# Patient Record
Sex: Female | Born: 1935 | Race: White | Hispanic: No | Smoking: Former smoker
Health system: Southern US, Community
[De-identification: ages and names within clinical notes are randomized; demographics above are authoritative.]

## PROBLEM LIST (undated history)

## (undated) DIAGNOSIS — I119 Hypertensive heart disease without heart failure: Secondary | ICD-10-CM

## (undated) DIAGNOSIS — D591 Autoimmune hemolytic anemia, unspecified: Secondary | ICD-10-CM

## (undated) DIAGNOSIS — I10 Essential (primary) hypertension: Secondary | ICD-10-CM

## (undated) DIAGNOSIS — E039 Hypothyroidism, unspecified: Secondary | ICD-10-CM

## (undated) DIAGNOSIS — R918 Other nonspecific abnormal finding of lung field: Secondary | ICD-10-CM

## (undated) DIAGNOSIS — M4800 Spinal stenosis, site unspecified: Secondary | ICD-10-CM

## (undated) DIAGNOSIS — E669 Obesity, unspecified: Secondary | ICD-10-CM

## (undated) DIAGNOSIS — R0600 Dyspnea, unspecified: Secondary | ICD-10-CM

## (undated) DIAGNOSIS — D375 Neoplasm of uncertain behavior of rectum: Secondary | ICD-10-CM

## (undated) DIAGNOSIS — M5136 Other intervertebral disc degeneration, lumbar region: Secondary | ICD-10-CM

## (undated) DIAGNOSIS — H3411 Central retinal artery occlusion, right eye: Secondary | ICD-10-CM

## (undated) DIAGNOSIS — I350 Nonrheumatic aortic (valve) stenosis: Secondary | ICD-10-CM

## (undated) DIAGNOSIS — I4891 Unspecified atrial fibrillation: Secondary | ICD-10-CM

## (undated) DIAGNOSIS — M51369 Other intervertebral disc degeneration, lumbar region without mention of lumbar back pain or lower extremity pain: Secondary | ICD-10-CM

## (undated) DIAGNOSIS — E785 Hyperlipidemia, unspecified: Secondary | ICD-10-CM

## (undated) DIAGNOSIS — D62 Acute posthemorrhagic anemia: Secondary | ICD-10-CM

## (undated) DIAGNOSIS — I2510 Atherosclerotic heart disease of native coronary artery without angina pectoris: Secondary | ICD-10-CM

## (undated) DIAGNOSIS — M519 Unspecified thoracic, thoracolumbar and lumbosacral intervertebral disc disorder: Secondary | ICD-10-CM

## (undated) HISTORY — DX: Unspecified atrial fibrillation: I48.91

## (undated) HISTORY — DX: Hypertensive heart disease without heart failure: I11.9

## (undated) HISTORY — DX: Other intervertebral disc degeneration, lumbar region: M51.36

## (undated) HISTORY — DX: Nonrheumatic aortic (valve) stenosis: I35.0

## (undated) HISTORY — PX: LUMBAR LAMINECTOMY: SHX95

## (undated) HISTORY — DX: Dyspnea, unspecified: R06.00

## (undated) HISTORY — DX: Other intervertebral disc degeneration, lumbar region without mention of lumbar back pain or lower extremity pain: M51.369

## (undated) HISTORY — PX: HERNIA REPAIR: SHX51

## (undated) HISTORY — DX: Hyperlipidemia, unspecified: E78.5

## (undated) HISTORY — PX: CHOLECYSTECTOMY: SHX55

## (undated) HISTORY — DX: Central retinal artery occlusion, right eye: H34.11

## (undated) HISTORY — DX: Essential (primary) hypertension: I10

## (undated) HISTORY — DX: Autoimmune hemolytic anemia, unspecified: D59.10

## (undated) HISTORY — DX: Hypothyroidism, unspecified: E03.9

## (undated) HISTORY — DX: Obesity, unspecified: E66.9

## (undated) HISTORY — DX: Unspecified thoracic, thoracolumbar and lumbosacral intervertebral disc disorder: M51.9

## (undated) HISTORY — DX: Spinal stenosis, site unspecified: M48.00

---

## 2000-10-18 ENCOUNTER — Encounter: Payer: Self-pay | Admitting: Emergency Medicine

## 2000-10-18 ENCOUNTER — Emergency Department (HOSPITAL_COMMUNITY): Admission: EM | Admit: 2000-10-18 | Discharge: 2000-10-18 | Payer: Self-pay | Admitting: Emergency Medicine

## 2001-04-10 ENCOUNTER — Other Ambulatory Visit: Admission: RE | Admit: 2001-04-10 | Discharge: 2001-04-10 | Payer: Self-pay | Admitting: Obstetrics and Gynecology

## 2001-10-17 ENCOUNTER — Encounter: Payer: Self-pay | Admitting: Surgery

## 2001-10-17 ENCOUNTER — Encounter: Admission: RE | Admit: 2001-10-17 | Discharge: 2001-10-17 | Payer: Self-pay | Admitting: Surgery

## 2002-03-31 ENCOUNTER — Encounter: Payer: Self-pay | Admitting: Surgery

## 2002-03-31 ENCOUNTER — Encounter: Admission: RE | Admit: 2002-03-31 | Discharge: 2002-03-31 | Payer: Self-pay | Admitting: Surgery

## 2002-04-14 ENCOUNTER — Ambulatory Visit (HOSPITAL_COMMUNITY): Admission: RE | Admit: 2002-04-14 | Discharge: 2002-04-14 | Payer: Self-pay | Admitting: Gastroenterology

## 2003-04-19 ENCOUNTER — Encounter: Admission: RE | Admit: 2003-04-19 | Discharge: 2003-04-19 | Payer: Self-pay | Admitting: Surgery

## 2003-04-19 ENCOUNTER — Encounter: Payer: Self-pay | Admitting: Surgery

## 2003-04-30 ENCOUNTER — Other Ambulatory Visit: Admission: RE | Admit: 2003-04-30 | Discharge: 2003-04-30 | Payer: Self-pay | Admitting: Obstetrics and Gynecology

## 2004-04-20 ENCOUNTER — Encounter: Admission: RE | Admit: 2004-04-20 | Discharge: 2004-04-20 | Payer: Self-pay | Admitting: Family Medicine

## 2004-09-10 ENCOUNTER — Encounter: Admission: RE | Admit: 2004-09-10 | Discharge: 2004-09-10 | Payer: Self-pay | Admitting: Family Medicine

## 2004-09-15 ENCOUNTER — Encounter: Admission: RE | Admit: 2004-09-15 | Discharge: 2004-09-15 | Payer: Self-pay | Admitting: Family Medicine

## 2005-04-17 ENCOUNTER — Ambulatory Visit (HOSPITAL_COMMUNITY): Admission: RE | Admit: 2005-04-17 | Discharge: 2005-04-17 | Payer: Self-pay | Admitting: Family Medicine

## 2005-04-17 ENCOUNTER — Encounter: Admission: RE | Admit: 2005-04-17 | Discharge: 2005-04-17 | Payer: Self-pay | Admitting: Family Medicine

## 2005-05-01 ENCOUNTER — Encounter: Admission: RE | Admit: 2005-05-01 | Discharge: 2005-05-01 | Payer: Self-pay | Admitting: Obstetrics and Gynecology

## 2005-05-09 ENCOUNTER — Other Ambulatory Visit: Admission: RE | Admit: 2005-05-09 | Discharge: 2005-05-09 | Payer: Self-pay | Admitting: Obstetrics and Gynecology

## 2006-05-06 ENCOUNTER — Encounter: Admission: RE | Admit: 2006-05-06 | Discharge: 2006-05-06 | Payer: Self-pay | Admitting: Obstetrics and Gynecology

## 2006-12-24 HISTORY — PX: PARTIAL COLECTOMY: SHX5273

## 2007-05-08 ENCOUNTER — Encounter: Admission: RE | Admit: 2007-05-08 | Discharge: 2007-05-08 | Payer: Self-pay | Admitting: Family Medicine

## 2007-06-25 ENCOUNTER — Encounter (INDEPENDENT_AMBULATORY_CARE_PROVIDER_SITE_OTHER): Payer: Self-pay | Admitting: Surgery

## 2007-06-25 ENCOUNTER — Inpatient Hospital Stay (HOSPITAL_COMMUNITY): Admission: RE | Admit: 2007-06-25 | Discharge: 2007-07-02 | Payer: Self-pay | Admitting: Urology

## 2007-07-11 ENCOUNTER — Inpatient Hospital Stay (HOSPITAL_COMMUNITY): Admission: EM | Admit: 2007-07-11 | Discharge: 2007-07-14 | Payer: Self-pay | Admitting: Emergency Medicine

## 2008-01-23 ENCOUNTER — Encounter: Admission: RE | Admit: 2008-01-23 | Discharge: 2008-01-23 | Payer: Self-pay | Admitting: Surgery

## 2008-02-17 ENCOUNTER — Ambulatory Visit (HOSPITAL_COMMUNITY): Admission: RE | Admit: 2008-02-17 | Discharge: 2008-02-18 | Payer: Self-pay | Admitting: Surgery

## 2008-05-11 ENCOUNTER — Encounter: Admission: RE | Admit: 2008-05-11 | Discharge: 2008-05-11 | Payer: Self-pay | Admitting: Family Medicine

## 2008-12-21 ENCOUNTER — Encounter: Admission: RE | Admit: 2008-12-21 | Discharge: 2008-12-21 | Payer: Self-pay | Admitting: Family Medicine

## 2009-06-07 ENCOUNTER — Encounter: Admission: RE | Admit: 2009-06-07 | Discharge: 2009-06-07 | Payer: Self-pay | Admitting: Obstetrics and Gynecology

## 2009-10-22 ENCOUNTER — Encounter
Admission: RE | Admit: 2009-10-22 | Discharge: 2009-10-22 | Payer: Self-pay | Admitting: Physical Medicine and Rehabilitation

## 2010-02-14 ENCOUNTER — Ambulatory Visit (HOSPITAL_COMMUNITY): Admission: RE | Admit: 2010-02-14 | Discharge: 2010-02-14 | Payer: Self-pay | Admitting: Orthopedic Surgery

## 2010-03-08 ENCOUNTER — Inpatient Hospital Stay (HOSPITAL_COMMUNITY): Admission: RE | Admit: 2010-03-08 | Discharge: 2010-03-10 | Payer: Self-pay | Admitting: Orthopedic Surgery

## 2010-06-13 ENCOUNTER — Encounter: Admission: RE | Admit: 2010-06-13 | Discharge: 2010-06-13 | Payer: Self-pay | Admitting: Family Medicine

## 2011-01-14 ENCOUNTER — Encounter: Payer: Self-pay | Admitting: Family Medicine

## 2011-01-20 ENCOUNTER — Inpatient Hospital Stay (HOSPITAL_COMMUNITY)
Admission: EM | Admit: 2011-01-20 | Discharge: 2011-01-24 | DRG: 378 | Disposition: A | Discharge status: Home / Self Care | Payer: Medicare Other | Attending: Internal Medicine | Admitting: Internal Medicine

## 2011-01-20 DIAGNOSIS — G8929 Other chronic pain: Secondary | ICD-10-CM | POA: Diagnosis present

## 2011-01-20 DIAGNOSIS — R5381 Other malaise: Secondary | ICD-10-CM | POA: Diagnosis present

## 2011-01-20 DIAGNOSIS — I10 Essential (primary) hypertension: Secondary | ICD-10-CM | POA: Diagnosis present

## 2011-01-20 DIAGNOSIS — R000 Tachycardia, unspecified: Secondary | ICD-10-CM | POA: Diagnosis present

## 2011-01-20 DIAGNOSIS — K922 Gastrointestinal hemorrhage, unspecified: Principal | ICD-10-CM | POA: Diagnosis present

## 2011-01-20 DIAGNOSIS — D62 Acute posthemorrhagic anemia: Secondary | ICD-10-CM | POA: Diagnosis present

## 2011-01-20 DIAGNOSIS — E039 Hypothyroidism, unspecified: Secondary | ICD-10-CM | POA: Diagnosis present

## 2011-01-20 LAB — DIFFERENTIAL
Basophils Absolute: 0 10*3/uL (ref 0.0–0.1)
Basophils Relative: 0 % (ref 0–1)
Eosinophils Absolute: 0.1 10*3/uL (ref 0.0–0.7)
Eosinophils Relative: 1 % (ref 0–5)
Lymphocytes Relative: 12 % (ref 12–46)
Lymphs Abs: 0.9 10*3/uL (ref 0.7–4.0)
Monocytes Absolute: 0.8 10*3/uL (ref 0.1–1.0)
Monocytes Relative: 11 % (ref 3–12)
Neutro Abs: 5.8 10*3/uL (ref 1.7–7.7)
Neutrophils Relative %: 75 % (ref 43–77)

## 2011-01-20 LAB — CBC
HCT: 23.8 % — ABNORMAL LOW (ref 36.0–46.0)
Hemoglobin: 7.5 g/dL — ABNORMAL LOW (ref 12.0–15.0)
MCH: 27.7 pg (ref 26.0–34.0)
MCHC: 31.5 g/dL (ref 30.0–36.0)
MCV: 87.8 fL (ref 78.0–100.0)
Platelets: 216 10*3/uL (ref 150–400)
RBC: 2.71 MIL/uL — ABNORMAL LOW (ref 3.87–5.11)
RDW: 18.5 % — ABNORMAL HIGH (ref 11.5–15.5)
WBC: 7.6 10*3/uL (ref 4.0–10.5)

## 2011-01-20 LAB — COMPREHENSIVE METABOLIC PANEL
ALT: 15 U/L (ref 0–35)
AST: 17 U/L (ref 0–37)
Albumin: 4.1 g/dL (ref 3.5–5.2)
Alkaline Phosphatase: 47 U/L (ref 39–117)
BUN: 15 mg/dL (ref 6–23)
CO2: 27 mEq/L (ref 19–32)
Calcium: 9.4 mg/dL (ref 8.4–10.5)
Chloride: 94 mEq/L — ABNORMAL LOW (ref 96–112)
Creatinine, Ser: 0.69 mg/dL (ref 0.4–1.2)
GFR calc Af Amer: >60 mL/min (ref >60)
GFR calc non Af Amer: >60 mL/min (ref >60)
Glucose, Bld: 119 mg/dL — ABNORMAL HIGH (ref 70–99)
Potassium: 3.6 mEq/L (ref 3.5–5.1)
Sodium: 130 mEq/L — ABNORMAL LOW (ref 135–145)
Total Bilirubin: 1.3 mg/dL — ABNORMAL HIGH (ref 0.3–1.2)
Total Protein: 5.7 g/dL — ABNORMAL LOW (ref 6.0–8.3)

## 2011-01-20 LAB — OCCULT BLOOD, POC DEVICE: Fecal Occult Bld: POSITIVE

## 2011-01-20 LAB — PROTIME-INR
INR: 0.98 (ref 0.00–1.49)
Prothrombin Time: 13.2 seconds (ref 11.6–15.2)

## 2011-01-20 LAB — APTT: aPTT: 30 seconds (ref 24–37)

## 2011-01-21 LAB — BASIC METABOLIC PANEL
BUN: 14 mg/dL (ref 6–23)
CO2: 27 mEq/L (ref 19–32)
Calcium: 8.9 mg/dL (ref 8.4–10.5)
Chloride: 97 mEq/L (ref 96–112)
Creatinine, Ser: 0.78 mg/dL (ref 0.4–1.2)
GFR calc Af Amer: >60 mL/min (ref >60)
GFR calc non Af Amer: >60 mL/min (ref >60)
Glucose, Bld: 98 mg/dL (ref 70–99)
Potassium: 4.1 mEq/L (ref 3.5–5.1)
Sodium: 133 mEq/L — ABNORMAL LOW (ref 135–145)

## 2011-01-21 LAB — DIFFERENTIAL
Basophils Absolute: 0 10*3/uL (ref 0.0–0.1)
Basophils Relative: 0 % (ref 0–1)
Eosinophils Absolute: 0.2 10*3/uL (ref 0.0–0.7)
Eosinophils Relative: 3 % (ref 0–5)
Lymphocytes Relative: 20 % (ref 12–46)
Lymphs Abs: 1.2 10*3/uL (ref 0.7–4.0)
Monocytes Absolute: 0.9 10*3/uL (ref 0.1–1.0)
Monocytes Relative: 15 % — ABNORMAL HIGH (ref 3–12)
Neutro Abs: 3.6 10*3/uL (ref 1.7–7.7)
Neutrophils Relative %: 62 % (ref 43–77)

## 2011-01-21 LAB — TSH: TSH: 2.526 u[IU]/mL (ref 0.350–4.500)

## 2011-01-21 LAB — CBC
HCT: 21.6 % — ABNORMAL LOW (ref 36.0–46.0)
Hemoglobin: 6.9 g/dL — CL (ref 12.0–15.0)
MCH: 27.7 pg (ref 26.0–34.0)
MCHC: 31.9 g/dL (ref 30.0–36.0)
MCV: 86.7 fL (ref 78.0–100.0)
Platelets: 197 10*3/uL (ref 150–400)
RBC: 2.49 MIL/uL — ABNORMAL LOW (ref 3.87–5.11)
RDW: 18.7 % — ABNORMAL HIGH (ref 11.5–15.5)
WBC: 5.9 10*3/uL (ref 4.0–10.5)

## 2011-01-21 LAB — VITAMIN B12: Vitamin B-12: 537 pg/mL (ref 211–911)

## 2011-01-21 LAB — IRON AND TIBC
Iron: 25 ug/dL — ABNORMAL LOW (ref 42–135)
Saturation Ratios: 5 % — ABNORMAL LOW (ref 20–55)
TIBC: 459 ug/dL (ref 250–470)
UIBC: 434 ug/dL

## 2011-01-21 LAB — FOLATE: Folate: >20 ng/mL

## 2011-01-21 LAB — FERRITIN: Ferritin: 11 ng/mL (ref 10–291)

## 2011-01-22 LAB — BASIC METABOLIC PANEL
BUN: 6 mg/dL (ref 6–23)
CO2: 24 mEq/L (ref 19–32)
Calcium: 8.7 mg/dL (ref 8.4–10.5)
Chloride: 103 mEq/L (ref 96–112)
Creatinine, Ser: 0.62 mg/dL (ref 0.4–1.2)
GFR calc Af Amer: >60 mL/min (ref >60)
GFR calc non Af Amer: >60 mL/min (ref >60)
Glucose, Bld: 104 mg/dL — ABNORMAL HIGH (ref 70–99)
Potassium: 3.7 mEq/L (ref 3.5–5.1)
Sodium: 136 mEq/L (ref 135–145)

## 2011-01-22 LAB — CBC
HCT: 22.7 % — ABNORMAL LOW (ref 36.0–46.0)
Hemoglobin: 7.1 g/dL — ABNORMAL LOW (ref 12.0–15.0)
MCH: 27.3 pg (ref 26.0–34.0)
MCHC: 31.3 g/dL (ref 30.0–36.0)
MCV: 87.3 fL (ref 78.0–100.0)
Platelets: 180 10*3/uL (ref 150–400)
RBC: 2.6 MIL/uL — ABNORMAL LOW (ref 3.87–5.11)
RDW: 18.3 % — ABNORMAL HIGH (ref 11.5–15.5)
WBC: 5.8 10*3/uL (ref 4.0–10.5)

## 2011-01-23 DIAGNOSIS — D649 Anemia, unspecified: Secondary | ICD-10-CM

## 2011-01-23 DIAGNOSIS — K922 Gastrointestinal hemorrhage, unspecified: Secondary | ICD-10-CM

## 2011-01-23 LAB — RETICULOCYTES
RBC.: 3.87 MIL/uL (ref 3.87–5.11)
Retic Count, Absolute: 363.8 10*3/uL — ABNORMAL HIGH (ref 19.0–186.0)
Retic Ct Pct: 9.4 % — ABNORMAL HIGH (ref 0.4–3.1)

## 2011-01-23 LAB — BILIRUBIN, FRACTIONATED(TOT/DIR/INDIR)
Bilirubin, Direct: 0.3 mg/dL (ref 0.0–0.3)
Indirect Bilirubin: 0.7 mg/dL (ref 0.3–0.9)
Total Bilirubin: 1 mg/dL (ref 0.3–1.2)

## 2011-01-23 LAB — TECHNOLOGIST SMEAR REVIEW

## 2011-01-23 LAB — DIRECT ANTIGLOBULIN TEST (NOT AT ARMC)
DAT, IgG: POSITIVE
DAT, complement: POSITIVE

## 2011-01-23 LAB — LACTATE DEHYDROGENASE: LDH: 185 U/L (ref 94–250)

## 2011-01-24 LAB — CROSSMATCH
ABO/RH(D): O POS
Antibody Screen: POSITIVE
DAT, IgG: POSITIVE
Unit division: 0
Unit division: 0
Unit division: 0
Unit division: 0

## 2011-01-24 LAB — HAPTOGLOBIN: Haptoglobin: 41 mg/dL (ref 16–200)

## 2011-01-25 NOTE — H&P (Signed)
Carol [name], Carol NO.:  [id number]  MEDICAL RECORD NO.:  [id number]          PATIENT TYPE:  INP  LOCATION:  1826                         FACILITY:  [facility]  PHYSICIAN:  Mariea Stable, MD   DATE OF BIRTH:  03-Feb-1928  DATE OF ADMISSION:  01/20/2011 DATE OF DISCHARGE:                             HISTORY & PHYSICAL   PRIMARY CARE PHYSICIAN:  Dr. Chesley Mires from [place].  PRIMARY GASTROENTEROLOGIST:  Everardo All. Madilyn Fireman, MD  CHIEF COMPLAINT:  Fatigue, melena, and anemia.  HISTORY OF PRESENT ILLNESS:  Carol [name] is a very pleasant 77 year old woman with history significant for colonic polyps status post removal and a partial colon resection for a tubular adenoma who presents with chief complaint of 50.  She reports that symptoms have been ongoing for approximately 3-4 days.  She went to her primary care physician for further evaluation yesterday.  The patient states that her fatigue is accompanied by having "no energy."  Upon further questioning, she states that she has had some black, tarry-appearing stools for approximately 4 days.  She has also had some dyspnea on exertion, which is new for her for few days as well.  She has never had any symptoms like this before. Today, the patient was called by her primary care physician to have her come to the Emergency Department for further evaluation because her hemoglobin was noted to be 7.7 on a complete blood count done day prior to admission in the PCPs office.  The patient reports having had taken cruise to the [place] and she was for 1 week and returned a week ago.  At that time, however she did not have any abdominal complaints during this trip.  Furthermore, she denies having had any fever, chills, nausea, vomiting, abdominal pain, diarrhea, or hematochezia.  The patient reports that her last colonoscopy was about 2 years ago with Dr. Madilyn Fireman and was told that it was normal.  PAST MEDICAL HISTORY: 1. History of  colonic polyps status post removal including a villous     tumor of the rectum that was excised via exploratory laparotomy. 2. Hypertension. 3. Hyperlipidemia. 4. Hypothyroidism. 5. Back pain status post L3 through L5 lumbar decompression for spinal     stenosis in March 2011 by Dr. Shon Baton. 6. History of pelvic abscess status post I and D. 7. History of lysis of adhesions.  MEDICATIONS: 1. Amlodipine 5 mg p.o. daily. 2. Tylenol Extra Strength 1 tablet p.o. p.r.n. 3. Lipitor 20 mg p.o. daily. 4. Losartan -- HCTZ 100 -- 25 mg p.o. daily. 5. Synthroid 75 mcg p.o. daily. 6. Probiotic 1 capsule p.o. daily. 7. Vitamin D 400 units 1 capsule p.o. daily. 8. Gabapentin 300 mg p.o. t.i.d. 9. Lupin 6 mg 1 capsule p.o. daily. 10.Vicodin 10 -- 325 one tablet p.o. p.r.n. (the patient is not taking     any more). 11.Ocuvite -- lutein 1 tablet p.o. daily. 12.Vitamin C 500 mg p.o. daily. 13.Stool softener 20/40 mg p.o. daily p.r.n. constipation. 14.Fish oil 1200 mg p.o. daily. 15.Calcium 600 mg p.o. b.i.d. 16.Aspirin 81 mg p.o. daily.  ALLERGIES:  No known drug allergies.  SOCIAL HISTORY:  The patient  lives in [place] with her husband for the past 11 years.  Previously lived in [place], [place].  She also has a daughter that lives in [place] and is at bedside along with her husband.  She also has few other sons and daughters that live out in [place], [place], and [place].  She reports remote tobacco use having quit greater than 25 years ago.  She also endorses 1 or 2 glasses of wine with dinner regularly.  She denies any history of drug use.  FAMILY HISTORY:  Noncontributory.  REVIEW OF SYSTEMS:  As per HPI.  All others reviewed and negative.  PHYSICAL EXAMINATION:  VITAL SIGNS:  Temperature 97.6, blood pressure 140/72, heart rate 113, respirations 20, and oxygen saturation 100% on room air. GENERAL:  This is a very pleasant elderly woman who appears younger than stated age in  no acute distress. HEENT:  Head is atraumatic and normocephalic.  Pupils are equally round and reactive to light and accommodation.  Extraocular movements are intact.  Sclerae are anicteric.  There is conjunctival pallor.  Mucous membranes are moist with no oropharyngeal lesions. NECK:  Supple.  There is no thyromegaly.  There are no bruits, although there is a systolic murmur that is appreciable in bilateral carotids. There is no JVD. LUNGS:  Clear to auscultation bilaterally. HEART:  Has a tachy rate with regular rhythm.  There is a grade 3/6 systolic ejection murmur with radiation to bilateral carotids consistent with a aortic stenosis.  There is also a diminished S1, S2. ABDOMEN:  There is positive bowel sounds, soft, nontender, nondistended with no organomegaly. RECTAL:  Deferred as this was already done by the EP physician and reported to be normal with heme-positive stool. EXTREMITIES:  There is no cyanosis, clubbing, or edema. NEUROLOGIC:  The patient is awake, alert, and oriented and cranial nerves II through XII are grossly intact.  Motor 5/5 in x4 extremities. Sensation is intact.  LABORATORY DATA:  Fecal occult blood test positive.  WBCs 7.6, hemoglobin 7.5 with an MCV of 87.8, and platelet count 216.  Sodium 130, potassium 3.6, chloride 94, bicarb 27, glucose 119, BUN 15, creatinine 0.69, bilirubin 1.3, alkaline phosphatase 47, AST 17, ALT 15, protein 5.7, albumin 4.1, and calcium 9.4.  ASSESSMENT/PLAN: 1. Fatigue with melanotic stool and dyspnea on exertion.  This is most     likely secondary to an upper gastrointestinal bleed.  The patient     does report having used high dose of NSAIDs for her back pain,     although she has been off of these for approximately 1 month now.     She does endorse some occasional heartburn, though this is not very     significant.  My assumption is that with the melanic stools along     with a BUN to creatinine ratio of approximately 22  and recent     colonoscopies that have not shown any significant findings that is     most likely source of upper GI in origin.  Unclear if this is     secondary to hemorrhagic gastritis versus potential peptic ulcer     disease.  At this point, will admit to a regular floor, obtained 2     large bore IVs, check orthostatics, hydrate with normal saline,     type and cross and transfuse 2 units of packed red cells given a     hemoglobin of 7.5 and symptoms of fatigue and dyspnea on exertion.  We will also consult GI (primary GI is Dr. Madilyn Fireman) for possible EGD     in the morning.  Obviously, if EGD is negative, assume that we     would proceed with colonoscopy plus or minus capsule endoscopy if     the above are negative.  The patient should avoid NSAIDs, again she     has not been using these for greater than 1 month.  We would also     advise against alcohol if gastritis is present until resolved.     Less likely possibilities include arteriovenous malformations of     the stomach or small bowel as well as occult malignancies. 2. Hyponatremia, this is likely to volume depletion given fatigue with     melanotic stool and dyspnea on exertion.  We will hydrate with     normal saline and monitor. 3. Anemia again secondary to fatigue with melanotic stool and dyspnea     on exertion, this is normocytic.  The patient has had normal     hemoglobins in the past.  Occasional anemia related to surgery.  We     will go ahead and check an anemia panel, although as mentioned this     is secondary to fatigue with melanotic stool and dyspnea on     exertion and nothing else. 4. Tachycardia, again secondary to fatigue with melanotic stool and     dyspnea on exertion.  We will hold on antihypertensives at this     point.  We will hydrate and check orthostatics and follow along. 5. Hyperlipidemia.  We will hold on medications for right now and we     will discharge on home regimen. 6. Hypothyroidism.   We will continue with the patient's Synthroid and     check TSH to ensure that she is euthyroid especially in light and     tachycardia, although this is presumed to be secondary to fatigue     with melanotic stool and dyspnea on exertion. 7. Chronic pain.  The patient currently only taking gabapentin and     p.r.n. Tylenol.  We will continue with this regimen.     Mariea Stable, MD     MA/MEDQ  D:  01/20/2011  T:  01/20/2011  Job:  [id number]  cc:   Dr. Dalbert Batman C. Madilyn Fireman, M.D.  Electronically Signed by Mariea Stable MD on 01/25/2011 02:29:16 PM

## 2011-01-29 NOTE — Discharge Summary (Addendum)
NAMEMAHARI, VANKIRK NO.:  [id number]  MEDICAL RECORD NO.:  [id number]          PATIENT TYPE:  INP  LOCATION:  5522                         FACILITY:  [facility]  PHYSICIAN:  Lonia Blood, M.D.       DATE OF BIRTH:  1927/12/26  DATE OF ADMISSION:  01/20/2011 DATE OF DISCHARGE:  01/24/2011                              DISCHARGE SUMMARY   PRIMARY CARE PROVIDER:  Duncan Dull, MD  ATTENDING PHYSICIAN WHILE HOSPITALIZED:  Brendia Sacks, MD  DISCHARGE DIAGNOSES: 1. Gastrointestinal bleed with anemia/dyspnea on exertion/tachycardia. 2. Acute blood loss anemia versus subacute slow blood loss. 3. Hypothyroidism. 4. Hypertension. 5. Chronic pain.  DISCHARGE MEDICATIONS: 1. Amlodipine 5 mg p.o. 1 tablet daily. 2. Calcium carbonate/vitamin D 1 tablet p.o. b.i.d. 3. Fish oil 1000 mg p.o. 1 tablet daily. 4. Losartan/hydrochlorothiazide 50/12.5 one tablet p.o. daily. 5. Lutein 6 mg 1 tablet daily. 6. Multivitamin p.o. 1 tablet daily. 7. Neurontin 300 mg 1 tablet t.i.d. 8. Ocuvite 1 tablet p.o. daily. 9. Simvastatin 40 mg p.o. 1 tablet daily at bedtime. 10.Stool softener 250 mg p.o. daily. 11.Synthroid 75 mcg 1 tablet daily. 12.Tylenol Extra Strength 500 mg 2 tablets q.6 h. as needed for pain.  PROCEDURES: 1. Endoscopy by Dr. Madilyn Fireman on January 21, 2011, yielded normal results. 2. Colonoscopy by Dr. Madilyn Fireman on January 22, 2011, yielded normal     colonoscopy with evidence of previous rectosigmoid surgery.  LABORATORY DATA:  FOBT positive.  Hemoglobin 7.5, hematocrit 23.8, platelets 216.  Sodium 130 on admission, up to 136; potassium 3.6, chloride 94, CO2 of 27, BUN 15, creatinine 0.69, glucose 119.  Total bili 1.3, total protein 5.7.  PT 13.2, INR 0.98, PTT 30.  TSH 2.52. Iron 25, total iron-binding capacity 459, percent saturation 5, ferritin 11.  LDH 185, fractionated total bili 1.0, direct bili 0.3, indirect bili 0.7.  Direct antiglobulin IgG positive, DAT  complement positive. Retic percentage 9.4, RBCs 3.8, absolute reticulocytes 3.63.  Smear yields polychromatophilia present, teardrop cells, platelet clumps, rare basophilic stippling, haptoglobin 41.  CONSULTS: 1. John C. Madilyn Fireman, MD with GI who did the EGD and the colonoscopy, both     of which were within normal limits. 2. Blenda Nicely. Shadad from Hematology consulted to rule out autoimmune     hemolytic anemia.  Recommended that her anemia is likely secondary     to iron deficiency due to GI blood loss.  Doubt is that the patient     had clinically brisk hemolysis.  Transfusions were withheld and IV     iron, Venofer recommended.  BRIEF SUMMARY:  Ms. Consoli is a very pleasant 77 year old female with a history of colonic polyps, presented to the [facility] ED with chief complaint of dyspnea on exertion.  States she had been having these symptoms for 3 or 4 days and went to her primary care provider and upon further evaluation in PCP's office, she reported that she also has had Kellyn Mccary tarry stools, fatigue as well.  She indicated she never had these symptoms before, she does use NSAIDs secondary to chronic back pain, but had stopped taking those for the last 30 days.  Workup in the ED yielded a hemoglobin of 7.7.  She was admitted for further evaluation of GI bleed.  HOSPITAL COURSE BY PROBLEM: 1. GI bleed with anemia/tachycardia/dyspnea on exertion.  EGD and     colonoscopy negative for active bleeding.  Hemoglobin remained     stable during hospitalization with a range of 7.1-7.5.  Tachycardia     resolved.  The patient was planned to have a transfusion, but found     to have warm body antibodies and any blood match would be "least     compatible," i.e., emergency release only.  At this point,     Hematology consult was obtained.  Recommendation was the patient     given IV iron.  The patient does continue to have very slight     dyspnea on exertion, but is otherwise stable.  The patient  will     follow up with Dr. Clelia Croft at the cancer center in 2 weeks. 2. Anemia secondary to GI bleed with anemia/tachycardia/dyspnea on     exertion.  See above.  Currently stable.  Followup appointment with     Dr. Clelia Croft in 2 weeks.  Also has an appointment with her primary     care provider in 2 weeks. 3. Hypothyroidism, stable during hospitalization.  Continue Synthroid. 4. Hypertension, stable during hospitalization, antihypertensives     initially held.  Will go home on same medications and follow up     with PCP. 5. Chronic pain.  The patient continued on her gabapentin.  No     aspirin, no NSAIDs, using Tylenol as needed.  PHYSICAL EXAMINATION:  Documented in rounding note dated January 24, 2011.  FOLLOWUP:  The patient has an appointment already with primary care provider, Dr. Shaune Pollack in 2 weeks.  Dr. Alver Fisher office will call the patient for follow-up appointment within 2 weeks.  CONDITION ON DISCHARGE:  Stable.  TIME SPENT:  Forty five minutes.    Gwenyth Bender, NP   ______________________________ Lonia Blood, M.D.   KMB/MEDQ  D:  01/24/2011  T:  01/24/2011  Job:  [id number]  cc:   Duncan Dull, M.D.Electronically Signed by Lonia Blood M.D. on 01/29/2011 07:38:51 AM Electronically Signed by Toya Smothers  on 02/18/2011 08:27:33 AM

## 2011-02-02 NOTE — Consult Note (Signed)
Carol [name], CROSSLEY           ACCOUNT NO.:  [id number]  MEDICAL RECORD NO.:  [id number]          PATIENT TYPE:  INP  LOCATION:  5522                         FACILITY:  [facility]  PHYSICIAN:  Marshal Eskew N. Chantale Leugers        DATE OF BIRTH:  [birth year]  DATE OF CONSULTATION:  01/23/2011 DATE OF DISCHARGE:                                CONSULTATION   REASON FOR CONSULTATION:  Rule out to autoimmune hemolytic anemia.  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 Admission hemoglobin was 7.5, with a repeat hemoglobin of 7.7.  She had normal white blood cells and platelets.  Hemoccult was positive. Therefore, GI consult was requested, and EGD and colonoscopy were performed, both negative to explain occult source of bleeding.  These procedures were performed between January 29 and January 22, 2011. Ferritin was 11 with an iron level of 25, and percentage saturation of 5.  Her hemoglobin on January 29 had dropped to 6.9 and 21.6 respectively.  On January 22, 2011 was 7.1 and 22.7 respectively.  She was to receive blood transfusion, but the blood bank called secondary to the finding of a warm antibody IgG, and if transfusion occur, had to be performed with the least incompatible blood sample.  The patient never had a transfusion before that she knows of.  We were asked to see her with recommendations, since her hemoglobin and hematocrit are very low. Peripheral smear has been reviewed by Dr. Clelia Croft, showing very limited spherocytes, polychromasia, and microcytes.  No other abnormal findings. Retic count, LDH, and haptoglobin are pending.  We were asked to see her in consultation with recommendations.  PAST MEDICAL  HISTORY: 1. Hypothyroidism. 2. Hyperlipidemia. 3. Hypertension. 4. History of colonic polyps and tubulovillous adenoma. 5. Osteoarthritis.  PAST SURGICAL HISTORY: 1. Status post L-spine surgery, at L3-L5, with decompression on March 08, 2010. 2. Status post pelvic abscess excision. 3. Status post abdominal hernia repair with lysis of adhesions in     [place]. 4. 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 Jamey Ripa, July 2008. 5. Status post cystoscopy, July 2008, Dr. Laverle Patter. 6. Status post colectomy with anastomosis, remote, in [place]. 7. Status post repair of right incisional hernia with mesh on February 17, 2008.  ALLERGIES:  ADHESIVE TAPE, paper tape is acceptable.  MEDICATIONS: 1. Neurontin 350 mg t.i.d. 2. Fluvirin x1. 3. Synthroid 75 mcg daily. 4. Protonix 40 mg b.i.d. 5. Tylenol 650 mg q.4 h. p.r.n. 6. Maalox 30 mL q.6 h. p.r.n. 7. Zofran 4 mg IV - p.o. q.6 h. p.r.n. 8. Ambien 5 mg at bedtime p.r.n.  REVIEW OF SYSTEMS:  She denies any fever, chills, or night sweats.  Noheadaches or mental status changes.  No vision changes or dysphagia. She has been dyspneic on exertion for 4 days  prior to admission, but no productive cough or chest pain or palpitations.  No abdominal pain.  No decrease in appetite or weight loss.  She did have increasing fatigue. No GERD symptoms.  No nausea, vomiting, diarrhea, or constipation.  She was experiencing dark tarry stools upon admission, but now that is resolved.  No blood in the urine.  No gum bleed or nose bleeds.  No hemoptysis.  No quinine products.  No ice chips.  She drinks about 3 cups a day of caffeine.  She does take NSAIDS on a regular basis.  She has significant amounts of iced tea.  No starches.  She just returned from a recent trip to [place], returning 1 week ago, and was there for a total of 7 days.  FAMILY HISTORY:  Noncontributory.  SOCIAL HISTORY:  She is married x2, her current  marriage is of about 11 years, she has one daughter in [place], others living in [place], [place], and [place].  The patient lives in [place].  Priorly, she was living in [place], [place].  She is full code.  She quit 25 years ago the use of tobacco.  She drinks 1-2 glass of wine with dinner. No recreational drugs.  PHYSICAL EXAMINATION:  GENERAL:  This is a well-developed, well- nourished 77 year old white female in no acute distress, alert, and oriented x3. VITAL SIGNS:  Blood pressure 114/72, pulse 115, respirations 20, temperature 98.2, and O2 sats 97% on room air. HEENT:  Normocephalic and atraumatic.  Sclerae anicteric.  Oral cavity without thrush or lesions. NECK:  Supple.  No cervical or supraclavicular masses. LUNGS:  Clear to auscultation bilaterally.  No axillary masses. CARDIOVASCULAR:  Regular rate and rhythm with a 2/2 systolic murmur.  No rubs or gallops. ABDOMEN:  Moderately obese, nontender.  Bowel sounds x4.  No hepatosplenomegaly. EXTREMITIES:  Without edema.  No inguinal masses. GU/RECTAL:  Deferred. SKIN:  Without any other areas of lesions, bruising, petechial rash, or jaundice. BREASTS:  Not examined. NEURO:  Nonfocal.  LABORATORY DATA:  Last hemoglobin 7.1, hematocrit 22.7, white count 5.8, platelet 180, and MCV 87.3.  ANC 5.8 for a white count of 7.8 on January 20, 2011, lymphocytes 0.9, and monocytes 0.8.  Iron 25, TIBC 4.9, percentage saturation 5, ferritin 11, folic acid greater than 20, B12 537.  PTT 30, PT 13.2, and INR 0.98.  Sodium 136, potassium 3.7, BUN 6, creatinine 0.62, glucose 104, total bilirubin 1.3, alkaline phosphatase 47, AST 17, ALT 15, total protein 5.7, albumin of 4.1, and calcium 8.7.  IMPRESSION:  [name] is a very pleasant 77 year old white female, asked to see to rule out hemolytic anemia.  The patient has been using long-term NSAIDS.  She was found to have heme-positive stools.  Her endoscopy was negative.  She  also was found to have iron-deficiency anemia.  She was to receive transfusion with packed RBCs, but she was found to have warm antibodies, IgG positive.  The patient is clinically stable, without chest pain or shortness of breath.  She was noted to have a bilirubin of 1.3 and iron of 25 and ferritin of 11, percentage saturation of 5.  Thus the recommendations are: 1. Her anemia is likely secondary to iron deficiency due to     gastrointestinal blood loss.  Doubt that the patient has clinically     brisk hemolysis.  However, checking the LDH, retic count and     fractionated bilirubin will help to determine this situation. 2. Withhold of any transfusion unless she  is hematologically stable.     We would give iron in the form of IV Venofer 250 mg x1. 3. We would follow up on hemolysis indices.  Doubt that she would need     steroids. 4. We will arrange followup at the [facility].  It is all right to     discharge the patient in the morning from our standpoint.  If you     have any questions or concerns, please feel free to call Dr.     Truett Perna, for Dr. Clelia Croft is to be out of office.  Thank you very much for allowing [place] the opportunity to participate in the care of this nice patient.     Marlowe Kays, P.A.______________________________ Blenda Nicely ZOXWRU    SW/MEDQ  D:  01/23/2011  T:  01/24/2011  Job:  [id number]  cc:   Dr. Dalbert Batman C. Madilyn Fireman, M.D.  Electronically Signed by Marlowe Kays P.A. on 01/24/2011 08:48:58 AM Electronically Signed by Eli Hose  on 02/02/2011 09:21:26 AM

## 2011-02-05 ENCOUNTER — Encounter: Payer: Medicare Other | Admitting: Oncology

## 2011-02-05 ENCOUNTER — Other Ambulatory Visit: Payer: Self-pay | Admitting: Oncology

## 2011-02-05 ENCOUNTER — Encounter (HOSPITAL_BASED_OUTPATIENT_CLINIC_OR_DEPARTMENT_OTHER): Payer: Medicare Other | Admitting: Oncology

## 2011-02-05 DIAGNOSIS — D500 Iron deficiency anemia secondary to blood loss (chronic): Secondary | ICD-10-CM

## 2011-02-05 LAB — COMPREHENSIVE METABOLIC PANEL
ALT: 11 U/L (ref 0–35)
AST: 19 U/L (ref 0–37)
Albumin: 4.7 g/dL (ref 3.5–5.2)
Alkaline Phosphatase: 52 U/L (ref 39–117)
BUN: 18 mg/dL (ref 6–23)
CO2: 29 mEq/L (ref 19–32)
Calcium: 9.5 mg/dL (ref 8.4–10.5)
Chloride: 98 mEq/L (ref 96–112)
Creatinine, Ser: 0.69 mg/dL (ref 0.40–1.20)
Glucose, Bld: 98 mg/dL (ref 70–99)
Potassium: 4.3 mEq/L (ref 3.5–5.3)
Sodium: 136 mEq/L (ref 135–145)
Total Bilirubin: 0.9 mg/dL (ref 0.3–1.2)
Total Protein: 5.9 g/dL — ABNORMAL LOW (ref 6.0–8.3)

## 2011-02-05 LAB — CBC WITH DIFFERENTIAL/PLATELET
BASO%: 0.5 % (ref 0.0–2.0)
Basophils Absolute: 0 10*3/uL (ref 0.0–0.1)
EOS%: 2 % (ref 0.0–7.0)
Eosinophils Absolute: 0.1 10*3/uL (ref 0.0–0.5)
HCT: 30.4 % — ABNORMAL LOW (ref 34.8–46.6)
HGB: 10.1 g/dL — ABNORMAL LOW (ref 11.6–15.9)
LYMPH%: 13.5 % — ABNORMAL LOW (ref 14.0–49.7)
MCH: 30 pg (ref 25.1–34.0)
MCHC: 33.5 g/dL (ref 31.5–36.0)
MCV: 89.6 fL (ref 79.5–101.0)
MONO#: 0.5 10*3/uL (ref 0.1–0.9)
MONO%: 8.6 % (ref 0.0–14.0)
NEUT#: 4.5 10*3/uL (ref 1.5–6.5)
NEUT%: 75.4 % (ref 38.4–76.8)
Platelets: 199 10*3/uL (ref 145–400)
RBC: 3.39 10*6/uL — ABNORMAL LOW (ref 3.70–5.45)
RDW: 22.8 % — ABNORMAL HIGH (ref 11.2–14.5)
WBC: 6 10*3/uL (ref 3.9–10.3)
lymph#: 0.8 10*3/uL — ABNORMAL LOW (ref 0.9–3.3)

## 2011-02-05 LAB — CHCC SMEAR

## 2011-02-05 LAB — FERRITIN: Ferritin: 38 ng/mL (ref 10–291)

## 2011-02-05 LAB — LACTATE DEHYDROGENASE: LDH: 154 U/L (ref 94–250)

## 2011-02-05 LAB — IRON AND TIBC
%SAT: 24 % (ref 20–55)
Iron: 101 ug/dL (ref 42–145)
TIBC: 421 ug/dL (ref 250–470)
UIBC: 320 ug/dL

## 2011-02-07 ENCOUNTER — Encounter (HOSPITAL_BASED_OUTPATIENT_CLINIC_OR_DEPARTMENT_OTHER): Payer: Medicare Other | Admitting: Oncology

## 2011-02-07 DIAGNOSIS — D500 Iron deficiency anemia secondary to blood loss (chronic): Secondary | ICD-10-CM

## 2011-03-01 NOTE — Consult Note (Signed)
  Carol [name], Carol [name]           ACCOUNT NO.:  [id number]  MEDICAL RECORD NO.:  [id number]           PATIENT TYPE:  LOCATION:                                 FACILITY:  PHYSICIAN:  Stoney Karczewski C. Madilyn Fireman, M.D.    DATE OF BIRTH:  May 01, 1928  DATE OF CONSULTATION:  01/21/2011 DATE OF DISCHARGE:                                CONSULTATION   REASON FOR CONSULTATION:  Anemia and melena.  HISTORY OF PRESENT ILLNESS:  The patient is an 77 year old white female who presents with a 3- to 4-day history of tarry stools without any abdominal pain, nausea, or vomiting.  She was found to have a hemoglobin of 7.7 the day prior to presentation and 7.5 on admission.  She had a normal BUN of 15 with creatinine of 0.69.  She states she has been taking a lot of Advil for back pain.  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  She has never had an EGD.  PAST MEDICAL HISTORY:  Hypertension, hyperlipidemia, hypothyroidism.  SURGERIES:  Lumbar spine surgery, pelvic abscess, history of lysis of adhesions, and abdominal wound hernia repair.  MEDICATIONS:  Amlodipine, Lipitor, losartan, Synthroid, vitamin D, gabapentin, Vicodin, Ocuvite, 81 mg aspirin.  PHYSICAL EXAMINATION:  GENERAL:  Well-developed, well-nourished white female in no acute distress. HEART:  Regular rate and rhythm without murmur. LUNGS:  Clear. ABDOMEN:  Soft, nondistended with normoactive bowel sounds.  No hepatosplenomegaly, mass, or guarding.  IMPRESSION:  Anemia and dark heme-positive stool, suspect nonsteroidal antiinflammatory drug-related upper gastrointestinal bleed.  PLAN:  We will proceed with EGD today.          ______________________________ Everardo All. Madilyn Fireman, M.D.     JCH/MEDQ  D:  01/21/2011  T:  01/21/2011  Job:  [id number]  Electronically Signed by Dorena Cookey M.D. on 02/27/2011  07:04:43 PM

## 2011-03-01 NOTE — Op Note (Signed)
  NAMETALEIGHA, Carol [name]           ACCOUNT NO.:  [id number]  MEDICAL RECORD NO.:  [id number]          PATIENT TYPE:  INP  LOCATION:  5522                         FACILITY:  [facility]  PHYSICIAN:  Nahsir Venezia C. Madilyn Fireman, M.D.    DATE OF BIRTH:  Apr 26, 1928  DATE OF PROCEDURE:  01/21/2011 DATE OF DISCHARGE:                              OPERATIVE REPORT   INDICATIONS FOR PROCEDURE:  GI bleeding.  SURGEON:  Gloriana Piltz C. Madilyn Fireman, MD  PROCEDURE:  The patient was placed in the left lateral decubitus position and placed on the pulse monitor with continuous low-flow oxygen delivered by nasal cannula.  She was sedated with 50 mcg of IV fentanyl and 3 mg of IV Versed.  The Olympus video endoscope was advanced under direct vision into the oropharynx and esophagus.  The esophagus was straight and of normal caliber at the squamocolumnar line at 38 cm. There was no visible esophagitis, ring stricture, or other abnormality of the GE junction.  The stomach was entered and a small amount of liquid secretions were suctioned from the fundus.  Retroflexed view of the cardia was unremarkable.  The fundus, body, antrum, and pylorus all appeared normal.  The duodenum was entered and both bulb and second portion were inspected and appeared to be within normal limits.  The scope was then withdrawn and the patient returned to the recovery room in stable condition.  She tolerated the procedure well.  There were no immediate complications.  IMPRESSION:  Normal endoscopy.  PLAN:  We will proceed with colonoscopy tomorrow.          ______________________________ Everardo All Madilyn Fireman, M.D.     JCH/MEDQ  D:  01/21/2011  T:  01/21/2011  Job:  [id number]  Electronically Signed by Dorena Cookey M.D. on 02/27/2011 07:04:35 PM

## 2011-03-01 NOTE — Op Note (Signed)
  [name], [name]           ACCOUNT NO.:  [id number]  MEDICAL RECORD NO.:  [id number]          PATIENT TYPE:  INP  LOCATION:  5522                         FACILITY:  [facility]  PHYSICIAN:  Britton Bera C. Madilyn Fireman, M.D.    DATE OF BIRTH:  [birth year]  DATE OF PROCEDURE:  01/22/2011 DATE OF DISCHARGE:                              OPERATIVE REPORT   INDICATION FOR PROCEDURE:  GI bleeding with negative EGD.  PROCEDURE:  The patient was placed in the left lateral decubitus position and placed on pulse monitor with continuous low-flow oxygen delivered by nasal cannula.  She was sedated with 50 mcg of IV fentanyl and 4 mg of IV Versed.  Olympus video colonoscope was inserted into the rectum and advanced to the cecum, confirmed by transillumination of McBurney point and visualization of the ileocecal valve and appendiceal orifice.  The prep was good.  The cecum, ascending, transverse, descending, and sigmoid colon all appeared normal with no masses, polyps, diverticula, or other mucosal abnormalities.  At about 20 cm, there was a colocolonic anastomosis from previous surgery, which appeared intact with no evidence of any residual or recurrent neoplasm and no stricture.  The rectum appeared normal.  Retroflexed view of the anus revealed no obvious internal hemorrhoids.  The scope was then withdrawn and the patient returned to the recovery room in a stable condition.  She tolerated the procedure well and there were no immediate complications.  IMPRESSION:  Normal colonoscopy with evidence of previous rectosigmoid surgery.          ______________________________ Everardo All Madilyn Fireman, M.D.     JCH/MEDQ  D:  01/22/2011  T:  01/22/2011  Job:  [id number]  Electronically Signed by Dorena Cookey M.D. on 02/27/2011 07:04:39 PM

## 2011-03-14 LAB — CBC
HCT: 40.4 % (ref 36.0–46.0)
Hemoglobin: 14 g/dL (ref 12.0–15.0)
MCHC: 34.7 g/dL (ref 30.0–36.0)
MCV: 89.5 fL (ref 78.0–100.0)
Platelets: 194 10*3/uL (ref 150–400)
RBC: 4.51 MIL/uL (ref 3.87–5.11)
RDW: 14.1 % (ref 11.5–15.5)
WBC: 8.3 10*3/uL (ref 4.0–10.5)

## 2011-03-14 LAB — COMPREHENSIVE METABOLIC PANEL
ALT: 31 U/L (ref 0–35)
AST: 33 U/L (ref 0–37)
Albumin: 4.2 g/dL (ref 3.5–5.2)
Alkaline Phosphatase: 53 U/L (ref 39–117)
BUN: 13 mg/dL (ref 6–23)
CO2: 33 mEq/L — ABNORMAL HIGH (ref 19–32)
Calcium: 10.1 mg/dL (ref 8.4–10.5)
Chloride: 96 mEq/L (ref 96–112)
Creatinine, Ser: 0.83 mg/dL (ref 0.4–1.2)
GFR calc Af Amer: >60 mL/min (ref >60)
GFR calc non Af Amer: >60 mL/min (ref >60)
Glucose, Bld: 102 mg/dL — ABNORMAL HIGH (ref 70–99)
Potassium: 3.8 mEq/L (ref 3.5–5.1)
Sodium: 138 mEq/L (ref 135–145)
Total Bilirubin: 0.8 mg/dL (ref 0.3–1.2)
Total Protein: 6.8 g/dL (ref 6.0–8.3)

## 2011-03-18 LAB — CBC
HCT: 33.2 % — ABNORMAL LOW (ref 36.0–46.0)
HCT: 33.8 % — ABNORMAL LOW (ref 36.0–46.0)
HCT: 43.3 % (ref 36.0–46.0)
Hemoglobin: 11.2 g/dL — ABNORMAL LOW (ref 12.0–15.0)
Hemoglobin: 11.4 g/dL — ABNORMAL LOW (ref 12.0–15.0)
Hemoglobin: 14.1 g/dL (ref 12.0–15.0)
MCHC: 32.6 g/dL (ref 30.0–36.0)
MCHC: 33.6 g/dL (ref 30.0–36.0)
MCHC: 33.7 g/dL (ref 30.0–36.0)
MCV: 90.7 fL (ref 78.0–100.0)
MCV: 90.7 fL (ref 78.0–100.0)
MCV: 91.5 fL (ref 78.0–100.0)
Platelets: 151 10*3/uL (ref 150–400)
Platelets: 157 10*3/uL (ref 150–400)
Platelets: 198 10*3/uL (ref 150–400)
RBC: 3.66 MIL/uL — ABNORMAL LOW (ref 3.87–5.11)
RBC: 3.73 MIL/uL — ABNORMAL LOW (ref 3.87–5.11)
RBC: 4.74 MIL/uL (ref 3.87–5.11)
RDW: 14 % (ref 11.5–15.5)
RDW: 14.1 % (ref 11.5–15.5)
RDW: 14.3 % (ref 11.5–15.5)
WBC: 7.2 10*3/uL (ref 4.0–10.5)
WBC: 7.9 10*3/uL (ref 4.0–10.5)
WBC: 8.6 10*3/uL (ref 4.0–10.5)

## 2011-03-18 LAB — COMPREHENSIVE METABOLIC PANEL
ALT: 33 U/L (ref 0–35)
AST: 28 U/L (ref 0–37)
Albumin: 4.6 g/dL (ref 3.5–5.2)
Alkaline Phosphatase: 53 U/L (ref 39–117)
BUN: 21 mg/dL (ref 6–23)
CO2: 30 mEq/L (ref 19–32)
Calcium: 9.6 mg/dL (ref 8.4–10.5)
Chloride: 99 mEq/L (ref 96–112)
Creatinine, Ser: 0.82 mg/dL (ref 0.4–1.2)
GFR calc Af Amer: >60 mL/min (ref >60)
GFR calc non Af Amer: >60 mL/min (ref >60)
Glucose, Bld: 109 mg/dL — ABNORMAL HIGH (ref 70–99)
Potassium: 4.5 mEq/L (ref 3.5–5.1)
Sodium: 136 mEq/L (ref 135–145)
Total Bilirubin: 0.9 mg/dL (ref 0.3–1.2)
Total Protein: 6.9 g/dL (ref 6.0–8.3)

## 2011-04-03 ENCOUNTER — Other Ambulatory Visit: Payer: Self-pay | Admitting: Oncology

## 2011-04-03 ENCOUNTER — Encounter (HOSPITAL_BASED_OUTPATIENT_CLINIC_OR_DEPARTMENT_OTHER): Payer: Medicare Other | Admitting: Oncology

## 2011-04-03 DIAGNOSIS — D500 Iron deficiency anemia secondary to blood loss (chronic): Secondary | ICD-10-CM

## 2011-04-03 DIAGNOSIS — K3189 Other diseases of stomach and duodenum: Secondary | ICD-10-CM

## 2011-04-03 DIAGNOSIS — K5900 Constipation, unspecified: Secondary | ICD-10-CM

## 2011-04-03 DIAGNOSIS — D509 Iron deficiency anemia, unspecified: Secondary | ICD-10-CM

## 2011-04-03 LAB — CBC WITH DIFFERENTIAL/PLATELET
BASO%: 0.3 % (ref 0.0–2.0)
Basophils Absolute: 0 10*3/uL (ref 0.0–0.1)
EOS%: 1.3 % (ref 0.0–7.0)
Eosinophils Absolute: 0.1 10*3/uL (ref 0.0–0.5)
HCT: 34.1 % — ABNORMAL LOW (ref 34.8–46.6)
HGB: 12.1 g/dL (ref 11.6–15.9)
LYMPH%: 14.8 % (ref 14.0–49.7)
MCH: 32 pg (ref 25.1–34.0)
MCHC: 35.5 g/dL (ref 31.5–36.0)
MCV: 90.3 fL (ref 79.5–101.0)
MONO#: 0.5 10*3/uL (ref 0.1–0.9)
MONO%: 7.2 % (ref 0.0–14.0)
NEUT#: 4.9 10*3/uL (ref 1.5–6.5)
NEUT%: 76.4 % (ref 38.4–76.8)
Platelets: 208 10*3/uL (ref 145–400)
RBC: 3.78 10*6/uL (ref 3.70–5.45)
RDW: 15.3 % — ABNORMAL HIGH (ref 11.2–14.5)
WBC: 6.4 10*3/uL (ref 3.9–10.3)
lymph#: 1 10*3/uL (ref 0.9–3.3)

## 2011-04-03 LAB — COMPREHENSIVE METABOLIC PANEL
ALT: 17 U/L (ref 0–35)
AST: 20 U/L (ref 0–37)
Albumin: 4.5 g/dL (ref 3.5–5.2)
Alkaline Phosphatase: 61 U/L (ref 39–117)
BUN: 20 mg/dL (ref 6–23)
CO2: 29 mEq/L (ref 19–32)
Calcium: 10 mg/dL (ref 8.4–10.5)
Chloride: 101 mEq/L (ref 96–112)
Creatinine, Ser: 0.69 mg/dL (ref 0.40–1.20)
Glucose, Bld: 94 mg/dL (ref 70–99)
Potassium: 4.4 mEq/L (ref 3.5–5.3)
Sodium: 138 mEq/L (ref 135–145)
Total Bilirubin: 0.9 mg/dL (ref 0.3–1.2)
Total Protein: 6.3 g/dL (ref 6.0–8.3)

## 2011-04-03 LAB — IRON AND TIBC
%SAT: 24 % (ref 20–55)
Iron: 78 ug/dL (ref 42–145)
TIBC: 327 ug/dL (ref 250–470)
UIBC: 249 ug/dL

## 2011-04-03 LAB — HAPTOGLOBIN: Haptoglobin: 13 mg/dL — ABNORMAL LOW (ref 16–200)

## 2011-04-03 LAB — FERRITIN: Ferritin: 91 ng/mL (ref 10–291)

## 2011-05-08 ENCOUNTER — Other Ambulatory Visit: Payer: Self-pay | Admitting: Family Medicine

## 2011-05-08 DIAGNOSIS — Z1231 Encounter for screening mammogram for malignant neoplasm of breast: Secondary | ICD-10-CM

## 2011-05-08 NOTE — Op Note (Signed)
NAMESTEWART, PIMENTA           ACCOUNT NO.:  [id number]   MEDICAL RECORD NO.:  [id number]          PATIENT TYPE:  AMB   LOCATION:  DAY                          FACILITY[facility]  [facility]   PHYSICIAN:  Currie Paris, M.D.DATE OF BIRTH:  12-27-1927   DATE OF PROCEDURE:  02/17/2008  DATE OF DISCHARGE:                               OPERATIVE REPORT   PREOPERATIVE DIAGNOSIS:  Recurrent right lower quadrant incisional  hernia.   POSTOPERATIVE DIAGNOSIS:  Recurrent right lower quadrant incisional  hernia.   OPERATION:  Repair with mesh (Parietex).   SURGEON:  Currie Paris, M.D.   ASSISTANT:  Anselm Pancoast. Zachery Dakins, M.D.   ANESTHESIA:  General.   CLINICAL HISTORY:  Ms.  [name] is a 77 year old lady who has previously  had low anterior resection of her colon which was accompanied with a  protected ileostomy.  The ileostomy was closed but a hernia developed.  The hernia was repaired apparently with an onlay mesh.  This original  surgery was all done in [place].  Several months ago I took her back to  the operating room to do a repeat colectomy and at that time she had  what looked like a little bit of a defect at the top end of her prior  right lower quadrant repair which I attempted to repair with some  Prolene sutures, but did not want to put mesh in because we had open  bowel.  She subsequently developed a symptomatic right lower quadrant  hernia that was about 4 cm in diameter.  It did seem to reduce.  It  seemed to be above the old repair.   DESCRIPTION OF PROCEDURE:  The patient was seen in the holding area and  she had no further questions.  I identified and marked the area in the  right lower quadrant of the hernia.  She was taken to the operating  room, after satisfactory general anesthesia had been obtained, the  abdomen was prepped and draped.  The time out was performed.   I made a transverse incision directly over the palpable defect.  I got  through the  subcutaneous tissues and appeared to see what looked like  sac.  I started to free this up from the subcu and recognized that there  was some mesh at the very lower end of the defect.  Things were fairly  stuck to the mesh, so I chose to start working superiorly and identified  fascia and began using some blunt and sharp dissection to peel the  fascia off of the subcu circumferentially so I could identify the edges.  I found not only was there a single main defect as noted above but also  a couple of very small secondary defects right where there were some  Prolene sutures and it looked like they had pulled through.  I managed  to find the edge of the fascia superiorly and then worked my way around,  first a little bit medially and then a little bit laterally, and then I  freed up some of the material that was protruding out off of the  inferior aspect.  I finally got into the peritoneal cavity somewhat  laterally and was able to free up some adhesions off of the inside of  the abdominal wall circumferentially going down to the last area which  was stuck inferiorly.  There was a single loop of small bowel stuck  densely to the under surface of the mesh from the prior repair.  There  was Prolene sutures there, as well, that it was stuck to. Once I got all  that freed off, and this took several minutes dissection, I was able to  have a nice free plane which appeared to have omentum and fatty tissue  and some peritoneum on top of it, but I had good exposure of the  peritoneal surface of the abdomen to put some mesh in.   I took a 12 cm diameter circular piece of Parietex mesh and laid it into  the wound so that it laid out perfectly flat and it covered the edge of  the defect by about 4 cm in all directions.  I then used multiple  interrupted sutures of 0 Prolene going well lateral to the edge of the  fascial defect, through the fascia, through the mesh, and back through  the fascia.  I put  all these in circumferentially and tied them all down  anchoring the mesh nicely.  A few extra sutures were placed to make sure  we did not have any gaps between the first set of sutures.  The mesh  appeared to lay nicely.   The defect, itself, was then closed over the mesh using interrupted 0  Prolenes.  It actually closed more transversely which seemed to have  much less tension and I thought would also cause less distortion of the  underlying mesh.  Once this was done, I irrigated and made sure  everything was dry.  I placed a 19 Blake drain in the subcu.  I put  about 20 mL of 0.5% plain Marcaine in for postop analgesia.  We  irrigated, again, and then closed the subcu with 3-0 Vicryl and the skin  with 3-0 Monocryl subcuticular and Dermabond.  The drain was secured  with a Prolene suture.  The patient tolerated the procedure well.  Estimated blood loss was less than 20 mL.  There were no operative  complications.  All counts were correct.      Currie Paris, M.D.  Electronically Signed     CJS/MEDQ  D:  02/17/2008  T:  02/17/2008  Job:  [id number]

## 2011-05-08 NOTE — Op Note (Signed)
Carol [name], Carol [name] NO.:  [id number]   MEDICAL RECORD NO.:  [id number]          PATIENT TYPE:  INP   LOCATION:  X007                         FACILITY:  [facility]   PHYSICIAN:  Heloise Purpura, MD      DATE OF BIRTH:  13-Jun-1928   DATE OF PROCEDURE:  06/25/2007  DATE OF DISCHARGE:                               OPERATIVE REPORT   PREOPERATIVE DIAGNOSIS:  Rectal mass.   POSTOPERATIVE DIAGNOSIS:  Rectal mass.   OPERATION PERFORMED:  1. Cystoscopy.  2. Bilateral retrograde pyelography.  3. Bilateral ureteral stent placement.   SURGEON:  Heloise Purpura, MD   ANESTHESIA:  General.   COMPLICATIONS:  None.   INDICATIONS FOR PROCEDURE:  [name] is a 77 year old female who was  found to have a rectal mass and was scheduled by Dr. Jamey Ripa to undergo a  low anterior resection.  Due to the fact that she had had prior surgery  and for purposes of being able to identify the ureters intraoperatively,  it was requested to have ureteral stents placed prior to her procedure.  Potential risks and benefits were discussed with the patient and  informed consent was obtained.   DESCRIPTION OF PROCEDURE:  The patient was taken to the operating room  and a general anesthetic was administered.  She was given preoperative  antibiotics, placed in the dorsal lithotomy position, and prepped and  draped in the usual sterile fashion.  Next a preoperative time out was  performed.  Cystourethroscopy was then performed which demonstrated no  evidence of any bladder tumors, stones or other mucosal pathology.  The  ureteral orifices were noted to be in normal anatomic position  bilaterally.  The right ureteral orifice was identified and a 0.038  Sensor guidewire was used to intubate the right ureteral orifice.  A 6  French ureteral catheter was advanced over the wire into the distal  ureter.  Contrast was injected which did not demonstrate any obvious  filling defects or dilation of the  ureter or renal collecting systems.  The wire was then advanced up into the renal pelvis under fluoroscopic  guidance and the 6 French ureteral catheter was advanced up into the  renal pelvis.  The wire was then removed and attention turned to the  left ureteral orifice where a second 6 French ureteral catheter was  placed into the left renal pelvis in an identical fashion.  Again on  that side, retrograde pyelography was performed and there was no  evidence of any filling defects, ureteral dilation, or dilation of the  renal collecting system.  The cystoscope was then removed and the  ureteral catheters were tied with a silk suture to a 16 French Foley  catheter which was placed  into the bladder.  The stents and catheter were then placed to drainage.  Fluoroscopy was used to re-examine the placement of the proximal end of  the ureteral stents and they did appear to be in good position.  The  patient tolerated the procedure well.  At this point the patient was  prepared for her low anterior resection by Dr. Jamey Ripa.  ______________________________  Heloise Purpura, MD  Electronically Signed     LB/MEDQ  D:  06/25/2007  T:  06/26/2007  Job:  [id number]   cc:   Currie Paris, M.D.  [address], [address]  [place]  [place] [address]

## 2011-05-08 NOTE — H&P (Signed)
[name], [name] NO.:  [id number]   MEDICAL RECORD NO.:  [id number]          PATIENT TYPE:  INP   LOCATION:  1535                         [facility]ACILITY:  [facility]   PHYSICIAN:  Ardeth Sportsman, MD     DATE OF BIRTH:  May 31, 1928   DATE OF ADMISSION:  07/10/2007  DATE OF DISCHARGE:                              HISTORY & PHYSICAL   PRIMARY CARE PHYSICIAN:  Jethro Bastos, M.D.   GASTROENTEROLOGIST:  Everardo All. Madilyn Fireman, M.D.   SURGEON:  Currie Paris, M.D. -- Ardeth Sportsman, MD (covering)   REASON FOR ADMISSION:  Nausea, vomiting, partial small-bowel obstruction  versus possible gastritis.   HISTORY OF PRESENT ILLNESS:  Carol [name] is a 77 year old female who has  had prior abdominal surgeries in the past including incisional hernias,  and partial colectomy in the past for, I believe, a colonic polyp.  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  She recovered from that  surgery relatively well; and actually has been advancing her diet.  She  has been having flatus, regular bowel movements, with no bad bouts of  constipation or diarrhea.  She has not had any sick contact.  She has  not traveled out of the country.  For breakfast she had an egg, which  her husband had as well.  Later in the morning, she started developing  some nausea.  She had some yogurt for lunch; and then started developing  some vomiting.  She has not been able to keep anything down; and she  started having some abdominal discomfort.  Briefly, I talked to her over  the phone; and because she had deteriorated, she came to the emergency  room for evaluation.  She notes that she is having very little flatus,  no diarrhea.  Apparently a grandchild came over yesterday, but otherwise  no children have been in contact with her.  No one else has any flu  bugs  or is sick.  She does not have a history of irritable bowel  syndrome, ulcerative colitis, or any other bowel problems that she can  recall.  She notes that she has some mild abdominal discomfort in her  upper abdomen, but it is not severe.  Apparently she was 10/10 pain when  she came in, but she denies that right now; and has had one dose of  morphine and feels comfortable.   PAST MEDICAL HISTORY:  1. Tubovillous adenoma of the rectum; status post low-anterior      resection on June 25, 2007 by Dr. Currie Paris.  2. Prior colon polyp, status post prior resections.  3. Abdominal hernia, status post repair.  4. Hypothyroidism.  5. Hypertension.   PAST SURGICAL HISTORY:  1. Again, a low-anterior resection and lysis of adhesions after      ureteral stenting on June 25, 2007 by Dr. Jamey Ripa.  2. Prior colectomy with anastomosis.  3. Incisional hernia status post repair.  ALLERGIES:  None.   MEDICATIONS:  She takes Synthroid, aspirin, Fosamax, Hyzaar and  Simvastatin.   SOCIAL HISTORY:  She is here today with her husband at the bedside along  with her family.  No tobacco, alcohol, or drug abuse.   FAMILY HISTORY:  Noncontributory for any inflammatory bowel disease or  any other major GI problems.   REVIEW OF SYSTEMS:  Notable for HPI. GENERAL:  She has not really had  any fevers or chills or weight change.  EYES, ENT, CARDIAC, RESPIRATORY:  Otherwise unremarkable.  GI: As noted above.  No real hematemesis.  No  hematochezia melena.  She had a recent colonoscopy by Dr. Madilyn Fireman that was  otherwise unremarkable.  GYN, NEUROLOGIC, MUSCULOSKELETAL, PSYCHIATRIC,  HEME/LYMPH ALLERGIC AND OTHER REVIEW OF SYSTEMS:  Negative.   PHYSICAL EXAMINATION:  VITAL SIGNS:  T-max was 97.1.  Pulse initially  was around 121, but decreased to around 107.  Respirations 20, blood  pressure 103/69.  Saturating 95% on room air.  GENERAL:  She is a well-developed well-nourished overweight female not   toxic and not in any concerning distress.  PSYCH:  She is pleasant and interactive with no evidence of any  dementia, delirium, psychosis or paranoia. She has at least average to  above-average intelligence with good insight.  NEUROLOGICAL:  Cranial nerves II-XII are intact.  Hand grip is 5/5 equal  and symmetrical.  No resting or retention tremors.  HEENT:  Eyes -- pupils equal round and react to light.  Extraocular  movements are intact.  Sclerae not icteric or injected.  Normocephalic.  Mucous membranes are dry.  The nasopharynx and oropharynx are clear.  NECK:  Supple without any masses.  Trachea is midline.  HEART:  Regular rate and rhythm but tachycardic.  She does have a 3/6  pansystolic ejection murmur that radiates to her left carotid, but  otherwise no carotid bruits.  CHEST:  Clear to auscultation bilaterally, no wheezes rales, or rhonchi.  No pain derived by sternal compression.  ABDOMEN:  Very distended, but soft; and she has no pain to my  examination.  Her incision as well healed.  No obvious incisional  hernia; and she has a prior ileostomy site.  PELVIC:  Genitalia normal female, with no inguinal hernias.  RECTAL:  Deferred per patient request.  EXTREMITIES:  No clubbing cyanosis or edema.  MUSCULOSKELETAL:  Full range of motion of shoulders, elbows, wrists,  hips, knees, and ankles.  LYMPH:  no headache, axillary, groin lymphadenopathy.  SKIN:  No obvious petechiae nor other sores or lesions.   LABORATORY VALUES:  Her white count is elevated at 14.4 with a slight  left shift.  Her electrolytes are mostly in the normal range with a  potassium of 4.2 and creatinine of 1.1.  Her AST and ALT are slightly  above normal at 40 and 48, respectively.  Urinalysis does show a few  ketones along with 0-2 white blood cells, and small amount of  lymphocytes, but negative for nitrates and trace of ketones.  X-ray of  the abdomen show some air fluid levels concerning for ileus  versus  partial small-bowel obstruction.   ASSESSMENT:  1. This 77 year old female on postop day #16 from lysis of adhesions      and low-anterior resection for a large tubovillous adenoma with      high-grade dysplasia; now with sudden acute onset of nausea and      vomiting with concerns of a gastroenteritis versus partial small  bowel obstruction.  Plan -- admit.  2. IV fluids.  3. Serial abdominal exams.  4. Keep NPO for now; if she has emesis we will place an NG tube.  5. If she has worsening leukocytosis or worsening abdominal pain or      intractable nausea,  she may require reexploration.  This is not a      good time to go in there, at two weeks, as the inflammation will be      high and risk      of enterotomy and subcutaneous fistula, and other risks will be      increased as well.  6. Tachycardia.  We will get an EKG to confirm sinus tachycardia and      any other concerning lesions.  7. Hold on her home medications for now, and follow expectantly.      Ardeth Sportsman, MD  Electronically Signed     SCG/MEDQ  D:  07/11/2007  T:  07/11/2007  Job:  [id number]

## 2011-05-08 NOTE — Op Note (Signed)
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   POSTOPERATIVE DIAGNOSES:  1. 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  2. Extensive intra-abdominal adhesions.  3. Right lower quadrant incisional hernia.   OPERATION:  Exploratory laparotomy with extensive adhesiolysis (1 hour),  low anterior resection with takedown of the splenic flexure, closure of  recurrent right lower quadrant incisional hernia.   SURGEON:  Currie Paris, M.D.   ASSISTANT:  Jaclynn Guarneri, MD   ANESTHESIA:  General endotracheal.   CLINICAL HISTORY:  Ms. Aughenbaugh is a 79-year lady who was recently found  to have a polypoid lesion in the rectum that was too big to be removed  with a scope.  It is at about 14 cm and does not appear to be amenable  for transanal excision either.  It was therefore elected to proceed to a  resection.  Of note is that the patient had previously had somewhat low  anterior resection with a Baker anastomosis done many years ago in  [place] apparently for another polypoid benign lesion.  She subsequently  had a ventral hernia repair.  At the time of her surgery, she had had an  ileostomy with takedown of the ileostomy and apparently repair of that  hernia at that site as well.  After discussion with the patient, she was  ready to proceed to surgery.  Because of the prior pelvic surgery, we  elected to have ureteral catheters placed prior to doing our surgery.   DESCRIPTION OF PROCEDURE:  The patient seen in the holding area and she  had no further  questions about her surgery.  She was taken to the  operating room and after satisfactory general endotracheal anesthesia  had been obtained, bilateral ureteral catheters were placed; this was  done by Dr. Laverle Patter and the dictation is separate.   Upon completion of that, the patient was continued in the yellow fin  stirrups.  The entire abdomen and perineal area were prepped and draped  as a sterile field.  The time-out occurred.   I made a midline incision from just below the umbilicus to the  symphysis.  We got through the fascia and noticed that there was some  mesh placed anterior to the fascia that we went through from her prior  ventral hernia repair.  There was really no good plane of admission into  the peritoneal cavity and we found multiple adhesions of the small bowel  down into the pelvis and to the anterior abdominal wall.  There was a  right incisional hernia where her prior ileostomy had been placed.  It  was not apparent preoperatively and we could see that there was  mesh in  this as well.   It took me over an hour to free up the small bowel out of the pelvis,  out of loops that were stuck together to the pelvis, stuck to the  uterus, stuck to the tubes and ovary and to the descending colon.  I  also had to free the omentum up well superiorly so that we could get the  small bowel off the anterior abdominal wall so that we could get some  self-retaining retractors in and get some mobility to pack the bowel out  of the pelvis.   Once that was all done, I was then able to put a self-retaining  retractor in.  I mobilized the descending colon and I was able to  palpate both ureteral catheters so that we knew where they were for the  entire case and they were well away from our operative sites.   Initially, I could palpate no mass in the rectum or colon that I had  visualized, nor could I see any dye.  It was my belief from Dr. Madilyn Fireman'  note that the lesion was below the  prior anastomosis and I was therefore  going to need to take the anastomosis out, so I went ahead and freed up  the colon right above the prior anastomosis and divided the sigmoid at  this level.  Using this as a handle then, I was able to gradually free  up the rectum; it was somewhat curled upon itself.  As I got this freed  up somewhat, I still did not definitely see any ink staining from the  tattooing that had been done this morning, so I stopped and placed a  sigmoidoscope in and was able to measure the area of [place] ink at about  12 cm.  While I was doing this, Dr. Johna Sheriff, my assistant, was able to  put a suture in to mark the colon at that point and this was really  going to be below the peritoneal reflection or right at it.   I then came back above and using new gloves and gown, we proceeded to  divide the lateral attachments of the rectum and the posterior  attachments; this all very tedious and difficult because of the prior  surgery and the plane between the rectum and the sacrum had been  obliterated and this was all scarred down until I got to about the level  of about 7 cm from the anal verge.  Again, I was able to identify the  ureters by palpation and stay well away from them, staying close to the  colon, since this was supposed to be a benign lesion.   I eventually got to where I thought I was well below the suture that we  placed by about 4 or 5 cm.  I then had the rectum completely freed up in  all directions and an area that I thought I could divide the colon  safely and have enough room for an anastomosis and have good blood  supply.  At this point, Dr. Johna Sheriff went below again to confirm that  the lesion was above where I was planning to divide the colon and we  could see it at this point, right at the area of the suture, so I went  approximately 3 cm beyond that.  We put the stapler across it and  divided the bowel.  I then opened it up off the table and the  lesion was  seen in the specimen near the distal end of the resection.  Unfortunately, the way I cut the specimen open, it appeared that the  margin was fairly close, but I think the stapled end was further down  from where the adenoma was.  This appeared to be a villous adenoma.   We went back above and continued to irrigate and make sure everything  was dry in the pelvis.  I then freed up the descending colon, going up  to the splenic flexure as far as I could and freeing up the lateral  attachments of the rectum so that I had good mobility and could do an  anastomosis that would be tension-free.  This colon all appeared to have  excellent blood supply.  Once I had this freed down so I had redundant  proximal colon in the pelvis, I went ahead at this point to divide the  stapled end off so that we had an area for the stapler.  I put the  pursestring device across about 1.5 cm from where I had stapled the  colon off, placed the pursestring with the Prolenes and then cut the  excess off.  We had a nice lumen, which appeared to be healthy and with  good blood supply.  I put the 29 anvil in and tied the pursestring down  and again, this looked good and healthy.   At this point, Dr. Johna Sheriff again went below.  We had already irrigated  and made sure everything in the pelvis appeared dry.  He put the stapler  in the rectum and I could both by palpation and visualization see the  end of it right at the anastomosis.  The staple trocar came out just  anterior to the middle of the anastomosis.  The anvil was attached and  the stapler closed.  Again, we seemed to have no tension and everything  looked fine, so the stapler was fired.   The visualization was difficult because the anastomosis was really at  about 6 cm; however, it appeared intact.  Dr. Johna Sheriff scoped the  patient and we inflated with air, flooded the pelvis with saline and  there was no evidence of any air leaks and again the  anastomosis looked  fine.   We checked the 2 rings on the stapler; they appeared to be completely  intact, so at this point, I was satisfied that we had a good  anastomosis.   We went back with new gloves and instruments and irrigated.  Everything  again was appeared loose with good blood supply and no tension.  At this  point I decided to close the abdomen.  Where we had freed up the right  lower quadrant, we had identified what appeared to be a hole in the  fascia and some mesh above that or more superficial to that and we had  had omentum stuck up in that, so I went ahead and freed that up a little  bit more to make sure I had good visualization and closed this with  interrupted 0 Prolenes, simply so that there would be no exposed mesh  for the bowel to get up against.  The omentum was still stuck to a lot  of the small bowel and I just laid that down so that we had that over  the closure and then close the midline fascia with a running #1 Novofil;  this closed nicely and with no tension and we appeared to have healthy  tissue.  The wound was irrigated and the skin closed with staples.   The patient tolerated the procedure well.  There were no operative  complications and all counts were correct.      Currie Paris, M.D.  Electronically Signed     CJS/MEDQ  D:  06/25/2007  T:  06/26/2007  Job:  [id number]   cc:   Jethro Bastos, M.D.  Fax: [phone]   Everardo All. Madilyn Fireman, M.D.  Fax: [phone]

## 2011-05-11 NOTE — Discharge Summary (Signed)
[name], Carol [name] NO.:  [id number]   MEDICAL RECORD NO.:  [id number]          PATIENT TYPE:  INP   LOCATION:  1527                         FACILITY:  [facility]   PHYSICIAN:  Currie Paris, M.D.DATE OF BIRTH:  06/06/1928   DATE OF ADMISSION:  06/25/2007  DATE OF DISCHARGE:  07/02/2007                               DISCHARGE SUMMARY   FINAL DIAGNOSIS:  1. 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  2. Extensive intra-abdominal adhesions.  3. Recurrent right lower quadrant ventral incisional hernia.   MEDICAL HISTORY:  [name] is a 77 year old lady recently found to  have a large rectal polyp thought amenable to a low anterior resection.  She had had a previous low anterior resection followed by hernia repair  many years ago in another city.   HOSPITAL COURSE:  The patient was admitted and taken to the operating  room.  She had a very low anastomosis performed with take down of the  splenic flexure.  She had an extensive adhesive lysis.  We also repaired  her ventral hernia.  She had a very benign postoperative course.  She  had a low grade fever for a day or so.  This cleared up rapidly.  By the  fifth day, she was having small bowel movement and taking liquids, and  we were able to progress her diet such that on July 19, she felt well  and able to go home.  Pathology report was noted and discussed with the  patient.  She was discharged on July 9 in satisfactory condition to  resume her usual home medications.   LABORATORY STUDIES:  Preop hemoglobin 14 with a white count of 7700.  It  drifted to 10.9 postoperatively.  Electrolytes were basically within  normal limits.  She had slight elevation of AST and ALT of 50 and 55,  respectively.      Currie Paris, M.D.  Electronically Signed     CJS/MEDQ  D:  08/04/2007  T:  08/05/2007  Job:  [id number]   cc:   Jethro Bastos, M.D.  Fax:  [phone]   Everardo All. Madilyn Fireman, M.D.  Fax: [phone]

## 2011-05-11 NOTE — Discharge Summary (Signed)
[name], [name] NO.:  [id number]   MEDICAL RECORD NO.:  [id number]         PATIENT TYPE:  LINP   LOCATION:                               [facility]ACILITY:  [facility]   PHYSICIAN:  Ardeth Sportsman, MD     DATE OF BIRTH:  29-Sep-1928   DATE OF ADMISSION:  07/11/2007  DATE OF DISCHARGE:  07/14/2007                               DISCHARGE SUMMARY   PRIMARY CARE PHYSICIAN:  Dr. Oletta Cohn.   SURGEON:  Dr. Cyndia Bent.   DIAGNOSES:  1. Pelvic abscess, status post long anterior resection.  2. 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 status      post low anterior resection and lysis of adhesions.  3. Hypothyroidism.  4. Hypertension.  5. Hypercholesterolemia.  6. Osteoarthritis.   PROCEDURE:  1. CT scan of the abdomen and pelvis noted 4.5 x 3.1 x 2.7 abscess      adjacent to the rectosigmoid anastomosis on 07/11/2007.  2. CT-guided aspiration of pelvic abscess by Interventional Radiology,      supervised by Dr. Irish Lack.   HOSPITAL COURSE:  [name] is a 77 year old female who had a  tubulovillous adenoma of the rectum that developed high grade dysplasia.  She underwent low anterior resection and recovered relatively well and  was discharged.  However, she started developing worsening abdominal  pain and discomfort.  She started developing some nausea and vomiting.  Based on these concerns, she came to the emergency room.   She was admitted and hydrated and placed on IV antibiotics.  CT scan  revealed pelvic fluid collection.  She underwent percutaneous drainage.  There was no evidence of any active anastomotic leak.  She defervesced.  Her bowel function improved.  By the time of discharge on hospital day  #3, she was tolerating a solid diet & feeling much better.  Leukocytosis  had resolved.  She was walking well in the hallways with no nausea or  vomiting with improved bowel function.  Per Dr. Tenna Child request, drain  was  removed.   The patient has improved and will be discharged with the following  instructions:  1. She should take Augmentin 875 mg p.o. b.i.d.  She had been on      Invanz IV in hospital.  2.  She should resume her home medications      which include Synthroid, aspirin, Fosamax, Hyzaar and simvastatin.  2. She should return to clinic to see Dr. Jamey Ripa in the next couple      weeks for followup.  3. She should call if she has any fevers, chills, sweats, nausea,      vomiting, worsening abdominal pain or any other concerns.      Ardeth Sportsman, MD  Electronically Signed     SCG/MEDQ  D:  07/29/2007  T:  07/29/2007  Job:  [id number]

## 2011-06-15 ENCOUNTER — Ambulatory Visit: Payer: Medicare Other

## 2011-06-22 ENCOUNTER — Ambulatory Visit
Admission: RE | Admit: 2011-06-22 | Discharge: 2011-06-22 | Disposition: A | Discharge status: Home / Self Care | Payer: Medicare Other | Source: Ambulatory Visit | Attending: Family Medicine | Admitting: Family Medicine

## 2011-06-22 DIAGNOSIS — Z1231 Encounter for screening mammogram for malignant neoplasm of breast: Secondary | ICD-10-CM

## 2011-06-26 ENCOUNTER — Other Ambulatory Visit: Payer: Self-pay | Admitting: Family Medicine

## 2011-06-26 DIAGNOSIS — R928 Other abnormal and inconclusive findings on diagnostic imaging of breast: Secondary | ICD-10-CM

## 2011-07-04 ENCOUNTER — Ambulatory Visit
Admission: RE | Admit: 2011-07-04 | Discharge: 2011-07-04 | Disposition: A | Discharge status: Home / Self Care | Payer: Medicare Other | Source: Ambulatory Visit | Attending: Family Medicine | Admitting: Family Medicine

## 2011-07-04 DIAGNOSIS — R928 Other abnormal and inconclusive findings on diagnostic imaging of breast: Secondary | ICD-10-CM

## 2011-07-05 ENCOUNTER — Other Ambulatory Visit: Payer: Self-pay | Admitting: Family Medicine

## 2011-07-05 DIAGNOSIS — N6489 Other specified disorders of breast: Secondary | ICD-10-CM

## 2011-07-05 DIAGNOSIS — R928 Other abnormal and inconclusive findings on diagnostic imaging of breast: Secondary | ICD-10-CM

## 2011-07-11 ENCOUNTER — Ambulatory Visit
Admission: RE | Admit: 2011-07-11 | Discharge: 2011-07-11 | Disposition: A | Discharge status: Home / Self Care | Payer: Medicare Other | Source: Ambulatory Visit | Attending: Family Medicine | Admitting: Family Medicine

## 2011-07-11 DIAGNOSIS — N6489 Other specified disorders of breast: Secondary | ICD-10-CM

## 2011-07-11 DIAGNOSIS — R928 Other abnormal and inconclusive findings on diagnostic imaging of breast: Secondary | ICD-10-CM

## 2011-07-11 MED ORDER — GADOBENATE DIMEGLUMINE 529 MG/ML IV SOLN
14.0000 mL | Freq: Once | INTRAVENOUS | Status: AC | PRN
  Administered 2011-07-11: 14 mL via INTRAVENOUS

## 2011-07-13 ENCOUNTER — Ambulatory Visit
Admission: RE | Admit: 2011-07-13 | Discharge: 2011-07-13 | Disposition: A | Discharge status: Home / Self Care | Payer: Medicare Other | Source: Ambulatory Visit | Attending: Family Medicine | Admitting: Family Medicine

## 2011-07-13 ENCOUNTER — Other Ambulatory Visit: Payer: Self-pay | Admitting: Family Medicine

## 2011-07-13 DIAGNOSIS — J948 Other specified pleural conditions: Secondary | ICD-10-CM

## 2011-07-13 MED ORDER — IOHEXOL 300 MG/ML  SOLN: 75.0000 mL | Freq: Once | INTRAMUSCULAR | Status: DC | PRN

## 2011-07-13 MED ORDER — IOHEXOL 300 MG/ML  SOLN
75.0000 mL | Freq: Once | INTRAMUSCULAR | Status: AC | PRN
  Administered 2011-07-13: 75 mL via INTRAVENOUS

## 2011-09-12 ENCOUNTER — Other Ambulatory Visit: Payer: Self-pay | Admitting: Oncology

## 2011-09-12 ENCOUNTER — Encounter (HOSPITAL_BASED_OUTPATIENT_CLINIC_OR_DEPARTMENT_OTHER): Payer: Medicare Other | Admitting: Oncology

## 2011-09-12 DIAGNOSIS — D509 Iron deficiency anemia, unspecified: Secondary | ICD-10-CM

## 2011-09-12 DIAGNOSIS — D500 Iron deficiency anemia secondary to blood loss (chronic): Secondary | ICD-10-CM

## 2011-09-12 LAB — CBC WITH DIFFERENTIAL/PLATELET
BASO%: 0.5 % (ref 0.0–2.0)
Basophils Absolute: 0 10*3/uL (ref 0.0–0.1)
EOS%: 1.2 % (ref 0.0–7.0)
Eosinophils Absolute: 0.1 10*3/uL (ref 0.0–0.5)
HCT: 40 % (ref 34.8–46.6)
HGB: 13.1 g/dL (ref 11.6–15.9)
LYMPH%: 18.5 % (ref 14.0–49.7)
MCH: 27 pg (ref 25.1–34.0)
MCHC: 32.8 g/dL (ref 31.5–36.0)
MCV: 82.5 fL (ref 79.5–101.0)
MONO#: 0.8 10*3/uL (ref 0.1–0.9)
MONO%: 9.3 % (ref 0.0–14.0)
NEUT#: 6.2 10*3/uL (ref 1.5–6.5)
NEUT%: 70.5 % (ref 38.4–76.8)
Platelets: 171 10*3/uL (ref 145–400)
RBC: 4.85 10*6/uL (ref 3.70–5.45)
RDW: 16.4 % — ABNORMAL HIGH (ref 11.2–14.5)
WBC: 8.8 10*3/uL (ref 3.9–10.3)
lymph#: 1.6 10*3/uL (ref 0.9–3.3)

## 2011-09-12 LAB — IRON AND TIBC
%SAT: 14 % — ABNORMAL LOW (ref 20–55)
Iron: 55 ug/dL (ref 42–145)
TIBC: 399 ug/dL (ref 250–470)
UIBC: 344 ug/dL (ref 125–400)

## 2011-09-12 LAB — FERRITIN: Ferritin: 24 ng/mL (ref 10–291)

## 2011-09-14 LAB — BASIC METABOLIC PANEL
BUN: 15
CO2: 32
Calcium: 9.3
Chloride: 99
Creatinine, Ser: 0.73
GFR calc Af Amer: >60
GFR calc non Af Amer: >60
Glucose, Bld: 117 — ABNORMAL HIGH
Potassium: 4
Sodium: 137

## 2011-09-14 LAB — HEMOGLOBIN AND HEMATOCRIT, BLOOD
HCT: 39.1
Hemoglobin: 13.7

## 2011-09-26 ENCOUNTER — Ambulatory Visit
Admission: RE | Admit: 2011-09-26 | Discharge: 2011-09-26 | Disposition: A | Discharge status: Home / Self Care | Payer: Medicare Other | Source: Ambulatory Visit | Attending: Cardiology | Admitting: Cardiology

## 2011-09-26 ENCOUNTER — Other Ambulatory Visit: Payer: Self-pay | Admitting: Cardiology

## 2011-09-26 DIAGNOSIS — R0609 Other forms of dyspnea: Secondary | ICD-10-CM

## 2011-09-26 DIAGNOSIS — R0989 Other specified symptoms and signs involving the circulatory and respiratory systems: Secondary | ICD-10-CM

## 2011-10-08 LAB — COMPREHENSIVE METABOLIC PANEL
ALT: 48 — ABNORMAL HIGH
AST: 40 — ABNORMAL HIGH
Albumin: 4.2
Alkaline Phosphatase: 62
BUN: 28 — ABNORMAL HIGH
CO2: 25
Calcium: 9.3
Chloride: 97
Creatinine, Ser: 1.13
GFR calc Af Amer: 56 — ABNORMAL LOW
GFR calc non Af Amer: 47 — ABNORMAL LOW
Glucose, Bld: 139 — ABNORMAL HIGH
Potassium: 4.2
Sodium: 130 — ABNORMAL LOW
Total Bilirubin: 2 — ABNORMAL HIGH
Total Protein: 6.5

## 2011-10-08 LAB — URINALYSIS, ROUTINE W REFLEX MICROSCOPIC
Glucose, UA: NEGATIVE
Hgb urine dipstick: NEGATIVE
Nitrite: NEGATIVE
Protein, ur: NEGATIVE
Specific Gravity, Urine: 1.019
Urobilinogen, UA: 0.2
pH: 6

## 2011-10-08 LAB — CBC
HCT: 27.4 — ABNORMAL LOW
HCT: 36.6
Hemoglobin: 12.6
Hemoglobin: 9.5 — ABNORMAL LOW
MCHC: 34.5
MCHC: 34.8
MCV: 84.3
MCV: 85.2
Platelets: 243
Platelets: 330
RBC: 3.22 — ABNORMAL LOW
RBC: 4.35
RDW: 14.9 — ABNORMAL HIGH
RDW: 14.9 — ABNORMAL HIGH
WBC: 14.4 — ABNORMAL HIGH
WBC: 4.5

## 2011-10-08 LAB — BASIC METABOLIC PANEL
BUN: 10
CO2: 28
Calcium: 7.9 — ABNORMAL LOW
Chloride: 109
Creatinine, Ser: 0.87
GFR calc Af Amer: >60
GFR calc non Af Amer: >60
Glucose, Bld: 104 — ABNORMAL HIGH
Potassium: 4.1
Sodium: 141

## 2011-10-08 LAB — URINE MICROSCOPIC-ADD ON

## 2011-10-08 LAB — DIFFERENTIAL
Basophils Absolute: 0
Basophils Relative: 0
Eosinophils Absolute: 0
Eosinophils Relative: 0
Lymphocytes Relative: 3 — ABNORMAL LOW
Lymphs Abs: 0.5 — ABNORMAL LOW
Monocytes Absolute: 0.7
Monocytes Relative: 5
Neutro Abs: 13.2 — ABNORMAL HIGH
Neutrophils Relative %: 92 — ABNORMAL HIGH

## 2011-10-08 LAB — GRAM STAIN: Gram Stain: NONE SEEN

## 2011-10-08 LAB — BODY FLUID CULTURE: Culture: NO GROWTH

## 2011-10-09 LAB — BASIC METABOLIC PANEL
BUN: 2 — ABNORMAL LOW
BUN: 5 — ABNORMAL LOW
CO2: 26
CO2: 28
Calcium: 7.9 — ABNORMAL LOW
Calcium: 8.1 — ABNORMAL LOW
Chloride: 102
Chloride: 104
Creatinine, Ser: 0.65
Creatinine, Ser: 0.79
GFR calc Af Amer: >60
GFR calc Af Amer: >60
GFR calc non Af Amer: >60
GFR calc non Af Amer: >60
Glucose, Bld: 142 — ABNORMAL HIGH
Glucose, Bld: 181 — ABNORMAL HIGH
Potassium: 3.5
Potassium: 4.6
Sodium: 134 — ABNORMAL LOW
Sodium: 136

## 2011-10-09 LAB — CBC
HCT: 30.5 — ABNORMAL LOW
HCT: 33 — ABNORMAL LOW
Hemoglobin: 10.9 — ABNORMAL LOW
Hemoglobin: 11.5 — ABNORMAL LOW
MCHC: 34.9
MCHC: 35.7
MCV: 85.6
MCV: 86.6
Platelets: 175
Platelets: 175
RBC: 3.57 — ABNORMAL LOW
RBC: 3.81 — ABNORMAL LOW
RDW: 14
RDW: 14.3 — ABNORMAL HIGH
WBC: 7.3
WBC: 8.9

## 2011-10-10 LAB — URINALYSIS, ROUTINE W REFLEX MICROSCOPIC
Bilirubin Urine: NEGATIVE
Glucose, UA: NEGATIVE
Hgb urine dipstick: NEGATIVE
Ketones, ur: NEGATIVE
Nitrite: NEGATIVE
Protein, ur: NEGATIVE
Specific Gravity, Urine: 1.012
Urobilinogen, UA: 0.2
pH: 7

## 2011-10-10 LAB — COMPREHENSIVE METABOLIC PANEL
ALT: 55 — ABNORMAL HIGH
AST: 50 — ABNORMAL HIGH
Albumin: 4.2
Alkaline Phosphatase: 51
BUN: 16
CO2: 33 — ABNORMAL HIGH
Calcium: 10.2
Chloride: 101
Creatinine, Ser: 0.8
GFR calc Af Amer: >60
GFR calc non Af Amer: >60
Glucose, Bld: 101 — ABNORMAL HIGH
Potassium: 5.4 — ABNORMAL HIGH
Sodium: 141
Total Bilirubin: 0.9
Total Protein: 6.4

## 2011-10-10 LAB — DIFFERENTIAL
Basophils Absolute: 0
Basophils Relative: 0
Eosinophils Absolute: 0.2
Eosinophils Relative: 2
Lymphocytes Relative: 22
Lymphs Abs: 1.7
Monocytes Absolute: 0.8 — ABNORMAL HIGH
Monocytes Relative: 11
Neutro Abs: 5
Neutrophils Relative %: 65

## 2011-10-10 LAB — ABO/RH: ABO/RH(D): O POS

## 2011-10-10 LAB — CBC
HCT: 40.9
Hemoglobin: 14.3
MCHC: 34.9
MCV: 86.3
Platelets: 183
RBC: 4.74
RDW: 14.4 — ABNORMAL HIGH
WBC: 7.7

## 2011-10-10 LAB — TYPE AND SCREEN
ABO/RH(D): O POS
Antibody Screen: NEGATIVE
DAT, IgG: NEGATIVE

## 2011-10-17 ENCOUNTER — Ambulatory Visit
Admission: RE | Admit: 2011-10-17 | Discharge: 2011-10-17 | Disposition: A | Discharge status: Home / Self Care | Payer: Medicare Other | Source: Ambulatory Visit | Attending: Cardiology | Admitting: Cardiology

## 2011-10-17 ENCOUNTER — Other Ambulatory Visit: Payer: Self-pay | Admitting: Cardiology

## 2011-10-17 DIAGNOSIS — R932 Abnormal findings on diagnostic imaging of liver and biliary tract: Secondary | ICD-10-CM

## 2011-11-01 ENCOUNTER — Institutional Professional Consult (permissible substitution): Payer: Medicare Other | Admitting: Internal Medicine

## 2011-11-06 ENCOUNTER — Encounter: Payer: Self-pay | Admitting: Internal Medicine

## 2011-11-07 ENCOUNTER — Encounter: Payer: Self-pay | Admitting: Internal Medicine

## 2011-11-07 ENCOUNTER — Ambulatory Visit (INDEPENDENT_AMBULATORY_CARE_PROVIDER_SITE_OTHER): Payer: Medicare Other | Admitting: Internal Medicine

## 2011-11-07 VITALS — BP 164/82 | HR 50 | Temp 97.6°F | Ht 63.0 in | Wt 154.8 lb

## 2011-11-07 DIAGNOSIS — J984 Other disorders of lung: Secondary | ICD-10-CM

## 2011-11-07 DIAGNOSIS — Z87898 Personal history of other specified conditions: Secondary | ICD-10-CM | POA: Insufficient documentation

## 2011-11-07 DIAGNOSIS — I350 Nonrheumatic aortic (valve) stenosis: Secondary | ICD-10-CM

## 2011-11-07 DIAGNOSIS — I359 Nonrheumatic aortic valve disorder, unspecified: Secondary | ICD-10-CM

## 2011-11-07 DIAGNOSIS — R911 Solitary pulmonary nodule: Secondary | ICD-10-CM

## 2011-11-07 HISTORY — DX: Personal history of other specified conditions: Z87.898

## 2011-11-07 NOTE — Patient Instructions (Signed)
Please see patient coordinator before you leave today  to schedule PET scan and I will call when results are available

## 2011-11-07 NOTE — Progress Notes (Signed)
  Subjective:    Patient ID: Carol [name], female    DOB: [birth year], 77 y.o.   MRN: [id number]  HPI  74 yowf quit smoking in 1980 s resp problems then and developed doe in 2012 eval by Donnie Aho with AS and with cxr new LUL nodule  > CT Chest > referral to pulmonary clinic 11/08/2011   11/07/2011  Carol [name]/ 1st pulmonary eval cc mildly progreessive indolent onset x 6 months  sob with fast walk or inclines, does ok flat and slow and sleeping without noct or am disturbance or cough. No pleuritic  Cp hemoptysis or h/o RA or primary tumor though did undergo low ap colon resection for tubulovillous adenoma with high grade dysplasia  No variability to doe.  . Also denies any obvious fluctuation of symptoms with weather or environmental changes or other aggravating or alleviating factors except as outlined above      Review of Systems  Constitutional: Negative for fever, chills and unexpected weight change.  HENT: Negative for ear pain, nosebleeds, congestion, sore throat, rhinorrhea, sneezing, trouble swallowing, dental problem, voice change, postnasal drip and sinus pressure.   Eyes: Negative for visual disturbance.  Respiratory: Positive for shortness of breath. Negative for cough and choking.   Cardiovascular: Negative for chest pain and leg swelling.  Gastrointestinal: Negative for vomiting, abdominal pain and diarrhea.  Genitourinary: Negative for difficulty urinating.  Musculoskeletal: Negative for arthralgias.  Skin: Negative for rash.  Neurological: Negative for tremors, syncope and headaches.  Hematological: Does not bruise/bleed easily.       Objective:   Physical Exam  Pleasant amb wf nad  Wt 154 11/07/11   HEENT: nl dentition, turbinates, and orophanx. Nl external ear canals without cough reflex   NECK :  without JVD/Nodes/TM/ nl carotid upstrokes bilaterally   LUNGS: no acc muscle use, clear to A and P bilaterally without cough on insp or exp maneuvers   CV:  RRR  no  s3   III/VI SEM, no  increase in P2, no edema   ABD:  soft and nontender with nl excursion in the supine position. No bruits or organomegaly, bowel sounds nl  MS:  warm without deformities, calf tenderness, cyanosis or clubbing  SKIN: warm and dry without lesions    NEURO:  alert, approp, no deficits        Assessment & Plan:

## 2011-11-08 DIAGNOSIS — I350 Nonrheumatic aortic (valve) stenosis: Secondary | ICD-10-CM | POA: Insufficient documentation

## 2011-11-08 NOTE — Assessment & Plan Note (Signed)
Agree with radiology that appears benign but was not present on apples to apples comparison cxr in  02/14/10  Discussed in detail all the  indications, usual  risks and alternatives  relative to the benefits with patient who agrees to proceed with pet scan then consideration of excisional bx if positive even though this well may well prove to be benign (bx in this setting is usually not of benefit unless she's considered inoperable as it certainly appears resectable)  The issue is whether her AS is severe enough to warrant AVR prior to or as part of lung surgery, will certainly need to clarify this preop with Dr York Spaniel input.  Her lung function seems quite good and suspect the doe is related to as or deconditioning, not copd, but of course will need preop pft's as well.

## 2011-11-08 NOTE — Assessment & Plan Note (Signed)
-   ECHO  Mean gradient 31 with AVA .7cm  09/26/11 and conc LVH with mild reduction EF to 45-50%  Reviewed echo data, will discuss with Dr Donnie Aho p PET available to determine whether or not surgery needed

## 2011-11-12 ENCOUNTER — Encounter: Payer: Self-pay | Admitting: Cardiology

## 2011-11-19 ENCOUNTER — Ambulatory Visit (HOSPITAL_COMMUNITY)
Admission: RE | Admit: 2011-11-19 | Discharge: 2011-11-19 | Disposition: A | Discharge status: Home / Self Care | Payer: Medicare Other | Source: Ambulatory Visit | Attending: Internal Medicine | Admitting: Internal Medicine

## 2011-11-19 DIAGNOSIS — C3490 Malignant neoplasm of unspecified part of unspecified bronchus or lung: Secondary | ICD-10-CM | POA: Insufficient documentation

## 2011-11-19 DIAGNOSIS — J984 Other disorders of lung: Secondary | ICD-10-CM | POA: Insufficient documentation

## 2011-11-19 DIAGNOSIS — R911 Solitary pulmonary nodule: Secondary | ICD-10-CM

## 2011-11-19 LAB — GLUCOSE, CAPILLARY: Glucose-Capillary: 116 mg/dL — ABNORMAL HIGH (ref 70–99)

## 2011-11-20 ENCOUNTER — Telehealth: Payer: Self-pay | Admitting: Internal Medicine

## 2011-11-20 ENCOUNTER — Encounter (HOSPITAL_COMMUNITY)
Admission: RE | Admit: 2011-11-20 | Discharge: 2011-11-20 | Disposition: A | Discharge status: Home / Self Care | Payer: Medicare Other | Source: Ambulatory Visit | Attending: Internal Medicine | Admitting: Internal Medicine

## 2011-11-20 ENCOUNTER — Encounter: Payer: Self-pay | Admitting: Internal Medicine

## 2011-11-20 ENCOUNTER — Encounter (HOSPITAL_COMMUNITY): Payer: Self-pay

## 2011-11-20 DIAGNOSIS — I517 Cardiomegaly: Secondary | ICD-10-CM | POA: Insufficient documentation

## 2011-11-20 DIAGNOSIS — J984 Other disorders of lung: Secondary | ICD-10-CM | POA: Insufficient documentation

## 2011-11-20 DIAGNOSIS — Q619 Cystic kidney disease, unspecified: Secondary | ICD-10-CM | POA: Insufficient documentation

## 2011-11-20 DIAGNOSIS — J438 Other emphysema: Secondary | ICD-10-CM | POA: Insufficient documentation

## 2011-11-20 DIAGNOSIS — Z9089 Acquired absence of other organs: Secondary | ICD-10-CM | POA: Insufficient documentation

## 2011-11-20 HISTORY — DX: Other nonspecific abnormal finding of lung field: R91.8

## 2011-11-20 LAB — GLUCOSE, CAPILLARY: Glucose-Capillary: 116 mg/dL — ABNORMAL HIGH (ref 70–99)

## 2011-11-20 MED ORDER — FLUDEOXYGLUCOSE F - 18 (FDG) INJECTION
16.5000 | Freq: Once | INTRAVENOUS | Status: AC | PRN
  Administered 2011-11-20: 16.5 via INTRAVENOUS

## 2011-11-20 NOTE — Telephone Encounter (Signed)
Daughter returning call

## 2011-11-20 NOTE — Telephone Encounter (Signed)
[id number] with Vikki Ports. Carron Curie, CMA

## 2011-11-20 NOTE — Progress Notes (Signed)
Quick Note:  Spoke with pt and notified of results per Dr. Sherene Sires. Pt verbalized understanding, but has some questions and wants MW to call her. Will forward msg back to MW.   ______

## 2011-11-20 NOTE — Telephone Encounter (Signed)
I spoke with pt daughter and she wanted to make sure that someone spoke with her mother regarding her results. I advised her that MW's nurse did speak with pt. She verbalized understanding and needed nothing further

## 2011-11-23 NOTE — Progress Notes (Signed)
Quick Note:  Spoke with pt and sched her for rov with cxr for 01/17/11 at 11 am and advised to arrive 10 min prior to have cxr done. ______

## 2011-12-10 ENCOUNTER — Other Ambulatory Visit: Payer: Self-pay | Admitting: Family Medicine

## 2011-12-10 DIAGNOSIS — N63 Unspecified lump in unspecified breast: Secondary | ICD-10-CM

## 2011-12-10 DIAGNOSIS — N6489 Other specified disorders of breast: Secondary | ICD-10-CM

## 2011-12-12 ENCOUNTER — Ambulatory Visit: Payer: Medicare Other | Admitting: Oncology

## 2011-12-12 ENCOUNTER — Other Ambulatory Visit: Payer: Medicare Other | Admitting: Lab

## 2011-12-20 ENCOUNTER — Ambulatory Visit
Admission: RE | Admit: 2011-12-20 | Discharge: 2011-12-20 | Disposition: A | Discharge status: Home / Self Care | Payer: Medicare Other | Source: Ambulatory Visit | Attending: Cardiology | Admitting: Cardiology

## 2011-12-20 ENCOUNTER — Other Ambulatory Visit: Payer: Self-pay | Admitting: Cardiology

## 2011-12-20 ENCOUNTER — Encounter: Payer: Self-pay | Admitting: Cardiology

## 2011-12-20 ENCOUNTER — Encounter (HOSPITAL_COMMUNITY): Payer: Self-pay | Admitting: Pharmacy Technician

## 2011-12-20 DIAGNOSIS — R0989 Other specified symptoms and signs involving the circulatory and respiratory systems: Secondary | ICD-10-CM

## 2011-12-20 DIAGNOSIS — M5136 Other intervertebral disc degeneration, lumbar region: Secondary | ICD-10-CM

## 2011-12-20 DIAGNOSIS — I119 Hypertensive heart disease without heart failure: Secondary | ICD-10-CM | POA: Insufficient documentation

## 2011-12-20 DIAGNOSIS — R0600 Dyspnea, unspecified: Secondary | ICD-10-CM | POA: Insufficient documentation

## 2011-12-20 DIAGNOSIS — E785 Hyperlipidemia, unspecified: Secondary | ICD-10-CM | POA: Insufficient documentation

## 2011-12-20 DIAGNOSIS — E039 Hypothyroidism, unspecified: Secondary | ICD-10-CM | POA: Insufficient documentation

## 2011-12-20 DIAGNOSIS — R0609 Other forms of dyspnea: Secondary | ICD-10-CM

## 2011-12-20 NOTE — H&P (Signed)
Carol [name]    Date of visit:  12/20/2011 DOB:  [birth year]    Age:  77 yrs. Medical record number:  [id number]     Account number:  [id number] Primary Care Provider: Shaune Pollack ____________________________ CURRENT DIAGNOSES  1. Aortic Valve-Stenosis  2. Dyspnea  3. Hypertension,Essential (Benign)  4. Hyperlipidemia  5. Anemia,other specified  6. Hypothyroidism  7. Abnormal Chest x-ray ____________________________ ALLERGIES  NKDA ____________________________ MEDICATIONS  1. Synthroid 75 mcg Tablet, 1 p.o. daily  2. Calcium + D 600 mg (1,500)-200 unit Tab, BID  3. Multivitamins Tab, 1 p.o. daily  4. Vitamin C 500 mg Tablet, 1 p.o. daily  5. Colace 100 mg Capsule, 1 p.o. daily  6. Ocuvite Tablet, 1 p.o. daily  7. Iron (ferrous sulfate) 325 mg (65 mg iron) Tablet, 1 p.o. daily  8. Vitamin D3 4,000 unit capsule, 1 p.o. daily  9. gabapentin 300 mg capsule, TID  10. lutein 20 mg capsule, 1 p.o. daily  11. atorvastatin 10 mg tablet, 1 p.o. daily  12. Probiotic 10 billion cell capsule, 1 p.o. daily ____________________________ HISTORY OF PRESENT ILLNESS  Patient seen early for evaluation of dyspnea. She has a history of hypertension and hyperlipidemia. She has a history of aortic valve disease and had dyspnea in October and was found to have severe aortic stenosis. Since it was an isolated episode she wanted to wait and see if she had further issues before proceeding with potential aortic valve replacement. A Christmas Eve she had an onset of wheezing that happened that evening that lasted about 30 minutes and resolved. She then noted some dyspnea with exertion over Christmas day and gradually got better as the day went on. She did not have symptoms at rest. She called the office late yesterday afternoon and is seen today. She denies PND, orthopnea, syncope, or claudication. She is not currently having any anginal pain.  ____________________________ PAST HISTORY  Past Medical  Illnesses:  hypertension, hyperlipidemia, hypothyroidism, spinal stenosis, lumbar disc disease, obesity, anemia-iron deficieny 2012;  Cardiovascular Illnesses:  aortic stenosis;  Surgical Procedures:  colectomy-partial, hernia repair, laminectomy lumbar March 2011;  Cardiology Procedures-Invasive:  no previous interventional or invasive cardiology procedures;  Cardiology Procedures-Noninvasive:  echocardiogram April 2012, echocardiogram October 2012;  LVEF of 60% documented via echocardiogram on 09/26/2011 ____________________________ CARDIO-PULMONARY TEST DATES EKG Date:  12/20/2011;  Echocardiography Date: 09/26/2011;  Chest Xray Date: 09/26/2011;  CT Scan Date:  10/17/2011   ____________________________ FAMILY HISTORY Father - age 12,  died of myocardial infarction; Mother - age 67, died cancer unknown type; Brother 1 - age 41, died of CHF; Sister 1 - age 23,  alive and well; Sister 2 - age 56, died cancer unknown type;  ____________________________ SOCIAL HISTORY Alcohol Use:  wine 1-2 per day;  Smoking:  used to smoke but quit 1980;  Diet:  regular diet;  Lifestyle:  widowed and remarried;  Exercise:  aerobics 3 days per week;  Occupation:  retired Music therapist;  Residence:  lives with husband;   ____________________________ REVIEW OF SYSTEMS General:  no change in weight  Integumentary:  no rashes or new skin lesions.Eyes:  cataract extraction O.S., cataract extraction O.S.  Ears, Nose, Throat, Mouth:  denies any hearing loss, epistaxis, hoarseness or difficulty speaking.  Respiratory:  mild dyspnea with exertion  Cardiovascular:  please review HPI  Abdominal:  denies dyspepsia, GI bleeding, constipation, or diarrhea  Genitourinary-Female:  mild stress incontinence  Musculoskeletal:  chronic back pain  Neurological:  Peripheral neuropathy ____________________________ PHYSICAL  EXAMINATION VITAL SIGNS  Blood Pressure:  140/80 Sitting, Left arm, regular cuff   Pulse:  80/min. Weight:  149.00 lbs.  Height:  61"BMI: 28  Constitutional:  pleasant white female, in no acute distress, mildly obese Skin:  warm and dry to touch, no apparent skin lesions, or masses noted. Head:  normocephalic, normal hair pattern, no masses or tenderness ENT:  ears, nose and throat reveal no gross abnormalities.  Dentition good. Neck:  supple, without massess. No JVD, thyromegaly or carotid bruits. Carotid upstroke normal. Chest:  normal symmetry, clear to auscultation and percussion. Cardiac:  irregular rhythm, normal S1 and S2, no S3 or S4, grade 2/6 systolic murmur at aortic area radiating to neck Abdomen:  abdomen soft,non-tender, no masses, no hepatospenomegaly, or aneurysm noted Peripheral Pulses:  dorsalis pedis pulses diminished, posterior tibial pulses diminished Extremities & Back:  no edema present, bilateral venous insufficiency changes present Neurological:  no gross motor or sensory deficits noted, affect appropriate, oriented x3. ____________________________ MOST RECENT LIPID PANEL 01/18/09  CHOL TOTL 151 mg/dl, LDL 74 Calc , HDL 63 mg/dl, TRIGLYCER 82 mg/dl, ALT 53 u/l, CHOL/HDL 0.45 (Calc) and AST 49 u/l ____________________________ IMPRESSIONS/PLAN  1. Worsening dyspnea with exertion 2. Severe stenosis 3. Hypertension 4. Hyperlipidemia under treatment 5. Hypothyroidism  Recommendations:  The patient has worsening dyspnea and her EKG is unremarkable. I have recommended that she have cardiac catheterization to determine if she has any concomitant coronary artery disease.The cardiac catheterization procedure was explained to the patient including risks of myocardial infarction, death, stroke, bleeding, arrhythmia, dye allergy, renal insufficiency. She understands and is willing to proceed. In addition we talked about potential aortic valve replacement and options available there. We also talked about options of medical treatment not having as good an outcome his aortic valve  replacement. ____________________________ TODAYS ORDERS  1. 12 Lead EKG: Today  2. Chest X-ray PA/Lat: today  3. Comprehensive Metabolic Panel: Today  4. Complete Blood Count: Today  5. Draw PT/INR: Today  6. PTT: Today  7. Right and Left Heart Cath: 1 week                       ____________________________ Cardiology Physician:  Darden Palmer MD [facility]

## 2011-12-25 HISTORY — PX: CARDIAC CATHETERIZATION: SHX172

## 2011-12-26 ENCOUNTER — Encounter (HOSPITAL_COMMUNITY): Payer: Self-pay | Admitting: Cardiology

## 2011-12-26 ENCOUNTER — Inpatient Hospital Stay (HOSPITAL_COMMUNITY)
Admission: RE | Admit: 2011-12-26 | Discharge: 2012-01-07 | DRG: 217 | Disposition: A | Discharge status: Home Health | Payer: Medicare Other | Source: Ambulatory Visit | Attending: Cardiothoracic Surgery | Admitting: Cardiothoracic Surgery

## 2011-12-26 ENCOUNTER — Other Ambulatory Visit: Payer: Self-pay

## 2011-12-26 ENCOUNTER — Encounter (HOSPITAL_COMMUNITY)
Admission: RE | Disposition: A | Discharge status: Home Health | Payer: Self-pay | Source: Ambulatory Visit | Attending: Cardiothoracic Surgery

## 2011-12-26 DIAGNOSIS — R911 Solitary pulmonary nodule: Secondary | ICD-10-CM | POA: Diagnosis present

## 2011-12-26 DIAGNOSIS — M129 Arthropathy, unspecified: Secondary | ICD-10-CM | POA: Diagnosis present

## 2011-12-26 DIAGNOSIS — E669 Obesity, unspecified: Secondary | ICD-10-CM | POA: Diagnosis present

## 2011-12-26 DIAGNOSIS — I359 Nonrheumatic aortic valve disorder, unspecified: Secondary | ICD-10-CM | POA: Diagnosis not present

## 2011-12-26 DIAGNOSIS — D375 Neoplasm of uncertain behavior of rectum: Secondary | ICD-10-CM | POA: Insufficient documentation

## 2011-12-26 DIAGNOSIS — I10 Essential (primary) hypertension: Secondary | ICD-10-CM | POA: Diagnosis not present

## 2011-12-26 DIAGNOSIS — D381 Neoplasm of uncertain behavior of trachea, bronchus and lung: Secondary | ICD-10-CM | POA: Diagnosis not present

## 2011-12-26 DIAGNOSIS — Z954 Presence of other heart-valve replacement: Secondary | ICD-10-CM | POA: Diagnosis not present

## 2011-12-26 DIAGNOSIS — I4892 Unspecified atrial flutter: Secondary | ICD-10-CM | POA: Diagnosis not present

## 2011-12-26 DIAGNOSIS — I119 Hypertensive heart disease without heart failure: Secondary | ICD-10-CM | POA: Diagnosis present

## 2011-12-26 DIAGNOSIS — D62 Acute posthemorrhagic anemia: Secondary | ICD-10-CM | POA: Diagnosis not present

## 2011-12-26 DIAGNOSIS — R918 Other nonspecific abnormal finding of lung field: Secondary | ICD-10-CM | POA: Diagnosis not present

## 2011-12-26 DIAGNOSIS — R079 Chest pain, unspecified: Secondary | ICD-10-CM | POA: Diagnosis not present

## 2011-12-26 DIAGNOSIS — D509 Iron deficiency anemia, unspecified: Secondary | ICD-10-CM | POA: Diagnosis present

## 2011-12-26 DIAGNOSIS — I2510 Atherosclerotic heart disease of native coronary artery without angina pectoris: Secondary | ICD-10-CM | POA: Diagnosis present

## 2011-12-26 DIAGNOSIS — E871 Hypo-osmolality and hyponatremia: Secondary | ICD-10-CM | POA: Diagnosis not present

## 2011-12-26 DIAGNOSIS — Z01810 Encounter for preprocedural cardiovascular examination: Secondary | ICD-10-CM | POA: Diagnosis not present

## 2011-12-26 DIAGNOSIS — E785 Hyperlipidemia, unspecified: Secondary | ICD-10-CM | POA: Diagnosis present

## 2011-12-26 DIAGNOSIS — I4891 Unspecified atrial fibrillation: Secondary | ICD-10-CM | POA: Diagnosis not present

## 2011-12-26 DIAGNOSIS — Z952 Presence of prosthetic heart valve: Secondary | ICD-10-CM

## 2011-12-26 DIAGNOSIS — J8410 Pulmonary fibrosis, unspecified: Secondary | ICD-10-CM | POA: Diagnosis not present

## 2011-12-26 DIAGNOSIS — Z7982 Long term (current) use of aspirin: Secondary | ICD-10-CM

## 2011-12-26 DIAGNOSIS — J90 Pleural effusion, not elsewhere classified: Secondary | ICD-10-CM | POA: Diagnosis not present

## 2011-12-26 DIAGNOSIS — D696 Thrombocytopenia, unspecified: Secondary | ICD-10-CM | POA: Diagnosis not present

## 2011-12-26 DIAGNOSIS — R197 Diarrhea, unspecified: Secondary | ICD-10-CM | POA: Diagnosis not present

## 2011-12-26 DIAGNOSIS — R222 Localized swelling, mass and lump, trunk: Secondary | ICD-10-CM | POA: Diagnosis not present

## 2011-12-26 DIAGNOSIS — Z09 Encounter for follow-up examination after completed treatment for conditions other than malignant neoplasm: Secondary | ICD-10-CM | POA: Diagnosis not present

## 2011-12-26 DIAGNOSIS — I350 Nonrheumatic aortic (valve) stenosis: Secondary | ICD-10-CM

## 2011-12-26 DIAGNOSIS — Z87898 Personal history of other specified conditions: Secondary | ICD-10-CM | POA: Insufficient documentation

## 2011-12-26 DIAGNOSIS — E039 Hypothyroidism, unspecified: Secondary | ICD-10-CM | POA: Diagnosis present

## 2011-12-26 DIAGNOSIS — R0600 Dyspnea, unspecified: Secondary | ICD-10-CM | POA: Insufficient documentation

## 2011-12-26 DIAGNOSIS — Z951 Presence of aortocoronary bypass graft: Secondary | ICD-10-CM | POA: Diagnosis not present

## 2011-12-26 DIAGNOSIS — J9819 Other pulmonary collapse: Secondary | ICD-10-CM | POA: Diagnosis not present

## 2011-12-26 DIAGNOSIS — D72829 Elevated white blood cell count, unspecified: Secondary | ICD-10-CM | POA: Diagnosis not present

## 2011-12-26 HISTORY — DX: Atherosclerotic heart disease of native coronary artery without angina pectoris: I25.10

## 2011-12-26 HISTORY — DX: Neoplasm of uncertain behavior of rectum: D37.5

## 2011-12-26 HISTORY — PX: LEFT AND RIGHT HEART CATHETERIZATION WITH CORONARY ANGIOGRAM: SHX5449

## 2011-12-26 LAB — POCT I-STAT 3, ART BLOOD GAS (G3+)
Bicarbonate: 25.3 mEq/L — ABNORMAL HIGH (ref 20.0–24.0)
O2 Saturation: 95 %
TCO2: 27 mmol/L (ref 0–100)
pCO2 arterial: 40.8 mmHg (ref 35.0–45.0)
pH, Arterial: 7.401 — ABNORMAL HIGH (ref 7.350–7.400)
pO2, Arterial: 78 mmHg — ABNORMAL LOW (ref 80.0–100.0)

## 2011-12-26 LAB — POCT ACTIVATED CLOTTING TIME: Activated Clotting Time: 166 seconds

## 2011-12-26 LAB — BASIC METABOLIC PANEL
BUN: 17 mg/dL (ref 6–23)
CO2: 25 mEq/L (ref 19–32)
Calcium: 9.3 mg/dL (ref 8.4–10.5)
Chloride: 103 mEq/L (ref 96–112)
Creatinine, Ser: 0.66 mg/dL (ref 0.50–1.10)
GFR calc Af Amer: >90 mL/min (ref >90)
GFR calc non Af Amer: 81 mL/min — ABNORMAL LOW (ref >90)
Glucose, Bld: 106 mg/dL — ABNORMAL HIGH (ref 70–99)
Potassium: 3.9 mEq/L (ref 3.5–5.1)
Sodium: 136 mEq/L (ref 135–145)

## 2011-12-26 LAB — CBC
HCT: 40.2 % (ref 36.0–46.0)
Hemoglobin: 13.4 g/dL (ref 12.0–15.0)
MCH: 29.3 pg (ref 26.0–34.0)
MCHC: 33.3 g/dL (ref 30.0–36.0)
MCV: 87.8 fL (ref 78.0–100.0)
Platelets: 145 10*3/uL — ABNORMAL LOW (ref 150–400)
RBC: 4.58 MIL/uL (ref 3.87–5.11)
RDW: 14.6 % (ref 11.5–15.5)
WBC: 5.3 10*3/uL (ref 4.0–10.5)

## 2011-12-26 LAB — POCT I-STAT 3, VENOUS BLOOD GAS (G3P V)
Acid-base deficit: 3 mmol/L — ABNORMAL HIGH (ref 0.0–2.0)
Bicarbonate: 23.1 mEq/L (ref 20.0–24.0)
O2 Saturation: 62 %
TCO2: 24 mmol/L (ref 0–100)
pCO2, Ven: 45.7 mmHg (ref 45.0–50.0)
pH, Ven: 7.312 — ABNORMAL HIGH (ref 7.250–7.300)
pO2, Ven: 35 mmHg (ref 30.0–45.0)

## 2011-12-26 SURGERY — LEFT AND RIGHT HEART CATHETERIZATION WITH CORONARY ANGIOGRAM
Anesthesia: LOCAL

## 2011-12-26 MED ORDER — SODIUM CHLORIDE 0.9 % IV SOLN
INTRAVENOUS | Status: DC
  Administered 2011-12-26: 10:00:00 via INTRAVENOUS

## 2011-12-26 MED ORDER — DIAZEPAM 5 MG PO TABS
5.0000 mg | ORAL_TABLET | ORAL | Status: AC
  Administered 2011-12-26: 5 mg via ORAL
  Filled 2011-12-26: qty 1

## 2011-12-26 MED ORDER — NITROGLYCERIN 0.2 MG/ML ON CALL CATH LAB
INTRAVENOUS | Status: AC
  Filled 2011-12-26: qty 1

## 2011-12-26 MED ORDER — ACETAMINOPHEN 325 MG PO TABS
650.0000 mg | ORAL_TABLET | ORAL | Status: DC | PRN
Start: 2011-12-26 — End: 2011-12-26

## 2011-12-26 MED ORDER — DOCUSATE SODIUM 100 MG PO CAPS
100.0000 mg | ORAL_CAPSULE | Freq: Every day | ORAL | Status: DC
  Administered 2011-12-26: 100 mg via ORAL
  Filled 2011-12-26 (×4): qty 1

## 2011-12-26 MED ORDER — VITAMIN C 500 MG PO TABS
500.0000 mg | ORAL_TABLET | Freq: Every day | ORAL | Status: DC
  Administered 2011-12-27 – 2011-12-31 (×4): 500 mg via ORAL
  Filled 2011-12-26 (×6): qty 1

## 2011-12-26 MED ORDER — ROSUVASTATIN CALCIUM 20 MG PO TABS
20.0000 mg | ORAL_TABLET | Freq: Every day | ORAL | Status: DC
  Administered 2011-12-26 – 2011-12-31 (×6): 20 mg via ORAL
  Filled 2011-12-26 (×8): qty 1

## 2011-12-26 MED ORDER — MIDAZOLAM HCL 2 MG/2ML IJ SOLN
INTRAMUSCULAR | Status: AC
  Filled 2011-12-26: qty 2

## 2011-12-26 MED ORDER — ONDANSETRON HCL 4 MG/2ML IJ SOLN: 4.0000 mg | Freq: Four times a day (QID) | INTRAMUSCULAR | Status: DC | PRN

## 2011-12-26 MED ORDER — ADULT MULTIVITAMIN W/MINERALS CH
1.0000 | ORAL_TABLET | Freq: Every day | ORAL | Status: DC
  Administered 2011-12-27 – 2011-12-31 (×4): 1 via ORAL
  Filled 2011-12-26 (×6): qty 1

## 2011-12-26 MED ORDER — ENOXAPARIN SODIUM 40 MG/0.4ML ~~LOC~~ SOLN: 40.0000 mg | SUBCUTANEOUS | Status: DC

## 2011-12-26 MED ORDER — HEPARIN SODIUM (PORCINE) 1000 UNIT/ML IJ SOLN
INTRAMUSCULAR | Status: AC
  Filled 2011-12-26: qty 1

## 2011-12-26 MED ORDER — LIDOCAINE HCL (PF) 1 % IJ SOLN
INTRAMUSCULAR | Status: AC
  Filled 2011-12-26: qty 30

## 2011-12-26 MED ORDER — LOSARTAN POTASSIUM 50 MG PO TABS
50.0000 mg | ORAL_TABLET | Freq: Every day | ORAL | Status: DC
  Filled 2011-12-26: qty 1

## 2011-12-26 MED ORDER — GABAPENTIN 300 MG PO CAPS
300.0000 mg | ORAL_CAPSULE | Freq: Three times a day (TID) | ORAL | Status: DC
  Administered 2011-12-26 – 2011-12-31 (×14): 300 mg via ORAL
  Filled 2011-12-26 (×21): qty 1

## 2011-12-26 MED ORDER — ACETAMINOPHEN 325 MG PO TABS: 650.0000 mg | ORAL_TABLET | ORAL | Status: DC | PRN

## 2011-12-26 MED ORDER — CHOLECALCIFEROL 10 MCG (400 UNIT) PO TABS
400.0000 [IU] | ORAL_TABLET | Freq: Every day | ORAL | Status: DC
  Administered 2011-12-27 – 2011-12-31 (×4): 400 [IU] via ORAL
  Filled 2011-12-26 (×6): qty 1

## 2011-12-26 MED ORDER — DIPHENHYDRAMINE HCL 25 MG PO CAPS
25.0000 mg | ORAL_CAPSULE | Freq: Every evening | ORAL | Status: DC | PRN
  Administered 2011-12-26: 25 mg via ORAL
  Filled 2011-12-26: qty 1

## 2011-12-26 MED ORDER — ENOXAPARIN SODIUM 40 MG/0.4ML ~~LOC~~ SOLN
40.0000 mg | SUBCUTANEOUS | Status: DC
  Filled 2011-12-26: qty 0.4

## 2011-12-26 MED ORDER — HEPARIN (PORCINE) IN NACL 2-0.9 UNIT/ML-% IJ SOLN
INTRAMUSCULAR | Status: AC
  Filled 2011-12-26: qty 2000

## 2011-12-26 MED ORDER — LUTEIN 20 MG PO CAPS: 20.0000 mg | ORAL_CAPSULE | Freq: Every day | ORAL | Status: DC

## 2011-12-26 MED ORDER — ASPIRIN EC 81 MG PO TBEC
81.0000 mg | DELAYED_RELEASE_TABLET | Freq: Every day | ORAL | Status: DC
  Administered 2011-12-27: 81 mg via ORAL
  Filled 2011-12-26 (×2): qty 1

## 2011-12-26 MED ORDER — SODIUM CHLORIDE 0.9 % IV SOLN: 1.0000 mL/kg/h | INTRAVENOUS | Status: AC

## 2011-12-26 MED ORDER — LEVOTHYROXINE SODIUM 75 MCG PO TABS
75.0000 ug | ORAL_TABLET | Freq: Every day | ORAL | Status: DC
  Administered 2011-12-27 – 2011-12-31 (×4): 75 ug via ORAL
  Filled 2011-12-26 (×6): qty 1

## 2011-12-26 MED ORDER — HEPARIN SOD (PORCINE) IN D5W 100 UNIT/ML IV SOLN
1050.0000 [IU]/h | INTRAVENOUS | Status: DC
  Administered 2011-12-26: 800 [IU]/h via INTRAVENOUS
  Administered 2011-12-28: 1050 [IU]/h via INTRAVENOUS
  Filled 2011-12-26 (×6): qty 250

## 2011-12-26 NOTE — Brief Op Note (Signed)
12/26/2011  12:57 PM  PATIENT:  Carol [name]  78 y.o. female  PRE-OPERATIVE DIAGNOSIS:  Aortic stenosis and shortness of breath POST-OPERATIVE DIAGNOSIS:  Severe aortic stenosis, severe ostial left main disease  Cardiac catheterization done without complications. Full report to follow. She had a 40 mm peak to peak gradient across the aortic valve and severe ostial left main disease with ventricularization of the waveform.  Darden Palmer MD [facility]

## 2011-12-26 NOTE — Progress Notes (Signed)
Stable post cath.  With severe AS and ostial LM disease will keep in house for  Cardiac surgery eval.  Begin heparin later on.

## 2011-12-26 NOTE — Progress Notes (Signed)
ANTICOAGULATION CONSULT NOTE - Initial Consult  Pharmacy Consult for Heparin Indication: CAD - Awaiting possible CABG  Allergies  Allergen Reactions  . Ibuprofen     Hx of GI bleed  . Tape Rash    Reaction:Blisters (can use paper tape)    Patient Measurements: Height: 5\' 4"  (162.6 cm) Weight: 145 lb (65.772 kg) IBW/kg (Calculated) : 54.7  Adjusted Body Weight: NA  Vital Signs: Temp: 97.2 F (36.2 C) (01/02 1449) Temp src: Axillary (01/02 1449) BP: 121/69 mmHg (01/02 1449) Pulse Rate: 103  (01/02 1449)  Estimated Creatinine Clearance: 51.5 ml/min (by C-G formula based on Cr of 0.69).  Medical History: Past Medical History  Diagnosis Date  . Hyperlipidemia   . Hypothyroidism   . Spinal stenosis   . Lumbar disc disease   . Obesity   . Anemia, iron deficiency   . Cardiovascular disease   . Aortic stenosis   . Dyspnea   . Lung nodules   . Hypertensive heart disease without CHF   . Villous adenoma of rectum   . Heart murmur   . Coronary artery disease   . Hypertension   . Arthritis   . Dysrhythmia     Medications:  Prescriptions prior to admission  Medication Sig Dispense Refill  . aspirin EC 81 MG tablet Take 81 mg by mouth daily.        Marland Kitchen atorvastatin (LIPITOR) 20 MG tablet Take 20 mg by mouth at bedtime.        . Calcium Carbonate-Vitamin D (CALCIUM 600 + D PO) Take 1 tablet by mouth 2 (two) times daily.        . cholecalciferol (VITAMIN D) 400 UNITS TABS Take 400 Units by mouth daily.        Marland Kitchen docusate sodium (COLACE) 100 MG capsule Take 100 mg by mouth at bedtime.       . gabapentin (NEURONTIN) 300 MG capsule Take 300 mg by mouth 3 (three) times daily.        Marland Kitchen levothyroxine (SYNTHROID, LEVOTHROID) 75 MCG tablet Take 75 mcg by mouth daily.        Marland Kitchen losartan (COZAAR) 50 MG tablet Take 50 mg by mouth daily.        . Lutein 20 MG CAPS Take 20 mg by mouth daily.        . Multiple Vitamin (MULITIVITAMIN WITH MINERALS) TABS Take 1 tablet by mouth daily.          . Multiple Vitamins-Minerals (OCUVITE PRESERVISION PO) Take 1 tablet by mouth 2 (two) times daily.        Marland Kitchen OVER THE COUNTER MEDICATION Take 1 tablet by mouth daily. OTC MEDICATION: COLON HEALTH       . vitamin C (ASCORBIC ACID) 500 MG tablet Take 500 mg by mouth daily.          Assessment: 78 yo admitted with dyspnea.  She has a history of aortic valve disease and was taken to the cath lab for further cardiac work up.  Findings reveal severe left main disease and plan is for TCTS to evaluate for potential CABG.  We will start IV heparin for anticoagulation (low dose) as requested.  Goal of Therapy:  Heparin Level 0.3-0.5   Plan:  Begin IV heparin at 800 units/hr Check heparin level 8 hours after starting drip Monitor closely for s/s of bleeding. Daily Heparin level and CBC   Nadara Mustard, PharmD., MS Clinical Pharmacist [phone] 12/26/2011,5:12 PM

## 2011-12-26 NOTE — Progress Notes (Signed)
Patient ID: TANISH SINKLER, female   DOB: 22-Sep-1928, 78 y.o.   MRN: [id number]                    [address]            [place] [address]          [phone]       [facility] Medical Record #[id number] Date of Birth: 11-22-1928  Referring: Dr York Spaniel Primary Care: Hollice Espy, MD, MD  Chief Complaint:   SOB on excertion   History of Present Illness:     78 yo female with known aortic valve murmur for 10 years, presents with episodes of increasing SOB with exertion. On Christmas eve she developed sob and wheezing. She denies chest pain, syncope, near syncope, PND or pedal edema. She has no history of MI.   Current Activity/ Functional Status: Patient is independent with mobility/ambulation, transfers, ADL's, IADL's.   Past Medical History  Diagnosis Date  . Hyperlipidemia   . Hypothyroidism   . Spinal stenosis   . Lumbar disc disease   . Obesity   . Anemia, iron deficiency   . Cardiovascular disease   . Aortic stenosis   . Dyspnea   . Lung nodules evauated by Dr wert with PET told was "ok"  . Hypertensive heart disease without CHF   . Villous adenoma of rectum   . Heart murmur   . Coronary artery disease   . Hypertension   . Arthritis   . Dysrhythmia     Past Surgical History  Procedure Date  . Partial colectomy     Villous adenoma  . Hernia repair   . Lumbar laminectomy   . Cholecystectomy   . Cardiac catheterization 2013  . Back surgery   . Colon surgery     History  Smoking status  . Former Smoker -- 0.5 packs/day for 10 years  . Types: Cigarettes  . Quit date: 12/24/1976  Smokeless tobacco  . Never Used    History  Alcohol Use  . 4.2 oz/week  . 7 Glasses of wine per week    occ    History   Social History  . Marital Status: Married    Spouse Name: N/A    Number of Children: N/A  . Years of Education: N/A   Occupational History  . retired Solicitor    Social History Main Topics  .  Smoking status: Former Smoker -- 0.5 packs/day for 10 years    Types: Cigarettes    Quit date: 12/24/1978  . Smokeless tobacco: Never Used  . Alcohol Use: 4.2 oz/week    7 Glasses of wine per week     occ  . Drug Use: No  . Sexually Active: Yes   Other Topics Concern  . Not on file   Social History Narrative  . No narrative on file    Allergies  Allergen Reactions  . Ibuprofen     Hx of GI bleed  . Tape Rash    Reaction:Blisters (can use paper tape)    Current Facility-Administered Medications  Medication Dose Route Frequency Provider Last Rate Last Dose  . 0.9 %  sodium chloride infusion  1 mL/kg/hr Intravenous Continuous W Ashley Royalty., MD 65.8 mL/hr at 12/26/11 1413 1 mL/kg/hr at 12/26/11 1413  . acetaminophen (TYLENOL) tablet 650 mg  650 mg Oral Q4H PRN W Ashley Royalty., MD      .  aspirin EC tablet 81 mg  81 mg Oral Daily W Ashley Royalty., MD      . cholecalciferol (VITAMIN D) tablet 400 Units  400 Units Oral Daily W Ashley Royalty., MD      . diazepam (VALIUM) tablet 5 mg  5 mg Oral On Call W Ashley Royalty., MD   5 mg at 12/26/11 1021  . docusate sodium (COLACE) capsule 100 mg  100 mg Oral QHS W Ashley Royalty., MD      . gabapentin (NEURONTIN) capsule 300 mg  300 mg Oral TID W Ashley Royalty., MD   300 mg at 12/26/11 1726  . heparin 1000 UNIT/ML injection           . heparin 2-0.9 UNIT/ML-% infusion           . heparin ADULT infusion 100 units/ml (25000 units/250 ml)  800 Units/hr Intravenous Continuous Nita Altamese [place], PHARMD      . levothyroxine (SYNTHROID, LEVOTHROID) tablet 75 mcg  75 mcg Oral Daily W Ashley Royalty., MD      . lidocaine (XYLOCAINE) 1 % injection           . losartan (COZAAR) tablet 50 mg  50 mg Oral Daily W Ashley Royalty., MD      . midazolam (VERSED) 2 MG/2ML injection           . mulitivitamin with minerals tablet 1 tablet  1 tablet Oral Daily W Ashley Royalty., MD      . nitroGLYCERIN (NTG ON-CALL) 0.2  mg/mL injection           . rosuvastatin (CRESTOR) tablet 20 mg  20 mg Oral QHS W Ashley Royalty., MD      . vitamin C (ASCORBIC ACID) tablet 500 mg  500 mg Oral Daily W Ashley Royalty., MD      . DISCONTD: 0.9 %  sodium chloride infusion   Intravenous Continuous W Ashley Royalty., MD 20 mL/hr at 12/26/11 1021    . DISCONTD: enoxaparin (LOVENOX) injection 40 mg  40 mg Subcutaneous Q24H W Ashley Royalty., MD      . DISCONTD: enoxaparin (LOVENOX) injection 40 mg  40 mg Subcutaneous Q24H W Ashley Royalty., MD      . DISCONTD: Lutein CAPS 20 mg  20 mg Oral Daily W Ashley Royalty., MD        Prescriptions prior to admission  Medication Sig Dispense Refill  . aspirin EC 81 MG tablet Take 81 mg by mouth daily.        Marland Kitchen atorvastatin (LIPITOR) 20 MG tablet Take 20 mg by mouth at bedtime.        . Calcium Carbonate-Vitamin D (CALCIUM 600 + D PO) Take 1 tablet by mouth 2 (two) times daily.        . cholecalciferol (VITAMIN D) 400 UNITS TABS Take 400 Units by mouth daily.        Marland Kitchen docusate sodium (COLACE) 100 MG capsule Take 100 mg by mouth at bedtime.       . gabapentin (NEURONTIN) 300 MG capsule Take 300 mg by mouth 3 (three) times daily.        Marland Kitchen levothyroxine (SYNTHROID, LEVOTHROID) 75 MCG tablet Take 75 mcg by mouth daily.        Marland Kitchen losartan (COZAAR) 50 MG tablet Take 50 mg by mouth daily.        . Lutein 20 MG CAPS Take 20 mg  by mouth daily.        . Multiple Vitamin (MULITIVITAMIN WITH MINERALS) TABS Take 1 tablet by mouth daily.        . Multiple Vitamins-Minerals (OCUVITE PRESERVISION PO) Take 1 tablet by mouth 2 (two) times daily.        Marland Kitchen OVER THE COUNTER MEDICATION Take 1 tablet by mouth daily. OTC MEDICATION: COLON HEALTH       . vitamin C (ASCORBIC ACID) 500 MG tablet Take 500 mg by mouth daily.          Family History  Problem Relation Age of Onset  . Lymphoma Sister   . Cancer Sister   . Cancer Mother Died 34 unknown Cancer  . Heart attack Father   . Heart disease  Father Died 55   Patient son had AVR tissue Valve three years ago age 51   Review of Systems:     Cardiac Review of Systems: Y or N  Chest Pain [   n ]  Resting SOB [n   ] Exertional SOB  [ y ]  Pollyann Kennedy Milo.Brash  ]   Pedal Edema [ n  ]    Palpitations [n  ] Syncope  [n  ]   Presyncope [n   ]  General Review of Systems: [Y] = yes [  ]=no Constitional: recent weight change [  ]; anorexia [  ]; fatigue [  ]; nausea [  ]; night sweats [  ]; fever [  ]; or chills [  ];                                                                                                                                          Dental: poor dentition[n  ];full upper and lower plates  Eye : blurred vision [  ]; diplopia [   ]; vision changes [n  ];  Amaurosis fugax[ n ]; Resp: cough [  ];  wheezing[  ];  hemoptysis[  ]; shortness of breath[  ]; paroxysmal nocturnal dyspnea[  ]; dyspnea on exertion[  ]; or orthopnea[  ];  GI:  gallstones[  ], vomiting[  ];  dysphagia[  ]; melena[  ];  hematochezia [  ]; heartburn[  ];   Hx of  Colonoscopy[y  ]; GU: kidney stones [  ]; hematuria[  ];   dysuria [  ];  nocturia[  ];  history of     obstruction [  ];             Skin: rash, swelling[  ];, hair loss[  ];  peripheral edema[  ];  or itching[  ]; Musculosketetal: myalgias[  ];  joint swelling[  ];  joint erythema[  ];  joint pain[  ];  back pain[y  ];  Heme/Lymph: bruising[  ];  bleeding[  ];  anemia[  ];  Neuro: TIA[  ];  headaches[  ];  stroke[  ];  vertigo[  ];  seizures[  ];   paresthesias[  ];  difficulty walking[  ];  Psych:depression[  ]; anxiety[  ];  Endocrine: diabetes[ y ];  thyroid dysfunction[y  ];  Immunizations: Flu Cove.Etienne  ]; Pneumococcal[y  ];  Other:  Physical Exam: BP 121/69  Pulse 103  Temp(Src) 97.2 F (36.2 C) (Axillary)  Resp 18  Ht 5\' 4"  (1.626 m)  Wt 145 lb (65.772 kg)  BMI 24.89 kg/m2  SpO2 97%  General appearance: alert, cooperative, appears stated age, no distress and mildly obese Neurologic:  intact Heart: systolic murmur: holosystolic 4/6, buzzing throughout the precordium and no rub Lungs: clear to auscultation bilaterally Abdomen: soft, non-tender; bowel sounds normal; no masses,  no organomegaly and lower midline incision and rt upper quadtrant incision well healed Extremities: extremities normal, atraumatic, no cyanosis or edema, Homans sign is negative, no sign of DVT, no edema, redness or tenderness in the calves or thighs and no ulcers, gangrene or trophic changes palapble distal PT DP +2 bilaterial No carotid Bruits  Diagnostic Studies & Laboratory data:     Recent Radiology Findings:   No results found.    Recent Lab Findings: Lab Results  Component Value Date   WBC 5.3 12/26/2011   HGB 13.4 12/26/2011   HCT 40.2 12/26/2011   PLT 145* 12/26/2011   GLUCOSE 106* 12/26/2011   ALT 17 04/03/2011   ALT 17 04/03/2011   ALT 17 04/03/2011   AST 20 04/03/2011   AST 20 04/03/2011   AST 20 04/03/2011   NA 136 12/26/2011   K 3.9 12/26/2011   CL 103 12/26/2011   CREATININE 0.66 12/26/2011   BUN 17 12/26/2011   CO2 25 12/26/2011   TSH 2.526 01/20/2011   INR 0.98 01/20/2011    Lataisha R Alvizo  Oct 19, 1928 78 y.o.  [id number]  PROCEDURE: Right and left heart catheterization with selective coronary angiography, left ventriculogram.  INDICATIONS: Dyspnea, severe aortic stenosis on echocardiogram  The risks, benefits, and details of the procedure were explained to the patient. The patient verbalized understanding and wanted to proceed. Informed written consent was obtained.  PROCEDURE TECHNIQUE: After Xylocaine anesthesia a 54F sheath was placed in the right femoral vein. Right heart pressures were measured with a Swan-Ganz catheter, pulmonary artery saturation was measured, and thermodilution cardiac outputs were done. A 24F sheath was then placed in the right femoral artery with a single anterior needle wall stick. The aortic valve was crossed with a straight guidewire with only moderate difficulty.  A 25 cc ventriculogram was performed and a pullback gradient was measured across the aortic valve. Left coronary angiography was done using a Judkins L4 guide catheter. Right coronary angiography was done using a Judkins R4 guide catheter.  CONTRAST: Total of 80 cc.  COMPLICATIONS: None.  HEMODYNAMICS:  Right atrium: A. = 9, V. = 8, mean =5  Right ventricle: 29/3  Pulmonary artery: 29/8, mean = 17 Sat 62%  PCWP: A. =11, V=11  Aorta pre-contrast: 122/50 Sat 95%  LV precontrast: 162/0-11  Aortic valve area 0.55 cm    ANGIOGRAPHIC DATA:  CORONARY ARTERIES: Arise and distribute normally. Right dominant. Significant coronary calcification is noted in the ostium of the left main coronary artery as well as the length of the right coronary artery. It is also seen in the aorta.  Left main coronary artery: Severe calcification of the ostium. Significant catheter ventricularization is noted with damping on engagement. Angiographically appears to have a severe  ostial left main stenosis.  Left anterior descending: Mild calcification noted but appears to be a large vessel. No significant disease noted. .  Circumflex coronary artery: Mild irregularity.  Right coronary artery: Calcification throughout. Mid-vessel 40% stenosis. Severe ostial stenosis involving a small posterolateral branch.  LEFT VENTRICULOGRAM: Performed in the 30 RAO projection. The aortic valve is calcified with diminished opening. The left ventricle is normal in size. There appears to be global hypokinesis with an estimated ejection fraction of 30%.  IMPRESSIONS:  1. Severe aortic stenosis 2. Abnormal left ventricular function with global hypokinesis and ejection fraction 30% 3. Severe ostial left main coronary artery disease, moderate disease involving the right coronary artery 4. Normal pulmonary artery pressures  RECOMMENDATION: He will keep the patient the hospital from survey showed following catheterization due to severe ostial  left main disease and critical aortic stenosis with depressed LV function. Cardiac surgery consultation.Darden Palmer MD [facility]  ECHO in Dr Donnie Aho office not available  to review repot 50% ef av peak velocity 3.35m/sec AVA 0.7   Carotid Dopplers:Pre-CABG Carotids has been performed.  Right- No significant ICA stenosis. Left - There is a 40 to 59% ICA stenosis. Vertebral arteries are patent with antegrade flow.  Mila Homer  12/26/2011, 5:52 PM  *RADIOLOGY REPORT*  Clinical Data: Initial treatment strategy for lung nodules. Left  upper lobe pleural-based nodule.  NUCLEAR MEDICINE PET CT SKULL BASE TO THIGH  Technique: 16.5 mCi F-18 FDG was injected intravenously via the  right AC. Full-ring PET imaging was performed from the skull base  through the mid-thighs 56 minutes after injection. CT data was  obtained and used for attenuation correction and anatomic  localization only. (This was not acquired as a diagnostic CT  examination.)  Fasting Blood Glucose: 116  Patient Weight: 150 pounds.  Comparison: Chest CTs, including 10/17/2011. No prior PET.  Findings: PET images demonstrate a focus of hypermetabolism which  is felt to correspond to a small nodule node along the deep aspect  of the left parotid gland. This measures a S.U.V. max of 5.3 and 7  mm on image 32.  The pleural-based left upper lobe lung lesion measures 1.2 x 1.3 cm  and a S.U.V. max of 2.8 on image 65. On the 10/17/2011 CT, this  measured 1.3 x 1.3 cm. Appears more solid today, likely due to  differences in slice thickness. On the 07/13/2011 exam, this  measured 2.0 x 1.8 cm.  Prominent mediastinal leads demonstrate low level nonspecific  hypermetabolism. Subcarinal/azygo-esophageal recess node measures  1.1 cm and a S.U.V. max of 4.5 on image 82. On the prior exam of  10/17/2011, this node measured 7 mm. More cephalad subcarinal node  measures 1.1 cm and a S.U.V. max of 3.0 on image 79. 8 mm on  the  prior.  No abnormal activity within the abdomen or pelvis.  CT images performed for attenuation correction demonstrate no  further findings within the neck. Mild cardiomegaly. Mild  centrilobular emphysema.  Probable right renal collecting system calculus. Right renal  cysts. Normal adrenal glands. Cholecystectomy. Pelvic ventral  abdominal wall hernia repair.  IMPRESSION:  1. The pleural-based left upper lobe lung nodule is mildly  hypermetabolic. This measures similar in size to 10/17/2011 but  appears to have decreased in size since 07/13/2011. It is  therefore indeterminate, but may be infectious or inflammatory.  Potential clinical strategies would include aongoing surveillance  with CT versus tissue sampling.  2. Mediastinal nodes which  are slightly increased in size since  10/17/2011 and demonstrate mild low level hypermetabolism. Favored  to be reactive.  3. A left cervical focus of hypermetabolism which is felt to  correspond to a node or nodule along the deep aspect of the left  parotid gland. Correlate with any recent infection to suggest  reactive etiology. Consider dedicated contrast-enhanced neck CT  versus tissue sampling.  Original Report Authenticated By: Consuello Bossier, M.D.   Assessment / Plan:   Sever  Aortic Stenosis with CAD including left Main   40 to 59% ICA stenosis Left Carotid asymptotic Left Pleural Based Lung nodule thought by pulmonary service to be benign.- no tissue dx Hypermetabolic Lymph along left Parotid gland- ? Etiology  With progressive symptoms of Exertional SOB now known to have Sever AS and coronary disease I have recommended  proceeding with AVR and CABG, Pet scan does not demonstrate any widespread metastatic process so further evaluation of PET findings should wait. Now has significant LV dysfunction by cath EF was 50% in November  The goals risks and alternatives of the planned surgical procedure AVR & CABG have been discussed with  the patient in detail. The risks of the procedure including death, infection, stroke, myocardial infarction, bleeding, blood transfusion have all been discussed specifically.  I have quoted Justice Britain a 6 % of perioperative mortality and a complication rate as high as 40 %. The patient's questions have been answered.Ariba Lehnen Stauder is willing  to proceed with the planned procedure. Tentative plan for Friday.   Delight Ovens MD  Beeper [phone] Office [phone] 12/26/2011 9:17 PM

## 2011-12-26 NOTE — Op Note (Signed)
[name] 04-12-1928 78 y.o. [id number]  PROCEDURE:  Right and left heart catheterization with selective coronary angiography, left ventriculogram.  INDICATIONS: Dyspnea, severe aortic stenosis on echocardiogram  The risks, benefits, and details of the procedure were explained to the patient.  The patient verbalized understanding and wanted to proceed.  Informed written consent was obtained.  PROCEDURE TECHNIQUE:   After Xylocaine anesthesia a 1F sheath was placed in the right femoral vein. Right heart pressures were measured with a Swan-Ganz catheter, pulmonary artery saturation was measured, and thermodilution cardiac outputs were done.  A 77F sheath was then  placed in the right femoral artery with a single anterior needle wall stick.  The aortic valve was crossed with a straight guidewire with only moderate difficulty. A 25 cc ventriculogram was performed and a pullback gradient was measured across the aortic valve. Left coronary angiography was done using a Judkins L4 guide catheter.  Right coronary angiography was done using a Judkins R4 guide catheter.     CONTRAST:  Total of 80 cc.  COMPLICATIONS:  None.    HEMODYNAMICS:  Right atrium: A. = 9, V. = 8, mean =5 Right ventricle: 29/3  Pulmonary artery: 29/8, mean = 17  Sat 62%  PCWP: A. =11, V=11 Aorta pre-contrast: 122/50  Sat 95% LV precontrast: 162/0-11  Aortic valve area 0.55 cm   ANGIOGRAPHIC DATA:    CORONARY ARTERIES:   Arise and distribute normally.  Right dominant. Significant coronary calcification is noted in the ostium of the left main coronary artery as well as the length of the right coronary artery. It is also seen in the aorta.  Left main coronary artery: Severe calcification of the ostium. Significant catheter ventricularization is noted with damping on engagement. Angiographically appears to have a severe ostial left main stenosis.  Left anterior descending: Mild calcification noted but appears to be a  large vessel. No significant disease noted. .  Circumflex coronary artery: Mild irregularity.  Right coronary artery: Calcification throughout. Mid-vessel 40% stenosis. Severe ostial stenosis involving a small posterolateral branch.  LEFT VENTRICULOGRAM:  Performed in the 30 RAO projection.  The aortic valve is calcified with diminished opening. The left ventricle is normal in size. There appears to be global hypokinesis with an estimated ejection fraction of 30%.  IMPRESSIONS:  1. Severe aortic stenosis 2.  Abnormal left ventricular function with global hypokinesis and ejection fraction 30% 3. Severe ostial left main coronary artery disease, moderate disease involving the right coronary artery 4. Normal pulmonary artery pressures  RECOMMENDATION:  He will keep the patient the hospital from survey showed following catheterization due to severe ostial left main disease and critical aortic stenosis with depressed LV function. Cardiac surgery consultation.Darden Palmer MD [facility]

## 2011-12-26 NOTE — Progress Notes (Signed)
*  PRELIMINARY RESULTS*  Pre-CABG Carotids has been performed.  Right- No significant ICA stenosis. Left - There is a 40 to 59% ICA stenosis. Vertebral arteries are patent with antegrade flow.  Carol [name] 12/26/2011, 5:52 PM

## 2011-12-26 NOTE — H&P (Signed)
Date of Initial H&P:12/20/11 History reviewed, patient examined, no change in status, stable for surgery.   Darden Palmer MD [facility]

## 2011-12-27 ENCOUNTER — Encounter (HOSPITAL_COMMUNITY): Payer: Self-pay | Admitting: Cardiology

## 2011-12-27 ENCOUNTER — Ambulatory Visit: Payer: Medicare Other

## 2011-12-27 ENCOUNTER — Ambulatory Visit (HOSPITAL_COMMUNITY): Payer: Medicare Other

## 2011-12-27 DIAGNOSIS — Z01810 Encounter for preprocedural cardiovascular examination: Secondary | ICD-10-CM

## 2011-12-27 DIAGNOSIS — I359 Nonrheumatic aortic valve disorder, unspecified: Secondary | ICD-10-CM | POA: Diagnosis not present

## 2011-12-27 DIAGNOSIS — I4891 Unspecified atrial fibrillation: Secondary | ICD-10-CM | POA: Diagnosis not present

## 2011-12-27 DIAGNOSIS — I2510 Atherosclerotic heart disease of native coronary artery without angina pectoris: Secondary | ICD-10-CM | POA: Diagnosis not present

## 2011-12-27 LAB — URINALYSIS, ROUTINE W REFLEX MICROSCOPIC
Bilirubin Urine: NEGATIVE
Glucose, UA: NEGATIVE mg/dL
Hgb urine dipstick: NEGATIVE
Ketones, ur: NEGATIVE mg/dL
Nitrite: NEGATIVE
Protein, ur: NEGATIVE mg/dL
Specific Gravity, Urine: 1.007 (ref 1.005–1.030)
Urobilinogen, UA: 0.2 mg/dL (ref 0.0–1.0)
pH: 6.5 (ref 5.0–8.0)

## 2011-12-27 LAB — PROTIME-INR
INR: 1 (ref 0.00–1.49)
Prothrombin Time: 13.4 s (ref 11.6–15.2)

## 2011-12-27 LAB — APTT: aPTT: 71 s — ABNORMAL HIGH (ref 24–37)

## 2011-12-27 LAB — URINE MICROSCOPIC-ADD ON

## 2011-12-27 LAB — HEPARIN LEVEL (UNFRACTIONATED)
Heparin Unfractionated: 0.16 IU/mL — ABNORMAL LOW (ref 0.30–0.70)
Heparin Unfractionated: 0.32 IU/mL (ref 0.30–0.70)

## 2011-12-27 LAB — SURGICAL PCR SCREEN
MRSA, PCR: NEGATIVE
Staphylococcus aureus: NEGATIVE

## 2011-12-27 LAB — HEMOGLOBIN A1C
Hgb A1c MFr Bld: 4.8 % (ref <5.7)
Mean Plasma Glucose: 91 mg/dL (ref <117)

## 2011-12-27 MED ORDER — CHLORHEXIDINE GLUCONATE 4 % EX LIQD
60.0000 mL | Freq: Once | CUTANEOUS | Status: AC
  Administered 2011-12-28: 4 via TOPICAL

## 2011-12-27 MED ORDER — BISACODYL 5 MG PO TBEC
5.0000 mg | DELAYED_RELEASE_TABLET | Freq: Once | ORAL | Status: DC
  Filled 2011-12-27: qty 1

## 2011-12-27 MED ORDER — DEXTROSE 5 % IV SOLN
750.0000 mg | INTRAVENOUS | Status: DC
  Filled 2011-12-27 (×2): qty 750

## 2011-12-27 MED ORDER — VANCOMYCIN HCL 1000 MG IV SOLR
1250.0000 mg | INTRAVENOUS | Status: DC
  Filled 2011-12-27 (×2): qty 1250

## 2011-12-27 MED ORDER — SODIUM CHLORIDE 0.9 % IV SOLN
0.1000 ug/kg/h | INTRAVENOUS | Status: DC
  Filled 2011-12-27: qty 4

## 2011-12-27 MED ORDER — PLASMA-LYTE 148 IV SOLN
INTRAVENOUS | Status: AC
  Administered 2011-12-28: 09:00:00
  Filled 2011-12-27: qty 0.5

## 2011-12-27 MED ORDER — ALBUTEROL SULFATE (5 MG/ML) 0.5% IN NEBU
2.5000 mg | INHALATION_SOLUTION | Freq: Once | RESPIRATORY_TRACT | Status: AC
  Administered 2011-12-27: 2.5 mg via RESPIRATORY_TRACT

## 2011-12-27 MED ORDER — CHLORHEXIDINE GLUCONATE 4 % EX LIQD: 60.0000 mL | Freq: Once | CUTANEOUS | Status: DC

## 2011-12-27 MED ORDER — EPINEPHRINE HCL 1 MG/ML IJ SOLN
0.5000 ug/min | INTRAVENOUS | Status: DC
  Filled 2011-12-27: qty 4

## 2011-12-27 MED ORDER — METOPROLOL TARTRATE 12.5 MG HALF TABLET
12.5000 mg | ORAL_TABLET | Freq: Once | ORAL | Status: AC
  Administered 2011-12-28: 12.5 mg via ORAL
  Filled 2011-12-27: qty 1

## 2011-12-27 MED ORDER — CHLORHEXIDINE GLUCONATE 4 % EX LIQD
60.0000 mL | Freq: Once | CUTANEOUS | Status: AC
  Administered 2011-12-27: 4 via TOPICAL
  Filled 2011-12-27: qty 90

## 2011-12-27 MED ORDER — TEMAZEPAM 15 MG PO CAPS
15.0000 mg | ORAL_CAPSULE | Freq: Once | ORAL | Status: AC | PRN
  Administered 2011-12-27: 15 mg via ORAL
  Filled 2011-12-27: qty 1

## 2011-12-27 MED ORDER — SODIUM CHLORIDE 0.9 % IV SOLN
INTRAVENOUS | Status: DC
  Filled 2011-12-27: qty 1

## 2011-12-27 MED ORDER — DEXTROSE 5 % IV SOLN
1.5000 g | INTRAVENOUS | Status: DC
  Filled 2011-12-27: qty 1.5

## 2011-12-27 MED ORDER — ALPRAZOLAM 0.25 MG PO TABS
0.2500 mg | ORAL_TABLET | ORAL | Status: DC | PRN
  Administered 2011-12-28: 0.25 mg via ORAL
  Filled 2011-12-27: qty 1

## 2011-12-27 MED ORDER — NITROGLYCERIN IN D5W 200-5 MCG/ML-% IV SOLN
2.0000 ug/min | INTRAVENOUS | Status: DC
  Filled 2011-12-27: qty 250

## 2011-12-27 MED ORDER — POTASSIUM CHLORIDE 2 MEQ/ML IV SOLN
80.0000 meq | INTRAVENOUS | Status: DC
  Filled 2011-12-27: qty 40

## 2011-12-27 MED ORDER — MAGNESIUM SULFATE 50 % IJ SOLN
40.0000 meq | INTRAMUSCULAR | Status: DC
  Filled 2011-12-27: qty 10

## 2011-12-27 MED ORDER — DOPAMINE-DEXTROSE 3.2-5 MG/ML-% IV SOLN
2.0000 ug/kg/min | INTRAVENOUS | Status: DC
  Filled 2011-12-27: qty 250

## 2011-12-27 MED ORDER — SODIUM CHLORIDE 0.9 % IV SOLN
INTRAVENOUS | Status: DC
  Filled 2011-12-27: qty 40

## 2011-12-27 MED ORDER — DEXTROSE 5 % IV SOLN
30.0000 ug/min | INTRAVENOUS | Status: DC
  Filled 2011-12-27: qty 2

## 2011-12-27 NOTE — Progress Notes (Signed)
VASCULAR LAB PRELIMINARY  PRELIMINARY  PRELIMINARY  PRELIMINARY  Pre-op Cardiac Surgery  Carotid Findings:  Pre-CABG Carotids has been performed.  Right- No significant ICA stenosis. Left - There is a 40 to 59% ICA stenosis. Vertebral arteries are patent with antegrade flow.      Rod Mae, RDMS   Upper Extremity Right Left  Brachial Pressures 154 biphasic 129 biphasic  Radial Waveforms triphasic triphasic  Ulnar Waveforms triphasic triphasic  Palmar Arch (Allen's Test)     Findings:  Bilateral:  Normal Allen's test.    Lower  Extremity Right Left  Dorsalis Pedis 91 monophasic 81 monophasic  Anterior Tibial    Posterior Tibial 41 monophasic absent  Ankle/Brachial Indices 0.59 0.53    Findings:  Right ABI moderate decrease.  Left ABI severe decrease.   Terance Hart, RVT 12/27/2011, 3:44 PM

## 2011-12-27 NOTE — Progress Notes (Signed)
UR Completed.  Mushka Laconte Jane [phone] 12/27/2011  

## 2011-12-27 NOTE — Progress Notes (Signed)
ANTICOAGULATION CONSULT NOTE - Follow Up Consult  Pharmacy Consult for heparin Indication: CAD awaiting CABG + AVR  Patient Measurements: Height: 5\' 4"  (162.6 cm) Weight: 145 lb (65.772 kg) IBW/kg (Calculated) : 54.7   Vital Signs: Temp: 97.8 F (36.6 C) (01/03 1400) Temp src: Oral (01/03 1400) BP: 107/76 mmHg (01/03 1400) Pulse Rate: 112  (01/03 1400)  Labs:  Basename 12/27/11 1818 12/27/11 0639 12/26/11 1840  HGB -- -- 13.4  HCT -- -- 40.2  PLT -- -- 145*  APTT 71* -- --  LABPROT 13.4 -- --  INR 1.00 -- --  HEPARINUNFRC 0.32 0.16* --  CREATININE -- -- 0.66  CKTOTAL -- -- --  CKMB -- -- --  TROPONINI -- -- --   Estimated Creatinine Clearance: 51.5 ml/min (by C-G formula based on Cr of 0.66).   Medications:  Infusions:     . sodium chloride 1 mL/kg/hr (12/26/11 1413)  . heparin 1,050 Units/hr (12/27/11 4098)    Assessment: 83 yof anticoagulated on IV heparin s/p cardiac cath. Plan for CABG + AVR tomorrow. Heparin level now 0.32 in goal range.  Goal of Therapy:  Heparin level 0.3-0.5   Plan:  Continue heparin at 1050 units/hr until surgery. Merilynn Finland, [employer] 12/27/2011,7:15 PM

## 2011-12-27 NOTE — Progress Notes (Signed)
ANTICOAGULATION CONSULT NOTE - Follow Up Consult  Pharmacy Consult for heparin Indication: CAD awaiting CABG + AVR  Patient Measurements: Height: 5\' 4"  (162.6 cm) Weight: 145 lb (65.772 kg) IBW/kg (Calculated) : 54.7   Vital Signs: Temp: 97.4 F (36.3 C) (01/03 0500) BP: 113/78 mmHg (01/03 0500) Pulse Rate: 101  (01/03 0500)  Labs:  Basename 12/27/11 0639 12/26/11 1840  HGB -- 13.4  HCT -- 40.2  PLT -- 145*  APTT -- --  LABPROT -- --  INR -- --  HEPARINUNFRC 0.16* --  CREATININE -- 0.66  CKTOTAL -- --  CKMB -- --  TROPONINI -- --   Estimated Creatinine Clearance: 51.5 ml/min (by C-G formula based on Cr of 0.66).   Medications:  Infusions:    . sodium chloride 1 mL/kg/hr (12/26/11 1413)  . heparin 800 Units/hr (12/26/11 2241)  . DISCONTD: sodium chloride 20 mL/hr at 12/26/11 1021    Assessment: 83 yof anticoagulated on IV heparin s/p cardiac cath. Plan for CABG + AVR tomorrow. Heparin level is subtherapeutic. No bleeding noted, very slightly thrombocytopenic.   Goal of Therapy:  Heparin level 0.3-0.5   Plan:  Increase heparin to 1050units/hr Check an 8 hour heparin level  Sitara Cashwell, Drake Leach 12/27/2011,9:25 AM

## 2011-12-27 NOTE — Progress Notes (Signed)
Patient ID: KHRISTIN KELEHER, female   DOB: [birth year], 78 y.o.   MRN: [id number]                   [address]            [place] [address]          [phone]     1 Day Post-Op Procedure(s) (LRB): LEFT AND RIGHT HEART CATHETERIZATION WITH CORONARY ANGIOGRAM (N/A)  LOS: 1 day   Subjective: Just back from PFTS, no complaints no further questions about surgery  Objective: Vital signs in last 24 hours: Patient Vitals for the past 24 hrs:  BP Temp Temp src Pulse Resp SpO2 Height Weight  12/27/11 0500 113/78 mmHg 97.4 F (36.3 C) - 101  20  95 % - -  12/26/11 2100 117/56 mmHg 97.7 F (36.5 C) - 83  16  95 % - -  12/26/11 1449 121/69 mmHg 97.2 F (36.2 C) Axillary 103  18  97 % - -  12/26/11 1155 - - - 93  - - - -  12/26/11 1015 105/54 mmHg 96.7 F (35.9 C) Oral - 18  96 % 5\' 4"  (1.626 m) 145 lb (65.772 kg)   Wt Readings from Last 3 Encounters:  12/26/11 145 lb (65.772 kg)  12/26/11 145 lb (65.772 kg)  11/07/11 154 lb 12.8 oz (70.217 kg)    Hemodynamic parameters for last 24 hours:    Intake/Output from previous day: 01/02 0701 - 01/03 0700 In: 280 [P.O.:280] Out: -  Intake/Output this shift:    Scheduled Meds:   . albuterol  2.5 mg Nebulization Once  . aspirin EC  81 mg Oral Daily  . bisacodyl  5 mg Oral Once  . chlorhexidine  60 mL Topical Once  . chlorhexidine  60 mL Topical Once  . chlorhexidine  60 mL Topical Once  . cholecalciferol  400 Units Oral Daily  . diazepam  5 mg Oral On Call  . docusate sodium  100 mg Oral QHS  . gabapentin  300 mg Oral TID  . heparin      . heparin      . levothyroxine  75 mcg Oral Daily  . lidocaine      . losartan  50 mg Oral Daily  . metoprolol tartrate  12.5 mg Oral Once  . midazolam      . mulitivitamin with minerals  1 tablet Oral Daily  . nitroGLYCERIN      . rosuvastatin  20 mg Oral QHS  . vitamin C  500 mg Oral Daily  . DISCONTD: enoxaparin  40 mg Subcutaneous Q24H  . DISCONTD: enoxaparin  40  mg Subcutaneous Q24H  . DISCONTD: Lutein  20 mg Oral Daily   Continuous Infusions:   . sodium chloride 1 mL/kg/hr (12/26/11 1413)  . heparin 800 Units/hr (12/26/11 2241)  . DISCONTD: sodium chloride 20 mL/hr at 12/26/11 1021   PRN Meds:.acetaminophen, ALPRAZolam, diphenhydrAMINE, ondansetron (ZOFRAN) IV, temazepam, DISCONTD: acetaminophen  General appearance: alert and cooperative Neurologic: intact Heart: systolic murmur: holosystolic 4/6, harsh throughout the precordium Extremities: extremities normal, atraumatic, no cyanosis or edema, Homans sign is negative, no sign of DVT and no edema, redness or tenderness in the calves or thighs  Lab Results: CBC: Basename 12/26/11 1840  WBC 5.3  HGB 13.4  HCT 40.2  PLT 145*   BMET:  Basename 12/26/11 1840  NA 136  K 3.9  CL 103  CO2 25  GLUCOSE  106*  BUN 17  CREATININE 0.66  CALCIUM 9.3    PT/INR: No results found for this basename: LABPROT,INR in the last 72 hours   Radiology No results found.   Assessment/Plan: S/P Procedure(s) (LRB): LEFT AND RIGHT HEART CATHETERIZATION WITH CORONARY ANGIOGRAM (N/A) For CABG AVR in AM Risks and options discussed yesterday, Patient agreeable  Delight Ovens MD 12/27/2011 9:12 AM

## 2011-12-27 NOTE — Progress Notes (Signed)
Subjective:  No c/o today.  Anxious about surgery but questions answered.  Discussed with Gr. Gerhardt yesterday.  Objective:  Vital Signs in the last 24 hours: BP 113/78  Pulse 101  Temp(Src) 97.4 F (36.3 C) (Axillary)  Resp 20  Ht 5\' 4"  (1.626 m)  Wt 65.772 kg (145 lb)  BMI 24.89 kg/m2  SpO2 95%  Physical Exam: Pleasant WF in NAD, appears younger than stated age Lungs:  Clear to A&P Cardiac:  Regular rhythm, normal S1 and S2, no S3, 3/6y murmur of AS Abdomen:  Soft, nontender, no masses Extremities:  Cath site clean and dry  Intake/Output from previous day: 01/02 0701 - 01/03 0700 In: 280 [P.O.:280] Out: -   Lab Results: Basic Metabolic Panel:  [facility] 12/26/11 1840  NA 136  K 3.9  CL 103  CO2 25  GLUCOSE 106*  BUN 17  CREATININE 0.66    CBC:  Basename 12/26/11 1840  WBC 5.3  NEUTROABS --  HGB 13.4  HCT 40.2  MCV 87.8  PLT 145*    PROTIME: Lab Results  Component Value Date   INR 0.98 01/20/2011    Telemetry: Reviewed  In sinus with occasional PVC  Assessment/Plan:  1. Severe AS 2. Severe LM disease  Rec:    OK with surgery. Questions answered. SHe has a lot of PAC's and may have increased risk of a fib with surgery.     Darden Palmer.  MD [facility] 12/27/2011, 10:36 AM

## 2011-12-28 ENCOUNTER — Encounter (HOSPITAL_COMMUNITY): Payer: Self-pay | Admitting: Anesthesiology

## 2011-12-28 ENCOUNTER — Inpatient Hospital Stay (HOSPITAL_COMMUNITY): Payer: Medicare Other

## 2011-12-28 ENCOUNTER — Other Ambulatory Visit: Payer: Self-pay | Admitting: Cardiothoracic Surgery

## 2011-12-28 ENCOUNTER — Encounter (HOSPITAL_COMMUNITY)
Admission: RE | Disposition: A | Discharge status: Home Health | Payer: Self-pay | Source: Ambulatory Visit | Attending: Cardiothoracic Surgery

## 2011-12-28 ENCOUNTER — Inpatient Hospital Stay (HOSPITAL_COMMUNITY): Payer: Medicare Other | Admitting: Anesthesiology

## 2011-12-28 DIAGNOSIS — I359 Nonrheumatic aortic valve disorder, unspecified: Secondary | ICD-10-CM

## 2011-12-28 DIAGNOSIS — Z09 Encounter for follow-up examination after completed treatment for conditions other than malignant neoplasm: Secondary | ICD-10-CM | POA: Diagnosis not present

## 2011-12-28 DIAGNOSIS — R222 Localized swelling, mass and lump, trunk: Secondary | ICD-10-CM | POA: Diagnosis not present

## 2011-12-28 DIAGNOSIS — Z952 Presence of prosthetic heart valve: Secondary | ICD-10-CM

## 2011-12-28 DIAGNOSIS — I4892 Unspecified atrial flutter: Secondary | ICD-10-CM | POA: Diagnosis not present

## 2011-12-28 DIAGNOSIS — Z951 Presence of aortocoronary bypass graft: Secondary | ICD-10-CM | POA: Diagnosis not present

## 2011-12-28 DIAGNOSIS — D381 Neoplasm of uncertain behavior of trachea, bronchus and lung: Secondary | ICD-10-CM

## 2011-12-28 DIAGNOSIS — I2510 Atherosclerotic heart disease of native coronary artery without angina pectoris: Secondary | ICD-10-CM

## 2011-12-28 DIAGNOSIS — D62 Acute posthemorrhagic anemia: Secondary | ICD-10-CM | POA: Diagnosis not present

## 2011-12-28 DIAGNOSIS — J8410 Pulmonary fibrosis, unspecified: Secondary | ICD-10-CM | POA: Diagnosis not present

## 2011-12-28 DIAGNOSIS — E871 Hypo-osmolality and hyponatremia: Secondary | ICD-10-CM | POA: Diagnosis not present

## 2011-12-28 DIAGNOSIS — I10 Essential (primary) hypertension: Secondary | ICD-10-CM | POA: Diagnosis not present

## 2011-12-28 DIAGNOSIS — R918 Other nonspecific abnormal finding of lung field: Secondary | ICD-10-CM | POA: Diagnosis not present

## 2011-12-28 HISTORY — DX: Presence of prosthetic heart valve: Z95.2

## 2011-12-28 HISTORY — PX: AORTIC VALVE REPLACEMENT: SHX41

## 2011-12-28 HISTORY — PX: CORONARY ARTERY BYPASS GRAFT: SHX141

## 2011-12-28 LAB — POCT I-STAT 3, ART BLOOD GAS (G3+)
Acid-Base Excess: 2 mmol/L (ref 0.0–2.0)
Acid-base deficit: 4 mmol/L — ABNORMAL HIGH (ref 0.0–2.0)
Acid-base deficit: 7 mmol/L — ABNORMAL HIGH (ref 0.0–2.0)
Acid-base deficit: 7 mmol/L — ABNORMAL HIGH (ref 0.0–2.0)
Acid-base deficit: 3 mmol/L — ABNORMAL HIGH (ref 0.0–2.0)
Acid-base deficit: 1 mmol/L (ref 0.0–2.0)
Acid-base deficit: 1 mmol/L (ref 0.0–2.0)
Bicarbonate: 24.7 meq/L — ABNORMAL HIGH (ref 20.0–24.0)
Bicarbonate: 18 mEq/L — ABNORMAL LOW (ref 20.0–24.0)
Bicarbonate: 19.7 mEq/L — ABNORMAL LOW (ref 20.0–24.0)
Bicarbonate: 22.8 mEq/L (ref 20.0–24.0)
Bicarbonate: 22 mEq/L (ref 20.0–24.0)
Bicarbonate: 23 mEq/L (ref 20.0–24.0)
Bicarbonate: 25.8 mEq/L — ABNORMAL HIGH (ref 20.0–24.0)
Bicarbonate: 24.8 mEq/L — ABNORMAL HIGH (ref 20.0–24.0)
O2 Saturation: 100 %
O2 Saturation: 100 %
O2 Saturation: 95 %
O2 Saturation: 99 %
O2 Saturation: 98 %
O2 Saturation: 96 %
O2 Saturation: 94 %
O2 Saturation: 94 %
Patient temperature: 30
Patient temperature: 37
Patient temperature: 37
Patient temperature: 37
Patient temperature: 37
Patient temperature: 36.2
Patient temperature: 37.5
Patient temperature: 37.3
TCO2: 26 mmol/L (ref 0–100)
TCO2: 19 mmol/L (ref 0–100)
TCO2: 21 mmol/L (ref 0–100)
TCO2: 24 mmol/L (ref 0–100)
TCO2: 23 mmol/L (ref 0–100)
TCO2: 24 mmol/L (ref 0–100)
TCO2: 27 mmol/L (ref 0–100)
TCO2: 26 mmol/L (ref 0–100)
pCO2 arterial: 22.8 mmHg — ABNORMAL LOW (ref 35.0–45.0)
pCO2 arterial: 34.5 mmHg — ABNORMAL LOW (ref 35.0–45.0)
pCO2 arterial: 43.1 mmHg (ref 35.0–45.0)
pCO2 arterial: 52.1 mmHg — ABNORMAL HIGH (ref 35.0–45.0)
pCO2 arterial: 38.3 mmHg (ref 35.0–45.0)
pCO2 arterial: 33.5 mmHg — ABNORMAL LOW (ref 35.0–45.0)
pCO2 arterial: 44.6 mmHg (ref 35.0–45.0)
pCO2 arterial: 47.4 mmHg — ABNORMAL HIGH (ref 35.0–45.0)
pH, Arterial: 7.618 (ref 7.350–7.400)
pH, Arterial: 7.249 — ABNORMAL LOW (ref 7.350–7.400)
pH, Arterial: 7.267 — ABNORMAL LOW (ref 7.350–7.400)
pH, Arterial: 7.325 — ABNORMAL LOW (ref 7.350–7.400)
pH, Arterial: 7.367 (ref 7.350–7.400)
pH, Arterial: 7.44 — ABNORMAL HIGH (ref 7.350–7.400)
pH, Arterial: 7.372 (ref 7.350–7.400)
pH, Arterial: 7.329 — ABNORMAL LOW (ref 7.350–7.400)
pO2, Arterial: 334 mmHg — ABNORMAL HIGH (ref 80.0–100.0)
pO2, Arterial: 160 mmHg — ABNORMAL HIGH (ref 80.0–100.0)
pO2, Arterial: 396 mmHg — ABNORMAL HIGH (ref 80.0–100.0)
pO2, Arterial: 86 mmHg (ref 80.0–100.0)
pO2, Arterial: 108 mmHg — ABNORMAL HIGH (ref 80.0–100.0)
pO2, Arterial: 76 mmHg — ABNORMAL LOW (ref 80.0–100.0)
pO2, Arterial: 77 mmHg — ABNORMAL LOW (ref 80.0–100.0)
pO2, Arterial: 77 mmHg — ABNORMAL LOW (ref 80.0–100.0)

## 2011-12-28 LAB — POCT I-STAT 3, VENOUS BLOOD GAS (G3P V)
Acid-base deficit: 2 mmol/L (ref 0.0–2.0)
Bicarbonate: 21.6 mEq/L (ref 20.0–24.0)
O2 Saturation: 79 %
Patient temperature: 30
TCO2: 23 mmol/L (ref 0–100)
pCO2, Ven: 23.7 mmHg — ABNORMAL LOW (ref 45.0–50.0)
pH, Ven: 7.538 — ABNORMAL HIGH (ref 7.250–7.300)
pO2, Ven: 25 mmHg — CL (ref 30.0–45.0)

## 2011-12-28 LAB — POCT I-STAT 4, (NA,K, GLUC, HGB,HCT)
Glucose, Bld: 149 mg/dL — ABNORMAL HIGH (ref 70–99)
Glucose, Bld: 139 mg/dL — ABNORMAL HIGH (ref 70–99)
Glucose, Bld: 107 mg/dL — ABNORMAL HIGH (ref 70–99)
Glucose, Bld: 119 mg/dL — ABNORMAL HIGH (ref 70–99)
Glucose, Bld: 251 mg/dL — ABNORMAL HIGH (ref 70–99)
Glucose, Bld: 178 mg/dL — ABNORMAL HIGH (ref 70–99)
Glucose, Bld: 222 mg/dL — ABNORMAL HIGH (ref 70–99)
HCT: 34 % — ABNORMAL LOW (ref 36.0–46.0)
HCT: 32 % — ABNORMAL LOW (ref 36.0–46.0)
HCT: 19 % — ABNORMAL LOW (ref 36.0–46.0)
HCT: 16 % — ABNORMAL LOW (ref 36.0–46.0)
HCT: 18 % — ABNORMAL LOW (ref 36.0–46.0)
HCT: 25 % — ABNORMAL LOW (ref 36.0–46.0)
HCT: 32 % — ABNORMAL LOW (ref 36.0–46.0)
Hemoglobin: 11.6 g/dL — ABNORMAL LOW (ref 12.0–15.0)
Hemoglobin: 10.9 g/dL — ABNORMAL LOW (ref 12.0–15.0)
Hemoglobin: 6.5 g/dL — CL (ref 12.0–15.0)
Hemoglobin: 5.4 g/dL — CL (ref 12.0–15.0)
Hemoglobin: 6.1 g/dL — CL (ref 12.0–15.0)
Hemoglobin: 10.9 g/dL — ABNORMAL LOW (ref 12.0–15.0)
Hemoglobin: 8.5 g/dL — ABNORMAL LOW (ref 12.0–15.0)
Potassium: 4.2 mEq/L (ref 3.5–5.1)
Potassium: 4.2 mEq/L (ref 3.5–5.1)
Potassium: 4.8 meq/L (ref 3.5–5.1)
Potassium: 5.1 mEq/L (ref 3.5–5.1)
Potassium: 3.5 mEq/L (ref 3.5–5.1)
Potassium: 3.3 mEq/L — ABNORMAL LOW (ref 3.5–5.1)
Potassium: 3.7 meq/L (ref 3.5–5.1)
Sodium: 138 mEq/L (ref 135–145)
Sodium: 137 mEq/L (ref 135–145)
Sodium: 133 meq/L — ABNORMAL LOW (ref 135–145)
Sodium: 139 mEq/L (ref 135–145)
Sodium: 143 mEq/L (ref 135–145)
Sodium: 143 mEq/L (ref 135–145)
Sodium: 144 meq/L (ref 135–145)

## 2011-12-28 LAB — CREATININE, SERUM
Creatinine, Ser: 0.55 mg/dL (ref 0.50–1.10)
GFR calc Af Amer: >90 mL/min (ref >90)
GFR calc non Af Amer: 86 mL/min — ABNORMAL LOW (ref >90)

## 2011-12-28 LAB — BASIC METABOLIC PANEL
BUN: 15 mg/dL (ref 6–23)
CO2: 22 mEq/L (ref 19–32)
Calcium: 9.9 mg/dL (ref 8.4–10.5)
Chloride: 101 mEq/L (ref 96–112)
Creatinine, Ser: 0.63 mg/dL (ref 0.50–1.10)
GFR calc Af Amer: >90 mL/min (ref >90)
GFR calc non Af Amer: 82 mL/min — ABNORMAL LOW (ref >90)
Glucose, Bld: 101 mg/dL — ABNORMAL HIGH (ref 70–99)
Potassium: 5.4 mEq/L — ABNORMAL HIGH (ref 3.5–5.1)
Sodium: 134 mEq/L — ABNORMAL LOW (ref 135–145)

## 2011-12-28 LAB — GLUCOSE, CAPILLARY
Glucose-Capillary: 48 mg/dL — ABNORMAL LOW (ref 70–99)
Glucose-Capillary: 83 mg/dL (ref 70–99)
Glucose-Capillary: 94 mg/dL (ref 70–99)
Glucose-Capillary: 102 mg/dL — ABNORMAL HIGH (ref 70–99)
Glucose-Capillary: 158 mg/dL — ABNORMAL HIGH (ref 70–99)

## 2011-12-28 LAB — HEPARIN LEVEL (UNFRACTIONATED): Heparin Unfractionated: 0.32 IU/mL (ref 0.30–0.70)

## 2011-12-28 LAB — CBC
HCT: 44.3 % (ref 36.0–46.0)
HCT: 43 % (ref 36.0–46.0)
HCT: 36.4 % (ref 36.0–46.0)
Hemoglobin: 15.4 g/dL — ABNORMAL HIGH (ref 12.0–15.0)
Hemoglobin: 14.7 g/dL (ref 12.0–15.0)
Hemoglobin: 12.5 g/dL (ref 12.0–15.0)
MCH: 30.4 pg (ref 26.0–34.0)
MCH: 29.5 pg (ref 26.0–34.0)
MCH: 29.6 pg (ref 26.0–34.0)
MCHC: 34.8 g/dL (ref 30.0–36.0)
MCHC: 34.2 g/dL (ref 30.0–36.0)
MCHC: 34.3 g/dL (ref 30.0–36.0)
MCV: 87.4 fL (ref 78.0–100.0)
MCV: 86.3 fL (ref 78.0–100.0)
MCV: 86.1 fL (ref 78.0–100.0)
Platelets: 118 10*3/uL — ABNORMAL LOW (ref 150–400)
Platelets: 76 10*3/uL — ABNORMAL LOW (ref 150–400)
Platelets: 88 10*3/uL — ABNORMAL LOW (ref 150–400)
RBC: 5.07 MIL/uL (ref 3.87–5.11)
RBC: 4.98 MIL/uL (ref 3.87–5.11)
RBC: 4.23 MIL/uL (ref 3.87–5.11)
RDW: 14.6 % (ref 11.5–15.5)
RDW: 14.5 % (ref 11.5–15.5)
RDW: 14.6 % (ref 11.5–15.5)
WBC: 5.1 10*3/uL (ref 4.0–10.5)
WBC: 14 10*3/uL — ABNORMAL HIGH (ref 4.0–10.5)
WBC: 13.7 10*3/uL — ABNORMAL HIGH (ref 4.0–10.5)

## 2011-12-28 LAB — POCT I-STAT, CHEM 8
BUN: 8 mg/dL (ref 6–23)
BUN: 10 mg/dL (ref 6–23)
Calcium, Ion: 1.07 mmol/L — ABNORMAL LOW (ref 1.12–1.32)
Calcium, Ion: 1.12 mmol/L (ref 1.12–1.32)
Chloride: 106 mEq/L (ref 96–112)
Chloride: 106 mEq/L (ref 96–112)
Creatinine, Ser: 0.6 mg/dL (ref 0.50–1.10)
Creatinine, Ser: 0.6 mg/dL (ref 0.50–1.10)
Glucose, Bld: 124 mg/dL — ABNORMAL HIGH (ref 70–99)
Glucose, Bld: 156 mg/dL — ABNORMAL HIGH (ref 70–99)
HCT: 41 % (ref 36.0–46.0)
HCT: 34 % — ABNORMAL LOW (ref 36.0–46.0)
Hemoglobin: 13.9 g/dL (ref 12.0–15.0)
Hemoglobin: 11.6 g/dL — ABNORMAL LOW (ref 12.0–15.0)
Potassium: 3.2 mEq/L — ABNORMAL LOW (ref 3.5–5.1)
Potassium: 4.1 mEq/L (ref 3.5–5.1)
Sodium: 145 mEq/L (ref 135–145)
Sodium: 144 mEq/L (ref 135–145)
TCO2: 22 mmol/L (ref 0–100)
TCO2: 25 mmol/L (ref 0–100)

## 2011-12-28 LAB — POCT I-STAT GLUCOSE
Glucose, Bld: 131 mg/dL — ABNORMAL HIGH (ref 70–99)
Glucose, Bld: 122 mg/dL — ABNORMAL HIGH (ref 70–99)
Operator id: 3390
Operator id: 3390

## 2011-12-28 LAB — MAGNESIUM: Magnesium: 3.3 mg/dL — ABNORMAL HIGH (ref 1.5–2.5)

## 2011-12-28 LAB — HEMOGLOBIN AND HEMATOCRIT, BLOOD
HCT: 17.2 % — ABNORMAL LOW (ref 36.0–46.0)
Hemoglobin: 5.7 g/dL — CL (ref 12.0–15.0)

## 2011-12-28 LAB — APTT: aPTT: 45 seconds — ABNORMAL HIGH (ref 24–37)

## 2011-12-28 LAB — TYPE AND SCREEN
ABO/RH(D): O POS
Antibody Screen: NEGATIVE

## 2011-12-28 LAB — PLATELET COUNT: Platelets: 79 10*3/uL — ABNORMAL LOW (ref 150–400)

## 2011-12-28 LAB — PROTIME-INR
INR: 1.83 — ABNORMAL HIGH (ref 0.00–1.49)
Prothrombin Time: 21.5 seconds — ABNORMAL HIGH (ref 11.6–15.2)

## 2011-12-28 SURGERY — REPLACEMENT, AORTIC VALVE, OPEN
Anesthesia: General | Site: Chest

## 2011-12-28 MED ORDER — ACETAMINOPHEN 160 MG/5ML PO SOLN
975.0000 mg | Freq: Four times a day (QID) | ORAL | Status: DC
  Filled 2011-12-28: qty 40.6

## 2011-12-28 MED ORDER — POTASSIUM CHLORIDE 10 MEQ/50ML IV SOLN
10.0000 meq | INTRAVENOUS | Status: AC
  Administered 2011-12-28 (×2): 10 meq via INTRAVENOUS

## 2011-12-28 MED ORDER — VANCOMYCIN HCL 1000 MG IV SOLR
1000.0000 mg | Freq: Once | INTRAVENOUS | Status: AC
  Administered 2011-12-28: 1000 mg via INTRAVENOUS
  Filled 2011-12-28: qty 1000

## 2011-12-28 MED ORDER — FENTANYL CITRATE 0.05 MG/ML IJ SOLN
INTRAMUSCULAR | Status: DC | PRN
  Administered 2011-12-28: 100 ug via INTRAVENOUS
  Administered 2011-12-28: 250 ug via INTRAVENOUS
  Administered 2011-12-28: 150 ug via INTRAVENOUS
  Administered 2011-12-28: 50 ug via INTRAVENOUS
  Administered 2011-12-28: 100 ug via INTRAVENOUS
  Administered 2011-12-28 (×2): 150 ug via INTRAVENOUS
  Administered 2011-12-28: 250 ug via INTRAVENOUS
  Administered 2011-12-28: 50 ug via INTRAVENOUS

## 2011-12-28 MED ORDER — SODIUM CHLORIDE 0.9 % IV SOLN
200.0000 ug | INTRAVENOUS | Status: DC | PRN
  Administered 2011-12-28: 0.2 ug/kg/h via INTRAVENOUS

## 2011-12-28 MED ORDER — LACTATED RINGERS IV SOLN: 500.0000 mL | Freq: Once | INTRAVENOUS | Status: AC | PRN

## 2011-12-28 MED ORDER — ALBUMIN HUMAN 5 % IV SOLN
250.0000 mL | INTRAVENOUS | Status: AC | PRN
  Administered 2011-12-28 – 2011-12-29 (×4): 250 mL via INTRAVENOUS
  Filled 2011-12-28 (×2): qty 250

## 2011-12-28 MED ORDER — VANCOMYCIN HCL 1000 MG IV SOLR
1000.0000 mg | INTRAVENOUS | Status: DC | PRN
  Administered 2011-12-28: 1.25 mg via INTRAVENOUS

## 2011-12-28 MED ORDER — ASPIRIN EC 325 MG PO TBEC
325.0000 mg | DELAYED_RELEASE_TABLET | Freq: Every day | ORAL | Status: DC
  Administered 2011-12-29 – 2011-12-31 (×3): 325 mg via ORAL
  Filled 2011-12-28 (×4): qty 1

## 2011-12-28 MED ORDER — METOPROLOL TARTRATE 12.5 MG HALF TABLET
12.5000 mg | ORAL_TABLET | Freq: Two times a day (BID) | ORAL | Status: DC
  Filled 2011-12-28 (×3): qty 1

## 2011-12-28 MED ORDER — CALCIUM CHLORIDE 10 % IV SOLN
INTRAVENOUS | Status: DC | PRN
  Administered 2011-12-28 (×2): 0.5 g via INTRAVENOUS

## 2011-12-28 MED ORDER — ROCURONIUM BROMIDE 100 MG/10ML IV SOLN
INTRAVENOUS | Status: DC | PRN
  Administered 2011-12-28 (×2): 50 mg via INTRAVENOUS

## 2011-12-28 MED ORDER — DEXTROSE 50 % IV SOLN
INTRAVENOUS | Status: AC
  Administered 2011-12-28: 21 mL via INTRAVENOUS
  Filled 2011-12-28: qty 50

## 2011-12-28 MED ORDER — 0.9 % SODIUM CHLORIDE (POUR BTL) OPTIME
TOPICAL | Status: DC | PRN
  Administered 2011-12-28: 6000 mL

## 2011-12-28 MED ORDER — SODIUM CHLORIDE 0.45 % IV SOLN: INTRAVENOUS | Status: DC

## 2011-12-28 MED ORDER — DEXTROSE 5 % IV SOLN
1.5000 g | Freq: Two times a day (BID) | INTRAVENOUS | Status: AC
  Administered 2011-12-28 – 2011-12-30 (×4): 1.5 g via INTRAVENOUS
  Filled 2011-12-28 (×4): qty 1.5

## 2011-12-28 MED ORDER — SODIUM CHLORIDE 0.9 % IV SOLN
0.1000 ug/kg/h | INTRAVENOUS | Status: DC
  Administered 2011-12-28: 0.5 ug/kg/h via INTRAVENOUS
  Filled 2011-12-28: qty 2

## 2011-12-28 MED ORDER — SODIUM CHLORIDE 0.9 % IJ SOLN
3.0000 mL | Freq: Two times a day (BID) | INTRAMUSCULAR | Status: DC
  Administered 2011-12-29 – 2011-12-31 (×5): 3 mL via INTRAVENOUS

## 2011-12-28 MED ORDER — OXYCODONE HCL 5 MG PO TABS
5.0000 mg | ORAL_TABLET | ORAL | Status: DC | PRN
  Administered 2011-12-29 – 2012-01-01 (×9): 5 mg via ORAL
  Filled 2011-12-28 (×9): qty 1

## 2011-12-28 MED ORDER — MORPHINE SULFATE 2 MG/ML IJ SOLN
1.0000 mg | INTRAMUSCULAR | Status: AC | PRN
  Administered 2011-12-28: 2 mg via INTRAVENOUS
  Filled 2011-12-28: qty 1

## 2011-12-28 MED ORDER — MAGNESIUM SULFATE 40 MG/ML IJ SOLN
4.0000 g | Freq: Once | INTRAMUSCULAR | Status: AC
  Administered 2011-12-28: 4 g via INTRAVENOUS
  Filled 2011-12-28: qty 100

## 2011-12-28 MED ORDER — SODIUM CHLORIDE 0.9 % IV SOLN
100.0000 [IU] | INTRAVENOUS | Status: DC | PRN
  Administered 2011-12-28: 2.7 [IU]/h via INTRAVENOUS

## 2011-12-28 MED ORDER — NITROGLYCERIN IN D5W 200-5 MCG/ML-% IV SOLN
INTRAVENOUS | Status: DC | PRN
  Administered 2011-12-28: 5 ug/min via INTRAVENOUS

## 2011-12-28 MED ORDER — SODIUM BICARBONATE 4.2 % IV SOLN
INTRAVENOUS | Status: DC | PRN
  Administered 2011-12-28: 50 meq via INTRAVENOUS

## 2011-12-28 MED ORDER — BISACODYL 5 MG PO TBEC
10.0000 mg | DELAYED_RELEASE_TABLET | Freq: Every day | ORAL | Status: DC
  Administered 2011-12-29 – 2011-12-31 (×3): 10 mg via ORAL
  Filled 2011-12-28 (×3): qty 2

## 2011-12-28 MED ORDER — HEMOSTATIC AGENTS (NO CHARGE) OPTIME
TOPICAL | Status: DC | PRN
  Administered 2011-12-28: 1 via TOPICAL

## 2011-12-28 MED ORDER — DEXTROSE 5 % IV SOLN
1.5000 g | INTRAVENOUS | Status: DC | PRN
  Administered 2011-12-28: 1.5 g via INTRAVENOUS

## 2011-12-28 MED ORDER — BISACODYL 10 MG RE SUPP: 10.0000 mg | Freq: Every day | RECTAL | Status: DC

## 2011-12-28 MED ORDER — MORPHINE SULFATE 4 MG/ML IJ SOLN: 2.0000 mg | INTRAMUSCULAR | Status: DC | PRN

## 2011-12-28 MED ORDER — EPINEPHRINE HCL 0.1 MG/ML IJ SOLN
INTRAMUSCULAR | Status: DC | PRN
  Administered 2011-12-28: .8 mg via INTRAVENOUS

## 2011-12-28 MED ORDER — HEMOSTATIC AGENTS (NO CHARGE) OPTIME
TOPICAL | Status: DC | PRN
  Administered 2011-12-28: 3 via TOPICAL

## 2011-12-28 MED ORDER — SODIUM CHLORIDE 0.9 % IV SOLN
INTRAVENOUS | Status: DC | PRN
  Administered 2011-12-28: 14:00:00 via INTRAVENOUS

## 2011-12-28 MED ORDER — PHENYLEPHRINE HCL 10 MG/ML IJ SOLN
10.0000 mg | INTRAVENOUS | Status: DC | PRN
  Administered 2011-12-28: 30 ug/min via INTRAVENOUS

## 2011-12-28 MED ORDER — DOPAMINE-DEXTROSE 3.2-5 MG/ML-% IV SOLN
INTRAVENOUS | Status: DC | PRN
  Administered 2011-12-28: 3 ug/kg/min via INTRAVENOUS

## 2011-12-28 MED ORDER — DOPAMINE-DEXTROSE 3.2-5 MG/ML-% IV SOLN
3.0000 ug/kg/min | INTRAVENOUS | Status: DC
  Administered 2011-12-28: 3 ug/kg/min via INTRAVENOUS

## 2011-12-28 MED ORDER — DOCUSATE SODIUM 100 MG PO CAPS
200.0000 mg | ORAL_CAPSULE | Freq: Every day | ORAL | Status: DC
  Administered 2011-12-29 – 2011-12-30 (×2): 200 mg via ORAL
  Administered 2011-12-31 (×2): 100 mg via ORAL
  Filled 2011-12-28 (×2): qty 2
  Filled 2011-12-28: qty 1

## 2011-12-28 MED ORDER — ASPIRIN 81 MG PO CHEW: 324.0000 mg | CHEWABLE_TABLET | Freq: Every day | ORAL | Status: DC

## 2011-12-28 MED ORDER — ACETAMINOPHEN 160 MG/5ML PO SOLN: 650.0000 mg | ORAL | Status: AC

## 2011-12-28 MED ORDER — VECURONIUM BROMIDE 10 MG IV SOLR
INTRAVENOUS | Status: DC | PRN
  Administered 2011-12-28: 4 mg via INTRAVENOUS
  Administered 2011-12-28: 5 mg via INTRAVENOUS
  Administered 2011-12-28: 3 mg via INTRAVENOUS

## 2011-12-28 MED ORDER — PROTAMINE SULFATE 10 MG/ML IV SOLN
INTRAVENOUS | Status: DC | PRN
  Administered 2011-12-28: 100 mg via INTRAVENOUS
  Administered 2011-12-28: 200 mg via INTRAVENOUS

## 2011-12-28 MED ORDER — ACETAMINOPHEN 650 MG RE SUPP
650.0000 mg | RECTAL | Status: AC
  Administered 2011-12-28: 650 mg via RECTAL

## 2011-12-28 MED ORDER — SODIUM CHLORIDE 0.9 % IV SOLN: 250.0000 mL | INTRAVENOUS | Status: DC

## 2011-12-28 MED ORDER — SODIUM CHLORIDE 0.9 % IV SOLN
INTRAVENOUS | Status: DC
  Filled 2011-12-28: qty 1

## 2011-12-28 MED ORDER — POTASSIUM CHLORIDE 10 MEQ/50ML IV SOLN
10.0000 meq | INTRAVENOUS | Status: AC
  Administered 2011-12-28 (×3): 10 meq via INTRAVENOUS

## 2011-12-28 MED ORDER — ONDANSETRON HCL 4 MG/2ML IJ SOLN: 4.0000 mg | Freq: Four times a day (QID) | INTRAMUSCULAR | Status: DC | PRN

## 2011-12-28 MED ORDER — MIDAZOLAM HCL 5 MG/5ML IJ SOLN
INTRAMUSCULAR | Status: DC | PRN
  Administered 2011-12-28 (×2): 4 mg via INTRAVENOUS
  Administered 2011-12-28: 1 mg via INTRAVENOUS
  Administered 2011-12-28: 5 mg via INTRAVENOUS

## 2011-12-28 MED ORDER — EPINEPHRINE HCL 1 MG/ML IJ SOLN
1000.0000 ug | INTRAVENOUS | Status: DC | PRN
  Administered 2011-12-28: .3 ug/min via INTRAVENOUS

## 2011-12-28 MED ORDER — LACTATED RINGERS IV SOLN
INTRAVENOUS | Status: DC
  Administered 2011-12-28: 19:00:00 via INTRAVENOUS

## 2011-12-28 MED ORDER — SODIUM CHLORIDE 0.9 % IV SOLN
10.0000 g | INTRAVENOUS | Status: DC | PRN
  Administered 2011-12-28: 5 g/h via INTRAVENOUS

## 2011-12-28 MED ORDER — METOPROLOL TARTRATE 25 MG/10 ML ORAL SUSPENSION
12.5000 mg | Freq: Two times a day (BID) | ORAL | Status: DC
  Filled 2011-12-28 (×3): qty 5

## 2011-12-28 MED ORDER — INSULIN ASPART 100 UNIT/ML ~~LOC~~ SOLN
0.0000 [IU] | SUBCUTANEOUS | Status: DC
  Administered 2011-12-29 (×2): 2 [IU] via SUBCUTANEOUS

## 2011-12-28 MED ORDER — EPHEDRINE SULFATE 50 MG/ML IJ SOLN
INTRAMUSCULAR | Status: DC | PRN
  Administered 2011-12-28 (×2): 10 mg via INTRAVENOUS

## 2011-12-28 MED ORDER — PANTOPRAZOLE SODIUM 40 MG PO TBEC
40.0000 mg | DELAYED_RELEASE_TABLET | Freq: Every day | ORAL | Status: DC
  Administered 2011-12-30 – 2011-12-31 (×2): 40 mg via ORAL
  Filled 2011-12-28 (×2): qty 1

## 2011-12-28 MED ORDER — ETOMIDATE 2 MG/ML IV SOLN
INTRAVENOUS | Status: DC | PRN
  Administered 2011-12-28: 9 mg via INTRAVENOUS

## 2011-12-28 MED ORDER — PAPAVERINE HCL 30 MG/ML IJ SOLN
INTRAMUSCULAR | Status: DC | PRN
  Administered 2011-12-28: 60 mg via INTRAVENOUS

## 2011-12-28 MED ORDER — LACTATED RINGERS IV SOLN
INTRAVENOUS | Status: DC | PRN
  Administered 2011-12-28: 06:00:00 via INTRAVENOUS

## 2011-12-28 MED ORDER — SODIUM CHLORIDE 0.9 % IV SOLN: INTRAVENOUS | Status: DC

## 2011-12-28 MED ORDER — PHENYLEPHRINE HCL 10 MG/ML IJ SOLN
0.0000 ug/min | INTRAVENOUS | Status: DC
  Administered 2011-12-29: 15 ug/min via INTRAVENOUS
  Administered 2011-12-29: 40 ug/min via INTRAVENOUS
  Administered 2011-12-29: 50 ug/min via INTRAVENOUS
  Administered 2011-12-30: 45 ug/min via INTRAVENOUS
  Filled 2011-12-28 (×4): qty 2

## 2011-12-28 MED ORDER — MIDAZOLAM HCL 2 MG/2ML IJ SOLN: 2.0000 mg | INTRAMUSCULAR | Status: DC | PRN

## 2011-12-28 MED ORDER — ACETAMINOPHEN 500 MG PO TABS
1000.0000 mg | ORAL_TABLET | Freq: Four times a day (QID) | ORAL | Status: DC
  Administered 2011-12-28 – 2011-12-29 (×2): 1000 mg via ORAL
  Administered 2011-12-29: 500 mg via ORAL
  Administered 2011-12-29 – 2011-12-31 (×7): 1000 mg via ORAL
  Administered 2011-12-31: 500 mg via ORAL
  Administered 2012-01-01: 1000 mg via ORAL
  Filled 2011-12-28 (×18): qty 2

## 2011-12-28 MED ORDER — LACTATED RINGERS IV SOLN
INTRAVENOUS | Status: DC | PRN
  Administered 2011-12-28 (×2): via INTRAVENOUS

## 2011-12-28 MED ORDER — HEPARIN SODIUM (PORCINE) 1000 UNIT/ML IJ SOLN
INTRAMUSCULAR | Status: DC | PRN
  Administered 2011-12-28: 18000 [IU] via INTRAVENOUS
  Administered 2011-12-28: 19000 [IU] via INTRAVENOUS

## 2011-12-28 MED ORDER — METOPROLOL TARTRATE 1 MG/ML IV SOLN
2.5000 mg | INTRAVENOUS | Status: DC | PRN
  Administered 2011-12-30: 2.5 mg via INTRAVENOUS
  Filled 2011-12-28: qty 5

## 2011-12-28 MED ORDER — MILRINONE IN DEXTROSE 200-5 MCG/ML-% IV SOLN
0.3750 ug/kg/min | INTRAVENOUS | Status: DC
  Administered 2011-12-29: 0.375 ug/kg/min via INTRAVENOUS
  Filled 2011-12-28 (×3): qty 100

## 2011-12-28 MED ORDER — LACTATED RINGERS IV SOLN
INTRAVENOUS | Status: DC | PRN
  Administered 2011-12-28 (×2): via INTRAVENOUS

## 2011-12-28 MED ORDER — SODIUM CHLORIDE 0.9 % IJ SOLN: 3.0000 mL | INTRAMUSCULAR | Status: DC | PRN

## 2011-12-28 MED ORDER — ALBUMIN HUMAN 5 % IV SOLN
INTRAVENOUS | Status: DC | PRN
  Administered 2011-12-28: 15:00:00 via INTRAVENOUS

## 2011-12-28 MED ORDER — FAMOTIDINE IN NACL 20-0.9 MG/50ML-% IV SOLN
20.0000 mg | Freq: Two times a day (BID) | INTRAVENOUS | Status: DC
  Administered 2011-12-28: 20 mg via INTRAVENOUS

## 2011-12-28 MED ORDER — NITROGLYCERIN IN D5W 200-5 MCG/ML-% IV SOLN: 0.0000 ug/min | INTRAVENOUS | Status: DC

## 2011-12-28 MED ORDER — SODIUM CHLORIDE 0.9 % IV SOLN
5.0000 g | INTRAVENOUS | Status: DC
  Filled 2011-12-28: qty 20

## 2011-12-28 MED ORDER — INSULIN ASPART 100 UNIT/ML ~~LOC~~ SOLN
0.0000 [IU] | SUBCUTANEOUS | Status: DC
  Administered 2011-12-28 – 2011-12-29 (×3): 2 [IU] via SUBCUTANEOUS
  Filled 2011-12-28: qty 3

## 2011-12-28 MED FILL — Magnesium Sulfate Inj 50%: INTRAMUSCULAR | Qty: 10 | Status: AC

## 2011-12-28 MED FILL — Potassium Chloride Inj 2 mEq/ML: INTRAVENOUS | Qty: 40 | Status: AC

## 2011-12-28 SURGICAL SUPPLY — 155 items
ADAPTER CARDIO PERF ANTE/RETRO (ADAPTER) ×2 IMPLANT
APPLICATOR COTTON TIP 6IN STRL (MISCELLANEOUS) IMPLANT
ATTRACTOMAT 16X20 MAGNETIC DRP (DRAPES) ×2 IMPLANT
BAG DECANTER FOR FLEXI CONT (MISCELLANEOUS) ×2 IMPLANT
BANDAGE ELASTIC 4 VELCRO ST LF (GAUZE/BANDAGES/DRESSINGS) ×4 IMPLANT
BANDAGE ELASTIC 6 VELCRO ST LF (GAUZE/BANDAGES/DRESSINGS) ×4 IMPLANT
BANDAGE GAUZE ELAST BULKY 4 IN (GAUZE/BANDAGES/DRESSINGS) ×4 IMPLANT
BLADE SAW STERNAL (BLADE) ×2 IMPLANT
BLADE SURG 11 STRL SS (BLADE) IMPLANT
BLADE SURG 15 STRL LF DISP TIS (BLADE) ×2 IMPLANT
BLADE SURG 15 STRL SS (BLADE) ×2
BLADE SURG ROTATE 9660 (MISCELLANEOUS) IMPLANT
BOOT SUTURE AID YELLOW STND (SUTURE) IMPLANT
CANISTER SUCTION 2500CC (MISCELLANEOUS) ×2 IMPLANT
CANN PRFSN .5XCNCT 15X34-48 (MISCELLANEOUS) ×1
CANNULA ANTEGRADE CPL6 STR 12 (CANNULA) ×2 IMPLANT
CANNULA GUNDRY RCSP 15FR (MISCELLANEOUS) ×2 IMPLANT
CANNULA PRFSN .5XCNCT 15X34-48 (MISCELLANEOUS) ×1 IMPLANT
CANNULA VEN 2 STAGE (MISCELLANEOUS) ×3 IMPLANT
CANNULA VENOUS DUAL 32/40 (CANNULA) IMPLANT
CANNULA VESSEL W/WING WO/VALVE (CANNULA) IMPLANT
CATH CPB KIT GERHARDT (MISCELLANEOUS) ×2 IMPLANT
CATH HEART VENT LEFT (CATHETERS) ×1 IMPLANT
CATH RETROPLEGIA CORONARY 14FR (CATHETERS) ×2 IMPLANT
CATH ROBINSON RED A/P 18FR (CATHETERS) IMPLANT
CATH THORACIC 28FR (CATHETERS) ×2 IMPLANT
CATH THORACIC 28FR RT ANG (CATHETERS) IMPLANT
CATH THORACIC 36FR (CATHETERS) IMPLANT
CATH THORACIC 36FR RT ANG (CATHETERS) IMPLANT
CATH/SQUID NICHOLS JEHLE COR (CATHETERS) IMPLANT
CLIP FOGARTY SPRING 6M (CLIP) IMPLANT
CLIP TI MEDIUM 24 (CLIP) IMPLANT
CLIP TI WIDE RED SMALL 24 (CLIP) IMPLANT
CLOTH BEACON ORANGE TIMEOUT ST (SAFETY) ×2 IMPLANT
COVER SURGICAL LIGHT HANDLE (MISCELLANEOUS) ×4 IMPLANT
CRADLE DONUT ADULT HEAD (MISCELLANEOUS) ×2 IMPLANT
DRAIN CHANNEL 28F RND 3/8 FF (WOUND CARE) ×2 IMPLANT
DRAIN CHANNEL 32F RND 10.7 FF (WOUND CARE) ×2 IMPLANT
DRAPE CARDIOVASCULAR INCISE (DRAPES) ×1
DRAPE SLUSH MACHINE 52X66 (DRAPES) ×2 IMPLANT
DRAPE SLUSH/WARMER DISC (DRAPES) IMPLANT
DRAPE SRG 135X102X78XABS (DRAPES) ×1 IMPLANT
DRSG COVADERM 4X14 (GAUZE/BANDAGES/DRESSINGS) ×2 IMPLANT
ELECT BLADE 4.0 EZ CLEAN MEGAD (MISCELLANEOUS) ×2
ELECT CAUTERY BLADE 6.4 (BLADE) ×2 IMPLANT
ELECT REM PT RETURN 9FT ADLT (ELECTROSURGICAL) ×4
ELECTRODE BLDE 4.0 EZ CLN MEGD (MISCELLANEOUS) ×1 IMPLANT
ELECTRODE REM PT RTRN 9FT ADLT (ELECTROSURGICAL) ×2 IMPLANT
GAUZE KERLIX 2  STERILE LF (GAUZE/BANDAGES/DRESSINGS) ×2 IMPLANT
GLOVE BIO SURGEON STRL SZ 6 (GLOVE) ×4 IMPLANT
GLOVE BIO SURGEON STRL SZ 6.5 (GLOVE) ×14 IMPLANT
GLOVE BIO SURGEON STRL SZ7 (GLOVE) ×4 IMPLANT
GLOVE BIO SURGEON STRL SZ7.5 (GLOVE) IMPLANT
GLOVE BIOGEL PI IND STRL 6 (GLOVE) ×3 IMPLANT
GLOVE BIOGEL PI IND STRL 6.5 (GLOVE) IMPLANT
GLOVE BIOGEL PI IND STRL 7.0 (GLOVE) IMPLANT
GLOVE BIOGEL PI INDICATOR 6 (GLOVE) ×3
GLOVE BIOGEL PI INDICATOR 6.5 (GLOVE)
GLOVE BIOGEL PI INDICATOR 7.0 (GLOVE)
GLOVE EUDERMIC 7 POWDERFREE (GLOVE) IMPLANT
GLOVE ORTHO TXT STRL SZ7.5 (GLOVE) IMPLANT
GOWN STRL NON-REIN LRG LVL3 (GOWN DISPOSABLE) ×18 IMPLANT
HANDLE STAPLE ENDO GIA SHORT (STAPLE) ×1
HEMOSTAT POWDER SURGIFOAM 1G (HEMOSTASIS) ×6 IMPLANT
HEMOSTAT SURGICEL 2X14 (HEMOSTASIS) ×2 IMPLANT
INSERT FOGARTY 61MM (MISCELLANEOUS) IMPLANT
INSERT FOGARTY XLG (MISCELLANEOUS) IMPLANT
KIT BASIN OR (CUSTOM PROCEDURE TRAY) ×2 IMPLANT
KIT PAIN CUSTOM (MISCELLANEOUS) IMPLANT
KIT ROOM TURNOVER OR (KITS) ×2 IMPLANT
KIT SUCTION CATH 14FR (SUCTIONS) ×4 IMPLANT
KIT VASOVIEW W/TROCAR VH 2000 (KITS) ×2 IMPLANT
LEAD PACING MYOCARDI (MISCELLANEOUS) ×2 IMPLANT
LINE VENT (MISCELLANEOUS) ×2 IMPLANT
MARKER GRAFT CORONARY BYPASS (MISCELLANEOUS) ×6 IMPLANT
NEEDLE 18GX1X1/2 (RX/OR ONLY) (NEEDLE) ×2 IMPLANT
NS IRRIG 1000ML POUR BTL (IV SOLUTION) ×16 IMPLANT
PACK OPEN HEART (CUSTOM PROCEDURE TRAY) ×2 IMPLANT
PAD ARMBOARD 7.5X6 YLW CONV (MISCELLANEOUS) ×4 IMPLANT
PENCIL BUTTON HOLSTER BLD 10FT (ELECTRODE) ×2 IMPLANT
PUNCH AORTIC ROTATE 4.0MM (MISCELLANEOUS) ×2 IMPLANT
PUNCH AORTIC ROTATE 4.5MM 8IN (MISCELLANEOUS) IMPLANT
PUNCH AORTIC ROTATE 5MM 8IN (MISCELLANEOUS) IMPLANT
RELOAD EGIA 45 MED/THCK PURPLE (STAPLE) ×6 IMPLANT
SOLUTION ANTI FOG 6CC (MISCELLANEOUS) IMPLANT
SPONGE GAUZE 4X4 12PLY (GAUZE/BANDAGES/DRESSINGS) ×4 IMPLANT
SPONGE INTESTINAL PEANUT (DISPOSABLE) IMPLANT
SPONGE LAP 18X18 X RAY DECT (DISPOSABLE) ×8 IMPLANT
SPONGE LAP 4X18 X RAY DECT (DISPOSABLE) IMPLANT
STAPLER ENDO GIA 12MM SHORT (STAPLE) ×1 IMPLANT
STAPLER VISISTAT 35W (STAPLE) ×4 IMPLANT
STOPCOCK 4 WAY LG BORE MALE ST (IV SETS) IMPLANT
SUT BONE WAX W31G (SUTURE) IMPLANT
SUT ETHIBON 2 0 V 52N 30 (SUTURE) ×4 IMPLANT
SUT ETHIBON EXCEL 2-0 V-5 (SUTURE) IMPLANT
SUT ETHIBOND 2 0 SH (SUTURE) ×2
SUT ETHIBOND 2 0 SH 36X2 (SUTURE) ×2 IMPLANT
SUT ETHIBOND 2 0 V4 (SUTURE) IMPLANT
SUT ETHIBOND 2 0V4 GREEN (SUTURE) IMPLANT
SUT ETHIBOND V-5 VALVE (SUTURE) IMPLANT
SUT MNCRL AB 4-0 PS2 18 (SUTURE) IMPLANT
SUT PROLENE 3 0 RB 1 (SUTURE) ×2 IMPLANT
SUT PROLENE 3 0 SH 1 (SUTURE) ×2 IMPLANT
SUT PROLENE 3 0 SH DA (SUTURE) IMPLANT
SUT PROLENE 3 0 SH1 36 (SUTURE) ×6 IMPLANT
SUT PROLENE 4 0 RB 1 (SUTURE) ×3
SUT PROLENE 4 0 SH DA (SUTURE) ×2 IMPLANT
SUT PROLENE 4 0 TF (SUTURE) ×4 IMPLANT
SUT PROLENE 4-0 RB1 .5 CRCL 36 (SUTURE) ×3 IMPLANT
SUT PROLENE 5 0 C 1 36 (SUTURE) ×4 IMPLANT
SUT PROLENE 6 0 C 1 30 (SUTURE) IMPLANT
SUT PROLENE 6 0 CC (SUTURE) ×8 IMPLANT
SUT PROLENE 7 0 BV 1 (SUTURE) ×4 IMPLANT
SUT PROLENE 7 0 BV1 MDA (SUTURE) ×4 IMPLANT
SUT PROLENE 7.0 RB 3 (SUTURE) IMPLANT
SUT PROLENE 8 0 BV175 6 (SUTURE) ×6 IMPLANT
SUT SILK  1 MH (SUTURE) ×2
SUT SILK 1 MH (SUTURE) ×2 IMPLANT
SUT SILK 1 TIES 10X30 (SUTURE) ×2 IMPLANT
SUT SILK 2 0 (SUTURE) ×1
SUT SILK 2 0 SH CR/8 (SUTURE) ×4 IMPLANT
SUT SILK 2 0 TIES 10X30 (SUTURE) ×2 IMPLANT
SUT SILK 2-0 18XBRD TIE 12 (SUTURE) ×1 IMPLANT
SUT SILK 3 0 SH CR/8 (SUTURE) ×2 IMPLANT
SUT SILK 4 0 (SUTURE) ×1
SUT SILK 4 0 TIE 10X30 (SUTURE) ×2 IMPLANT
SUT SILK 4-0 18XBRD TIE 12 (SUTURE) ×1 IMPLANT
SUT STEEL 6MS V (SUTURE) IMPLANT
SUT STEEL STERNAL CCS#1 18IN (SUTURE) ×4 IMPLANT
SUT STEEL SZ 6 DBL 3X14 BALL (SUTURE) ×2 IMPLANT
SUT TEM PAC WIRE 2 0 SH (SUTURE) ×4 IMPLANT
SUT VIC AB 1 CTX 18 (SUTURE) ×4 IMPLANT
SUT VIC AB 1 CTX 36 (SUTURE)
SUT VIC AB 1 CTX36XBRD ANBCTR (SUTURE) IMPLANT
SUT VIC AB 2-0 CT1 27 (SUTURE) ×2
SUT VIC AB 2-0 CT1 TAPERPNT 27 (SUTURE) ×2 IMPLANT
SUT VIC AB 2-0 CTX 27 (SUTURE) IMPLANT
SUT VIC AB 3-0 SH 27 (SUTURE)
SUT VIC AB 3-0 SH 27X BRD (SUTURE) IMPLANT
SUT VIC AB 3-0 X1 27 (SUTURE) IMPLANT
SUT VICRYL 4-0 PS2 18IN ABS (SUTURE) IMPLANT
SUTURE E-PAK OPEN HEART (SUTURE) ×2 IMPLANT
SYSTEM SAHARA CHEST DRAIN ATS (WOUND CARE) ×2 IMPLANT
TAPES RETRACTO (MISCELLANEOUS) ×2 IMPLANT
TOWEL OR 17X24 6PK STRL BLUE (TOWEL DISPOSABLE) ×4 IMPLANT
TOWEL OR 17X26 10 PK STRL BLUE (TOWEL DISPOSABLE) ×6 IMPLANT
TRAY FOLEY IC TEMP SENS 14FR (CATHETERS) ×2 IMPLANT
TRAY FOLEY IC TEMP SENS 16FR (CATHETERS) ×2 IMPLANT
TUBE FEEDING 8FR 16IN STR KANG (MISCELLANEOUS) ×2 IMPLANT
TUBE SUCT INTRACARD DLP 20F (MISCELLANEOUS) ×2 IMPLANT
TUBING INSUFFLATION 10FT LAP (TUBING) ×2 IMPLANT
UNDERPAD 30X30 INCONTINENT (UNDERPADS AND DIAPERS) ×2 IMPLANT
VALVE MAGNA EASE AORTIC 19MM (Prosthesis & Implant Heart) ×2 IMPLANT
VENT LEFT HEART 12002 (CATHETERS) ×2
WATER STERILE IRR 1000ML POUR (IV SOLUTION) ×4 IMPLANT

## 2011-12-28 NOTE — Progress Notes (Signed)
Nursing note: CBG at 1730 48, insulin gtt off and 21ml of 50% dextrose given IV.  Repeat CBG at 1800 =83. Dr. Renato Battles aware and said to hold insulin gtt for one hour and repeat CBG at 1900hrs.

## 2011-12-28 NOTE — Progress Notes (Signed)
Patient ID: Carol [name], female   DOB: 1928-04-21, 78 y.o.   MRN: [id number]  Filed Vitals:   12/28/11 1600 12/28/11 1615 12/28/11 1630 12/28/11 1645  BP:      Pulse: 123 60 56 56  Temp: 97.5 F (36.4 C) 97.3 F (36.3 C) 96.8 F (36 C) 97.3 F (36.3 C)  TempSrc: Core (Comment) Core (Comment) Core (Comment) Core (Comment)  Resp: 0 10 0 12  Height:      Weight:      SpO2: 100% 100% 100% 100%   CI=1.8 on dop 3, milrinone 0.2  Urine output ok CT output ok  Still asleep on vent  CBC    Component Value Date/Time   WBC 14.0* 12/28/2011 1634   WBC 8.8 09/12/2011 1248   RBC 4.98 12/28/2011 1634   RBC 4.85 09/12/2011 1248   HGB 14.7 12/28/2011 1634   HGB 13.1 09/12/2011 1248   HCT 43.0 12/28/2011 1634   HCT 40.0 09/12/2011 1248   PLT 76* 12/28/2011 1634   PLT 171 09/12/2011 1248   MCV 86.3 12/28/2011 1634   MCV 82.5 09/12/2011 1248   MCH 29.5 12/28/2011 1634   MCH 27.0 09/12/2011 1248   MCHC 34.2 12/28/2011 1634   MCHC 32.8 09/12/2011 1248   RDW 14.5 12/28/2011 1634   RDW 16.4* 09/12/2011 1248   LYMPHSABS 1.6 09/12/2011 1248   LYMPHSABS 1.2 01/21/2011 0500   MONOABS 0.8 09/12/2011 1248   MONOABS 0.9 01/21/2011 0500   EOSABS 0.1 09/12/2011 1248   EOSABS 0.2 01/21/2011 0500   BASOSABS 0.0 09/12/2011 1248   BASOSABS 0.0 01/21/2011 0500    BMET    Component Value Date/Time   NA 145 12/28/2011 1616   K 3.2* 12/28/2011 1616   CL 106 12/28/2011 1616   CO2 22 12/28/2011 0500   GLUCOSE 124* 12/28/2011 1616   BUN 8 12/28/2011 1616   CREATININE 0.60 12/28/2011 1616   CALCIUM 9.9 12/28/2011 0500   GFRNONAA 82* 12/28/2011 0500   GFRAA >90 12/28/2011 0500    A/P: stable postop course.

## 2011-12-28 NOTE — Anesthesia Procedure Notes (Signed)
Procedures0745: TEE placed without difficulty. Heavily lubricated in a sleeve.

## 2011-12-28 NOTE — OR Nursing (Signed)
09:49am Specimen received in pathology for frozen

## 2011-12-28 NOTE — Brief Op Note (Signed)
12/26/2011 - 12/28/2011  12:39 PM  PATIENT:  Carol [name]  78 y.o. female  PRE-OPERATIVE DIAGNOSIS:  AS, CAD, LUL lung lesion  POST-OPERATIVE DIAGNOSIS:  AS, CAD, LUL lung lesion  PROCEDURE:  Procedure(s):  AORTIC VALVE REPLACEMENT (AVR) (19 mm Magna Ease pericardial tissue valve)  CORONARY ARTERY BYPASS GRAFTING (CABG) x 3 (LIMA-LAD, SVG-Int, SVG-dRCA), open SV harvest left leg  LUL wedge resection  SURGEON:  Surgeon(s): Delight Ovens, MD  PHYSICIAN ASSISTANT: Coral Ceo, PA-C  ANESTHESIA:   general  PATIENT CONDITION:  ICU - intubated and hemodynamically stable.  PRE-OPERATIVE WEIGHT: 67 kg

## 2011-12-28 NOTE — Progress Notes (Signed)
CRITICAL VALUE ALERT  Critical value received:  Glucose 48  Date of notification:  12/28/2011  Time of notification:  1740  Critical value read back:yes  Nurse who received alert:  Mosie Epstein  MD notified (1st page):  Bartle  Time of first page:  1745  MD notified (2nd page):  Time of second page:  Responding MD:  Laneta Simmers  Time MD responded:  1740

## 2011-12-28 NOTE — Anesthesia Preprocedure Evaluation (Addendum)
Anesthesia Evaluation  Patient identified by MRN, date of birth, ID band Patient awake    Reviewed: Allergy & Precautions, H&P , NPO status , Patient's Chart, lab work & pertinent test results, reviewed documented beta blocker date and time   Airway Mallampati: II TM Distance: >3 FB Neck ROM: full    Dental  (+) Dental Advidsory Given   Pulmonary shortness of breath and with exertion,  clear to auscultation  Pulmonary exam normal       Cardiovascular hypertension, On Medications + CAD Regular Normal+ Systolic murmurs    Neuro/Psych Negative Neurological ROS  Negative Psych ROS   GI/Hepatic Neg liver ROS,   Endo/Other  Negative Endocrine ROSHypothyroidism   Renal/GU   Genitourinary negative   Musculoskeletal   Abdominal   Peds  Hematology negative hematology ROS (+)   Anesthesia Other Findings   Reproductive/Obstetrics negative OB ROS                          Anesthesia Physical Anesthesia Plan  ASA: III  Anesthesia Plan: General ETT   Post-op Pain Management:    Induction: Intravenous  Airway Management Planned: Oral ETT  Additional Equipment: TEE, Arterial line and PA Cath  Intra-op Plan:   Post-operative Plan: Post-operative intubation/ventilation  Informed Consent: I have reviewed the patients History and Physical, chart, labs and discussed the procedure including the risks, benefits and alternatives for the proposed anesthesia with the patient or authorized representative who has indicated his/her understanding and acceptance.   Dental Advisory Given  Plan Discussed with: CRNA  Anesthesia Plan Comments:        Anesthesia Quick Evaluation

## 2011-12-28 NOTE — Transfer of Care (Signed)
Immediate Anesthesia Transfer of Care Note  Patient: Carol [name]  Procedure(s) Performed:  AORTIC VALVE REPLACEMENT (AVR); CORONARY ARTERY BYPASS GRAFTING (CABG)  Patient Location: PACU and SICU  Anesthesia Type: General  Level of Consciousness: sedated and unresponsive  Airway & Oxygen Therapy: Patient remains intubated per anesthesia plan  Post-op Assessment: Report given to PACU RN and Post -op Vital signs reviewed and stable  Post vital signs: Reviewed and stable  Complications: No apparent anesthesia complications

## 2011-12-28 NOTE — Plan of Care (Signed)
OR called to advise me that patient pick-up would occur at approximately 05:45.  Enquired about patient status and travel information.  Made aware that patient is on IV heparin drip and currently have no order to hold prior to surgery.  OR recommended to hold at 05:00 per standard protocol given that patient is first case for CABG.  Verified hold with Vernard Gambles, RPh in main pharmacy.  Heparin IV drip held at 05:10 per protocol.  Continuing to monitor.

## 2011-12-28 NOTE — Preoperative (Signed)
Beta Blockers   Reason not to administer Beta Blockers:Not Applicable 

## 2011-12-28 NOTE — Procedures (Signed)
Extubation Procedure Note  Patient Details:   Name: Carol [name] DOB: 24-Nov-1928 MRN: [id number]   Airway Documentation:  Airway 8 mm (Active)  Secured at (cm) 22 cm 12/28/2011  3:53 PM  Measured From Teeth 12/28/2011  3:53 PM  Secured Location Right 12/28/2011  3:53 PM  Secured By Caron Presume Tape 12/28/2011  3:53 PM    Evaluation  O2 sats: stable throughout Complications: No apparent complications Patient did tolerate procedure well. Bilateral Breath Sounds: Diminished Suctioning: Airway Yes  Patients was extubated NIF 30, VC 575 mls, to [place] 6LPM sats 93, bp 99/41, HR 105. No stridor noted. RT will continue to monitor.    Arelia Sneddon 12/28/2011, 9:15 PM

## 2011-12-29 ENCOUNTER — Inpatient Hospital Stay (HOSPITAL_COMMUNITY): Payer: Medicare Other

## 2011-12-29 DIAGNOSIS — I2510 Atherosclerotic heart disease of native coronary artery without angina pectoris: Secondary | ICD-10-CM | POA: Diagnosis not present

## 2011-12-29 DIAGNOSIS — R918 Other nonspecific abnormal finding of lung field: Secondary | ICD-10-CM | POA: Diagnosis not present

## 2011-12-29 DIAGNOSIS — Z09 Encounter for follow-up examination after completed treatment for conditions other than malignant neoplasm: Secondary | ICD-10-CM | POA: Diagnosis not present

## 2011-12-29 DIAGNOSIS — J9819 Other pulmonary collapse: Secondary | ICD-10-CM | POA: Diagnosis not present

## 2011-12-29 LAB — MAGNESIUM
Magnesium: 3 mg/dL — ABNORMAL HIGH (ref 1.5–2.5)
Magnesium: 2.9 mg/dL — ABNORMAL HIGH (ref 1.5–2.5)

## 2011-12-29 LAB — CBC
HCT: 32.2 % — ABNORMAL LOW (ref 36.0–46.0)
HCT: 29.2 % — ABNORMAL LOW (ref 36.0–46.0)
Hemoglobin: 10.9 g/dL — ABNORMAL LOW (ref 12.0–15.0)
Hemoglobin: 9.9 g/dL — ABNORMAL LOW (ref 12.0–15.0)
MCH: 29.2 pg (ref 26.0–34.0)
MCH: 29.5 pg (ref 26.0–34.0)
MCHC: 33.9 g/dL (ref 30.0–36.0)
MCHC: 33.9 g/dL (ref 30.0–36.0)
MCV: 86.3 fL (ref 78.0–100.0)
MCV: 86.9 fL (ref 78.0–100.0)
Platelets: 94 10*3/uL — ABNORMAL LOW (ref 150–400)
Platelets: 90 10*3/uL — ABNORMAL LOW (ref 150–400)
RBC: 3.73 MIL/uL — ABNORMAL LOW (ref 3.87–5.11)
RBC: 3.36 MIL/uL — ABNORMAL LOW (ref 3.87–5.11)
RDW: 14.8 % (ref 11.5–15.5)
RDW: 15.3 % (ref 11.5–15.5)
WBC: 14.8 10*3/uL — ABNORMAL HIGH (ref 4.0–10.5)
WBC: 12.3 10*3/uL — ABNORMAL HIGH (ref 4.0–10.5)

## 2011-12-29 LAB — GLUCOSE, CAPILLARY
Glucose-Capillary: 131 mg/dL — ABNORMAL HIGH (ref 70–99)
Glucose-Capillary: 132 mg/dL — ABNORMAL HIGH (ref 70–99)
Glucose-Capillary: 136 mg/dL — ABNORMAL HIGH (ref 70–99)
Glucose-Capillary: 144 mg/dL — ABNORMAL HIGH (ref 70–99)
Glucose-Capillary: 154 mg/dL — ABNORMAL HIGH (ref 70–99)
Glucose-Capillary: 160 mg/dL — ABNORMAL HIGH (ref 70–99)

## 2011-12-29 LAB — POCT I-STAT, CHEM 8
BUN: 14 mg/dL (ref 6–23)
Calcium, Ion: 1.12 mmol/L (ref 1.12–1.32)
Chloride: 102 mEq/L (ref 96–112)
Creatinine, Ser: 0.8 mg/dL (ref 0.50–1.10)
Glucose, Bld: 151 mg/dL — ABNORMAL HIGH (ref 70–99)
HCT: 28 % — ABNORMAL LOW (ref 36.0–46.0)
Hemoglobin: 9.5 g/dL — ABNORMAL LOW (ref 12.0–15.0)
Potassium: 4.1 mEq/L (ref 3.5–5.1)
Sodium: 137 mEq/L (ref 135–145)
TCO2: 24 mmol/L (ref 0–100)

## 2011-12-29 LAB — CREATININE, SERUM
Creatinine, Ser: 0.72 mg/dL (ref 0.50–1.10)
GFR calc Af Amer: >90 mL/min (ref >90)
GFR calc non Af Amer: 78 mL/min — ABNORMAL LOW (ref >90)

## 2011-12-29 LAB — BASIC METABOLIC PANEL
BUN: 12 mg/dL (ref 6–23)
CO2: 27 mEq/L (ref 19–32)
Calcium: 7.5 mg/dL — ABNORMAL LOW (ref 8.4–10.5)
Chloride: 107 mEq/L (ref 96–112)
Creatinine, Ser: 0.68 mg/dL (ref 0.50–1.10)
GFR calc Af Amer: >90 mL/min (ref >90)
GFR calc non Af Amer: 80 mL/min — ABNORMAL LOW (ref >90)
Glucose, Bld: 151 mg/dL — ABNORMAL HIGH (ref 70–99)
Potassium: 4.1 mEq/L (ref 3.5–5.1)
Sodium: 141 mEq/L (ref 135–145)

## 2011-12-29 LAB — PREPARE PLATELET PHERESIS: Unit division: 0

## 2011-12-29 MED ORDER — INSULIN ASPART 100 UNIT/ML ~~LOC~~ SOLN
0.0000 [IU] | SUBCUTANEOUS | Status: DC
  Administered 2011-12-29 – 2011-12-30 (×6): 2 [IU] via SUBCUTANEOUS

## 2011-12-29 MED ORDER — ACETAMINOPHEN 650 MG RE SUPP: 650.0000 mg | RECTAL | Status: AC

## 2011-12-29 MED ORDER — ACETAMINOPHEN 160 MG/5ML PO SOLN: 650.0000 mg | ORAL | Status: AC

## 2011-12-29 NOTE — Progress Notes (Signed)
Dopamine gtt weaned off. HR increasing into the 120s at times. Urine output adequate and last creatinine 0.55

## 2011-12-29 NOTE — Progress Notes (Signed)
Unable to doppler pulses in left foot. Skin cool to touch with capillary refill less than 3 seconds. Patient has sensation in foot. Doppler results noted absent posterior tibial pulses prior to surgery, however Dr. Laneta Simmers notified at this time.

## 2011-12-29 NOTE — Progress Notes (Signed)
Patient ID: Carol [name], female   DOB: 23-Jun-1928, 78 y.o.   MRN: [id number] Filed Vitals:   12/29/11 1915 12/29/11 1955 12/29/11 2000 12/29/11 2100  BP:      Pulse: 97  113 88  Temp:  97.5 F (36.4 C)    TempSrc:  Oral    Resp: 20  21 14   Height:      Weight:      SpO2: 98%  97% 97%   Still on a little neo.  Otherwise stable day.  BMET    Component Value Date/Time   NA 137 12/29/2011 1705   K 4.1 12/29/2011 1705   CL 102 12/29/2011 1705   CO2 27 12/29/2011 0300   GLUCOSE 151* 12/29/2011 1705   BUN 14 12/29/2011 1705   CREATININE 0.80 12/29/2011 1705   CALCIUM 7.5* 12/29/2011 0300   GFRNONAA 78* 12/29/2011 1700   GFRAA >90 12/29/2011 1700    CBC    Component Value Date/Time   WBC 12.3* 12/29/2011 1700   WBC 8.8 09/12/2011 1248   RBC 3.36* 12/29/2011 1700   RBC 4.85 09/12/2011 1248   HGB 9.5* 12/29/2011 1705   HGB 13.1 09/12/2011 1248   HCT 28.0* 12/29/2011 1705   HCT 40.0 09/12/2011 1248   PLT 90* 12/29/2011 1700   PLT 171 09/12/2011 1248   MCV 86.9 12/29/2011 1700   MCV 82.5 09/12/2011 1248   MCH 29.5 12/29/2011 1700   MCH 27.0 09/12/2011 1248   MCHC 33.9 12/29/2011 1700   MCHC 32.8 09/12/2011 1248   RDW 15.3 12/29/2011 1700   RDW 16.4* 09/12/2011 1248   LYMPHSABS 1.6 09/12/2011 1248   LYMPHSABS 1.2 01/21/2011 0500   MONOABS 0.8 09/12/2011 1248   MONOABS 0.9 01/21/2011 0500   EOSABS 0.1 09/12/2011 1248   EOSABS 0.2 01/21/2011 0500   BASOSABS 0.0 09/12/2011 1248   BASOSABS 0.0 01/21/2011 0500    A/P:  Stable.  Continue present course.  Wean neo as tolerated.

## 2011-12-29 NOTE — Plan of Care (Signed)
Problem: Phase II Progression Outcomes Goal: Patient extubated within - Outcome: Completed/Met Date Met:  12/29/11 Extubated within 6 hours

## 2011-12-29 NOTE — Progress Notes (Signed)
1 Day Post-Op Procedure(s) (LRB): AORTIC VALVE REPLACEMENT (AVR) (N/A) CORONARY ARTERY BYPASS GRAFTING (CABG) (N/A) Subjective: Sore, but otherwise ok  Objective: Vital signs in last 24 hours: Temp:  [96.8 F (36 C)-99.7 F (37.6 C)] 97.5 F (36.4 C) (01/05 0700) Pulse Rate:  [72-123] 105  (01/05 0700) Cardiac Rhythm:  [-] Normal sinus rhythm (01/05 0400) Resp:  [0-21] 10  (01/05 0700) BP: (90-97)/(47-51) 97/50 mmHg (01/05 0500) SpO2:  [88 %-100 %] 95 % (01/05 0700) Arterial Line BP: (73-150)/(37-65) 93/42 mmHg (01/05 0700) FiO2 (%):  [40 %-50 %] 40 % (01/04 2045) Weight:  [75.3 kg (166 lb 0.1 oz)] 166 lb 0.1 oz (75.3 kg) (01/05 0515)  Hemodynamic parameters for last 24 hours: PAP: (18-44)/(8-28) 34/20 mmHg CO:  [3.1 L/min-5.2 L/min] 5.2 L/min CI:  [1.8 L/min/m2-3 L/min/m2] 3 L/min/m2  Intake/Output from previous day: 01/04 0701 - 01/05 0700 In: 8909.2 [I.V.:6425.2; ZOXWR:6045; IV Piggyback:800] Out: 6284 [Urine:5265; Emesis/NG output:100; Chest Tube:720] Intake/Output this shift:    General appearance: alert and cooperative Neurologic: intact Heart: regular rate and rhythm, S1, S2 normal, no murmur, click, rub or gallop Lungs: clear to auscultation bilaterally Extremities: edema mild in legs Wound: dressings dry  Lab Results:  Basename 12/29/11 0300 12/28/11 2201 12/28/11 2130  WBC 14.8* -- 13.7*  HGB 10.9* 11.6* --  HCT 32.2* 34.0* --  PLT 94* -- 88*   BMET:  Basename 12/29/11 0300 12/28/11 2201 12/28/11 0500  NA 141 144 --  K 4.1 4.1 --  CL 107 106 --  CO2 27 -- 22  GLUCOSE 151* 156* --  BUN 12 10 --  CREATININE 0.68 0.60 --  CALCIUM 7.5* -- 9.9    PT/INR:  Basename 12/28/11 1634  LABPROT 21.5*  INR 1.83*   ABG    Component Value Date/Time   PHART 7.329* 12/28/2011 2205   HCO3 24.8* 12/28/2011 2205   TCO2 26 12/28/2011 2205   ACIDBASEDEF 1.0 12/28/2011 2205   O2SAT 94.0 12/28/2011 2205   CBG (last 3)   Basename 12/29/11 0354 12/29/11 0203 12/29/11  0025  GLUCAP 144* 136* 154*   chest X-ray:  Left base atelectasis  Assessment/Plan: S/P Procedure(s) (LRB): AORTIC VALVE REPLACEMENT (AVR) (N/A) CORONARY ARTERY BYPASS GRAFTING (CABG) (N/A) DC milrinone.  Wean neo as tolerated. Mobilize Diuresis when off neo Diabetes control d/c tubes/lines See progression orders   LOS: 3 days    Aadin Gaut K 12/29/2011

## 2011-12-29 NOTE — Anesthesia Postprocedure Evaluation (Signed)
  Anesthesia Post-op Note  Patient: Carol [name]  Procedure(s) Performed:  AORTIC VALVE REPLACEMENT (AVR); CORONARY ARTERY BYPASS GRAFTING (CABG)  Patient Location: PACU and SICU  Anesthesia Type: General  Level of Consciousness: awake  Airway and Oxygen Therapy: Patient remains intubated per anesthesia plan  Post-op Pain: mild  Post-op Assessment: Post-op Vital signs reviewed  Post-op Vital Signs: stable  Complications: No apparent anesthesia complications

## 2011-12-30 ENCOUNTER — Inpatient Hospital Stay (HOSPITAL_COMMUNITY): Payer: Medicare Other

## 2011-12-30 DIAGNOSIS — R918 Other nonspecific abnormal finding of lung field: Secondary | ICD-10-CM | POA: Diagnosis not present

## 2011-12-30 DIAGNOSIS — J9819 Other pulmonary collapse: Secondary | ICD-10-CM | POA: Diagnosis not present

## 2011-12-30 DIAGNOSIS — Z954 Presence of other heart-valve replacement: Secondary | ICD-10-CM | POA: Diagnosis not present

## 2011-12-30 LAB — CBC
HCT: 27.4 % — ABNORMAL LOW (ref 36.0–46.0)
Hemoglobin: 9.3 g/dL — ABNORMAL LOW (ref 12.0–15.0)
MCH: 29.7 pg (ref 26.0–34.0)
MCHC: 33.9 g/dL (ref 30.0–36.0)
MCV: 87.5 fL (ref 78.0–100.0)
Platelets: 98 10*3/uL — ABNORMAL LOW (ref 150–400)
RBC: 3.13 MIL/uL — ABNORMAL LOW (ref 3.87–5.11)
RDW: 15.6 % — ABNORMAL HIGH (ref 11.5–15.5)
WBC: 12.2 10*3/uL — ABNORMAL HIGH (ref 4.0–10.5)

## 2011-12-30 LAB — BASIC METABOLIC PANEL
BUN: 19 mg/dL (ref 6–23)
CO2: 27 mEq/L (ref 19–32)
Calcium: 7.9 mg/dL — ABNORMAL LOW (ref 8.4–10.5)
Chloride: 101 mEq/L (ref 96–112)
Creatinine, Ser: 0.84 mg/dL (ref 0.50–1.10)
GFR calc Af Amer: 73 mL/min — ABNORMAL LOW (ref >90)
GFR calc non Af Amer: 63 mL/min — ABNORMAL LOW (ref >90)
Glucose, Bld: 120 mg/dL — ABNORMAL HIGH (ref 70–99)
Potassium: 4 mEq/L (ref 3.5–5.1)
Sodium: 135 mEq/L (ref 135–145)

## 2011-12-30 LAB — GLUCOSE, CAPILLARY
Glucose-Capillary: 139 mg/dL — ABNORMAL HIGH (ref 70–99)
Glucose-Capillary: 145 mg/dL — ABNORMAL HIGH (ref 70–99)
Glucose-Capillary: 103 mg/dL — ABNORMAL HIGH (ref 70–99)
Glucose-Capillary: 125 mg/dL — ABNORMAL HIGH (ref 70–99)
Glucose-Capillary: 143 mg/dL — ABNORMAL HIGH (ref 70–99)
Glucose-Capillary: 130 mg/dL — ABNORMAL HIGH (ref 70–99)

## 2011-12-30 MED ORDER — ZOLPIDEM TARTRATE 5 MG PO TABS
5.0000 mg | ORAL_TABLET | Freq: Every evening | ORAL | Status: DC | PRN
  Administered 2011-12-30: 5 mg via ORAL
  Filled 2011-12-30: qty 1

## 2011-12-30 NOTE — Progress Notes (Signed)
2 Days Post-Op Procedure(s) (LRB): AORTIC VALVE REPLACEMENT (AVR) (N/A) CORONARY ARTERY BYPASS GRAFTING (CABG) (N/A) Subjective:  No complaints  Objective: Vital signs in last 24 hours: Temp:  [97.5 F (36.4 C)-98.3 F (36.8 C)] 98 F (36.7 C) (01/06 1225) Pulse Rate:  [48-115] 52  (01/06 1245) Cardiac Rhythm:  [-] Sinus tachycardia (01/06 1245) Resp:  [12-28] 21  (01/06 1245) BP: (76-142)/(37-71) 101/58 mmHg (01/06 0500) SpO2:  [91 %-99 %] 95 % (01/06 1245) Arterial Line BP: (88-137)/(29-60) 100/31 mmHg (01/06 1245) Weight:  [76.7 kg (169 lb 1.5 oz)] 169 lb 1.5 oz (76.7 kg) (01/06 0600)  Hemodynamic parameters for last 24 hours:    Intake/Output from previous day: 01/05 0701 - 01/06 0700 In: 1358 [P.O.:150; I.V.:1108; IV Piggyback:100] Out: 880 [Urine:670; Chest Tube:210] Intake/Output this shift: Total I/O In: 497.6 [P.O.:300; I.V.:147.6; IV Piggyback:50] Out: 155 [Urine:155]  General appearance: alert and cooperative Heart: regular rate and rhythm Lungs: clear to auscultation bilaterally Extremities: edema moderate diffuse Wound: dressing dry  Lab Results:  Basename 12/30/11 0400 12/29/11 1705 12/29/11 1700  WBC 12.2* -- 12.3*  HGB 9.3* 9.5* --  HCT 27.4* 28.0* --  PLT 98* -- 90*   BMET:  Basename 12/30/11 0400 12/29/11 1705 12/29/11 0300  NA 135 137 --  K 4.0 4.1 --  CL 101 102 --  CO2 27 -- 27  GLUCOSE 120* 151* --  BUN 19 14 --  CREATININE 0.84 0.80 --  CALCIUM 7.9* -- 7.5*    PT/INR:  Basename 12/28/11 1634  LABPROT 21.5*  INR 1.83*   ABG    Component Value Date/Time   PHART 7.329* 12/28/2011 2205   HCO3 24.8* 12/28/2011 2205   TCO2 24 12/29/2011 1705   ACIDBASEDEF 1.0 12/28/2011 2205   O2SAT 94.0 12/28/2011 2205   CBG (last 3)   Basename 12/30/11 1223 12/30/11 0734 12/30/11 0403  GLUCAP 143* 125* 103*   CXR:  Left basilar atelectasis  Assessment/Plan: S/P Procedure(s) (LRB): AORTIC VALVE REPLACEMENT (AVR) (N/A) CORONARY ARTERY BYPASS  GRAFTING (CABG) (N/A) Still on a little neo.  Wean as tolerated.  Hold off on diuresis until bp stable off neo.   LOS: 4 days    [place] Delong K 12/30/2011

## 2011-12-31 ENCOUNTER — Encounter (HOSPITAL_COMMUNITY): Payer: Self-pay | Admitting: Cardiothoracic Surgery

## 2011-12-31 ENCOUNTER — Inpatient Hospital Stay (HOSPITAL_COMMUNITY): Payer: Medicare Other

## 2011-12-31 DIAGNOSIS — J9819 Other pulmonary collapse: Secondary | ICD-10-CM | POA: Diagnosis not present

## 2011-12-31 DIAGNOSIS — J90 Pleural effusion, not elsewhere classified: Secondary | ICD-10-CM | POA: Diagnosis not present

## 2011-12-31 LAB — TYPE AND SCREEN
ABO/RH(D): O POS
Antibody Screen: POSITIVE
DAT, IgG: POSITIVE
Unit division: 0
Unit division: 0
Unit division: 0
Unit division: 0
Unit division: 0
Unit division: 0

## 2011-12-31 LAB — CBC
HCT: 25.2 % — ABNORMAL LOW (ref 36.0–46.0)
Hemoglobin: 8.6 g/dL — ABNORMAL LOW (ref 12.0–15.0)
MCH: 30 pg (ref 26.0–34.0)
MCHC: 34.1 g/dL (ref 30.0–36.0)
MCV: 87.8 fL (ref 78.0–100.0)
Platelets: 85 10*3/uL — ABNORMAL LOW (ref 150–400)
RBC: 2.87 MIL/uL — ABNORMAL LOW (ref 3.87–5.11)
RDW: 15.2 % (ref 11.5–15.5)
WBC: 10.1 10*3/uL (ref 4.0–10.5)

## 2011-12-31 LAB — BASIC METABOLIC PANEL
BUN: 23 mg/dL (ref 6–23)
CO2: 27 mEq/L (ref 19–32)
Calcium: 8.1 mg/dL — ABNORMAL LOW (ref 8.4–10.5)
Chloride: 97 mEq/L (ref 96–112)
Creatinine, Ser: 0.64 mg/dL (ref 0.50–1.10)
GFR calc Af Amer: >90 mL/min (ref >90)
GFR calc non Af Amer: 81 mL/min — ABNORMAL LOW (ref >90)
Glucose, Bld: 111 mg/dL — ABNORMAL HIGH (ref 70–99)
Potassium: 4.2 mEq/L (ref 3.5–5.1)
Sodium: 130 mEq/L — ABNORMAL LOW (ref 135–145)

## 2011-12-31 MED ORDER — ALUM & MAG HYDROXIDE-SIMETH 200-200-20 MG/5ML PO SUSP: 30.0000 mL | ORAL | Status: DC | PRN

## 2011-12-31 MED ORDER — METOPROLOL TARTRATE 12.5 MG HALF TABLET
12.5000 mg | ORAL_TABLET | Freq: Two times a day (BID) | ORAL | Status: DC
  Administered 2011-12-31 (×2): 12.5 mg via ORAL
  Filled 2011-12-31 (×4): qty 1

## 2011-12-31 MED ORDER — DIPHENHYDRAMINE HCL 25 MG PO CAPS
25.0000 mg | ORAL_CAPSULE | Freq: Every evening | ORAL | Status: DC | PRN
  Administered 2012-01-01: 25 mg via ORAL
  Filled 2011-12-31: qty 1

## 2011-12-31 MED ORDER — FUROSEMIDE 10 MG/ML IJ SOLN
20.0000 mg | Freq: Once | INTRAMUSCULAR | Status: AC
  Administered 2011-12-31: 20 mg via INTRAVENOUS
  Filled 2011-12-31: qty 2

## 2011-12-31 MED FILL — Sodium Chloride Irrigation Soln 0.9%: Qty: 3000 | Status: AC

## 2011-12-31 MED FILL — Electrolyte-R (PH 7.4) Solution: INTRAVENOUS | Qty: 7000 | Status: AC

## 2011-12-31 MED FILL — Sodium Chloride IV Soln 0.9%: INTRAVENOUS | Qty: 1000 | Status: AC

## 2011-12-31 MED FILL — Heparin Sodium (Porcine) Inj 1000 Unit/ML: INTRAMUSCULAR | Qty: 90 | Status: AC

## 2011-12-31 NOTE — Op Note (Signed)
NAME:  Carol [name], Carol [name] NO.:  MEDICAL RECORD NO.:  [id number]  LOCATION:                                 FACILITY:  PHYSICIAN:  Sheliah Plane, MD    DATE OF BIRTH:  04-Nov-1928  DATE OF PROCEDURE:  12/28/2011 DATE OF DISCHARGE:                              OPERATIVE REPORT   PREOPERATIVE DIAGNOSES: 1. Critical aortic stenosis. 2. Critical left main obstruction. 3. Indeterminate left upper lobe lung nodule.  POSTOPERATIVE DIAGNOSES: 1. Critical aortic stenosis. 2. Critical left main obstruction. 3. Indeterminate left upper lobe lung nodule.  SURGICAL PROCEDURE:  Aortic valve replacement with pericardial tissue valve, [employer], model 3300TFX 19 mm, serial number [id number]; coronary artery bypass grafting x3 with the left internal mammary to the left anterior descending coronary artery, reverse saphenous vein graft to the intermediate coronary artery, reverse saphenous vein graft to the distal right coronary artery with both endo-vein harvesting and open left lower leg vein harvest, and wedge resection of left upper lobe lung nodule.  SURGEON:  Sheliah Plane, M.D.  FIRST ASSISTANT:  Coral Ceo PA.  BRIEF HISTORY:  The patient is an 78 year old female with longstanding history of aortic stenosis, who presents with increasing episodes of congestive heart failure with dyspnea.  She denies syncope.  She denies angina.  She has also had an evaluation of a left upper lobe lung nodule by Dr. Sherene Sires, that was thought to be benign.  In addition, the patient has had a history of questionable hemolytic anemia.  She was admitted by Dr. Donnie Aho for cardiac catheterizations.  At the time of catheterization, confirmed aortic valve area of 0.55 by echo and a velocity of 380 cm/second across the aortic valve.  LV function was moderately depressed. At cath, she was found to have a large dominant right coronary artery with 60-70% stenosis and diffuse  disease and also a critical left main.  Very limited shots of the left system were taken making it difficult to fully evaluate the left coronary arterial tree. However, with the patient's symptomatology, critical left main disease and severe aortic stenosis, aortic valve replacement and coronary artery bypass grafting was recommended to the patient who agreed and signed informed consent.  DESCRIPTION OF PROCEDURE:  With Swan-Ganz and arterial line monitors in place, the patient underwent general endotracheal anesthesia without incident.  The skin of the chest and legs was prepped with Betadine and draped in usual sterile manner.  Initially, we started in the right leg at the knee.  We made a small incision.  No satisfactory vein could be located on the right side.  A similar incision just above the left knee was made and no adequate vein could be located here.  The patient appeared to have adequate vein in the left lower extremity.  A incision was made and the left lower leg vein was harvested with the open technique.  As the vein approached the knee and went markedly posterior, the endo-vein scope was then used to gain some more length on the vein into the posterior thigh.  The vein had a somewhat unusual route, but was above the knee, but was able to be harvested and provide  adequate conduit.  Median sternotomy was performed.  Left internal mammary artery was dissected down its pedicle graft.  The distal artery had good free flow.  While route tract was in place, palpation of the left upper lobe revealed approximately 1-1/2 cm nodule pleural-based with puckering and pleural changes at the site.  Although, this previously had been evaluated and thought to be benign.  There was no tissue diagnosis.  It was felt that, since the mass was very peripheral and there was easy access, while dissecting the mammary down we did a wedge resection of the left upper lobe lung lesion.  The margins  were negative.  The initial frozen section suggested granulomatous disease.  The pericardium was opened.  The ascending aorta and the right atrium were cannulated after systemic heparinization.  An aortic root vent cardioplegia needle was introduced into the ascending aorta.  Dr. Randa Evens, Anesthesia, placed a TEE probe and confirmed severe aortic stenosis with a tricuspid valve, ejection fraction approximately 50%, very trivial mitral regurgitation.  The patient was placed on cardiopulmonary bypass 2.4 L/minute/meter square.  Sites of anastomosis were selected and dissected out of the epicardium.  The patient's body temperature was cooled to 30 degrees.  Right superior pulmonary vein vent was placed.  Aortic crossclamp was applied and 800 mL of cold blood potassium cardioplegia was administered with diastolic arrest of the heart.  Attention was turned first to the coronaries.  The right coronary artery was somewhat diffusely diseased and thickened with the distal vessel was suitable for bypass.  The vessel was opened and using a running 7-0 Prolene, a distal anastomosis was performed with a segment of reverse saphenous vein graft.  The distal extent of this right coronary artery supplied a good portion of the posterior lateral wall of the heart as it appeared on examination of the heart that the circumflex system was relatively small.  There was a large intermediate branch and then a very small distal circumflex branch.  The large intermediate branch was intramyocardial vessel, was dissected out and opened and admitted a 1.5 mm probe distally.  Using a running 7 Prolene, a distal anastomosis was performed with a segment of reverse saphenous vein graft.  Attention was then turned to the left anterior descending coronary artery and the midportion of the vessel was opened, admitted a 1.5 mm probe distally. Using a running 8-0 Prolene, left internal mammary artery was anastomosed to the left  anterior descending coronary artery.  The fascia of the mammary artery was tacked to the epicardium.  Attention was then turned to the aortic valve, an additional cold blood cardioplegia was administered down the vein grafts and into the aortic root.  A transverse aortotomy was then performed.  The patient's aortic root was significantly calcified.  The aortic valve was calcified and was a 3- leaflet valve.  The valve was excised and debrided.  A 19 pericardial tissue valve easily could be placed.  Because of the degree of calcification in the aortic root, a 21 pericardial tissue valve would not fit.  The patient has a relatively small body surface area and it is felt that a 19 valve would be adequate.  A #2 Tycron pledgeted sutures were placed circumferentially and used to secure the pericardial tissue valve in place.  The valve was seated well.  There was no impingement on the right or the left coronary ostium.  The ostium of the left main coronary artery was easily visualized and as suggested on the catheterization had  critical stenosis.  The aortotomy was then closed with horizontal mattress sutures 4-0 Prolene suture over felt strips. Two punch aortotomies were performed and each of the 2 vein grafts were anastomosed to the ascending aorta.  Prior to completion of the aortotomy, the heart was allowed to passively fill and de-air.  The bulldog was removed from the mammary artery with prompt rise in myocardial septal temperature.  Aortic crossclamp was then removed. Total crossclamp time of 137 minutes.  The patient spontaneously converted to a sinus rhythm.  TEE showed a 16-gauge needle was introduced into the left ventricular apex to further de-air the heart. The heart was allowed to passively fill.  TEE showed good functioning of the ventricle and of the newly implanted aortic valve without evidence of aortic insufficiency.  The patient's body temperature was rewarmed to 37 degrees.   She was then ventilated and weaned from cardiopulmonary bypass.  She remained hemodynamically stable.  A test dose of protamine was administered, but as we approached the halfway point on protamine administration, the patient's pulmonary artery pressure began rising and blood pressure decreasing, requiring increasing pressor levels.  TEE showed an increased amount of mitral regurgitation and hypokinesis of the posterolateral wall primarily.  This was presumed ischemic in origin or perhaps because of air in the grafts, although no air was obviously noted.  We decided to put the patient back on cardiopulmonary bypass, she was re-heparinized, the venous cannula was replaced, and arterial cannula had not been removed.  She was placed back on full cardiopulmonary bypass.  We rested her a few minutes.  Each of the graft was carefully inspected.  There were no kinks or obvious technical difficulties.  Each graft had Doppler flow present.  Approximately 30 minutes, ventricle very rapidly appeared beating normally with sinus rhythm.  We are then again attempted separation of cardiopulmonary bypass on low-dose dopamine.  With this attempt, the patient was separated from bypass without difficulty.  The mitral regurgitation had disappeared and the inferolateral wall was functioning well.  With this reversibility, it was presumed that there was air possibly in the right graft.  The patient, on the second attempt, was separated from bypass without difficulty.  Protamine sulfate was also administered without difficulty.  She was decannulated in usual fashion.  Atrial and ventricular pacing wires had been applied.  A left pleural tube and a Blake mediastinal drain were left in place.  Pericardium was loosely reapproximated.  Sternum closed with #6 stainless steel wire.  Fascia closed with interrupted 0 Vicryl, running 3-0 Vicryl in subcutaneous tissue, and 3-0 subcuticular stitch in the skin edges.  Dry  dressings were applied.  Sponge and needle count was reported as correct at the completion of procedure.  Because of low hematocrit while on bypass, the patient did require 2 units of packed red blood cells.  She also had evidence of thrombocytopenia as we were separating from bypass and a unit of platelets was also given.  A total pump time was 171 minutes plus 35 minutes for a total of 206 minutes.  The patient was transferred to Surgical Intensive Care Unit for further postoperative care, intubated, but hemodynamically stable.     Sheliah Plane, MD     EG/MEDQ  D:  12/29/2011  T:  12/29/2011  Job:  [id number]  cc:   Georga Hacking, M.D.

## 2011-12-31 NOTE — Progress Notes (Signed)
Subjective:  Doing very well post CABG/AVR.  Denies severe SOB, moderate amount of pain.  Off neo now.  Objective:  Vital Signs in the last 24 hours: BP 148/95  Pulse 51  Temp(Src) 97.8 F (36.6 C) (Oral)  Resp 18  Ht 5\' 4"  (1.626 m)  Wt 78.4 kg (172 lb 13.5 oz)  BMI 29.67 kg/m2  SpO2 96%  Physical Exam: Pleasant WF in NAD, alert oriented Lungs: Reduced breath sounds Cardiac:  Regular rhythm, normal S1 and S2, no S3, soft 1/6 murmur Abdomen:  Soft, nontender, no masses Extremities:  2+ edema present  Intake/Output from previous day: 01/06 0701 - 01/07 0700 In: 1012.4 [P.O.:450; I.V.:512.4; IV Piggyback:50] Out: 600 [Urine:600]  Weight change: 1.7 kg (3 lb 12 oz)  Lab Results: Basic Metabolic Panel:  Basename 12/31/11 0500 12/30/11 0400  NA 130* 135  K 4.2 4.0  CL 97 101  CO2 27 27  GLUCOSE 111* 120*  BUN 23 19  CREATININE 0.64 0.84    CBC:  Basename 12/31/11 0500 12/30/11 0400  WBC 10.1 12.2*  NEUTROABS -- --  HGB 8.6* 9.3*  HCT 25.2* 27.4*  MCV 87.8 87.5  PLT 85* 98*    PROTIME: Lab Results  Component Value Date   INR 1.83* 12/28/2011   INR 1.00 12/27/2011   INR 0.98 01/20/2011    Telemetry: Reviewed , sinus tachycardia  Assessment/Plan:  1. Recent AVR/CABG 2. Blood loss anemia 3. PVC's  REC:  Needs diuresis  ? Transfer.   Darden Palmer.  MD [facility] 12/31/2011, 10:01 AM

## 2011-12-31 NOTE — Progress Notes (Signed)
Physical Therapy Evaluation Patient Details Name: Carol [name] MRN: [id number] DOB: August 15, 1928 Today's Date: 12/31/2011  Problem List:  Patient Active Problem List  Diagnoses  . Pulmonary nodule  . Aortic stenosis  . Hypertensive heart disease without CHF  . Hyperlipidemia  . Hypothyroidism  . Lumbar degenerative disc disease  . CAD (coronary artery disease)  . History of Villous adenoma of rectum  . S/P AVR and CABG    Past Medical History:  Past Medical History  Diagnosis Date  . Hyperlipidemia   . Hypothyroidism   . Spinal stenosis   . Lumbar disc disease   . Obesity   . Aortic stenosis   . Dyspnea   . Lung nodules     Sees Wert, thought to be benign  . Hypertensive heart disease without CHF   . Villous adenoma of rectum   . Coronary artery disease     Cath 1/13 left main   Past Surgical History:  Past Surgical History  Procedure Date  . Partial colectomy 2008    Villous adenoma  . Hernia repair   . Lumbar laminectomy   . Cholecystectomy   . Cardiac catheterization 2013  . Aortic valve replacement 12/28/2011    Procedure: AORTIC VALVE REPLACEMENT (AVR);  Surgeon: Delight Ovens, MD;  Location: [facility] OR;  Service: Open Heart Surgery;  Laterality: N/A;  . Coronary artery bypass graft 12/28/2011    Procedure: CORONARY ARTERY BYPASS GRAFTING (CABG);  Surgeon: Delight Ovens, MD;  Location: [facility] OR;  Service: Open Heart Surgery;  Laterality: N/A;    PT Assessment/Plan/Recommendation PT Assessment Clinical Impression Statement: Pt s/p CABG and aortic valve replace POD #3. PT consulted for mobility. Pt presents standing in room with nsg about to ambulate. Demonstrates decreased activity tolerance, gereralized weakness, and decreased knowledge of sternal precautions. Will benefit physical therapy in the acute setting for these and the following problem list so as to maximized safe mobility and independence for d/c home with support from family. Will also benefit from  HHPT upon d/c.  PT Recommendation/Assessment: Patient will need skilled PT in the acute care venue PT Problem List: Decreased strength;Decreased activity tolerance;Decreased knowledge of precautions;Decreased knowledge of use of DME;Decreased mobility;Cardiopulmonary status limiting activity PT Therapy Diagnosis : Abnormality of gait;Generalized weakness PT Plan PT Frequency: Min 3X/week PT Treatment/Interventions: DME instruction;Gait training;Stair training;Functional mobility training;Neuromuscular re-education;Balance training;Therapeutic exercise;Therapeutic activities;Patient/family education PT Recommendation Recommendations for Other Services: OT consult Follow Up Recommendations: Home health PT;Supervision/Assistance - 24 hour (initally) Equipment Recommended: None recommended by PT (pt has RW at home) PT Goals  Acute Rehab PT Goals PT Goal Formulation: With patient Pt will Roll Supine to Right Side: with min assist PT Goal: Rolling Supine to Right Side - Progress: Progressing toward goal Pt will Roll Supine to Left Side: with min assist PT Goal: Rolling Supine to Left Side - Progress: Progressing toward goal Pt will go Supine/Side to Sit: with min assist PT Goal: Supine/Side to Sit - Progress: Progressing toward goal Pt will go Sit to Supine/Side: with min assist PT Goal: Sit to Supine/Side - Progress: Progressing toward goal Pt will go Sit to Stand: with min assist PT Goal: Sit to Stand - Progress: Progressing toward goal Pt will go Stand to Sit: with min assist PT Goal: Stand to Sit - Progress: Progressing toward goal Pt will Transfer Bed to Chair/Chair to Bed: with min assist PT Transfer Goal: Bed to Chair/Chair to Bed - Progress: Progressing toward goal Pt will Ambulate: >150 feet;with modified  independence;with least restrictive assistive device PT Goal: Ambulate - Progress: Progressing toward goal Pt will Go Up / Down Stairs: 1-2 stairs;with min assist;with least  restrictive assistive device PT Goal: Up/Down Stairs - Progress: Progressing toward goal Additional Goals Additional Goal #1: Pt will verbalize and demonstrate knowledge of 3 sternal precautions. PT Goal: Additional Goal #1 - Progress: Progressing toward goal  PT Evaluation Precautions/Restrictions  Precautions Precautions: Sternal Prior Functioning  Home Living Lives With: Spouse Receives Help From: Family (daughter to come for a week once she d/c's) Type of Home: House Home Layout: One level Home Access: Stairs to enter Secretary/administrator of Steps: 1 Bathroom Shower/Tub: Health visitor: Standard Home Adaptive Equipment: Environmental consultant - rolling;Built-in shower seat;Grab bars in shower Prior Function Level of Independence: Independent with basic ADLs;Independent with homemaking with ambulation;Independent with gait;Independent with transfers Comments: daughter to come stay with patient for the first week once she goes home. Husband walks with a cane.  Cognition Cognition Arousal/Alertness: Awake/alert Overall Cognitive Status: Appears within functional limits for tasks assessed Orientation Level: Oriented X4 Sensation/Coordination Sensation Light Touch: Appears Intact Coordination Gross Motor Movements are Fluid and Coordinated: Yes Fine Motor Movements are Fluid and Coordinated: Yes Extremity Assessment RUE Assessment RUE Assessment: Not tested LUE Assessment LUE Assessment: Not tested RLE Assessment RLE Assessment:  (grossly 4/5) LLE Assessment LLE Assessment:  (grossly 4/5 tested functionally) Mobility (including Balance) Bed Mobility Bed Mobility: Yes Sit to Supine - Left DO NOT USE: 1: +2 Total assist (20%) Sit to Supine - Left Details DO NOT USE: pt able to slightly assist with elevating LE to bed but primarily lifted and laid down by nursing and therapist to avoid pt pushing/pulling from sternal precautions Scooting to HOB: 1: +2 Total assist  (0%) Transfers Transfers: Yes Sit to Stand: 1: +2 Total assist Sit to Stand Details (indicate cue type and reason): +2totalpt50% with cueing for sternal precautions and follow through to stand Stand to Sit: 1: +2 Total assist Stand to Sit Details: +2totalpt50% with bilateral faciliation to guide hips to chair/bed  Ambulation/Gait Ambulation/Gait: Yes Ambulation/Gait Assistance: 4: Min assist Ambulation/Gait Assistance Details (indicate cue type and reason): pt amb approx 96 ft pushing w/c; cueing for more controlled diaphragmatic breathing as pt tending towards very shallow short breaths; demonstrated and explained benefits for this breathing; her O2 sats were not reading correctly as they were showing 50% with poor wave form; pts hand also very cold;  pt sat for 1 minute rest break after about 80 ft to catch her breath; SaO2 reading > 90% at end of session on 2 L Ambulation Distance (Feet): 96 Feet Assistive device:  (pushing w/c) Gait Pattern: Shuffle;Trunk flexed  Posture/Postural Control Posture/Postural Control: No significant limitations Balance Balance Assessed: No Exercise  Total Joint Exercises Ankle Circles/Pumps: AROM;Both;5 reps;Supine End of Session PT - End of Session Equipment Utilized During Treatment: Gait belt Activity Tolerance: Patient limited by fatigue;Treatment limited secondary to medical complications (Comment) (increased DOE) Patient left: in bed Nurse Communication: Mobility status for transfers;Mobility status for ambulation General Behavior During Session: [facility] for tasks performed Cognition: [facility] for tasks performed  [facility] HELEN 12/31/2011, 3:16 PM

## 2011-12-31 NOTE — Progress Notes (Signed)
Patient ID: Carol [name], female   DOB: 03-01-1928, 78 y.o.   MRN: [id number] TCTS DAILY PROGRESS NOTE                   [address]            [place] [address]          [phone]      3 Days Post-Op Procedure(s) (LRB): AORTIC VALVE REPLACEMENT (AVR) (N/A) CORONARY ARTERY BYPASS GRAFTING (CABG) (N/A)  Total Length of Stay:  LOS: 5 days   Subjective: dyspnea at rest, ambulating some with help, awake and alert  Objective: Vital signs in last 24 hours: Patient Vitals for the past 24 hrs:  BP Temp Temp src Pulse Resp SpO2 Weight  12/31/11 1209 - 97.7 F (36.5 C) Oral - - - -  12/31/11 1100 116/65 mmHg - - 82  25  95 % -  12/31/11 1000 - - - 53  20  99 % -  12/31/11 0900 110/62 mmHg - - 52  23  99 % -  12/31/11 0800 144/48 mmHg - - 52  18  98 % -  12/31/11 0715 - 97.8 F (36.6 C) Oral - - - -  12/31/11 0700 148/95 mmHg - - 51  18  96 % -  12/31/11 0600 116/63 mmHg - - 104  19  99 % -  12/31/11 0500 111/48 mmHg - - 61  17  100 % 172 lb 13.5 oz (78.4 kg)  12/31/11 0400 106/65 mmHg - - 94  17  99 % -  12/31/11 0322 - 97 F (36.1 C) Oral - - - -  12/31/11 0300 100/53 mmHg - - 96  16  99 % -  12/31/11 0200 100/69 mmHg - - 90  16  98 % -  12/31/11 0100 95/50 mmHg - - 86  17  99 % -  12/31/11 0000 109/49 mmHg 97.3 F (36.3 C) Oral 95  18  99 % -  12/30/11 2300 - - - 97  18  98 % -  12/30/11 2200 110/70 mmHg - - 105  29  100 % -  12/30/11 2115 92/52 mmHg - - 104  19  97 % -  12/30/11 2100 90/57 mmHg - - 57  19  96 % -  12/30/11 2000 107/65 mmHg - - - 34  38 % -  12/30/11 1959 - 97.5 F (36.4 C) Oral - - - -  12/30/11 1900 112/74 mmHg - - 54  22  98 % -  12/30/11 1800 135/98 mmHg - - 54  22  99 % -  12/30/11 1715 108/69 mmHg - - 53  21  98 % -  12/30/11 1700 112/80 mmHg - - 106  25  98 % -  12/30/11 1645 99/56 mmHg - - 105  17  98 % -  12/30/11 1640 - 98 F (36.7 C) Oral - - - -  12/30/11 1630 96/56 mmHg - - 106  21  98 % -  12/30/11 1615 84/49  mmHg - - 104  19  96 % -  12/30/11 1600 98/63 mmHg - - 50  17  99 % -  12/30/11 1530 96/64 mmHg - - 104  19  98 % -  12/30/11 1445 96/61 mmHg - - 52  17  96 % -  12/30/11 1415 92/55 mmHg - - 106  23  95 % -  12/30/11 1400 96/64  mmHg - - 53  22  96 % -  12/30/11 1345 89/55 mmHg - - 63  28  96 % -   Wt Readings from Last 3 Encounters:  12/31/11 172 lb 13.5 oz (78.4 kg)  12/31/11 172 lb 13.5 oz (78.4 kg)  12/31/11 172 lb 13.5 oz (78.4 kg)      Intake/Output from previous day: 01/06 0701 - 01/07 0700 In: 1012.4 [P.O.:450; I.V.:512.4; IV Piggyback:50] Out: 600 [Urine:600]  Intake/Output this shift: Total I/O In: 182 [P.O.:100; I.V.:80; IV Piggyback:2] Out: 500 [Urine:500]  Current Meds: Scheduled Meds:   . acetaminophen  1,000 mg Oral Q6H   Or  . acetaminophen (TYLENOL) oral liquid 160 mg/5 mL  975 mg Per Tube Q6H  . aspirin EC  325 mg Oral Daily   Or  . aspirin  324 mg Per Tube Daily  . bisacodyl  10 mg Oral Daily   Or  . bisacodyl  10 mg Rectal Daily  . cholecalciferol  400 Units Oral Daily  . docusate sodium  200 mg Oral Daily  . furosemide  20 mg Intravenous Once  . gabapentin  300 mg Oral TID  . levothyroxine  75 mcg Oral Daily  . metoprolol tartrate  12.5 mg Oral BID  . mulitivitamin with minerals  1 tablet Oral Daily  . pantoprazole  40 mg Oral Q1200  . rosuvastatin  20 mg Oral QHS  . sodium chloride  3 mL Intravenous Q12H  . vitamin C  500 mg Oral Daily  . DISCONTD: insulin aspart  0-24 Units Subcutaneous Q4H   Continuous Infusions:   . sodium chloride 20 mL/hr at 12/29/11 0600  . sodium chloride    . sodium chloride    . lactated ringers 20 mL/hr at 12/29/11 0600  . DISCONTD: phenylephrine (NEO-SYNEPHRINE) Adult infusion Stopped (12/30/11 1900)   PRN Meds:.diphenhydrAMINE, ondansetron (ZOFRAN) IV, oxyCODONE, sodium chloride, DISCONTD: metoprolol, DISCONTD: morphine, DISCONTD: zolpidem Sinus rhythm General appearance: alert, cooperative and no  distress Neurologic: intact Heart: regular rate and rhythm, S1, S2 normal, no murmur, click, rub or gallop Lungs: clear to auscultation bilaterally Abdomen: soft, non-tender; bowel sounds normal; no masses,  no organomegaly Extremities: extremities normal, atraumatic, no cyanosis or edema, Homans sign is negative, no sign of DVT, no edema, redness or tenderness in the calves or thighs and no ulcers, gangrene or trophic changes Wound: sternum stable  Lab Results: CBC: Basename 12/31/11 0500 12/30/11 0400  WBC 10.1 12.2*  HGB 8.6* 9.3*  HCT 25.2* 27.4*  PLT 85* 98*   BMET:  Basename 12/31/11 0500 12/30/11 0400  NA 130* 135  K 4.2 4.0  CL 97 101  CO2 27 27  GLUCOSE 111* 120*  BUN 23 19  CREATININE 0.64 0.84  CALCIUM 8.1* 7.9*    PT/INR:  Basename 12/28/11 1634  LABPROT 21.5*  INR 1.83*   Radiology: Dg Chest Port 1 View  12/31/2011  *RADIOLOGY REPORT*  Clinical Data: Atelectasis and effusions.  PORTABLE CHEST - 1 VIEW  Comparison: 01/06 and 12/29/2011  Findings: No pneumothorax. Sheath remains in the superior vena cava.  Improving slight atelectasis in the left upper lobe.  Slightly increased small right effusion.  Small left effusion with slight atelectasis at the base, unchanged.  Persistent chronic cardiomegaly.  Vascularity is normal.  IMPRESSION:  1.  Improving atelectasis in the left upper lung zone. 2.  Slight increase in small right effusion.  Original Report Authenticated By: Gwynn Burly, M.D.   Dg Chest Portable 1  View In Am  12/30/2011  *RADIOLOGY REPORT*  Clinical Data: Coronary bypass, aortic valve replacement  PORTABLE CHEST - 1 VIEW  Comparison: 12/29/2011  Findings: Mediastinal drain, Swan-Ganz catheter and left chest tube all removed.  Right IJ vascular sheath remains in stable position. Coronary bypass changes and aortic valve replacement noted.  Heart remains enlarged with vascular congestion and left lower lobe atelectasis / consolidation.  No pneumothorax.   Stable focal left upper lobe opacity, status post left upper lobe wedge resection.  IMPRESSION: Stable postoperative portable chest exam.  No pneumothorax.  Original Report Authenticated By: Judie Petit. Ruel Favors, M.D.     Assessment/Plan: S/P Procedure(s) (LRB): AORTIC VALVE REPLACEMENT (AVR) (N/A) CORONARY ARTERY BYPASS GRAFTING (CABG) (N/A) Mobilize Diuresis Acute blood loss anemia Thrombocytopenia avoid heparin for now   Delight Ovens MD  Beeper [phone] Office [phone] 12/31/2011 1:39 PM

## 2011-12-31 NOTE — Plan of Care (Addendum)
Problem: Phase III Progression Outcomes Goal: Ambulates with pain/dyspnea controlled Outcome: Progressing Pt still demonstrating DOE during ambulation today with 2 liters and demonstration for pursed lip breathing.

## 2012-01-01 ENCOUNTER — Inpatient Hospital Stay (HOSPITAL_COMMUNITY): Payer: Medicare Other

## 2012-01-01 DIAGNOSIS — Z951 Presence of aortocoronary bypass graft: Secondary | ICD-10-CM | POA: Diagnosis not present

## 2012-01-01 DIAGNOSIS — J90 Pleural effusion, not elsewhere classified: Secondary | ICD-10-CM | POA: Diagnosis not present

## 2012-01-01 DIAGNOSIS — J9819 Other pulmonary collapse: Secondary | ICD-10-CM | POA: Diagnosis not present

## 2012-01-01 LAB — CBC
HCT: 27.1 % — ABNORMAL LOW (ref 36.0–46.0)
Hemoglobin: 9.3 g/dL — ABNORMAL LOW (ref 12.0–15.0)
MCH: 30 pg (ref 26.0–34.0)
MCHC: 34.3 g/dL (ref 30.0–36.0)
MCV: 87.4 fL (ref 78.0–100.0)
Platelets: 142 10*3/uL — ABNORMAL LOW (ref 150–400)
RBC: 3.1 MIL/uL — ABNORMAL LOW (ref 3.87–5.11)
RDW: 15.3 % (ref 11.5–15.5)
WBC: 15 10*3/uL — ABNORMAL HIGH (ref 4.0–10.5)

## 2012-01-01 LAB — BASIC METABOLIC PANEL
BUN: 23 mg/dL (ref 6–23)
CO2: 25 mEq/L (ref 19–32)
Calcium: 8.2 mg/dL — ABNORMAL LOW (ref 8.4–10.5)
Chloride: 95 mEq/L — ABNORMAL LOW (ref 96–112)
Creatinine, Ser: 0.61 mg/dL (ref 0.50–1.10)
GFR calc Af Amer: >90 mL/min (ref >90)
GFR calc non Af Amer: 82 mL/min — ABNORMAL LOW (ref >90)
Glucose, Bld: 118 mg/dL — ABNORMAL HIGH (ref 70–99)
Potassium: 4.4 mEq/L (ref 3.5–5.1)
Sodium: 128 mEq/L — ABNORMAL LOW (ref 135–145)

## 2012-01-01 LAB — TISSUE CULTURE
Culture: NO GROWTH
Gram Stain: NONE SEEN

## 2012-01-01 LAB — GLUCOSE, CAPILLARY
Glucose-Capillary: 95 mg/dL (ref 70–99)
Glucose-Capillary: 113 mg/dL — ABNORMAL HIGH (ref 70–99)
Glucose-Capillary: 118 mg/dL — ABNORMAL HIGH (ref 70–99)

## 2012-01-01 MED ORDER — ROSUVASTATIN CALCIUM 10 MG PO TABS
10.0000 mg | ORAL_TABLET | Freq: Every day | ORAL | Status: DC
  Administered 2012-01-01 – 2012-01-06 (×5): 10 mg via ORAL
  Filled 2012-01-01 (×8): qty 1

## 2012-01-01 MED ORDER — ACETAMINOPHEN 325 MG PO TABS: 650.0000 mg | ORAL_TABLET | Freq: Four times a day (QID) | ORAL | Status: DC | PRN

## 2012-01-01 MED ORDER — FUROSEMIDE 40 MG PO TABS
40.0000 mg | ORAL_TABLET | Freq: Every day | ORAL | Status: AC
  Administered 2012-01-01 – 2012-01-03 (×3): 40 mg via ORAL
  Filled 2012-01-01 (×3): qty 1

## 2012-01-01 MED ORDER — PANTOPRAZOLE SODIUM 40 MG PO TBEC
40.0000 mg | DELAYED_RELEASE_TABLET | Freq: Every day | ORAL | Status: DC
  Administered 2012-01-01 – 2012-01-07 (×7): 40 mg via ORAL
  Filled 2012-01-01 (×8): qty 1

## 2012-01-01 MED ORDER — SODIUM CHLORIDE 0.9 % IV SOLN: 250.0000 mL | INTRAVENOUS | Status: DC | PRN

## 2012-01-01 MED ORDER — POVIDONE-IODINE 10 % EX SOLN
1.0000 "application " | Freq: Two times a day (BID) | CUTANEOUS | Status: DC
  Administered 2012-01-01 – 2012-01-07 (×13): 1 via TOPICAL
  Filled 2012-01-01: qty 15

## 2012-01-01 MED ORDER — BISACODYL 10 MG RE SUPP: 10.0000 mg | Freq: Every day | RECTAL | Status: DC | PRN

## 2012-01-01 MED ORDER — POTASSIUM CHLORIDE CRYS ER 20 MEQ PO TBCR
20.0000 meq | EXTENDED_RELEASE_TABLET | Freq: Every day | ORAL | Status: AC
  Administered 2012-01-01 – 2012-01-03 (×3): 20 meq via ORAL
  Filled 2012-01-01 (×3): qty 1

## 2012-01-01 MED ORDER — BISACODYL 5 MG PO TBEC
10.0000 mg | DELAYED_RELEASE_TABLET | Freq: Every day | ORAL | Status: DC | PRN
  Filled 2012-01-01: qty 2

## 2012-01-01 MED ORDER — ONDANSETRON HCL 4 MG PO TABS: 4.0000 mg | ORAL_TABLET | Freq: Four times a day (QID) | ORAL | Status: DC | PRN

## 2012-01-01 MED ORDER — MOVING RIGHT ALONG BOOK
Freq: Once | Status: AC
  Administered 2012-01-01: 12:00:00
  Filled 2012-01-01: qty 1

## 2012-01-01 MED ORDER — ASPIRIN EC 325 MG PO TBEC
325.0000 mg | DELAYED_RELEASE_TABLET | Freq: Every day | ORAL | Status: DC
  Administered 2012-01-01 – 2012-01-03 (×3): 325 mg via ORAL
  Filled 2012-01-01 (×3): qty 1

## 2012-01-01 MED ORDER — INSULIN ASPART 100 UNIT/ML ~~LOC~~ SOLN
0.0000 [IU] | Freq: Three times a day (TID) | SUBCUTANEOUS | Status: DC
  Administered 2012-01-02 – 2012-01-03 (×4): 2 [IU] via SUBCUTANEOUS
  Administered 2012-01-03: 4 [IU] via SUBCUTANEOUS
  Administered 2012-01-05 – 2012-01-06 (×2): 2 [IU] via SUBCUTANEOUS
  Filled 2012-01-01: qty 3

## 2012-01-01 MED ORDER — ONDANSETRON HCL 4 MG/2ML IJ SOLN: 4.0000 mg | Freq: Four times a day (QID) | INTRAMUSCULAR | Status: DC | PRN

## 2012-01-01 MED ORDER — SODIUM CHLORIDE 0.9 % IJ SOLN
3.0000 mL | Freq: Two times a day (BID) | INTRAMUSCULAR | Status: DC
  Administered 2012-01-01 – 2012-01-06 (×10): 3 mL via INTRAVENOUS

## 2012-01-01 MED ORDER — METOPROLOL TARTRATE 12.5 MG HALF TABLET
12.5000 mg | ORAL_TABLET | Freq: Two times a day (BID) | ORAL | Status: DC
  Administered 2012-01-01 (×2): 12.5 mg via ORAL
  Filled 2012-01-01 (×5): qty 1

## 2012-01-01 MED ORDER — DOCUSATE SODIUM 100 MG PO CAPS
200.0000 mg | ORAL_CAPSULE | Freq: Every day | ORAL | Status: DC
  Filled 2012-01-01: qty 2

## 2012-01-01 MED ORDER — DIPHENHYDRAMINE HCL 25 MG PO CAPS
25.0000 mg | ORAL_CAPSULE | Freq: Every evening | ORAL | Status: DC | PRN
  Administered 2012-01-02 – 2012-01-03 (×2): 25 mg via ORAL
  Filled 2012-01-01 (×2): qty 1

## 2012-01-01 MED ORDER — TRAMADOL HCL 50 MG PO TABS
50.0000 mg | ORAL_TABLET | ORAL | Status: DC | PRN
  Administered 2012-01-01 – 2012-01-07 (×2): 50 mg via ORAL
  Filled 2012-01-01: qty 1
  Filled 2012-01-01: qty 2
  Filled 2012-01-01: qty 1

## 2012-01-01 MED ORDER — SODIUM CHLORIDE 0.9 % IJ SOLN: 3.0000 mL | INTRAMUSCULAR | Status: DC | PRN

## 2012-01-01 NOTE — Progress Notes (Signed)
UR Completed.  Chelle Cayton Jane [phone] 01/01/2012  

## 2012-01-01 NOTE — Progress Notes (Signed)
Pt admitted from 2300 this afternoon. A/O x3, ambulates with one assist. Denies any pain/discomfort upon assessment. Pt sustained multiple bruises  on BLE, arms and breast. from surgery. Incisions on mid sternal and bilateral legs remains C/D/I. No acute distress indicated. Pt oriented to room and equipment. Will continue  to monitor closely.

## 2012-01-01 NOTE — Progress Notes (Signed)
Pt refused third walk and dressings on legs were changed.   Royalty Domagala [employer]

## 2012-01-01 NOTE — Progress Notes (Signed)
Patient ID: Carol [name], female   DOB: 07/04/1928, 78 y.o.   MRN: [id number] TCTS DAILY PROGRESS NOTE                   [address]            [place] [address]          [phone]      4 Days Post-Op Procedure(s) (LRB): AORTIC VALVE REPLACEMENT (AVR) (N/A) CORONARY ARTERY BYPASS GRAFTING (CABG) (N/A)  Total Length of Stay:  LOS: 6 days   Subjective: Feeling better, walked in unit yesterday not yet today  Objective: Vital signs in last 24 hours: Patient Vitals for the past 24 hrs:  BP Temp Temp src Pulse Resp SpO2 Weight  01/01/12 0738 - 98 F (36.7 C) Oral - - - -  01/01/12 0700 173/123 mmHg - - 65  19  95 % -  01/01/12 0600 113/58 mmHg - - 38  24  98 % -  01/01/12 0500 102/46 mmHg - - 91  15  98 % -  01/01/12 0400 156/78 mmHg - - 104  24  98 % -  01/01/12 0300 - 98.3 F (36.8 C) Oral 117  21  96 % -  01/01/12 0200 105/73 mmHg - - 99  20  94 % -  01/01/12 0131 - - - - - - 173 lb 1 oz (78.5 kg)  01/01/12 0100 127/37 mmHg - - 85  31  96 % -  01/01/12 0030 - 97.7 F (36.5 C) Oral - - - -  01/01/12 0000 123/54 mmHg - - 95  24  97 % -  12/31/11 2300 141/128 mmHg - - 110  21  98 % -  12/31/11 2200 - - - - 33  100 % -  12/31/11 2100 79/60 mmHg - - 89  25  99 % -  12/31/11 2000 135/96 mmHg - - - 24  100 % -  12/31/11 1930 - 97.5 F (36.4 C) Oral - - - -  12/31/11 1900 108/66 mmHg - - 105  23  94 % -  12/31/11 1830 115/89 mmHg - - 60  21  96 % -  12/31/11 1800 114/95 mmHg - - 103  21  95 % -  12/31/11 1730 141/92 mmHg - - 50  21  94 % -  12/31/11 1700 114/82 mmHg - - 76  29  99 % -  12/31/11 1630 122/87 mmHg - - 57  22  100 % -  12/31/11 1613 - 97.9 F (36.6 C) Oral - - - -  12/31/11 1600 - - - 65  16  79 % -  12/31/11 1530 102/59 mmHg - - - 22  - -  12/31/11 1500 97/77 mmHg - - - 20  - -  12/31/11 1419 - - - - - 99 % -  12/31/11 1400 109/68 mmHg - - 91  25  99 % -  12/31/11 1300 116/69 mmHg - - 91  20  98 % -  12/31/11 1209 - 97.7 F (36.5  C) Oral - - - -  12/31/11 1200 90/40 mmHg - - 63  21  99 % -  12/31/11 1100 116/65 mmHg - - 82  25  95 % -  12/31/11 1000 - - - 53  20  99 % -  12/31/11 0900 110/62 mmHg - - 52  23  99 % -  12/31/11 0800 144/48 mmHg - -  52  18  98 % -   Wt Readings from Last 3 Encounters:  01/01/12 173 lb 1 oz (78.5 kg)  01/01/12 173 lb 1 oz (78.5 kg)  01/01/12 173 lb 1 oz (78.5 kg)   Weight change: 3.5 oz (0.1 kg)       Intake/Output from previous day: 01/07 0701 - 01/08 0700 In: 1092 [P.O.:710; I.V.:380; IV Piggyback:2] Out: 865 [Urine:865]      Current Meds: Scheduled Meds:   . acetaminophen  1,000 mg Oral Q6H   Or  . acetaminophen (TYLENOL) oral liquid 160 mg/5 mL  975 mg Per Tube Q6H  . aspirin EC  325 mg Oral Daily   Or  . aspirin  324 mg Per Tube Daily  . bisacodyl  10 mg Oral Daily   Or  . bisacodyl  10 mg Rectal Daily  . cholecalciferol  400 Units Oral Daily  . docusate sodium  200 mg Oral Daily  . furosemide  20 mg Intravenous Once  . gabapentin  300 mg Oral TID  . levothyroxine  75 mcg Oral Daily  . metoprolol tartrate  12.5 mg Oral BID  . mulitivitamin with minerals  1 tablet Oral Daily  . pantoprazole  40 mg Oral Q1200  . rosuvastatin  20 mg Oral QHS  . sodium chloride  3 mL Intravenous Q12H  . vitamin C  500 mg Oral Daily   Continuous Infusions:   . sodium chloride 20 mL/hr at 12/29/11 0600  . sodium chloride    . sodium chloride    . lactated ringers 20 mL/hr at 12/29/11 0600  . DISCONTD: phenylephrine (NEO-SYNEPHRINE) Adult infusion Stopped (12/30/11 1900)   PRN Meds:.alum & mag hydroxide-simeth, diphenhydrAMINE, ondansetron (ZOFRAN) IV, oxyCODONE, sodium chloride, DISCONTD: metoprolol, DISCONTD: morphine, DISCONTD: zolpidem  General appearance: alert and cooperative Neurologic: intact Heart: regular rate and rhythm, S1, S2 normal, no murmur, click, rub or gallop Lungs: clear to auscultation bilaterally Abdomen: soft, non-tender; bowel sounds normal; no  masses,  no organomegaly Extremities: extremities normal, atraumatic, no cyanosis or edema and Homans sign is negative, no sign of DVT Wound: sternum intact, leg wound intact  Lab Results: CBC: Basename 01/01/12 0530 12/31/11 0500  WBC 15.0* 10.1  HGB 9.3* 8.6*  HCT 27.1* 25.2*  PLT 142* 85*   BMET:  Basename 01/01/12 0530 12/31/11 0500  NA 128* 130*  K 4.4 4.2  CL 95* 97  CO2 25 27  GLUCOSE 118* 111*  BUN 23 23  CREATININE 0.61 0.64  CALCIUM 8.2* 8.1*    PT/INR: No results found for this basename: LABPROT,INR in the last 72 hours Radiology: Dg Chest Port 1 View  12/31/2011  *RADIOLOGY REPORT*  Clinical Data: Atelectasis and effusions.  PORTABLE CHEST - 1 VIEW  Comparison: 01/06 and 12/29/2011  Findings: No pneumothorax. Sheath remains in the superior vena cava.  Improving slight atelectasis in the left upper lobe.  Slightly increased small right effusion.  Small left effusion with slight atelectasis at the base, unchanged.  Persistent chronic cardiomegaly.  Vascularity is normal.  IMPRESSION:  1.  Improving atelectasis in the left upper lung zone. 2.  Slight increase in small right effusion.  Original Report Authenticated By: Gwynn Burly, M.D.     Assessment/Plan: S/P Procedure(s) (LRB): AORTIC VALVE REPLACEMENT (AVR) (N/A) CORONARY ARTERY BYPASS GRAFTING (CABG) (N/A) Plan for transfer to step-down: see transfer orders See progression orders Plts increasing Diuresis To circle      Delight Ovens MD  Beeper [phone]  Office [phone] 01/01/2012 7:53 AM

## 2012-01-01 NOTE — Progress Notes (Signed)
CARDIAC REHAB PHASE I   PRE:  Rate/Rhythm: 101 ST PVCS and PACs  BP:  Supine:   Sitting: 103/85  Standing:    SaO2: 96%2L  MODE:  Ambulation: 150 ft   POST:  Rate/Rhythem: 117ST PACs/PVCs  BP:  Supine: 124/73  Sitting:   Standing:    SaO2: 97%2L 1410-1450 Pt walked 150 ft on O2 at 2L with rolling walker and asst x 2. Followed with chair and had to sit down once to rest. Tired by end of walk. To BSC and then to bed. Left on 2L .   Duanne Limerick

## 2012-01-02 ENCOUNTER — Other Ambulatory Visit: Payer: Self-pay

## 2012-01-02 ENCOUNTER — Inpatient Hospital Stay (HOSPITAL_COMMUNITY): Payer: Medicare Other

## 2012-01-02 DIAGNOSIS — J90 Pleural effusion, not elsewhere classified: Secondary | ICD-10-CM | POA: Diagnosis not present

## 2012-01-02 DIAGNOSIS — J9819 Other pulmonary collapse: Secondary | ICD-10-CM | POA: Diagnosis not present

## 2012-01-02 DIAGNOSIS — I2510 Atherosclerotic heart disease of native coronary artery without angina pectoris: Secondary | ICD-10-CM | POA: Diagnosis not present

## 2012-01-02 DIAGNOSIS — R918 Other nonspecific abnormal finding of lung field: Secondary | ICD-10-CM | POA: Diagnosis not present

## 2012-01-02 LAB — CBC
HCT: 29.4 % — ABNORMAL LOW (ref 36.0–46.0)
Hemoglobin: 9.9 g/dL — ABNORMAL LOW (ref 12.0–15.0)
MCH: 29.7 pg (ref 26.0–34.0)
MCHC: 33.7 g/dL (ref 30.0–36.0)
MCV: 88.3 fL (ref 78.0–100.0)
Platelets: 169 10*3/uL (ref 150–400)
RBC: 3.33 MIL/uL — ABNORMAL LOW (ref 3.87–5.11)
RDW: 15.7 % — ABNORMAL HIGH (ref 11.5–15.5)
WBC: 14.6 10*3/uL — ABNORMAL HIGH (ref 4.0–10.5)

## 2012-01-02 LAB — BASIC METABOLIC PANEL
BUN: 22 mg/dL (ref 6–23)
CO2: 24 mEq/L (ref 19–32)
Calcium: 8.5 mg/dL (ref 8.4–10.5)
Chloride: 93 mEq/L — ABNORMAL LOW (ref 96–112)
Creatinine, Ser: 0.58 mg/dL (ref 0.50–1.10)
GFR calc Af Amer: >90 mL/min (ref >90)
GFR calc non Af Amer: 84 mL/min — ABNORMAL LOW (ref >90)
Glucose, Bld: 118 mg/dL — ABNORMAL HIGH (ref 70–99)
Potassium: 4.3 mEq/L (ref 3.5–5.1)
Sodium: 130 mEq/L — ABNORMAL LOW (ref 135–145)

## 2012-01-02 LAB — GLUCOSE, CAPILLARY
Glucose-Capillary: 121 mg/dL — ABNORMAL HIGH (ref 70–99)
Glucose-Capillary: 109 mg/dL — ABNORMAL HIGH (ref 70–99)
Glucose-Capillary: 112 mg/dL — ABNORMAL HIGH (ref 70–99)
Glucose-Capillary: 125 mg/dL — ABNORMAL HIGH (ref 70–99)

## 2012-01-02 MED ORDER — AMIODARONE HCL 200 MG PO TABS
400.0000 mg | ORAL_TABLET | Freq: Two times a day (BID) | ORAL | Status: DC
  Administered 2012-01-02 – 2012-01-07 (×11): 400 mg via ORAL
  Filled 2012-01-02 (×13): qty 2

## 2012-01-02 MED ORDER — LEVOTHYROXINE SODIUM 75 MCG PO TABS
75.0000 ug | ORAL_TABLET | Freq: Every day | ORAL | Status: DC
  Administered 2012-01-03 – 2012-01-07 (×5): 75 ug via ORAL
  Filled 2012-01-02 (×7): qty 1

## 2012-01-02 MED ORDER — AMIODARONE HCL 200 MG PO TABS
400.0000 mg | ORAL_TABLET | Freq: Once | ORAL | Status: AC
  Administered 2012-01-02: 400 mg via ORAL

## 2012-01-02 MED ORDER — METOPROLOL TARTRATE 25 MG PO TABS
25.0000 mg | ORAL_TABLET | Freq: Two times a day (BID) | ORAL | Status: DC
  Administered 2012-01-02 – 2012-01-04 (×6): 25 mg via ORAL
  Filled 2012-01-02 (×8): qty 1

## 2012-01-02 MED ORDER — AMIODARONE IV BOLUS ONLY 150 MG/100ML
150.0000 mg | Freq: Once | INTRAVENOUS | Status: AC
  Administered 2012-01-02: 150 mg via INTRAVENOUS
  Filled 2012-01-02: qty 100

## 2012-01-02 NOTE — Progress Notes (Signed)
CARDIAC REHAB PHASE I   PRE:  Rate/Rhythm: 85SR  BP:  Supine:   Sitting: 105/54  Standing:    SaO2: 100%3L  MODE:  Ambulation: 150 ft   POST:  Rate/Rhythem: 107  BP:  Supine:   Sitting: 153/85  Standing:    SaO2: 98%2L 1442-1510 Pt walked 150 ft on O2 at 2L with rolling walker and asst x 2. Did not have to sit to rest. Took a few standing rest breaks. Was tired from [facility] trip prior to walk so unable to increase distance. To chair with call bell. Easily fatigued.  Duanne Limerick

## 2012-01-02 NOTE — Progress Notes (Signed)
Physical Therapy Treatment Patient Details Name: Carol [name] MRN: [id number] DOB: 24-Jun-1928 Today's Date: 01/02/2012  PT Assessment/Plan  PT - Assessment/Plan Comments on Treatment Session: pt presents s/p CABG and AVR.  pt fatigues quickly and needs encouragement for ambulation.   PT Plan: Discharge plan remains appropriate PT Frequency: Min 3X/week Recommendations for Other Services: OT consult Follow Up Recommendations: Home health PT;Supervision/Assistance - 24 hour Equipment Recommended: None recommended by PT PT Goals  Acute Rehab PT Goals PT Goal: Rolling Supine to Right Side - Progress: Progressing toward goal PT Goal: Rolling Supine to Left Side - Progress: Progressing toward goal PT Goal: Supine/Side to Sit - Progress: Progressing toward goal PT Goal: Sit to Supine/Side - Progress: Progressing toward goal PT Goal: Sit to Stand - Progress: Progressing toward goal PT Goal: Stand to Sit - Progress: Progressing toward goal PT Transfer Goal: Bed to Chair/Chair to Bed - Progress: Progressing toward goal PT Goal: Ambulate - Progress: Progressing toward goal PT Goal: Up/Down Stairs - Progress: Progressing toward goal Additional Goals PT Goal: Additional Goal #1 - Progress: Progressing toward goal  PT Treatment Precautions/Restrictions  Precautions Precautions: Sternal Restrictions Weight Bearing Restrictions: No Mobility (including Balance) Bed Mobility Bed Mobility: Yes Supine to Sit: 4: Min assist Supine to Sit Details (indicate cue type and reason): cues for sternal precautions Sit to Supine: 4: Min assist Sit to Supine - Details (indicate cue type and reason): A with Bil LEs Transfers Transfers: Yes Sit to Stand: 4: Min assist;With upper extremity assist Sit to Stand Details (indicate cue type and reason): cues for use of UEs and sternal precautions. Stand to Sit: 4: Min assist;With upper extremity assist Stand to Sit Details: cues for UE positioning on knees,  control descent, sternal precautions.   Ambulation/Gait Ambulation/Gait: Yes Ambulation/Gait Assistance: 4: Min assist Ambulation/Gait Assistance Details (indicate cue type and reason): cues for upright posture, decrease WBing on UEs, encouragement Ambulation Distance (Feet): 150 Feet Assistive device: Rolling walker Gait Pattern: Shuffle;Trunk flexed    Exercise    End of Session PT - End of Session Equipment Utilized During Treatment: Gait belt Activity Tolerance: Patient limited by fatigue Patient left: in bed;with call bell in reach Nurse Communication: Mobility status for transfers;Mobility status for ambulation General Behavior During Session: [facility] for tasks performed Cognition: [facility] for tasks performed  Sunny Schlein, [place] [phone] 01/02/2012, 12:46 PM

## 2012-01-02 NOTE — Progress Notes (Signed)
   CARE MANAGEMENT NOTE 01/02/2012  Patient:  Carol [name], Carol [name]   Account Number:  [id number]  Date Initiated:  01/01/2012  Documentation initiated by:  Avie Arenas  Subjective/Objective Assessment:   Post op cardiac surgery on 12-28-11.  Has Spouse     Action/Plan:   PTA, PT INDEPENDENT, LIVES WITH SPOUSE.   Anticipated DC Date:  01/03/2012   Anticipated DC Plan:  HOME W HOME HEALTH SERVICES      DC Planning Services  CM consult      [facility] Choice  HOME HEALTH   Choice offered to / List presented to:  C-1 Patient        HH arranged  HH-1 RN  HH-2 PT  HH-4 NURSE'S AIDE      HH agency  [facility]   Status of service:  In process, will continue to follow Medicare Important Message given?   (If response is "NO", the following Medicare IM given date fields will be blank) Date Medicare IM given:   Date Additional Medicare IM given:    Discharge Disposition:  HOME W HOME HEALTH SERVICES  Per UR Regulation:  Reviewed for med. necessity/level of care/duration of stay  Comments:  01/02/12 Tremane Spurgeon,RN,BSN 1410 MET WITH PT AND HUSBAND TO DISCUSS DC PLANS.  HUSBAND TO PROVIDE 24HR CARE, AND DAUGHTER COMING IN FROM [place] ON SATURDAY FOR ONE WEEK.  WILL NEED HOME HEALTH FOLLOW UP AT DC.  RECOMMEND HHRN FOR RESTORATIVE CARE, HHPT, AND HH AIDE.  PT HAS RW, BSC, AND WHEELCHAIR, IF NEEDED AT HOME. REFERRAL TO [facility] WITH [facility] FOR HOME HEALTH NEEDS. Phone #[phone]   01-01-12 1:55pm Johny Shears - [phone] UR completed.

## 2012-01-02 NOTE — Progress Notes (Signed)
Pt ambulated 150 feet on 2L of oxygen. Pt had to rest once during the walk, complained of feeling tired. Pt is now resting in the bed.  Carol [name]

## 2012-01-02 NOTE — Progress Notes (Signed)
Pt converted into atrial flutter, the pt remained in that rhythm for a few minutes and then converted back into a sinus rhythm, which was also revealed on the EKG. Dr Laneta Simmers was notified , he ordered a 150 mg bolus of Amiodarone as well as 400 mg BID PO of Amiodarone.    Briella Hobday [employer]

## 2012-01-02 NOTE — Progress Notes (Signed)
Subjective:  Had a flutter last night and again today. On amiodarone.  Developed diarrhea yesterday and some recurrence today. Not SOB  Objective:  Vital Signs in the last 24 hours: BP 157/70  Pulse 102  Temp(Src) 97.5 F (36.4 C) (Oral)  Resp 20  Ht 5\' 4"  (1.626 m)  Wt 80.6 kg (177 lb 11.1 oz)  BMI 30.50 kg/m2  SpO2 99%  Physical Exam: Pleasant WF in NAD, alert oriented Lungs: Reduced breath sounds Cardiac:  Regular rhythm, normal S1 and S2, no S3, soft 1/6 murmur Abdomen:  Soft, nontender, no masses Extremities:  2+ edema present  Intake/Output from previous day: 01/08 0701 - 01/09 0700 In: 720 [P.O.:720] Out: 1953 [Urine:1950; Stool:3]  Weight change: 2.1 kg (4 lb 10.1 oz)  Lab Results: Basic Metabolic Panel:  Basename 01/02/12 0645 01/01/12 0530  NA 130* 128*  K 4.3 4.4  CL 93* 95*  CO2 24 25  GLUCOSE 118* 118*  BUN 22 23  CREATININE 0.58 0.61    CBC:  Basename 01/02/12 0645 01/01/12 0530  WBC 14.6* 15.0*  NEUTROABS -- --  HGB 9.9* 9.3*  HCT 29.4* 27.1*  MCV 88.3 87.4  PLT 169 142*    PROTIME: Lab Results  Component Value Date   INR 1.83* 12/28/2011   INR 1.00 12/27/2011   INR 0.98 01/20/2011    Telemetry: Reviewed , a flutter currently vent rate 133  Assessment/Plan:  1. Recent AVR/CABG 2. Blood loss anemia 3. New onset a flutter 4. Diarrhea  REC:  Give amiodarone now. Watch diarrhea.   Darden Palmer.  MD [facility] 01/02/2012, 1:32 PM

## 2012-01-02 NOTE — Progress Notes (Addendum)
5 Days Post-Op Procedure(s) (LRB): AORTIC VALVE REPLACEMENT (AVR) (N/A) CORONARY ARTERY BYPASS GRAFTING (CABG) (N/A)  Subjective: Patient with previous loose stools. Feels fatigued.  Objective: Vital signs in last 24 hours: Patient Vitals for the past 24 hrs:  BP Temp Temp src Pulse Resp SpO2 Weight  01/02/12 0626 140/69 mmHg - - - - - -  01/02/12 0417 135/111 mmHg 97.5 F (36.4 C) Oral 51  20  99 % -  01/02/12 0342 - - - - - - 177 lb 11.1 oz (80.6 kg)  01/01/12 2224 134/58 mmHg - - 101  - - -  01/01/12 2146 - 96.9 F (36.1 C) Oral 99  16  100 % -  01/01/12 1342 126/60 mmHg 97.6 F (36.4 C) Oral 72  21  98 % -  01/01/12 1130 110/78 mmHg - Oral 76  22  98 % -  01/01/12 0940 132/63 mmHg - - - 26  - -  01/01/12 0800 133/63 mmHg - - - 19  95 % -   Pre op weight  66.7 kg Current Weight  01/02/12 177 lb 11.1 oz (80.6 kg)      Intake/Output from previous day: 01/08 0701 - 01/09 0700 In: 720 [P.O.:720] Out: 1953 [Urine:1950; Stool:3]   Physical Exam:  Cardiovascular: IRRR IRRR, no murmurs, gallops, or rubs. Pulmonary: Slightly diminished at the bases; no rales, wheezes, or rhonchi. Abdomen: Soft, non tender, bowel sounds present. Extremities: Bilateral lower extremity edema. Wounds: Sternum clean and dry.  No erythema or signs of infection. Ecchymosis b/l and serosanginous ooze from incisions.  Lab Results: CBC: Basename 01/02/12 0645 01/01/12 0530  WBC 14.6* 15.0*  HGB 9.9* 9.3*  HCT 29.4* 27.1*  PLT 169 142*   BMET:  Basename 01/01/12 0530 12/31/11 0500  NA 128* 130*  K 4.4 4.2  CL 95* 97  CO2 25 27  GLUCOSE 118* 111*  BUN 23 23  CREATININE 0.61 0.64  CALCIUM 8.2* 8.1*    PT/INR: No results found for this basename: LABPROT,INR in the last 72 hours ABG:  INR: Will add last result for INR, ABG once components are confirmed Will add last 4 CBG results once components are confirmed  Assessment/Plan:  1. CV - Aflutter/fib since 1 am. Converted to SR around  5:30 am, but appears to be intermittently in and out of afib.Continue Amiodarone 400 bid. Will increase Lopressor to 25 bid. 2.  Pulmonary - CXR this am shows improvement in aeration, no pneumothorax, bibasilar atx and small pleural effusions (L>R).Encourage incentive spirometer. Wean O2 as tolerates. 3. Volume Overload - Continue with diuresis. May need additional Lasix. 4.  Acute blood loss anemia - H/H stable at 9.9/29.4. 5.Leukocytosis-WBC slightly decreased from 15 to 14.6. Remains afebrile, no signs of wound infection. 6.Will restart Levothyroxine as taken pre op and check TSH. 7.Await BMET results. 8.Stop stool softener secondary to loose stools.   ZIMMERMAN,DONIELLE MPA-C 01/02/2012    I have seen and examined Carol [name] and agree with the above assessment  and plan.  Delight Ovens MD Beeper [phone] Office [phone] 01/02/2012 3:57 PM

## 2012-01-03 DIAGNOSIS — I2510 Atherosclerotic heart disease of native coronary artery without angina pectoris: Secondary | ICD-10-CM | POA: Diagnosis not present

## 2012-01-03 DIAGNOSIS — I4891 Unspecified atrial fibrillation: Secondary | ICD-10-CM | POA: Diagnosis not present

## 2012-01-03 DIAGNOSIS — I359 Nonrheumatic aortic valve disorder, unspecified: Secondary | ICD-10-CM | POA: Diagnosis not present

## 2012-01-03 LAB — GLUCOSE, CAPILLARY
Glucose-Capillary: 86 mg/dL (ref 70–99)
Glucose-Capillary: 125 mg/dL — ABNORMAL HIGH (ref 70–99)
Glucose-Capillary: 133 mg/dL — ABNORMAL HIGH (ref 70–99)
Glucose-Capillary: 113 mg/dL — ABNORMAL HIGH (ref 70–99)

## 2012-01-03 LAB — TSH: TSH: 2.893 u[IU]/mL (ref 0.350–4.500)

## 2012-01-03 MED ORDER — ASPIRIN EC 81 MG PO TBEC
81.0000 mg | DELAYED_RELEASE_TABLET | Freq: Every day | ORAL | Status: DC
  Administered 2012-01-03 – 2012-01-06 (×4): 81 mg via ORAL
  Filled 2012-01-03 (×5): qty 1

## 2012-01-03 MED ORDER — FUROSEMIDE 40 MG PO TABS
40.0000 mg | ORAL_TABLET | Freq: Every day | ORAL | Status: DC
  Filled 2012-01-03: qty 1

## 2012-01-03 MED ORDER — COUMADIN BOOK
Freq: Once | Status: AC
  Administered 2012-01-03: 13:00:00
  Filled 2012-01-03: qty 1

## 2012-01-03 MED ORDER — FOLIC ACID 1 MG PO TABS
1.0000 mg | ORAL_TABLET | Freq: Every day | ORAL | Status: DC
  Administered 2012-01-03 – 2012-01-07 (×5): 1 mg via ORAL
  Filled 2012-01-03 (×5): qty 1

## 2012-01-03 MED ORDER — WARFARIN SODIUM 4 MG PO TABS
4.0000 mg | ORAL_TABLET | Freq: Once | ORAL | Status: AC
  Filled 2012-01-03: qty 1

## 2012-01-03 MED ORDER — POTASSIUM CHLORIDE CRYS ER 20 MEQ PO TBCR
20.0000 meq | EXTENDED_RELEASE_TABLET | Freq: Every day | ORAL | Status: DC
  Filled 2012-01-03: qty 1

## 2012-01-03 MED ORDER — WARFARIN VIDEO
Freq: Once | Status: AC
  Administered 2012-01-03: 13:00:00

## 2012-01-03 NOTE — Progress Notes (Signed)
Subjective:  Less diarrhea today.  Appetite not good.  Currently in NSR with PVC's.  A flutter is settling down.  Objective:  Vital Signs in the last 24 hours: BP 103/63  Pulse 88  Temp(Src) 97.5 F (36.4 C) (Oral)  Resp 18  Ht 5\' 4"  (1.626 m)  Wt 76.3 kg (168 lb 3.4 oz)  BMI 28.87 kg/m2  SpO2 100%  Physical Exam: Pleasant WF in NAD, alert oriented Lungs: Reduced breath sounds Cardiac:  Regular rhythm, normal S1 and S2, no S3, soft 1/6 murmur Abdomen:  Soft, nontender, no masses Extremities:  1+ edema present  Intake/Output from previous day: 01/09 0701 - 01/10 0700 In: 480 [P.O.:480] Out: 827 [Urine:825; Stool:2]  Weight change: -4.3 kg (-9 lb 7.7 oz)  Lab Results: Basic Metabolic Panel:  Basename 01/02/12 0645 01/01/12 0530  NA 130* 128*  K 4.3 4.4  CL 93* 95*  CO2 24 25  GLUCOSE 118* 118*  BUN 22 23  CREATININE 0.58 0.61    CBC:  Basename 01/02/12 0645 01/01/12 0530  WBC 14.6* 15.0*  NEUTROABS -- --  HGB 9.9* 9.3*  HCT 29.4* 27.1*  MCV 88.3 87.4  PLT 169 142*    PROTIME: Lab Results  Component Value Date   INR 1.83* 12/28/2011   INR 1.00 12/27/2011   INR 0.98 01/20/2011    Telemetry: Reviewed ,sinus rhythm with PVC's.   Assessment/Plan:  1. Recent AVR/CABG 2. Blood loss anemia 3. A fluttter improved 4. Diarrhea better  REC:  Warfarin for a few months following AVR as well as a flutter.  Ask pharmacy to dose.   Carol [name].  MD [facility] 01/03/2012, 8:55 AM

## 2012-01-03 NOTE — Progress Notes (Signed)
Dressing changes done to legs, copious amount of serosang thin watery fluid from incision sites, "weeping", new dressings applied with betadine.  Will continue to monitor. Ave Filter

## 2012-01-03 NOTE — Progress Notes (Signed)
CARDIAC REHAB PHASE I   PRE:  Rate/Rhythm: 94SR with PVCs  BP:  Supine:   Sitting: 110/60  Standing:    SaO2: 94%RA  MODE:  Ambulation: 50 ft   POST:  Rate/Rhythem:  Incontinent of urine in hall and left leg dripping past dressing. To BSC and then cleaned pt.  BP:  Supine:   Sitting:   Standing:    SaO2:  1435-1505 Pt ready to walk on RA with rollling walker and asst x 2. Had to cut walk short due to incontinence of urine and drainage from left leg. To [facility] for pt to void. Changed gown and cleaned pt. Put panties and pad on for next walk. Assisted to chair for RN to change leg dressing. Duanne Limerick

## 2012-01-03 NOTE — Progress Notes (Signed)
Occupational Therapy Evaluation Patient Details Name: Carol [name] MRN: [id number] DOB: 1928/08/23 Today's Date: 01/03/2012  Problem List:  Patient Active Problem List  Diagnoses  . Pulmonary nodule  . Aortic stenosis  . Hypertensive heart disease without CHF  . Hyperlipidemia  . Hypothyroidism  . Lumbar degenerative disc disease  . CAD (coronary artery disease)  . History of Villous adenoma of rectum  . S/P AVR and CABG    Past Medical History:  Past Medical History  Diagnosis Date  . Hyperlipidemia   . Hypothyroidism   . Spinal stenosis   . Lumbar disc disease   . Obesity   . Aortic stenosis   . Dyspnea   . Lung nodules     Sees Wert, thought to be benign  . Hypertensive heart disease without CHF   . Villous adenoma of rectum   . Coronary artery disease     Cath 1/13 left main   Past Surgical History:  Past Surgical History  Procedure Date  . Partial colectomy 2008    Villous adenoma  . Hernia repair   . Lumbar laminectomy   . Cholecystectomy   . Cardiac catheterization 2013  . Aortic valve replacement 12/28/2011    Procedure: AORTIC VALVE REPLACEMENT (AVR);  Surgeon: Delight Ovens, MD;  Location: [facility] OR;  Service: Open Heart Surgery;  Laterality: N/A;  . Coronary artery bypass graft 12/28/2011    Procedure: CORONARY ARTERY BYPASS GRAFTING (CABG);  Surgeon: Delight Ovens, MD;  Location: [facility] OR;  Service: Open Heart Surgery;  Laterality: N/A;    OT Assessment/Plan/Recommendation OT Assessment Clinical Impression Statement: Patient admitted for CABG and AVR and presents with below problem list. Patient will benefit from skilled OT in the acute setting to maximize I with ADL and ADL to level set in goals so patient can safely d/c home with family (dtr is coming for 1 week at patient d/c and husband is available 24 hours but is elderly).  OT Recommendation/Assessment: Patient will need skilled OT in the acute care venue OT Problem List: Decreased  strength;Decreased activity tolerance;Decreased knowledge of use of DME or AE;Decreased knowledge of precautions OT Therapy Diagnosis : Generalized weakness OT Plan OT Frequency: Min 2X/week OT Treatment/Interventions: Self-care/ADL training;Therapeutic exercise;Energy conservation;DME and/or AE instruction;Therapeutic activities;Patient/family education OT Recommendation Follow Up Recommendations: Home health OT Equipment Recommended:  (TBD- unsure if patient owns 3n1) Individuals Consulted Consulted and Agree with Results and Recommendations: Patient OT Goals Acute Rehab OT Goals OT Goal Formulation: With patient Time For Goal Achievement: 7 days ADL Goals Pt Will Perform Grooming: Independently;Standing at sink ADL Goal: Grooming - Progress: Not met Pt Will Perform Upper Body Bathing: with supervision;Sitting in shower ADL Goal: Upper Body Bathing - Progress: Not met Pt Will Perform Lower Body Bathing: with modified independence;Sit to stand in shower ADL Goal: Lower Body Bathing - Progress: Not met Pt Will Perform Upper Body Dressing: with modified independence;Sitting, bed (Mod I with retrieval and maintaining sternal precautions) ADL Goal: Upper Body Dressing - Progress: Not met Pt Will Perform Lower Body Dressing: with modified independence;Sit to stand from bed;with adaptive equipment ADL Goal: Lower Body Dressing - Progress: Not met Pt Will Transfer to Toilet: with modified independence;Ambulation;with DME;3-in-1 ADL Goal: Toilet Transfer - Progress: Not met Pt Will Perform Toileting - Clothing Manipulation: Independently;Standing ADL Goal: Toileting - Clothing Manipulation - Progress: Not met Pt Will Perform Toileting - Hygiene: Independently;Standing at 3-in-1/toilet ADL Goal: Toileting - Hygiene - Progress: Not met Pt Will Perform Tub/Shower  Transfer: Shower transfer;with min assist;Ambulation;Shower seat without back ADL Goal: Web designer - Progress: Not  met Additional ADL Goal #1: Patient will independently verbalize and generalize 5/5 energy conservation techniques in prep for ADLs. ADL Goal: Additional Goal #1 - Progress: Not met  OT Evaluation Precautions/Restrictions  Precautions Precautions: Sternal Precaution Comments: Patient unable to recall sternal precautions. Educated patient on precautions and encouraged generalization of these to all activities. Restrictions Weight Bearing Restrictions: No Prior Functioning Home Living Lives With: Spouse Receives Help From: Family (daughter to come for a week once she d/c's) Type of Home: House Home Layout: One level Home Access: Stairs to enter [employer] of Steps: 1 Bathroom Shower/Tub: Health visitor: Standard Home Adaptive Equipment: Environmental consultant - rolling;Built-in shower seat;Grab bars in shower Prior Function Level of Independence: Independent with basic ADLs;Independent with homemaking with ambulation;Independent with gait;Independent with transfers Comments: daughter to come stay with patient for the first week once she goes home. Husband walks with a cane. ADL ADL Eating/Feeding: Performed;Independent Where Assessed - Eating/Feeding: Chair Grooming: Performed;Wash/dry hands;Supervision/safety Where Assessed - Grooming: Standing at sink Upper Body Bathing: Not assessed Lower Body Bathing: Performed;Minimal assistance Where Assessed - Lower Body Bathing: Sit to stand from chair Upper Body Dressing: Not assessed Lower Body Dressing: Performed;Moderate assistance Lower Body Dressing Details (indicate cue type and reason): assist needed to thread bilateral LE's as well as start to bring up legs to where patient could reach and finishing donning Where Assessed - Lower Body Dressing: Sit to stand from chair Toilet Transfer: Performed;Supervision/safety Toilet Transfer Method: Ambulating (with RW) Toilet Transfer Equipment: Raised toilet seat with arms (or  3-in-1 over toilet) Toileting - Clothing Manipulation: Performed;Supervision/safety Toileting - Clothing Manipulation Details (indicate cue type and reason): encouragement needed for patient to perform on her own Where Assessed - Glass blower/designer Manipulation: Standing Toileting - Hygiene: Performed;Minimal assistance Where Assessed - Toileting Hygiene: Standing Tub/Shower Transfer: Not assessed Equipment Used: Rolling walker Ambulation Related to ADLs: Patient supervision with RW ambulation. Easily out of breath with activity Vision/Perception  Vision - Assessment Vision Assessment: Vision not tested Sensation/Coordination Coordination Gross Motor Movements are Fluid and Coordinated: Yes Fine Motor Movements are Fluid and Coordinated: Yes Extremity Assessment RUE Assessment RUE Assessment: Within Functional Limits LUE Assessment LUE Assessment: Within Functional Limits Mobility  Bed Mobility Bed Mobility: No End of Session OT - End of Session Activity Tolerance: Patient limited by fatigue Patient left: in chair;with call bell in reach General Behavior During Session: [facility] for tasks performed Cognition: [facility] for tasks performed   Terrelle Ruffolo 01/03/2012, 10:55 AM

## 2012-01-03 NOTE — Progress Notes (Addendum)
6 Days Post-Op Procedure(s) (LRB): AORTIC VALVE REPLACEMENT (AVR) (N/A) CORONARY ARTERY BYPASS GRAFTING (CABG) (N/A)  Subjective: Patient just finished sponge bath. Decreasing loose stools.  Objective: Vital signs in last 24 hours: Patient Vitals for the past 24 hrs:  BP Temp Temp src Pulse Resp SpO2 Weight  01/03/12 0634 103/63 mmHg 97.5 F (36.4 C) Oral 88  18  100 % 168 lb 3.4 oz (76.3 kg)  01/02/12 1944 124/65 mmHg 97.5 F (36.4 C) Oral 128  24  98 % -  01/02/12 1839 - - - - - 92 % -  01/02/12 1408 127/73 mmHg 98.1 F (36.7 C) Oral 84  19  100 % -  01/02/12 1050 157/70 mmHg - - 102  - - -   Pre op weight  66.7 kg Current Weight  01/03/12 168 lb 3.4 oz (76.3 kg)      Intake/Output from previous day: 01/09 0701 - 01/10 0700 In: 480 [P.O.:480] Out: 827 [Urine:825; Stool:2]   Physical Exam:  Cardiovascular: Crecencio Mc;Soft Grade I systolic murmur; No gallops, or rubs. Pulmonary: Slightly diminished at the bases; no rales, wheezes, or rhonchi. Abdomen: Soft, non tender, bowel sounds present. Extremities: Bilateral lower extremity edema. Wounds: Sternum clean and dry.  No erythema or signs of infection. Ecchymos,is b/l and serosanginous ooze from incisions (both legs very edematous).  Lab Results: CBC:  Basename 01/02/12 0645 01/01/12 0530  WBC 14.6* 15.0*  HGB 9.9* 9.3*  HCT 29.4* 27.1*  PLT 169 142*   BMET:   Basename 01/02/12 0645 01/01/12 0530  NA 130* 128*  K 4.3 4.4  CL 93* 95*  CO2 24 25  GLUCOSE 118* 118*  BUN 22 23  CREATININE 0.58 0.61  CALCIUM 8.5 8.2*    PT/INR: No results found for this basename: LABPROT,INR in the last 72 hours ABG:  INR: Will add last result for INR, ABG once components are confirmed Will add last 4 CBG results once components are confirmed  Assessment/Plan:  1. CV - PAflutter/fib.Continue Amiodarone 400 bid and  Lopressor  25 bid. Started on Coumadin today as well so will decrease Ecasa to 81 mg. 2.  Pulmonary -  Encourage incentive spirometer.  3. Volume Overload - Continue with diuresis.  4.  Acute blood loss anemia - H/H stable at 9.9/29.4. 5.Leukocytosis-WBC slightly decreased from 15 to 14.6. Remains afebrile, no signs of wound infection, and no GU complaints.Re check in am. 6.TSH results with in normal limits. 7.May transfer to 2000.    ZIMMERMAN,DONIELLE MPA-C 01/03/2012       Chart reviewed, patient examined, agree with above.

## 2012-01-04 DIAGNOSIS — I4891 Unspecified atrial fibrillation: Secondary | ICD-10-CM | POA: Diagnosis not present

## 2012-01-04 DIAGNOSIS — I2510 Atherosclerotic heart disease of native coronary artery without angina pectoris: Secondary | ICD-10-CM | POA: Diagnosis not present

## 2012-01-04 DIAGNOSIS — I359 Nonrheumatic aortic valve disorder, unspecified: Secondary | ICD-10-CM | POA: Diagnosis not present

## 2012-01-04 LAB — CBC
HCT: 30.2 % — ABNORMAL LOW (ref 36.0–46.0)
Hemoglobin: 10.3 g/dL — ABNORMAL LOW (ref 12.0–15.0)
MCH: 29.9 pg (ref 26.0–34.0)
MCHC: 34.1 g/dL (ref 30.0–36.0)
MCV: 87.8 fL (ref 78.0–100.0)
Platelets: 129 10*3/uL — ABNORMAL LOW (ref 150–400)
RBC: 3.44 MIL/uL — ABNORMAL LOW (ref 3.87–5.11)
RDW: 17.4 % — ABNORMAL HIGH (ref 11.5–15.5)
WBC: 12.6 10*3/uL — ABNORMAL HIGH (ref 4.0–10.5)

## 2012-01-04 LAB — GLUCOSE, CAPILLARY
Glucose-Capillary: 110 mg/dL — ABNORMAL HIGH (ref 70–99)
Glucose-Capillary: 115 mg/dL — ABNORMAL HIGH (ref 70–99)
Glucose-Capillary: 118 mg/dL — ABNORMAL HIGH (ref 70–99)
Glucose-Capillary: 108 mg/dL — ABNORMAL HIGH (ref 70–99)

## 2012-01-04 LAB — PROTIME-INR
INR: 1.21 (ref 0.00–1.49)
Prothrombin Time: 15.6 seconds — ABNORMAL HIGH (ref 11.6–15.2)

## 2012-01-04 MED ORDER — WARFARIN SODIUM 5 MG PO TABS: 5.0000 mg | ORAL_TABLET | Freq: Every day | ORAL | Status: DC

## 2012-01-04 MED ORDER — FUROSEMIDE 40 MG PO TABS: 40.0000 mg | ORAL_TABLET | Freq: Every day | ORAL | Status: DC

## 2012-01-04 MED ORDER — FUROSEMIDE 40 MG PO TABS
40.0000 mg | ORAL_TABLET | Freq: Two times a day (BID) | ORAL | Status: DC
  Administered 2012-01-04 – 2012-01-06 (×5): 40 mg via ORAL
  Filled 2012-01-04 (×7): qty 1

## 2012-01-04 MED ORDER — POTASSIUM CHLORIDE CRYS ER 20 MEQ PO TBCR: 20.0000 meq | EXTENDED_RELEASE_TABLET | Freq: Every day | ORAL | Status: DC

## 2012-01-04 MED ORDER — TEMAZEPAM 15 MG PO CAPS
15.0000 mg | ORAL_CAPSULE | Freq: Every evening | ORAL | Status: DC | PRN
  Administered 2012-01-05 – 2012-01-06 (×3): 15 mg via ORAL
  Filled 2012-01-04 (×3): qty 1

## 2012-01-04 MED ORDER — AMIODARONE HCL 400 MG PO TABS: 400.0000 mg | ORAL_TABLET | Freq: Two times a day (BID) | ORAL | Status: DC

## 2012-01-04 MED ORDER — POTASSIUM CHLORIDE CRYS ER 20 MEQ PO TBCR
20.0000 meq | EXTENDED_RELEASE_TABLET | Freq: Two times a day (BID) | ORAL | Status: DC
  Administered 2012-01-04 – 2012-01-07 (×7): 20 meq via ORAL
  Filled 2012-01-04 (×10): qty 1

## 2012-01-04 MED ORDER — METOPROLOL TARTRATE 25 MG PO TABS: 25.0000 mg | ORAL_TABLET | Freq: Two times a day (BID) | ORAL | Status: DC

## 2012-01-04 MED ORDER — TRAMADOL HCL 50 MG PO TABS: 50.0000 mg | ORAL_TABLET | ORAL | Status: AC | PRN

## 2012-01-04 MED ORDER — FOLIC ACID 1 MG PO TABS: 1.0000 mg | ORAL_TABLET | Freq: Every day | ORAL | Status: DC

## 2012-01-04 MED ORDER — WARFARIN SODIUM 2.5 MG PO TABS
2.5000 mg | ORAL_TABLET | Freq: Once | ORAL | Status: AC
  Administered 2012-01-04: 2.5 mg via ORAL
  Filled 2012-01-04: qty 1

## 2012-01-04 NOTE — Progress Notes (Signed)
ANTICOAGULATION CONSULT NOTE - Follow Up Consult  Pharmacy Consult for Coumadin Indication: atrial fibrillation  Allergies  Allergen Reactions  . Ibuprofen     Hx of GI bleed  . Tape Rash    Reaction:Blisters (can use paper tape)    Patient Measurements: Height: 5\' 4"  (162.6 cm) Weight: 167 lb 12.3 oz (76.1 kg) IBW/kg (Calculated) : 54.7   Vital Signs: Temp: 98.4 F (36.9 C) (01/11 1338) Temp src: Oral (01/11 0445) BP: 124/68 mmHg (01/11 1338) Pulse Rate: 90  (01/11 1338)  Labs:  Basename 01/04/12 0540 01/02/12 0645  HGB 10.3* 9.9*  HCT 30.2* 29.4*  PLT 129* 169  APTT -- --  LABPROT 15.6* --  INR 1.21 --  HEPARINUNFRC -- --  CREATININE -- 0.58  CKTOTAL -- --  CKMB -- --  TROPONINI -- --   Estimated Creatinine Clearance: 54.2 ml/min (by C-G formula based on Cr of 0.58).  Assessment:   Coumadin begun 1/10 with 4 mg x 1.   Coumadin 2.5 mg to be given today, per Dr. Laneta Simmers.   Conservative dosing with concurrent Amiodarone & age.  Goal of Therapy:  INR 2-3   Plan:    Coumadin 2.5 mg today as planned.   PT/INR daily while in the hospital.   Husband relates also taking Coumadin & Amiodarone, so they are familiar with the monitoring, precautions and potential drug & food interactions.  Dennie Fetters Pager:  [phone] 01/04/2012,3:24 PM

## 2012-01-04 NOTE — Progress Notes (Signed)
Occupational Therapy Treatment Patient Details Name: Carol [name] MRN: [id number] DOB: 20-Feb-1928 Today's Date: 01/04/2012  OT Assessment/Plan OT Assessment/Plan Comments on Treatment Session: Activity tolerance better this session- however pt did report fatigue at end of session OT Plan: Discharge plan remains appropriate Follow Up Recommendations: Home health OT Equipment Recommended: None recommended by OT OT Goals ADL Goals ADL Goal: Grooming - Progress: Progressing toward goals ADL Goal: Toilet Transfer - Progress: Progressing toward goals ADL Goal: Toileting - Clothing Manipulation - Progress: Progressing toward goals ADL Goal: Toileting - Hygiene - Progress: Progressing toward goals Pt Will Perform Tub/Shower Transfer: Shower transfer;with supervision;Ambulation;with DME ADL Goal: Tub/Shower Transfer - Progress: Revised (modified due to lack of progress/goal met) ADL Goal: Additional Goal #1 - Progress: Progressing toward goals  OT Treatment Precautions/Restrictions  Precautions Precautions: Sternal Restrictions Weight Bearing Restrictions: No   ADL ADL Eating/Feeding: Performed;Set up Eating/Feeding Details (indicate cue type and reason): assisted patient in setting up for lunch at end of session- pt. with difficulty opening containers Where Assessed - Eating/Feeding: Chair Grooming: Performed;Wash/dry hands;Supervision/safety Where Assessed - Grooming: Standing at sink Toilet Transfer: Research scientist (life sciences) Method: Proofreader: Automotive engineer - Clothing Manipulation: Performed;Supervision/safety Where Assessed - Glass blower/designer Manipulation: Standing Toileting - Hygiene: Performed;Supervision/safety Where Assessed - Toileting Hygiene: Standing Tub/Shower Transfer: Performed;Minimal Radiation protection practitioner Details (indicate cue type and reason): hand-held and grab bar for UE  support into and out of shower Tub/Shower Transfer Method: Science writer: Grab bars;Walk in shower Ambulation Related to ADLs: supervision with RW ambulation; supervision with sit to stand from toilet and chair- VC for hand placement  Mobility  Bed Mobility Rolling Right: 6: Modified independent (Device/Increase time) Rolling Left: 6: Modified independent (Device/Increase time) Right Sidelying to Sit: 6: Modified independent (Device/Increase time);HOB flat Sit to Sidelying Right: 6: Modified independent (Device/Increase time);HOB flat Transfers Sit to Stand: 5: Supervision;From chair/3-in-1;From bed Sit to Stand Details (indicate cue type and reason): cues for hands on thighs secondary to precautions x 3 trials Stand to Sit: 5: Supervision;To chair/3-in-1;To bed  End of Session OT - End of Session Equipment Utilized During Treatment: Gait belt Activity Tolerance: Patient tolerated treatment well (fatigued at end of session) Patient left: in bed;with call bell in reach (with lunch tray set up) General Behavior During Session: [facility] for tasks performed Cognition: [facility] for tasks performed  Cray Monnin  01/04/2012, 1:11 PM

## 2012-01-04 NOTE — Discharge Summary (Signed)
Physician Discharge Summary  Patient ID: Carol [name] MRN: [id number] DOB/AGE: 05-01-1928 78 y.o.  Admit date: 12/26/2011 Discharge date: 01/05/2012  Admission Diagnoses: 1. CAD (coronary artery disease) 2.History of severe  Aortic stenosis 3.History of hyperlipidemia  4.History of hypothyroidism  5.History of spinal stenosis  6.Lumbar disc disease  7.History of obesity 8.Anemia, iron deficiency 9.Lung nodules 10.History of villous adenoma (rectum) 11.History of hypertension 12.History of remote tobacco abuse  Discharge Diagnoses:  1. CAD (coronary artery disease) 2.History of severe  Aortic stenosis 3.History of hyperlipidemia  4.History of hypothyroidism  5.History of spinal stenosis  6.Lumbar disc disease  7.History of obesity 8.Anemia, iron deficiency 9.Lung nodules 10.History of villous adenoma (rectum) 11.History of hypertension 12.History of remote tobacco abuse 13.Post op Afib/flutter 14.Thrombocytopenia   Procedure (s):  1.Cardiac Catheterization done by Dr. Donnie Aho on 12/26/2011: Aortic valve area 0.55 cm  ANGIOGRAPHIC DATA:  CORONARY ARTERIES: Arise and distribute normally. Right dominant. Significant coronary calcification is noted in the ostium of the left main coronary artery as well as the length of the right coronary artery. It is also seen in the aorta.  Left main coronary artery: Severe calcification of the ostium. Significant catheter ventricularization is noted with damping on engagement. Angiographically appears to have a severe ostial left main stenosis.  Left anterior descending: Mild calcification noted but appears to be a large vessel. No significant disease noted. .  Circumflex coronary artery: Mild irregularity.  Right coronary artery: Calcification throughout. Mid-vessel 40% stenosis. Severe ostial stenosis involving a small posterolateral branch.  LEFT VENTRICULOGRAM: Performed in the 30 RAO projection. The aortic valve is calcified with  diminished opening. The left ventricle is normal in size. There appears to be global hypokinesis with an estimated ejection fraction of 30%.  IMPRESSIONS:  1. Severe aortic stenosis 2. Abnormal left ventricular function with global hypokinesis and ejection fraction 30% 3. Severe ostial left main coronary artery disease, moderate disease involving the right coronary artery 4. Normal pulmonary artery pressures  2.Aortic valve replacement with pericardial tissue valve ([employer],model [id number] 19 mm, serial number [id number]);  coronary artery bypass grafting x3 (left internal mammary to the  left anterior descending coronary artery, reverse saphenous vein graft  to the intermediate coronary artery, reverse saphenous vein graft to the  distal right coronary artery) with both endo-vein harvesting and open  left lower leg vein harvest, and wedge resection of left upper lobe lung  Nodule by Dr. Tyrone Sage 12/28/2011.  History of Presenting Illness: This is an 78 yo Caucasian female with known aortic valve murmur for 10 years.She presented to [facility] with episodes of increasing SOB with exertion. On Christmas eve, she developed sob and wheezing. She denies chest pain, syncope, near syncope, PND or pedal edema. She has no history of MI. A cardiac catheterization was done by Dr. Donnie Aho on 12/26/2011. Results indicated severe aortic stenosis, abnormal left ventricular function (30%), global hypokinesis, and 3 vessel coronary artery disease. A cardiothoracic consultation obtained Dr. Tyrone Sage for consideration of coronary artery bypass grafting surgery. Potential risks, complications, and benefits of the surgery were discussed with the patient and she agreed to proceed. Preoperative carotid duplex carotid ultrasound showed a 40-59% left internal carotid artery stenosis but no significant right internal carotid artery stenosis. ABIs were 0.59 on the right and 0.53 on the left.  She underwent an  AVR and CABG x 3 on 12/28/2011.  Brief Hospital Course:  The patient was extubated earlier the afternoon of surgery without difficulty. She remained afebrile  and hemodynamically stable. She was on Milrinone and Neo-Synephrine postoperatively. Swan-Ganz, A-line, chest tubes, and Foley were all removed early in her hospital course. She was weaned off of her drips. She was started on a low-dose beta blocker. She was found to be volume overloaded and diuresed accordingly. She was also found to have thrombocytopenia postoperatively. Her platelet count went as low as 85,000. Heparin and heparin mycotic for a voided and her last platelet count was up to 129,000. She was also found to have anemia postoperatively  (has a prior history of IDA). Her H&H went as  low as 8.6 and 25.2. She did not require postoperative transfusion and her last H&H was up to 10.3 and 30.2 respectively. She did develop postop A. flutter and Afib. She did convert to sinus rhythm with amiodarone and Lopressor by mouth. She was also been started on Coumadin. Daily PT and INR were monitored daily. She was felt surgically stable for transfer from the intensive care unit to [facility]U on 01/01/12.  She then developed loose stools and all stool softeners were stopped. These did decrease but she continued to have loose stools and a CDIF has been ordered. She continued to progress with cardiac rehabilitation. She was already tolerating a diet. Epicardial pacing wires and chest tube sutures will be removed prior to her discharge. Provided she remains afebrile, and in a stable, and pending morning round evaluation, she'll be surgically stable for discharge on 01/05/2012.   Filed Vitals:   01/04/12 1050  BP: 148/61  Pulse: 124  Temp:      97.9 (36.6 C)     Latest Vital Signs: Blood pressure 148/61, pulse 124, temperature 97.9 F (36.6 C), temperature source Oral, resp. rate 18, height 5\' 4"  (1.626 m), weight 167 lb 12.3 oz (76.1 kg), SpO2  97.00%.  Physical Exam: General appearance: alert and cooperative  Heart: regular rate and rhythm  Lungs: clear to auscultation bilaterally  Extremities: edema moderate bilateral leg with serous drainage from vein harvest incisions  Wound: chest incision ok    Discharge Condition:Stable  Recent laboratory studies:  Lab Results  Component Value Date   WBC 12.6* 01/04/2012   HGB 10.3* 01/04/2012   HCT 30.2* 01/04/2012   MCV 87.8 01/04/2012   PLT 129* 01/04/2012   Lab Results  Component Value Date   NA 130* 01/02/2012   K 4.3 01/02/2012   CL 93* 01/02/2012   CO2 24 01/02/2012   CREATININE 0.58 01/02/2012   GLUCOSE 118* 01/02/2012      Diagnostic Studies: Dg Chest 2 View  01/02/2012  *RADIOLOGY REPORT*  Clinical Data: Follow up CABG and aortic valve replacement, weakness and shortness of breath  CHEST - 2 VIEW  Comparison: Portable chest x-ray of 01/01/2012  Findings: Aeration of the lungs has improved.  There are bilateral pleural effusions present with basilar atelectasis remaining. Nodular opacity in the periphery the left upper lung field with adjacent sutures appears stable. Cardiomegaly is stable.  Median sternotomy sutures are noted.  An aortic valve replacement is present.  IMPRESSION: Improved aeration.  Small effusions and basilar atelectasis remain. Apparent postop opacity is unchanged in the periphery of the left upper lung field.  Original Report Authenticated By: Juline Patch, M.D.    Discharge Orders    Future Appointments: Provider: Department: Dept Phone: Center:   01/18/2012 11:00 AM Sandrea Hughs, MD [facility] [phone] None   01/24/2012 12:30 PM Delight Ovens, MD [facility] [phone] [id number]  Discharge Medications: Current Discharge Medication List    START taking these medications   Details  amiodarone (PACERONE) 400 MG tablet Take 1 tablet (400 mg total) by mouth 2 (two) times daily. For one week;then take Amiodarone 400 mg po daily thereafter. Qty:  60 tablet, Refills: 1    folic acid (FOLVITE) 1 MG tablet Take 1 tablet (1 mg total) by mouth daily. For one month then stop. Qty: 30 tablet, Refills: 0    furosemide (LASIX) 40 MG tablet Take 1 tablet (40 mg total) by mouth daily. For one week then stop. Qty: 30 tablet, Refills: 0    metoprolol tartrate (LOPRESSOR) 25 MG tablet Take 1 tablet (25 mg total) by mouth 2 (two) times daily. Qty: 60 tablet, Refills: 1    potassium chloride SA (K-DUR,KLOR-CON) 20 MEQ tablet Take 1 tablet (20 mEq total) by mouth daily. For one week then stop. Qty: 30 tablet, Refills: 0    traMADol (ULTRAM) 50 MG tablet Take 1-2 tablets (50-100 mg total) by mouth every 4 (four) hours as needed for pain. Qty: 45 tablet, Refills: 0    warfarin (COUMADIN) 5 MG tablet Take 1 tablet (5 mg total) by mouth daily. Or as directed by Dr. York Spaniel office. Qty: 30 tablet, Refills: 1      CONTINUE these medications which have NOT CHANGED   Details  aspirin EC 81 MG tablet Take 81 mg by mouth daily.      atorvastatin (LIPITOR) 20 MG tablet Take 20 mg by mouth at bedtime.      Calcium Carbonate-Vitamin D (CALCIUM 600 + D PO) Take 1 tablet by mouth 2 (two) times daily.      cholecalciferol (VITAMIN D) 400 UNITS TABS Take 400 Units by mouth daily.      gabapentin (NEURONTIN) 300 MG capsule Take 300 mg by mouth 3 (three) times daily.      levothyroxine (SYNTHROID, LEVOTHROID) 75 MCG tablet Take 75 mcg by mouth daily.      Lutein 20 MG CAPS Take 20 mg by mouth daily.      !! Multiple Vitamin (MULITIVITAMIN WITH MINERALS) TABS Take 1 tablet by mouth daily.      !! Multiple Vitamins-Minerals (OCUVITE PRESERVISION PO) Take 1 tablet by mouth 2 (two) times daily.      OVER THE COUNTER MEDICATION Take 1 tablet by mouth daily. OTC MEDICATION: COLON HEALTH     vitamin C (ASCORBIC ACID) 500 MG tablet Take 500 mg by mouth daily.       !! - Potential duplicate medications found. Please discuss with provider.    STOP taking  these medications     docusate sodium (COLACE) 100 MG capsule      losartan (COZAAR) 50 MG tablet         Follow Up Appointments: Follow-up Information    Follow up with TILLEY JR,W SPENCER, MD. (Call for an appoinment for 2 weeks)    Contact information:   [address] [place] [address] [phone]       Follow up with Sheliah Plane B, MD. (PA/LAT CXR is to be taken on 01/24/2012  at 11:45 am;Appointment with Dr. Tyrone Sage is on 01/24/2012  at  12:30 pm)    Contact information:   [address] [place] [address] [phone]       Follow up with Home Health. (To draw PT/INR on Monday 01/07/2012 and fax result to Dr. York Spaniel office)  Signed: Smriti Barkow MPA-C 01/04/2012, 1:09 PM

## 2012-01-04 NOTE — Progress Notes (Signed)
7 Days Post-Op Procedure(s) (LRB): AORTIC VALVE REPLACEMENT (AVR) (N/A) CORONARY ARTERY BYPASS GRAFTING (CABG) (N/A) Subjective: Can't sleep in the hospital. Otherwise feels ok and wants to go home  Objective: Vital signs in last 24 hours: Temp:  [97.4 F (36.3 C)-97.9 F (36.6 C)] 97.9 F (36.6 C) (01/11 0445) Pulse Rate:  [68-122] 68  (01/11 0445) Cardiac Rhythm:  [-] Heart block (01/11 0801) Resp:  [18] 18  (01/11 0445) BP: (108-148)/(61-78) 148/61 mmHg (01/11 0445) SpO2:  [92 %-94 %] 94 % (01/11 0445) Weight:  [76.1 kg (167 lb 12.3 oz)] 76.1 kg (167 lb 12.3 oz) (01/11 0445)  Hemodynamic parameters for last 24 hours:    Intake/Output from previous day: 01/10 0701 - 01/11 0700 In: 600 [P.O.:600] Out: 1657 [Urine:1650; Stool:7] Intake/Output this shift: Total I/O In: 240 [P.O.:240] Out: 201 [Urine:200; Stool:1]  General appearance: alert and cooperative Heart: regular rate and rhythm Lungs: clear to auscultation bilaterally Extremities: edema moderate bilateral leg with serous drainage from vein harvest incisions Wound: chest incision ok  Lab Results:  Basename 01/04/12 0540 01/02/12 0645  WBC 12.6* 14.6*  HGB 10.3* 9.9*  HCT 30.2* 29.4*  PLT 129* 169   BMET:  Basename 01/02/12 0645  NA 130*  K 4.3  CL 93*  CO2 24  GLUCOSE 118*  BUN 22  CREATININE 0.58  CALCIUM 8.5    PT/INR:  Basename 01/04/12 0540  LABPROT 15.6*  INR 1.21   ABG    Component Value Date/Time   PHART 7.329* 12/28/2011 2205   HCO3 24.8* 12/28/2011 2205   TCO2 24 12/29/2011 1705   ACIDBASEDEF 1.0 12/28/2011 2205   O2SAT 94.0 12/28/2011 2205   CBG (last 3)   Basename 01/04/12 0614 01/03/12 2210 01/03/12 1625  GLUCAP 110* 113* 133*    Assessment/Plan: S/P Procedure(s) (LRB): AORTIC VALVE REPLACEMENT (AVR) (N/A) CORONARY ARTERY BYPASS GRAFTING (CABG) (N/A) Mobilize Diuresis:  Will give lasix twice today Coumadin 2.5mg .  Go easy with amio at 78 yo. Plan for discharge: She may be able  to go home tomorrow.  Her husband is home and daughter will be there for a while.  She will need HHRN to check incisions in leg. Will need to stay on diuretic for a while.     LOS: 9 days    BARTLE,BRYAN K 01/04/2012

## 2012-01-04 NOTE — Progress Notes (Signed)
Patient ambulated 150 ft with rolling walker and standby assist. Patient 02 sats >90.  Patient tolerated procedure well.

## 2012-01-04 NOTE — Progress Notes (Signed)
Subjective:  Still having diarrhea.  Not SOB.  Still swollen.  No chest pain or SOB.  Objective:  Vital Signs in the last 24 hours: BP 148/61  Pulse 68  Temp(Src) 97.9 F (36.6 C) (Oral)  Resp 18  Ht 5\' 4"  (1.626 m)  Wt 76.1 kg (167 lb 12.3 oz)  BMI 28.80 kg/m2  SpO2 94%  Physical Exam: Pleasant WF in NAD, alert oriented Lungs: Reduced breath sounds Cardiac:  Regular rhythm, normal S1 and S2, no S3, soft 1/6 murmur Abdomen:  Soft, nontender, no masses Extremities:  2+ edema present, Leg wrapped on left  Intake/Output from previous day: 01/10 0701 - 01/11 0700 In: 600 [P.O.:600] Out: 1657 [Urine:1650; Stool:7]  Weight change: -0.2 kg (-7.1 oz)  Lab Results: Basic Metabolic Panel:  [facility] 01/02/12 0645  NA 130*  K 4.3  CL 93*  CO2 24  GLUCOSE 118*  BUN 22  CREATININE 0.58    CBC:  Basename 01/04/12 0540 01/02/12 0645  WBC 12.6* 14.6*  NEUTROABS -- --  HGB 10.3* 9.9*  HCT 30.2* 29.4*  MCV 87.8 88.3  PLT 129* 169    PROTIME: Lab Results  Component Value Date   INR 1.21 01/04/2012   INR 1.83* 12/28/2011   INR 1.00 12/27/2011    Telemetry: Reviewed ,sinus rhythm with PVC's.  No recurrence of a flutter  Assessment/Plan:  1. Recent AVR/CABG 2. Blood loss anemia 3. A fluttter improved 4. Diarrhea back.  Check C. Difficile. Review meds  REC:  Warfarin for a few months following AVR as well as a flutter.  Pharmacy to dose. Needs HHRN and aide on d/c.   Darden Palmer.  MD [facility] 01/04/2012, 9:59 AM

## 2012-01-04 NOTE — Progress Notes (Signed)
CARDIAC REHAB PHASE I   PRE:  Rate/Rhythm: 88 SR with PVCs    BP: sitting 122/66    SaO2: 92 RA  MODE:  Ambulation: 150 ft   POST:  Rate/Rhythm: 94    BP: sitting 126/62     SaO2: 90 RA  Pt tired this pm, has had two walks already. To BSC then ambulated 150 ft with RW, assist x2. No major problems, just fatigues easily. Return to chair with VSS.  [id number]  Harriet Masson CES, ACSM

## 2012-01-04 NOTE — Progress Notes (Signed)
Physical Therapy Treatment Patient Details Name: Carol [name] MRN: [id number] DOB: 1928/08/18 Today's Date: 01/04/2012  PT Assessment/Plan  PT - Assessment/Plan Comments on Treatment Session: Pt s/p CABG and AVR. Pt unable to recall precautions and educated for these with all transfers with handout provided. Pt able to perform own self care and used briefs to make pt feel confidant with ambulation secondary to frequent loose bowels. Pt very pleasant and eager to return home with family. PT Plan: Discharge plan remains appropriate;Frequency remains appropriate Follow Up Recommendations: Supervision for mobility/OOB;Home health PT PT Goals  Acute Rehab PT Goals Pt will Roll Supine to Right Side: with min assist PT Goal: Rolling Supine to Right Side - Progress: Met Pt will go Supine/Side to Sit: with modified independence PT Goal: Supine/Side to Sit - Progress: Met Pt will go Sit to Supine/Side: with modified independence PT Goal: Sit to Supine/Side - Progress: Met Pt will go Sit to Stand: with modified independence PT Goal: Sit to Stand - Progress: Updated due to goal met Pt will go Stand to Sit: with modified independence PT Goal: Stand to Sit - Progress: Updated due to goals met PT Transfer Goal: Bed to Chair/Chair to Bed - Progress: Met PT Goal: Ambulate - Progress: Progressing toward goal PT Goal: Up/Down Stairs - Progress: Discontinued (comment) (pt reports no steps to enter home) Additional Goals PT Goal: Additional Goal #1 - Progress: Progressing toward goal  PT Treatment Precautions/Restrictions  Precautions Precautions: Sternal Precaution Comments: Patient unable to recall sternal precautions. Educated patient on precautions and encouraged generalization of these to all activities. Restrictions Weight Bearing Restrictions: No Mobility (including Balance) Bed Mobility Rolling Right: 6: Modified independent (Device/Increase time) Rolling Left: 6: Modified independent  (Device/Increase time) Right Sidelying to Sit: 6: Modified independent (Device/Increase time);HOB flat Sit to Sidelying Right: 6: Modified independent (Device/Increase time);HOB flat Transfers Sit to Stand: 5: Supervision;From chair/3-in-1;From bed Sit to Stand Details (indicate cue type and reason): cues for hands on thighs secondary to precautions x 3 trials Stand to Sit: 5: Supervision;To chair/3-in-1;To bed Ambulation/Gait Ambulation/Gait Assistance: 5: Supervision Ambulation/Gait Assistance Details (indicate cue type and reason): cues for posture and position in RW Ambulation Distance (Feet): 220 Feet Assistive device: Rolling walker Gait Pattern: Step-through pattern;Within Functional Limits Stairs: No  Posture/Postural Control Posture/Postural Control: No significant limitations Balance Balance Assessed: No Exercise    End of Session PT - End of Session Activity Tolerance: Patient tolerated treatment well Patient left: in chair Nurse Communication: Mobility status for transfers;Mobility status for ambulation General Behavior During Session: [facility] for tasks performed Cognition: [facility] for tasks performed  Delorse Lek 01/04/2012, 12:25 PM Toney Sang, PT [phone]

## 2012-01-05 DIAGNOSIS — I4892 Unspecified atrial flutter: Secondary | ICD-10-CM | POA: Diagnosis not present

## 2012-01-05 DIAGNOSIS — I4891 Unspecified atrial fibrillation: Secondary | ICD-10-CM | POA: Diagnosis not present

## 2012-01-05 LAB — BASIC METABOLIC PANEL
BUN: 26 mg/dL — ABNORMAL HIGH (ref 6–23)
CO2: 26 mEq/L (ref 19–32)
Calcium: 9 mg/dL (ref 8.4–10.5)
Chloride: 90 mEq/L — ABNORMAL LOW (ref 96–112)
Creatinine, Ser: 0.76 mg/dL (ref 0.50–1.10)
GFR calc Af Amer: 89 mL/min — ABNORMAL LOW (ref >90)
GFR calc non Af Amer: 76 mL/min — ABNORMAL LOW (ref >90)
Glucose, Bld: 114 mg/dL — ABNORMAL HIGH (ref 70–99)
Potassium: 3.6 mEq/L (ref 3.5–5.1)
Sodium: 128 mEq/L — ABNORMAL LOW (ref 135–145)

## 2012-01-05 LAB — PROTIME-INR
INR: 1.19 (ref 0.00–1.49)
Prothrombin Time: 15.4 seconds — ABNORMAL HIGH (ref 11.6–15.2)

## 2012-01-05 LAB — GLUCOSE, CAPILLARY
Glucose-Capillary: 106 mg/dL — ABNORMAL HIGH (ref 70–99)
Glucose-Capillary: 127 mg/dL — ABNORMAL HIGH (ref 70–99)
Glucose-Capillary: 119 mg/dL — ABNORMAL HIGH (ref 70–99)
Glucose-Capillary: 113 mg/dL — ABNORMAL HIGH (ref 70–99)

## 2012-01-05 MED ORDER — WARFARIN SODIUM 2.5 MG PO TABS
2.5000 mg | ORAL_TABLET | Freq: Once | ORAL | Status: AC
  Administered 2012-01-05: 2.5 mg via ORAL
  Filled 2012-01-05: qty 1

## 2012-01-05 MED ORDER — POTASSIUM CHLORIDE CRYS ER 20 MEQ PO TBCR
40.0000 meq | EXTENDED_RELEASE_TABLET | Freq: Once | ORAL | Status: AC
  Administered 2012-01-05: 40 meq via ORAL

## 2012-01-05 MED ORDER — METOPROLOL TARTRATE 50 MG PO TABS
50.0000 mg | ORAL_TABLET | Freq: Two times a day (BID) | ORAL | Status: DC
  Administered 2012-01-05 – 2012-01-07 (×5): 50 mg via ORAL
  Filled 2012-01-05 (×5): qty 1

## 2012-01-05 NOTE — Progress Notes (Addendum)
8 Days Post-Op Procedure(s) (LRB): AORTIC VALVE REPLACEMENT (AVR) (N/A) CORONARY ARTERY BYPASS GRAFTING (CABG) (N/A)  Subjective: Diarrhea has stopped. She really wants to go home.  Objective: Vital signs in last 24 hours: Patient Vitals for the past 24 hrs:  BP Temp Temp src Pulse Resp SpO2 Weight  01/05/12 0537 124/72 mmHg 97.3 F (36.3 C) Oral 92  16  94 % 164 lb 0.4 oz (74.4 kg)  01/04/12 1950 141/54 mmHg 97.4 F (36.3 C) Oral 96  20  94 % -  01/04/12 1338 124/68 mmHg 98.4 F (36.9 C) - 90  18  97 % -  01/04/12 1050 - - - 124  - 97 % -  01/04/12 1035 - - - 92  - 92 % -   Pre op weight 66.7 kg Current Weight  01/05/12 164 lb 0.4 oz (74.4 kg)      Intake/Output from previous day: 01/11 0701 - 01/12 0700 In: 960 [P.O.:960] Out: 1226 [Urine:1225; Stool:1]   Physical Exam:  Cardiovascular: RRR, no murmurs, gallops, or rubs. Pulmonary: Clear to auscultation bilaterally; no rales, wheezes, or rhonchi. Abdomen: Soft, non tender, bowel sounds present. Extremities: 2+ bilateral lower extremity edema. Wounds: Sternal wound is clean and dry.  No erythema or signs of infection.Serosanginous oozing from bilateral leg wounds.  Lab Results: CBC: Basename 01/04/12 0540  WBC 12.6*  HGB 10.3*  HCT 30.2*  PLT 129*   BMET:  Basename 01/05/12 0650  NA 128*  K 3.6  CL 90*  CO2 26  GLUCOSE 114*  BUN 26*  CREATININE 0.76  CALCIUM 9.0    PT/INR:  Basename 01/05/12 0650  LABPROT 15.4*  INR 1.19   ABG:  INR: Will add last result for INR, ABG once components are confirmed Will add last 4 CBG results once components are confirmed  Assessment/Plan:  1. CV - PaFib/flutter. Maintaining SR/PVCs. Continue Amiodarone 400 bid, Lopressor 25 bid, and Coumadin (pharmacy dosing). 2.  Pulmonary - Encourage incentive spirometer. 3. Volume Overload - Continue with bid diuresis. 4.  Acute blood loss anemia - H/H stable at 10.3/30.2. 5.Supplement Potassium 6.Hyponatremia-Sodium 128.  Likely secondary to diuresis. 7.Mild thrombocytopenia-Platelets 129,000 8.Leukocytosis-WBC decreased 14.6 to 12.6. Remains afebrile, no signs of wound infection, and no GU complaints. 9.Remove EPW. 10.Diarrhea resolved. Has not had any more bowel movements. Will cancel CDIF. 11.Possible discharge 1-2 days.   ZIMMERMAN,DONIELLE MPA-C 01/05/2012   patient examined and medical record reviewed,agree with above note.Not ready to go home until edema improves VAN TRIGT III,Alvey Brockel 01/05/2012

## 2012-01-05 NOTE — Progress Notes (Signed)
SUBJECTIVE:  No chest pain  OBJECTIVE:   Vitals:   Filed Vitals:   01/04/12 1050 01/04/12 1338 01/04/12 1950 01/05/12 0537  BP:  124/68 141/54 124/72  Pulse: 124 90 96 92  Temp:  98.4 F (36.9 C) 97.4 F (36.3 C) 97.3 F (36.3 C)  TempSrc:   Oral Oral  Resp:  18 20 16   Height:      Weight:    74.4 kg (164 lb 0.4 oz)  SpO2: 97% 97% 94% 94%   I&O's:   Intake/Output Summary (Last 24 hours) at 01/05/12 1002 Last data filed at 01/05/12 0846  Gross per 24 hour  Intake    960 ml  Output   1125 ml  Net   -165 ml   TELEMETRY: Reviewed telemetry pt in atrial flutter with RVR     PHYSICAL EXAM General: Well developed, well nourished, in no acute distress Head: Eyes PERRLA, No xanthomas.   Normal cephalic and atramatic  Lungs:   Clear bilaterally to auscultation and percussion. Heart:   Irregularly irregular S1 S2 Pulses are 2+ & equal.            No carotid bruit. No JVD.  No abdominal bruits. No femoral bruits. Abdomen: Bowel sounds are positive, abdomen soft and non-tender without masses  Extremities:   No clubbing, cyanosis or edema.  DP +1 Neuro: Alert and oriented X 3. Psych:  Good affect, responds appropriately   LABS: Basic Metabolic Panel:  Basename 01/05/12 0650  NA 128*  K 3.6  CL 90*  CO2 26  GLUCOSE 114*  BUN 26*  CREATININE 0.76  CALCIUM 9.0  MG --  PHOS --   CBC:  Basename 01/04/12 0540  WBC 12.6*  NEUTROABS --  HGB 10.3*  HCT 30.2*  MCV 87.8  PLT 129*   Thyroid Function Tests:  Basename 01/03/12 0505  TSH 2.893  T4TOTAL --  T3FREE --  THYROIDAB --     Coag Panel:   Lab Results  Component Value Date   INR 1.19 01/05/2012   INR 1.21 01/04/2012   INR 1.83* 12/28/2011    RADIOLOGY: Dg Chest 2 View  01/02/2012  *RADIOLOGY REPORT*  Clinical Data: Follow up CABG and aortic valve replacement, weakness and shortness of breath  CHEST - 2 VIEW  Comparison: Portable chest x-ray of 01/01/2012  Findings: Aeration of the lungs has improved.   There are bilateral pleural effusions present with basilar atelectasis remaining. Nodular opacity in the periphery the left upper lung field with adjacent sutures appears stable. Cardiomegaly is stable.  Median sternotomy sutures are noted.  An aortic valve replacement is present.  IMPRESSION: Improved aeration.  Small effusions and basilar atelectasis remain. Apparent postop opacity is unchanged in the periphery of the left upper lung field.  Original Report Authenticated By: Juline Patch, M.D.   Dg Chest 2 View  12/20/2011  *RADIOLOGY REPORT*  Clinical Data: Shortness of breath.  CHEST - 2 VIEW  Comparison: Radiographs 09/26/2011.  PET CT 11/20/2011.  Chest CT 07/13/2011.  Findings: The heart size and mediastinal contours are stable with mild cardiomegaly.  There are new small bilateral pleural effusions.  There is vascular congestion without overt pulmonary edema.  No confluent airspace opacity is seen.  The subpleural nodule in the left upper lobe appears stable compared with the mentioned priors above.  This is a new finding from 02/14/2010 radiographs.  IMPRESSION:  1.  Stable left upper lobe subpleural nodule compared with recent prior examinations. As  noted on the PET CT, this has decreased in size from 07/13/2011. 2.  Cardiomegaly with vascular congestion and small bilateral pleural effusions.  Findings suggest mild congestive heart failure.  Original Report Authenticated By: Gerrianne Scale, M.D.   Dg Chest Port 1 View  12/31/2011  *RADIOLOGY REPORT*  Clinical Data: Atelectasis and effusions.  PORTABLE CHEST - 1 VIEW  Comparison: 01/06 and 12/29/2011  Findings: No pneumothorax. Sheath remains in the superior vena cava.  Improving slight atelectasis in the left upper lobe.  Slightly increased small right effusion.  Small left effusion with slight atelectasis at the base, unchanged.  Persistent chronic cardiomegaly.  Vascularity is normal.  IMPRESSION:  1.  Improving atelectasis in the left upper  lung zone. 2.  Slight increase in small right effusion.  Original Report Authenticated By: Gwynn Burly, M.D.   Dg Chest Portable 1 View In Am  12/30/2011  *RADIOLOGY REPORT*  Clinical Data: Coronary bypass, aortic valve replacement  PORTABLE CHEST - 1 VIEW  Comparison: 12/29/2011  Findings: Mediastinal drain, Swan-Ganz catheter and left chest tube all removed.  Right IJ vascular sheath remains in stable position. Coronary bypass changes and aortic valve replacement noted.  Heart remains enlarged with vascular congestion and left lower lobe atelectasis / consolidation.  No pneumothorax.  Stable focal left upper lobe opacity, status post left upper lobe wedge resection.  IMPRESSION: Stable postoperative portable chest exam.  No pneumothorax.  Original Report Authenticated By: Judie Petit. Ruel Favors, M.D.   Dg Chest Portable 1 View In Am  12/29/2011  *RADIOLOGY REPORT*  Clinical Data: Bypass surgery.  PORTABLE CHEST - 1 VIEW  Comparison: Chest x-ray 12/28/2011.  Findings: The endotracheal tube, right-sided chest tube and NG tubes have been removed.  The right IJ Swan-Ganz catheter is stable and the left-sided chest tube is stable.  No pneumothorax.  Mild vascular congestion and areas of atelectasis persist.  Slight improved left upper lobe opacity.  IMPRESSION: Removal of ET, NG and right-sided chest tubes. No pneumothorax. Mild vascular congestion and minimal areas of atelectasis.  Original Report Authenticated By: P. Loralie Champagne, M.D.   Dg Chest Portable 1 View  12/28/2011  *RADIOLOGY REPORT*  Clinical Data: Postop CABG and valve replacement  PORTABLE CHEST - 1 VIEW  Comparison: 12/20/2011  Findings: There is a Swan-Ganz catheter with tip in the main pulmonary artery.  The ET tube tip is above the carina.  Bilateral chest tubes are noted without evidence for pneumothorax.  Increased in the left upper lobe opacity which now measures approximately 4.5 cm.  IMPRESSION:  1.  Postoperative changes compatible with  CABG procedure. 2.  Bilateral chest tubes without pneumothorax. 3.  Increase in left upper lobe opacity which may reflect postoperative change.  Original Report Authenticated By: Rosealee Albee, M.D.   Dg Chest Port 1v Same Day  01/01/2012  *RADIOLOGY REPORT*  Clinical Data: Follow-up CABG.  PORTABLE CHEST - 1 VIEW SAME DAY  Comparison: 12/31/2011  Findings: Changes of CABG and valve replacement.  Cardiomegaly with vascular congestion.  Increasing interstitial prominence, likely edema.  There is bibasilar atelectasis and small bilateral effusions.  No pneumothorax.  IMPRESSION: Increasing interstitial prominence, likely edema.  Continued bibasilar atelectasis and small effusions.  Original Report Authenticated By: Cyndie Chime, M.D.      ASSESSMENT:  1. Recent AVR/CABG  2. Blood loss anemia  3. A fluttter improved but still not adequately rate controlled - HR now in 120's 4. Diarrhea back. Cdiff pending   PLAN:  1.  Increase metoprolol to 50mg  BID for better rate control   Quintella Reichert, MD  01/05/2012  10:02 AM

## 2012-01-05 NOTE — Progress Notes (Signed)
CARDIAC REHAB PHASE I  Pt discharge education completed. Covered procedures, precautions/restrictions, incisional care, diet, home exercise, when to call 911, and outpatient cardiac rehab. Pt interested in participating in phase II at [facility]. Pt voiced understanding to education and all questions were answered. Will send referral and follow up.  Harriett Sine MS

## 2012-01-05 NOTE — Progress Notes (Signed)
ANTICOAGULATION CONSULT NOTE - Follow Up Consult  Pharmacy Consult for Coumadin Indication: atrial fibrillation  Allergies  Allergen Reactions  . Ibuprofen     Hx of GI bleed  . Tape Rash    Reaction:Blisters (can use paper tape)    Patient Measurements: Height: 5\' 4"  (162.6 cm) Weight: 164 lb 0.4 oz (74.4 kg) IBW/kg (Calculated) : 54.7   Vital Signs: Temp: 97.3 F (36.3 C) (01/12 0537) Temp src: Oral (01/12 0537) BP: 124/72 mmHg (01/12 0537) Pulse Rate: 92  (01/12 0537)  Labs:  Basename 01/05/12 0650 01/04/12 0540  HGB -- 10.3*  HCT -- 30.2*  PLT -- 129*  APTT -- --  LABPROT 15.4* 15.6*  INR 1.19 1.21  HEPARINUNFRC -- --  CREATININE 0.76 --  CKTOTAL -- --  CKMB -- --  TROPONINI -- --   Estimated Creatinine Clearance: 53.6 ml/min (by C-G formula based on Cr of 0.76).  Assessment:   Coumadin begun 1/10 with 4 mg x 1.   Coumadin 2.5 mg to be given 1/11, per Dr. Laneta Simmers.   Conservative dosing with concurrent Amiodarone & age.  Goal of Therapy:  INR 2-3   Plan:    Coumadin 2.5 mg today.   PT/INR daily while in the hospital.   Husband relates also taking Coumadin & Amiodarone, so they are familiar with the monitoring, precautions and potential drug & food interactions.  Woodfin Ganja Pager:  [phone] 01/05/2012,11:07 AM

## 2012-01-06 DIAGNOSIS — I4892 Unspecified atrial flutter: Secondary | ICD-10-CM | POA: Diagnosis not present

## 2012-01-06 DIAGNOSIS — I4891 Unspecified atrial fibrillation: Secondary | ICD-10-CM | POA: Diagnosis not present

## 2012-01-06 LAB — GLUCOSE, CAPILLARY
Glucose-Capillary: 107 mg/dL — ABNORMAL HIGH (ref 70–99)
Glucose-Capillary: 108 mg/dL — ABNORMAL HIGH (ref 70–99)
Glucose-Capillary: 116 mg/dL — ABNORMAL HIGH (ref 70–99)
Glucose-Capillary: 134 mg/dL — ABNORMAL HIGH (ref 70–99)

## 2012-01-06 LAB — PROTIME-INR
INR: 1.17 (ref 0.00–1.49)
Prothrombin Time: 15.1 seconds (ref 11.6–15.2)

## 2012-01-06 MED ORDER — POTASSIUM CHLORIDE CRYS ER 20 MEQ PO TBCR: 20.0000 meq | EXTENDED_RELEASE_TABLET | Freq: Every day | ORAL | Status: DC

## 2012-01-06 MED ORDER — WARFARIN SODIUM 4 MG PO TABS
4.0000 mg | ORAL_TABLET | Freq: Once | ORAL | Status: AC
  Administered 2012-01-06: 4 mg via ORAL
  Filled 2012-01-06: qty 1

## 2012-01-06 MED ORDER — FUROSEMIDE 40 MG PO TABS
40.0000 mg | ORAL_TABLET | Freq: Two times a day (BID) | ORAL | Status: DC
  Administered 2012-01-07: 40 mg via ORAL
  Filled 2012-01-06 (×3): qty 1

## 2012-01-06 MED ORDER — METOPROLOL TARTRATE 50 MG PO TABS: 50.0000 mg | ORAL_TABLET | Freq: Two times a day (BID) | ORAL | Status: DC

## 2012-01-06 MED ORDER — METOPROLOL TARTRATE 25 MG PO TABS: 50.0000 mg | ORAL_TABLET | Freq: Two times a day (BID) | ORAL | Status: DC

## 2012-01-06 MED ORDER — METOLAZONE 5 MG PO TABS
5.0000 mg | ORAL_TABLET | Freq: Every day | ORAL | Status: AC
  Administered 2012-01-06 – 2012-01-07 (×2): 5 mg via ORAL
  Filled 2012-01-06 (×2): qty 1

## 2012-01-06 MED ORDER — AMIODARONE HCL 200 MG PO TABS: 400.0000 mg | ORAL_TABLET | Freq: Every day | ORAL | Status: DC

## 2012-01-06 MED ORDER — FUROSEMIDE 10 MG/ML IJ SOLN
40.0000 mg | Freq: Once | INTRAMUSCULAR | Status: AC
  Administered 2012-01-06: 40 mg via INTRAVENOUS
  Filled 2012-01-06: qty 4

## 2012-01-06 MED ORDER — FUROSEMIDE 40 MG PO TABS: 40.0000 mg | ORAL_TABLET | Freq: Every day | ORAL | Status: DC

## 2012-01-06 NOTE — Progress Notes (Addendum)
9 Days Post-Op Procedure(s) (LRB): AORTIC VALVE REPLACEMENT (AVR) (N/A) CORONARY ARTERY BYPASS GRAFTING (CABG) (N/A)  Subjective: Patient really wants to go home.  Objective: Vital signs in last 24 hours: Patient Vitals for the past 24 hrs:  BP Temp Temp src Pulse Resp SpO2  01/06/12 0500 127/73 mmHg 97.1 F (36.2 C) Oral 79  18  92 %  01/05/12 2115 127/70 mmHg 97.6 F (36.4 C) Oral 76  18  98 %  01/05/12 1558 104/54 mmHg - - 72  - -  01/05/12 1530 127/76 mmHg - - 75  - -  01/05/12 1500 121/72 mmHg - - 71  - 94 %  01/05/12 1300 108/69 mmHg - - 73  16  94 %   Pre op weight 66.7 kg Current Weight  01/05/12 164 lb 0.4 oz (74.4 kg)      Intake/Output from previous day: 01/12 0701 - 01/13 0700 In: 1560 [P.O.:1560] Out: 1050 [Urine:1050]   Physical Exam:  Cardiovascular: RRR, no murmurs, gallops, or rubs. Pulmonary: Slightly diminished at bases bilaterally; no rales, wheezes, or rhonchi. Abdomen: Soft, non tender, bowel sounds present. Extremities: 2+ bilateral lower extremity edema. Wounds: Sternal wound is clean and dry.  No erythema or signs of infection.Serosanginous oozing from bilateral leg wounds.  Lab Results: CBC:  Basename 01/04/12 0540  WBC 12.6*  HGB 10.3*  HCT 30.2*  PLT 129*   BMET:   Basename 01/05/12 0650  NA 128*  K 3.6  CL 90*  CO2 26  GLUCOSE 114*  BUN 26*  CREATININE 0.76  CALCIUM 9.0    PT/INR:   Basename 01/06/12 0600  LABPROT 15.1  INR 1.17   ABG:  INR: Will add last result for INR, ABG once components are confirmed Will add last 4 CBG results once components are confirmed  Assessment/Plan:  1. CV - PaFib/flutter. Maintaining SR/PVCs. Per Dr. Mayford Knife, will decrease Amiodarone to 400 daily upon discharge. Continue Lopressor 25 bid, and Coumadin (pharmacy dosing). 2.  Pulmonary - Encourage incentive spirometer.Had decreased O2 Sat so back on 1L via [place]. Check CXR am. 3. Volume Overload - Continue with bid diuresis. 4.  Acute  blood loss anemia - H/H stable at 10.3/30.2. 6.Hyponatremia-Sodium 128. Likely secondary to diuresis. 7.Mild thrombocytopenia-Platelets 129,000 8.Leukocytosis-WBC decreased 14.6 to 12.6. Remains afebrile, no signs of wound infection, and no GU complaints. 9.Need to decrease lower extremity edema and wean off O2 prior to discharge.   ZIMMERMAN,DONIELLE MPA-C 01/06/2012  01/06/2012   patient examined and medical record reviewed,agree with above note.  Will add zaroxvln to lasix next dose VAN TRIGT III,Ariele Vidrio 01/06/2012

## 2012-01-06 NOTE — Progress Notes (Signed)
Patient refused to walk stating" I just don't have the energy to walk.  Patient 02 sats 88/ra, 1L/02 applied via nasal cannula. Sats rechecked in 20 minutes. 02 sats 93/1L 02  Via nasal cannula.  Patient resting comfortably in chair.  Will contunue to monitor.

## 2012-01-06 NOTE — Discharge Summary (Signed)
Addendum Note to D/C Carol [name] is a 78 y.o. female who is S/P Procedure(s): AORTIC VALVE REPLACEMENT (AVR) CORONARY ARTERY BYPASS GRAFTING (CABG).  The patient remained hemodynamically stable. He did convert to sinus rhythm. Her Dr. Mayford Knife, we will decrease her amiodarone to 400 mg daily upon discharge. She continued to diurese but still had a fair amount of lower extremity edema. As a result, she was given Lasix IV as well as Zaroxolyn to  further aid her diuresis. Lobe stem in the wounds did have some serous sanguinous drainage but no erythema or other signs of infection. Her white blood cell count did decrease to 12,600 and she did remained afebrile.  Pending morning round evaluation, she may be surgically stable for discharge on 01/07/2012.  No changes to History, Physical Exam. Changes to her medications include Amiodarone 400 mg po daily for 2 weeks; then Amiodarone 200 mg po daily thereafter. Metoprolol Tartrate 50 mg po two times daily. Lasix 40 mg po daily and Potassium 20 meq po daily to be continued until instructed to stop.  Home health needs to draw PT/INR on Wednesday 01/09/2012 and fax results to Dr. York Spaniel office. Nikcole Eischeid M PA-C 01/06/2012 1:18 PM

## 2012-01-06 NOTE — Progress Notes (Signed)
ANTICOAGULATION CONSULT NOTE - Follow Up Consult  Pharmacy Consult for Coumadin Indication: atrial fibrillation  Allergies  Allergen Reactions  . Ibuprofen     Hx of GI bleed  . Tape Rash    Reaction:Blisters (can use paper tape)    Patient Measurements: Height: 5\' 4"  (162.6 cm) Weight: 164 lb 0.4 oz (74.4 kg) IBW/kg (Calculated) : 54.7   Vital Signs: Temp: 97.1 F (36.2 C) (01/13 0500) Temp src: Oral (01/13 0500) BP: 127/73 mmHg (01/13 0500) Pulse Rate: 79  (01/13 0500)  Labs:  Basename 01/06/12 0600 01/05/12 0650 01/04/12 0540  HGB -- -- 10.3*  HCT -- -- 30.2*  PLT -- -- 129*  APTT -- -- --  LABPROT 15.1 15.4* 15.6*  INR 1.17 1.19 1.21  HEPARINUNFRC -- -- --  CREATININE -- 0.76 --  CKTOTAL -- -- --  CKMB -- -- --  TROPONINI -- -- --   Estimated Creatinine Clearance: 53.6 ml/min (by C-G formula based on Cr of 0.76).  Assessment: Protime has decreased with smaller doses of coumadin.          Goal of Therapy:  INR 2-3   Plan:    Coumadin 4 mg today   PT/INR daily while in the hospital.   Husband relates also taking Coumadin & Amiodarone, so they are familiar with the monitoring, precautions and potential drug & food interactions.  Woodfin Ganja Pager:  [phone] 01/06/2012,11:27 AM

## 2012-01-06 NOTE — Progress Notes (Signed)
SUBJECTIVE:  Feels good and wants to go home  OBJECTIVE:   Vitals:   Filed Vitals:   01/05/12 1530 01/05/12 1558 01/05/12 2115 01/06/12 0500  BP: 127/76 104/54 127/70 127/73  Pulse: 75 72 76 79  Temp:   97.6 F (36.4 C) 97.1 F (36.2 C)  TempSrc:   Oral Oral  Resp:   18 18  Height:      Weight:      SpO2:   98% 92%   I&O's:   Intake/Output Summary (Last 24 hours) at 01/06/12 8119 Last data filed at 01/06/12 0100  Gross per 24 hour  Intake   1320 ml  Output    950 ml  Net    370 ml   TELEMETRY: Reviewed telemetry pt in NSR     PHYSICAL EXAM General: Well developed, well nourished, in no acute distress Head: Eyes PERRLA, No xanthomas.   Normal cephalic and atramatic  Lungs:   Clear bilaterally to auscultation and percussion. Heart:   HRRR S1 S2 Pulses are 2+ & equal.            No carotid bruit. No JVD.  No abdominal bruits. No femoral bruits. Abdomen: Bowel sounds are positive, abdomen soft and non-tender without masses  Extremities:   No clubbing, cyanosis or edema.  DP +1 Neuro: Alert and oriented X 3. Psych:  Good affect, responds appropriately   LABS: Basic Metabolic Panel:  Basename 01/05/12 0650  NA 128*  K 3.6  CL 90*  CO2 26  GLUCOSE 114*  BUN 26*  CREATININE 0.76  CALCIUM 9.0  MG --  PHOS --   CBC:  Basename 01/04/12 0540  WBC 12.6*  NEUTROABS --  HGB 10.3*  HCT 30.2*  MCV 87.8  PLT 129*   Coag Panel:   Lab Results  Component Value Date   INR 1.17 01/06/2012   INR 1.19 01/05/2012   INR 1.21 01/04/2012    RADIOLOGY: Dg Chest 2 View  01/02/2012  *RADIOLOGY REPORT*  Clinical Data: Follow up CABG and aortic valve replacement, weakness and shortness of breath  CHEST - 2 VIEW  Comparison: Portable chest x-ray of 01/01/2012  Findings: Aeration of the lungs has improved.  There are bilateral pleural effusions present with basilar atelectasis remaining. Nodular opacity in the periphery the left upper lung field with adjacent sutures appears  stable. Cardiomegaly is stable.  Median sternotomy sutures are noted.  An aortic valve replacement is present.  IMPRESSION: Improved aeration.  Small effusions and basilar atelectasis remain. Apparent postop opacity is unchanged in the periphery of the left upper lung field.  Original Report Authenticated By: Juline Patch, M.D.   Dg Chest 2 View  12/20/2011  *RADIOLOGY REPORT*  Clinical Data: Shortness of breath.  CHEST - 2 VIEW  Comparison: Radiographs 09/26/2011.  PET CT 11/20/2011.  Chest CT 07/13/2011.  Findings: The heart size and mediastinal contours are stable with mild cardiomegaly.  There are new small bilateral pleural effusions.  There is vascular congestion without overt pulmonary edema.  No confluent airspace opacity is seen.  The subpleural nodule in the left upper lobe appears stable compared with the mentioned priors above.  This is a new finding from 02/14/2010 radiographs.  IMPRESSION:  1.  Stable left upper lobe subpleural nodule compared with recent prior examinations. As noted on the PET CT, this has decreased in size from 07/13/2011. 2.  Cardiomegaly with vascular congestion and small bilateral pleural effusions.  Findings suggest mild congestive heart failure.  Original Report Authenticated By: Gerrianne Scale, M.D.   Dg Chest Port 1 View  12/31/2011  *RADIOLOGY REPORT*  Clinical Data: Atelectasis and effusions.  PORTABLE CHEST - 1 VIEW  Comparison: 01/06 and 12/29/2011  Findings: No pneumothorax. Sheath remains in the superior vena cava.  Improving slight atelectasis in the left upper lobe.  Slightly increased small right effusion.  Small left effusion with slight atelectasis at the base, unchanged.  Persistent chronic cardiomegaly.  Vascularity is normal.  IMPRESSION:  1.  Improving atelectasis in the left upper lung zone. 2.  Slight increase in small right effusion.  Original Report Authenticated By: Gwynn Burly, M.D.   Dg Chest Portable 1 View In Am  12/30/2011  *RADIOLOGY  REPORT*  Clinical Data: Coronary bypass, aortic valve replacement  PORTABLE CHEST - 1 VIEW  Comparison: 12/29/2011  Findings: Mediastinal drain, Swan-Ganz catheter and left chest tube all removed.  Right IJ vascular sheath remains in stable position. Coronary bypass changes and aortic valve replacement noted.  Heart remains enlarged with vascular congestion and left lower lobe atelectasis / consolidation.  No pneumothorax.  Stable focal left upper lobe opacity, status post left upper lobe wedge resection.  IMPRESSION: Stable postoperative portable chest exam.  No pneumothorax.  Original Report Authenticated By: Judie Petit. Ruel Favors, M.D.   Dg Chest Portable 1 View In Am  12/29/2011  *RADIOLOGY REPORT*  Clinical Data: Bypass surgery.  PORTABLE CHEST - 1 VIEW  Comparison: Chest x-ray 12/28/2011.  Findings: The endotracheal tube, right-sided chest tube and NG tubes have been removed.  The right IJ Swan-Ganz catheter is stable and the left-sided chest tube is stable.  No pneumothorax.  Mild vascular congestion and areas of atelectasis persist.  Slight improved left upper lobe opacity.  IMPRESSION: Removal of ET, NG and right-sided chest tubes. No pneumothorax. Mild vascular congestion and minimal areas of atelectasis.  Original Report Authenticated By: P. Loralie Champagne, M.D.   Dg Chest Portable 1 View  12/28/2011  *RADIOLOGY REPORT*  Clinical Data: Postop CABG and valve replacement  PORTABLE CHEST - 1 VIEW  Comparison: 12/20/2011  Findings: There is a Swan-Ganz catheter with tip in the main pulmonary artery.  The ET tube tip is above the carina.  Bilateral chest tubes are noted without evidence for pneumothorax.  Increased in the left upper lobe opacity which now measures approximately 4.5 cm.  IMPRESSION:  1.  Postoperative changes compatible with CABG procedure. 2.  Bilateral chest tubes without pneumothorax. 3.  Increase in left upper lobe opacity which may reflect postoperative change.  Original Report Authenticated  By: Rosealee Albee, M.D.   Dg Chest Port 1v Same Day  01/01/2012  *RADIOLOGY REPORT*  Clinical Data: Follow-up CABG.  PORTABLE CHEST - 1 VIEW SAME DAY  Comparison: 12/31/2011  Findings: Changes of CABG and valve replacement.  Cardiomegaly with vascular congestion.  Increasing interstitial prominence, likely edema.  There is bibasilar atelectasis and small bilateral effusions.  No pneumothorax.  IMPRESSION: Increasing interstitial prominence, likely edema.  Continued bibasilar atelectasis and small effusions.  Original Report Authenticated By: Cyndie Chime, M.D.      ASSESSMENT:  1. Recent AVR/CABG  2. Blood loss anemia  3. A fluttter now in NSR  4.  Diarrhea resolved   PLAN:   1.  Continue amiodarone load - ok to decrease to 400mg  daily for 2 weeks then 200mg  daily.   Quintella Reichert, MD  01/06/2012  9:09 AM

## 2012-01-07 ENCOUNTER — Inpatient Hospital Stay (HOSPITAL_COMMUNITY): Payer: Medicare Other

## 2012-01-07 DIAGNOSIS — Z954 Presence of other heart-valve replacement: Secondary | ICD-10-CM | POA: Diagnosis not present

## 2012-01-07 DIAGNOSIS — R079 Chest pain, unspecified: Secondary | ICD-10-CM | POA: Diagnosis not present

## 2012-01-07 DIAGNOSIS — J90 Pleural effusion, not elsewhere classified: Secondary | ICD-10-CM | POA: Diagnosis not present

## 2012-01-07 LAB — PROTIME-INR
INR: 1.29 (ref 0.00–1.49)
Prothrombin Time: 16.3 seconds — ABNORMAL HIGH (ref 11.6–15.2)

## 2012-01-07 LAB — BASIC METABOLIC PANEL
BUN: 26 mg/dL — ABNORMAL HIGH (ref 6–23)
CO2: 31 mEq/L (ref 19–32)
Calcium: 9.1 mg/dL (ref 8.4–10.5)
Chloride: 88 mEq/L — ABNORMAL LOW (ref 96–112)
Creatinine, Ser: 0.93 mg/dL (ref 0.50–1.10)
GFR calc Af Amer: 65 mL/min — ABNORMAL LOW (ref >90)
GFR calc non Af Amer: 56 mL/min — ABNORMAL LOW (ref >90)
Glucose, Bld: 113 mg/dL — ABNORMAL HIGH (ref 70–99)
Potassium: 3.7 mEq/L (ref 3.5–5.1)
Sodium: 129 mEq/L — ABNORMAL LOW (ref 135–145)

## 2012-01-07 LAB — GLUCOSE, CAPILLARY: Glucose-Capillary: 119 mg/dL — ABNORMAL HIGH (ref 70–99)

## 2012-01-07 MED ORDER — AMIODARONE HCL 200 MG PO TABS: 200.0000 mg | ORAL_TABLET | Freq: Two times a day (BID) | ORAL | Status: DC

## 2012-01-07 NOTE — Progress Notes (Signed)
Patient ID: Carol [name], female   DOB: 01-Mar-1928, 78 y.o.   MRN: [id number]                   [address]            [place] [address]          [phone]     10 Days Post-Op Procedure(s) (LRB): AORTIC VALVE REPLACEMENT (AVR) (N/A) CORONARY ARTERY BYPASS GRAFTING (CABG) (N/A)  LOS: 12 days   Subjective: Wants to go home, discussed keeping legs elevated  Objective: Vital signs in last 24 hours: Patient Vitals for the past 24 hrs:  BP Temp Temp src Pulse Resp SpO2 Weight  01/07/12 0358 91/47 mmHg 98.3 F (36.8 C) Oral 83  18  99 % 148 lb 8 oz (67.359 kg)  01/06/12 2114 103/59 mmHg 97.3 F (36.3 C) Oral 83  18  93 % -  01/06/12 1500 119/70 mmHg 97.3 F (36.3 C) - 80  18  93 % -   Wt Readings from Last 3 Encounters:  01/07/12 148 lb 8 oz (67.359 kg)  01/07/12 148 lb 8 oz (67.359 kg)  01/07/12 148 lb 8 oz (67.359 kg)        Intake/Output from previous day: 01/13 0701 - 01/14 0700 In: 720 [P.O.:720] Out: 2650 [Urine:2650] Intake/Output this shift:    Scheduled Meds:   . amiodarone  400 mg Oral BID  . aspirin EC  81 mg Oral Daily  . folic acid  1 mg Oral Daily  . furosemide  40 mg Intravenous Once  . furosemide  40 mg Oral BID  . insulin aspart  0-24 Units Subcutaneous TID AC & HS  . levothyroxine  75 mcg Oral QAC breakfast  . metolazone  5 mg Oral Daily  . metoprolol tartrate  50 mg Oral BID  . pantoprazole  40 mg Oral QAC breakfast  . potassium chloride  20 mEq Oral BID  . povidone-iodine  1 application Topical BID  . rosuvastatin  10 mg Oral q1800  . sodium chloride  3 mL Intravenous Q12H  . warfarin  4 mg Oral ONCE-1800  . DISCONTD: furosemide  40 mg Oral BID   Continuous Infusions:  PRN Meds:.sodium chloride, acetaminophen, ondansetron (ZOFRAN) IV, ondansetron, sodium chloride, temazepam, traMADol  General appearance: alert, cooperative and no distress Neurologic: intact Heart: regular rate and rhythm, S1, S2 normal, no murmur,  click, rub or gallop Lungs: diminished breath sounds bibasilar Abdomen: soft, non-tender; bowel sounds normal; no masses,  no organomegaly Extremities: still lower extremity edema and weeping from left lower leg staples but improved Wound: sternum stable  Lab Results: CBC:No results found for this basename: WBC:2,HGB:2,HCT:2,PLT:2 in the last 72 hours BMET:  [facility] 01/07/12 0552 01/05/12 0650  NA 129* 128*  K 3.7 3.6  CL 88* 90*  CO2 31 26  GLUCOSE 113* 114*  BUN 26* 26*  CREATININE 0.93 0.76  CALCIUM 9.1 9.0    PT/INR:  Basename 01/07/12 0552  LABPROT 16.3*  INR 1.29     Radiology Dg Chest 2 View  01/07/2012  *RADIOLOGY REPORT*  Clinical Data: Status post aortic valve replacement, chest pain  CHEST - 2 VIEW  Comparison: 01/02/2012  Findings: Left upper lobe pleural-based spiculated mass, previously evaluated, is stable.  Evidence of median sternotomy, CABG, and aortic valvuloplasty noted.  Slight increase in bilateral small pleural effusions with adjacent probable compressive atelectasis. No pneumothorax.  Mild enlargement of the cardiomediastinal silhouette is  stable.  Leftward curvature of the spine centered at L1 again noted.  Apparent interval removal of epicardial pacer wires.  IMPRESSION: Slight increase in small bilateral pleural effusions.  Otherwise stable exam.  Original Report Authenticated By: Harrel Lemon, M.D.     Assessment/Plan: S/P Procedure(s) (LRB): AORTIC VALVE REPLACEMENT (AVR) (N/A) CORONARY ARTERY BYPASS GRAFTING (CABG) (N/A) Legs improved, leave staples Home today Home  On coumadin, Home nurse to draw and Cardiology following protime   Delight Ovens MD 01/07/2012 8:17 AM

## 2012-01-07 NOTE — Progress Notes (Signed)
   CARE MANAGEMENT NOTE 01/07/2012  Patient:  MARQUETA, PULLEY   Account Number:  [id number]  Date Initiated:  01/01/2012  Documentation initiated by:  Avie Arenas  Subjective/Objective Assessment:   Post op cardiac surgery on 12-28-11.  Has Spouse     Action/Plan:   PTA, PT INDEPENDENT, LIVES WITH SPOUSE.   Anticipated DC Date:  01/03/2012   Anticipated DC Plan:  HOME W HOME HEALTH SERVICES      DC Planning Services  CM consult      [facility] Choice  HOME HEALTH   Choice offered to / List presented to:  C-1 Patient        HH arranged  HH-1 RN  HH-2 PT  HH-4 NURSE'S AIDE      HH agency  [facility]   Status of service:  Completed, signed off Medicare Important Message given?   (If response is "NO", the following Medicare IM given date fields will be blank) Date Medicare IM given:   Date Additional Medicare IM given:    Discharge Disposition:  HOME W HOME HEALTH SERVICES  Per UR Regulation:  Reviewed for med. necessity/level of care/duration of stay  Comments:  01/07/12 Barnet Benavides,RN,BSN 1100 PT DISCHARGING HOME TODAY WITH HUSBAND AND HOME HEALTH SERVICES AS ARRANGED.  [facility] AWARE OF DC HOME TODAY. Phone #[phone]   01/02/12 Katee Wentland,RN,BSN 1410 MET WITH PT AND HUSBAND TO DISCUSS DC PLANS.  HUSBAND TO PROVIDE 24HR CARE, AND DAUGHTER COMING IN FROM [place] ON SATURDAY FOR ONE WEEK.  WILL NEED HOME HEALTH FOLLOW UP AT DC.  RECOMMEND HHRN FOR RESTORATIVE CARE, HHPT, AND HH AIDE.  PT HAS RW, BSC, AND WHEELCHAIR, IF NEEDED AT HOME. REFERRAL TO [facility]BIE WITH [facility] FOR HOME HEALTH NEEDS. Phone #[phone]  01-01-12 1:55pm Johny Shears - [phone] UR completed.

## 2012-01-07 NOTE — Progress Notes (Signed)
Pt d/c's home  11:00 AM. All meds/ instructions/ dsg care instructions explained to patient per d/c instructions. Pt verbalized understanding. Family at bedside. Emergency numbers reviewed. Dimas Chyle R.

## 2012-01-08 DIAGNOSIS — Z483 Aftercare following surgery for neoplasm: Secondary | ICD-10-CM | POA: Diagnosis not present

## 2012-01-08 DIAGNOSIS — I2510 Atherosclerotic heart disease of native coronary artery without angina pectoris: Secondary | ICD-10-CM | POA: Diagnosis not present

## 2012-01-08 DIAGNOSIS — Z48812 Encounter for surgical aftercare following surgery on the circulatory system: Secondary | ICD-10-CM | POA: Diagnosis not present

## 2012-01-08 DIAGNOSIS — R269 Unspecified abnormalities of gait and mobility: Secondary | ICD-10-CM | POA: Diagnosis not present

## 2012-01-08 DIAGNOSIS — I10 Essential (primary) hypertension: Secondary | ICD-10-CM | POA: Diagnosis not present

## 2012-01-09 DIAGNOSIS — Z48812 Encounter for surgical aftercare following surgery on the circulatory system: Secondary | ICD-10-CM | POA: Diagnosis not present

## 2012-01-09 DIAGNOSIS — Z483 Aftercare following surgery for neoplasm: Secondary | ICD-10-CM | POA: Diagnosis not present

## 2012-01-09 DIAGNOSIS — R269 Unspecified abnormalities of gait and mobility: Secondary | ICD-10-CM | POA: Diagnosis not present

## 2012-01-09 DIAGNOSIS — I2510 Atherosclerotic heart disease of native coronary artery without angina pectoris: Secondary | ICD-10-CM | POA: Diagnosis not present

## 2012-01-09 DIAGNOSIS — I10 Essential (primary) hypertension: Secondary | ICD-10-CM | POA: Diagnosis not present

## 2012-01-10 DIAGNOSIS — R269 Unspecified abnormalities of gait and mobility: Secondary | ICD-10-CM | POA: Diagnosis not present

## 2012-01-10 DIAGNOSIS — Z483 Aftercare following surgery for neoplasm: Secondary | ICD-10-CM | POA: Diagnosis not present

## 2012-01-10 DIAGNOSIS — Z48812 Encounter for surgical aftercare following surgery on the circulatory system: Secondary | ICD-10-CM | POA: Diagnosis not present

## 2012-01-10 DIAGNOSIS — I10 Essential (primary) hypertension: Secondary | ICD-10-CM | POA: Diagnosis not present

## 2012-01-10 DIAGNOSIS — I2510 Atherosclerotic heart disease of native coronary artery without angina pectoris: Secondary | ICD-10-CM | POA: Diagnosis not present

## 2012-01-11 ENCOUNTER — Ambulatory Visit (INDEPENDENT_AMBULATORY_CARE_PROVIDER_SITE_OTHER): Payer: Medicare Other

## 2012-01-11 DIAGNOSIS — Z48812 Encounter for surgical aftercare following surgery on the circulatory system: Secondary | ICD-10-CM | POA: Diagnosis not present

## 2012-01-11 DIAGNOSIS — I2510 Atherosclerotic heart disease of native coronary artery without angina pectoris: Secondary | ICD-10-CM

## 2012-01-11 DIAGNOSIS — Z483 Aftercare following surgery for neoplasm: Secondary | ICD-10-CM | POA: Diagnosis not present

## 2012-01-11 DIAGNOSIS — Z4802 Encounter for removal of sutures: Secondary | ICD-10-CM

## 2012-01-11 DIAGNOSIS — Z951 Presence of aortocoronary bypass graft: Secondary | ICD-10-CM

## 2012-01-11 DIAGNOSIS — R269 Unspecified abnormalities of gait and mobility: Secondary | ICD-10-CM | POA: Diagnosis not present

## 2012-01-11 DIAGNOSIS — Z952 Presence of prosthetic heart valve: Secondary | ICD-10-CM

## 2012-01-11 DIAGNOSIS — I10 Essential (primary) hypertension: Secondary | ICD-10-CM | POA: Diagnosis not present

## 2012-01-11 NOTE — Progress Notes (Signed)
Removed every other staple from patients left lower leg vein harvest site, total #24 staples. Removed 5 staples from patients right lower leg harvest site. (no satisfactory vein could be located on the right side.) with no signs of infection. The right lower leg was red and edemas with serous drainage from incision sited. Dr Tyrone Sage checked incisions. The right leg was loosely wrapped with gauze and pt was instructed on care of site. She is sch'ed to see Dr Tyrone Sage on 01/17/12 and 01/24/12 for follow up.

## 2012-01-14 ENCOUNTER — Telehealth: Payer: Self-pay | Admitting: *Deleted

## 2012-01-14 DIAGNOSIS — R269 Unspecified abnormalities of gait and mobility: Secondary | ICD-10-CM | POA: Diagnosis not present

## 2012-01-14 DIAGNOSIS — I10 Essential (primary) hypertension: Secondary | ICD-10-CM | POA: Diagnosis not present

## 2012-01-14 DIAGNOSIS — Z483 Aftercare following surgery for neoplasm: Secondary | ICD-10-CM | POA: Diagnosis not present

## 2012-01-14 DIAGNOSIS — I2510 Atherosclerotic heart disease of native coronary artery without angina pectoris: Secondary | ICD-10-CM | POA: Diagnosis not present

## 2012-01-14 DIAGNOSIS — Z48812 Encounter for surgical aftercare following surgery on the circulatory system: Secondary | ICD-10-CM | POA: Diagnosis not present

## 2012-01-14 NOTE — Telephone Encounter (Signed)
Dr. Sherene Sires, I had a reminder for the pt to have cxr done by 01/16/12- according to records she had cxr done on 01/07/12  She also has rov with you scheduled for 01/18/12. Will she need another cxr at that time? Please advise, and if so I will call her and remind of appt date/time, thanks

## 2012-01-14 NOTE — Telephone Encounter (Signed)
Spoke with pt and reminded her of her appt 01/18/12 at 11 am. Pt verbalized understanding.

## 2012-01-14 NOTE — Telephone Encounter (Signed)
Message copied by Christen Butter on Mon Jan 14, 2012 12:18 PM ------      Message from: Christen Butter      Created: Tue Nov 20, 2011  3:54 PM       Needs followup cxr due bu 01-16-12

## 2012-01-14 NOTE — Telephone Encounter (Signed)
We just need to make sure she does keep f/u ov for 01/18/12

## 2012-01-16 ENCOUNTER — Other Ambulatory Visit: Payer: Self-pay | Admitting: Cardiothoracic Surgery

## 2012-01-16 DIAGNOSIS — Z48812 Encounter for surgical aftercare following surgery on the circulatory system: Secondary | ICD-10-CM | POA: Diagnosis not present

## 2012-01-16 DIAGNOSIS — R269 Unspecified abnormalities of gait and mobility: Secondary | ICD-10-CM | POA: Diagnosis not present

## 2012-01-16 DIAGNOSIS — D381 Neoplasm of uncertain behavior of trachea, bronchus and lung: Secondary | ICD-10-CM

## 2012-01-16 DIAGNOSIS — Z483 Aftercare following surgery for neoplasm: Secondary | ICD-10-CM | POA: Diagnosis not present

## 2012-01-16 DIAGNOSIS — I10 Essential (primary) hypertension: Secondary | ICD-10-CM | POA: Diagnosis not present

## 2012-01-16 DIAGNOSIS — I2510 Atherosclerotic heart disease of native coronary artery without angina pectoris: Secondary | ICD-10-CM | POA: Diagnosis not present

## 2012-01-17 ENCOUNTER — Encounter (HOSPITAL_COMMUNITY): Payer: Self-pay | Admitting: *Deleted

## 2012-01-17 ENCOUNTER — Other Ambulatory Visit: Payer: Self-pay

## 2012-01-17 ENCOUNTER — Inpatient Hospital Stay (HOSPITAL_COMMUNITY)
Admission: EM | Admit: 2012-01-17 | Discharge: 2012-01-21 | DRG: 292 | Disposition: A | Discharge status: Home Health | Payer: Medicare Other | Attending: Internal Medicine | Admitting: Internal Medicine

## 2012-01-17 ENCOUNTER — Encounter: Payer: Self-pay | Admitting: Internal Medicine

## 2012-01-17 ENCOUNTER — Emergency Department (HOSPITAL_COMMUNITY): Payer: Medicare Other

## 2012-01-17 ENCOUNTER — Ambulatory Visit: Payer: Self-pay | Admitting: Cardiothoracic Surgery

## 2012-01-17 DIAGNOSIS — I4891 Unspecified atrial fibrillation: Secondary | ICD-10-CM | POA: Diagnosis not present

## 2012-01-17 DIAGNOSIS — Z7901 Long term (current) use of anticoagulants: Secondary | ICD-10-CM

## 2012-01-17 DIAGNOSIS — I5031 Acute diastolic (congestive) heart failure: Secondary | ICD-10-CM | POA: Diagnosis not present

## 2012-01-17 DIAGNOSIS — E878 Other disorders of electrolyte and fluid balance, not elsewhere classified: Secondary | ICD-10-CM | POA: Diagnosis present

## 2012-01-17 DIAGNOSIS — T45515A Adverse effect of anticoagulants, initial encounter: Secondary | ICD-10-CM | POA: Diagnosis present

## 2012-01-17 DIAGNOSIS — Z79899 Other long term (current) drug therapy: Secondary | ICD-10-CM | POA: Diagnosis not present

## 2012-01-17 DIAGNOSIS — I509 Heart failure, unspecified: Secondary | ICD-10-CM

## 2012-01-17 DIAGNOSIS — D375 Neoplasm of uncertain behavior of rectum: Secondary | ICD-10-CM

## 2012-01-17 DIAGNOSIS — E785 Hyperlipidemia, unspecified: Secondary | ICD-10-CM | POA: Diagnosis present

## 2012-01-17 DIAGNOSIS — Z7982 Long term (current) use of aspirin: Secondary | ICD-10-CM

## 2012-01-17 DIAGNOSIS — R0602 Shortness of breath: Secondary | ICD-10-CM | POA: Diagnosis not present

## 2012-01-17 DIAGNOSIS — D649 Anemia, unspecified: Secondary | ICD-10-CM | POA: Diagnosis not present

## 2012-01-17 DIAGNOSIS — R918 Other nonspecific abnormal finding of lung field: Secondary | ICD-10-CM | POA: Diagnosis not present

## 2012-01-17 DIAGNOSIS — E873 Alkalosis: Secondary | ICD-10-CM | POA: Diagnosis present

## 2012-01-17 DIAGNOSIS — I517 Cardiomegaly: Secondary | ICD-10-CM | POA: Diagnosis not present

## 2012-01-17 DIAGNOSIS — Z951 Presence of aortocoronary bypass graft: Secondary | ICD-10-CM

## 2012-01-17 DIAGNOSIS — I110 Hypertensive heart disease with heart failure: Secondary | ICD-10-CM | POA: Diagnosis present

## 2012-01-17 DIAGNOSIS — I5033 Acute on chronic diastolic (congestive) heart failure: Secondary | ICD-10-CM | POA: Diagnosis present

## 2012-01-17 DIAGNOSIS — I5032 Chronic diastolic (congestive) heart failure: Secondary | ICD-10-CM

## 2012-01-17 DIAGNOSIS — I350 Nonrheumatic aortic (valve) stenosis: Secondary | ICD-10-CM

## 2012-01-17 DIAGNOSIS — Z87898 Personal history of other specified conditions: Secondary | ICD-10-CM

## 2012-01-17 DIAGNOSIS — R791 Abnormal coagulation profile: Secondary | ICD-10-CM

## 2012-01-17 DIAGNOSIS — J90 Pleural effusion, not elsewhere classified: Secondary | ICD-10-CM | POA: Diagnosis not present

## 2012-01-17 DIAGNOSIS — I359 Nonrheumatic aortic valve disorder, unspecified: Secondary | ICD-10-CM | POA: Diagnosis not present

## 2012-01-17 DIAGNOSIS — E039 Hypothyroidism, unspecified: Secondary | ICD-10-CM | POA: Diagnosis not present

## 2012-01-17 DIAGNOSIS — D6832 Hemorrhagic disorder due to extrinsic circulating anticoagulants: Secondary | ICD-10-CM | POA: Diagnosis present

## 2012-01-17 DIAGNOSIS — D72829 Elevated white blood cell count, unspecified: Secondary | ICD-10-CM | POA: Diagnosis present

## 2012-01-17 DIAGNOSIS — I25810 Atherosclerosis of coronary artery bypass graft(s) without angina pectoris: Secondary | ICD-10-CM | POA: Diagnosis not present

## 2012-01-17 DIAGNOSIS — I2510 Atherosclerotic heart disease of native coronary artery without angina pectoris: Secondary | ICD-10-CM

## 2012-01-17 DIAGNOSIS — E669 Obesity, unspecified: Secondary | ICD-10-CM | POA: Diagnosis present

## 2012-01-17 DIAGNOSIS — E871 Hypo-osmolality and hyponatremia: Secondary | ICD-10-CM

## 2012-01-17 DIAGNOSIS — M51369 Other intervertebral disc degeneration, lumbar region without mention of lumbar back pain or lower extremity pain: Secondary | ICD-10-CM

## 2012-01-17 DIAGNOSIS — I119 Hypertensive heart disease without heart failure: Secondary | ICD-10-CM

## 2012-01-17 DIAGNOSIS — M5136 Other intervertebral disc degeneration, lumbar region: Secondary | ICD-10-CM

## 2012-01-17 DIAGNOSIS — J984 Other disorders of lung: Secondary | ICD-10-CM | POA: Diagnosis not present

## 2012-01-17 DIAGNOSIS — Z952 Presence of prosthetic heart valve: Secondary | ICD-10-CM

## 2012-01-17 HISTORY — DX: Hypo-osmolality and hyponatremia: E87.1

## 2012-01-17 HISTORY — DX: Chronic diastolic (congestive) heart failure: I50.32

## 2012-01-17 LAB — BASIC METABOLIC PANEL
BUN: 13 mg/dL (ref 6–23)
CO2: 33 mEq/L — ABNORMAL HIGH (ref 19–32)
Calcium: 8.6 mg/dL (ref 8.4–10.5)
Chloride: 73 mEq/L — ABNORMAL LOW (ref 96–112)
Creatinine, Ser: 0.75 mg/dL (ref 0.50–1.10)
GFR calc Af Amer: 89 mL/min — ABNORMAL LOW (ref >90)
GFR calc non Af Amer: 77 mL/min — ABNORMAL LOW (ref >90)
Glucose, Bld: 96 mg/dL (ref 70–99)
Potassium: 3.4 mEq/L — ABNORMAL LOW (ref 3.5–5.1)
Sodium: 116 mEq/L — CL (ref 135–145)

## 2012-01-17 LAB — COMPREHENSIVE METABOLIC PANEL WITH GFR
ALT: 17 U/L (ref 0–35)
AST: 22 U/L (ref 0–37)
Albumin: 3.5 g/dL (ref 3.5–5.2)
Alkaline Phosphatase: 110 U/L (ref 39–117)
BUN: 14 mg/dL (ref 6–23)
CO2: 31 meq/L (ref 19–32)
Calcium: 8.6 mg/dL (ref 8.4–10.5)
Chloride: 71 meq/L — ABNORMAL LOW (ref 96–112)
Creatinine, Ser: 0.61 mg/dL (ref 0.50–1.10)
GFR calc Af Amer: >90 mL/min
GFR calc non Af Amer: 82 mL/min — ABNORMAL LOW
Glucose, Bld: 170 mg/dL — ABNORMAL HIGH (ref 70–99)
Potassium: 4.6 meq/L (ref 3.5–5.1)
Sodium: 110 meq/L — CL (ref 135–145)
Total Bilirubin: 1.2 mg/dL (ref 0.3–1.2)
Total Protein: 5.9 g/dL — ABNORMAL LOW (ref 6.0–8.3)

## 2012-01-17 LAB — MRSA PCR SCREENING: MRSA by PCR: NEGATIVE

## 2012-01-17 LAB — CARDIAC PANEL(CRET KIN+CKTOT+MB+TROPI)
CK, MB: 3.2 ng/mL (ref 0.3–4.0)
Relative Index: INVALID (ref 0.0–2.5)
Total CK: 66 U/L (ref 7–177)
Troponin I: <0.3 ng/mL (ref <0.30)

## 2012-01-17 LAB — CBC
HCT: 31.8 % — ABNORMAL LOW (ref 36.0–46.0)
Hemoglobin: 11.4 g/dL — ABNORMAL LOW (ref 12.0–15.0)
MCH: 29.2 pg (ref 26.0–34.0)
MCHC: 35.8 g/dL (ref 30.0–36.0)
MCV: 81.3 fL (ref 78.0–100.0)
Platelets: 257 10*3/uL (ref 150–400)
RBC: 3.91 MIL/uL (ref 3.87–5.11)
RDW: 17.7 % — ABNORMAL HIGH (ref 11.5–15.5)
WBC: 16.3 10*3/uL — ABNORMAL HIGH (ref 4.0–10.5)

## 2012-01-17 LAB — PROTIME-INR
INR: 5.17 (ref 0.00–1.49)
INR: 3.36 — ABNORMAL HIGH (ref 0.00–1.49)
Prothrombin Time: 48.4 seconds — ABNORMAL HIGH (ref 11.6–15.2)
Prothrombin Time: 34.5 seconds — ABNORMAL HIGH (ref 11.6–15.2)

## 2012-01-17 LAB — OSMOLALITY: Osmolality: 238 mOsm/kg — ABNORMAL LOW (ref 275–300)

## 2012-01-17 LAB — CREATININE, URINE, RANDOM: Creatinine, Urine: 6.93 mg/dL

## 2012-01-17 LAB — SODIUM, URINE, RANDOM: Sodium, Ur: 38 mEq/L

## 2012-01-17 LAB — PRO B NATRIURETIC PEPTIDE: Pro B Natriuretic peptide (BNP): 7080 pg/mL — ABNORMAL HIGH (ref 0–450)

## 2012-01-17 LAB — OSMOLALITY, URINE: Osmolality, Ur: 149 mOsm/kg — ABNORMAL LOW (ref 390–1090)

## 2012-01-17 LAB — APTT: aPTT: 56 s — ABNORMAL HIGH (ref 24–37)

## 2012-01-17 LAB — TSH: TSH: 4.652 u[IU]/mL — ABNORMAL HIGH (ref 0.350–4.500)

## 2012-01-17 MED ORDER — NITROGLYCERIN 0.4 MG SL SUBL
0.4000 mg | SUBLINGUAL_TABLET | SUBLINGUAL | Status: DC | PRN
  Administered 2012-01-17: 0.4 mg via SUBLINGUAL
  Filled 2012-01-17: qty 25

## 2012-01-17 MED ORDER — DIPHENHYDRAMINE HCL 25 MG PO CAPS
25.0000 mg | ORAL_CAPSULE | Freq: Every evening | ORAL | Status: DC | PRN
  Administered 2012-01-18 – 2012-01-20 (×3): 25 mg via ORAL
  Filled 2012-01-17 (×3): qty 1

## 2012-01-17 MED ORDER — OCUVITE PO TABS
1.0000 | ORAL_TABLET | Freq: Two times a day (BID) | ORAL | Status: DC
  Administered 2012-01-17 – 2012-01-21 (×9): 1 via ORAL
  Filled 2012-01-17 (×11): qty 1

## 2012-01-17 MED ORDER — SIMVASTATIN 20 MG PO TABS
20.0000 mg | ORAL_TABLET | Freq: Every day | ORAL | Status: DC
  Administered 2012-01-17 – 2012-01-20 (×4): 20 mg via ORAL
  Filled 2012-01-17 (×5): qty 1

## 2012-01-17 MED ORDER — LUTEIN 20 MG PO CAPS: 20.0000 mg | ORAL_CAPSULE | Freq: Every day | ORAL | Status: DC

## 2012-01-17 MED ORDER — ASPIRIN 81 MG PO CHEW
324.0000 mg | CHEWABLE_TABLET | Freq: Once | ORAL | Status: AC
  Administered 2012-01-17: 324 mg via ORAL
  Filled 2012-01-17: qty 4

## 2012-01-17 MED ORDER — SODIUM CHLORIDE 0.9 % IV SOLN
INTRAVENOUS | Status: DC
  Administered 2012-01-19: 10 mL/h via INTRAVENOUS

## 2012-01-17 MED ORDER — GABAPENTIN 300 MG PO CAPS
300.0000 mg | ORAL_CAPSULE | Freq: Three times a day (TID) | ORAL | Status: DC
  Filled 2012-01-17 (×3): qty 1

## 2012-01-17 MED ORDER — FUROSEMIDE 10 MG/ML IJ SOLN
40.0000 mg | Freq: Once | INTRAMUSCULAR | Status: AC
  Administered 2012-01-17: 40 mg via INTRAVENOUS
  Filled 2012-01-17: qty 4

## 2012-01-17 MED ORDER — NITROGLYCERIN IN D5W 200-5 MCG/ML-% IV SOLN
2.0000 ug/min | Freq: Once | INTRAVENOUS | Status: DC
  Filled 2012-01-17: qty 250

## 2012-01-17 MED ORDER — ASPIRIN EC 81 MG PO TBEC
81.0000 mg | DELAYED_RELEASE_TABLET | Freq: Every day | ORAL | Status: DC
  Administered 2012-01-17 – 2012-01-21 (×5): 81 mg via ORAL
  Filled 2012-01-17 (×5): qty 1

## 2012-01-17 MED ORDER — LEVOTHYROXINE SODIUM 75 MCG PO TABS
75.0000 ug | ORAL_TABLET | ORAL | Status: DC
  Administered 2012-01-17 – 2012-01-20 (×4): 75 ug via ORAL
  Filled 2012-01-17 (×6): qty 1

## 2012-01-17 MED ORDER — FOLIC ACID 1 MG PO TABS
1.0000 mg | ORAL_TABLET | Freq: Every day | ORAL | Status: DC
  Administered 2012-01-17 – 2012-01-21 (×5): 1 mg via ORAL
  Filled 2012-01-17 (×5): qty 1

## 2012-01-17 MED ORDER — AMIODARONE HCL 200 MG PO TABS
200.0000 mg | ORAL_TABLET | Freq: Every day | ORAL | Status: DC
  Filled 2012-01-17: qty 1

## 2012-01-17 MED ORDER — CHOLECALCIFEROL 10 MCG (400 UNIT) PO TABS
400.0000 [IU] | ORAL_TABLET | Freq: Every day | ORAL | Status: DC
  Administered 2012-01-17 – 2012-01-21 (×5): 400 [IU] via ORAL
  Filled 2012-01-17 (×5): qty 1

## 2012-01-17 MED ORDER — METOPROLOL TARTRATE 25 MG PO TABS
25.0000 mg | ORAL_TABLET | Freq: Two times a day (BID) | ORAL | Status: DC
  Administered 2012-01-18 – 2012-01-21 (×7): 25 mg via ORAL
  Filled 2012-01-17 (×11): qty 1

## 2012-01-17 MED ORDER — METOPROLOL TARTRATE 50 MG PO TABS
50.0000 mg | ORAL_TABLET | Freq: Two times a day (BID) | ORAL | Status: DC
  Filled 2012-01-17 (×2): qty 1

## 2012-01-17 MED ORDER — SODIUM CHLORIDE 0.9 % IJ SOLN
3.0000 mL | INTRAMUSCULAR | Status: DC | PRN
  Administered 2012-01-17: 02:00:00 via INTRAVENOUS

## 2012-01-17 MED ORDER — SODIUM CHLORIDE 0.9 % IJ SOLN
3.0000 mL | Freq: Two times a day (BID) | INTRAMUSCULAR | Status: DC
  Administered 2012-01-17 – 2012-01-18 (×2): 3 mL via INTRAVENOUS

## 2012-01-17 MED ORDER — FUROSEMIDE 10 MG/ML IJ SOLN
40.0000 mg | Freq: Two times a day (BID) | INTRAMUSCULAR | Status: DC
  Administered 2012-01-17: 40 mg via INTRAVENOUS
  Filled 2012-01-17 (×5): qty 4

## 2012-01-17 MED ORDER — AMIODARONE HCL 200 MG PO TABS
200.0000 mg | ORAL_TABLET | Freq: Two times a day (BID) | ORAL | Status: DC
  Administered 2012-01-17: 200 mg via ORAL
  Filled 2012-01-17 (×2): qty 1

## 2012-01-17 MED ORDER — SODIUM CHLORIDE 0.9 % IV SOLN
250.0000 mL | INTRAVENOUS | Status: DC | PRN
  Administered 2012-01-17: 1000 mL via INTRAVENOUS

## 2012-01-17 MED ORDER — ADULT MULTIVITAMIN W/MINERALS CH
1.0000 | ORAL_TABLET | Freq: Every day | ORAL | Status: DC
  Administered 2012-01-17 – 2012-01-21 (×6): 1 via ORAL
  Filled 2012-01-17 (×5): qty 1

## 2012-01-17 MED ORDER — POTASSIUM CHLORIDE CRYS ER 20 MEQ PO TBCR
20.0000 meq | EXTENDED_RELEASE_TABLET | Freq: Every day | ORAL | Status: DC
  Administered 2012-01-17 – 2012-01-21 (×5): 20 meq via ORAL
  Filled 2012-01-17 (×5): qty 1

## 2012-01-17 MED ORDER — VITAMIN C 500 MG PO TABS
500.0000 mg | ORAL_TABLET | Freq: Every day | ORAL | Status: DC
  Administered 2012-01-17 – 2012-01-21 (×5): 500 mg via ORAL
  Filled 2012-01-17 (×5): qty 1

## 2012-01-17 MED ORDER — FUROSEMIDE 10 MG/ML IJ SOLN: 40.0000 mg | Freq: Once | INTRAMUSCULAR | Status: DC

## 2012-01-17 NOTE — Progress Notes (Signed)
Utilization Review Completed.Carol [name] T12/03/2012   

## 2012-01-17 NOTE — Consult Note (Signed)
Admit date: 01/17/2012 Name: Carol [name] DOB:  May 16, 1928 MRN:  [id number] 77 y.o.  Referring Physician:  [facility] Primary Physician:   Dr. Johnella Moloney Primary Cardiologist:    Dr. Viann Fish  Reason for Consultation:    Congestive heart failure  IMPRESSIONS: 1. Dyspnea with mild volume overload following recent aortic valve placement and coronary bypass grafting 2. Severe hyponatremia possibly due to recent diarrhea as well as diuresis 3. Recent aortic valve replacement and three-vessel bypass grafting 4. Over anticoagulation with warfarin 5. Hypertensive heart disease  6. Hyperlipidemia 7. Previous atrial fibrillation treated with amiodarone therapy  RECOMMENDATION: Clinically the patient looks reasonably well. She had a recent episode of diarrhea that was percent dictated by magnesium citrate that she took at home and persisted. This may have something to do with hyponatremia. She appears to still be somewhat volume overloaded. At this time I think I would just completely stop her amiodarone and try to let her electrolytes correct themselves. Will relook at Echocardiogram.  HISTORY: This 78 year-old female was recently discharged from the hospital following aortic valve replacement for severe aortic stenosis as well as a wedge resection of her lung and coronary bypass grafting. She did have some volume overload as well as some wound issues of her leg and was discharged on warfarin. This past weekend she developed severe constipation to magnesium citrate and had a lot of diarrhea following that but the diarrhea abated as of yesterday. She had a protime checked yesterday and was found to be over anticoagulated with warfarin and had instructions to hold the warfarin. She was really not having much in the way of dyspnea. When she ate dinner last night she then developed fairly severe dyspnea and was brought to the emergency room by ambulance and was admitted with some  volume changes in her lungs as well as some edema. She was also found to be severely hyponatremic with a sodium of 110. She has had mild reduction in her appetite recently.    Past Medical History  Diagnosis Date  . Hyperlipidemia   . Hypothyroidism   . Spinal stenosis   . Lumbar disc disease   . Obesity   . Aortic stenosis   . Dyspnea   . Lung nodules     Sees Wert, thought to be benign  . Hypertensive heart disease without CHF   . Villous adenoma of rectum   . Coronary artery disease     Cath 1/13 left main      Past Surgical History  Procedure Date  . Partial colectomy 2008    Villous adenoma  . Hernia repair   . Lumbar laminectomy   . Cholecystectomy   . Cardiac catheterization 2013  . Aortic valve replacement 12/28/2011    Procedure: AORTIC VALVE REPLACEMENT (AVR);  Surgeon: Delight Ovens, MD;  Location: [facility] OR;  Service: Open Heart Surgery;  Laterality: N/A;  . Coronary artery bypass graft 12/28/2011    Procedure: CORONARY ARTERY BYPASS GRAFTING (CABG);  Surgeon: Delight Ovens, MD;  Location: [facility] OR;  Service: Open Heart Surgery;  Laterality: N/A;   Allergies:  is allergic to ibuprofen and tape.   Medications: Prior to Admission medications   Medication Sig Start Date End Date Taking? Authorizing Provider  amiodarone (PACERONE) 200 MG tablet Take 1 tablet (200 mg total) by mouth 2 (two) times daily. For 1weeks; then take Amiodarone 200 mg po daily thereafter. 01/07/12   Rowe Clack, PA  aspirin EC 81 MG  tablet Take 81 mg by mouth daily.      Historical Provider, MD  atorvastatin (LIPITOR) 20 MG tablet Take 20 mg by mouth at bedtime.      Historical Provider, MD  Calcium Carbonate-Vitamin D (CALCIUM 600 + D PO) Take 1 tablet by mouth 2 (two) times daily.      Historical Provider, MD  cholecalciferol (VITAMIN D) 400 UNITS TABS Take 400 Units by mouth daily.      Historical Provider, MD  folic acid (FOLVITE) 1 MG tablet Take 1 tablet (1 mg total) by mouth daily. For  one month then stop. 01/04/12 01/03/13  Donielle Margaretann Loveless, PA  furosemide (LASIX) 40 MG tablet Take 1 tablet (40 mg total) by mouth daily. 01/06/12 01/05/13  Donielle Margaretann Loveless, PA  gabapentin (NEURONTIN) 300 MG capsule Take 300 mg by mouth 3 (three) times daily.      Historical Provider, MD  levothyroxine (SYNTHROID, LEVOTHROID) 75 MCG tablet Take 75 mcg by mouth daily.      Historical Provider, MD  Lutein 20 MG CAPS Take 20 mg by mouth daily.      Historical Provider, MD  metoprolol tartrate (LOPRESSOR) 50 MG tablet Take 1 tablet (50 mg total) by mouth 2 (two) times daily. 01/06/12 01/05/13  Donielle Margaretann Loveless, PA  Multiple Vitamin (MULITIVITAMIN WITH MINERALS) TABS Take 1 tablet by mouth daily.      Historical Provider, MD  Multiple Vitamins-Minerals (OCUVITE PRESERVISION PO) Take 1 tablet by mouth 2 (two) times daily.      Historical Provider, MD  OVER THE COUNTER MEDICATION Take 1 tablet by mouth daily. OTC MEDICATION: COLON HEALTH     Historical Provider, MD  potassium chloride SA (K-DUR,KLOR-CON) 20 MEQ tablet Take 1 tablet (20 mEq total) by mouth daily. 01/06/12 01/05/13  Donielle Margaretann Loveless, PA  vitamin C (ASCORBIC ACID) 500 MG tablet Take 500 mg by mouth daily.      Historical Provider, MD  warfarin (COUMADIN) 5 MG tablet Take 1 tablet (5 mg total) by mouth daily. Or as directed by Dr. York Spaniel office. 01/04/12 01/03/13  Donielle Margaretann Loveless, PA    Family History: Family Status  Relation Status Death Age  . Sister Deceased     cancer  . Mother Deceased 24    cancer  . Father Deceased 35    myocardial infarction  . Sister Alive     Social History:   reports that she quit smoking about 33 years ago. Her smoking use included Cigarettes. She has a 5 pack-year smoking history. She has never used smokeless tobacco. She reports that she drinks about 4.2 ounces of alcohol per week. She reports that she does not use illicit drugs.   Review of Systems: Other than as noted above is  negative.  Physical Exam: Blood pressure 140/51, pulse 60, temperature 97.9 F (36.6 C), temperature source Tympanic, resp. rate 17, height 5\' 4"  (1.626 m), weight 68.6 kg (151 lb 3.8 oz), SpO2 100.00%.    General appearance: alert, appears stated age and no distress Head: Normocephalic, without obvious abnormality, atraumatic Eyes: negative, conjunctivae/corneas clear. PERRL, EOM's intact. Fundi benign. Neck: no adenopathy, no JVD, supple, symmetrical, trachea midline and Transmitted murmur into the neck versus bruit Lungs: diminished breath sounds bibasilar Heart: regular rate and rhythm, S1, S2 normal and systolic murmur: Systolic 1/6, medium pitch at 2nd left intercostal space Abdomen: soft, non-tender; bowel sounds normal; no masses,  no organomegaly Extremities: Bilateral edema and some venous insufficiency changes, previous saphenous  harvesting scars noted on left leg, left leg is wrapped. Skin: Skin color, texture, turgor normal. No rashes or lesions Neurologic: Grossly normal  Labs: Results for orders placed during the hospital encounter of 01/17/12 (from the past 24 hour(s))  PRO B NATRIURETIC PEPTIDE     Status: Abnormal   Collection Time   01/17/12  1:49 AM      Component Value Range   Pro B Natriuretic peptide (BNP) 7080.0 (*) 0 - 450 (pg/mL)  CBC     Status: Abnormal   Collection Time   01/17/12  1:49 AM      Component Value Range   WBC 16.3 (*) 4.0 - 10.5 (K/uL)   RBC 3.91  3.87 - 5.11 (MIL/uL)   Hemoglobin 11.4 (*) 12.0 - 15.0 (g/dL)   HCT 16.1 (*) 09.6 - 46.0 (%)   MCV 81.3  78.0 - 100.0 (fL)   MCH 29.2  26.0 - 34.0 (pg)   MCHC 35.8  30.0 - 36.0 (g/dL)   RDW 04.5 (*) 40.9 - 15.5 (%)   Platelets 257  150 - 400 (K/uL)  CARDIAC PANEL(CRET KIN+CKTOT+MB+TROPI)     Status: Normal   Collection Time   01/17/12  1:49 AM      Component Value Range   Total CK 66  7 - 177 (U/L)   CK, MB 3.2  0.3 - 4.0 (ng/mL)   Troponin I <0.30  <0.30 (ng/mL)   Relative Index RELATIVE  INDEX IS INVALID  0.0 - 2.5   COMPREHENSIVE METABOLIC PANEL     Status: Abnormal   Collection Time   01/17/12  1:49 AM      Component Value Range   Sodium 110 (*) 135 - 145 (mEq/L)   Potassium 4.6  3.5 - 5.1 (mEq/L)   Chloride 71 (*) 96 - 112 (mEq/L)   CO2 31  19 - 32 (mEq/L)   Glucose, Bld 170 (*) 70 - 99 (mg/dL)   BUN 14  6 - 23 (mg/dL)   Creatinine, Ser 8.11  0.50 - 1.10 (mg/dL)   Calcium 8.6  8.4 - 91.4 (mg/dL)   Total Protein 5.9 (*) 6.0 - 8.3 (g/dL)   Albumin 3.5  3.5 - 5.2 (g/dL)   AST 22  0 - 37 (U/L)   ALT 17  0 - 35 (U/L)   Alkaline Phosphatase 110  39 - 117 (U/L)   Total Bilirubin 1.2  0.3 - 1.2 (mg/dL)   GFR calc non Af Amer 82 (*) >90 (mL/min)   GFR calc Af Amer >90  >90 (mL/min)  PROTIME-INR     Status: Abnormal   Collection Time   01/17/12  1:49 AM      Component Value Range   Prothrombin Time 48.4 (*) 11.6 - 15.2 (seconds)   INR 5.17 (*) 0.00 - 1.49   APTT     Status: Abnormal   Collection Time   01/17/12  1:49 AM      Component Value Range   aPTT 56 (*) 24 - 37 (seconds)  MRSA PCR SCREENING     Status: Normal   Collection Time   01/17/12  6:54 AM      Component Value Range   MRSA by PCR NEGATIVE  NEGATIVE   SODIUM, URINE, RANDOM     Status: Normal   Collection Time   01/17/12  7:54 AM      Component Value Range   Sodium, Ur 38    CREATININE, URINE, RANDOM     Status:  Normal   Collection Time   01/17/12  7:54 AM      Component Value Range   Creatinine, Urine 6.93    PROTIME-INR     Status: Abnormal   Collection Time   01/17/12  9:50 AM      Component Value Range   Prothrombin Time 34.5 (*) 11.6 - 15.2 (seconds)   INR 3.36 (*) 0.00 - 1.49       Radiology: Changes consistent with volume overload, mild cardiomegaly  EKG: Normal sinus rhythm, nonspecific ST changes, occasional PVCs.   Signed:  Darden Palmer MD [facility]   Cardiology Consultant 01/17/2012, 12:15 PM

## 2012-01-17 NOTE — Plan of Care (Signed)
Problem: Consults Goal: General Medical Patient Education See Patient Education Module for specific education. Outcome: Completed/Met Date Met:  01/17/12 SOB, CHF, fluid overload

## 2012-01-17 NOTE — Progress Notes (Signed)
ANTICOAGULATION CONSULT NOTE - Initial Consult  Pharmacy Consult for heparin Indication: atrial fibrillation  Allergies  Allergen Reactions  . Ibuprofen     Hx of GI bleed  . Tape Rash    Reaction:Blisters (can use paper tape)   Vital Signs: Temp: 97.1 F (36.2 C) (01/24 0138) Temp src: Oral (01/24 0138) BP: 117/48 mmHg (01/24 0541) Pulse Rate: 60  (01/24 0541)  Labs:  Basename 01/17/12 0149  HGB 11.4*  HCT 31.8*  PLT 257  APTT 56*  LABPROT 48.4*  INR 5.17*  HEPARINUNFRC --  CREATININE 0.61  CKTOTAL 66  CKMB 3.2  TROPONINI <0.30   Medical History: Past Medical History  Diagnosis Date  . Hyperlipidemia   . Hypothyroidism   . Spinal stenosis   . Lumbar disc disease   . Obesity   . Aortic stenosis   . Dyspnea   . Lung nodules     Sees Wert, thought to be benign  . Hypertensive heart disease without CHF   . Villous adenoma of rectum   . Coronary artery disease     Cath 1/13 left main    Medications:  Prescriptions prior to admission  Medication Sig Dispense Refill  . amiodarone (PACERONE) 200 MG tablet Take 1 tablet (200 mg total) by mouth 2 (two) times daily. For 1weeks; then take Amiodarone 200 mg po daily thereafter.  56 tablet  1  . aspirin EC 81 MG tablet Take 81 mg by mouth daily.        Marland Kitchen atorvastatin (LIPITOR) 20 MG tablet Take 20 mg by mouth at bedtime.        . Calcium Carbonate-Vitamin D (CALCIUM 600 + D PO) Take 1 tablet by mouth 2 (two) times daily.        . cholecalciferol (VITAMIN D) 400 UNITS TABS Take 400 Units by mouth daily.        . folic acid (FOLVITE) 1 MG tablet Take 1 tablet (1 mg total) by mouth daily. For one month then stop.  30 tablet  0  . furosemide (LASIX) 40 MG tablet Take 1 tablet (40 mg total) by mouth daily.  30 tablet  0  . gabapentin (NEURONTIN) 300 MG capsule Take 300 mg by mouth 3 (three) times daily.        Marland Kitchen levothyroxine (SYNTHROID, LEVOTHROID) 75 MCG tablet Take 75 mcg by mouth daily.        . Lutein 20 MG CAPS  Take 20 mg by mouth daily.        . metoprolol tartrate (LOPRESSOR) 50 MG tablet Take 1 tablet (50 mg total) by mouth 2 (two) times daily.  30 tablet  1  . Multiple Vitamin (MULITIVITAMIN WITH MINERALS) TABS Take 1 tablet by mouth daily.        . Multiple Vitamins-Minerals (OCUVITE PRESERVISION PO) Take 1 tablet by mouth 2 (two) times daily.        Marland Kitchen OVER THE COUNTER MEDICATION Take 1 tablet by mouth daily. OTC MEDICATION: COLON HEALTH       . potassium chloride SA (K-DUR,KLOR-CON) 20 MEQ tablet Take 1 tablet (20 mEq total) by mouth daily.  30 tablet  0  . vitamin C (ASCORBIC ACID) 500 MG tablet Take 500 mg by mouth daily.        Marland Kitchen warfarin (COUMADIN) 5 MG tablet Take 1 tablet (5 mg total) by mouth daily. Or as directed by Dr. York Spaniel office.  30 tablet  1    Assessment: 78yo female to  continue Coumadin for post-op Afib during admission for HF exacerbation; admitted with supratherapeutic INR.  Goal of Therapy:  INR 2-3   Plan:  Will hold off on Coumadin for now and resume/adjust when INR<3.  Vit K not currently indicated unless signs of uncontrolled bleeding and/or INR continues to increase >9.  Colleen Can PharmD BCPS 01/17/2012,6:29 AM

## 2012-01-17 NOTE — Progress Notes (Signed)
TCTS DAILY PROGRESS NOTE                   [address]            [place] [address]          [phone]           Total Length of Stay:  LOS: 0 days   Subjective: Increased SOB last pm and came to ER S/p AVR CABG  Objective: Vital signs in last 24 hours: Temp:  [97.1 F (36.2 C)-97.5 F (36.4 C)] 97.3 F (36.3 C) (01/24 0800) Pulse Rate:  [56-70] 60  (01/24 1000) Cardiac Rhythm:  [-] Heart block (01/24 0800) Resp:  [14-28] 17  (01/24 0645) BP: (100-189)/(36-98) 122/45 mmHg (01/24 1000) SpO2:  [95 %-100 %] 100 % (01/24 0645) Weight:  [151 lb 3.8 oz (68.6 kg)] 151 lb 3.8 oz (68.6 kg) (01/24 0645)  Filed Weights   01/17/12 0645  Weight: 151 lb 3.8 oz (68.6 kg)    Weight change:    Hemodynamic parameters for last 24 hours:    Intake/Output from previous day: 01/23 0701 - 01/24 0700 In: 50 [I.V.:50] Out: 1250 [Urine:1250]  Intake/Output this shift: Total I/O In: 100 [I.V.:100] Out: 200 [Urine:200]  Current Meds: Scheduled Meds:   . amiodarone  200 mg Oral BID  . aspirin  324 mg Oral Once  . aspirin EC  81 mg Oral Daily  . beta carotene w/minerals  1 tablet Oral BID  . cholecalciferol  400 Units Oral Daily  . folic acid  1 mg Oral Daily  . furosemide  40 mg Intravenous Once  . furosemide  40 mg Intravenous Q12H  . gabapentin  300 mg Oral TID  . levothyroxine  75 mcg Oral Q0700  . metoprolol tartrate  25 mg Oral BID  . mulitivitamin with minerals  1 tablet Oral Daily  . nitroGLYCERIN  2-200 mcg/min Intravenous Once  . potassium chloride SA  20 mEq Oral Daily  . simvastatin  20 mg Oral q1800  . sodium chloride  3 mL Intravenous Q12H  . vitamin C  500 mg Oral Daily  . DISCONTD: furosemide  40 mg Intravenous Once  . DISCONTD: Lutein  20 mg Oral Daily  . DISCONTD: metoprolol tartrate  50 mg Oral BID   Continuous Infusions:   . sodium chloride     PRN Meds:.sodium chloride, sodium chloride, DISCONTD: nitroGLYCERIN  General  appearance: alert, cooperative and no distress Neurologic: intact Heart: regular rate and rhythm, S1, S2 normal, no murmur, click, rub or gallop Lungs: diminished breath sounds bibasilar Extremities: left leg improved but still some bil pedal edema  Lab Results: CBC: Basename 01/17/12 0149  WBC 16.3*  HGB 11.4*  HCT 31.8*  PLT 257   BMET:  Basename 01/17/12 0149  NA 110*  K 4.6  CL 71*  CO2 31  GLUCOSE 170*  BUN 14  CREATININE 0.61  CALCIUM 8.6    PT/INR:  Basename 01/17/12 0950  LABPROT 34.5*  INR 3.36*   Radiology: Dg Chest Portable 1 View  01/17/2012  *RADIOLOGY REPORT*  Clinical Data: Chest pain and shortness of breath.  PORTABLE CHEST - 1 VIEW  Comparison: Chest radiograph performed 01/07/2012  Findings: Small to moderate bilateral pleural effusions are noted, similar in appearance to the prior study.  Associated bibasilar airspace opacification is noted.  There is worsening vascular congestion and increased interstitial markings, raising question for underlying pulmonary edema.  There is  no evidence of pneumothorax.  The cardiomediastinal silhouette is enlarged; the patient is status post median sternotomy, with evidence of prior CABG.  An aortic valve replacement is noted.  Calcification is noted within the aortic arch.  No acute osseous abnormalities are seen.  IMPRESSION: Persistent small to moderate bilateral pleural effusions, with associated bibasilar airspace opacification; worsening vascular congestion, cardiomegaly and increased interstitial markings raise question for underlying pulmonary edema.  Original Report Authenticated By: Tonia Ghent, M.D.     Assessment/Plan: S/P   avr cabg Sever hyponatremia noted/ correct electrlytes Decrease Cordarone dose to 200 mg day Pro d/c . stables before dc r Delight Ovens MD  Beeper [phone] Office [phone] 01/17/2012 11:12 AM

## 2012-01-17 NOTE — ED Provider Notes (Signed)
History     CSN: [id number]  Arrival date & time 01/17/12  0121   First MD Initiated Contact with Patient 01/17/12 0138      Chief Complaint  Patient presents with  . Shortness of Breath  . Weakness    (Consider location/radiation/quality/duration/timing/severity/associated sxs/prior treatment) HPI Comments: The patient is an 78 year old female with a history of coronary artery disease status post CABG x3 on 01/05/2012, and a heart valve replacement on 12/20/2011. She is on anticoagulation, but had stopped her dose yesterday because she was found to be hypercoagulable. She reports that tonight, shortly after eating dinner around 6:30, she had onset of dyspnea, worse with exertion, worse with supine position, better with rest in an upright seated position. She denies any chest pain, palpitations, nausea and vomiting, or diaphoresis. She currently reports no dyspnea.  Patient is a 78 y.o. female presenting with shortness of breath and weakness. The history is provided by the patient, medical records, a relative and the spouse.  Shortness of Breath  The current episode started yesterday. The onset was gradual. The problem occurs occasionally. The problem has been gradually improving. The problem is moderate. Relieved by: Rest and sitting upright in a chair. The symptoms are aggravated by a supine position (Exertion). Associated symptoms include orthopnea and shortness of breath. Pertinent negatives include no chest pain, no chest pressure, no fever, no rhinorrhea, no sore throat, no stridor, no cough and no wheezing. She has had prior hospitalizations (The patient recently had a coronary artery bypass graft x3 on January 12 of this month, and she had a heart valve replacement surgery on December 27 of 2012). Past medical history comments: Congestive heart failure with an ejection fraction of 40-45%. She has been behaving normally. Urine output has been normal. Recently, medical care has been given  by a specialist (Recent CABG as reported above).  Weakness Primary symptoms do not include dizziness, fever, nausea or vomiting.  Additional symptoms include weakness.    Past Medical History  Diagnosis Date  . Hyperlipidemia   . Hypothyroidism   . Spinal stenosis   . Lumbar disc disease   . Obesity   . Aortic stenosis   . Dyspnea   . Lung nodules     Sees Wert, thought to be benign  . Hypertensive heart disease without CHF   . Villous adenoma of rectum   . Coronary artery disease     Cath 1/13 left main    Past Surgical History  Procedure Date  . Partial colectomy 2008    Villous adenoma  . Hernia repair   . Lumbar laminectomy   . Cholecystectomy   . Cardiac catheterization 2013  . Aortic valve replacement 12/28/2011    Procedure: AORTIC VALVE REPLACEMENT (AVR);  Surgeon: Delight Ovens, MD;  Location: [facility] OR;  Service: Open Heart Surgery;  Laterality: N/A;  . Coronary artery bypass graft 12/28/2011    Procedure: CORONARY ARTERY BYPASS GRAFTING (CABG);  Surgeon: Delight Ovens, MD;  Location: [facility] OR;  Service: Open Heart Surgery;  Laterality: N/A;    Family History  Problem Relation Age of Onset  . Lymphoma Sister   . Cancer Sister   . Cancer Mother   . Heart attack Father   . Heart disease Father     History  Substance Use Topics  . Smoking status: Former Smoker -- 0.5 packs/day for 10 years    Types: Cigarettes    Quit date: 12/24/1978  . Smokeless tobacco: Never Used  .  Alcohol Use: 4.2 oz/week    7 Glasses of wine per week     occ    OB History    Grav Para Term Preterm Abortions TAB SAB Ect Mult Living                  Review of Systems  Constitutional: Negative for fever, chills, appetite change, fatigue and unexpected weight change.  HENT: Negative for sore throat, facial swelling and rhinorrhea.   Eyes: Negative.   Respiratory: Positive for shortness of breath. Negative for cough, wheezing and stridor.   Cardiovascular: Positive for  orthopnea and leg swelling. Negative for chest pain and palpitations.  Gastrointestinal: Negative for nausea, vomiting, abdominal pain and abdominal distention.  Genitourinary: Negative.   Musculoskeletal: Positive for back pain.  Skin: Negative for color change, pallor, rash and wound.  Neurological: Positive for weakness. Negative for dizziness, syncope and light-headedness.  Hematological: Bruises/bleeds easily.  Psychiatric/Behavioral: Negative.     Allergies  Ibuprofen and Tape  Home Medications   Current Outpatient Rx  Name Route Sig Dispense Refill  . AMIODARONE HCL 200 MG PO TABS Oral Take 1 tablet (200 mg total) by mouth 2 (two) times daily. For 1weeks; then take Amiodarone 200 mg po daily thereafter. 56 tablet 1  . ASPIRIN EC 81 MG PO TBEC Oral Take 81 mg by mouth daily.      . ATORVASTATIN CALCIUM 20 MG PO TABS Oral Take 20 mg by mouth at bedtime.      Marland Kitchen CALCIUM 600 + D PO Oral Take 1 tablet by mouth 2 (two) times daily.      . CHOLECALCIFEROL 400 UNITS PO TABS Oral Take 400 Units by mouth daily.      Marland Kitchen FOLIC ACID 1 MG PO TABS Oral Take 1 tablet (1 mg total) by mouth daily. For one month then stop. 30 tablet 0  . FUROSEMIDE 40 MG PO TABS Oral Take 1 tablet (40 mg total) by mouth daily. 30 tablet 0  . GABAPENTIN 300 MG PO CAPS Oral Take 300 mg by mouth 3 (three) times daily.      Marland Kitchen LEVOTHYROXINE SODIUM 75 MCG PO TABS Oral Take 75 mcg by mouth daily.      . LUTEIN 20 MG PO CAPS Oral Take 20 mg by mouth daily.      Marland Kitchen METOPROLOL TARTRATE 50 MG PO TABS Oral Take 1 tablet (50 mg total) by mouth 2 (two) times daily. 30 tablet 1  . ADULT MULTIVITAMIN W/MINERALS CH Oral Take 1 tablet by mouth daily.      Idolina Primer PRESERVISION PO Oral Take 1 tablet by mouth 2 (two) times daily.      Marland Kitchen OVER THE COUNTER MEDICATION Oral Take 1 tablet by mouth daily. OTC MEDICATION: COLON HEALTH     . POTASSIUM CHLORIDE CRYS ER 20 MEQ PO TBCR Oral Take 1 tablet (20 mEq total) by mouth daily. 30 tablet 0    . VITAMIN C 500 MG PO TABS Oral Take 500 mg by mouth daily.      . WARFARIN SODIUM 5 MG PO TABS Oral Take 1 tablet (5 mg total) by mouth daily. Or as directed by Dr. York Spaniel office. 30 tablet 1    BP 107/46  Pulse 63  Temp(Src) 97.1 F (36.2 C) (Oral)  Resp 23  SpO2 99%  Physical Exam  Nursing note and vitals reviewed. Constitutional: She is oriented to person, place, and time. She appears well-nourished. No distress.  HENT:  Head: Normocephalic and atraumatic.  Nose: Nose normal.  Mouth/Throat: Oropharynx is clear and moist.  Eyes: EOM are normal. Pupils are equal, round, and reactive to light.  Neck: Normal range of motion. Neck supple. No JVD present. No tracheal deviation present.  Cardiovascular: Normal rate, regular rhythm and intact distal pulses.  Exam reveals no gallop and no friction rub.   No murmur heard. Pulmonary/Chest: Effort normal and breath sounds normal. No stridor. No respiratory distress. She has no wheezes. She has no rales.       Well-healing midline sternotomy scar without he has since or sign of infection  Abdominal: Soft. Bowel sounds are normal. She exhibits no distension. There is no tenderness. There is no rebound and no guarding.  Musculoskeletal: Normal range of motion. She exhibits edema. She exhibits no tenderness.       Right lower leg: She exhibits edema. She exhibits no tenderness.       Left lower leg: She exhibits edema. She exhibits no tenderness.  Lymphadenopathy:    She has no cervical adenopathy.  Neurological: She is alert and oriented to person, place, and time.  Skin: Skin is warm and dry. No rash noted. She is not diaphoretic. No erythema. No pallor.  Psychiatric: She has a normal mood and affect.    ED Course  Procedures (including critical care time)   Date: 01/17/2012  Rate: 70  Rhythm: normal sinus rhythm  QRS Axis: normal  Intervals: PR prolonged and Nonspecific intraventricular conduction delay  ST/T Wave abnormalities:  nonspecific ST/T changes  Conduction Disutrbances:first-degree A-V block   Narrative Interpretation: Abnormal EKG  Old EKG Reviewed: changes noted   Labs Reviewed  PRO B NATRIURETIC PEPTIDE - Abnormal; Notable for the following:    Pro B Natriuretic peptide (BNP) 7080.0 (*)    All other components within normal limits  CBC - Abnormal; Notable for the following:    WBC 16.3 (*)    Hemoglobin 11.4 (*)    HCT 31.8 (*)    RDW 17.7 (*)    All other components within normal limits  COMPREHENSIVE METABOLIC PANEL - Abnormal; Notable for the following:    Sodium 110 (*)    Chloride 71 (*)    Glucose, Bld 170 (*)    Total Protein 5.9 (*)    GFR calc non Af Amer 82 (*)    All other components within normal limits  PROTIME-INR - Abnormal; Notable for the following:    Prothrombin Time 48.4 (*)    INR 5.17 (*)    All other components within normal limits  APTT - Abnormal; Notable for the following:    aPTT 56 (*)    All other components within normal limits  CARDIAC PANEL(CRET KIN+CKTOT+MB+TROPI)   Dg Chest Portable 1 View  01/17/2012  *RADIOLOGY REPORT*  Clinical Data: Chest pain and shortness of breath.  PORTABLE CHEST - 1 VIEW  Comparison: Chest radiograph performed 01/07/2012  Findings: Small to moderate bilateral pleural effusions are noted, similar in appearance to the prior study.  Associated bibasilar airspace opacification is noted.  There is worsening vascular congestion and increased interstitial markings, raising question for underlying pulmonary edema.  There is no evidence of pneumothorax.  The cardiomediastinal silhouette is enlarged; the patient is status post median sternotomy, with evidence of prior CABG.  An aortic valve replacement is noted.  Calcification is noted within the aortic arch.  No acute osseous abnormalities are seen.  IMPRESSION: Persistent small to moderate bilateral pleural effusions, with associated  bibasilar airspace opacification; worsening vascular  congestion, cardiomegaly and increased interstitial markings raise question for underlying pulmonary edema.  Original Report Authenticated By: Tonia Ghent, M.D.     No diagnosis found.    MDM  The patient has apparent acute congestive heart failure with pulmonary vascular congestion and some pulmonary edema but appear to be the cause of her shortness of breath. She also has severe hyponatremia with a sodium of 110 and hypercoagulability with an INR greater than 5. She has no active bleeding. She does report some generalized weakness, I attribute this to the hyponatremia. I do perceive that the hyponatremia is a volume overload hyponatremia, likely with excessive free water retention. The patient will need admission to the hospital for further management.        Felisa Bonier, MD 01/17/12 [phone]

## 2012-01-17 NOTE — ED Notes (Signed)
Per EMS, PT had triple bypass in on 1/12 and heart valve replacement on 12/27. Pt started feeling sob at rest at 1830 last night while at rest. Pt stopped coumadin yesterday per MD instruction secondary to a high INR.

## 2012-01-17 NOTE — Progress Notes (Signed)
I have examined the patient and reviewed the chart. I agree with the above note.   Acasia Skilton MD [phone] 

## 2012-01-17 NOTE — ED Notes (Signed)
Dr. Doylene Canard notified of high INR

## 2012-01-17 NOTE — Progress Notes (Signed)
  Echocardiogram 2D Echocardiogram has been performed.  Juanita Laster Karly Pitter 01/17/2012, 2:06 PM

## 2012-01-17 NOTE — Progress Notes (Signed)
CRITICAL VALUE ALERT  Critical value received:  NA=116  Date of notification:  01/17/2012   Time of notification:  1530  Critical value read back: yes  Nurse who received alert:  Celesta Gentile   MD notified (1st page):  Haroldine Laws  Time of first page: 1530  MD notified (2nd page):  Time of second page:  Responding MD:  Haroldine Laws- no orders received  Time MD responded:  1530

## 2012-01-17 NOTE — Progress Notes (Signed)
This is a followup note to the admission history and physical exam. All laboratory data and diagnostics have been reviewed. This patient has been admitted with a diagnosis of heart failure and hyponatremia. Sodium and admission was 110. Patient was evaluated this morning and is alert and oriented. Stat electrolyte panel is pending. Urine osmolality and other indices were consistent with volume overload and therefore IV fluids were discontinued that were ordered at admission. INR was greater than 5 at admission and repeat INR down to just above 3. Since admission she has had heart rates in the 50-60 range and therefore her beta blocker was decreased by 50%. We have inserted a Foley catheter for accurate intake and output. We have also ordered urine and serum osmolality. Her chest x-ray was consistent with heart failure so we will continue current dose of Lasix. I have asked her cardiologist Dr. Arlyn Leak or one of his associates to see her in consultation today. I've notified cardiothoracic surgery for admission as well. Suspect that her improvement patient that her sodium has increased from 110.  Junious Silk, ANP [phone] [facility]

## 2012-01-17 NOTE — H&P (Signed)
PCP:   Hollice Espy, MD, MD   Chief Complaint: Shortness of breath, hyponatremia.  HPI: Carol [name] is an 78 y.o. female with history of coronary disease and critical aortic stenosis, status post recent CABG and pericardial tissues aVR, atrial fibrillation postop on Coumadin and amiodarone, left upper lobe lung resection for pulmonary nodule, history of hypothyroidism on supplement, history of congestive heart failure, discharge from the hospital on Lasix and Zaroxolyn, presents to the emergency room for progressive shortness of breath, orthopnea, and lower leg swelling. Evaluation in emergency room included a chest x-ray which showed vascular congestion, almost pulmonary edema, and residual small bilateral pleural effusions. Her baseline BNP was in the 400, it is now 7000. She also has supratherapeutic INR of 5.17, leukocytosis with a white count of 16,000, hemoglobin of 11.4 g per decaliter, and severe hyponatremic (110), hypochloremic (71) metabolic alkalosis with a bicarbonate of 30.   Rewiew of Systems:  The patient denies anorexia, fever, weight loss,, vision loss, decreased hearing, hoarseness, chest pain, syncope,   balance deficits, hemoptysis, abdominal pain, melena, hematochezia, severe indigestion/heartburn, hematuria, incontinence, genital sores, muscle weakness, suspicious skin lesions, transient blindness, difficulty walking, depression, unusual weight change, abnormal bleeding, enlarged lymph nodes, angioedema, and breast masses.    Past Medical History  Diagnosis Date  . Hyperlipidemia   . Hypothyroidism   . Spinal stenosis   . Lumbar disc disease   . Obesity   . Aortic stenosis   . Dyspnea   . Lung nodules     Sees Wert, thought to be benign  . Hypertensive heart disease without CHF   . Villous adenoma of rectum   . Coronary artery disease     Cath 1/13 left main    Past Surgical History  Procedure Date  . Partial colectomy 2008    Villous adenoma  .  Hernia repair   . Lumbar laminectomy   . Cholecystectomy   . Cardiac catheterization 2013  . Aortic valve replacement 12/28/2011    Procedure: AORTIC VALVE REPLACEMENT (AVR);  Surgeon: Delight Ovens, MD;  Location: [facility] OR;  Service: Open Heart Surgery;  Laterality: N/A;  . Coronary artery bypass graft 12/28/2011    Procedure: CORONARY ARTERY BYPASS GRAFTING (CABG);  Surgeon: Delight Ovens, MD;  Location: [facility] OR;  Service: Open Heart Surgery;  Laterality: N/A;    Medications:  HOME MEDS: Prior to Admission medications   Medication Sig Start Date End Date Taking? Authorizing Provider  amiodarone (PACERONE) 200 MG tablet Take 1 tablet (200 mg total) by mouth 2 (two) times daily. For 1weeks; then take Amiodarone 200 mg po daily thereafter. 01/07/12   Rowe Clack, PA  aspirin EC 81 MG tablet Take 81 mg by mouth daily.      Historical Provider, MD  atorvastatin (LIPITOR) 20 MG tablet Take 20 mg by mouth at bedtime.      Historical Provider, MD  Calcium Carbonate-Vitamin D (CALCIUM 600 + D PO) Take 1 tablet by mouth 2 (two) times daily.      Historical Provider, MD  cholecalciferol (VITAMIN D) 400 UNITS TABS Take 400 Units by mouth daily.      Historical Provider, MD  folic acid (FOLVITE) 1 MG tablet Take 1 tablet (1 mg total) by mouth daily. For one month then stop. 01/04/12 01/03/13  Donielle Margaretann Loveless, PA  furosemide (LASIX) 40 MG tablet Take 1 tablet (40 mg total) by mouth daily. 01/06/12 01/05/13  Donielle Margaretann Loveless, PA  gabapentin (NEURONTIN) 300  MG capsule Take 300 mg by mouth 3 (three) times daily.      Historical Provider, MD  levothyroxine (SYNTHROID, LEVOTHROID) 75 MCG tablet Take 75 mcg by mouth daily.      Historical Provider, MD  Lutein 20 MG CAPS Take 20 mg by mouth daily.      Historical Provider, MD  metoprolol tartrate (LOPRESSOR) 50 MG tablet Take 1 tablet (50 mg total) by mouth 2 (two) times daily. 01/06/12 01/05/13  Donielle Margaretann Loveless, PA  Multiple Vitamin (MULITIVITAMIN  WITH MINERALS) TABS Take 1 tablet by mouth daily.      Historical Provider, MD  Multiple Vitamins-Minerals (OCUVITE PRESERVISION PO) Take 1 tablet by mouth 2 (two) times daily.      Historical Provider, MD  OVER THE COUNTER MEDICATION Take 1 tablet by mouth daily. OTC MEDICATION: COLON HEALTH     Historical Provider, MD  potassium chloride SA (K-DUR,KLOR-CON) 20 MEQ tablet Take 1 tablet (20 mEq total) by mouth daily. 01/06/12 01/05/13  Donielle Margaretann Loveless, PA  vitamin C (ASCORBIC ACID) 500 MG tablet Take 500 mg by mouth daily.      Historical Provider, MD  warfarin (COUMADIN) 5 MG tablet Take 1 tablet (5 mg total) by mouth daily. Or as directed by Dr. York Spaniel office. 01/04/12 01/03/13  Donielle Margaretann Loveless, PA     Allergies:  Allergies  Allergen Reactions  . Ibuprofen     Hx of GI bleed  . Tape Rash    Reaction:Blisters (can use paper tape)    Social History:   reports that she quit smoking about 33 years ago. Her smoking use included Cigarettes. She has a 5 pack-year smoking history. She has never used smokeless tobacco. She reports that she drinks about 4.2 ounces of alcohol per week. She reports that she does not use illicit drugs.  Family History: Family History  Problem Relation Age of Onset  . Lymphoma Sister   . Cancer Sister   . Cancer Mother   . Heart attack Father   . Heart disease Father      Physical Exam: Filed Vitals:   01/17/12 0330 01/17/12 0345 01/17/12 0400 01/17/12 0430  BP: 138/53 141/62 153/57 153/61  Pulse: 63 61 63 61  Temp:      TempSrc:      Resp: 22 22 21 21   SpO2: 98% 99% 100% 99%   Blood pressure 153/61, pulse 61, temperature 97.1 F (36.2 C), temperature source Oral, resp. rate 21, SpO2 99.00%.  GEN:  Pleasant person lying in the stretcher in no acute distress; cooperative with exam PSYCH:  alert and oriented x4; does not appear anxious does not appear depressed; affect is normal HEENT: Mucous membranes pink and anicteric; PERRLA; EOM intact;  no cervical lymphadenopathy nor thyromegaly or carotid bruit; no JVD; Breasts:: Not examined CHEST WALL: Bypass wounds well healed.  CHEST: Normal respiration, clear to auscultation bilaterally HEART: Regular rate and rhythm; no murmurs rubs or gallops, no mechanical sound. BACK: No kyphosis or scoliosis; no CVA tenderness ABDOMEN: Obese, soft non-tender; no masses, no organomegaly, normal abdominal bowel sounds; no pannus; no intertriginous candida. Rectal Exam: Not done EXTREMITIES: No bone or joint deformity; age-appropriate arthropathy of the hands and knees; bilateral pedal edema, harvest site well healed with no evidence of infection.  Genitalia: not examined PULSES: 2+ and symmetric SKIN: Normal hydration no rash or ulceration CNS: Cranial nerves 2-12 grossly intact no focal neurologic deficit   Labs & Imaging Results for orders placed during the  hospital encounter of 01/17/12 (from the past 48 hour(s))  PRO B NATRIURETIC PEPTIDE     Status: Abnormal   Collection Time   01/17/12  1:49 AM      Component Value Range Comment   Pro B Natriuretic peptide (BNP) 7080.0 (*) 0 - 450 (pg/mL)   CBC     Status: Abnormal   Collection Time   01/17/12  1:49 AM      Component Value Range Comment   WBC 16.3 (*) 4.0 - 10.5 (K/uL)    RBC 3.91  3.87 - 5.11 (MIL/uL)    Hemoglobin 11.4 (*) 12.0 - 15.0 (g/dL)    HCT 65.7 (*) 84.6 - 46.0 (%)    MCV 81.3  78.0 - 100.0 (fL)    MCH 29.2  26.0 - 34.0 (pg)    MCHC 35.8  30.0 - 36.0 (g/dL)    RDW 96.2 (*) 95.2 - 15.5 (%)    Platelets 257  150 - 400 (K/uL)   CARDIAC PANEL(CRET KIN+CKTOT+MB+TROPI)     Status: Normal   Collection Time   01/17/12  1:49 AM      Component Value Range Comment   Total CK 66  7 - 177 (U/L)    CK, MB 3.2  0.3 - 4.0 (ng/mL)    Troponin I <0.30  <0.30 (ng/mL)    Relative Index RELATIVE INDEX IS INVALID  0.0 - 2.5    COMPREHENSIVE METABOLIC PANEL     Status: Abnormal   Collection Time   01/17/12  1:49 AM      Component Value  Range Comment   Sodium 110 (*) 135 - 145 (mEq/L)    Potassium 4.6  3.5 - 5.1 (mEq/L)    Chloride 71 (*) 96 - 112 (mEq/L)    CO2 31  19 - 32 (mEq/L)    Glucose, Bld 170 (*) 70 - 99 (mg/dL)    BUN 14  6 - 23 (mg/dL)    Creatinine, Ser 8.41  0.50 - 1.10 (mg/dL)    Calcium 8.6  8.4 - 10.5 (mg/dL)    Total Protein 5.9 (*) 6.0 - 8.3 (g/dL)    Albumin 3.5  3.5 - 5.2 (g/dL)    AST 22  0 - 37 (U/L)    ALT 17  0 - 35 (U/L)    Alkaline Phosphatase 110  39 - 117 (U/L)    Total Bilirubin 1.2  0.3 - 1.2 (mg/dL)    GFR calc non Af Amer 82 (*) >90 (mL/min)    GFR calc Af Amer >90  >90 (mL/min)   PROTIME-INR     Status: Abnormal   Collection Time   01/17/12  1:49 AM      Component Value Range Comment   Prothrombin Time 48.4 (*) 11.6 - 15.2 (seconds)    INR 5.17 (*) 0.00 - 1.49    APTT     Status: Abnormal   Collection Time   01/17/12  1:49 AM      Component Value Range Comment   aPTT 56 (*) 24 - 37 (seconds)    Dg Chest Portable 1 View  01/17/2012  *RADIOLOGY REPORT*  Clinical Data: Chest pain and shortness of breath.  PORTABLE CHEST - 1 VIEW  Comparison: Chest radiograph performed 01/07/2012  Findings: Small to moderate bilateral pleural effusions are noted, similar in appearance to the prior study.  Associated bibasilar airspace opacification is noted.  There is worsening vascular congestion and increased interstitial markings, raising question for underlying pulmonary edema.  There is no evidence of pneumothorax.  The cardiomediastinal silhouette is enlarged; the patient is status post median sternotomy, with evidence of prior CABG.  An aortic valve replacement is noted.  Calcification is noted within the aortic arch.  No acute osseous abnormalities are seen.  IMPRESSION: Persistent small to moderate bilateral pleural effusions, with associated bibasilar airspace opacification; worsening vascular congestion, cardiomegaly and increased interstitial markings raise question for underlying pulmonary edema.   Original Report Authenticated By: Tonia Ghent, M.D.      Assessment Present on Admission:  .CHF (congestive heart failure) .Hyponatremia .Hypothyroidism .CAD (coronary artery disease) .Warfarin-induced coagulopathy   PLAN: Will admit her to the step down because of the degree of hyponatremia. She likely was over diuresed, with contraction metabolic alkalosis. Having said that, she is also in heart failure, evidence with elevated BNP over baseline, and chest x-ray with moderate vascular congestion. She also is supratherapeutic on her Coumadin. She has hypothyroidism, which can be worsened with amiodarone, and aggravates hyponatremia.  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 We need to diurese free water, and replace with normal saline. This is the reason why she is on IV normal saline and Lasix at the same time. Will need to rule out with serial CPKs and troponins, get an echo for heart, continue her medications include the IV nitroglycerin. For her INR, Coumadin will be resumed once in the therapeutic range. I will continue her amiodarone, although at some point, perhaps this should be discontinued because of his multiple side effects. Overall, she does quite well considering she underwent major surgery recently. She is stable, full code, and will be admitted to [facility].  Other plans as per orders.   Eilam Shrewsbury 01/17/2012, 4:43 AM

## 2012-01-18 ENCOUNTER — Ambulatory Visit: Payer: Medicare Other | Admitting: Internal Medicine

## 2012-01-18 DIAGNOSIS — I5031 Acute diastolic (congestive) heart failure: Secondary | ICD-10-CM | POA: Diagnosis not present

## 2012-01-18 DIAGNOSIS — I25810 Atherosclerosis of coronary artery bypass graft(s) without angina pectoris: Secondary | ICD-10-CM | POA: Diagnosis not present

## 2012-01-18 DIAGNOSIS — I509 Heart failure, unspecified: Secondary | ICD-10-CM | POA: Diagnosis not present

## 2012-01-18 DIAGNOSIS — E871 Hypo-osmolality and hyponatremia: Secondary | ICD-10-CM | POA: Diagnosis not present

## 2012-01-18 DIAGNOSIS — Z952 Presence of prosthetic heart valve: Secondary | ICD-10-CM | POA: Diagnosis not present

## 2012-01-18 LAB — BASIC METABOLIC PANEL
BUN: 12 mg/dL (ref 6–23)
CO2: 35 mEq/L — ABNORMAL HIGH (ref 19–32)
Calcium: 8.4 mg/dL (ref 8.4–10.5)
Chloride: 76 mEq/L — ABNORMAL LOW (ref 96–112)
Creatinine, Ser: 0.74 mg/dL (ref 0.50–1.10)
GFR calc Af Amer: 89 mL/min — ABNORMAL LOW (ref >90)
GFR calc non Af Amer: 77 mL/min — ABNORMAL LOW (ref >90)
Glucose, Bld: 124 mg/dL — ABNORMAL HIGH (ref 70–99)
Potassium: 3.3 mEq/L — ABNORMAL LOW (ref 3.5–5.1)
Sodium: 119 mEq/L — CL (ref 135–145)

## 2012-01-18 LAB — PROTIME-INR
INR: 2.23 — ABNORMAL HIGH (ref 0.00–1.49)
Prothrombin Time: 25.1 seconds — ABNORMAL HIGH (ref 11.6–15.2)

## 2012-01-18 LAB — CBC
HCT: 30.8 % — ABNORMAL LOW (ref 36.0–46.0)
Hemoglobin: 10.6 g/dL — ABNORMAL LOW (ref 12.0–15.0)
MCH: 28.9 pg (ref 26.0–34.0)
MCHC: 34.4 g/dL (ref 30.0–36.0)
MCV: 83.9 fL (ref 78.0–100.0)
Platelets: 205 10*3/uL (ref 150–400)
RBC: 3.67 MIL/uL — ABNORMAL LOW (ref 3.87–5.11)
RDW: 17.7 % — ABNORMAL HIGH (ref 11.5–15.5)
WBC: 12 10*3/uL — ABNORMAL HIGH (ref 4.0–10.5)

## 2012-01-18 MED ORDER — WARFARIN SODIUM 5 MG PO TABS
5.0000 mg | ORAL_TABLET | Freq: Once | ORAL | Status: AC
  Filled 2012-01-18: qty 1

## 2012-01-18 MED ORDER — DOCUSATE SODIUM 100 MG PO CAPS
100.0000 mg | ORAL_CAPSULE | Freq: Two times a day (BID) | ORAL | Status: DC
  Administered 2012-01-18 – 2012-01-21 (×6): 100 mg via ORAL
  Filled 2012-01-18 (×7): qty 1

## 2012-01-18 MED ORDER — FUROSEMIDE 10 MG/ML IJ SOLN
40.0000 mg | Freq: Every day | INTRAMUSCULAR | Status: DC
  Administered 2012-01-19 – 2012-01-21 (×3): 40 mg via INTRAVENOUS
  Filled 2012-01-18 (×3): qty 4

## 2012-01-18 MED ORDER — MAGNESIUM HYDROXIDE 400 MG/5ML PO SUSP
15.0000 mL | Freq: Every day | ORAL | Status: DC | PRN
  Administered 2012-01-18: 15 mL via ORAL
  Filled 2012-01-18: qty 30

## 2012-01-18 NOTE — Progress Notes (Signed)
Patient ID: Carol [name], female   DOB: [birth year], 78 y.o.   MRN: [id number] TCTS DAILY PROGRESS NOTE                   [address]            [place] [address]          [phone]           Total Length of Stay:  LOS: 1 day   Subjective: Much improved comfortable breathing , needs to ambulate  Objective: Vital signs in last 24 hours: Temp:  [97.3 F (36.3 C)-97.9 F (36.6 C)] 97.9 F (36.6 C) (01/25 1238) Pulse Rate:  [59-78] 68  (01/25 1238) Cardiac Rhythm:  [-] Normal sinus rhythm (01/25 1145) Resp:  [13-23] 16  (01/25 1238) BP: (89-138)/(37-63) 136/49 mmHg (01/25 1238) SpO2:  [91 %-98 %] 93 % (01/25 1238) Weight:  [150 lb 9.2 oz (68.3 kg)] 150 lb 9.2 oz (68.3 kg) (01/25 0430)  Filed Weights   01/17/12 0645 01/18/12 0430  Weight: 151 lb 3.8 oz (68.6 kg) 150 lb 9.2 oz (68.3 kg)    Weight change: -10.6 oz (-0.3 kg)   Hemodynamic parameters for last 24 hours:    Intake/Output from previous day: 01/24 0701 - 01/25 0700 In: 482 [P.O.:240; I.V.:242] Out: 2450 [Urine:2450]  Intake/Output this shift: Total I/O In: 360 [P.O.:360] Out: -   Current Meds: Scheduled Meds:   . aspirin EC  81 mg Oral Daily  . beta carotene w/minerals  1 tablet Oral BID  . cholecalciferol  400 Units Oral Daily  . docusate sodium  100 mg Oral BID  . folic acid  1 mg Oral Daily  . furosemide  40 mg Intravenous Daily  . levothyroxine  75 mcg Oral Q0700  . metoprolol tartrate  25 mg Oral BID  . mulitivitamin with minerals  1 tablet Oral Daily  . nitroGLYCERIN  2-200 mcg/min Intravenous Once  . potassium chloride SA  20 mEq Oral Daily  . simvastatin  20 mg Oral q1800  . sodium chloride  3 mL Intravenous Q12H  . vitamin C  500 mg Oral Daily  . warfarin  5 mg Oral ONCE-1800  . DISCONTD: furosemide  40 mg Intravenous Q12H   Continuous Infusions:   . sodium chloride     PRN Meds:.sodium chloride, diphenhydrAMINE, magnesium hydroxide, sodium  chloride  General appearance: alert, cooperative and no distress Neurologic: intact Heart: regular rate and rhythm, S1, S2 normal, no murmur, click, rub or gallop Lungs: clear to auscultation bilaterally Wound: left leg inproving still weeping lower left leg incision edema much improved  Lab Results: CBC: Basename 01/18/12 0350 01/17/12 0149  WBC 12.0* 16.3*  HGB 10.6* 11.4*  HCT 30.8* 31.8*  PLT 205 257   BMET:  Basename 01/18/12 0350 01/17/12 0950  NA 119* 116*  K 3.3* 3.4*  CL 76* 73*  CO2 35* 33*  GLUCOSE 124* 96  BUN 12 13  CREATININE 0.74 0.75  CALCIUM 8.4 8.6    PT/INR:  Basename 01/18/12 0350  LABPROT 25.1*  INR 2.23*   Radiology: Dg Chest Portable 1 View  01/17/2012  *RADIOLOGY REPORT*  Clinical Data: Chest pain and shortness of breath.  PORTABLE CHEST - 1 VIEW  Comparison: Chest radiograph performed 01/07/2012  Findings: Small to moderate bilateral pleural effusions are noted, similar in appearance to the prior study.  Associated bibasilar airspace opacification is noted.  There is worsening vascular congestion and  increased interstitial markings, raising question for underlying pulmonary edema.  There is no evidence of pneumothorax.  The cardiomediastinal silhouette is enlarged; the patient is status post median sternotomy, with evidence of prior CABG.  An aortic valve replacement is noted.  Calcification is noted within the aortic arch.  No acute osseous abnormalities are seen.  IMPRESSION: Persistent small to moderate bilateral pleural effusions, with associated bibasilar airspace opacification; worsening vascular congestion, cardiomegaly and increased interstitial markings raise question for underlying pulmonary edema.  Original Report Authenticated By: Tonia Ghent, M.D.     Assessment/Plan: S/P   Mobilize Diuresis Cardiac Rehab and ambulation Hyponatremia improving/ has significant  contraction alkalosis, replace KCL    Delight Ovens MD  Beeper  [phone] Office [phone] 01/18/2012 3:26 PM

## 2012-01-18 NOTE — Progress Notes (Signed)
Subjective:  Clinically much better.  Less SOB, stronger.  No diarrhea. No A fib.  Objective:  Vital Signs in the last 24 hours: BP 136/49  Pulse 68  Temp(Src) 97.9 F (36.6 C) (Oral)  Resp 16  Ht 5\' 4"  (1.626 m)  Wt 68.3 kg (150 lb 9.2 oz)  BMI 25.85 kg/m2  SpO2 93%  Physical Exam: Pleasant  WF NAD Lungs:  Decreased BS esp left base Cardiac:  Regular rhythm, normal S1 and S2, 1-2/5 murmur Extremities:  1+edema present   Intake/Output from previous day: 01/24 0701 - 01/25 0700 In: 482 [P.O.:240; I.V.:242] Out: 2450 [Urine:2450] Filed Weights   01/17/12 0645 01/18/12 0430  Weight: 68.6 kg (151 lb 3.8 oz) 68.3 kg (150 lb 9.2 oz)   Lab Results: Basic Metabolic Panel:  Basename 01/18/12 0350 01/17/12 0950  NA 119* 116*  K 3.3* 3.4*  CL 76* 73*  CO2 35* 33*  GLUCOSE 124* 96  BUN 12 13  CREATININE 0.74 0.75    CBC:  Basename 01/18/12 0350 01/17/12 0149  WBC 12.0* 16.3*  NEUTROABS -- --  HGB 10.6* 11.4*  HCT 30.8* 31.8*  MCV 83.9 81.3  PLT 205 257    PROTIME: Lab Results  Component Value Date   INR 2.23* 01/18/2012   INR 3.36* 01/17/2012   INR 5.17* 01/17/2012    Telemetry: Reviewed  Sinus with PVC's  Assessment/Plan:  1. Acute diastolic CHF due to volume overload improving 2. A fib in NSR 3. Hyponatremia improving 4. Recent AVR/CABG  Rec:  Continue diuresis and watch sodium that is improving.  D/c amiodarone for now.  Check CXR in am.  Note her INR goal should be 2-3 with her age and tissue valve.    Darden Palmer.  MD [facility] 01/18/2012, 1:13 PM

## 2012-01-18 NOTE — Progress Notes (Signed)
ANTICOAGULATION CONSULT NOTE - Follow Up Consult  Pharmacy Consult for Coumadin Indication: atrial fibrillation  Allergies  Allergen Reactions  . Ibuprofen     Hx of GI bleed  . Tape Rash    Reaction:Blisters (can use paper tape)    Patient Measurements: Height: 5\' 4"  (162.6 cm) Weight: 150 lb 9.2 oz (68.3 kg) IBW/kg (Calculated) : 54.7   Vital Signs: Temp: 97.9 F (36.6 C) (01/25 1238) Temp src: Oral (01/25 1238) BP: 136/49 mmHg (01/25 1238) Pulse Rate: 68  (01/25 1238)  Labs:  Basename 01/18/12 0350 01/17/12 0950 01/17/12 0149  HGB 10.6* -- 11.4*  HCT 30.8* -- 31.8*  PLT 205 -- 257  APTT -- -- 56*  LABPROT 25.1* 34.5* 48.4*  INR 2.23* 3.36* 5.17*  HEPARINUNFRC -- -- --  CREATININE 0.74 0.75 0.61  CKTOTAL -- -- 66  CKMB -- -- 3.2  TROPONINI -- -- <0.30   Estimated Creatinine Clearance: 51.4 ml/min (by C-G formula based on Cr of 0.74).   Medications:  Scheduled:    . aspirin EC  81 mg Oral Daily  . beta carotene w/minerals  1 tablet Oral BID  . cholecalciferol  400 Units Oral Daily  . folic acid  1 mg Oral Daily  . furosemide  40 mg Intravenous Daily  . levothyroxine  75 mcg Oral Q0700  . metoprolol tartrate  25 mg Oral BID  . mulitivitamin with minerals  1 tablet Oral Daily  . nitroGLYCERIN  2-200 mcg/min Intravenous Once  . potassium chloride SA  20 mEq Oral Daily  . simvastatin  20 mg Oral q1800  . sodium chloride  3 mL Intravenous Q12H  . vitamin C  500 mg Oral Daily  . DISCONTD: furosemide  40 mg Intravenous Q12H    Assessment: 78 y/o female patient on chronic coumadin for h/o afib, INR now therapeutic after dose held. Admit with supratherapeutic INR, ok to resume. Has h/o AVR but this is tissue valve so standard goal INR.  Goal of Therapy:  INR 2-3   Plan:  Coumadin 5mg  today and f/u in am.  Verlene Mayer, PharmD, BCPS Pager [phone] 01/18/2012,2:31 PM

## 2012-01-18 NOTE — Progress Notes (Signed)
[facility]  Subjective: Endorses from respiratory standpoint she feels much better today. Shortness of breath has resolved. She's had no chest pain. No tachypalpitations. Currently she endorses she is fatigued from having been up in the chair for greater than 2 hours.  Objective: Blood pressure 136/49, pulse 68, temperature 97.9 F (36.6 C), temperature source Oral, resp. rate 16, height 5\' 4"  (1.626 m), weight 68.3 kg (150 lb 9.2 oz), SpO2 93.00%.  Intake/Output from previous day: 01/24 0701 - 01/25 0700 In: 482 [P.O.:240; I.V.:242] Out: 2450 [Urine:2450] Intake/Output this shift: Total I/O In: 120 [P.O.:120] Out: -   General appearance: alert, cooperative, appears stated age and no distress Resp: clear to auscultation bilaterally Cardio: regular rate and rhythm, S1, S2 normal, no murmur, click, rub or gallop GI: soft, non-tender; bowel sounds normal; no masses,  no organomegaly Extremities: extremities normal, atraumatic, no cyanosis or edema Neurologic: Grossly normal  Lab Results:  Basename 01/18/12 0350 01/17/12 0149  WBC 12.0* 16.3*  HGB 10.6* 11.4*  HCT 30.8* 31.8*  PLT 205 257   BMET  Basename 01/18/12 0350 01/17/12 0950  NA 119* 116*  K 3.3* 3.4*  CL 76* 73*  CO2 35* 33*  GLUCOSE 124* 96  BUN 12 13  CREATININE 0.74 0.75  CALCIUM 8.4 8.6    Studies/Results: Dg Chest Portable 1 View  01/17/2012  *RADIOLOGY REPORT*  Clinical Data: Chest pain and shortness of breath.  PORTABLE CHEST - 1 VIEW  Comparison: Chest radiograph performed 01/07/2012   IMPRESSION: Persistent small to moderate bilateral pleural effusions, with associated bibasilar airspace opacification; worsening vascular congestion, cardiomegaly and increased interstitial markings raise question for underlying pulmonary edema.  Original Report Authenticated By: Tonia Ghent, M.D.    Medications:  I have reviewed the patient's current medications.  Assessment/Plan:   *CHF (congestive heart  failure) Appreciate Dr. Donnie Aho and Dr. Dennie Maizes assistance. She has successfully diuresed over 3000 cc since admission. We'll continue current Lasix dosage for now. 2-D echocardiogram shows normal systolic function with an EF of 55-60% with moderate concentric hypertrophy. This is an improvement in the postop period given the fact that her intraoperative transesophageal echocardiogram demonstrated a moderately reduced systolic function with an EF of 40-45%. Blood pressure is marginal but improving.   Hypothyroidism TSH is slightly elevated this admission at 4.652 but for now we'll continue current dosage of Synthroid. Recommend she have repeat TSH completed with free T4 after discharge at her primary care physician's office.   CAD (coronary artery disease)/ S/P AVR and CABG No chest pain this admission and cardiac isoenzymes have been negative. Is on beta blocker but the dose was reduced by 50% due to marginal blood pressures.  2-D echocardiogram completed this admission shows the valve is functioning well.   Hyponatremia Sodium is still low at 119 - the patient is asymptomatic. Chemistries consistent with dilutional etiology. We'll continue to diuresis as blood pressure tolerates. Follow electrolyte panel accordingly. Cardiology has stopped the amiodarone since this can contribute to hyponatremia.   Warfarin-induced coagulopathy Pharmacy managing the Coumadin. INR is down to 2.23. Expected goal for person with mechanical heart valve would be 2.5-3.5.   Atrial fibrillation Amiodarone discontinued as discussed above. Currently is on beta blocker for rate control.   Disposition Transfer to telemetry unit.   LOS: 1 day   Junious Silk, ANP pager [phone]  [facility] [email or web address] Password: [email or web address]  01/18/2012, 12:55 PM  I have personally examined this patient and reviewed the entire database. I have reviewed the  above note, made any necessary editorial changes, and agree  with its content.  Lonia Blood, MD [facility]

## 2012-01-18 NOTE — Clinical Documentation Improvement (Signed)
CHF DOCUMENTATION CLARIFICATION QUERY  THIS DOCUMENT IS NOT A PERMANENT PART OF THE MEDICAL RECORD  TO RESPOND TO THE THIS QUERY, FOLLOW THE INSTRUCTIONS BELOW:  1. If needed, update documentation for the patient's encounter via the notes activity.  2. Access this query again and click edit on the In [employer].  3. After updating, or not, click F2 to complete all highlighted (required) fields concerning your review. Select "additional documentation in the medical record" OR "no additional documentation provided".  4. Click Sign note button.  5. The deficiency will fall out of your In Basket *Please let us know if you are not able to complete this workflow by phone or e-mail (listed below).  Please update your documentation within the medical record to reflect your response to this query.                                                                                    01/18/12  Dear Dr. Georga Hacking / Associates,   In a better effort to capture your patient's severity of illness, reflect appropriate length of stay and utilization of resources, a review of the patient medical record has revealed the following indicators the diagnosis of Heart Failure.    Based on your clinical judgment, please clarify and document in a progress note and/or discharge summary the clinical condition associated with the following supporting information:  In responding to this query please exercise your independent judgment.  The fact that a query is asked, does not imply that any particular answer is desired or expected.  Per H + P patient admitted with "acute congestive heart failure" with a Pro BNP up to 7000 from 400 during a previous admission, sob w bilateral pedal edema.  Please clarify type of CHF if known?  Thank you      Possible Clinical Conditions? Chronic Systolic Congestive Heart Failure  Chronic Diastolic Congestive Heart Failure  Chronic Systolic & Diastolic Congestive Heart  Failure  Acute Systolic Congestive Heart Failure  Acute Diastolic Congestive Heart Failure  Acute Systolic & Diastolic Congestive Heart Failure  Acute on Chronic Systolic Congestive Heart Failure  Acute on Chronic Diastolic Congestive Heart Failure  Acute on Chronic Systolic & Diastolic  Congestive Heart Failure  Other Condition________________________________________  Cannot Clinically Determine  Supporting Information:  Risk Factors: cad s/p 3 vessel  cabg, hypertensive heart dz, recent aortic valve replacement   Signs & Symptoms: bil pedal edema, sob, weakness   Diagnostics: chest x-ray " with moderate vascular congestion  Treatment: IV fluids discontinued, Lasix IV   Reviewed: additional documentation in the medical record  Thank You,  Lavonda Jumbo  Clinical Documentation Specialist RN, BSN, CDS:  Pager [phone]  [facility] Information Management [place]

## 2012-01-18 NOTE — Progress Notes (Signed)
CRITICAL VALUE ALERT  Critical value received:  Na 119  Date of notification:  01/18/12  Time of notification:  0450  Critical value read back:yes  Nurse who received alert:  Windell Moment, RN  MD notified (1st page):  Donnamarie Poag, NP  Time of first page:  [phone]  MD notified (2nd page):  Time of second page:  Responding MD:  Donnamarie Poag, NP  Time MD responded:  [phone]  No new orders received.

## 2012-01-19 ENCOUNTER — Inpatient Hospital Stay (HOSPITAL_COMMUNITY): Payer: Medicare Other

## 2012-01-19 DIAGNOSIS — I509 Heart failure, unspecified: Secondary | ICD-10-CM | POA: Diagnosis not present

## 2012-01-19 DIAGNOSIS — I359 Nonrheumatic aortic valve disorder, unspecified: Secondary | ICD-10-CM | POA: Diagnosis not present

## 2012-01-19 DIAGNOSIS — I5031 Acute diastolic (congestive) heart failure: Secondary | ICD-10-CM | POA: Diagnosis not present

## 2012-01-19 DIAGNOSIS — E871 Hypo-osmolality and hyponatremia: Secondary | ICD-10-CM | POA: Diagnosis not present

## 2012-01-19 DIAGNOSIS — E039 Hypothyroidism, unspecified: Secondary | ICD-10-CM | POA: Diagnosis not present

## 2012-01-19 DIAGNOSIS — I4891 Unspecified atrial fibrillation: Secondary | ICD-10-CM

## 2012-01-19 DIAGNOSIS — D649 Anemia, unspecified: Secondary | ICD-10-CM | POA: Diagnosis not present

## 2012-01-19 DIAGNOSIS — J984 Other disorders of lung: Secondary | ICD-10-CM | POA: Diagnosis not present

## 2012-01-19 DIAGNOSIS — J90 Pleural effusion, not elsewhere classified: Secondary | ICD-10-CM | POA: Diagnosis not present

## 2012-01-19 LAB — PROTIME-INR
INR: 1.66 — ABNORMAL HIGH (ref 0.00–1.49)
Prothrombin Time: 19.9 seconds — ABNORMAL HIGH (ref 11.6–15.2)

## 2012-01-19 LAB — BASIC METABOLIC PANEL
BUN: 9 mg/dL (ref 6–23)
CO2: 37 mEq/L — ABNORMAL HIGH (ref 19–32)
Calcium: 8.7 mg/dL (ref 8.4–10.5)
Chloride: 80 mEq/L — ABNORMAL LOW (ref 96–112)
Creatinine, Ser: 0.65 mg/dL (ref 0.50–1.10)
GFR calc Af Amer: >90 mL/min (ref >90)
GFR calc non Af Amer: 80 mL/min — ABNORMAL LOW (ref >90)
Glucose, Bld: 91 mg/dL (ref 70–99)
Potassium: 4.1 mEq/L (ref 3.5–5.1)
Sodium: 123 mEq/L — ABNORMAL LOW (ref 135–145)

## 2012-01-19 LAB — CBC
HCT: 34.9 % — ABNORMAL LOW (ref 36.0–46.0)
Hemoglobin: 11.8 g/dL — ABNORMAL LOW (ref 12.0–15.0)
MCH: 29.3 pg (ref 26.0–34.0)
MCHC: 33.8 g/dL (ref 30.0–36.0)
MCV: 86.6 fL (ref 78.0–100.0)
Platelets: 224 10*3/uL (ref 150–400)
RBC: 4.03 MIL/uL (ref 3.87–5.11)
RDW: 18.2 % — ABNORMAL HIGH (ref 11.5–15.5)
WBC: 9.7 10*3/uL (ref 4.0–10.5)

## 2012-01-19 LAB — HEPARIN LEVEL (UNFRACTIONATED): Heparin Unfractionated: 0.25 IU/mL — ABNORMAL LOW (ref 0.30–0.70)

## 2012-01-19 MED ORDER — WARFARIN SODIUM 7.5 MG PO TABS
7.5000 mg | ORAL_TABLET | Freq: Once | ORAL | Status: AC
  Administered 2012-01-19: 7.5 mg via ORAL
  Filled 2012-01-19: qty 1

## 2012-01-19 MED ORDER — HEPARIN SOD (PORCINE) IN D5W 100 UNIT/ML IV SOLN
1150.0000 [IU]/h | INTRAVENOUS | Status: DC
  Administered 2012-01-19: 1000 [IU]/h via INTRAVENOUS
  Administered 2012-01-20: 1150 [IU]/h via INTRAVENOUS
  Filled 2012-01-19 (×3): qty 250

## 2012-01-19 NOTE — Progress Notes (Signed)
CARDIAC REHAB PHASE I   PRE:  Rate/Rhythm: 73 SR with PVCs    BP: sitting 106/57    SaO2: 97  RA  MODE:  Ambulation: 200 ft   POST:  Rate/Rhythm: 68 SR    BP: sitting 142/60     SaO2: 93 RA  Tolerated well with RW. Began as assist x2 then able to do Assist x1. No c/o SOB. To recliner after walk.  [phone]   Harriet Masson CES, ACSM

## 2012-01-19 NOTE — Progress Notes (Signed)
ANTICOAGULATION CONSULT NOTE - Follow Up Consult  Pharmacy Consult for Coumadin, Heparin Indication: atrial fibrillation  Allergies  Allergen Reactions  . Ibuprofen     Hx of GI bleed  . Tape Rash    Reaction:Blisters (can use paper tape)    Patient Measurements: Height: 5\' 4"  (162.6 cm) Weight: 153 lb 7 oz (69.6 kg) (bed weight) IBW/kg (Calculated) : 54.7  Heparin dosing weight: 70Kg  Vital Signs: Temp: 98.3 F (36.8 C) (01/26 0548) Temp src: Oral (01/26 0548) BP: 148/74 mmHg (01/26 0548) Pulse Rate: 74  (01/26 0548)  Labs:  Basename 01/19/12 0720 01/18/12 0350 01/17/12 0950 01/17/12 0149  HGB 11.8* 10.6* -- --  HCT 34.9* 30.8* -- 31.8*  PLT 224 205 -- 257  APTT -- -- -- 56*  LABPROT 19.9* 25.1* 34.5* --  INR 1.66* 2.23* 3.36* --  HEPARINUNFRC -- -- -- --  CREATININE 0.65 0.74 0.75 --  CKTOTAL -- -- -- 66  CKMB -- -- -- 3.2  TROPONINI -- -- -- <0.30   Estimated Creatinine Clearance: 52 ml/min (by C-G formula based on Cr of 0.65).   Medications:  Scheduled:     . aspirin EC  81 mg Oral Daily  . beta carotene w/minerals  1 tablet Oral BID  . cholecalciferol  400 Units Oral Daily  . docusate sodium  100 mg Oral BID  . folic acid  1 mg Oral Daily  . furosemide  40 mg Intravenous Daily  . levothyroxine  75 mcg Oral Q0700  . metoprolol tartrate  25 mg Oral BID  . mulitivitamin with minerals  1 tablet Oral Daily  . nitroGLYCERIN  2-200 mcg/min Intravenous Once  . potassium chloride SA  20 mEq Oral Daily  . simvastatin  20 mg Oral q1800  . vitamin C  500 mg Oral Daily  . warfarin  5 mg Oral ONCE-1800  . DISCONTD: furosemide  40 mg Intravenous Q12H  . DISCONTD: sodium chloride  3 mL Intravenous Q12H    Assessment: 78 y/o female patient on chronic coumadin for h/o afib, INR was supratherapeutic on admit, now subtherapeutic after doses held x3 days- noted Coumadin 5mg  ordered 1/25, but not charted. H/H and plts stable. Has h/o AVR but this is tissue valve so  standard goal INR.  Goal of Therapy:  INR 2-3 Heparin level 0.3-0.7 iu/ml   Plan:  1. Start heparin at 1000 units/hr, no bolus d/t INR >1.5.  2. Coumadin 7.5mg  PO today 3. Will check heparin level at 1800 today, then daily CBC, PT/INR and heparin level. 4. Will plan to continue heparin until INR >2 x 48h, unless directed otherwise by MD  Payal Stanforth K. Allena Katz, PharmD, BCPS.  Clinical Pharmacist Pager [phone]. 01/19/2012 9:29 AM

## 2012-01-19 NOTE — Progress Notes (Signed)
  Subjective: No complaints  Objective: Vital signs in last 24 hours: Temp:  [97.3 F (36.3 C)-98.3 F (36.8 C)] 97.8 F (36.6 C) (01/26 1406) Pulse Rate:  [60-74] 63  (01/26 1406) Cardiac Rhythm:  [-] Heart block (01/26 1100) Resp:  [16-18] 18  (01/26 1406) BP: (106-148)/(50-74) 106/57 mmHg (01/26 1406) SpO2:  [92 %-96 %] 96 % (01/26 1406) Weight:  [69.6 kg (153 lb 7 oz)] 69.6 kg (153 lb 7 oz) (01/26 0548)  Hemodynamic parameters for last 24 hours:    Intake/Output from previous day: 01/25 0701 - 01/26 0700 In: 360 [P.O.:360] Out: 1450 [Urine:1450] Intake/Output this shift: Total I/O In: 1080 [P.O.:1080] Out: 1900 [Urine:1900]  General appearance: alert and cooperative Heart: regular rate and rhythm, S1, S2 normal, no murmur, click, rub or gallop Lungs: clear to auscultation bilaterally Extremities: edema almost completely gone in legs Wound: leg incision intact, still has a little serous drainage in lower part. staples pulling through edges of skin so they were all removed.  Lab Results:  Basename 01/19/12 0720 01/18/12 0350  WBC 9.7 12.0*  HGB 11.8* 10.6*  HCT 34.9* 30.8*  PLT 224 205   BMET:  Basename 01/19/12 0720 01/18/12 0350  NA 123* 119*  K 4.1 3.3*  CL 80* 76*  CO2 37* 35*  GLUCOSE 91 124*  BUN 9 12  CREATININE 0.65 0.74  CALCIUM 8.7 8.4    PT/INR:  Basename 01/19/12 0720  LABPROT 19.9*  INR 1.66*   ABG    Component Value Date/Time   PHART 7.329* 12/28/2011 2205   HCO3 24.8* 12/28/2011 2205   TCO2 24 12/29/2011 1705   ACIDBASEDEF 1.0 12/28/2011 2205   O2SAT 94.0 12/28/2011 2205   CBG (last 3)  No results found for this basename: GLUCAP:3 in the last 72 hours  Assessment/Plan:  Volume excess resolving nicely. Staples out Continue to observe leg incision Home when ok with cardiology  LOS: 2 days    Evelene Croon K 01/19/2012

## 2012-01-19 NOTE — Progress Notes (Signed)
Patient ID: Carol [name], female   DOB: 09/22/1928, 78 y.o.   MRN: [id number] Subjective:  Clinically much better.  Less SOB, stronger.  No diarrhea. No A fib.  Objective:  Vital Signs in the last 24 hours: BP 148/74  Pulse 74  Temp(Src) 98.3 F (36.8 C) (Oral)  Resp 18  Ht 5\' 4"  (1.626 m)  Wt 69.6 kg (153 lb 7 oz)  BMI 26.34 kg/m2  SpO2 92%  Physical Exam: Pleasant  WF NAD Lungs:  Decreased BS esp left base Cardiac:  Regular rhythm, normal S1 and S2, 1-2/5 murmur Extremities:  1+edema present venous stasis Neuro nonfocal BS positive nontender   Intake/Output from previous day: 01/25 0701 - 01/26 0700 In: 360 [P.O.:360] Out: 1450 [Urine:1450] Filed Weights   01/17/12 0645 01/18/12 0430 01/19/12 0548  Weight: 68.6 kg (151 lb 3.8 oz) 68.3 kg (150 lb 9.2 oz) 69.6 kg (153 lb 7 oz)   Lab Results: Basic Metabolic Panel:  Basename 01/19/12 0720 01/18/12 0350  NA 123* 119*  K 4.1 3.3*  CL 80* 76*  CO2 37* 35*  GLUCOSE 91 124*  BUN 9 12  CREATININE 0.65 0.74    CBC:  Basename 01/19/12 0720 01/18/12 0350  WBC 9.7 12.0*  NEUTROABS -- --  HGB 11.8* 10.6*  HCT 34.9* 30.8*  MCV 86.6 83.9  PLT 224 205    PROTIME: Lab Results  Component Value Date   INR 1.66* 01/19/2012   INR 2.23* 01/18/2012   INR 3.36* 01/17/2012    Telemetry: Reviewed  Sinus with PVC's  Assessment/Plan:  1. Acute diastolic CHF due to volume overload improving Foley D/C after am Lasix dose CXR pending 2. A fib in NSR 3. Hyponatremia improving 4. Recent AVR/CABG normal exam and getting coumadin.  Heparin started D/C staples in LLE     Charlton Haws  01/19/2012, 10:35 AM

## 2012-01-19 NOTE — Progress Notes (Signed)
Subjective: Chart reviewed. Patient says she's feeling much better. She would like the Foley catheter to be removed. She denies dyspnea. No chest pain.  Objective: Blood pressure 148/74, pulse 74, temperature 98.3 F (36.8 C), temperature source Oral, resp. rate 18, height 5\' 4"  (1.626 m), weight 69.6 kg (153 lb 7 oz), SpO2 92.00%.  Intake/Output Summary (Last 24 hours) at 01/19/12 0957 Last data filed at 01/19/12 4540  Gross per 24 hour  Intake    600 ml  Output   1450 ml  Net   -850 ml    General Exam: Comfortable. Sitting in chair and is in no obvious distress. Respiratory System: Occasional basal crackles but otherwise clear. No increased work of breathing. Cardiovascular System: First and second heart sounds heard. Regular rate and rhythm. No JVD/murmurs. Telemetry shows sinus rhythm with first degree AV block. Gastrointestinal System: Abdomen is non distended, soft and normal bowel sounds heard. Central Nervous System: Alert and oriented. No focal neurological deficits. Extremities: Left leg incision staples present and wound appears clean dry and intact.  Lab Results: Basic Metabolic Panel:  Basename 01/19/12 0720 01/18/12 0350  NA 123* 119*  K 4.1 3.3*  CL 80* 76*  CO2 37* 35*  GLUCOSE 91 124*  BUN 9 12  CREATININE 0.65 0.74  CALCIUM 8.7 8.4  MG -- --  PHOS -- --   Liver Function Tests:  Basename 01/17/12 0149  AST 22  ALT 17  ALKPHOS 110  BILITOT 1.2  PROT 5.9*  ALBUMIN 3.5   No results found for this basename: LIPASE:2,AMYLASE:2 in the last 72 hours No results found for this basename: AMMONIA:2 in the last 72 hours CBC:  Basename 01/19/12 0720 01/18/12 0350  WBC 9.7 12.0*  NEUTROABS -- --  HGB 11.8* 10.6*  HCT 34.9* 30.8*  MCV 86.6 83.9  PLT 224 205   Cardiac Enzymes:  Basename 01/17/12 0149  CKTOTAL 66  CKMB 3.2  CKMBINDEX --  TROPONINI <0.30   BNP:  Basename 01/17/12 0149  PROBNP 7080.0*   D-Dimer: No results found for this  basename: DDIMER:2 in the last 72 hours CBG: No results found for this basename: GLUCAP:6 in the last 72 hours Hemoglobin A1C: No results found for this basename: HGBA1C in the last 72 hours Fasting Lipid Panel: No results found for this basename: CHOL,HDL,LDLCALC,TRIG,CHOLHDL,LDLDIRECT in the last 72 hours Thyroid Function Tests:  Basename 01/17/12 0815  TSH 4.652*  T4TOTAL --  FREET4 --  T3FREE --  THYROIDAB --   Anemia Panel: No results found for this basename: VITAMINB12,FOLATE,FERRITIN,TIBC,IRON,RETICCTPCT in the last 72 hours Coagulation:  Basename 01/19/12 0720 01/18/12 0350  LABPROT 19.9* 25.1*  INR 1.66* 2.23*   Urine Drug Screen: Drugs of Abuse  No results found for this basename: labopia,  cocainscrnur,  labbenz,  amphetmu,  thcu,  labbarb    Alcohol Level: No results found for this basename: ETH:2 in the last 72 hours  Micro Results: Recent Results (from the past 240 hour(s))  MRSA PCR SCREENING     Status: Normal   Collection Time   01/17/12  6:54 AM      Component Value Range Status Comment   MRSA by PCR NEGATIVE  NEGATIVE  Final     Studies/Results:  Dg Chest Portable 1 View  01/17/2012  *RADIOLOGY REPORT*  Clinical Data: Chest pain and shortness of breath.  PORTABLE CHEST - 1 VIEW  Comparison: Chest radiograph performed 01/07/2012  Findings: Small to moderate bilateral pleural effusions are noted, similar in  appearance to the prior study.  Associated bibasilar airspace opacification is noted.  There is worsening vascular congestion and increased interstitial markings, raising question for underlying pulmonary edema.  There is no evidence of pneumothorax.  The cardiomediastinal silhouette is enlarged; the patient is status post median sternotomy, with evidence of prior CABG.  An aortic valve replacement is noted.  Calcification is noted within the aortic arch.  No acute osseous abnormalities are seen.  IMPRESSION: Persistent small to moderate bilateral pleural  effusions, with associated bibasilar airspace opacification; worsening vascular congestion, cardiomegaly and increased interstitial markings raise question for underlying pulmonary edema.  Original Report Authenticated By: Tonia Ghent, M.D.     Medications: Scheduled Meds:    . aspirin EC  81 mg Oral Daily  . beta carotene w/minerals  1 tablet Oral BID  . cholecalciferol  400 Units Oral Daily  . docusate sodium  100 mg Oral BID  . folic acid  1 mg Oral Daily  . furosemide  40 mg Intravenous Daily  . levothyroxine  75 mcg Oral Q0700  . metoprolol tartrate  25 mg Oral BID  . mulitivitamin with minerals  1 tablet Oral Daily  . nitroGLYCERIN  2-200 mcg/min Intravenous Once  . potassium chloride SA  20 mEq Oral Daily  . simvastatin  20 mg Oral q1800  . vitamin C  500 mg Oral Daily  . warfarin  5 mg Oral ONCE-1800  . warfarin  7.5 mg Oral ONCE-1800  . DISCONTD: furosemide  40 mg Intravenous Q12H  . DISCONTD: sodium chloride  3 mL Intravenous Q12H   Continuous Infusions:    . sodium chloride    . heparin     PRN Meds:.diphenhydrAMINE, magnesium hydroxide, DISCONTD: sodium chloride, DISCONTD: sodium chloride  Assessment/Plan: 1. Acute diastolic congestive heart failure: Secondary to volume overload. Patient diuresing well and is clinically improving. Cardiology is following. 2. Severe Hyponatremia: Sodium was 110 on admission. Probably dilutional. Improving. Follow BMP tomorrow. 3. Hypothyroidism: Mildly elevated TSH. Will repeat full thyroid function tests and may have to marginally increased his Synthroid dose if TSH is still high. Clinically she appears euthyroid. 4. Atrial fibrillation: Currently in sinus rhythm. Subtherapeutic INR. Agree with intravenous heparin bridging until INR is therapeutic especially given her recent aortic valve replacement. 5. Anemia: Stable 6. Status post tissue AVR: Goal INR per cardiology 2-3. Defer to the surgeons regarding timing of removal of  staples in her left leg. IV heparin bridging until INR is therapeutic at goal. 7. Coronary artery disease status post recent CABG: Stable   Disposition: Possible discharge in the next 48 hours.    Carol [name] 01/19/2012, 9:57 AM

## 2012-01-19 NOTE — Progress Notes (Signed)
Pharmacy - Heparin  PM heparin level = 0.25 (goal = 0.3 to 0.7) Awaiting therapeutic INR for Afib No bleeding noted  Plan: 1) Increase heparin to 1150 units / hr 2) Follow up AM heparin level  Thank you.  Okey Regal, PharmD

## 2012-01-20 DIAGNOSIS — I509 Heart failure, unspecified: Secondary | ICD-10-CM | POA: Diagnosis not present

## 2012-01-20 DIAGNOSIS — D649 Anemia, unspecified: Secondary | ICD-10-CM | POA: Diagnosis not present

## 2012-01-20 DIAGNOSIS — I5031 Acute diastolic (congestive) heart failure: Secondary | ICD-10-CM | POA: Diagnosis not present

## 2012-01-20 DIAGNOSIS — E039 Hypothyroidism, unspecified: Secondary | ICD-10-CM | POA: Diagnosis not present

## 2012-01-20 DIAGNOSIS — E871 Hypo-osmolality and hyponatremia: Secondary | ICD-10-CM | POA: Diagnosis not present

## 2012-01-20 DIAGNOSIS — I4891 Unspecified atrial fibrillation: Secondary | ICD-10-CM | POA: Diagnosis not present

## 2012-01-20 LAB — CBC
HCT: 32.9 % — ABNORMAL LOW (ref 36.0–46.0)
Hemoglobin: 11.1 g/dL — ABNORMAL LOW (ref 12.0–15.0)
MCH: 28.9 pg (ref 26.0–34.0)
MCHC: 33.7 g/dL (ref 30.0–36.0)
MCV: 85.7 fL (ref 78.0–100.0)
Platelets: 177 10*3/uL (ref 150–400)
RBC: 3.84 MIL/uL — ABNORMAL LOW (ref 3.87–5.11)
RDW: 18.1 % — ABNORMAL HIGH (ref 11.5–15.5)
WBC: 9.1 10*3/uL (ref 4.0–10.5)

## 2012-01-20 LAB — TSH: TSH: 7.747 u[IU]/mL — ABNORMAL HIGH (ref 0.350–4.500)

## 2012-01-20 LAB — PROTIME-INR
INR: 1.69 — ABNORMAL HIGH (ref 0.00–1.49)
Prothrombin Time: 20.2 seconds — ABNORMAL HIGH (ref 11.6–15.2)

## 2012-01-20 LAB — BASIC METABOLIC PANEL
BUN: 11 mg/dL (ref 6–23)
CO2: 36 mEq/L — ABNORMAL HIGH (ref 19–32)
Calcium: 8.5 mg/dL (ref 8.4–10.5)
Chloride: 83 mEq/L — ABNORMAL LOW (ref 96–112)
Creatinine, Ser: 0.74 mg/dL (ref 0.50–1.10)
GFR calc Af Amer: 89 mL/min — ABNORMAL LOW (ref >90)
GFR calc non Af Amer: 77 mL/min — ABNORMAL LOW (ref >90)
Glucose, Bld: 89 mg/dL (ref 70–99)
Potassium: 4.2 mEq/L (ref 3.5–5.1)
Sodium: 124 mEq/L — ABNORMAL LOW (ref 135–145)

## 2012-01-20 LAB — HEPARIN LEVEL (UNFRACTIONATED)
Heparin Unfractionated: 0.4 IU/mL (ref 0.30–0.70)
Heparin Unfractionated: 0.59 IU/mL (ref 0.30–0.70)

## 2012-01-20 LAB — T3, FREE: T3, Free: 1.9 pg/mL — ABNORMAL LOW (ref 2.3–4.2)

## 2012-01-20 LAB — T4, FREE: Free T4: 1.61 ng/dL (ref 0.80–1.80)

## 2012-01-20 MED ORDER — WARFARIN SODIUM 7.5 MG PO TABS
7.5000 mg | ORAL_TABLET | Freq: Once | ORAL | Status: AC
  Administered 2012-01-20: 7.5 mg via ORAL
  Filled 2012-01-20: qty 1

## 2012-01-20 MED ORDER — LEVOTHYROXINE SODIUM 88 MCG PO TABS
88.0000 ug | ORAL_TABLET | ORAL | Status: DC
Start: 2012-01-21 — End: 2012-01-21
  Administered 2012-01-21: 88 ug via ORAL
  Filled 2012-01-20 (×2): qty 1

## 2012-01-20 NOTE — Progress Notes (Signed)
ANTICOAGULATION CONSULT NOTE - Follow Up Consult  Pharmacy Consult for Coumadin, Heparin Indication: atrial fibrillation  Allergies  Allergen Reactions  . Ibuprofen     Hx of GI bleed  . Tape Rash    Reaction:Blisters (can use paper tape)    Patient Measurements: Height: 5\' 4"  (162.6 cm) Weight: 150 lb 2.1 oz (68.1 kg) IBW/kg (Calculated) : 54.7  Heparin dosing weight: 70Kg  Vital Signs: Temp: 96.9 F (36.1 C) (01/27 0440) Temp src: Oral (01/27 0440) BP: 91/59 mmHg (01/27 0440) Pulse Rate: 70  (01/27 0440)  Labs:  Basename 01/20/12 0500 01/19/12 1741 01/19/12 0720 01/18/12 0350  HGB 11.1* -- 11.8* --  HCT 32.9* -- 34.9* 30.8*  PLT 177 -- 224 205  APTT -- -- -- --  LABPROT 20.2* -- 19.9* 25.1*  INR 1.69* -- 1.66* 2.23*  HEPARINUNFRC 0.40 0.25* -- --  CREATININE 0.74 -- 0.65 0.74  CKTOTAL -- -- -- --  CKMB -- -- -- --  TROPONINI -- -- -- --   Estimated Creatinine Clearance: 51.4 ml/min (by C-G formula based on Cr of 0.74).   Medications:  Scheduled:     . aspirin EC  81 mg Oral Daily  . beta carotene w/minerals  1 tablet Oral BID  . cholecalciferol  400 Units Oral Daily  . docusate sodium  100 mg Oral BID  . folic acid  1 mg Oral Daily  . furosemide  40 mg Intravenous Daily  . levothyroxine  75 mcg Oral Q0700  . metoprolol tartrate  25 mg Oral BID  . mulitivitamin with minerals  1 tablet Oral Daily  . nitroGLYCERIN  2-200 mcg/min Intravenous Once  . potassium chloride SA  20 mEq Oral Daily  . simvastatin  20 mg Oral q1800  . vitamin C  500 mg Oral Daily  . warfarin  5 mg Oral ONCE-1800  . warfarin  7.5 mg Oral ONCE-1800    Assessment: 78 y/o female patient on chronic coumadin for h/o afib, INR was supratherapeutic on admit, now subtherapeutic. H/H stable, noted plts decreased from baseline. No overt bleeding reported. Has h/o AVR but this is tissue valve so standard goal INR.  Goal of Therapy:  INR 2-3 Heparin level 0.3-0.7 iu/ml   Plan:  1.  Continue heparin at 1150 units/hr, recheck heparin level at 1300 today.  2. Coumadin 7.5mg  PO again today 3. Cont daily CBC, PT/INR and heparin level. 4. Will plan to continue heparin until INR >2 x 48h, unless directed otherwise by MD  Beretta Ginsberg K. Allena Katz, PharmD, BCPS.  Clinical Pharmacist Pager [phone]. 01/20/2012 8:28 AM

## 2012-01-20 NOTE — Progress Notes (Signed)
  Subjective: No complaints  Objective: Vital signs in last 24 hours: Temp:  [96.9 F (36.1 C)-98.1 F (36.7 C)] 96.9 F (36.1 C) (01/27 0440) Pulse Rate:  [63-75] 70  (01/27 0440) Cardiac Rhythm:  [-] Heart block (01/27 0850) Resp:  [18] 18  (01/27 0440) BP: (91-115)/(57-63) 91/59 mmHg (01/27 0440) SpO2:  [96 %-98 %] 98 % (01/27 0440) Weight:  [68.1 kg (150 lb 2.1 oz)] 68.1 kg (150 lb 2.1 oz) (01/27 0440)  Hemodynamic parameters for last 24 hours:    Intake/Output from previous day: 01/26 0701 - 01/27 0700 In: 1483.8 [P.O.:1080; I.V.:403.8] Out: 3050 [Urine:3050] Intake/Output this shift: Total I/O In: 240 [P.O.:240] Out: -  Awake and alert Lungs:  Decreased in bases L>R Heart: RRR Extremities: edema moderate bilat lower leg edema.  This looks like more than yesterday when I saw her but she has had legs down for a few hrs in chair. The leg incision has some serous drainage in lower portion. Incision is intact without signs of infection.  Lab Results:  Basename 01/20/12 0500 01/19/12 0720  WBC 9.1 9.7  HGB 11.1* 11.8*  HCT 32.9* 34.9*  PLT 177 224   BMET:  Basename 01/20/12 0500 01/19/12 0720  NA 124* 123*  K 4.2 4.1  CL 83* 80*  CO2 36* 37*  GLUCOSE 89 91  BUN 11 9  CREATININE 0.74 0.65  CALCIUM 8.5 8.7    PT/INR:  Basename 01/20/12 0500  LABPROT 20.2*  INR 1.69*   ABG    Component Value Date/Time   PHART 7.329* 12/28/2011 2205   HCO3 24.8* 12/28/2011 2205   TCO2 24 12/29/2011 1705   ACIDBASEDEF 1.0 12/28/2011 2205   O2SAT 94.0 12/28/2011 2205   CBG (last 3)  No results found for this basename: GLUCAP:3 in the last 72 hours  Assessment/Plan: S/P   Mobilize Diuresis Important to keep edema out of legs to allow incision to heal.  Otherwise it will break down and get infected.   LOS: 3 days    BARTLE,BRYAN K 01/20/2012

## 2012-01-20 NOTE — Progress Notes (Signed)
Pharmacy: Re-Heparin  A:  Patient is an 78 y.o F on heparin for afib/tissue AVR.  Repeat heparin level now back still therapeutic at 0.59.  P: Continue with current heparin regimen.  Dorna Leitz, PharmD, BCPS

## 2012-01-20 NOTE — Progress Notes (Signed)
Subjective: Foley catheter is out. Staples are out. Patient ate while at breakfast. Denies dyspnea or chest pain. Says she feels well.  Objective: Blood pressure 91/59, pulse 70, temperature 96.9 F (36.1 C), temperature source Oral, resp. rate 18, height 5\' 4"  (1.626 m), weight 68.1 kg (150 lb 2.1 oz), SpO2 98.00%.  Intake/Output Summary (Last 24 hours) at 01/20/12 1243 Last data filed at 01/20/12 0911  Gross per 24 hour  Intake 1243.84 ml  Output   1800 ml  Net -556.16 ml    General Exam: Comfortable. Sitting in chair and is in no obvious distress. Respiratory System: clear. No increased work of breathing. Cardiovascular System: First and second heart sounds heard. Regular rate and rhythm. No JVD/murmurs.  Gastrointestinal System: Abdomen is non distended, soft and normal bowel sounds heard. Central Nervous System: Alert and oriented. No focal neurological deficits. Extremities: Left wound appears clean dry and intact.  Lab Results: Basic Metabolic Panel:  Basename 01/20/12 0500 01/19/12 0720  NA 124* 123*  K 4.2 4.1  CL 83* 80*  CO2 36* 37*  GLUCOSE 89 91  BUN 11 9  CREATININE 0.74 0.65  CALCIUM 8.5 8.7  MG -- --  PHOS -- --   Liver Function Tests: No results found for this basename: AST:2,ALT:2,ALKPHOS:2,BILITOT:2,PROT:2,ALBUMIN:2 in the last 72 hours No results found for this basename: LIPASE:2,AMYLASE:2 in the last 72 hours No results found for this basename: AMMONIA:2 in the last 72 hours CBC:  Basename 01/20/12 0500 01/19/12 0720  WBC 9.1 9.7  NEUTROABS -- --  HGB 11.1* 11.8*  HCT 32.9* 34.9*  MCV 85.7 86.6  PLT 177 224   Cardiac Enzymes: No results found for this basename: CKTOTAL:3,CKMB:3,CKMBINDEX:3,TROPONINI:3 in the last 72 hours BNP: No results found for this basename: PROBNP:3 in the last 72 hours D-Dimer: No results found for this basename: DDIMER:2 in the last 72 hours CBG: No results found for this basename: GLUCAP:6 in the last 72  hours Hemoglobin A1C: No results found for this basename: HGBA1C in the last 72 hours Fasting Lipid Panel: No results found for this basename: CHOL,HDL,LDLCALC,TRIG,CHOLHDL,LDLDIRECT in the last 72 hours Thyroid Function Tests:  Basename 01/19/12 1741  TSH 7.747*  T4TOTAL --  FREET4 1.61  T3FREE 1.9*  THYROIDAB --   Anemia Panel: No results found for this basename: VITAMINB12,FOLATE,FERRITIN,TIBC,IRON,RETICCTPCT in the last 72 hours Coagulation:  Basename 01/20/12 0500 01/19/12 0720  LABPROT 20.2* 19.9*  INR 1.69* 1.66*   Urine Drug Screen: Drugs of Abuse  No results found for this basename: labopia,  cocainscrnur,  labbenz,  amphetmu,  thcu,  labbarb    Alcohol Level: No results found for this basename: ETH:2 in the last 72 hours  Micro Results: Recent Results (from the past 240 hour(s))  MRSA PCR SCREENING     Status: Normal   Collection Time   01/17/12  6:54 AM      Component Value Range Status Comment   MRSA by PCR NEGATIVE  NEGATIVE  Final     Studies/Results:  Dg Chest Portable 1 View  01/17/2012  *RADIOLOGY REPORT*  Clinical Data: Chest pain and shortness of breath.  PORTABLE CHEST - 1 VIEW  Comparison: Chest radiograph performed 01/07/2012  Findings: Small to moderate bilateral pleural effusions are noted, similar in appearance to the prior study.  Associated bibasilar airspace opacification is noted.  There is worsening vascular congestion and increased interstitial markings, raising question for underlying pulmonary edema.  There is no evidence of pneumothorax.  The cardiomediastinal silhouette is  enlarged; the patient is status post median sternotomy, with evidence of prior CABG.  An aortic valve replacement is noted.  Calcification is noted within the aortic arch.  No acute osseous abnormalities are seen.  IMPRESSION: Persistent small to moderate bilateral pleural effusions, with associated bibasilar airspace opacification; worsening vascular congestion,  cardiomegaly and increased interstitial markings raise question for underlying pulmonary edema.  Original Report Authenticated By: Tonia Ghent, M.D.     Medications: Scheduled Meds:    . aspirin EC  81 mg Oral Daily  . beta carotene w/minerals  1 tablet Oral BID  . cholecalciferol  400 Units Oral Daily  . docusate sodium  100 mg Oral BID  . folic acid  1 mg Oral Daily  . furosemide  40 mg Intravenous Daily  . levothyroxine  75 mcg Oral Q0700  . metoprolol tartrate  25 mg Oral BID  . mulitivitamin with minerals  1 tablet Oral Daily  . nitroGLYCERIN  2-200 mcg/min Intravenous Once  . potassium chloride SA  20 mEq Oral Daily  . simvastatin  20 mg Oral q1800  . vitamin C  500 mg Oral Daily  . warfarin  5 mg Oral ONCE-1800  . warfarin  7.5 mg Oral ONCE-1800  . warfarin  7.5 mg Oral ONCE-1800   Continuous Infusions:    . sodium chloride 10 mL/hr (01/19/12 1138)  . heparin 1,150 Units/hr (01/20/12 0849)   PRN Meds:.diphenhydrAMINE, magnesium hydroxide  Assessment/Plan: 1. Acute diastolic congestive heart failure: Secondary to volume overload. Patient diuresing well and is clinically improving. Cardiology is following. 2. Severe Hyponatremia: Sodium was 110 on admission. Probably dilutional. Improving. Follow BMP tomorrow. 3. Hypothyroidism: Suboptimal supplementation. We'll increase Synthroid to 88 mcg by mouth daily. Repeat thyroid function tests in 4-6 weeks from hospital discharge. 4. Atrial fibrillation: Currently in sinus rhythm. Subtherapeutic INR. On intravenous heparin bridging until INR is therapeutic especially given her recent aortic valve replacement. 5. Anemia: Stable 6. Status post tissue AVR: Goal INR per cardiology 2-3. IV heparin bridging until INR is therapeutic at goal. 7. Coronary artery disease status post recent CABG: Stable   Disposition:  Discharge home when INR is therapeutic, possibly tomorrow.    Rossi Silvestro 01/20/2012, 12:43 PM

## 2012-01-20 NOTE — Progress Notes (Signed)
Patient ID: Carol [name], female   DOB: 06-22-1928, 78 y.o.   MRN: [id number] Patient ID: Carol [name], female   DOB: 09-Aug-1928, 78 y.o.   MRN: [id number] Subjective:  Clinically much better.  Less SOB, stronger.  No diarrhea. No A fib.  Objective:  Vital Signs in the last 24 hours: BP 91/59  Pulse 70  Temp(Src) 96.9 F (36.1 C) (Oral)  Resp 18  Ht 5\' 4"  (1.626 m)  Wt 68.1 kg (150 lb 2.1 oz)  BMI 25.77 kg/m2  SpO2 98%  Physical Exam: Pleasant  WF NAD Lungs:  Decreased BS esp left base Cardiac:  Regular rhythm, normal S1 and S2, 1-2/5 murmur Extremities:  1+edema present venous stasis Neuro nonfocal BS positive nontender   Intake/Output from previous day: 01/26 0701 - 01/27 0700 In: 1483.8 [P.O.:1080; I.V.:403.8] Out: 3050 [Urine:3050] Filed Weights   01/18/12 0430 01/19/12 0548 01/20/12 0440  Weight: 68.3 kg (150 lb 9.2 oz) 69.6 kg (153 lb 7 oz) 68.1 kg (150 lb 2.1 oz)   Lab Results: Basic Metabolic Panel:  Basename 01/20/12 0500 01/19/12 0720  NA 124* 123*  K 4.2 4.1  CL 83* 80*  CO2 36* 37*  GLUCOSE 89 91  BUN 11 9  CREATININE 0.74 0.65    CBC:  Basename 01/20/12 0500 01/19/12 0720  WBC 9.1 9.7  NEUTROABS -- --  HGB 11.1* 11.8*  HCT 32.9* 34.9*  MCV 85.7 86.6  PLT 177 224    PROTIME: Lab Results  Component Value Date   INR 1.69* 01/20/2012   INR 1.66* 01/19/2012   INR 2.23* 01/18/2012    Telemetry: Reviewed  Sinus with PVC's  Assessment/Plan:  1. Acute diastolic CHF due to volume overload improving Foley out CXR stable 2. A fib in NSR INR should be back Rx in am 3. Hyponatremia improving 4. Recent AVR/CABG normal exam and getting coumadin.  Staples are out  Continue heparin today  D/C home in am     Charlton Haws  01/20/2012, 9:47 AM

## 2012-01-21 DIAGNOSIS — D649 Anemia, unspecified: Secondary | ICD-10-CM | POA: Diagnosis not present

## 2012-01-21 DIAGNOSIS — E871 Hypo-osmolality and hyponatremia: Secondary | ICD-10-CM | POA: Diagnosis not present

## 2012-01-21 DIAGNOSIS — E039 Hypothyroidism, unspecified: Secondary | ICD-10-CM | POA: Diagnosis not present

## 2012-01-21 DIAGNOSIS — I5031 Acute diastolic (congestive) heart failure: Secondary | ICD-10-CM | POA: Diagnosis not present

## 2012-01-21 LAB — BASIC METABOLIC PANEL
BUN: 10 mg/dL (ref 6–23)
CO2: 34 mEq/L — ABNORMAL HIGH (ref 19–32)
Calcium: 8.6 mg/dL (ref 8.4–10.5)
Chloride: 87 mEq/L — ABNORMAL LOW (ref 96–112)
Creatinine, Ser: 0.75 mg/dL (ref 0.50–1.10)
GFR calc Af Amer: 89 mL/min — ABNORMAL LOW (ref >90)
GFR calc non Af Amer: 77 mL/min — ABNORMAL LOW (ref >90)
Glucose, Bld: 106 mg/dL — ABNORMAL HIGH (ref 70–99)
Potassium: 4.3 mEq/L (ref 3.5–5.1)
Sodium: 126 mEq/L — ABNORMAL LOW (ref 135–145)

## 2012-01-21 LAB — PROTIME-INR
INR: 2.36 — ABNORMAL HIGH (ref 0.00–1.49)
Prothrombin Time: 26.2 seconds — ABNORMAL HIGH (ref 11.6–15.2)

## 2012-01-21 LAB — HEPARIN LEVEL (UNFRACTIONATED): Heparin Unfractionated: 0.6 IU/mL (ref 0.30–0.70)

## 2012-01-21 LAB — CBC
HCT: 32.7 % — ABNORMAL LOW (ref 36.0–46.0)
Hemoglobin: 10.9 g/dL — ABNORMAL LOW (ref 12.0–15.0)
MCH: 28.8 pg (ref 26.0–34.0)
MCHC: 33.3 g/dL (ref 30.0–36.0)
MCV: 86.3 fL (ref 78.0–100.0)
Platelets: 155 10*3/uL (ref 150–400)
RBC: 3.79 MIL/uL — ABNORMAL LOW (ref 3.87–5.11)
RDW: 18.3 % — ABNORMAL HIGH (ref 11.5–15.5)
WBC: 9.3 10*3/uL (ref 4.0–10.5)

## 2012-01-21 MED ORDER — METOPROLOL TARTRATE 50 MG PO TABS: 25.0000 mg | ORAL_TABLET | Freq: Two times a day (BID) | ORAL | Status: DC

## 2012-01-21 MED ORDER — WARFARIN SODIUM 5 MG PO TABS
5.0000 mg | ORAL_TABLET | Freq: Once | ORAL | Status: DC
  Filled 2012-01-21: qty 1

## 2012-01-21 MED ORDER — FOLIC ACID 1 MG PO TABS: 1.0000 mg | ORAL_TABLET | Freq: Every day | ORAL | Status: DC

## 2012-01-21 MED ORDER — LEVOTHYROXINE SODIUM 88 MCG PO TABS: 88.0000 ug | ORAL_TABLET | ORAL | Status: DC

## 2012-01-21 NOTE — Progress Notes (Signed)
ANTICOAGULATION CONSULT NOTE - Follow Up Consult  Pharmacy Consult for Coumadin, Heparin Indication: atrial fibrillation  Allergies  Allergen Reactions  . Ibuprofen     Hx of GI bleed  . Tape Rash    Reaction:Blisters (can use paper tape)   Patient Measurements: Height: 5\' 4"  (162.6 cm) Weight: 147 lb (66.679 kg) IBW/kg (Calculated) : 54.7  Heparin dosing weight: 70Kg  Vital Signs: Temp: 97.9 F (36.6 C) (01/28 0450) Temp src: Oral (01/28 0450) BP: 99/59 mmHg (01/28 0950) Pulse Rate: 75  (01/28 0950)  Labs:  Basename 01/21/12 0500 01/20/12 1310 01/20/12 0500 01/19/12 0720  HGB 10.9* -- 11.1* --  HCT 32.7* -- 32.9* 34.9*  PLT 155 -- 177 224  APTT -- -- -- --  LABPROT 26.2* -- 20.2* 19.9*  INR 2.36* -- 1.69* 1.66*  HEPARINUNFRC 0.60 0.59 0.40 --  CREATININE 0.75 -- 0.74 0.65  CKTOTAL -- -- -- --  CKMB -- -- -- --  TROPONINI -- -- -- --   Estimated Creatinine Clearance: 50.9 ml/min (by C-G formula based on Cr of 0.75).  Medications:  Scheduled:     . aspirin EC  81 mg Oral Daily  . beta carotene w/minerals  1 tablet Oral BID  . cholecalciferol  400 Units Oral Daily  . docusate sodium  100 mg Oral BID  . folic acid  1 mg Oral Daily  . furosemide  40 mg Intravenous Daily  . levothyroxine  88 mcg Oral Q0700  . metoprolol tartrate  25 mg Oral BID  . mulitivitamin with minerals  1 tablet Oral Daily  . nitroGLYCERIN  2-200 mcg/min Intravenous Once  . potassium chloride SA  20 mEq Oral Daily  . simvastatin  20 mg Oral q1800  . vitamin C  500 mg Oral Daily  . warfarin  7.5 mg Oral ONCE-1800  . DISCONTD: levothyroxine  75 mcg Oral Q0700   Assessment: 78 y/o female patient on chronic coumadin for h/o afib, INR was supratherapeutic on admit, now subtherapeutic after doses held x3 days.  She has h/o AVR but this is tissue valve so standard goal INR.  INR today is 2.36 and Heparin level = 0.60.  Noted plans for discharge by Dr. Donnie Aho.    Goal of Therapy:  INR  2-3 Heparin level 0.3-0.7 iu/ml   Plan:  1. Will discontinue IV heparin and heparin labs. 2.  Will give Warfarin 5 mg today if not discharged 3.  F/U am labs if not discharged.  Nadara Mustard, PharmD., MS Clinical Pharmacist Pager [phone]. 01/21/2012 10:39 AM

## 2012-01-21 NOTE — Progress Notes (Signed)
01/21/2012 [facility], Bosie Clos SPARKS Case Management Note [phone]    CARE MANAGEMENT NOTE 01/21/2012  Patient:  XINYI, BATTON   Account Number:  [id number]  Date Initiated:  01/21/2012  Documentation initiated by:  Fransico Michael  Subjective/Objective Assessment:   admitted on 01/17/12 with CHF     Action/Plan:   Prior to admission, patient lived and was assisted with independent with ADLs   Anticipated DC Date:  01/21/2012   Anticipated DC Plan:  HOME W HOME HEALTH SERVICES      DC Planning Services  CM consult      Choice offered to / List presented to:             Status of service:  Completed, signed off Medicare Important Message given?   (If response is "NO", the following Medicare IM given date fields will be blank) Date Medicare IM given:   Date Additional Medicare IM given:    Discharge Disposition:    Per UR Regulation:  Reviewed for med. necessity/level of care/duration of stay  Comments:  01/21/12- 1350-J.Lutricia Horsfall [phone]      78yo female admitted on 01/17/12 with CHF. Prior to admission, patient lived at home and was assisted with ADLs. Pt was active with [place] home health services. Patient to be discharged home today with resumption of home health services. Debbie, RN with [facility] notified of patient's discharge. No further discharge needs identified.

## 2012-01-21 NOTE — Progress Notes (Signed)
Pt d/c's home  2:23 PM. All meds/ instructions/ dsg care instructions explained to patient per d/c instructions.Prescriptions were sent to patients pharmacy by MD. Pt verbalized understanding. Family at bedside. Emergency numbers reviewed. Dimas Chyle R.

## 2012-01-21 NOTE — Progress Notes (Signed)
Subjective:  Clinically much better.  Less SOB, stronger.  No diarrhea. No A fib.  Objective:  Vital Signs in the last 24 hours: BP 99/59  Pulse 75  Temp(Src) 97.9 F (36.6 C) (Oral)  Resp 19  Ht 5\' 4"  (1.626 m)  Wt 66.679 kg (147 lb)  BMI 25.23 kg/m2  SpO2 95%  Physical Exam: Pleasant  WF NAD Lungs:  Decreased BS esp left base Cardiac:  Regular rhythm, normal S1 and S2, 1-2/5 murmur Extremities:  Trace edema present  Intake/Output from previous day: 01/27 0701 - 01/28 0700 In: 704 [P.O.:360; I.V.:344] Out: 1200 [Urine:1200] Filed Weights   01/20/12 0440 01/21/12 0001 01/21/12 0450  Weight: 68.1 kg (150 lb 2.1 oz) 66.679 kg (147 lb) 66.679 kg (147 lb)   Lab Results: Basic Metabolic Panel:  Basename 01/21/12 0500 01/20/12 0500  NA 126* 124*  K 4.3 4.2  CL 87* 83*  CO2 34* 36*  GLUCOSE 106* 89  BUN 10 11  CREATININE 0.75 0.74    CBC:  Basename 01/21/12 0500 01/20/12 0500  WBC 9.3 9.1  NEUTROABS -- --  HGB 10.9* 11.1*  HCT 32.7* 32.9*  MCV 86.3 85.7  PLT 155 177    PROTIME: Lab Results  Component Value Date   INR 2.36* 01/21/2012   INR 1.69* 01/20/2012   INR 1.66* 01/19/2012    Telemetry: Reviewed  Sinus with PVC's  Assessment/Plan:  1. Acute diastolic CHF due to volume overload resolved 2. A fib in NSR 3. Hyponatremia improving 4. Recent AVR/CABG  Rec:  OK for d/c, I need to see in one week.  Instructed to weigh daily at home and call if weight gain of 3 pounds.  Darden Palmer.  MD [facility] 01/21/2012, 10:02 AM

## 2012-01-21 NOTE — Discharge Summary (Signed)
Discharge Summary  Carol [name] MR#: [id number]  DOB:08/06/1928  Date of Admission: 01/17/2012 Date of Discharge: 01/21/2012  Patient's PCP: Hollice Espy, MD, MD  Attending Physician:HONGALGI,ANAND  Consults: 1. Cardiology: Darden Palmer., MD 2. Cardiothoracic surgery: Dr. Evelene Croon  Discharge Diagnoses: 1. Acute diastolic congestive heart failure 2. Severe hyponatremia 3. Status post recent tissue aortic valve replacement and coronary artery bypass graft. 4. Coagulopathy 5. Atrial fibrillation 6. Hypothyroidism 7. Coronary artery disease      Brief Admitting History and Physical Carol [name] is an 78 y.o. female with history of coronary disease and critical aortic stenosis, status post recent CABG and pericardial tissues aVR, atrial fibrillation postop on Coumadin and amiodarone, left upper lobe lung resection for pulmonary nodule, history of hypothyroidism on supplement, history of congestive heart failure, discharge from the hospital on Lasix and Zaroxolyn, presents to the emergency room for progressive shortness of breath, orthopnea, and lower leg swelling. Evaluation in emergency room included a chest x-ray which showed vascular congestion, almost pulmonary edema, and residual small bilateral pleural effusions. Her baseline BNP was in the 400, it is now 7000. She also has supratherapeutic INR of 5.17, leukocytosis with a white count of 16,000, hemoglobin of 11.4 g per decaliter, and severe hyponatremic (110), hypochloremic (71) metabolic alkalosis with a bicarbonate of 30.    Discharge Medications Current Discharge Medication List    CONTINUE these medications which have CHANGED   Details  folic acid (FOLVITE) 1 MG tablet Take 1 tablet (1 mg total) by mouth daily.    levothyroxine (SYNTHROID, LEVOTHROID) 88 MCG tablet Take 1 tablet (88 mcg total) by mouth every morning. Qty: 30 tablet, Refills: 0    metoprolol (LOPRESSOR) 50 MG tablet Take 0.5  tablets (25 mg total) by mouth 2 (two) times daily.      CONTINUE these medications which have NOT CHANGED   Details  aspirin EC 81 MG tablet Take 81 mg by mouth daily.      atorvastatin (LIPITOR) 20 MG tablet Take 20 mg by mouth at bedtime.      Calcium Carbonate-Vitamin D (CALCIUM 600 + D PO) Take 1 tablet by mouth 2 (two) times daily.      cholecalciferol (VITAMIN D) 400 UNITS TABS Take 400 Units by mouth daily.      furosemide (LASIX) 40 MG tablet Take 1 tablet (40 mg total) by mouth daily. Qty: 30 tablet, Refills: 0    gabapentin (NEURONTIN) 300 MG capsule Take 300 mg by mouth 3 (three) times daily.      Lutein 20 MG CAPS Take 20 mg by mouth daily.      !! Multiple Vitamin (MULITIVITAMIN WITH MINERALS) TABS Take 1 tablet by mouth daily.      !! Multiple Vitamins-Minerals (OCUVITE PRESERVISION PO) Take 1 tablet by mouth 2 (two) times daily.      potassium chloride SA (K-DUR,KLOR-CON) 20 MEQ tablet Take 1 tablet (20 mEq total) by mouth daily. Qty: 30 tablet, Refills: 0    vitamin C (ASCORBIC ACID) 500 MG tablet Take 500 mg by mouth daily.      warfarin (COUMADIN) 5 MG tablet Take 5 mg by mouth daily.     !! - Potential duplicate medications found. Please discuss with provider.    STOP taking these medications     amiodarone (PACERONE) 200 MG tablet Comments:  Reason for Stopping:       OVER THE COUNTER MEDICATION Comments:  Reason for Stopping:  Hospital Course: 1. Acute diastolic congestive heart failure: Secondary to volume overload. Cardiology and cardiothoracic surgery were consulted. She was diuresed with intravenous Lasix with good effect. Repeat 2-D echocardiogram showed normal systolic function with EF of 55-60% with moderate concentric hypertrophy. She is currently asymptomatic and will continue her home dose of Lasix. 2. Severe Hyponatremia: Sodium was 110 on admission. Probably dilutional secondary to volume overload and secondary to medication  citrate that she took for constipation. Improved with diuresis. Followup outpatient. 3. Hypothyroidism: Suboptimal supplementation. Increased Synthroid to 88 mcg by mouth daily. Repeat TSH in 4-6 weeks from hospital discharge. 4. Atrial fibrillation: Currently in sinus rhythm. Amiodarone was discontinued by the cardiologist. Her Coumadin was held and then her INR became subtherapeutic. She was bridged with intravenous heparin. INR today is therapeutic. She will be discharged on her home dose of Coumadin and is to followup with her cardiologist on Wednesday, 01/23/2012 for repeat PT/INR check and adjustment of Coumadin dose. 5.  Anemia: Stable 6. Status post tissue AVR: Goal INR per cardiology 2-3. Her staples were removed. The wound site on the left leg looks clean dry and intact. 7. Coronary artery disease status post recent CABG: Stable   Day of Discharge BP 124/48  Pulse 76  Temp(Src) 98.3 F (36.8 C) (Oral)  Resp 18  Ht 5\' 4"  (1.626 m)  Wt 66.679 kg (147 lb)  BMI 25.23 kg/m2  SpO2 95%  General Exam: Comfortable. Sitting in chair and is in no obvious distress.  Respiratory System: clear. No increased work of breathing.  Cardiovascular System: First and second heart sounds heard. Regular rate and rhythm. No JVD/murmurs. Telemetry shows sinus rhythm with first degree AV block. Gastrointestinal System: Abdomen is non distended, soft and normal bowel sounds heard.  Central Nervous System: Alert and oriented. No focal neurological deficits.  Extremities: Left wound appears clean dry and intact.  Results for orders placed during the hospital encounter of 01/17/12 (from the past 48 hour(s))  HEPARIN LEVEL (UNFRACTIONATED)     Status: Abnormal   Collection Time   01/19/12  5:41 PM      Component Value Range Comment   Heparin Unfractionated 0.25 (*) 0.30 - 0.70 (IU/mL)   TSH     Status: Abnormal   Collection Time   01/19/12  5:41 PM      Component Value Range Comment   TSH 7.747 (*) 0.350  - 4.500 (uIU/mL)   T4, FREE     Status: Normal   Collection Time   01/19/12  5:41 PM      Component Value Range Comment   Free T4 1.61  0.80 - 1.80 (ng/dL)   T3, FREE     Status: Abnormal   Collection Time   01/19/12  5:41 PM      Component Value Range Comment   T3, Free 1.9 (*) 2.3 - 4.2 (pg/mL)   PROTIME-INR     Status: Abnormal   Collection Time   01/20/12  5:00 AM      Component Value Range Comment   Prothrombin Time 20.2 (*) 11.6 - 15.2 (seconds)    INR 1.69 (*) 0.00 - 1.49    HEPARIN LEVEL (UNFRACTIONATED)     Status: Normal   Collection Time   01/20/12  5:00 AM      Component Value Range Comment   Heparin Unfractionated 0.40  0.30 - 0.70 (IU/mL)   CBC     Status: Abnormal   Collection Time   01/20/12  5:00 AM  Component Value Range Comment   WBC 9.1  4.0 - 10.5 (K/uL)    RBC 3.84 (*) 3.87 - 5.11 (MIL/uL)    Hemoglobin 11.1 (*) 12.0 - 15.0 (g/dL)    HCT 16.1 (*) 09.6 - 46.0 (%)    MCV 85.7  78.0 - 100.0 (fL)    MCH 28.9  26.0 - 34.0 (pg)    MCHC 33.7  30.0 - 36.0 (g/dL)    RDW 04.5 (*) 40.9 - 15.5 (%)    Platelets 177  150 - 400 (K/uL)   BASIC METABOLIC PANEL     Status: Abnormal   Collection Time   01/20/12  5:00 AM      Component Value Range Comment   Sodium 124 (*) 135 - 145 (mEq/L)    Potassium 4.2  3.5 - 5.1 (mEq/L)    Chloride 83 (*) 96 - 112 (mEq/L)    CO2 36 (*) 19 - 32 (mEq/L)    Glucose, Bld 89  70 - 99 (mg/dL)    BUN 11  6 - 23 (mg/dL)    Creatinine, Ser 8.11  0.50 - 1.10 (mg/dL)    Calcium 8.5  8.4 - 10.5 (mg/dL)    GFR calc non Af Amer 77 (*) >90 (mL/min)    GFR calc Af Amer 89 (*) >90 (mL/min)   HEPARIN LEVEL (UNFRACTIONATED)     Status: Normal   Collection Time   01/20/12  1:10 PM      Component Value Range Comment   Heparin Unfractionated 0.59  0.30 - 0.70 (IU/mL)   PROTIME-INR     Status: Abnormal   Collection Time   01/21/12  5:00 AM      Component Value Range Comment   Prothrombin Time 26.2 (*) 11.6 - 15.2 (seconds)    INR 2.36 (*) 0.00  - 1.49    HEPARIN LEVEL (UNFRACTIONATED)     Status: Normal   Collection Time   01/21/12  5:00 AM      Component Value Range Comment   Heparin Unfractionated 0.60  0.30 - 0.70 (IU/mL)   CBC     Status: Abnormal   Collection Time   01/21/12  5:00 AM      Component Value Range Comment   WBC 9.3  4.0 - 10.5 (K/uL)    RBC 3.79 (*) 3.87 - 5.11 (MIL/uL)    Hemoglobin 10.9 (*) 12.0 - 15.0 (g/dL)    HCT 91.4 (*) 78.2 - 46.0 (%)    MCV 86.3  78.0 - 100.0 (fL)    MCH 28.8  26.0 - 34.0 (pg)    MCHC 33.3  30.0 - 36.0 (g/dL)    RDW 95.6 (*) 21.3 - 15.5 (%)    Platelets 155  150 - 400 (K/uL)   BASIC METABOLIC PANEL     Status: Abnormal   Collection Time   01/21/12  5:00 AM      Component Value Range Comment   Sodium 126 (*) 135 - 145 (mEq/L)    Potassium 4.3  3.5 - 5.1 (mEq/L)    Chloride 87 (*) 96 - 112 (mEq/L)    CO2 34 (*) 19 - 32 (mEq/L)    Glucose, Bld 106 (*) 70 - 99 (mg/dL)    BUN 10  6 - 23 (mg/dL)    Creatinine, Ser 0.86  0.50 - 1.10 (mg/dL)    Calcium 8.6  8.4 - 10.5 (mg/dL)    GFR calc non Af Amer 77 (*) >90 (mL/min)    GFR  calc Af Amer 89 (*) >90 (mL/min)     Laboratory Data:  1. CBGs ranged from 107-134 mg/dL. 2. BMP on admission: Sodium 110, potassium 4.6, chloride 71, bicarbonate 31, BUN 14, creatinine 0.61, calcium 8.6 and glucose 170. 3. LFTs only significant for total protein of 5.9. 4. Pro BNP on admission 7080. 5. Cardiac panel on admission: Negative 6. Serum osmolarity 238. 7. Urine osmolarity 149, urine sodium 38, urine creatinine 6.93. 8.   Dg Chest 2 View  01/19/2012  *RADIOLOGY REPORT*  Clinical Data: Follow up CHF  CHEST - 2 VIEW  Comparison: 01/17/2012  Findings: Heart size is moderately enlarged.  Bilateral pleural effusions are again noted.  These are slightly decreased in volume from prior exam.  Interval improvement and interstitial edema.  Left upper lobe scar like opacity is stable from previous exam.  IMPRESSION:  1.  Improvement in CHF pattern.   Original Report Authenticated By: Rosealee Albee, M.D.   Dg Chest 2 View  01/07/2012  *RADIOLOGY REPORT*  Clinical Data: Status post aortic valve replacement, chest pain  CHEST - 2 VIEW  Comparison: 01/02/2012  Findings: Left upper lobe pleural-based spiculated mass, previously evaluated, is stable.  Evidence of median sternotomy, CABG, and aortic valvuloplasty noted.  Slight increase in bilateral small pleural effusions with adjacent probable compressive atelectasis. No pneumothorax.  Mild enlargement of the cardiomediastinal silhouette is stable.  Leftward curvature of the spine centered at L1 again noted.  Apparent interval removal of epicardial pacer wires.  IMPRESSION: Slight increase in small bilateral pleural effusions.  Otherwise stable exam.  Original Report Authenticated By: Harrel Lemon, M.D.   Dg Chest 2 View  01/02/2012  *RADIOLOGY REPORT*  Clinical Data: Follow up CABG and aortic valve replacement, weakness and shortness of breath  CHEST - 2 VIEW  Comparison: Portable chest x-ray of 01/01/2012  Findings: Aeration of the lungs has improved.  There are bilateral pleural effusions present with basilar atelectasis remaining. Nodular opacity in the periphery the left upper lung field with adjacent sutures appears stable. Cardiomegaly is stable.  Median sternotomy sutures are noted.  An aortic valve replacement is present.  IMPRESSION: Improved aeration.  Small effusions and basilar atelectasis remain. Apparent postop opacity is unchanged in the periphery of the left upper lung field.  Original Report Authenticated By: Juline Patch, M.D.   Dg Chest Portable 1 View  01/17/2012  *RADIOLOGY REPORT*  Clinical Data: Chest pain and shortness of breath.  PORTABLE CHEST - 1 VIEW  Comparison: Chest radiograph performed 01/07/2012  Findings: Small to moderate bilateral pleural effusions are noted, similar in appearance to the prior study.  Associated bibasilar airspace opacification is noted.  There is  worsening vascular congestion and increased interstitial markings, raising question for underlying pulmonary edema.  There is no evidence of pneumothorax.  The cardiomediastinal silhouette is enlarged; the patient is status post median sternotomy, with evidence of prior CABG.  An aortic valve replacement is noted.  Calcification is noted within the aortic arch.  No acute osseous abnormalities are seen.  IMPRESSION: Persistent small to moderate bilateral pleural effusions, with associated bibasilar airspace opacification; worsening vascular congestion, cardiomegaly and increased interstitial markings raise question for underlying pulmonary edema.  Original Report Authenticated By: Tonia Ghent, M.D.   Dg Chest Port 1 View  12/31/2011  *RADIOLOGY REPORT*  Clinical Data: Atelectasis and effusions.  PORTABLE CHEST - 1 VIEW  Comparison: 01/06 and 12/29/2011  Findings: No pneumothorax. Sheath remains in the superior vena cava.  Improving  slight atelectasis in the left upper lobe.  Slightly increased small right effusion.  Small left effusion with slight atelectasis at the base, unchanged.  Persistent chronic cardiomegaly.  Vascularity is normal.  IMPRESSION:  1.  Improving atelectasis in the left upper lung zone. 2.  Slight increase in small right effusion.  Original Report Authenticated By: Gwynn Burly, M.D.   Dg Chest Portable 1 View In Am  12/30/2011  *RADIOLOGY REPORT*  Clinical Data: Coronary bypass, aortic valve replacement  PORTABLE CHEST - 1 VIEW  Comparison: 12/29/2011  Findings: Mediastinal drain, Swan-Ganz catheter and left chest tube all removed.  Right IJ vascular sheath remains in stable position. Coronary bypass changes and aortic valve replacement noted.  Heart remains enlarged with vascular congestion and left lower lobe atelectasis / consolidation.  No pneumothorax.  Stable focal left upper lobe opacity, status post left upper lobe wedge resection.  IMPRESSION: Stable postoperative portable  chest exam.  No pneumothorax.  Original Report Authenticated By: Judie Petit. Ruel Favors, M.D.   Dg Chest Portable 1 View In Am  12/29/2011  *RADIOLOGY REPORT*  Clinical Data: Bypass surgery.  PORTABLE CHEST - 1 VIEW  Comparison: Chest x-ray 12/28/2011.  Findings: The endotracheal tube, right-sided chest tube and NG tubes have been removed.  The right IJ Swan-Ganz catheter is stable and the left-sided chest tube is stable.  No pneumothorax.  Mild vascular congestion and areas of atelectasis persist.  Slight improved left upper lobe opacity.  IMPRESSION: Removal of ET, NG and right-sided chest tubes. No pneumothorax. Mild vascular congestion and minimal areas of atelectasis.  Original Report Authenticated By: P. Loralie Champagne, M.D.   Dg Chest Portable 1 View  12/28/2011  *RADIOLOGY REPORT*  Clinical Data: Postop CABG and valve replacement  PORTABLE CHEST - 1 VIEW  Comparison: 12/20/2011  Findings: There is a Swan-Ganz catheter with tip in the main pulmonary artery.  The ET tube tip is above the carina.  Bilateral chest tubes are noted without evidence for pneumothorax.  Increased in the left upper lobe opacity which now measures approximately 4.5 cm.  IMPRESSION:  1.  Postoperative changes compatible with CABG procedure. 2.  Bilateral chest tubes without pneumothorax. 3.  Increase in left upper lobe opacity which may reflect postoperative change.  Original Report Authenticated By: Rosealee Albee, M.D.   Dg Chest Port 1v Same Day  01/01/2012  *RADIOLOGY REPORT*  Clinical Data: Follow-up CABG.  PORTABLE CHEST - 1 VIEW SAME DAY  Comparison: 12/31/2011  Findings: Changes of CABG and valve replacement.  Cardiomegaly with vascular congestion.  Increasing interstitial prominence, likely edema.  There is bibasilar atelectasis and small bilateral effusions.  No pneumothorax.  IMPRESSION: Increasing interstitial prominence, likely edema.  Continued bibasilar atelectasis and small effusions.  Original Report Authenticated By:  Cyndie Chime, M.D.    2-D echocardiogram:  Study Conclusions  - Left ventricle: The cavity size was normal. There was moderate concentric hypertrophy. Systolic function was normal. The estimated ejection fraction was in the range of 55% to 60%. Wall motion was normal; there were no regional wall motion abnormalities. - Aortic valve: A prosthesis was present and functioning normally. The prosthesis had a normal range of motion. The sewing ring appeared normal, had no rocking motion, and showed no evidence of dehiscence. - Left atrium: The atrium was mildly dilated. - Pericardium, extracardiac: There was a left pleural effusion.     Disposition: Discharged home in stable condition  Diet: Heart healthy  Activity: Increase activity gradually. We'll resume  home health services from previous discharge including PT, OT and aide.  Follow-up Appts: Discharge Orders    Future Appointments: Provider: Department: Dept Phone: Center:   01/24/2012 12:30 PM Delight Ovens, MD [facility] [id number]     Future Orders Please Complete By Expires   Diet - low sodium heart healthy      Increase activity slowly      Call MD for:  temperature >100.4      Call MD for:  severe uncontrolled pain      Call MD for:  redness, tenderness, or signs of infection (pain, swelling, redness, odor or green/yellow discharge around incision site)      Call MD for:  difficulty breathing, headache or visual disturbances      (HEART FAILURE PATIENTS) Call MD:  Anytime you have any of the following symptoms: 1) 3 pound weight gain in 24 hours or 5 pounds in 1 week 2) shortness of breath, with or without a dry hacking cough 3) swelling in the hands, feet or stomach 4) if you have to sleep on extra pillows at night in order to breathe.         TESTS THAT NEED FOLLOW-UP PT, INR, BMP and CBC on 30th of January 2013 at Dr.Tilley's office.  Time spent on discharge, talking to the patient, and  coordinating care: 40 mins.   SignedMarcellus Scott, MD 01/21/2012, 3:39 PM

## 2012-01-23 ENCOUNTER — Other Ambulatory Visit: Payer: Self-pay | Admitting: Cardiothoracic Surgery

## 2012-01-23 DIAGNOSIS — R269 Unspecified abnormalities of gait and mobility: Secondary | ICD-10-CM | POA: Diagnosis not present

## 2012-01-23 DIAGNOSIS — I10 Essential (primary) hypertension: Secondary | ICD-10-CM | POA: Diagnosis not present

## 2012-01-23 DIAGNOSIS — I2510 Atherosclerotic heart disease of native coronary artery without angina pectoris: Secondary | ICD-10-CM

## 2012-01-23 DIAGNOSIS — Z483 Aftercare following surgery for neoplasm: Secondary | ICD-10-CM | POA: Diagnosis not present

## 2012-01-23 DIAGNOSIS — Z48812 Encounter for surgical aftercare following surgery on the circulatory system: Secondary | ICD-10-CM | POA: Diagnosis not present

## 2012-01-24 ENCOUNTER — Ambulatory Visit (INDEPENDENT_AMBULATORY_CARE_PROVIDER_SITE_OTHER): Payer: Self-pay | Admitting: Cardiothoracic Surgery

## 2012-01-24 ENCOUNTER — Ambulatory Visit
Admission: RE | Admit: 2012-01-24 | Discharge: 2012-01-24 | Disposition: A | Discharge status: Home / Self Care | Payer: Medicare Other | Source: Ambulatory Visit | Attending: Cardiothoracic Surgery | Admitting: Cardiothoracic Surgery

## 2012-01-24 ENCOUNTER — Encounter: Payer: Self-pay | Admitting: Cardiothoracic Surgery

## 2012-01-24 VITALS — BP 118/61 | HR 76 | Resp 20 | Ht 63.0 in | Wt 142.0 lb

## 2012-01-24 DIAGNOSIS — Z952 Presence of prosthetic heart valve: Secondary | ICD-10-CM

## 2012-01-24 DIAGNOSIS — J90 Pleural effusion, not elsewhere classified: Secondary | ICD-10-CM | POA: Diagnosis not present

## 2012-01-24 DIAGNOSIS — I2510 Atherosclerotic heart disease of native coronary artery without angina pectoris: Secondary | ICD-10-CM

## 2012-01-24 DIAGNOSIS — T8149XA Infection following a procedure, other surgical site, initial encounter: Secondary | ICD-10-CM

## 2012-01-24 DIAGNOSIS — R0602 Shortness of breath: Secondary | ICD-10-CM | POA: Diagnosis not present

## 2012-01-24 DIAGNOSIS — T8140XA Infection following a procedure, unspecified, initial encounter: Secondary | ICD-10-CM

## 2012-01-24 DIAGNOSIS — D381 Neoplasm of uncertain behavior of trachea, bronchus and lung: Secondary | ICD-10-CM

## 2012-01-24 DIAGNOSIS — I350 Nonrheumatic aortic (valve) stenosis: Secondary | ICD-10-CM

## 2012-01-24 DIAGNOSIS — Z954 Presence of other heart-valve replacement: Secondary | ICD-10-CM

## 2012-01-24 DIAGNOSIS — Z951 Presence of aortocoronary bypass graft: Secondary | ICD-10-CM

## 2012-01-24 DIAGNOSIS — J984 Other disorders of lung: Secondary | ICD-10-CM | POA: Diagnosis not present

## 2012-01-24 DIAGNOSIS — I359 Nonrheumatic aortic valve disorder, unspecified: Secondary | ICD-10-CM

## 2012-01-24 NOTE — Progress Notes (Signed)
  HPI: Patient returns for routine postoperative follow-up having undergone Cabg.She was readmitted for volume overload and has responded well to diuresis. Additionally, she has superficial dehiscence/ infection from LLE OVH incision. She is doing well keeping elevated, and responding to diuretic with decreasing edema since discharge.    Current Outpatient Prescriptions  Medication Sig Dispense Refill  . amiodarone (PACERONE) 200 MG tablet Take 200 mg by mouth daily.      Marland Kitchen aspirin EC 81 MG tablet Take 81 mg by mouth daily.        Marland Kitchen atorvastatin (LIPITOR) 20 MG tablet Take 20 mg by mouth at bedtime.        . Calcium Carbonate-Vitamin D (CALCIUM 600 + D PO) Take 1 tablet by mouth 2 (two) times daily.        . cholecalciferol (VITAMIN D) 400 UNITS TABS Take 400 Units by mouth daily.        . folic acid (FOLVITE) 1 MG tablet Take 1 tablet (1 mg total) by mouth daily.      . furosemide (LASIX) 40 MG tablet Take 1 tablet (40 mg total) by mouth daily.  30 tablet  0  . levothyroxine (SYNTHROID, LEVOTHROID) 88 MCG tablet Take 1 tablet (88 mcg total) by mouth every morning.  30 tablet  0  . Lutein 20 MG CAPS Take 20 mg by mouth daily.        . metoprolol (LOPRESSOR) 50 MG tablet Take 0.5 tablets (25 mg total) by mouth 2 (two) times daily.      . Multiple Vitamin (MULITIVITAMIN WITH MINERALS) TABS Take 1 tablet by mouth daily.        . Multiple Vitamins-Minerals (OCUVITE PRESERVISION PO) Take 1 tablet by mouth 2 (two) times daily.        . potassium chloride SA (K-DUR,KLOR-CON) 20 MEQ tablet Take 1 tablet (20 mEq total) by mouth daily.  30 tablet  0  . vitamin C (ASCORBIC ACID) 500 MG tablet Take 500 mg by mouth daily.        Marland Kitchen warfarin (COUMADIN) 5 MG tablet Take 5 mg by mouth daily.      Marland Kitchen gabapentin (NEURONTIN) 300 MG capsule Take 300 mg by mouth 3 (three) times daily.          Physical Exam: Lower extremity- Scant slighlty purulent drainage, minimal erethema, minor LE edema. The right evh site at  knee has superficial dehiscence with scant drainage as well.  Chest incision: Healing well without evidence of infection.   Diagnostic Tests: Chest x-ray continues to show an improving pattern.  Impression: Superficial OVH L leg/R EVH site dehiscence with minor drainage.  Plan: We will continue with saline wet to dry dressings. We'll make arrangements for them to be twice a day. We will see her back in the office in one week to recheck.

## 2012-01-24 NOTE — Patient Instructions (Signed)
Continue wound care  Return one week

## 2012-01-25 DIAGNOSIS — I10 Essential (primary) hypertension: Secondary | ICD-10-CM | POA: Diagnosis not present

## 2012-01-25 DIAGNOSIS — Z483 Aftercare following surgery for neoplasm: Secondary | ICD-10-CM | POA: Diagnosis not present

## 2012-01-25 DIAGNOSIS — Z48812 Encounter for surgical aftercare following surgery on the circulatory system: Secondary | ICD-10-CM | POA: Diagnosis not present

## 2012-01-25 DIAGNOSIS — R269 Unspecified abnormalities of gait and mobility: Secondary | ICD-10-CM | POA: Diagnosis not present

## 2012-01-25 DIAGNOSIS — I2510 Atherosclerotic heart disease of native coronary artery without angina pectoris: Secondary | ICD-10-CM | POA: Diagnosis not present

## 2012-01-25 LAB — FUNGUS CULTURE W SMEAR: Fungal Smear: NONE SEEN

## 2012-01-26 DIAGNOSIS — I2510 Atherosclerotic heart disease of native coronary artery without angina pectoris: Secondary | ICD-10-CM | POA: Diagnosis not present

## 2012-01-26 DIAGNOSIS — R269 Unspecified abnormalities of gait and mobility: Secondary | ICD-10-CM | POA: Diagnosis not present

## 2012-01-26 DIAGNOSIS — Z483 Aftercare following surgery for neoplasm: Secondary | ICD-10-CM | POA: Diagnosis not present

## 2012-01-26 DIAGNOSIS — I10 Essential (primary) hypertension: Secondary | ICD-10-CM | POA: Diagnosis not present

## 2012-01-26 DIAGNOSIS — Z48812 Encounter for surgical aftercare following surgery on the circulatory system: Secondary | ICD-10-CM | POA: Diagnosis not present

## 2012-01-27 DIAGNOSIS — I2510 Atherosclerotic heart disease of native coronary artery without angina pectoris: Secondary | ICD-10-CM | POA: Diagnosis not present

## 2012-01-27 DIAGNOSIS — I10 Essential (primary) hypertension: Secondary | ICD-10-CM | POA: Diagnosis not present

## 2012-01-27 DIAGNOSIS — Z483 Aftercare following surgery for neoplasm: Secondary | ICD-10-CM | POA: Diagnosis not present

## 2012-01-27 DIAGNOSIS — Z48812 Encounter for surgical aftercare following surgery on the circulatory system: Secondary | ICD-10-CM | POA: Diagnosis not present

## 2012-01-27 DIAGNOSIS — R269 Unspecified abnormalities of gait and mobility: Secondary | ICD-10-CM | POA: Diagnosis not present

## 2012-01-28 DIAGNOSIS — R269 Unspecified abnormalities of gait and mobility: Secondary | ICD-10-CM | POA: Diagnosis not present

## 2012-01-28 DIAGNOSIS — Z483 Aftercare following surgery for neoplasm: Secondary | ICD-10-CM | POA: Diagnosis not present

## 2012-01-28 DIAGNOSIS — Z48812 Encounter for surgical aftercare following surgery on the circulatory system: Secondary | ICD-10-CM | POA: Diagnosis not present

## 2012-01-28 DIAGNOSIS — I2510 Atherosclerotic heart disease of native coronary artery without angina pectoris: Secondary | ICD-10-CM | POA: Diagnosis not present

## 2012-01-28 DIAGNOSIS — I10 Essential (primary) hypertension: Secondary | ICD-10-CM | POA: Diagnosis not present

## 2012-01-29 ENCOUNTER — Other Ambulatory Visit: Payer: Self-pay | Admitting: Cardiothoracic Surgery

## 2012-01-29 DIAGNOSIS — Z48812 Encounter for surgical aftercare following surgery on the circulatory system: Secondary | ICD-10-CM | POA: Diagnosis not present

## 2012-01-29 DIAGNOSIS — Z954 Presence of other heart-valve replacement: Secondary | ICD-10-CM | POA: Diagnosis not present

## 2012-01-29 DIAGNOSIS — I4891 Unspecified atrial fibrillation: Secondary | ICD-10-CM | POA: Diagnosis not present

## 2012-01-29 DIAGNOSIS — R269 Unspecified abnormalities of gait and mobility: Secondary | ICD-10-CM | POA: Diagnosis not present

## 2012-01-29 DIAGNOSIS — Z7901 Long term (current) use of anticoagulants: Secondary | ICD-10-CM | POA: Diagnosis not present

## 2012-01-29 DIAGNOSIS — I10 Essential (primary) hypertension: Secondary | ICD-10-CM | POA: Diagnosis not present

## 2012-01-29 DIAGNOSIS — E785 Hyperlipidemia, unspecified: Secondary | ICD-10-CM | POA: Diagnosis not present

## 2012-01-29 DIAGNOSIS — I2510 Atherosclerotic heart disease of native coronary artery without angina pectoris: Secondary | ICD-10-CM

## 2012-01-29 DIAGNOSIS — D6489 Other specified anemias: Secondary | ICD-10-CM | POA: Diagnosis not present

## 2012-01-29 DIAGNOSIS — Z483 Aftercare following surgery for neoplasm: Secondary | ICD-10-CM | POA: Diagnosis not present

## 2012-01-29 DIAGNOSIS — I359 Nonrheumatic aortic valve disorder, unspecified: Secondary | ICD-10-CM | POA: Diagnosis not present

## 2012-01-30 DIAGNOSIS — R269 Unspecified abnormalities of gait and mobility: Secondary | ICD-10-CM | POA: Diagnosis not present

## 2012-01-30 DIAGNOSIS — Z483 Aftercare following surgery for neoplasm: Secondary | ICD-10-CM | POA: Diagnosis not present

## 2012-01-30 DIAGNOSIS — I10 Essential (primary) hypertension: Secondary | ICD-10-CM | POA: Diagnosis not present

## 2012-01-30 DIAGNOSIS — I2510 Atherosclerotic heart disease of native coronary artery without angina pectoris: Secondary | ICD-10-CM | POA: Diagnosis not present

## 2012-01-30 DIAGNOSIS — Z48812 Encounter for surgical aftercare following surgery on the circulatory system: Secondary | ICD-10-CM | POA: Diagnosis not present

## 2012-01-31 ENCOUNTER — Ambulatory Visit (INDEPENDENT_AMBULATORY_CARE_PROVIDER_SITE_OTHER): Payer: Self-pay | Admitting: Cardiothoracic Surgery

## 2012-01-31 ENCOUNTER — Encounter: Payer: Self-pay | Admitting: Cardiothoracic Surgery

## 2012-01-31 ENCOUNTER — Ambulatory Visit
Admission: RE | Admit: 2012-01-31 | Discharge: 2012-01-31 | Disposition: A | Discharge status: Home / Self Care | Payer: Medicare Other | Source: Ambulatory Visit | Attending: Cardiothoracic Surgery | Admitting: Cardiothoracic Surgery

## 2012-01-31 VITALS — BP 134/80 | HR 97 | Resp 16 | Ht 63.0 in | Wt 142.0 lb

## 2012-01-31 DIAGNOSIS — I517 Cardiomegaly: Secondary | ICD-10-CM | POA: Diagnosis not present

## 2012-01-31 DIAGNOSIS — I359 Nonrheumatic aortic valve disorder, unspecified: Secondary | ICD-10-CM

## 2012-01-31 DIAGNOSIS — I2510 Atherosclerotic heart disease of native coronary artery without angina pectoris: Secondary | ICD-10-CM

## 2012-01-31 DIAGNOSIS — Z48812 Encounter for surgical aftercare following surgery on the circulatory system: Secondary | ICD-10-CM | POA: Diagnosis not present

## 2012-01-31 DIAGNOSIS — Z09 Encounter for follow-up examination after completed treatment for conditions other than malignant neoplasm: Secondary | ICD-10-CM

## 2012-01-31 DIAGNOSIS — Z483 Aftercare following surgery for neoplasm: Secondary | ICD-10-CM | POA: Diagnosis not present

## 2012-01-31 DIAGNOSIS — J9819 Other pulmonary collapse: Secondary | ICD-10-CM | POA: Diagnosis not present

## 2012-01-31 DIAGNOSIS — I10 Essential (primary) hypertension: Secondary | ICD-10-CM | POA: Diagnosis not present

## 2012-01-31 DIAGNOSIS — J90 Pleural effusion, not elsewhere classified: Secondary | ICD-10-CM | POA: Diagnosis not present

## 2012-01-31 DIAGNOSIS — R269 Unspecified abnormalities of gait and mobility: Secondary | ICD-10-CM | POA: Diagnosis not present

## 2012-01-31 NOTE — Progress Notes (Signed)
[address]            [place] [address]          [phone]     HPI: Patient returns for routine postoperative follow-up having undergone Cabg.She was readmitted for volume overload and has responded well to diuresis. Additionally, she has superficial dehiscence/ infection from LLE OVH incision. She is doing well keeping elevated, and responding to diuretic with decreasing edema since discharge. She returns today for followup wound check.   Current Outpatient Prescriptions  Medication Sig Dispense Refill  . amiodarone (PACERONE) 200 MG tablet Take 200 mg by mouth daily.      Marland Kitchen aspirin EC 81 MG tablet Take 81 mg by mouth daily.        Marland Kitchen atorvastatin (LIPITOR) 20 MG tablet Take 20 mg by mouth at bedtime.        . Calcium Carbonate-Vitamin D (CALCIUM 600 + D PO) Take 1 tablet by mouth 2 (two) times daily.        . cholecalciferol (VITAMIN D) 400 UNITS TABS Take 400 Units by mouth daily.        . folic acid (FOLVITE) 1 MG tablet Take 1 tablet (1 mg total) by mouth daily.      . furosemide (LASIX) 40 MG tablet Take 1 tablet (40 mg total) by mouth daily.  30 tablet  0  . gabapentin (NEURONTIN) 300 MG capsule Take 300 mg by mouth 3 (three) times daily.        Marland Kitchen levothyroxine (SYNTHROID, LEVOTHROID) 88 MCG tablet Take 1 tablet (88 mcg total) by mouth every morning.  30 tablet  0  . Lutein 20 MG CAPS Take 20 mg by mouth daily.        . metoprolol (LOPRESSOR) 50 MG tablet Take 0.5 tablets (25 mg total) by mouth 2 (two) times daily.      . Multiple Vitamin (MULITIVITAMIN WITH MINERALS) TABS Take 1 tablet by mouth daily.        . Multiple Vitamins-Minerals (OCUVITE PRESERVISION PO) Take 1 tablet by mouth 2 (two) times daily.        . potassium chloride SA (K-DUR,KLOR-CON) 20 MEQ tablet Take 1 tablet (20 mEq total) by mouth daily.  30 tablet  0  . vitamin C (ASCORBIC ACID) 500 MG tablet Take 500 mg by mouth daily.        Marland Kitchen warfarin (COUMADIN) 5 MG tablet Take 5 mg  by mouth daily.        Physical Exam: Lower extremity- the left lower extremity below the knee continues with local wet-to-dry wound care the bottom half of the incision is opened granulation tissue is appearing there's not appear to be any active infection. Scant slighlty purulent drainage, minimal erethema, lower extremity edema in both legs is much improved. The right evh site at knee has superficial dehiscence with scant drainage as well.  Chest incision: Healing well without evidence of infection.  Some debridement of the incision was done today we'll plan to see her back again in one week for wound check  Diagnostic Tests: Dg Chest 2 View  01/31/2012  *RADIOLOGY REPORT*  Clinical Data: History of CABG on 12/21/2011.  X smoker.  Weakness, but otherwise currently doing well.  CHEST - 2 VIEW  Comparison: Comparison and chest x-ray 01/24/2012.  Findings: Blunting the costophrenic sulci bilaterally suggestive of small bilateral pleural effusions.  Minimal bibasilar subsegmental atelectasis.  No definite focal airspace consolidation. Postoperative changes of wedge resection in the lateral aspect of the left upper lobe again noted.  Mild enlargement of the cardiac silhouette, similar priors. Postoperative changes of median sternotomy for CABG and aortic valve replacement (stented bioprosthesis).  Atherosclerotic calcifications in the arch of the aorta.  IMPRESSION: 1.  Minimal bibasilar subsegmental atelectasis and small bilateral pleural effusions. 2.  Unchanged cardiomegaly. 3.  Postoperative changes, as above. 4.  Atherosclerosis.  Original Report Authenticated By: Florencia Reasons, M.D.    Impression: Superficial OVH L leg/R EVH site dehiscence with minor drainage.  Plan: We'll continue with wet-to-dry saline dressings twice day left lower leg Return in one week for wound check See me in 2 weeks.

## 2012-02-01 DIAGNOSIS — I10 Essential (primary) hypertension: Secondary | ICD-10-CM | POA: Diagnosis not present

## 2012-02-01 DIAGNOSIS — Z48812 Encounter for surgical aftercare following surgery on the circulatory system: Secondary | ICD-10-CM | POA: Diagnosis not present

## 2012-02-01 DIAGNOSIS — Z483 Aftercare following surgery for neoplasm: Secondary | ICD-10-CM | POA: Diagnosis not present

## 2012-02-01 DIAGNOSIS — R269 Unspecified abnormalities of gait and mobility: Secondary | ICD-10-CM | POA: Diagnosis not present

## 2012-02-01 DIAGNOSIS — I2510 Atherosclerotic heart disease of native coronary artery without angina pectoris: Secondary | ICD-10-CM | POA: Diagnosis not present

## 2012-02-02 DIAGNOSIS — R269 Unspecified abnormalities of gait and mobility: Secondary | ICD-10-CM | POA: Diagnosis not present

## 2012-02-02 DIAGNOSIS — I10 Essential (primary) hypertension: Secondary | ICD-10-CM | POA: Diagnosis not present

## 2012-02-02 DIAGNOSIS — I2510 Atherosclerotic heart disease of native coronary artery without angina pectoris: Secondary | ICD-10-CM | POA: Diagnosis not present

## 2012-02-02 DIAGNOSIS — Z483 Aftercare following surgery for neoplasm: Secondary | ICD-10-CM | POA: Diagnosis not present

## 2012-02-02 DIAGNOSIS — Z48812 Encounter for surgical aftercare following surgery on the circulatory system: Secondary | ICD-10-CM | POA: Diagnosis not present

## 2012-02-03 DIAGNOSIS — R269 Unspecified abnormalities of gait and mobility: Secondary | ICD-10-CM | POA: Diagnosis not present

## 2012-02-03 DIAGNOSIS — I2510 Atherosclerotic heart disease of native coronary artery without angina pectoris: Secondary | ICD-10-CM | POA: Diagnosis not present

## 2012-02-03 DIAGNOSIS — I10 Essential (primary) hypertension: Secondary | ICD-10-CM | POA: Diagnosis not present

## 2012-02-03 DIAGNOSIS — Z48812 Encounter for surgical aftercare following surgery on the circulatory system: Secondary | ICD-10-CM | POA: Diagnosis not present

## 2012-02-03 DIAGNOSIS — Z483 Aftercare following surgery for neoplasm: Secondary | ICD-10-CM | POA: Diagnosis not present

## 2012-02-04 ENCOUNTER — Ambulatory Visit (INDEPENDENT_AMBULATORY_CARE_PROVIDER_SITE_OTHER): Payer: Self-pay | Admitting: Physician Assistant

## 2012-02-04 VITALS — BP 110/64 | HR 66 | Resp 18 | Ht 63.0 in | Wt 142.0 lb

## 2012-02-04 DIAGNOSIS — Z483 Aftercare following surgery for neoplasm: Secondary | ICD-10-CM | POA: Diagnosis not present

## 2012-02-04 DIAGNOSIS — Z48812 Encounter for surgical aftercare following surgery on the circulatory system: Secondary | ICD-10-CM | POA: Diagnosis not present

## 2012-02-04 DIAGNOSIS — T8131XA Disruption of external operation (surgical) wound, not elsewhere classified, initial encounter: Secondary | ICD-10-CM

## 2012-02-04 DIAGNOSIS — I10 Essential (primary) hypertension: Secondary | ICD-10-CM | POA: Diagnosis not present

## 2012-02-04 DIAGNOSIS — I2510 Atherosclerotic heart disease of native coronary artery without angina pectoris: Secondary | ICD-10-CM | POA: Diagnosis not present

## 2012-02-04 DIAGNOSIS — R269 Unspecified abnormalities of gait and mobility: Secondary | ICD-10-CM | POA: Diagnosis not present

## 2012-02-04 DIAGNOSIS — T8130XA Disruption of wound, unspecified, initial encounter: Secondary | ICD-10-CM

## 2012-02-04 NOTE — Progress Notes (Signed)
  HPI:  Patient returns for routine postoperative follow-up having undergone AVR and CABG. Since hospital discharge the patient reports she developed dehiscence,swelling, redness, of the left lower extremity wound.   Current Outpatient Prescriptions  Medication Sig Dispense Refill  . amiodarone (PACERONE) 200 MG tablet Take 200 mg by mouth daily.      Marland Kitchen aspirin EC 81 MG tablet Take 81 mg by mouth daily.        Marland Kitchen atorvastatin (LIPITOR) 20 MG tablet Take 20 mg by mouth at bedtime.        . Calcium Carbonate-Vitamin D (CALCIUM 600 + D PO) Take 1 tablet by mouth 2 (two) times daily.        . cholecalciferol (VITAMIN D) 400 UNITS TABS Take 400 Units by mouth daily.        . folic acid (FOLVITE) 1 MG tablet Take 1 tablet (1 mg total) by mouth daily.      . furosemide (LASIX) 40 MG tablet Take 1 tablet (40 mg total) by mouth daily.  30 tablet  0  . gabapentin (NEURONTIN) 300 MG capsule Take 300 mg by mouth 3 (three) times daily.        Marland Kitchen levothyroxine (SYNTHROID, LEVOTHROID) 88 MCG tablet Take 1 tablet (88 mcg total) by mouth every morning.  30 tablet  0  . Lutein 20 MG CAPS Take 20 mg by mouth daily.        . metoprolol (LOPRESSOR) 50 MG tablet Take 0.5 tablets (25 mg total) by mouth 2 (two) times daily.      . Multiple Vitamin (MULITIVITAMIN WITH MINERALS) TABS Take 1 tablet by mouth daily.        . Multiple Vitamins-Minerals (OCUVITE PRESERVISION PO) Take 1 tablet by mouth 2 (two) times daily.        . potassium chloride SA (K-DUR,KLOR-CON) 20 MEQ tablet Take 1 tablet (20 mEq total) by mouth daily.  30 tablet  0  . vitamin C (ASCORBIC ACID) 500 MG tablet Take 500 mg by mouth daily.        Marland Kitchen warfarin (COUMADIN) 5 MG tablet Take 5 mg by mouth daily.      Vital Signs: BP 110/64,temp 97.7, HR 66, RR 18, O2 Sat 96% on room air.  Physical Exam: Left lower extremity wound: There is granulation present; no purulence noted. The left foot is swollen. There is surrounding erythema of the open distal  left lower extremity wound and foot. Motor and sensory are intact. Multiple eschars were removed from the proximal left  lower extremity wound.  Impression and Plan: Patient was instructed to have home health continue with damp to dry twice a day dressing changes of the left lower extremity. I called in Keflex 500 mg one tablet by mouth 4 times daily in (a #40 with no refills) help treat the cellulitis. She was instructed if  she develops any fever, chills, increasing erythema tenderness or proceed to contact the office immediately. Shealready has an  appointment to see the physician's assistant in 1 week and then we'll see Dr. Tyrone Sage the following week.

## 2012-02-05 DIAGNOSIS — Z483 Aftercare following surgery for neoplasm: Secondary | ICD-10-CM | POA: Diagnosis not present

## 2012-02-05 DIAGNOSIS — I10 Essential (primary) hypertension: Secondary | ICD-10-CM | POA: Diagnosis not present

## 2012-02-05 DIAGNOSIS — R269 Unspecified abnormalities of gait and mobility: Secondary | ICD-10-CM | POA: Diagnosis not present

## 2012-02-05 DIAGNOSIS — I2510 Atherosclerotic heart disease of native coronary artery without angina pectoris: Secondary | ICD-10-CM | POA: Diagnosis not present

## 2012-02-05 DIAGNOSIS — Z48812 Encounter for surgical aftercare following surgery on the circulatory system: Secondary | ICD-10-CM | POA: Diagnosis not present

## 2012-02-06 DIAGNOSIS — Z483 Aftercare following surgery for neoplasm: Secondary | ICD-10-CM | POA: Diagnosis not present

## 2012-02-06 DIAGNOSIS — Z48812 Encounter for surgical aftercare following surgery on the circulatory system: Secondary | ICD-10-CM | POA: Diagnosis not present

## 2012-02-06 DIAGNOSIS — I2510 Atherosclerotic heart disease of native coronary artery without angina pectoris: Secondary | ICD-10-CM | POA: Diagnosis not present

## 2012-02-06 DIAGNOSIS — I10 Essential (primary) hypertension: Secondary | ICD-10-CM | POA: Diagnosis not present

## 2012-02-06 DIAGNOSIS — R269 Unspecified abnormalities of gait and mobility: Secondary | ICD-10-CM | POA: Diagnosis not present

## 2012-02-07 ENCOUNTER — Other Ambulatory Visit: Payer: Self-pay | Admitting: Physician Assistant

## 2012-02-07 ENCOUNTER — Ambulatory Visit: Payer: Medicare Other

## 2012-02-07 DIAGNOSIS — I10 Essential (primary) hypertension: Secondary | ICD-10-CM | POA: Diagnosis not present

## 2012-02-07 DIAGNOSIS — I2510 Atherosclerotic heart disease of native coronary artery without angina pectoris: Secondary | ICD-10-CM | POA: Diagnosis not present

## 2012-02-07 DIAGNOSIS — Z483 Aftercare following surgery for neoplasm: Secondary | ICD-10-CM | POA: Diagnosis not present

## 2012-02-07 DIAGNOSIS — R269 Unspecified abnormalities of gait and mobility: Secondary | ICD-10-CM | POA: Diagnosis not present

## 2012-02-07 DIAGNOSIS — Z48812 Encounter for surgical aftercare following surgery on the circulatory system: Secondary | ICD-10-CM | POA: Diagnosis not present

## 2012-02-08 ENCOUNTER — Other Ambulatory Visit: Payer: Self-pay | Admitting: Physician Assistant

## 2012-02-08 DIAGNOSIS — I2510 Atherosclerotic heart disease of native coronary artery without angina pectoris: Secondary | ICD-10-CM | POA: Diagnosis not present

## 2012-02-08 DIAGNOSIS — R269 Unspecified abnormalities of gait and mobility: Secondary | ICD-10-CM | POA: Diagnosis not present

## 2012-02-08 DIAGNOSIS — Z48812 Encounter for surgical aftercare following surgery on the circulatory system: Secondary | ICD-10-CM | POA: Diagnosis not present

## 2012-02-08 DIAGNOSIS — I10 Essential (primary) hypertension: Secondary | ICD-10-CM | POA: Diagnosis not present

## 2012-02-08 DIAGNOSIS — Z483 Aftercare following surgery for neoplasm: Secondary | ICD-10-CM | POA: Diagnosis not present

## 2012-02-09 DIAGNOSIS — Z48812 Encounter for surgical aftercare following surgery on the circulatory system: Secondary | ICD-10-CM | POA: Diagnosis not present

## 2012-02-09 DIAGNOSIS — I10 Essential (primary) hypertension: Secondary | ICD-10-CM | POA: Diagnosis not present

## 2012-02-09 DIAGNOSIS — R269 Unspecified abnormalities of gait and mobility: Secondary | ICD-10-CM | POA: Diagnosis not present

## 2012-02-09 DIAGNOSIS — I2510 Atherosclerotic heart disease of native coronary artery without angina pectoris: Secondary | ICD-10-CM | POA: Diagnosis not present

## 2012-02-09 DIAGNOSIS — Z483 Aftercare following surgery for neoplasm: Secondary | ICD-10-CM | POA: Diagnosis not present

## 2012-02-10 DIAGNOSIS — R269 Unspecified abnormalities of gait and mobility: Secondary | ICD-10-CM | POA: Diagnosis not present

## 2012-02-10 DIAGNOSIS — I10 Essential (primary) hypertension: Secondary | ICD-10-CM | POA: Diagnosis not present

## 2012-02-10 DIAGNOSIS — Z48812 Encounter for surgical aftercare following surgery on the circulatory system: Secondary | ICD-10-CM | POA: Diagnosis not present

## 2012-02-10 DIAGNOSIS — Z483 Aftercare following surgery for neoplasm: Secondary | ICD-10-CM | POA: Diagnosis not present

## 2012-02-10 DIAGNOSIS — I2510 Atherosclerotic heart disease of native coronary artery without angina pectoris: Secondary | ICD-10-CM | POA: Diagnosis not present

## 2012-02-10 LAB — AFB CULTURE WITH SMEAR (NOT AT ARMC): Acid Fast Smear: NONE SEEN

## 2012-02-11 ENCOUNTER — Ambulatory Visit (INDEPENDENT_AMBULATORY_CARE_PROVIDER_SITE_OTHER): Payer: Self-pay

## 2012-02-11 DIAGNOSIS — Z48812 Encounter for surgical aftercare following surgery on the circulatory system: Secondary | ICD-10-CM | POA: Diagnosis not present

## 2012-02-11 DIAGNOSIS — I359 Nonrheumatic aortic valve disorder, unspecified: Secondary | ICD-10-CM

## 2012-02-11 DIAGNOSIS — I2510 Atherosclerotic heart disease of native coronary artery without angina pectoris: Secondary | ICD-10-CM | POA: Diagnosis not present

## 2012-02-11 DIAGNOSIS — Z483 Aftercare following surgery for neoplasm: Secondary | ICD-10-CM | POA: Diagnosis not present

## 2012-02-11 DIAGNOSIS — T8131XA Disruption of external operation (surgical) wound, not elsewhere classified, initial encounter: Secondary | ICD-10-CM

## 2012-02-11 DIAGNOSIS — I10 Essential (primary) hypertension: Secondary | ICD-10-CM | POA: Diagnosis not present

## 2012-02-11 DIAGNOSIS — R269 Unspecified abnormalities of gait and mobility: Secondary | ICD-10-CM | POA: Diagnosis not present

## 2012-02-11 DIAGNOSIS — G8918 Other acute postprocedural pain: Secondary | ICD-10-CM

## 2012-02-11 MED ORDER — HYDROCODONE-ACETAMINOPHEN 5-500 MG PO TABS: 1.0000 | ORAL_TABLET | Freq: Four times a day (QID) | ORAL | Status: AC | PRN

## 2012-02-11 NOTE — Patient Instructions (Signed)
Appointment with Dr Tyrone Sage on 02/21/2012 @ 1530

## 2012-02-11 NOTE — Progress Notes (Unsigned)
Changed pt's left lower leg dressing. There was small amount of fatty necrosis tissue present. Jolene RN removed tissue. A damp to dry dressing was applied and wrapped loosely with gauze. There was less swelling in her left lower leg and foot, mild erythema around wound, no drainage present, no fevers or chills reported. Pt is currently on Keflex 500 mg QID for 10 days. Home Health nurse is changing dressing every day. Pt did request pain medication to be called to pharmacy.

## 2012-02-12 DIAGNOSIS — I4891 Unspecified atrial fibrillation: Secondary | ICD-10-CM | POA: Diagnosis not present

## 2012-02-12 DIAGNOSIS — Z954 Presence of other heart-valve replacement: Secondary | ICD-10-CM | POA: Diagnosis not present

## 2012-02-12 DIAGNOSIS — Z48812 Encounter for surgical aftercare following surgery on the circulatory system: Secondary | ICD-10-CM | POA: Diagnosis not present

## 2012-02-12 DIAGNOSIS — Z7901 Long term (current) use of anticoagulants: Secondary | ICD-10-CM | POA: Diagnosis not present

## 2012-02-12 DIAGNOSIS — Z483 Aftercare following surgery for neoplasm: Secondary | ICD-10-CM | POA: Diagnosis not present

## 2012-02-12 DIAGNOSIS — E785 Hyperlipidemia, unspecified: Secondary | ICD-10-CM | POA: Diagnosis not present

## 2012-02-12 DIAGNOSIS — I359 Nonrheumatic aortic valve disorder, unspecified: Secondary | ICD-10-CM | POA: Diagnosis not present

## 2012-02-12 DIAGNOSIS — E039 Hypothyroidism, unspecified: Secondary | ICD-10-CM | POA: Diagnosis not present

## 2012-02-12 DIAGNOSIS — I10 Essential (primary) hypertension: Secondary | ICD-10-CM | POA: Diagnosis not present

## 2012-02-12 DIAGNOSIS — I2510 Atherosclerotic heart disease of native coronary artery without angina pectoris: Secondary | ICD-10-CM | POA: Diagnosis not present

## 2012-02-12 DIAGNOSIS — R269 Unspecified abnormalities of gait and mobility: Secondary | ICD-10-CM | POA: Diagnosis not present

## 2012-02-12 NOTE — Progress Notes (Signed)
closed

## 2012-02-13 DIAGNOSIS — I2510 Atherosclerotic heart disease of native coronary artery without angina pectoris: Secondary | ICD-10-CM | POA: Diagnosis not present

## 2012-02-13 DIAGNOSIS — R269 Unspecified abnormalities of gait and mobility: Secondary | ICD-10-CM | POA: Diagnosis not present

## 2012-02-13 DIAGNOSIS — I10 Essential (primary) hypertension: Secondary | ICD-10-CM | POA: Diagnosis not present

## 2012-02-13 DIAGNOSIS — Z483 Aftercare following surgery for neoplasm: Secondary | ICD-10-CM | POA: Diagnosis not present

## 2012-02-13 DIAGNOSIS — Z48812 Encounter for surgical aftercare following surgery on the circulatory system: Secondary | ICD-10-CM | POA: Diagnosis not present

## 2012-02-14 DIAGNOSIS — I2510 Atherosclerotic heart disease of native coronary artery without angina pectoris: Secondary | ICD-10-CM | POA: Diagnosis not present

## 2012-02-14 DIAGNOSIS — Z48812 Encounter for surgical aftercare following surgery on the circulatory system: Secondary | ICD-10-CM | POA: Diagnosis not present

## 2012-02-14 DIAGNOSIS — I10 Essential (primary) hypertension: Secondary | ICD-10-CM | POA: Diagnosis not present

## 2012-02-14 DIAGNOSIS — R269 Unspecified abnormalities of gait and mobility: Secondary | ICD-10-CM | POA: Diagnosis not present

## 2012-02-14 DIAGNOSIS — Z483 Aftercare following surgery for neoplasm: Secondary | ICD-10-CM | POA: Diagnosis not present

## 2012-02-15 DIAGNOSIS — Z48812 Encounter for surgical aftercare following surgery on the circulatory system: Secondary | ICD-10-CM | POA: Diagnosis not present

## 2012-02-15 DIAGNOSIS — I10 Essential (primary) hypertension: Secondary | ICD-10-CM | POA: Diagnosis not present

## 2012-02-15 DIAGNOSIS — R269 Unspecified abnormalities of gait and mobility: Secondary | ICD-10-CM | POA: Diagnosis not present

## 2012-02-15 DIAGNOSIS — Z483 Aftercare following surgery for neoplasm: Secondary | ICD-10-CM | POA: Diagnosis not present

## 2012-02-15 DIAGNOSIS — I2510 Atherosclerotic heart disease of native coronary artery without angina pectoris: Secondary | ICD-10-CM | POA: Diagnosis not present

## 2012-02-16 DIAGNOSIS — I10 Essential (primary) hypertension: Secondary | ICD-10-CM | POA: Diagnosis not present

## 2012-02-16 DIAGNOSIS — R269 Unspecified abnormalities of gait and mobility: Secondary | ICD-10-CM | POA: Diagnosis not present

## 2012-02-16 DIAGNOSIS — Z48812 Encounter for surgical aftercare following surgery on the circulatory system: Secondary | ICD-10-CM | POA: Diagnosis not present

## 2012-02-16 DIAGNOSIS — Z483 Aftercare following surgery for neoplasm: Secondary | ICD-10-CM | POA: Diagnosis not present

## 2012-02-16 DIAGNOSIS — I2510 Atherosclerotic heart disease of native coronary artery without angina pectoris: Secondary | ICD-10-CM | POA: Diagnosis not present

## 2012-02-17 DIAGNOSIS — I2510 Atherosclerotic heart disease of native coronary artery without angina pectoris: Secondary | ICD-10-CM | POA: Diagnosis not present

## 2012-02-17 DIAGNOSIS — Z48812 Encounter for surgical aftercare following surgery on the circulatory system: Secondary | ICD-10-CM | POA: Diagnosis not present

## 2012-02-17 DIAGNOSIS — R269 Unspecified abnormalities of gait and mobility: Secondary | ICD-10-CM | POA: Diagnosis not present

## 2012-02-17 DIAGNOSIS — Z483 Aftercare following surgery for neoplasm: Secondary | ICD-10-CM | POA: Diagnosis not present

## 2012-02-17 DIAGNOSIS — I10 Essential (primary) hypertension: Secondary | ICD-10-CM | POA: Diagnosis not present

## 2012-02-18 DIAGNOSIS — I10 Essential (primary) hypertension: Secondary | ICD-10-CM | POA: Diagnosis not present

## 2012-02-18 DIAGNOSIS — I2510 Atherosclerotic heart disease of native coronary artery without angina pectoris: Secondary | ICD-10-CM | POA: Diagnosis not present

## 2012-02-18 DIAGNOSIS — R269 Unspecified abnormalities of gait and mobility: Secondary | ICD-10-CM | POA: Diagnosis not present

## 2012-02-18 DIAGNOSIS — B351 Tinea unguium: Secondary | ICD-10-CM | POA: Diagnosis not present

## 2012-02-18 DIAGNOSIS — Z48812 Encounter for surgical aftercare following surgery on the circulatory system: Secondary | ICD-10-CM | POA: Diagnosis not present

## 2012-02-18 DIAGNOSIS — M79609 Pain in unspecified limb: Secondary | ICD-10-CM | POA: Diagnosis not present

## 2012-02-18 DIAGNOSIS — Z483 Aftercare following surgery for neoplasm: Secondary | ICD-10-CM | POA: Diagnosis not present

## 2012-02-19 ENCOUNTER — Other Ambulatory Visit: Payer: Self-pay | Admitting: Cardiothoracic Surgery

## 2012-02-19 DIAGNOSIS — Z48812 Encounter for surgical aftercare following surgery on the circulatory system: Secondary | ICD-10-CM | POA: Diagnosis not present

## 2012-02-19 DIAGNOSIS — Z483 Aftercare following surgery for neoplasm: Secondary | ICD-10-CM | POA: Diagnosis not present

## 2012-02-19 DIAGNOSIS — I2510 Atherosclerotic heart disease of native coronary artery without angina pectoris: Secondary | ICD-10-CM | POA: Diagnosis not present

## 2012-02-19 DIAGNOSIS — I10 Essential (primary) hypertension: Secondary | ICD-10-CM | POA: Diagnosis not present

## 2012-02-19 DIAGNOSIS — R269 Unspecified abnormalities of gait and mobility: Secondary | ICD-10-CM | POA: Diagnosis not present

## 2012-02-20 DIAGNOSIS — I2510 Atherosclerotic heart disease of native coronary artery without angina pectoris: Secondary | ICD-10-CM | POA: Diagnosis not present

## 2012-02-20 DIAGNOSIS — R269 Unspecified abnormalities of gait and mobility: Secondary | ICD-10-CM | POA: Diagnosis not present

## 2012-02-20 DIAGNOSIS — Z483 Aftercare following surgery for neoplasm: Secondary | ICD-10-CM | POA: Diagnosis not present

## 2012-02-20 DIAGNOSIS — Z48812 Encounter for surgical aftercare following surgery on the circulatory system: Secondary | ICD-10-CM | POA: Diagnosis not present

## 2012-02-20 DIAGNOSIS — I10 Essential (primary) hypertension: Secondary | ICD-10-CM | POA: Diagnosis not present

## 2012-02-21 ENCOUNTER — Encounter: Payer: Self-pay | Admitting: Cardiothoracic Surgery

## 2012-02-21 ENCOUNTER — Ambulatory Visit (INDEPENDENT_AMBULATORY_CARE_PROVIDER_SITE_OTHER): Payer: Self-pay | Admitting: Cardiothoracic Surgery

## 2012-02-21 ENCOUNTER — Ambulatory Visit
Admission: RE | Admit: 2012-02-21 | Discharge: 2012-02-21 | Disposition: A | Discharge status: Home / Self Care | Payer: Medicare Other | Source: Ambulatory Visit | Attending: Cardiothoracic Surgery | Admitting: Cardiothoracic Surgery

## 2012-02-21 VITALS — BP 141/63 | HR 69 | Resp 16 | Ht 63.0 in | Wt 142.0 lb

## 2012-02-21 DIAGNOSIS — Z48812 Encounter for surgical aftercare following surgery on the circulatory system: Secondary | ICD-10-CM | POA: Diagnosis not present

## 2012-02-21 DIAGNOSIS — L02419 Cutaneous abscess of limb, unspecified: Secondary | ICD-10-CM

## 2012-02-21 DIAGNOSIS — I517 Cardiomegaly: Secondary | ICD-10-CM | POA: Diagnosis not present

## 2012-02-21 DIAGNOSIS — I2510 Atherosclerotic heart disease of native coronary artery without angina pectoris: Secondary | ICD-10-CM | POA: Diagnosis not present

## 2012-02-21 DIAGNOSIS — Z483 Aftercare following surgery for neoplasm: Secondary | ICD-10-CM | POA: Diagnosis not present

## 2012-02-21 DIAGNOSIS — T8131XA Disruption of external operation (surgical) wound, not elsewhere classified, initial encounter: Secondary | ICD-10-CM

## 2012-02-21 DIAGNOSIS — R269 Unspecified abnormalities of gait and mobility: Secondary | ICD-10-CM | POA: Diagnosis not present

## 2012-02-21 DIAGNOSIS — Z954 Presence of other heart-valve replacement: Secondary | ICD-10-CM

## 2012-02-21 DIAGNOSIS — I10 Essential (primary) hypertension: Secondary | ICD-10-CM | POA: Diagnosis not present

## 2012-02-21 DIAGNOSIS — J984 Other disorders of lung: Secondary | ICD-10-CM | POA: Diagnosis not present

## 2012-02-21 DIAGNOSIS — Z951 Presence of aortocoronary bypass graft: Secondary | ICD-10-CM

## 2012-02-21 DIAGNOSIS — Z952 Presence of prosthetic heart valve: Secondary | ICD-10-CM

## 2012-02-21 NOTE — Progress Notes (Signed)
[address]            [name] [address]          [phone]    HPI: Patient returns for routine postoperative follow-up having undergone Cabg.She was readmitted for volume overload and has responded well to diuresis. Additionally, she has superficial dehiscence/ infection from LLE OVH incision. She was on a course of Keflex for cellulitis in the left leg 1 which is now completed. She continues with local wound care to the lower portion of the leg wound which is being treated with wet-to-dry dressing. Other than the leg she is markedly improved gaining strength and has decreased edema in the lower extremities.  Current Outpatient Prescriptions  Medication Sig Dispense Refill  . amiodarone (PACERONE) 200 MG tablet Take 200 mg by mouth daily.      Marland Kitchen aspirin EC 81 MG tablet Take 81 mg by mouth daily.        Marland Kitchen atorvastatin (LIPITOR) 20 MG tablet Take 20 mg by mouth at bedtime.        . Calcium Carbonate-Vitamin D (CALCIUM 600 + D PO) Take 1 tablet by mouth 2 (two) times daily.        . cholecalciferol (VITAMIN D) 400 UNITS TABS Take 400 Units by mouth daily.        . folic acid (FOLVITE) 1 MG tablet Take 1 tablet (1 mg total) by mouth daily.      . furosemide (LASIX) 40 MG tablet Take 1 tablet (40 mg total) by mouth daily.  30 tablet  0  . gabapentin (NEURONTIN) 300 MG capsule Take 300 mg by mouth 3 (three) times daily.        Marland Kitchen HYDROcodone-acetaminophen (VICODIN) 5-500 MG per tablet Take 1 tablet by mouth every 6 (six) hours as needed for pain.  40 tablet  0  . KLOR-CON M20 20 MEQ tablet TAKE 1 TABLET BY MOUTH ONCE DAILY  30 tablet  0  . levothyroxine (SYNTHROID, LEVOTHROID) 88 MCG tablet Take 1 tablet (88 mcg total) by mouth every morning.  30 tablet  0  . Lutein 20 MG CAPS Take 20 mg by mouth daily.        . metoprolol (LOPRESSOR) 50 MG tablet Take 0.5 tablets (25 mg total) by mouth 2 (two) times daily.      . Multiple Vitamin  (MULITIVITAMIN WITH MINERALS) TABS Take 1 tablet by mouth daily.        . Multiple Vitamins-Minerals (OCUVITE PRESERVISION PO) Take 1 tablet by mouth 2 (two) times daily.        . vitamin C (ASCORBIC ACID) 500 MG tablet Take 500 mg by mouth daily.        Marland Kitchen warfarin (COUMADIN) 5 MG tablet Take 5 mg by mouth daily.        Physical Exam: Lower extremity- the left lower extremity below the knee continues with local wet-to-dry wound care the bottom half of the incision is opened granulation tissue is appearing there's not appear to be any active infection. Scant slighlty purulent drainage, minimal erethema, lower extremity edema in both legs is much improved. The right evh site at knee has superficial dehiscence with scant drainage as well. There is increasing granulation tissue without evidence of infection . Chest incision: Healing well  without evidence of infection.    Diagnostic Tests: Dg Chest 2 View  02/21/2012  *RADIOLOGY REPORT*  Clinical Data: Cardiac surgery in January 2013, follow-up  CHEST - 2 VIEW  Comparison: Chest x-ray of 01/31/2012  Findings: Aeration has improved, and small effusions have resolved. Cardiomegaly is stable.  Scarring in the periphery of the left upper lung field is stable.  Median sternotomy sutures are noted and there is an aortic valve replacement present.  No bony abnormality is seen.  IMPRESSION: Improved aeration.  Resolution of small effusions.  Stable cardiomegaly.  Original Report Authenticated By: Juline Patch, M.D.    Impression: Superficial OVH L leg/R EVH site dehiscence with continued local wound care  Plan: We'll continue with wet-to-dry hydrogel dressings daily left lower leg Return in 2 weeks

## 2012-02-22 DIAGNOSIS — I2510 Atherosclerotic heart disease of native coronary artery without angina pectoris: Secondary | ICD-10-CM | POA: Diagnosis not present

## 2012-02-22 DIAGNOSIS — R269 Unspecified abnormalities of gait and mobility: Secondary | ICD-10-CM | POA: Diagnosis not present

## 2012-02-22 DIAGNOSIS — I10 Essential (primary) hypertension: Secondary | ICD-10-CM | POA: Diagnosis not present

## 2012-02-22 DIAGNOSIS — Z48812 Encounter for surgical aftercare following surgery on the circulatory system: Secondary | ICD-10-CM | POA: Diagnosis not present

## 2012-02-22 DIAGNOSIS — Z483 Aftercare following surgery for neoplasm: Secondary | ICD-10-CM | POA: Diagnosis not present

## 2012-02-25 DIAGNOSIS — I2510 Atherosclerotic heart disease of native coronary artery without angina pectoris: Secondary | ICD-10-CM | POA: Diagnosis not present

## 2012-02-25 DIAGNOSIS — R269 Unspecified abnormalities of gait and mobility: Secondary | ICD-10-CM | POA: Diagnosis not present

## 2012-02-25 DIAGNOSIS — I10 Essential (primary) hypertension: Secondary | ICD-10-CM | POA: Diagnosis not present

## 2012-02-25 DIAGNOSIS — Z483 Aftercare following surgery for neoplasm: Secondary | ICD-10-CM | POA: Diagnosis not present

## 2012-02-25 DIAGNOSIS — Z48812 Encounter for surgical aftercare following surgery on the circulatory system: Secondary | ICD-10-CM | POA: Diagnosis not present

## 2012-02-26 ENCOUNTER — Ambulatory Visit
Admission: RE | Admit: 2012-02-26 | Discharge: 2012-02-26 | Disposition: A | Discharge status: Home / Self Care | Payer: Medicare Other | Source: Ambulatory Visit | Attending: Family Medicine | Admitting: Family Medicine

## 2012-02-26 DIAGNOSIS — N6489 Other specified disorders of breast: Secondary | ICD-10-CM | POA: Diagnosis not present

## 2012-02-27 DIAGNOSIS — Z483 Aftercare following surgery for neoplasm: Secondary | ICD-10-CM | POA: Diagnosis not present

## 2012-02-27 DIAGNOSIS — R269 Unspecified abnormalities of gait and mobility: Secondary | ICD-10-CM | POA: Diagnosis not present

## 2012-02-27 DIAGNOSIS — Z48812 Encounter for surgical aftercare following surgery on the circulatory system: Secondary | ICD-10-CM | POA: Diagnosis not present

## 2012-02-27 DIAGNOSIS — I10 Essential (primary) hypertension: Secondary | ICD-10-CM | POA: Diagnosis not present

## 2012-02-27 DIAGNOSIS — I2510 Atherosclerotic heart disease of native coronary artery without angina pectoris: Secondary | ICD-10-CM | POA: Diagnosis not present

## 2012-02-29 DIAGNOSIS — I2510 Atherosclerotic heart disease of native coronary artery without angina pectoris: Secondary | ICD-10-CM | POA: Diagnosis not present

## 2012-02-29 DIAGNOSIS — R269 Unspecified abnormalities of gait and mobility: Secondary | ICD-10-CM | POA: Diagnosis not present

## 2012-02-29 DIAGNOSIS — Z48812 Encounter for surgical aftercare following surgery on the circulatory system: Secondary | ICD-10-CM | POA: Diagnosis not present

## 2012-02-29 DIAGNOSIS — I10 Essential (primary) hypertension: Secondary | ICD-10-CM | POA: Diagnosis not present

## 2012-02-29 DIAGNOSIS — Z483 Aftercare following surgery for neoplasm: Secondary | ICD-10-CM | POA: Diagnosis not present

## 2012-03-03 DIAGNOSIS — Z483 Aftercare following surgery for neoplasm: Secondary | ICD-10-CM | POA: Diagnosis not present

## 2012-03-03 DIAGNOSIS — Z48812 Encounter for surgical aftercare following surgery on the circulatory system: Secondary | ICD-10-CM | POA: Diagnosis not present

## 2012-03-03 DIAGNOSIS — I10 Essential (primary) hypertension: Secondary | ICD-10-CM | POA: Diagnosis not present

## 2012-03-03 DIAGNOSIS — I2510 Atherosclerotic heart disease of native coronary artery without angina pectoris: Secondary | ICD-10-CM | POA: Diagnosis not present

## 2012-03-03 DIAGNOSIS — R269 Unspecified abnormalities of gait and mobility: Secondary | ICD-10-CM | POA: Diagnosis not present

## 2012-03-04 DIAGNOSIS — I4891 Unspecified atrial fibrillation: Secondary | ICD-10-CM | POA: Diagnosis not present

## 2012-03-04 DIAGNOSIS — Z7901 Long term (current) use of anticoagulants: Secondary | ICD-10-CM | POA: Diagnosis not present

## 2012-03-04 DIAGNOSIS — I2510 Atherosclerotic heart disease of native coronary artery without angina pectoris: Secondary | ICD-10-CM | POA: Diagnosis not present

## 2012-03-04 DIAGNOSIS — Z954 Presence of other heart-valve replacement: Secondary | ICD-10-CM | POA: Diagnosis not present

## 2012-03-04 DIAGNOSIS — I10 Essential (primary) hypertension: Secondary | ICD-10-CM | POA: Diagnosis not present

## 2012-03-04 DIAGNOSIS — Z951 Presence of aortocoronary bypass graft: Secondary | ICD-10-CM | POA: Diagnosis not present

## 2012-03-04 DIAGNOSIS — E785 Hyperlipidemia, unspecified: Secondary | ICD-10-CM | POA: Diagnosis not present

## 2012-03-04 DIAGNOSIS — I359 Nonrheumatic aortic valve disorder, unspecified: Secondary | ICD-10-CM | POA: Diagnosis not present

## 2012-03-05 ENCOUNTER — Other Ambulatory Visit: Payer: Self-pay | Admitting: Physician Assistant

## 2012-03-05 DIAGNOSIS — I2510 Atherosclerotic heart disease of native coronary artery without angina pectoris: Secondary | ICD-10-CM | POA: Diagnosis not present

## 2012-03-05 DIAGNOSIS — I10 Essential (primary) hypertension: Secondary | ICD-10-CM | POA: Diagnosis not present

## 2012-03-05 DIAGNOSIS — R269 Unspecified abnormalities of gait and mobility: Secondary | ICD-10-CM | POA: Diagnosis not present

## 2012-03-05 DIAGNOSIS — Z48812 Encounter for surgical aftercare following surgery on the circulatory system: Secondary | ICD-10-CM | POA: Diagnosis not present

## 2012-03-05 DIAGNOSIS — Z483 Aftercare following surgery for neoplasm: Secondary | ICD-10-CM | POA: Diagnosis not present

## 2012-03-06 ENCOUNTER — Ambulatory Visit (INDEPENDENT_AMBULATORY_CARE_PROVIDER_SITE_OTHER): Payer: Self-pay | Admitting: Cardiothoracic Surgery

## 2012-03-06 ENCOUNTER — Other Ambulatory Visit: Payer: Self-pay | Admitting: Cardiology

## 2012-03-06 ENCOUNTER — Encounter: Payer: Self-pay | Admitting: Cardiothoracic Surgery

## 2012-03-06 VITALS — BP 130/72 | HR 100 | Resp 20 | Ht 63.0 in | Wt 144.0 lb

## 2012-03-06 DIAGNOSIS — T8131XA Disruption of external operation (surgical) wound, not elsewhere classified, initial encounter: Secondary | ICD-10-CM

## 2012-03-07 DIAGNOSIS — Z48812 Encounter for surgical aftercare following surgery on the circulatory system: Secondary | ICD-10-CM | POA: Diagnosis not present

## 2012-03-07 DIAGNOSIS — Z483 Aftercare following surgery for neoplasm: Secondary | ICD-10-CM | POA: Diagnosis not present

## 2012-03-07 DIAGNOSIS — I2510 Atherosclerotic heart disease of native coronary artery without angina pectoris: Secondary | ICD-10-CM | POA: Diagnosis not present

## 2012-03-07 DIAGNOSIS — I10 Essential (primary) hypertension: Secondary | ICD-10-CM | POA: Diagnosis not present

## 2012-03-07 DIAGNOSIS — R269 Unspecified abnormalities of gait and mobility: Secondary | ICD-10-CM | POA: Diagnosis not present

## 2012-03-08 DIAGNOSIS — I2510 Atherosclerotic heart disease of native coronary artery without angina pectoris: Secondary | ICD-10-CM | POA: Diagnosis not present

## 2012-03-08 DIAGNOSIS — Z5181 Encounter for therapeutic drug level monitoring: Secondary | ICD-10-CM | POA: Diagnosis not present

## 2012-03-08 DIAGNOSIS — T8131XA Disruption of external operation (surgical) wound, not elsewhere classified, initial encounter: Secondary | ICD-10-CM | POA: Diagnosis not present

## 2012-03-08 DIAGNOSIS — Z7901 Long term (current) use of anticoagulants: Secondary | ICD-10-CM | POA: Diagnosis not present

## 2012-03-08 DIAGNOSIS — I10 Essential (primary) hypertension: Secondary | ICD-10-CM | POA: Diagnosis not present

## 2012-03-09 ENCOUNTER — Encounter: Payer: Self-pay | Admitting: Cardiothoracic Surgery

## 2012-03-09 NOTE — Progress Notes (Signed)
                                                     [address]            [place] [address]          [phone]     HPI: Patient returns for routine postoperative follow-up having undergone Cabg. , she has superficial dehiscence/ infection from LLE OVH incision. . She continues with local wound care to the lower portion of the leg wound which is being treated with wet-to-dry dressing. Other than the leg she is markedly improved gaining strength and has decreased edema in the lower extremities.  Current Outpatient Prescriptions  Medication Sig Dispense Refill  . aspirin EC 81 MG tablet Take 81 mg by mouth daily.        Marland Kitchen atorvastatin (LIPITOR) 20 MG tablet Take 20 mg by mouth at bedtime.        . Calcium Carbonate-Vitamin D (CALCIUM 600 + D PO) Take 1 tablet by mouth 2 (two) times daily.        . cholecalciferol (VITAMIN D) 400 UNITS TABS Take 400 Units by mouth daily.        . furosemide (LASIX) 40 MG tablet Take 40 mg by mouth daily.      Marland Kitchen gabapentin (NEURONTIN) 300 MG capsule Take 300 mg by mouth 3 (three) times daily.        Marland Kitchen KLOR-CON M20 20 MEQ tablet TAKE 1 TABLET BY MOUTH ONCE DAILY  30 tablet  0  . levothyroxine (SYNTHROID, LEVOTHROID) 88 MCG tablet Take 1 tablet (88 mcg total) by mouth every morning.  30 tablet  0  . Lutein 20 MG CAPS Take 20 mg by mouth daily.        . metoprolol (LOPRESSOR) 50 MG tablet Take 0.5 tablets (25 mg total) by mouth 2 (two) times daily.      . Multiple Vitamin (MULITIVITAMIN WITH MINERALS) TABS Take 1 tablet by mouth daily.        . Multiple Vitamins-Minerals (OCUVITE PRESERVISION PO) Take 1 tablet by mouth 2 (two) times daily.        . vitamin C (ASCORBIC ACID) 500 MG tablet Take 500 mg by mouth daily.        Marland Kitchen warfarin (COUMADIN) 5 MG tablet Take 5 mg by mouth daily.        Physical Exam: Lower extremity- the left lower extremity below the knee continues with local wet-to-dry wound care the bottom half of the incision is  opened granulation tissue is MUCH IMPROVED.  Llower extremity edema in both legs is much improved. The right evh site at knee has superficial dehiscence with scant drainage as well. There is increasing granulation tissue without evidence of infection . Chest incision: Healing well without evidence of infection.    Diagnostic Tests: No results found.  Impression: Superficial OVH L leg/R EVH site dehiscence with continued local wound care  Plan: We'll continue with wet-to-dry hydrogel dressings daily left lower leg Return in 3 weeks

## 2012-03-10 DIAGNOSIS — Z5181 Encounter for therapeutic drug level monitoring: Secondary | ICD-10-CM | POA: Diagnosis not present

## 2012-03-10 DIAGNOSIS — I10 Essential (primary) hypertension: Secondary | ICD-10-CM | POA: Diagnosis not present

## 2012-03-10 DIAGNOSIS — I2510 Atherosclerotic heart disease of native coronary artery without angina pectoris: Secondary | ICD-10-CM | POA: Diagnosis not present

## 2012-03-10 DIAGNOSIS — Z7901 Long term (current) use of anticoagulants: Secondary | ICD-10-CM | POA: Diagnosis not present

## 2012-03-10 DIAGNOSIS — T8131XA Disruption of external operation (surgical) wound, not elsewhere classified, initial encounter: Secondary | ICD-10-CM | POA: Diagnosis not present

## 2012-03-12 DIAGNOSIS — I2510 Atherosclerotic heart disease of native coronary artery without angina pectoris: Secondary | ICD-10-CM | POA: Diagnosis not present

## 2012-03-12 DIAGNOSIS — E039 Hypothyroidism, unspecified: Secondary | ICD-10-CM | POA: Diagnosis not present

## 2012-03-12 DIAGNOSIS — Z5181 Encounter for therapeutic drug level monitoring: Secondary | ICD-10-CM | POA: Diagnosis not present

## 2012-03-12 DIAGNOSIS — Z7901 Long term (current) use of anticoagulants: Secondary | ICD-10-CM | POA: Diagnosis not present

## 2012-03-12 DIAGNOSIS — I10 Essential (primary) hypertension: Secondary | ICD-10-CM | POA: Diagnosis not present

## 2012-03-12 DIAGNOSIS — Z79899 Other long term (current) drug therapy: Secondary | ICD-10-CM | POA: Diagnosis not present

## 2012-03-12 DIAGNOSIS — T8131XA Disruption of external operation (surgical) wound, not elsewhere classified, initial encounter: Secondary | ICD-10-CM | POA: Diagnosis not present

## 2012-03-13 DIAGNOSIS — H2510 Age-related nuclear cataract, unspecified eye: Secondary | ICD-10-CM | POA: Diagnosis not present

## 2012-03-14 DIAGNOSIS — Z5181 Encounter for therapeutic drug level monitoring: Secondary | ICD-10-CM | POA: Diagnosis not present

## 2012-03-14 DIAGNOSIS — I2510 Atherosclerotic heart disease of native coronary artery without angina pectoris: Secondary | ICD-10-CM | POA: Diagnosis not present

## 2012-03-14 DIAGNOSIS — T8131XA Disruption of external operation (surgical) wound, not elsewhere classified, initial encounter: Secondary | ICD-10-CM | POA: Diagnosis not present

## 2012-03-14 DIAGNOSIS — Z7901 Long term (current) use of anticoagulants: Secondary | ICD-10-CM | POA: Diagnosis not present

## 2012-03-14 DIAGNOSIS — I10 Essential (primary) hypertension: Secondary | ICD-10-CM | POA: Diagnosis not present

## 2012-03-17 DIAGNOSIS — T8131XA Disruption of external operation (surgical) wound, not elsewhere classified, initial encounter: Secondary | ICD-10-CM | POA: Diagnosis not present

## 2012-03-17 DIAGNOSIS — I2510 Atherosclerotic heart disease of native coronary artery without angina pectoris: Secondary | ICD-10-CM | POA: Diagnosis not present

## 2012-03-17 DIAGNOSIS — Z5181 Encounter for therapeutic drug level monitoring: Secondary | ICD-10-CM | POA: Diagnosis not present

## 2012-03-17 DIAGNOSIS — I10 Essential (primary) hypertension: Secondary | ICD-10-CM | POA: Diagnosis not present

## 2012-03-17 DIAGNOSIS — Z7901 Long term (current) use of anticoagulants: Secondary | ICD-10-CM | POA: Diagnosis not present

## 2012-03-19 DIAGNOSIS — Z5181 Encounter for therapeutic drug level monitoring: Secondary | ICD-10-CM | POA: Diagnosis not present

## 2012-03-19 DIAGNOSIS — T8131XA Disruption of external operation (surgical) wound, not elsewhere classified, initial encounter: Secondary | ICD-10-CM | POA: Diagnosis not present

## 2012-03-19 DIAGNOSIS — I10 Essential (primary) hypertension: Secondary | ICD-10-CM | POA: Diagnosis not present

## 2012-03-19 DIAGNOSIS — I2510 Atherosclerotic heart disease of native coronary artery without angina pectoris: Secondary | ICD-10-CM | POA: Diagnosis not present

## 2012-03-19 DIAGNOSIS — Z7901 Long term (current) use of anticoagulants: Secondary | ICD-10-CM | POA: Diagnosis not present

## 2012-03-21 DIAGNOSIS — Z5181 Encounter for therapeutic drug level monitoring: Secondary | ICD-10-CM | POA: Diagnosis not present

## 2012-03-21 DIAGNOSIS — I2510 Atherosclerotic heart disease of native coronary artery without angina pectoris: Secondary | ICD-10-CM | POA: Diagnosis not present

## 2012-03-21 DIAGNOSIS — Z7901 Long term (current) use of anticoagulants: Secondary | ICD-10-CM | POA: Diagnosis not present

## 2012-03-21 DIAGNOSIS — I10 Essential (primary) hypertension: Secondary | ICD-10-CM | POA: Diagnosis not present

## 2012-03-21 DIAGNOSIS — T8131XA Disruption of external operation (surgical) wound, not elsewhere classified, initial encounter: Secondary | ICD-10-CM | POA: Diagnosis not present

## 2012-03-24 ENCOUNTER — Other Ambulatory Visit: Payer: Self-pay

## 2012-03-24 DIAGNOSIS — T8131XA Disruption of external operation (surgical) wound, not elsewhere classified, initial encounter: Secondary | ICD-10-CM | POA: Diagnosis not present

## 2012-03-24 DIAGNOSIS — I10 Essential (primary) hypertension: Secondary | ICD-10-CM | POA: Diagnosis not present

## 2012-03-24 DIAGNOSIS — G8918 Other acute postprocedural pain: Secondary | ICD-10-CM

## 2012-03-24 DIAGNOSIS — Z5181 Encounter for therapeutic drug level monitoring: Secondary | ICD-10-CM | POA: Diagnosis not present

## 2012-03-24 DIAGNOSIS — Z7901 Long term (current) use of anticoagulants: Secondary | ICD-10-CM | POA: Diagnosis not present

## 2012-03-24 DIAGNOSIS — I2510 Atherosclerotic heart disease of native coronary artery without angina pectoris: Secondary | ICD-10-CM | POA: Diagnosis not present

## 2012-03-24 MED ORDER — HYDROCODONE-ACETAMINOPHEN 5-500 MG PO TABS: 1.0000 | ORAL_TABLET | Freq: Four times a day (QID) | ORAL | Status: AC | PRN

## 2012-03-24 NOTE — Telephone Encounter (Signed)
Pt is S/P AVR, CABG. RX for Hydrocodone 5/500 mg #40/0 called into [facility]. Pending appt with Dr Tyrone Sage on 04/17/12

## 2012-03-26 DIAGNOSIS — I2510 Atherosclerotic heart disease of native coronary artery without angina pectoris: Secondary | ICD-10-CM | POA: Diagnosis not present

## 2012-03-26 DIAGNOSIS — I10 Essential (primary) hypertension: Secondary | ICD-10-CM | POA: Diagnosis not present

## 2012-03-26 DIAGNOSIS — T8131XA Disruption of external operation (surgical) wound, not elsewhere classified, initial encounter: Secondary | ICD-10-CM | POA: Diagnosis not present

## 2012-03-26 DIAGNOSIS — Z7901 Long term (current) use of anticoagulants: Secondary | ICD-10-CM | POA: Diagnosis not present

## 2012-03-26 DIAGNOSIS — Z5181 Encounter for therapeutic drug level monitoring: Secondary | ICD-10-CM | POA: Diagnosis not present

## 2012-03-28 DIAGNOSIS — Z5181 Encounter for therapeutic drug level monitoring: Secondary | ICD-10-CM | POA: Diagnosis not present

## 2012-03-28 DIAGNOSIS — I10 Essential (primary) hypertension: Secondary | ICD-10-CM | POA: Diagnosis not present

## 2012-03-28 DIAGNOSIS — I2510 Atherosclerotic heart disease of native coronary artery without angina pectoris: Secondary | ICD-10-CM | POA: Diagnosis not present

## 2012-03-28 DIAGNOSIS — T8131XA Disruption of external operation (surgical) wound, not elsewhere classified, initial encounter: Secondary | ICD-10-CM | POA: Diagnosis not present

## 2012-03-28 DIAGNOSIS — Z7901 Long term (current) use of anticoagulants: Secondary | ICD-10-CM | POA: Diagnosis not present

## 2012-03-31 DIAGNOSIS — I2510 Atherosclerotic heart disease of native coronary artery without angina pectoris: Secondary | ICD-10-CM | POA: Diagnosis not present

## 2012-03-31 DIAGNOSIS — Z5181 Encounter for therapeutic drug level monitoring: Secondary | ICD-10-CM | POA: Diagnosis not present

## 2012-03-31 DIAGNOSIS — Z7901 Long term (current) use of anticoagulants: Secondary | ICD-10-CM | POA: Diagnosis not present

## 2012-03-31 DIAGNOSIS — T8131XA Disruption of external operation (surgical) wound, not elsewhere classified, initial encounter: Secondary | ICD-10-CM | POA: Diagnosis not present

## 2012-03-31 DIAGNOSIS — I10 Essential (primary) hypertension: Secondary | ICD-10-CM | POA: Diagnosis not present

## 2012-04-02 DIAGNOSIS — I2510 Atherosclerotic heart disease of native coronary artery without angina pectoris: Secondary | ICD-10-CM | POA: Diagnosis not present

## 2012-04-02 DIAGNOSIS — T8131XA Disruption of external operation (surgical) wound, not elsewhere classified, initial encounter: Secondary | ICD-10-CM | POA: Diagnosis not present

## 2012-04-02 DIAGNOSIS — Z7901 Long term (current) use of anticoagulants: Secondary | ICD-10-CM | POA: Diagnosis not present

## 2012-04-02 DIAGNOSIS — Z5181 Encounter for therapeutic drug level monitoring: Secondary | ICD-10-CM | POA: Diagnosis not present

## 2012-04-02 DIAGNOSIS — I10 Essential (primary) hypertension: Secondary | ICD-10-CM | POA: Diagnosis not present

## 2012-04-04 DIAGNOSIS — T8131XA Disruption of external operation (surgical) wound, not elsewhere classified, initial encounter: Secondary | ICD-10-CM | POA: Diagnosis not present

## 2012-04-04 DIAGNOSIS — I10 Essential (primary) hypertension: Secondary | ICD-10-CM | POA: Diagnosis not present

## 2012-04-04 DIAGNOSIS — I2510 Atherosclerotic heart disease of native coronary artery without angina pectoris: Secondary | ICD-10-CM | POA: Diagnosis not present

## 2012-04-04 DIAGNOSIS — Z5181 Encounter for therapeutic drug level monitoring: Secondary | ICD-10-CM | POA: Diagnosis not present

## 2012-04-04 DIAGNOSIS — Z7901 Long term (current) use of anticoagulants: Secondary | ICD-10-CM | POA: Diagnosis not present

## 2012-04-10 DIAGNOSIS — Z954 Presence of other heart-valve replacement: Secondary | ICD-10-CM | POA: Diagnosis not present

## 2012-04-10 DIAGNOSIS — I10 Essential (primary) hypertension: Secondary | ICD-10-CM | POA: Diagnosis not present

## 2012-04-10 DIAGNOSIS — I2510 Atherosclerotic heart disease of native coronary artery without angina pectoris: Secondary | ICD-10-CM | POA: Diagnosis not present

## 2012-04-10 DIAGNOSIS — E785 Hyperlipidemia, unspecified: Secondary | ICD-10-CM | POA: Diagnosis not present

## 2012-04-10 DIAGNOSIS — I359 Nonrheumatic aortic valve disorder, unspecified: Secondary | ICD-10-CM | POA: Diagnosis not present

## 2012-04-10 DIAGNOSIS — Z7901 Long term (current) use of anticoagulants: Secondary | ICD-10-CM | POA: Diagnosis not present

## 2012-04-10 DIAGNOSIS — I4891 Unspecified atrial fibrillation: Secondary | ICD-10-CM | POA: Diagnosis not present

## 2012-04-10 DIAGNOSIS — Z951 Presence of aortocoronary bypass graft: Secondary | ICD-10-CM | POA: Diagnosis not present

## 2012-04-11 ENCOUNTER — Other Ambulatory Visit: Payer: Self-pay | Admitting: Cardiothoracic Surgery

## 2012-04-14 DIAGNOSIS — Z111 Encounter for screening for respiratory tuberculosis: Secondary | ICD-10-CM | POA: Diagnosis not present

## 2012-04-16 ENCOUNTER — Other Ambulatory Visit: Payer: Self-pay | Admitting: Thoracic Surgery

## 2012-04-17 ENCOUNTER — Ambulatory Visit (INDEPENDENT_AMBULATORY_CARE_PROVIDER_SITE_OTHER): Payer: Medicare Other | Admitting: Cardiothoracic Surgery

## 2012-04-17 ENCOUNTER — Encounter: Payer: Self-pay | Admitting: Cardiothoracic Surgery

## 2012-04-17 ENCOUNTER — Ambulatory Visit: Payer: Medicare Other | Admitting: Cardiothoracic Surgery

## 2012-04-17 VITALS — BP 148/74 | HR 84 | Resp 20 | Ht 63.0 in | Wt 142.0 lb

## 2012-04-17 DIAGNOSIS — Z9889 Other specified postprocedural states: Secondary | ICD-10-CM

## 2012-04-17 NOTE — Progress Notes (Signed)
                   [address]            [place] [address]          [phone]                                                         HPI: Patient returns for routine postoperative follow-up having undergone Cabg and tissure AVR. , she has superficial dehiscence/ infection from LLE OVH incision. . She continues with local wound care to the lower portion of the leg wound which is being treated with wet-to-dry dressing. Other than the leg she is markedly improved gaining strength and has decreased edema in the lower extremities.  Current Outpatient Prescriptions  Medication Sig Dispense Refill  . aspirin EC 81 MG tablet Take 81 mg by mouth daily.        Marland Kitchen atorvastatin (LIPITOR) 20 MG tablet Take 20 mg by mouth at bedtime.        . Calcium Carbonate-Vitamin D (CALCIUM 600 + D PO) Take 1 tablet by mouth 2 (two) times daily.        . cholecalciferol (VITAMIN D) 400 UNITS TABS Take 400 Units by mouth daily.        . furosemide (LASIX) 40 MG tablet Take 40 mg by mouth daily.      Marland Kitchen KLOR-CON M20 20 MEQ tablet TAKE 1 TABLET BY MOUTH ONCE DAILY  30 tablet  0  . levothyroxine (SYNTHROID, LEVOTHROID) 88 MCG tablet Take 1 tablet (88 mcg total) by mouth every morning.  30 tablet  0  . Lutein 20 MG CAPS Take 20 mg by mouth daily.        . metoprolol (LOPRESSOR) 50 MG tablet Take 0.5 tablets (25 mg total) by mouth 2 (two) times daily.      . Multiple Vitamin (MULITIVITAMIN WITH MINERALS) TABS Take 1 tablet by mouth daily.        . Multiple Vitamins-Minerals (OCUVITE PRESERVISION PO) Take 1 tablet by mouth 2 (two) times daily.        . vitamin C (ASCORBIC ACID) 500 MG tablet Take 500 mg by mouth daily.        Marland Kitchen warfarin (COUMADIN) 5 MG tablet Take 5 mg by mouth daily.      Marland Kitchen gabapentin (NEURONTIN) 300 MG capsule Take 300 mg by mouth 3 (three) times daily.        Marland Kitchen DISCONTD: amiodarone (PACERONE) 200 MG tablet Take 200 mg by mouth daily.        Physical Exam: Lower extremity- the  left lower extremity below the knee now completely healed.  Llower extremity edema in both legs is much improved. The right evh site at knee has superficial dehiscence with scant drainage as well.  Chest incision: Healing well without evidence of infection.    Diagnostic Tests: No results found.  Impression: Superficial OVH L leg/R EVH site dehiscence with continued local wound care  Plan: We'll continue with wet-to-dry hydrogel dressings daily left lower leg Return PRN

## 2012-04-18 DIAGNOSIS — Z954 Presence of other heart-valve replacement: Secondary | ICD-10-CM | POA: Diagnosis not present

## 2012-04-18 DIAGNOSIS — I4891 Unspecified atrial fibrillation: Secondary | ICD-10-CM | POA: Diagnosis not present

## 2012-04-18 DIAGNOSIS — I2510 Atherosclerotic heart disease of native coronary artery without angina pectoris: Secondary | ICD-10-CM | POA: Diagnosis not present

## 2012-04-18 DIAGNOSIS — Z7901 Long term (current) use of anticoagulants: Secondary | ICD-10-CM | POA: Diagnosis not present

## 2012-04-18 DIAGNOSIS — I359 Nonrheumatic aortic valve disorder, unspecified: Secondary | ICD-10-CM | POA: Diagnosis not present

## 2012-04-18 DIAGNOSIS — I10 Essential (primary) hypertension: Secondary | ICD-10-CM | POA: Diagnosis not present

## 2012-04-18 DIAGNOSIS — Z951 Presence of aortocoronary bypass graft: Secondary | ICD-10-CM | POA: Diagnosis not present

## 2012-04-18 DIAGNOSIS — E785 Hyperlipidemia, unspecified: Secondary | ICD-10-CM | POA: Diagnosis not present

## 2012-04-21 ENCOUNTER — Encounter (HOSPITAL_COMMUNITY): Payer: Medicare Other

## 2012-04-21 DIAGNOSIS — B351 Tinea unguium: Secondary | ICD-10-CM | POA: Diagnosis not present

## 2012-04-21 DIAGNOSIS — M79609 Pain in unspecified limb: Secondary | ICD-10-CM | POA: Diagnosis not present

## 2012-04-23 ENCOUNTER — Encounter (HOSPITAL_COMMUNITY): Payer: Medicare Other

## 2012-04-24 ENCOUNTER — Encounter (HOSPITAL_COMMUNITY): Payer: Self-pay

## 2012-04-24 ENCOUNTER — Encounter (HOSPITAL_COMMUNITY)
Admission: RE | Admit: 2012-04-24 | Discharge: 2012-04-24 | Disposition: A | Discharge status: Home / Self Care | Payer: Medicare Other | Source: Ambulatory Visit | Attending: Cardiology | Admitting: Cardiology

## 2012-04-24 DIAGNOSIS — I4892 Unspecified atrial flutter: Secondary | ICD-10-CM | POA: Insufficient documentation

## 2012-04-24 DIAGNOSIS — Z5189 Encounter for other specified aftercare: Secondary | ICD-10-CM | POA: Insufficient documentation

## 2012-04-24 DIAGNOSIS — I10 Essential (primary) hypertension: Secondary | ICD-10-CM | POA: Diagnosis not present

## 2012-04-24 DIAGNOSIS — M129 Arthropathy, unspecified: Secondary | ICD-10-CM | POA: Insufficient documentation

## 2012-04-24 DIAGNOSIS — I4891 Unspecified atrial fibrillation: Secondary | ICD-10-CM | POA: Diagnosis not present

## 2012-04-24 DIAGNOSIS — Z7901 Long term (current) use of anticoagulants: Secondary | ICD-10-CM | POA: Diagnosis not present

## 2012-04-24 DIAGNOSIS — E785 Hyperlipidemia, unspecified: Secondary | ICD-10-CM | POA: Diagnosis not present

## 2012-04-24 DIAGNOSIS — Z954 Presence of other heart-valve replacement: Secondary | ICD-10-CM | POA: Diagnosis not present

## 2012-04-24 DIAGNOSIS — I359 Nonrheumatic aortic valve disorder, unspecified: Secondary | ICD-10-CM | POA: Insufficient documentation

## 2012-04-24 DIAGNOSIS — I2510 Atherosclerotic heart disease of native coronary artery without angina pectoris: Secondary | ICD-10-CM | POA: Diagnosis not present

## 2012-04-24 DIAGNOSIS — Z951 Presence of aortocoronary bypass graft: Secondary | ICD-10-CM | POA: Diagnosis not present

## 2012-04-24 DIAGNOSIS — E669 Obesity, unspecified: Secondary | ICD-10-CM | POA: Insufficient documentation

## 2012-04-24 DIAGNOSIS — I119 Hypertensive heart disease without heart failure: Secondary | ICD-10-CM | POA: Insufficient documentation

## 2012-04-24 DIAGNOSIS — E039 Hypothyroidism, unspecified: Secondary | ICD-10-CM | POA: Insufficient documentation

## 2012-04-24 DIAGNOSIS — Z7982 Long term (current) use of aspirin: Secondary | ICD-10-CM | POA: Insufficient documentation

## 2012-04-24 NOTE — Progress Notes (Signed)
Cardiac Rehab Medication Review by a Pharmacist  Does the patient  feel that his/her medications are working for him/her?  yes  Has the patient been experiencing any side effects to the medications prescribed?  no  Does the patient measure his/her own blood pressure or blood glucose at home?  yes   Does the patient have any problems obtaining medications due to transportation or finances?   no  Understanding of regimen: excellent Understanding of indications: excellent Potential of compliance: excellent    Pharmacist comments: [name] does an excellent job with her medications and is very understanding of her regimen.    Carol [name], [place] R 04/24/2012 9:10 AM

## 2012-04-25 ENCOUNTER — Encounter (HOSPITAL_COMMUNITY): Payer: Medicare Other

## 2012-04-28 ENCOUNTER — Ambulatory Visit (HOSPITAL_COMMUNITY): Payer: Medicare Other

## 2012-04-28 ENCOUNTER — Encounter (HOSPITAL_COMMUNITY): Payer: Medicare Other

## 2012-04-29 DIAGNOSIS — H2510 Age-related nuclear cataract, unspecified eye: Secondary | ICD-10-CM | POA: Diagnosis not present

## 2012-04-30 ENCOUNTER — Encounter (HOSPITAL_COMMUNITY): Payer: Self-pay

## 2012-04-30 ENCOUNTER — Encounter (HOSPITAL_COMMUNITY)
Admission: RE | Admit: 2012-04-30 | Discharge: 2012-04-30 | Disposition: A | Discharge status: Home / Self Care | Payer: Medicare Other | Source: Ambulatory Visit | Attending: Cardiology | Admitting: Cardiology

## 2012-04-30 DIAGNOSIS — I359 Nonrheumatic aortic valve disorder, unspecified: Secondary | ICD-10-CM | POA: Diagnosis not present

## 2012-04-30 DIAGNOSIS — Z5189 Encounter for other specified aftercare: Secondary | ICD-10-CM | POA: Diagnosis not present

## 2012-04-30 DIAGNOSIS — I119 Hypertensive heart disease without heart failure: Secondary | ICD-10-CM | POA: Diagnosis not present

## 2012-04-30 DIAGNOSIS — I2510 Atherosclerotic heart disease of native coronary artery without angina pectoris: Secondary | ICD-10-CM | POA: Diagnosis not present

## 2012-04-30 DIAGNOSIS — I4892 Unspecified atrial flutter: Secondary | ICD-10-CM | POA: Diagnosis not present

## 2012-04-30 DIAGNOSIS — E785 Hyperlipidemia, unspecified: Secondary | ICD-10-CM | POA: Diagnosis not present

## 2012-04-30 DIAGNOSIS — E669 Obesity, unspecified: Secondary | ICD-10-CM | POA: Diagnosis not present

## 2012-04-30 DIAGNOSIS — Z951 Presence of aortocoronary bypass graft: Secondary | ICD-10-CM | POA: Diagnosis not present

## 2012-04-30 DIAGNOSIS — Z954 Presence of other heart-valve replacement: Secondary | ICD-10-CM | POA: Diagnosis not present

## 2012-04-30 DIAGNOSIS — E039 Hypothyroidism, unspecified: Secondary | ICD-10-CM | POA: Diagnosis not present

## 2012-04-30 DIAGNOSIS — Z7982 Long term (current) use of aspirin: Secondary | ICD-10-CM | POA: Diagnosis not present

## 2012-04-30 DIAGNOSIS — M129 Arthropathy, unspecified: Secondary | ICD-10-CM | POA: Diagnosis not present

## 2012-04-30 NOTE — Progress Notes (Addendum)
Pt started cardiac rehab today.  Pt tolerated light exercise without difficulty.  Asymptomatic with exercise.  VSS, telemetry-SR, Non specific ST-T wave changes, frequent PVCs, occasional PAC.   Pt frequently exceeded THR, workloads decreased. Pt oriented to exercise equipment and routine.  Understanding verbalized.  Will continue to monitor the patient throughout  the program.

## 2012-05-01 NOTE — Progress Notes (Signed)
Carol [name] 78 y.o. female       Nutrition Screen                                                                    YES  NO Do you live in a nursing home?  X   Do you eat out more than 3 times/week?    X If yes, how many times per week do you eat out?  Do you have food allergies?   X If yes, what are you allergic to?  Have you gained or lost more than 10 lbs without trying?               X If yes, how much weight have you lost and over what time period?  lbs gained or lost over  weeks/month  Do you want to lose weight?     X If yes, what is a goal weight or amount of weight you would like to lose?  lb  Do you eat alone most of the time?   X   Do you eat less than 2 meals/day?  X If yes, how many meals do you eat?  Do you drink more than 3 alcohol drinks/day?  X If yes, how many drinks per day?  Are you having trouble with constipation? *  X If yes, what are you doing to help relieve constipation?  Do you have financial difficulties with buying food?*    X   Are you experiencing regular nausea/ vomiting?*     X   Do you have a poor appetite? *                                        X   Do you have trouble chewing/swallowing? *   X    Pt with diagnoses of:  X CABG              X AVR/MVR/ICD      X Dyslipidemia  / HDL< 40 / LDL>70 / High TG      X >63 years old  X %  Body fat >goal / Body Mass Index >25 X HTN / BP >120/80        Pt Risk Score   0       Diagnosis Risk Score  30       Total Risk Score   30                         High Risk               X Low Risk    HT: 60.75" Ht Readings from Last 1 Encounters:  04/24/12 5' 0.75" (1.543 m)    WT:   142.8 lb (64.9 kg) Wt Readings from Last 3 Encounters:  04/24/12 143 lb 1.3 oz (64.9 kg)  04/17/12 142 lb (64.411 kg)  03/06/12 144 lb (65.318 kg)     IBW 47.2 138%IBW BMI 27.3 40.3%body fat  Meds reviewed: Calcium Carbonate with vitamin D, vitamin D, MVI, Lutein, Ocuvite MVI, vitamin C,  Coumadin,Hydrochlorothiazide Past Medical History  Diagnosis  Date  . Hyperlipidemia   . Hypothyroidism   . Spinal stenosis   . Lumbar disc disease   . Obesity   . Aortic stenosis   . Dyspnea   . Lung nodules     Sees Wert, thought to be benign  . Hypertensive heart disease without CHF   . Villous adenoma of rectum   . Coronary artery disease     Cath 1/13 left main       Activity level: Pt is moderately active  Wt goal: 143 lb ( 64.9 kg) Current tobacco use? No Food/Drug Interaction? Yes      If Y, which med(s)? Coumadin If yes, pt given Food/Drug Interaction handout? Yes Labs:  Lipid Panel  No results found for this basename: chol, trig, hdl, cholhdl, vldl, ldlcalc   Lab Results  Component Value Date   HGBA1C 4.8 12/27/2011  01/21/12 Glucose 106  LDL goal: < 100      MI, DM, Carotid or PVD and > 2:      HTN, >76 yo female,  Estimated Daily Nutrition Needs for: ? wt maintenance 1600-1800 Kcal , Total Fat 50-60gm, Saturated Fat 12-14 gm, Trans Fat 1.7-2.0 gm,  Sodium less than 1500 mg

## 2012-05-02 ENCOUNTER — Encounter (HOSPITAL_COMMUNITY)
Admission: RE | Admit: 2012-05-02 | Discharge: 2012-05-02 | Disposition: A | Discharge status: Home / Self Care | Payer: Medicare Other | Source: Ambulatory Visit | Attending: Cardiology | Admitting: Cardiology

## 2012-05-02 DIAGNOSIS — I2510 Atherosclerotic heart disease of native coronary artery without angina pectoris: Secondary | ICD-10-CM | POA: Diagnosis not present

## 2012-05-02 DIAGNOSIS — Z954 Presence of other heart-valve replacement: Secondary | ICD-10-CM | POA: Diagnosis not present

## 2012-05-02 DIAGNOSIS — Z5189 Encounter for other specified aftercare: Secondary | ICD-10-CM | POA: Diagnosis not present

## 2012-05-02 DIAGNOSIS — I359 Nonrheumatic aortic valve disorder, unspecified: Secondary | ICD-10-CM | POA: Diagnosis not present

## 2012-05-02 DIAGNOSIS — I4892 Unspecified atrial flutter: Secondary | ICD-10-CM | POA: Diagnosis not present

## 2012-05-02 DIAGNOSIS — Z951 Presence of aortocoronary bypass graft: Secondary | ICD-10-CM | POA: Diagnosis not present

## 2012-05-05 ENCOUNTER — Encounter (HOSPITAL_COMMUNITY): Payer: Medicare Other

## 2012-05-07 ENCOUNTER — Encounter (HOSPITAL_COMMUNITY)
Admission: RE | Admit: 2012-05-07 | Discharge: 2012-05-07 | Disposition: A | Discharge status: Home / Self Care | Payer: Medicare Other | Source: Ambulatory Visit | Attending: Cardiology | Admitting: Cardiology

## 2012-05-07 DIAGNOSIS — Z5189 Encounter for other specified aftercare: Secondary | ICD-10-CM | POA: Diagnosis not present

## 2012-05-07 DIAGNOSIS — Z954 Presence of other heart-valve replacement: Secondary | ICD-10-CM | POA: Diagnosis not present

## 2012-05-07 DIAGNOSIS — I359 Nonrheumatic aortic valve disorder, unspecified: Secondary | ICD-10-CM | POA: Diagnosis not present

## 2012-05-07 DIAGNOSIS — Z951 Presence of aortocoronary bypass graft: Secondary | ICD-10-CM | POA: Diagnosis not present

## 2012-05-07 DIAGNOSIS — I2510 Atherosclerotic heart disease of native coronary artery without angina pectoris: Secondary | ICD-10-CM | POA: Diagnosis not present

## 2012-05-07 DIAGNOSIS — I4892 Unspecified atrial flutter: Secondary | ICD-10-CM | POA: Diagnosis not present

## 2012-05-08 DIAGNOSIS — Z7901 Long term (current) use of anticoagulants: Secondary | ICD-10-CM | POA: Diagnosis not present

## 2012-05-08 DIAGNOSIS — I2510 Atherosclerotic heart disease of native coronary artery without angina pectoris: Secondary | ICD-10-CM | POA: Diagnosis not present

## 2012-05-08 DIAGNOSIS — Z951 Presence of aortocoronary bypass graft: Secondary | ICD-10-CM | POA: Diagnosis not present

## 2012-05-08 DIAGNOSIS — I10 Essential (primary) hypertension: Secondary | ICD-10-CM | POA: Diagnosis not present

## 2012-05-08 DIAGNOSIS — I359 Nonrheumatic aortic valve disorder, unspecified: Secondary | ICD-10-CM | POA: Diagnosis not present

## 2012-05-08 DIAGNOSIS — I4891 Unspecified atrial fibrillation: Secondary | ICD-10-CM | POA: Diagnosis not present

## 2012-05-08 DIAGNOSIS — E785 Hyperlipidemia, unspecified: Secondary | ICD-10-CM | POA: Diagnosis not present

## 2012-05-08 DIAGNOSIS — Z954 Presence of other heart-valve replacement: Secondary | ICD-10-CM | POA: Diagnosis not present

## 2012-05-09 ENCOUNTER — Encounter (HOSPITAL_COMMUNITY)
Admission: RE | Admit: 2012-05-09 | Discharge: 2012-05-09 | Disposition: A | Discharge status: Home / Self Care | Payer: Medicare Other | Source: Ambulatory Visit | Attending: Cardiology | Admitting: Cardiology

## 2012-05-09 DIAGNOSIS — I4892 Unspecified atrial flutter: Secondary | ICD-10-CM | POA: Diagnosis not present

## 2012-05-09 DIAGNOSIS — Z5189 Encounter for other specified aftercare: Secondary | ICD-10-CM | POA: Diagnosis not present

## 2012-05-09 DIAGNOSIS — Z954 Presence of other heart-valve replacement: Secondary | ICD-10-CM | POA: Diagnosis not present

## 2012-05-09 DIAGNOSIS — I2510 Atherosclerotic heart disease of native coronary artery without angina pectoris: Secondary | ICD-10-CM | POA: Diagnosis not present

## 2012-05-09 DIAGNOSIS — Z951 Presence of aortocoronary bypass graft: Secondary | ICD-10-CM | POA: Diagnosis not present

## 2012-05-09 DIAGNOSIS — I359 Nonrheumatic aortic valve disorder, unspecified: Secondary | ICD-10-CM | POA: Diagnosis not present

## 2012-05-12 ENCOUNTER — Encounter (HOSPITAL_COMMUNITY): Payer: Medicare Other

## 2012-05-14 ENCOUNTER — Encounter (HOSPITAL_COMMUNITY)
Admission: RE | Admit: 2012-05-14 | Discharge: 2012-05-14 | Disposition: A | Discharge status: Home / Self Care | Payer: Medicare Other | Source: Ambulatory Visit | Attending: Cardiology | Admitting: Cardiology

## 2012-05-14 DIAGNOSIS — Z5189 Encounter for other specified aftercare: Secondary | ICD-10-CM | POA: Diagnosis not present

## 2012-05-14 DIAGNOSIS — I2510 Atherosclerotic heart disease of native coronary artery without angina pectoris: Secondary | ICD-10-CM | POA: Diagnosis not present

## 2012-05-14 DIAGNOSIS — Z954 Presence of other heart-valve replacement: Secondary | ICD-10-CM | POA: Diagnosis not present

## 2012-05-14 DIAGNOSIS — I359 Nonrheumatic aortic valve disorder, unspecified: Secondary | ICD-10-CM | POA: Diagnosis not present

## 2012-05-14 DIAGNOSIS — Z951 Presence of aortocoronary bypass graft: Secondary | ICD-10-CM | POA: Diagnosis not present

## 2012-05-14 DIAGNOSIS — I4892 Unspecified atrial flutter: Secondary | ICD-10-CM | POA: Diagnosis not present

## 2012-05-16 ENCOUNTER — Encounter (HOSPITAL_COMMUNITY)
Admission: RE | Admit: 2012-05-16 | Discharge: 2012-05-16 | Disposition: A | Discharge status: Home / Self Care | Payer: Medicare Other | Source: Ambulatory Visit | Attending: Cardiology | Admitting: Cardiology

## 2012-05-16 DIAGNOSIS — I4892 Unspecified atrial flutter: Secondary | ICD-10-CM | POA: Diagnosis not present

## 2012-05-16 DIAGNOSIS — I2510 Atherosclerotic heart disease of native coronary artery without angina pectoris: Secondary | ICD-10-CM | POA: Diagnosis not present

## 2012-05-16 DIAGNOSIS — Z5189 Encounter for other specified aftercare: Secondary | ICD-10-CM | POA: Diagnosis not present

## 2012-05-16 DIAGNOSIS — I359 Nonrheumatic aortic valve disorder, unspecified: Secondary | ICD-10-CM | POA: Diagnosis not present

## 2012-05-16 DIAGNOSIS — Z954 Presence of other heart-valve replacement: Secondary | ICD-10-CM | POA: Diagnosis not present

## 2012-05-16 DIAGNOSIS — Z951 Presence of aortocoronary bypass graft: Secondary | ICD-10-CM | POA: Diagnosis not present

## 2012-05-16 NOTE — Progress Notes (Signed)
Pt arrived at cardiac rehab c/o skin tear that occurred 2 days ago at home.  Pt had dressing place on right shin, dressing CD&I.  Dressing removed, no drainage, swelling redness.  Bruising present on shin with obvious skin tear/superficial laceration.  Pt advised to continue to keep wound clean and dry, applying clean non stick dressing daily.  Pt instructed to avoid neosporin at this time and to notify md if s/s of infection, bleeding occur.  Understanding verbalized.  Clean, dry nonadherant dressing applied.

## 2012-05-19 ENCOUNTER — Encounter (HOSPITAL_COMMUNITY): Payer: Medicare Other

## 2012-05-21 ENCOUNTER — Encounter (HOSPITAL_COMMUNITY)
Admission: RE | Admit: 2012-05-21 | Discharge: 2012-05-21 | Disposition: A | Discharge status: Home / Self Care | Payer: Medicare Other | Source: Ambulatory Visit | Attending: Cardiology | Admitting: Cardiology

## 2012-05-21 DIAGNOSIS — I2510 Atherosclerotic heart disease of native coronary artery without angina pectoris: Secondary | ICD-10-CM | POA: Diagnosis not present

## 2012-05-21 DIAGNOSIS — Z954 Presence of other heart-valve replacement: Secondary | ICD-10-CM | POA: Diagnosis not present

## 2012-05-21 DIAGNOSIS — I4892 Unspecified atrial flutter: Secondary | ICD-10-CM | POA: Diagnosis not present

## 2012-05-21 DIAGNOSIS — Z951 Presence of aortocoronary bypass graft: Secondary | ICD-10-CM | POA: Diagnosis not present

## 2012-05-21 DIAGNOSIS — I359 Nonrheumatic aortic valve disorder, unspecified: Secondary | ICD-10-CM | POA: Diagnosis not present

## 2012-05-21 DIAGNOSIS — Z5189 Encounter for other specified aftercare: Secondary | ICD-10-CM | POA: Diagnosis not present

## 2012-05-21 NOTE — Progress Notes (Signed)
Reviewed home exercise with pt today.  Pt plans to do balance exercises and walk at home for exercise.  Reviewed THR, pulse, RPE, sign and symptoms, NTG use, and when to call 911 or MD.  Pt voiced understanding. Electronically signed by Harriett Sine MS on Wednesday May 21 2012 at 1439

## 2012-05-22 DIAGNOSIS — I2510 Atherosclerotic heart disease of native coronary artery without angina pectoris: Secondary | ICD-10-CM | POA: Diagnosis not present

## 2012-05-22 DIAGNOSIS — I4891 Unspecified atrial fibrillation: Secondary | ICD-10-CM | POA: Diagnosis not present

## 2012-05-22 DIAGNOSIS — I359 Nonrheumatic aortic valve disorder, unspecified: Secondary | ICD-10-CM | POA: Diagnosis not present

## 2012-05-22 DIAGNOSIS — Z7901 Long term (current) use of anticoagulants: Secondary | ICD-10-CM | POA: Diagnosis not present

## 2012-05-22 DIAGNOSIS — I10 Essential (primary) hypertension: Secondary | ICD-10-CM | POA: Diagnosis not present

## 2012-05-22 DIAGNOSIS — E785 Hyperlipidemia, unspecified: Secondary | ICD-10-CM | POA: Diagnosis not present

## 2012-05-22 DIAGNOSIS — Z954 Presence of other heart-valve replacement: Secondary | ICD-10-CM | POA: Diagnosis not present

## 2012-05-22 DIAGNOSIS — Z951 Presence of aortocoronary bypass graft: Secondary | ICD-10-CM | POA: Diagnosis not present

## 2012-05-23 ENCOUNTER — Encounter (HOSPITAL_COMMUNITY)
Admission: RE | Admit: 2012-05-23 | Discharge: 2012-05-23 | Disposition: A | Discharge status: Home / Self Care | Payer: Medicare Other | Source: Ambulatory Visit | Attending: Cardiology | Admitting: Cardiology

## 2012-05-23 DIAGNOSIS — I359 Nonrheumatic aortic valve disorder, unspecified: Secondary | ICD-10-CM | POA: Diagnosis not present

## 2012-05-23 DIAGNOSIS — Z951 Presence of aortocoronary bypass graft: Secondary | ICD-10-CM | POA: Diagnosis not present

## 2012-05-23 DIAGNOSIS — Z954 Presence of other heart-valve replacement: Secondary | ICD-10-CM | POA: Diagnosis not present

## 2012-05-23 DIAGNOSIS — I2510 Atherosclerotic heart disease of native coronary artery without angina pectoris: Secondary | ICD-10-CM | POA: Diagnosis not present

## 2012-05-23 DIAGNOSIS — Z5189 Encounter for other specified aftercare: Secondary | ICD-10-CM | POA: Diagnosis not present

## 2012-05-23 DIAGNOSIS — I4892 Unspecified atrial flutter: Secondary | ICD-10-CM | POA: Diagnosis not present

## 2012-05-23 NOTE — Progress Notes (Signed)
Carol [name] 78 y.o. female Nutrition Note Spoke with pt.  Nutrition Plan, Nutrition Survey, and cholesterol goals reviewed with pt. Pt is following Step 2 of the Therapeutic Lifestyle Changes diet. Age-appropriate diet recommendations reviewed. Pt watching sodium intake due to CHF. Pt briefly educated re: low sodium diet recommendations. Pt on Coumadin. Pt aware of food-nutrient interaction between Coumadin and vitamin K. Pt able to recall foods high in vitamin K. Consistent vitamin K intake discussed. Pt expressed understanding of above.   Nutrition Diagnosis   Food-and nutrition-related knowledge deficit related to lack of exposure to information as related to diagnosis of: ? CVD   Nutrition RX/ Estimated Daily Nutrition Needs for: wt maintenance 1600-1800 Kcal, 50-60 gm fat, 12-14 gm sat fat, 1.7-2.0 gm trans-fat, <1500 mg sodium  Nutrition Intervention   Pt's individual nutrition plan including cholesterol goals reviewed with pt.   Benefits of adopting Therapeutic Lifestyle Changes discussed when Medficts reviewed.   Pt to attend the Portion Distortion class   Pt to attend the  ? Nutrition I class                     ? Nutrition II class   Pt given handouts for: ? Consistent vit K diet ? low sodium    Continue client-centered nutrition education by RD, as part of interdisciplinary care. Goal(s)   Pt to describe the benefit of including fruits, vegetables, whole grains, and low-fat dairy products in a heart healthy meal plan. Monitor and Evaluate progress toward nutrition goal with team.

## 2012-05-26 ENCOUNTER — Encounter (HOSPITAL_COMMUNITY): Payer: Medicare Other

## 2012-05-28 ENCOUNTER — Encounter (HOSPITAL_COMMUNITY)
Admission: RE | Admit: 2012-05-28 | Discharge: 2012-05-28 | Disposition: A | Discharge status: Home / Self Care | Payer: Medicare Other | Source: Ambulatory Visit | Attending: Cardiology | Admitting: Cardiology

## 2012-05-28 DIAGNOSIS — Z951 Presence of aortocoronary bypass graft: Secondary | ICD-10-CM | POA: Insufficient documentation

## 2012-05-28 DIAGNOSIS — M129 Arthropathy, unspecified: Secondary | ICD-10-CM | POA: Diagnosis not present

## 2012-05-28 DIAGNOSIS — E785 Hyperlipidemia, unspecified: Secondary | ICD-10-CM | POA: Diagnosis not present

## 2012-05-28 DIAGNOSIS — Z7982 Long term (current) use of aspirin: Secondary | ICD-10-CM | POA: Insufficient documentation

## 2012-05-28 DIAGNOSIS — E039 Hypothyroidism, unspecified: Secondary | ICD-10-CM | POA: Insufficient documentation

## 2012-05-28 DIAGNOSIS — Z5189 Encounter for other specified aftercare: Secondary | ICD-10-CM | POA: Insufficient documentation

## 2012-05-28 DIAGNOSIS — I2510 Atherosclerotic heart disease of native coronary artery without angina pectoris: Secondary | ICD-10-CM | POA: Insufficient documentation

## 2012-05-28 DIAGNOSIS — I359 Nonrheumatic aortic valve disorder, unspecified: Secondary | ICD-10-CM | POA: Diagnosis not present

## 2012-05-28 DIAGNOSIS — E669 Obesity, unspecified: Secondary | ICD-10-CM | POA: Diagnosis not present

## 2012-05-28 DIAGNOSIS — I4892 Unspecified atrial flutter: Secondary | ICD-10-CM | POA: Diagnosis not present

## 2012-05-28 DIAGNOSIS — I119 Hypertensive heart disease without heart failure: Secondary | ICD-10-CM | POA: Diagnosis not present

## 2012-05-28 DIAGNOSIS — Z954 Presence of other heart-valve replacement: Secondary | ICD-10-CM | POA: Diagnosis not present

## 2012-05-30 ENCOUNTER — Encounter (HOSPITAL_COMMUNITY): Payer: Medicare Other

## 2012-06-02 ENCOUNTER — Encounter (HOSPITAL_COMMUNITY): Payer: Medicare Other

## 2012-06-02 DIAGNOSIS — H2510 Age-related nuclear cataract, unspecified eye: Secondary | ICD-10-CM | POA: Diagnosis not present

## 2012-06-02 DIAGNOSIS — H269 Unspecified cataract: Secondary | ICD-10-CM | POA: Diagnosis not present

## 2012-06-04 ENCOUNTER — Encounter (HOSPITAL_COMMUNITY): Payer: Medicare Other

## 2012-06-05 DIAGNOSIS — Z951 Presence of aortocoronary bypass graft: Secondary | ICD-10-CM | POA: Diagnosis not present

## 2012-06-05 DIAGNOSIS — I359 Nonrheumatic aortic valve disorder, unspecified: Secondary | ICD-10-CM | POA: Diagnosis not present

## 2012-06-05 DIAGNOSIS — I4891 Unspecified atrial fibrillation: Secondary | ICD-10-CM | POA: Diagnosis not present

## 2012-06-05 DIAGNOSIS — Z954 Presence of other heart-valve replacement: Secondary | ICD-10-CM | POA: Diagnosis not present

## 2012-06-05 DIAGNOSIS — I10 Essential (primary) hypertension: Secondary | ICD-10-CM | POA: Diagnosis not present

## 2012-06-05 DIAGNOSIS — I2510 Atherosclerotic heart disease of native coronary artery without angina pectoris: Secondary | ICD-10-CM | POA: Diagnosis not present

## 2012-06-05 DIAGNOSIS — E785 Hyperlipidemia, unspecified: Secondary | ICD-10-CM | POA: Diagnosis not present

## 2012-06-05 DIAGNOSIS — Z7901 Long term (current) use of anticoagulants: Secondary | ICD-10-CM | POA: Diagnosis not present

## 2012-06-06 ENCOUNTER — Telehealth (HOSPITAL_COMMUNITY): Payer: Self-pay | Admitting: Cardiac Rehabilitation

## 2012-06-06 ENCOUNTER — Encounter (HOSPITAL_COMMUNITY): Payer: Medicare Other

## 2012-06-09 ENCOUNTER — Encounter (HOSPITAL_COMMUNITY): Payer: Medicare Other

## 2012-06-11 ENCOUNTER — Encounter (HOSPITAL_COMMUNITY)
Admission: RE | Admit: 2012-06-11 | Discharge: 2012-06-11 | Disposition: A | Discharge status: Home / Self Care | Payer: Medicare Other | Source: Ambulatory Visit | Attending: Cardiology | Admitting: Cardiology

## 2012-06-13 ENCOUNTER — Encounter (HOSPITAL_COMMUNITY)
Admission: RE | Admit: 2012-06-13 | Discharge: 2012-06-13 | Disposition: A | Discharge status: Home / Self Care | Payer: Medicare Other | Source: Ambulatory Visit | Attending: Cardiology | Admitting: Cardiology

## 2012-06-16 ENCOUNTER — Encounter (HOSPITAL_COMMUNITY): Payer: Medicare Other

## 2012-06-18 ENCOUNTER — Encounter (HOSPITAL_COMMUNITY)
Admission: RE | Admit: 2012-06-18 | Discharge: 2012-06-18 | Disposition: A | Discharge status: Home / Self Care | Payer: Medicare Other | Source: Ambulatory Visit | Attending: Cardiology | Admitting: Cardiology

## 2012-06-20 ENCOUNTER — Encounter (HOSPITAL_COMMUNITY)
Admission: RE | Admit: 2012-06-20 | Discharge: 2012-06-20 | Disposition: A | Discharge status: Home / Self Care | Payer: Medicare Other | Source: Ambulatory Visit | Attending: Cardiology | Admitting: Cardiology

## 2012-06-23 ENCOUNTER — Encounter (HOSPITAL_COMMUNITY): Payer: Medicare Other

## 2012-06-25 ENCOUNTER — Encounter (HOSPITAL_COMMUNITY)
Admission: RE | Admit: 2012-06-25 | Discharge: 2012-06-25 | Disposition: A | Discharge status: Home / Self Care | Payer: Medicare Other | Source: Ambulatory Visit | Attending: Cardiology | Admitting: Cardiology

## 2012-06-25 DIAGNOSIS — I2510 Atherosclerotic heart disease of native coronary artery without angina pectoris: Secondary | ICD-10-CM | POA: Insufficient documentation

## 2012-06-25 DIAGNOSIS — I4892 Unspecified atrial flutter: Secondary | ICD-10-CM | POA: Insufficient documentation

## 2012-06-25 DIAGNOSIS — Z5189 Encounter for other specified aftercare: Secondary | ICD-10-CM | POA: Diagnosis not present

## 2012-06-25 DIAGNOSIS — Z7982 Long term (current) use of aspirin: Secondary | ICD-10-CM | POA: Insufficient documentation

## 2012-06-25 DIAGNOSIS — M129 Arthropathy, unspecified: Secondary | ICD-10-CM | POA: Insufficient documentation

## 2012-06-25 DIAGNOSIS — I119 Hypertensive heart disease without heart failure: Secondary | ICD-10-CM | POA: Diagnosis not present

## 2012-06-25 DIAGNOSIS — E669 Obesity, unspecified: Secondary | ICD-10-CM | POA: Insufficient documentation

## 2012-06-25 DIAGNOSIS — Z951 Presence of aortocoronary bypass graft: Secondary | ICD-10-CM | POA: Diagnosis not present

## 2012-06-25 DIAGNOSIS — E039 Hypothyroidism, unspecified: Secondary | ICD-10-CM | POA: Diagnosis not present

## 2012-06-25 DIAGNOSIS — I359 Nonrheumatic aortic valve disorder, unspecified: Secondary | ICD-10-CM | POA: Diagnosis not present

## 2012-06-25 DIAGNOSIS — E785 Hyperlipidemia, unspecified: Secondary | ICD-10-CM | POA: Insufficient documentation

## 2012-06-25 DIAGNOSIS — Z954 Presence of other heart-valve replacement: Secondary | ICD-10-CM | POA: Diagnosis not present

## 2012-06-25 DIAGNOSIS — L989 Disorder of the skin and subcutaneous tissue, unspecified: Secondary | ICD-10-CM | POA: Diagnosis not present

## 2012-06-27 ENCOUNTER — Encounter (HOSPITAL_COMMUNITY): Payer: Medicare Other

## 2012-06-30 ENCOUNTER — Encounter (HOSPITAL_COMMUNITY): Payer: Medicare Other

## 2012-06-30 DIAGNOSIS — B351 Tinea unguium: Secondary | ICD-10-CM | POA: Diagnosis not present

## 2012-06-30 DIAGNOSIS — M79609 Pain in unspecified limb: Secondary | ICD-10-CM | POA: Diagnosis not present

## 2012-07-02 ENCOUNTER — Encounter (HOSPITAL_COMMUNITY)
Admission: RE | Admit: 2012-07-02 | Discharge: 2012-07-02 | Disposition: A | Discharge status: Home / Self Care | Payer: Medicare Other | Source: Ambulatory Visit | Attending: Cardiology | Admitting: Cardiology

## 2012-07-02 DIAGNOSIS — Z954 Presence of other heart-valve replacement: Secondary | ICD-10-CM | POA: Diagnosis not present

## 2012-07-02 DIAGNOSIS — I2510 Atherosclerotic heart disease of native coronary artery without angina pectoris: Secondary | ICD-10-CM | POA: Diagnosis not present

## 2012-07-02 DIAGNOSIS — I10 Essential (primary) hypertension: Secondary | ICD-10-CM | POA: Diagnosis not present

## 2012-07-02 DIAGNOSIS — Z951 Presence of aortocoronary bypass graft: Secondary | ICD-10-CM | POA: Diagnosis not present

## 2012-07-02 DIAGNOSIS — I4891 Unspecified atrial fibrillation: Secondary | ICD-10-CM | POA: Diagnosis not present

## 2012-07-02 DIAGNOSIS — I359 Nonrheumatic aortic valve disorder, unspecified: Secondary | ICD-10-CM | POA: Diagnosis not present

## 2012-07-02 DIAGNOSIS — E785 Hyperlipidemia, unspecified: Secondary | ICD-10-CM | POA: Diagnosis not present

## 2012-07-02 DIAGNOSIS — Z7901 Long term (current) use of anticoagulants: Secondary | ICD-10-CM | POA: Diagnosis not present

## 2012-07-04 ENCOUNTER — Encounter (HOSPITAL_COMMUNITY)
Admission: RE | Admit: 2012-07-04 | Discharge: 2012-07-04 | Disposition: A | Discharge status: Home / Self Care | Payer: Medicare Other | Source: Ambulatory Visit | Attending: Cardiology | Admitting: Cardiology

## 2012-07-07 ENCOUNTER — Encounter (HOSPITAL_COMMUNITY): Payer: Medicare Other

## 2012-07-09 ENCOUNTER — Encounter (HOSPITAL_COMMUNITY): Admission: RE | Admit: 2012-07-09 | Payer: Medicare Other | Source: Ambulatory Visit

## 2012-07-11 ENCOUNTER — Encounter (HOSPITAL_COMMUNITY): Payer: Medicare Other

## 2012-07-14 ENCOUNTER — Encounter (HOSPITAL_COMMUNITY): Payer: Medicare Other

## 2012-07-16 ENCOUNTER — Encounter (HOSPITAL_COMMUNITY): Payer: Medicare Other

## 2012-07-18 ENCOUNTER — Encounter (HOSPITAL_COMMUNITY): Payer: Medicare Other

## 2012-07-21 ENCOUNTER — Encounter (HOSPITAL_COMMUNITY): Payer: Medicare Other

## 2012-07-21 DIAGNOSIS — L989 Disorder of the skin and subcutaneous tissue, unspecified: Secondary | ICD-10-CM | POA: Diagnosis not present

## 2012-07-23 ENCOUNTER — Encounter (HOSPITAL_COMMUNITY): Payer: Medicare Other

## 2012-07-24 ENCOUNTER — Other Ambulatory Visit: Payer: Self-pay | Admitting: Family Medicine

## 2012-07-24 DIAGNOSIS — D485 Neoplasm of uncertain behavior of skin: Secondary | ICD-10-CM | POA: Diagnosis not present

## 2012-07-24 DIAGNOSIS — N6489 Other specified disorders of breast: Secondary | ICD-10-CM

## 2012-07-24 DIAGNOSIS — L570 Actinic keratosis: Secondary | ICD-10-CM | POA: Diagnosis not present

## 2012-07-25 ENCOUNTER — Encounter (HOSPITAL_COMMUNITY): Payer: Medicare Other

## 2012-07-31 DIAGNOSIS — Z7901 Long term (current) use of anticoagulants: Secondary | ICD-10-CM | POA: Diagnosis not present

## 2012-07-31 DIAGNOSIS — I4891 Unspecified atrial fibrillation: Secondary | ICD-10-CM | POA: Diagnosis not present

## 2012-08-04 ENCOUNTER — Encounter (HOSPITAL_COMMUNITY): Payer: Medicare Other | Attending: Cardiology

## 2012-08-04 DIAGNOSIS — I4892 Unspecified atrial flutter: Secondary | ICD-10-CM | POA: Insufficient documentation

## 2012-08-04 DIAGNOSIS — E039 Hypothyroidism, unspecified: Secondary | ICD-10-CM | POA: Insufficient documentation

## 2012-08-04 DIAGNOSIS — Z7982 Long term (current) use of aspirin: Secondary | ICD-10-CM | POA: Insufficient documentation

## 2012-08-04 DIAGNOSIS — I359 Nonrheumatic aortic valve disorder, unspecified: Secondary | ICD-10-CM | POA: Insufficient documentation

## 2012-08-04 DIAGNOSIS — Z954 Presence of other heart-valve replacement: Secondary | ICD-10-CM | POA: Insufficient documentation

## 2012-08-04 DIAGNOSIS — E669 Obesity, unspecified: Secondary | ICD-10-CM | POA: Insufficient documentation

## 2012-08-04 DIAGNOSIS — Z5189 Encounter for other specified aftercare: Secondary | ICD-10-CM | POA: Insufficient documentation

## 2012-08-04 DIAGNOSIS — M129 Arthropathy, unspecified: Secondary | ICD-10-CM | POA: Insufficient documentation

## 2012-08-04 DIAGNOSIS — Z951 Presence of aortocoronary bypass graft: Secondary | ICD-10-CM | POA: Insufficient documentation

## 2012-08-04 DIAGNOSIS — I2510 Atherosclerotic heart disease of native coronary artery without angina pectoris: Secondary | ICD-10-CM | POA: Insufficient documentation

## 2012-08-04 DIAGNOSIS — E785 Hyperlipidemia, unspecified: Secondary | ICD-10-CM | POA: Insufficient documentation

## 2012-08-04 DIAGNOSIS — I119 Hypertensive heart disease without heart failure: Secondary | ICD-10-CM | POA: Insufficient documentation

## 2012-08-06 ENCOUNTER — Encounter (HOSPITAL_COMMUNITY): Payer: Medicare Other

## 2012-08-07 ENCOUNTER — Other Ambulatory Visit: Payer: Self-pay | Admitting: Family Medicine

## 2012-08-07 DIAGNOSIS — I4891 Unspecified atrial fibrillation: Secondary | ICD-10-CM | POA: Diagnosis not present

## 2012-08-07 DIAGNOSIS — Z7901 Long term (current) use of anticoagulants: Secondary | ICD-10-CM | POA: Diagnosis not present

## 2012-08-08 ENCOUNTER — Encounter (HOSPITAL_COMMUNITY): Payer: Medicare Other

## 2012-08-11 ENCOUNTER — Encounter (HOSPITAL_COMMUNITY): Payer: Medicare Other

## 2012-08-13 ENCOUNTER — Encounter (HOSPITAL_COMMUNITY): Payer: Medicare Other

## 2012-08-15 ENCOUNTER — Encounter (HOSPITAL_COMMUNITY): Payer: Medicare Other

## 2012-08-18 ENCOUNTER — Encounter (HOSPITAL_COMMUNITY): Payer: Medicare Other

## 2012-08-18 DIAGNOSIS — Z7901 Long term (current) use of anticoagulants: Secondary | ICD-10-CM | POA: Diagnosis not present

## 2012-08-18 DIAGNOSIS — I4891 Unspecified atrial fibrillation: Secondary | ICD-10-CM | POA: Diagnosis not present

## 2012-08-19 NOTE — Progress Notes (Signed)
Pt completed cardiac rehab attending 12/36 exercise and 6 education sessions. Pt had average participation.  Pt had early withdrawal from program due to transportation issues and ability to exercise on her own at the retirement home.    Pt VSS, Telemetry-NSR.   Pt has made positive lifestyle changes and should be commended for herefforts.  Pt was a pleasure to work with.  Thank you for the referral.

## 2012-08-20 ENCOUNTER — Encounter (HOSPITAL_COMMUNITY): Payer: Medicare Other

## 2012-08-22 ENCOUNTER — Encounter (HOSPITAL_COMMUNITY): Payer: Medicare Other

## 2012-08-27 ENCOUNTER — Encounter (HOSPITAL_COMMUNITY): Payer: Medicare Other

## 2012-08-28 ENCOUNTER — Ambulatory Visit
Admission: RE | Admit: 2012-08-28 | Discharge: 2012-08-28 | Disposition: A | Discharge status: Home / Self Care | Payer: Medicare Other | Source: Ambulatory Visit | Attending: Family Medicine | Admitting: Family Medicine

## 2012-08-28 DIAGNOSIS — I4891 Unspecified atrial fibrillation: Secondary | ICD-10-CM | POA: Diagnosis not present

## 2012-08-28 DIAGNOSIS — Z7901 Long term (current) use of anticoagulants: Secondary | ICD-10-CM | POA: Diagnosis not present

## 2012-08-28 DIAGNOSIS — N6489 Other specified disorders of breast: Secondary | ICD-10-CM

## 2012-08-28 DIAGNOSIS — N6459 Other signs and symptoms in breast: Secondary | ICD-10-CM | POA: Diagnosis not present

## 2012-08-29 ENCOUNTER — Encounter (HOSPITAL_COMMUNITY): Payer: Medicare Other

## 2012-09-03 DIAGNOSIS — B351 Tinea unguium: Secondary | ICD-10-CM | POA: Diagnosis not present

## 2012-09-03 DIAGNOSIS — M79609 Pain in unspecified limb: Secondary | ICD-10-CM | POA: Diagnosis not present

## 2012-09-15 DIAGNOSIS — E039 Hypothyroidism, unspecified: Secondary | ICD-10-CM | POA: Diagnosis not present

## 2012-09-15 DIAGNOSIS — L989 Disorder of the skin and subcutaneous tissue, unspecified: Secondary | ICD-10-CM | POA: Diagnosis not present

## 2012-09-15 DIAGNOSIS — Z23 Encounter for immunization: Secondary | ICD-10-CM | POA: Diagnosis not present

## 2012-09-18 DIAGNOSIS — I4891 Unspecified atrial fibrillation: Secondary | ICD-10-CM | POA: Diagnosis not present

## 2012-09-18 DIAGNOSIS — Z792 Long term (current) use of antibiotics: Secondary | ICD-10-CM | POA: Diagnosis not present

## 2012-09-23 DIAGNOSIS — M899 Disorder of bone, unspecified: Secondary | ICD-10-CM | POA: Diagnosis not present

## 2012-09-23 DIAGNOSIS — Z01419 Encounter for gynecological examination (general) (routine) without abnormal findings: Secondary | ICD-10-CM | POA: Diagnosis not present

## 2012-09-23 DIAGNOSIS — M810 Age-related osteoporosis without current pathological fracture: Secondary | ICD-10-CM | POA: Diagnosis not present

## 2012-10-09 DIAGNOSIS — I4891 Unspecified atrial fibrillation: Secondary | ICD-10-CM | POA: Diagnosis not present

## 2012-10-09 DIAGNOSIS — Z7901 Long term (current) use of anticoagulants: Secondary | ICD-10-CM | POA: Diagnosis not present

## 2012-10-20 DIAGNOSIS — I4891 Unspecified atrial fibrillation: Secondary | ICD-10-CM | POA: Diagnosis not present

## 2012-10-20 DIAGNOSIS — Z7901 Long term (current) use of anticoagulants: Secondary | ICD-10-CM | POA: Diagnosis not present

## 2012-10-23 ENCOUNTER — Other Ambulatory Visit: Payer: Self-pay | Admitting: Dermatology

## 2012-10-23 DIAGNOSIS — D485 Neoplasm of uncertain behavior of skin: Secondary | ICD-10-CM | POA: Diagnosis not present

## 2012-10-23 DIAGNOSIS — C44721 Squamous cell carcinoma of skin of unspecified lower limb, including hip: Secondary | ICD-10-CM | POA: Diagnosis not present

## 2012-10-27 DIAGNOSIS — Z7901 Long term (current) use of anticoagulants: Secondary | ICD-10-CM | POA: Diagnosis not present

## 2012-10-27 DIAGNOSIS — I4891 Unspecified atrial fibrillation: Secondary | ICD-10-CM | POA: Diagnosis not present

## 2012-11-06 DIAGNOSIS — Z7901 Long term (current) use of anticoagulants: Secondary | ICD-10-CM | POA: Diagnosis not present

## 2012-11-06 DIAGNOSIS — I4891 Unspecified atrial fibrillation: Secondary | ICD-10-CM | POA: Diagnosis not present

## 2012-11-14 DIAGNOSIS — B351 Tinea unguium: Secondary | ICD-10-CM | POA: Diagnosis not present

## 2012-11-14 DIAGNOSIS — M79609 Pain in unspecified limb: Secondary | ICD-10-CM | POA: Diagnosis not present

## 2012-11-17 DIAGNOSIS — Z7901 Long term (current) use of anticoagulants: Secondary | ICD-10-CM | POA: Diagnosis not present

## 2012-12-01 DIAGNOSIS — I4891 Unspecified atrial fibrillation: Secondary | ICD-10-CM | POA: Diagnosis not present

## 2012-12-01 DIAGNOSIS — Z7901 Long term (current) use of anticoagulants: Secondary | ICD-10-CM | POA: Diagnosis not present

## 2012-12-04 ENCOUNTER — Other Ambulatory Visit: Payer: Self-pay | Admitting: Dermatology

## 2012-12-04 DIAGNOSIS — Z85828 Personal history of other malignant neoplasm of skin: Secondary | ICD-10-CM | POA: Diagnosis not present

## 2012-12-04 DIAGNOSIS — D0460 Carcinoma in situ of skin of unspecified upper limb, including shoulder: Secondary | ICD-10-CM | POA: Diagnosis not present

## 2012-12-04 DIAGNOSIS — D485 Neoplasm of uncertain behavior of skin: Secondary | ICD-10-CM | POA: Diagnosis not present

## 2012-12-15 DIAGNOSIS — Z7901 Long term (current) use of anticoagulants: Secondary | ICD-10-CM | POA: Diagnosis not present

## 2012-12-15 DIAGNOSIS — I4891 Unspecified atrial fibrillation: Secondary | ICD-10-CM | POA: Diagnosis not present

## 2012-12-23 DIAGNOSIS — B351 Tinea unguium: Secondary | ICD-10-CM | POA: Diagnosis not present

## 2012-12-23 DIAGNOSIS — M2010 Hallux valgus (acquired), unspecified foot: Secondary | ICD-10-CM | POA: Diagnosis not present

## 2012-12-23 DIAGNOSIS — L851 Acquired keratosis [keratoderma] palmaris et plantaris: Secondary | ICD-10-CM | POA: Diagnosis not present

## 2012-12-23 DIAGNOSIS — M2040 Other hammer toe(s) (acquired), unspecified foot: Secondary | ICD-10-CM | POA: Diagnosis not present

## 2012-12-29 DIAGNOSIS — Z7901 Long term (current) use of anticoagulants: Secondary | ICD-10-CM | POA: Diagnosis not present

## 2012-12-29 DIAGNOSIS — I4891 Unspecified atrial fibrillation: Secondary | ICD-10-CM | POA: Diagnosis not present

## 2013-01-05 DIAGNOSIS — F411 Generalized anxiety disorder: Secondary | ICD-10-CM | POA: Diagnosis not present

## 2013-01-05 DIAGNOSIS — J40 Bronchitis, not specified as acute or chronic: Secondary | ICD-10-CM | POA: Diagnosis not present

## 2013-01-05 DIAGNOSIS — E782 Mixed hyperlipidemia: Secondary | ICD-10-CM | POA: Diagnosis not present

## 2013-01-07 DIAGNOSIS — N390 Urinary tract infection, site not specified: Secondary | ICD-10-CM | POA: Diagnosis not present

## 2013-01-07 DIAGNOSIS — Z7901 Long term (current) use of anticoagulants: Secondary | ICD-10-CM | POA: Diagnosis not present

## 2013-01-07 DIAGNOSIS — N23 Unspecified renal colic: Secondary | ICD-10-CM | POA: Diagnosis not present

## 2013-01-07 DIAGNOSIS — R42 Dizziness and giddiness: Secondary | ICD-10-CM | POA: Diagnosis not present

## 2013-01-07 DIAGNOSIS — N898 Other specified noninflammatory disorders of vagina: Secondary | ICD-10-CM | POA: Diagnosis not present

## 2013-01-12 DIAGNOSIS — Z7901 Long term (current) use of anticoagulants: Secondary | ICD-10-CM | POA: Diagnosis not present

## 2013-01-12 DIAGNOSIS — I4891 Unspecified atrial fibrillation: Secondary | ICD-10-CM | POA: Diagnosis not present

## 2013-01-19 DIAGNOSIS — Z7901 Long term (current) use of anticoagulants: Secondary | ICD-10-CM | POA: Diagnosis not present

## 2013-01-19 DIAGNOSIS — I4891 Unspecified atrial fibrillation: Secondary | ICD-10-CM | POA: Diagnosis not present

## 2013-01-26 DIAGNOSIS — Z7901 Long term (current) use of anticoagulants: Secondary | ICD-10-CM | POA: Diagnosis not present

## 2013-01-26 DIAGNOSIS — I4891 Unspecified atrial fibrillation: Secondary | ICD-10-CM | POA: Diagnosis not present

## 2013-02-09 DIAGNOSIS — Z7901 Long term (current) use of anticoagulants: Secondary | ICD-10-CM | POA: Diagnosis not present

## 2013-02-19 DIAGNOSIS — Z7901 Long term (current) use of anticoagulants: Secondary | ICD-10-CM | POA: Diagnosis not present

## 2013-02-19 DIAGNOSIS — I4891 Unspecified atrial fibrillation: Secondary | ICD-10-CM | POA: Diagnosis not present

## 2013-02-26 DIAGNOSIS — I4891 Unspecified atrial fibrillation: Secondary | ICD-10-CM | POA: Diagnosis not present

## 2013-02-26 DIAGNOSIS — Z7901 Long term (current) use of anticoagulants: Secondary | ICD-10-CM | POA: Diagnosis not present

## 2013-03-04 DIAGNOSIS — H353190 Nonexudative age-related macular degeneration, unspecified eye, stage unspecified: Secondary | ICD-10-CM | POA: Diagnosis not present

## 2013-03-08 DIAGNOSIS — R42 Dizziness and giddiness: Secondary | ICD-10-CM | POA: Diagnosis not present

## 2013-03-08 DIAGNOSIS — I10 Essential (primary) hypertension: Secondary | ICD-10-CM | POA: Diagnosis not present

## 2013-03-08 DIAGNOSIS — F411 Generalized anxiety disorder: Secondary | ICD-10-CM | POA: Diagnosis not present

## 2013-03-09 DIAGNOSIS — I4891 Unspecified atrial fibrillation: Secondary | ICD-10-CM | POA: Diagnosis not present

## 2013-03-09 DIAGNOSIS — Z7901 Long term (current) use of anticoagulants: Secondary | ICD-10-CM | POA: Diagnosis not present

## 2013-03-10 DIAGNOSIS — I359 Nonrheumatic aortic valve disorder, unspecified: Secondary | ICD-10-CM | POA: Diagnosis not present

## 2013-03-10 DIAGNOSIS — Z954 Presence of other heart-valve replacement: Secondary | ICD-10-CM | POA: Diagnosis not present

## 2013-03-10 DIAGNOSIS — E785 Hyperlipidemia, unspecified: Secondary | ICD-10-CM | POA: Diagnosis not present

## 2013-03-10 DIAGNOSIS — Z7901 Long term (current) use of anticoagulants: Secondary | ICD-10-CM | POA: Diagnosis not present

## 2013-03-10 DIAGNOSIS — I10 Essential (primary) hypertension: Secondary | ICD-10-CM | POA: Diagnosis not present

## 2013-03-10 DIAGNOSIS — I4891 Unspecified atrial fibrillation: Secondary | ICD-10-CM | POA: Diagnosis not present

## 2013-03-10 DIAGNOSIS — I2510 Atherosclerotic heart disease of native coronary artery without angina pectoris: Secondary | ICD-10-CM | POA: Diagnosis not present

## 2013-03-10 DIAGNOSIS — Z951 Presence of aortocoronary bypass graft: Secondary | ICD-10-CM | POA: Diagnosis not present

## 2013-03-11 DIAGNOSIS — B351 Tinea unguium: Secondary | ICD-10-CM | POA: Diagnosis not present

## 2013-03-11 DIAGNOSIS — M79609 Pain in unspecified limb: Secondary | ICD-10-CM | POA: Diagnosis not present

## 2013-03-23 DIAGNOSIS — Z7901 Long term (current) use of anticoagulants: Secondary | ICD-10-CM | POA: Diagnosis not present

## 2013-04-02 DIAGNOSIS — Z7901 Long term (current) use of anticoagulants: Secondary | ICD-10-CM | POA: Diagnosis not present

## 2013-04-02 DIAGNOSIS — I4891 Unspecified atrial fibrillation: Secondary | ICD-10-CM | POA: Diagnosis not present

## 2013-04-08 DIAGNOSIS — Z79899 Other long term (current) drug therapy: Secondary | ICD-10-CM | POA: Diagnosis not present

## 2013-04-08 DIAGNOSIS — Z7901 Long term (current) use of anticoagulants: Secondary | ICD-10-CM | POA: Diagnosis not present

## 2013-04-08 DIAGNOSIS — R209 Unspecified disturbances of skin sensation: Secondary | ICD-10-CM | POA: Diagnosis not present

## 2013-04-08 DIAGNOSIS — E039 Hypothyroidism, unspecified: Secondary | ICD-10-CM | POA: Diagnosis not present

## 2013-04-08 DIAGNOSIS — E782 Mixed hyperlipidemia: Secondary | ICD-10-CM | POA: Diagnosis not present

## 2013-04-08 DIAGNOSIS — R350 Frequency of micturition: Secondary | ICD-10-CM | POA: Diagnosis not present

## 2013-04-16 DIAGNOSIS — H019 Unspecified inflammation of eyelid: Secondary | ICD-10-CM | POA: Diagnosis not present

## 2013-04-16 DIAGNOSIS — E039 Hypothyroidism, unspecified: Secondary | ICD-10-CM | POA: Diagnosis not present

## 2013-04-16 DIAGNOSIS — Z7901 Long term (current) use of anticoagulants: Secondary | ICD-10-CM | POA: Diagnosis not present

## 2013-04-16 DIAGNOSIS — E782 Mixed hyperlipidemia: Secondary | ICD-10-CM | POA: Diagnosis not present

## 2013-04-16 DIAGNOSIS — Z79899 Other long term (current) drug therapy: Secondary | ICD-10-CM | POA: Diagnosis not present

## 2013-04-27 DIAGNOSIS — E871 Hypo-osmolality and hyponatremia: Secondary | ICD-10-CM | POA: Diagnosis not present

## 2013-04-27 DIAGNOSIS — Z7901 Long term (current) use of anticoagulants: Secondary | ICD-10-CM | POA: Diagnosis not present

## 2013-05-11 DIAGNOSIS — Z7901 Long term (current) use of anticoagulants: Secondary | ICD-10-CM | POA: Diagnosis not present

## 2013-05-20 DIAGNOSIS — M79609 Pain in unspecified limb: Secondary | ICD-10-CM | POA: Diagnosis not present

## 2013-05-20 DIAGNOSIS — B351 Tinea unguium: Secondary | ICD-10-CM | POA: Diagnosis not present

## 2013-05-20 DIAGNOSIS — Z7901 Long term (current) use of anticoagulants: Secondary | ICD-10-CM | POA: Diagnosis not present

## 2013-05-25 DIAGNOSIS — Z7901 Long term (current) use of anticoagulants: Secondary | ICD-10-CM | POA: Diagnosis not present

## 2013-05-30 DIAGNOSIS — M961 Postlaminectomy syndrome, not elsewhere classified: Secondary | ICD-10-CM | POA: Diagnosis not present

## 2013-05-30 DIAGNOSIS — M48061 Spinal stenosis, lumbar region without neurogenic claudication: Secondary | ICD-10-CM | POA: Diagnosis not present

## 2013-06-05 DIAGNOSIS — Z7901 Long term (current) use of anticoagulants: Secondary | ICD-10-CM | POA: Diagnosis not present

## 2013-06-09 ENCOUNTER — Other Ambulatory Visit: Payer: Self-pay | Admitting: Dermatology

## 2013-06-09 DIAGNOSIS — D485 Neoplasm of uncertain behavior of skin: Secondary | ICD-10-CM | POA: Diagnosis not present

## 2013-06-09 DIAGNOSIS — Z85828 Personal history of other malignant neoplasm of skin: Secondary | ICD-10-CM | POA: Diagnosis not present

## 2013-06-09 DIAGNOSIS — L570 Actinic keratosis: Secondary | ICD-10-CM | POA: Diagnosis not present

## 2013-06-09 DIAGNOSIS — C44621 Squamous cell carcinoma of skin of unspecified upper limb, including shoulder: Secondary | ICD-10-CM | POA: Diagnosis not present

## 2013-06-12 DIAGNOSIS — Z79899 Other long term (current) drug therapy: Secondary | ICD-10-CM | POA: Diagnosis not present

## 2013-06-12 DIAGNOSIS — Z7901 Long term (current) use of anticoagulants: Secondary | ICD-10-CM | POA: Diagnosis not present

## 2013-06-19 DIAGNOSIS — Z7901 Long term (current) use of anticoagulants: Secondary | ICD-10-CM | POA: Diagnosis not present

## 2013-07-01 DIAGNOSIS — Z7901 Long term (current) use of anticoagulants: Secondary | ICD-10-CM | POA: Diagnosis not present

## 2013-07-01 DIAGNOSIS — IMO0002 Reserved for concepts with insufficient information to code with codable children: Secondary | ICD-10-CM | POA: Diagnosis not present

## 2013-07-14 ENCOUNTER — Ambulatory Visit (INDEPENDENT_AMBULATORY_CARE_PROVIDER_SITE_OTHER): Payer: Medicare Other | Admitting: Pharmacist Clinician (PhC)/ Clinical Pharmacy Specialist

## 2013-07-14 DIAGNOSIS — Z7901 Long term (current) use of anticoagulants: Secondary | ICD-10-CM | POA: Insufficient documentation

## 2013-07-14 DIAGNOSIS — I4891 Unspecified atrial fibrillation: Secondary | ICD-10-CM

## 2013-07-14 DIAGNOSIS — M545 Low back pain, unspecified: Secondary | ICD-10-CM | POA: Diagnosis not present

## 2013-07-14 HISTORY — DX: Long term (current) use of anticoagulants: Z79.01

## 2013-07-14 LAB — POCT INR: INR: 1.1

## 2013-07-27 DIAGNOSIS — Z7901 Long term (current) use of anticoagulants: Secondary | ICD-10-CM | POA: Diagnosis not present

## 2013-07-27 DIAGNOSIS — E871 Hypo-osmolality and hyponatremia: Secondary | ICD-10-CM | POA: Diagnosis not present

## 2013-07-27 DIAGNOSIS — J069 Acute upper respiratory infection, unspecified: Secondary | ICD-10-CM | POA: Diagnosis not present

## 2013-07-27 DIAGNOSIS — M79609 Pain in unspecified limb: Secondary | ICD-10-CM | POA: Diagnosis not present

## 2013-08-06 DIAGNOSIS — M48061 Spinal stenosis, lumbar region without neurogenic claudication: Secondary | ICD-10-CM | POA: Diagnosis not present

## 2013-08-06 DIAGNOSIS — M961 Postlaminectomy syndrome, not elsewhere classified: Secondary | ICD-10-CM | POA: Diagnosis not present

## 2013-08-10 DIAGNOSIS — Z7901 Long term (current) use of anticoagulants: Secondary | ICD-10-CM | POA: Diagnosis not present

## 2013-08-13 ENCOUNTER — Other Ambulatory Visit: Payer: Self-pay | Admitting: Family Medicine

## 2013-08-13 DIAGNOSIS — M79605 Pain in left leg: Secondary | ICD-10-CM

## 2013-08-17 ENCOUNTER — Other Ambulatory Visit: Payer: Self-pay | Admitting: Family Medicine

## 2013-08-17 DIAGNOSIS — M79605 Pain in left leg: Secondary | ICD-10-CM

## 2013-08-18 ENCOUNTER — Ambulatory Visit
Admission: RE | Admit: 2013-08-18 | Discharge: 2013-08-18 | Disposition: A | Discharge status: Home / Self Care | Payer: Medicare Other | Source: Ambulatory Visit | Attending: Family Medicine | Admitting: Family Medicine

## 2013-08-18 DIAGNOSIS — M79605 Pain in left leg: Secondary | ICD-10-CM

## 2013-08-18 DIAGNOSIS — M48061 Spinal stenosis, lumbar region without neurogenic claudication: Secondary | ICD-10-CM | POA: Diagnosis not present

## 2013-08-18 MED ORDER — IOHEXOL 300 MG/ML  SOLN
100.0000 mL | Freq: Once | INTRAMUSCULAR | Status: AC | PRN
  Administered 2013-08-18: 100 mL via INTRAVENOUS

## 2013-08-27 DIAGNOSIS — M4120 Other idiopathic scoliosis, site unspecified: Secondary | ICD-10-CM | POA: Diagnosis not present

## 2013-09-04 ENCOUNTER — Other Ambulatory Visit: Payer: Self-pay

## 2013-09-04 DIAGNOSIS — Z1231 Encounter for screening mammogram for malignant neoplasm of breast: Secondary | ICD-10-CM

## 2013-09-11 DIAGNOSIS — Z7901 Long term (current) use of anticoagulants: Secondary | ICD-10-CM | POA: Diagnosis not present

## 2013-09-14 ENCOUNTER — Other Ambulatory Visit: Payer: Self-pay | Admitting: Family Medicine

## 2013-09-14 DIAGNOSIS — M8580 Other specified disorders of bone density and structure, unspecified site: Secondary | ICD-10-CM

## 2013-09-16 ENCOUNTER — Ambulatory Visit (INDEPENDENT_AMBULATORY_CARE_PROVIDER_SITE_OTHER): Payer: Medicare Other | Admitting: Pharmacist Clinician (PhC)/ Clinical Pharmacy Specialist

## 2013-09-16 DIAGNOSIS — Z7901 Long term (current) use of anticoagulants: Secondary | ICD-10-CM

## 2013-09-16 DIAGNOSIS — I4891 Unspecified atrial fibrillation: Secondary | ICD-10-CM

## 2013-09-16 DIAGNOSIS — M961 Postlaminectomy syndrome, not elsewhere classified: Secondary | ICD-10-CM | POA: Diagnosis not present

## 2013-09-16 LAB — POCT INR: INR: 1

## 2013-09-30 DIAGNOSIS — M961 Postlaminectomy syndrome, not elsewhere classified: Secondary | ICD-10-CM | POA: Diagnosis not present

## 2013-10-01 DIAGNOSIS — I4891 Unspecified atrial fibrillation: Secondary | ICD-10-CM | POA: Diagnosis not present

## 2013-10-01 DIAGNOSIS — Z7901 Long term (current) use of anticoagulants: Secondary | ICD-10-CM | POA: Diagnosis not present

## 2013-10-01 DIAGNOSIS — Z79899 Other long term (current) drug therapy: Secondary | ICD-10-CM | POA: Diagnosis not present

## 2013-10-08 ENCOUNTER — Ambulatory Visit: Payer: Medicare Other

## 2013-10-20 IMAGING — CR DG CHEST 1V PORT
1 series · 1 of 1 positions shown · non-contrast
Comparison: 12/20/2011

CLINICAL DATA: Postop CABG and valve replacement

PORTABLE CHEST - 1 VIEW

[view not recorded]
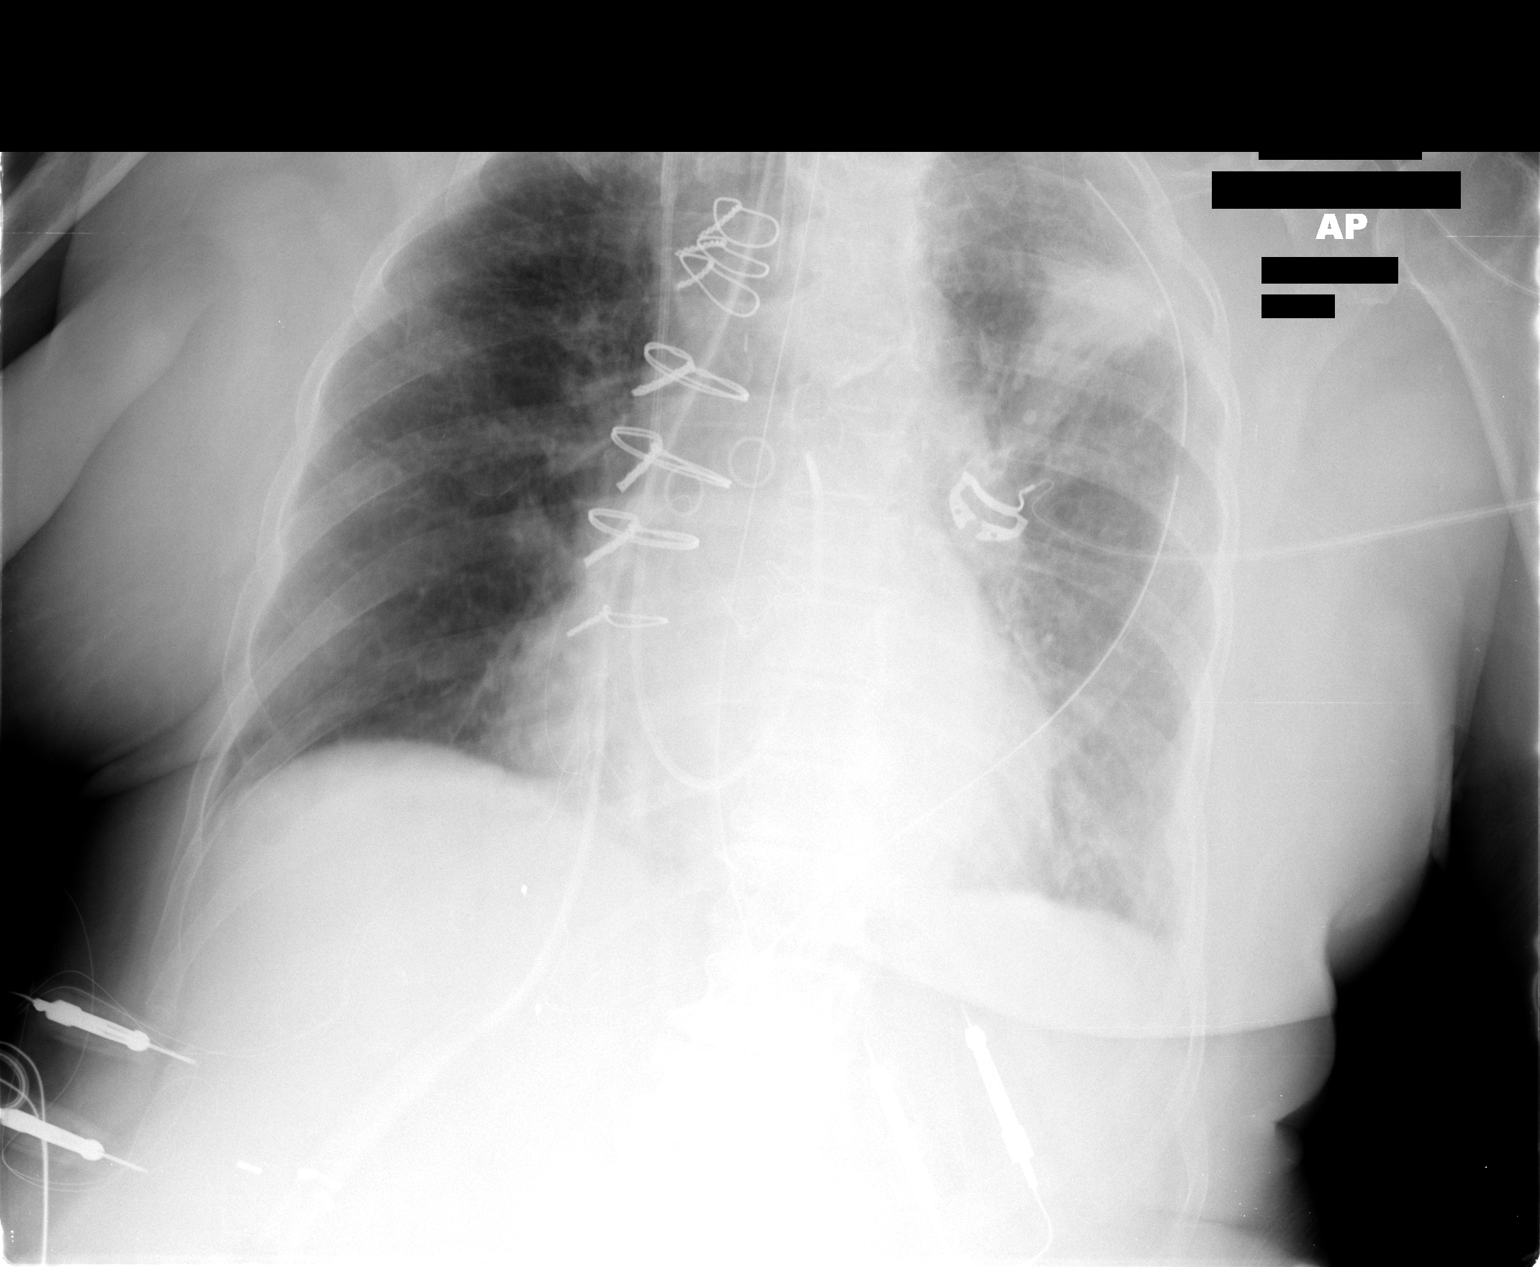

[1 of 1 positions shown; findings below may reference images not displayed]

FINDINGS: There is a Swan-Ganz catheter with tip in the main
pulmonary artery.

The ET tube tip is above the carina.

Bilateral chest tubes are noted without evidence for pneumothorax.

Increased in the left upper lobe opacity which now measures
approximately 4.5 cm.
IMPRESSION: 1.  Postoperative changes compatible with CABG procedure.
2.  Bilateral chest tubes without pneumothorax.
3.  Increase in left upper lobe opacity which may reflect
postoperative change.

## 2013-10-22 DIAGNOSIS — E039 Hypothyroidism, unspecified: Secondary | ICD-10-CM | POA: Diagnosis not present

## 2013-10-22 DIAGNOSIS — I4891 Unspecified atrial fibrillation: Secondary | ICD-10-CM | POA: Diagnosis not present

## 2013-10-22 DIAGNOSIS — Z79899 Other long term (current) drug therapy: Secondary | ICD-10-CM | POA: Diagnosis not present

## 2013-10-22 DIAGNOSIS — Z7901 Long term (current) use of anticoagulants: Secondary | ICD-10-CM | POA: Diagnosis not present

## 2013-10-22 DIAGNOSIS — E782 Mixed hyperlipidemia: Secondary | ICD-10-CM | POA: Diagnosis not present

## 2013-10-28 ENCOUNTER — Ambulatory Visit
Admission: RE | Admit: 2013-10-28 | Discharge: 2013-10-28 | Disposition: A | Discharge status: Home / Self Care | Payer: Medicare Other | Source: Ambulatory Visit

## 2013-10-28 ENCOUNTER — Ambulatory Visit
Admission: RE | Admit: 2013-10-28 | Discharge: 2013-10-28 | Disposition: A | Discharge status: Home / Self Care | Payer: Medicare Other | Source: Ambulatory Visit | Attending: Family Medicine | Admitting: Family Medicine

## 2013-10-28 DIAGNOSIS — Z1231 Encounter for screening mammogram for malignant neoplasm of breast: Secondary | ICD-10-CM

## 2013-10-28 DIAGNOSIS — M8580 Other specified disorders of bone density and structure, unspecified site: Secondary | ICD-10-CM

## 2013-10-30 DIAGNOSIS — I4891 Unspecified atrial fibrillation: Secondary | ICD-10-CM | POA: Diagnosis not present

## 2013-10-30 DIAGNOSIS — Z7901 Long term (current) use of anticoagulants: Secondary | ICD-10-CM | POA: Diagnosis not present

## 2013-11-18 DIAGNOSIS — I4891 Unspecified atrial fibrillation: Secondary | ICD-10-CM | POA: Diagnosis not present

## 2013-11-18 DIAGNOSIS — Z79899 Other long term (current) drug therapy: Secondary | ICD-10-CM | POA: Diagnosis not present

## 2013-11-26 DIAGNOSIS — M79609 Pain in unspecified limb: Secondary | ICD-10-CM | POA: Diagnosis not present

## 2013-11-26 DIAGNOSIS — B351 Tinea unguium: Secondary | ICD-10-CM | POA: Diagnosis not present

## 2013-11-30 DIAGNOSIS — Z7901 Long term (current) use of anticoagulants: Secondary | ICD-10-CM | POA: Diagnosis not present

## 2013-11-30 DIAGNOSIS — I4891 Unspecified atrial fibrillation: Secondary | ICD-10-CM | POA: Diagnosis not present

## 2013-12-01 DIAGNOSIS — I4891 Unspecified atrial fibrillation: Secondary | ICD-10-CM | POA: Diagnosis not present

## 2013-12-01 DIAGNOSIS — Z951 Presence of aortocoronary bypass graft: Secondary | ICD-10-CM | POA: Diagnosis not present

## 2013-12-01 DIAGNOSIS — E785 Hyperlipidemia, unspecified: Secondary | ICD-10-CM | POA: Diagnosis not present

## 2013-12-01 DIAGNOSIS — I10 Essential (primary) hypertension: Secondary | ICD-10-CM | POA: Diagnosis not present

## 2013-12-01 DIAGNOSIS — Z7901 Long term (current) use of anticoagulants: Secondary | ICD-10-CM | POA: Diagnosis not present

## 2013-12-01 DIAGNOSIS — I2510 Atherosclerotic heart disease of native coronary artery without angina pectoris: Secondary | ICD-10-CM | POA: Diagnosis not present

## 2013-12-01 DIAGNOSIS — I359 Nonrheumatic aortic valve disorder, unspecified: Secondary | ICD-10-CM | POA: Diagnosis not present

## 2013-12-01 DIAGNOSIS — Z954 Presence of other heart-valve replacement: Secondary | ICD-10-CM | POA: Diagnosis not present

## 2013-12-08 ENCOUNTER — Other Ambulatory Visit: Payer: Self-pay | Admitting: Dermatology

## 2013-12-08 DIAGNOSIS — L570 Actinic keratosis: Secondary | ICD-10-CM | POA: Diagnosis not present

## 2013-12-08 DIAGNOSIS — Z85828 Personal history of other malignant neoplasm of skin: Secondary | ICD-10-CM | POA: Diagnosis not present

## 2013-12-08 DIAGNOSIS — D485 Neoplasm of uncertain behavior of skin: Secondary | ICD-10-CM | POA: Diagnosis not present

## 2013-12-10 DIAGNOSIS — Z7901 Long term (current) use of anticoagulants: Secondary | ICD-10-CM | POA: Diagnosis not present

## 2013-12-10 DIAGNOSIS — I4891 Unspecified atrial fibrillation: Secondary | ICD-10-CM | POA: Diagnosis not present

## 2013-12-28 DIAGNOSIS — Z7901 Long term (current) use of anticoagulants: Secondary | ICD-10-CM | POA: Diagnosis not present

## 2013-12-28 DIAGNOSIS — I4891 Unspecified atrial fibrillation: Secondary | ICD-10-CM | POA: Diagnosis not present

## 2014-01-05 DIAGNOSIS — M961 Postlaminectomy syndrome, not elsewhere classified: Secondary | ICD-10-CM | POA: Diagnosis not present

## 2014-01-18 DIAGNOSIS — I4891 Unspecified atrial fibrillation: Secondary | ICD-10-CM | POA: Diagnosis not present

## 2014-01-18 DIAGNOSIS — Z7901 Long term (current) use of anticoagulants: Secondary | ICD-10-CM | POA: Diagnosis not present

## 2014-01-25 DIAGNOSIS — I4891 Unspecified atrial fibrillation: Secondary | ICD-10-CM | POA: Diagnosis not present

## 2014-01-25 DIAGNOSIS — Z7901 Long term (current) use of anticoagulants: Secondary | ICD-10-CM | POA: Diagnosis not present

## 2014-02-04 DIAGNOSIS — M79609 Pain in unspecified limb: Secondary | ICD-10-CM | POA: Diagnosis not present

## 2014-02-04 DIAGNOSIS — B351 Tinea unguium: Secondary | ICD-10-CM | POA: Diagnosis not present

## 2014-02-23 DIAGNOSIS — I4891 Unspecified atrial fibrillation: Secondary | ICD-10-CM | POA: Diagnosis not present

## 2014-02-23 DIAGNOSIS — Z7901 Long term (current) use of anticoagulants: Secondary | ICD-10-CM | POA: Diagnosis not present

## 2014-03-02 DIAGNOSIS — I4891 Unspecified atrial fibrillation: Secondary | ICD-10-CM | POA: Diagnosis not present

## 2014-03-02 DIAGNOSIS — Z7901 Long term (current) use of anticoagulants: Secondary | ICD-10-CM | POA: Diagnosis not present

## 2014-03-17 DIAGNOSIS — Z7901 Long term (current) use of anticoagulants: Secondary | ICD-10-CM | POA: Diagnosis not present

## 2014-03-17 DIAGNOSIS — I4891 Unspecified atrial fibrillation: Secondary | ICD-10-CM | POA: Diagnosis not present

## 2014-03-24 DIAGNOSIS — Z7901 Long term (current) use of anticoagulants: Secondary | ICD-10-CM | POA: Diagnosis not present

## 2014-03-24 DIAGNOSIS — I4891 Unspecified atrial fibrillation: Secondary | ICD-10-CM | POA: Diagnosis not present

## 2014-03-29 DIAGNOSIS — I4891 Unspecified atrial fibrillation: Secondary | ICD-10-CM | POA: Diagnosis not present

## 2014-03-29 DIAGNOSIS — Z7901 Long term (current) use of anticoagulants: Secondary | ICD-10-CM | POA: Diagnosis not present

## 2014-04-01 DIAGNOSIS — I4891 Unspecified atrial fibrillation: Secondary | ICD-10-CM | POA: Diagnosis not present

## 2014-04-01 DIAGNOSIS — Z7901 Long term (current) use of anticoagulants: Secondary | ICD-10-CM | POA: Diagnosis not present

## 2014-04-12 DIAGNOSIS — I4891 Unspecified atrial fibrillation: Secondary | ICD-10-CM | POA: Diagnosis not present

## 2014-04-12 DIAGNOSIS — Z7901 Long term (current) use of anticoagulants: Secondary | ICD-10-CM | POA: Diagnosis not present

## 2014-04-21 DIAGNOSIS — E782 Mixed hyperlipidemia: Secondary | ICD-10-CM | POA: Diagnosis not present

## 2014-04-21 DIAGNOSIS — I2510 Atherosclerotic heart disease of native coronary artery without angina pectoris: Secondary | ICD-10-CM | POA: Diagnosis not present

## 2014-04-21 DIAGNOSIS — I10 Essential (primary) hypertension: Secondary | ICD-10-CM | POA: Diagnosis not present

## 2014-04-21 DIAGNOSIS — I4891 Unspecified atrial fibrillation: Secondary | ICD-10-CM | POA: Diagnosis not present

## 2014-04-21 DIAGNOSIS — Z23 Encounter for immunization: Secondary | ICD-10-CM | POA: Diagnosis not present

## 2014-04-21 DIAGNOSIS — E039 Hypothyroidism, unspecified: Secondary | ICD-10-CM | POA: Diagnosis not present

## 2014-04-21 DIAGNOSIS — Z0000 Encounter for general adult medical examination without abnormal findings: Secondary | ICD-10-CM | POA: Diagnosis not present

## 2014-04-21 DIAGNOSIS — Z7901 Long term (current) use of anticoagulants: Secondary | ICD-10-CM | POA: Diagnosis not present

## 2014-04-21 DIAGNOSIS — F411 Generalized anxiety disorder: Secondary | ICD-10-CM | POA: Diagnosis not present

## 2014-04-26 DIAGNOSIS — H6120 Impacted cerumen, unspecified ear: Secondary | ICD-10-CM | POA: Diagnosis not present

## 2014-04-26 DIAGNOSIS — N390 Urinary tract infection, site not specified: Secondary | ICD-10-CM | POA: Diagnosis not present

## 2014-04-29 DIAGNOSIS — M79609 Pain in unspecified limb: Secondary | ICD-10-CM | POA: Diagnosis not present

## 2014-04-29 DIAGNOSIS — B351 Tinea unguium: Secondary | ICD-10-CM | POA: Diagnosis not present

## 2014-04-29 DIAGNOSIS — I4891 Unspecified atrial fibrillation: Secondary | ICD-10-CM | POA: Diagnosis not present

## 2014-04-29 DIAGNOSIS — Z7901 Long term (current) use of anticoagulants: Secondary | ICD-10-CM | POA: Diagnosis not present

## 2014-05-03 DIAGNOSIS — E871 Hypo-osmolality and hyponatremia: Secondary | ICD-10-CM | POA: Diagnosis not present

## 2014-05-03 DIAGNOSIS — R319 Hematuria, unspecified: Secondary | ICD-10-CM | POA: Diagnosis not present

## 2014-05-13 DIAGNOSIS — Z5181 Encounter for therapeutic drug level monitoring: Secondary | ICD-10-CM | POA: Diagnosis not present

## 2014-05-13 DIAGNOSIS — E871 Hypo-osmolality and hyponatremia: Secondary | ICD-10-CM | POA: Diagnosis not present

## 2014-05-13 DIAGNOSIS — Z7901 Long term (current) use of anticoagulants: Secondary | ICD-10-CM | POA: Diagnosis not present

## 2014-05-31 DIAGNOSIS — Z5181 Encounter for therapeutic drug level monitoring: Secondary | ICD-10-CM | POA: Diagnosis not present

## 2014-05-31 DIAGNOSIS — Z7901 Long term (current) use of anticoagulants: Secondary | ICD-10-CM | POA: Diagnosis not present

## 2014-06-07 DIAGNOSIS — I4891 Unspecified atrial fibrillation: Secondary | ICD-10-CM | POA: Diagnosis not present

## 2014-06-07 DIAGNOSIS — Z794 Long term (current) use of insulin: Secondary | ICD-10-CM | POA: Diagnosis not present

## 2014-06-08 DIAGNOSIS — Z954 Presence of other heart-valve replacement: Secondary | ICD-10-CM | POA: Diagnosis not present

## 2014-06-08 DIAGNOSIS — I10 Essential (primary) hypertension: Secondary | ICD-10-CM | POA: Diagnosis not present

## 2014-06-08 DIAGNOSIS — Z951 Presence of aortocoronary bypass graft: Secondary | ICD-10-CM | POA: Diagnosis not present

## 2014-06-08 DIAGNOSIS — E785 Hyperlipidemia, unspecified: Secondary | ICD-10-CM | POA: Diagnosis not present

## 2014-06-08 DIAGNOSIS — Z7901 Long term (current) use of anticoagulants: Secondary | ICD-10-CM | POA: Diagnosis not present

## 2014-06-08 DIAGNOSIS — I2510 Atherosclerotic heart disease of native coronary artery without angina pectoris: Secondary | ICD-10-CM | POA: Diagnosis not present

## 2014-06-08 DIAGNOSIS — I359 Nonrheumatic aortic valve disorder, unspecified: Secondary | ICD-10-CM | POA: Diagnosis not present

## 2014-06-08 DIAGNOSIS — I4891 Unspecified atrial fibrillation: Secondary | ICD-10-CM | POA: Diagnosis not present

## 2014-06-14 DIAGNOSIS — Z7901 Long term (current) use of anticoagulants: Secondary | ICD-10-CM | POA: Diagnosis not present

## 2014-06-14 DIAGNOSIS — I4891 Unspecified atrial fibrillation: Secondary | ICD-10-CM | POA: Diagnosis not present

## 2014-06-15 DIAGNOSIS — Z85828 Personal history of other malignant neoplasm of skin: Secondary | ICD-10-CM | POA: Diagnosis not present

## 2014-06-15 DIAGNOSIS — L570 Actinic keratosis: Secondary | ICD-10-CM | POA: Diagnosis not present

## 2014-06-15 DIAGNOSIS — L821 Other seborrheic keratosis: Secondary | ICD-10-CM | POA: Diagnosis not present

## 2014-06-21 DIAGNOSIS — Z7901 Long term (current) use of anticoagulants: Secondary | ICD-10-CM | POA: Diagnosis not present

## 2014-06-21 DIAGNOSIS — I4891 Unspecified atrial fibrillation: Secondary | ICD-10-CM | POA: Diagnosis not present

## 2014-06-28 DIAGNOSIS — I4891 Unspecified atrial fibrillation: Secondary | ICD-10-CM | POA: Diagnosis not present

## 2014-06-28 DIAGNOSIS — Z7901 Long term (current) use of anticoagulants: Secondary | ICD-10-CM | POA: Diagnosis not present

## 2014-07-12 DIAGNOSIS — I4891 Unspecified atrial fibrillation: Secondary | ICD-10-CM | POA: Diagnosis not present

## 2014-07-12 DIAGNOSIS — Z7901 Long term (current) use of anticoagulants: Secondary | ICD-10-CM | POA: Diagnosis not present

## 2014-07-16 DIAGNOSIS — H353190 Nonexudative age-related macular degeneration, unspecified eye, stage unspecified: Secondary | ICD-10-CM | POA: Diagnosis not present

## 2014-07-22 DIAGNOSIS — B351 Tinea unguium: Secondary | ICD-10-CM | POA: Diagnosis not present

## 2014-07-22 DIAGNOSIS — M79609 Pain in unspecified limb: Secondary | ICD-10-CM | POA: Diagnosis not present

## 2014-08-09 DIAGNOSIS — I4891 Unspecified atrial fibrillation: Secondary | ICD-10-CM | POA: Diagnosis not present

## 2014-08-09 DIAGNOSIS — Z7901 Long term (current) use of anticoagulants: Secondary | ICD-10-CM | POA: Diagnosis not present

## 2014-09-06 DIAGNOSIS — Z7901 Long term (current) use of anticoagulants: Secondary | ICD-10-CM | POA: Diagnosis not present

## 2014-09-06 DIAGNOSIS — I4891 Unspecified atrial fibrillation: Secondary | ICD-10-CM | POA: Diagnosis not present

## 2014-09-16 DIAGNOSIS — I4891 Unspecified atrial fibrillation: Secondary | ICD-10-CM | POA: Diagnosis not present

## 2014-09-16 DIAGNOSIS — M79609 Pain in unspecified limb: Secondary | ICD-10-CM | POA: Diagnosis not present

## 2014-09-16 DIAGNOSIS — Z7901 Long term (current) use of anticoagulants: Secondary | ICD-10-CM | POA: Diagnosis not present

## 2014-09-16 DIAGNOSIS — B351 Tinea unguium: Secondary | ICD-10-CM | POA: Diagnosis not present

## 2014-09-22 ENCOUNTER — Ambulatory Visit (INDEPENDENT_AMBULATORY_CARE_PROVIDER_SITE_OTHER): Payer: Medicare Other | Admitting: Pharmacist Clinician (PhC)/ Clinical Pharmacy Specialist

## 2014-09-22 DIAGNOSIS — M545 Low back pain, unspecified: Secondary | ICD-10-CM | POA: Diagnosis not present

## 2014-09-22 DIAGNOSIS — I4891 Unspecified atrial fibrillation: Secondary | ICD-10-CM | POA: Diagnosis not present

## 2014-09-22 DIAGNOSIS — Z7901 Long term (current) use of anticoagulants: Secondary | ICD-10-CM

## 2014-09-22 LAB — POCT INR: INR: 1

## 2014-09-29 DIAGNOSIS — Z124 Encounter for screening for malignant neoplasm of cervix: Secondary | ICD-10-CM | POA: Diagnosis not present

## 2014-09-29 DIAGNOSIS — M8588 Other specified disorders of bone density and structure, other site: Secondary | ICD-10-CM | POA: Diagnosis not present

## 2014-09-29 DIAGNOSIS — N959 Unspecified menopausal and perimenopausal disorder: Secondary | ICD-10-CM | POA: Diagnosis not present

## 2014-09-30 DIAGNOSIS — Z7901 Long term (current) use of anticoagulants: Secondary | ICD-10-CM | POA: Diagnosis not present

## 2014-09-30 DIAGNOSIS — I4891 Unspecified atrial fibrillation: Secondary | ICD-10-CM | POA: Diagnosis not present

## 2014-10-05 ENCOUNTER — Other Ambulatory Visit: Payer: Self-pay

## 2014-10-05 DIAGNOSIS — Z1231 Encounter for screening mammogram for malignant neoplasm of breast: Secondary | ICD-10-CM

## 2014-10-09 DIAGNOSIS — Z23 Encounter for immunization: Secondary | ICD-10-CM | POA: Diagnosis not present

## 2014-10-11 DIAGNOSIS — M5136 Other intervertebral disc degeneration, lumbar region: Secondary | ICD-10-CM | POA: Diagnosis not present

## 2014-10-11 DIAGNOSIS — M4126 Other idiopathic scoliosis, lumbar region: Secondary | ICD-10-CM | POA: Diagnosis not present

## 2014-10-11 DIAGNOSIS — M961 Postlaminectomy syndrome, not elsewhere classified: Secondary | ICD-10-CM | POA: Diagnosis not present

## 2014-10-14 DIAGNOSIS — M8580 Other specified disorders of bone density and structure, unspecified site: Secondary | ICD-10-CM | POA: Diagnosis not present

## 2014-10-14 DIAGNOSIS — M961 Postlaminectomy syndrome, not elsewhere classified: Secondary | ICD-10-CM | POA: Diagnosis not present

## 2014-10-21 DIAGNOSIS — I4891 Unspecified atrial fibrillation: Secondary | ICD-10-CM | POA: Diagnosis not present

## 2014-10-21 DIAGNOSIS — Z7901 Long term (current) use of anticoagulants: Secondary | ICD-10-CM | POA: Diagnosis not present

## 2014-11-01 ENCOUNTER — Encounter (INDEPENDENT_AMBULATORY_CARE_PROVIDER_SITE_OTHER): Payer: Self-pay

## 2014-11-01 ENCOUNTER — Ambulatory Visit
Admission: RE | Admit: 2014-11-01 | Discharge: 2014-11-01 | Disposition: A | Discharge status: Home / Self Care | Payer: Medicare Other | Source: Ambulatory Visit

## 2014-11-01 DIAGNOSIS — Z1231 Encounter for screening mammogram for malignant neoplasm of breast: Secondary | ICD-10-CM | POA: Diagnosis not present

## 2014-11-11 ENCOUNTER — Other Ambulatory Visit: Payer: Self-pay | Admitting: Dermatology

## 2014-11-11 DIAGNOSIS — D485 Neoplasm of uncertain behavior of skin: Secondary | ICD-10-CM | POA: Diagnosis not present

## 2014-11-11 DIAGNOSIS — Z7901 Long term (current) use of anticoagulants: Secondary | ICD-10-CM | POA: Diagnosis not present

## 2014-11-11 DIAGNOSIS — I4891 Unspecified atrial fibrillation: Secondary | ICD-10-CM | POA: Diagnosis not present

## 2014-11-11 DIAGNOSIS — C44729 Squamous cell carcinoma of skin of left lower limb, including hip: Secondary | ICD-10-CM | POA: Diagnosis not present

## 2014-11-11 DIAGNOSIS — Z85828 Personal history of other malignant neoplasm of skin: Secondary | ICD-10-CM | POA: Diagnosis not present

## 2014-11-22 DIAGNOSIS — I4891 Unspecified atrial fibrillation: Secondary | ICD-10-CM | POA: Diagnosis not present

## 2014-11-22 DIAGNOSIS — Z7901 Long term (current) use of anticoagulants: Secondary | ICD-10-CM | POA: Diagnosis not present

## 2014-11-23 DIAGNOSIS — B351 Tinea unguium: Secondary | ICD-10-CM | POA: Diagnosis not present

## 2014-11-23 DIAGNOSIS — M79674 Pain in right toe(s): Secondary | ICD-10-CM | POA: Diagnosis not present

## 2014-11-23 DIAGNOSIS — M79675 Pain in left toe(s): Secondary | ICD-10-CM | POA: Diagnosis not present

## 2014-12-02 ENCOUNTER — Encounter (HOSPITAL_COMMUNITY): Payer: Self-pay | Admitting: Cardiology

## 2014-12-06 DIAGNOSIS — I4891 Unspecified atrial fibrillation: Secondary | ICD-10-CM | POA: Diagnosis not present

## 2014-12-06 DIAGNOSIS — Z7901 Long term (current) use of anticoagulants: Secondary | ICD-10-CM | POA: Diagnosis not present

## 2014-12-27 DIAGNOSIS — Z7901 Long term (current) use of anticoagulants: Secondary | ICD-10-CM | POA: Diagnosis not present

## 2014-12-27 DIAGNOSIS — I4891 Unspecified atrial fibrillation: Secondary | ICD-10-CM | POA: Diagnosis not present

## 2014-12-28 ENCOUNTER — Other Ambulatory Visit: Payer: Self-pay | Admitting: Dermatology

## 2014-12-28 DIAGNOSIS — C44729 Squamous cell carcinoma of skin of left lower limb, including hip: Secondary | ICD-10-CM | POA: Diagnosis not present

## 2014-12-28 DIAGNOSIS — Z85828 Personal history of other malignant neoplasm of skin: Secondary | ICD-10-CM | POA: Diagnosis not present

## 2014-12-28 DIAGNOSIS — L570 Actinic keratosis: Secondary | ICD-10-CM | POA: Diagnosis not present

## 2014-12-28 DIAGNOSIS — L821 Other seborrheic keratosis: Secondary | ICD-10-CM | POA: Diagnosis not present

## 2014-12-28 DIAGNOSIS — D485 Neoplasm of uncertain behavior of skin: Secondary | ICD-10-CM | POA: Diagnosis not present

## 2015-01-03 DIAGNOSIS — M79675 Pain in left toe(s): Secondary | ICD-10-CM | POA: Diagnosis not present

## 2015-01-03 DIAGNOSIS — M79674 Pain in right toe(s): Secondary | ICD-10-CM | POA: Diagnosis not present

## 2015-01-03 DIAGNOSIS — B351 Tinea unguium: Secondary | ICD-10-CM | POA: Diagnosis not present

## 2015-01-17 DIAGNOSIS — Z7901 Long term (current) use of anticoagulants: Secondary | ICD-10-CM | POA: Diagnosis not present

## 2015-01-17 DIAGNOSIS — I4891 Unspecified atrial fibrillation: Secondary | ICD-10-CM | POA: Diagnosis not present

## 2015-01-18 ENCOUNTER — Other Ambulatory Visit: Payer: Self-pay | Admitting: Physical Medicine and Rehabilitation

## 2015-01-18 DIAGNOSIS — M961 Postlaminectomy syndrome, not elsewhere classified: Secondary | ICD-10-CM

## 2015-01-31 DIAGNOSIS — Z01812 Encounter for preprocedural laboratory examination: Secondary | ICD-10-CM | POA: Diagnosis not present

## 2015-02-01 DIAGNOSIS — M5136 Other intervertebral disc degeneration, lumbar region: Secondary | ICD-10-CM | POA: Diagnosis not present

## 2015-02-08 DIAGNOSIS — M5136 Other intervertebral disc degeneration, lumbar region: Secondary | ICD-10-CM | POA: Diagnosis not present

## 2015-02-16 DIAGNOSIS — I10 Essential (primary) hypertension: Secondary | ICD-10-CM | POA: Diagnosis not present

## 2015-02-16 DIAGNOSIS — E785 Hyperlipidemia, unspecified: Secondary | ICD-10-CM | POA: Diagnosis not present

## 2015-02-16 DIAGNOSIS — I25810 Atherosclerosis of coronary artery bypass graft(s) without angina pectoris: Secondary | ICD-10-CM | POA: Diagnosis not present

## 2015-02-16 DIAGNOSIS — Z1389 Encounter for screening for other disorder: Secondary | ICD-10-CM | POA: Diagnosis not present

## 2015-02-16 DIAGNOSIS — E039 Hypothyroidism, unspecified: Secondary | ICD-10-CM | POA: Diagnosis not present

## 2015-02-16 DIAGNOSIS — M810 Age-related osteoporosis without current pathological fracture: Secondary | ICD-10-CM | POA: Diagnosis not present

## 2015-02-16 DIAGNOSIS — Z6828 Body mass index (BMI) 28.0-28.9, adult: Secondary | ICD-10-CM | POA: Diagnosis not present

## 2015-02-24 DIAGNOSIS — Z7901 Long term (current) use of anticoagulants: Secondary | ICD-10-CM | POA: Diagnosis not present

## 2015-02-24 DIAGNOSIS — I4891 Unspecified atrial fibrillation: Secondary | ICD-10-CM | POA: Diagnosis not present

## 2015-03-10 DIAGNOSIS — Z7901 Long term (current) use of anticoagulants: Secondary | ICD-10-CM | POA: Diagnosis not present

## 2015-03-10 DIAGNOSIS — I4891 Unspecified atrial fibrillation: Secondary | ICD-10-CM | POA: Diagnosis not present

## 2015-03-17 DIAGNOSIS — M79674 Pain in right toe(s): Secondary | ICD-10-CM | POA: Diagnosis not present

## 2015-03-17 DIAGNOSIS — M79675 Pain in left toe(s): Secondary | ICD-10-CM | POA: Diagnosis not present

## 2015-03-17 DIAGNOSIS — B351 Tinea unguium: Secondary | ICD-10-CM | POA: Diagnosis not present

## 2015-04-07 DIAGNOSIS — Z7901 Long term (current) use of anticoagulants: Secondary | ICD-10-CM | POA: Diagnosis not present

## 2015-04-07 DIAGNOSIS — I4891 Unspecified atrial fibrillation: Secondary | ICD-10-CM | POA: Diagnosis not present

## 2015-04-15 DIAGNOSIS — M5136 Other intervertebral disc degeneration, lumbar region: Secondary | ICD-10-CM | POA: Diagnosis not present

## 2015-04-15 DIAGNOSIS — M5126 Other intervertebral disc displacement, lumbar region: Secondary | ICD-10-CM | POA: Diagnosis not present

## 2015-04-27 DIAGNOSIS — H26492 Other secondary cataract, left eye: Secondary | ICD-10-CM | POA: Diagnosis not present

## 2015-04-27 DIAGNOSIS — H26491 Other secondary cataract, right eye: Secondary | ICD-10-CM | POA: Diagnosis not present

## 2015-04-28 DIAGNOSIS — S81811A Laceration without foreign body, right lower leg, initial encounter: Secondary | ICD-10-CM | POA: Diagnosis not present

## 2015-05-02 DIAGNOSIS — Z7901 Long term (current) use of anticoagulants: Secondary | ICD-10-CM | POA: Diagnosis not present

## 2015-05-02 DIAGNOSIS — I4891 Unspecified atrial fibrillation: Secondary | ICD-10-CM | POA: Diagnosis not present

## 2015-05-04 DIAGNOSIS — I2510 Atherosclerotic heart disease of native coronary artery without angina pectoris: Secondary | ICD-10-CM | POA: Diagnosis not present

## 2015-05-04 DIAGNOSIS — F419 Anxiety disorder, unspecified: Secondary | ICD-10-CM | POA: Diagnosis not present

## 2015-05-04 DIAGNOSIS — Z0000 Encounter for general adult medical examination without abnormal findings: Secondary | ICD-10-CM | POA: Diagnosis not present

## 2015-05-04 DIAGNOSIS — E782 Mixed hyperlipidemia: Secondary | ICD-10-CM | POA: Diagnosis not present

## 2015-05-04 DIAGNOSIS — I10 Essential (primary) hypertension: Secondary | ICD-10-CM | POA: Diagnosis not present

## 2015-05-04 DIAGNOSIS — E039 Hypothyroidism, unspecified: Secondary | ICD-10-CM | POA: Diagnosis not present

## 2015-05-04 DIAGNOSIS — I480 Paroxysmal atrial fibrillation: Secondary | ICD-10-CM | POA: Diagnosis not present

## 2015-05-16 DIAGNOSIS — Z7901 Long term (current) use of anticoagulants: Secondary | ICD-10-CM | POA: Diagnosis not present

## 2015-05-16 DIAGNOSIS — I4891 Unspecified atrial fibrillation: Secondary | ICD-10-CM | POA: Diagnosis not present

## 2015-05-24 DIAGNOSIS — Z961 Presence of intraocular lens: Secondary | ICD-10-CM | POA: Diagnosis not present

## 2015-05-24 DIAGNOSIS — I10 Essential (primary) hypertension: Secondary | ICD-10-CM | POA: Diagnosis not present

## 2015-05-24 DIAGNOSIS — H26491 Other secondary cataract, right eye: Secondary | ICD-10-CM | POA: Diagnosis not present

## 2015-05-24 DIAGNOSIS — H18413 Arcus senilis, bilateral: Secondary | ICD-10-CM | POA: Diagnosis not present

## 2015-05-30 DIAGNOSIS — I4891 Unspecified atrial fibrillation: Secondary | ICD-10-CM | POA: Diagnosis not present

## 2015-05-30 DIAGNOSIS — Z7901 Long term (current) use of anticoagulants: Secondary | ICD-10-CM | POA: Diagnosis not present

## 2015-06-02 DIAGNOSIS — H26491 Other secondary cataract, right eye: Secondary | ICD-10-CM | POA: Diagnosis not present

## 2015-06-10 ENCOUNTER — Encounter: Payer: Self-pay | Admitting: Podiatry

## 2015-06-10 ENCOUNTER — Ambulatory Visit (INDEPENDENT_AMBULATORY_CARE_PROVIDER_SITE_OTHER): Payer: Medicare Other | Admitting: Podiatry

## 2015-06-10 VITALS — BP 98/58 | HR 83 | Resp 16

## 2015-06-10 DIAGNOSIS — M79672 Pain in left foot: Secondary | ICD-10-CM | POA: Diagnosis not present

## 2015-06-10 DIAGNOSIS — B351 Tinea unguium: Secondary | ICD-10-CM

## 2015-06-10 DIAGNOSIS — Q828 Other specified congenital malformations of skin: Secondary | ICD-10-CM

## 2015-06-10 DIAGNOSIS — M79609 Pain in unspecified limb: Secondary | ICD-10-CM

## 2015-06-11 NOTE — Progress Notes (Addendum)
Patient ID: Carol [name], female   DOB: Jul 11, 1928, 81 y.o.   MRN: [id number] Complaint:  Visit Type: Patient returns to my office for continued preventative foot care services. Complaint: Patient states" my nails have grown long and thick and become painful to walk and wear shoes" . She presents for preventative foot care services. No changes to ROS.  She has painful callus on bottom of both feet.  Podiatric Exam: Vascular: dorsalis pedis and posterior tibial pulses are palpable bilateral. Capillary return is immediate. Temperature gradient is WNL. Skin turgor WNL  Sensorium: Normal Semmes Weinstein monofilament test. Normal tactile sensation bilaterally. Nail Exam: Pt has thick disfigured discolored nails with subungual debris noted bilateral entire nail hallux through fifth toenails Ulcer Exam: There is no evidence of ulcer or pre-ulcerative changes or infection. Orthopedic Exam: Muscle tone and strength are WNL. No limitations in general ROM. No crepitus or effusions noted. Foot type and digits show no abnormalities. Bony prominences are unremarkable. Skin:  Porokeratosis  Sub 4th metatarsal head B/L. No infection or ulcers   Diagnosis:  Tinea unguium, Pain in right toe, pain in left toes  Treatment & Plan Procedures and Treatment: Consent by patient was obtained for treatment procedures. The patient understood the discussion of treatment and procedures well. All questions were answered thoroughly reviewed. Debridement of mycotic and hypertrophic toenails, 1 through 5 bilateral and clearing of subungual debris. No ulceration, no infection noted.Debride porokeratosis  Return Visit-Office Procedure: Patient instructed to return to the office for a follow up visit 3 months for continued evaluation and treatment.

## 2015-06-30 DIAGNOSIS — I4891 Unspecified atrial fibrillation: Secondary | ICD-10-CM | POA: Diagnosis not present

## 2015-06-30 DIAGNOSIS — Z7901 Long term (current) use of anticoagulants: Secondary | ICD-10-CM | POA: Diagnosis not present

## 2015-07-11 DIAGNOSIS — I4891 Unspecified atrial fibrillation: Secondary | ICD-10-CM | POA: Diagnosis not present

## 2015-07-11 DIAGNOSIS — Z7901 Long term (current) use of anticoagulants: Secondary | ICD-10-CM | POA: Diagnosis not present

## 2015-07-12 DIAGNOSIS — M25512 Pain in left shoulder: Secondary | ICD-10-CM | POA: Diagnosis not present

## 2015-07-12 DIAGNOSIS — R2681 Unsteadiness on feet: Secondary | ICD-10-CM | POA: Diagnosis not present

## 2015-07-12 DIAGNOSIS — M545 Low back pain: Secondary | ICD-10-CM | POA: Diagnosis not present

## 2015-07-15 DIAGNOSIS — M545 Low back pain: Secondary | ICD-10-CM | POA: Diagnosis not present

## 2015-07-15 DIAGNOSIS — M25512 Pain in left shoulder: Secondary | ICD-10-CM | POA: Diagnosis not present

## 2015-07-15 DIAGNOSIS — R2681 Unsteadiness on feet: Secondary | ICD-10-CM | POA: Diagnosis not present

## 2015-07-18 DIAGNOSIS — R2681 Unsteadiness on feet: Secondary | ICD-10-CM | POA: Diagnosis not present

## 2015-07-18 DIAGNOSIS — M25512 Pain in left shoulder: Secondary | ICD-10-CM | POA: Diagnosis not present

## 2015-07-18 DIAGNOSIS — M545 Low back pain: Secondary | ICD-10-CM | POA: Diagnosis not present

## 2015-07-20 DIAGNOSIS — M545 Low back pain: Secondary | ICD-10-CM | POA: Diagnosis not present

## 2015-07-20 DIAGNOSIS — R2681 Unsteadiness on feet: Secondary | ICD-10-CM | POA: Diagnosis not present

## 2015-07-20 DIAGNOSIS — M25512 Pain in left shoulder: Secondary | ICD-10-CM | POA: Diagnosis not present

## 2015-07-21 DIAGNOSIS — Z7901 Long term (current) use of anticoagulants: Secondary | ICD-10-CM | POA: Diagnosis not present

## 2015-07-21 DIAGNOSIS — I4891 Unspecified atrial fibrillation: Secondary | ICD-10-CM | POA: Diagnosis not present

## 2015-07-25 DIAGNOSIS — M25512 Pain in left shoulder: Secondary | ICD-10-CM | POA: Diagnosis not present

## 2015-07-25 DIAGNOSIS — M6281 Muscle weakness (generalized): Secondary | ICD-10-CM | POA: Diagnosis not present

## 2015-07-25 DIAGNOSIS — R2681 Unsteadiness on feet: Secondary | ICD-10-CM | POA: Diagnosis not present

## 2015-07-25 DIAGNOSIS — M545 Low back pain: Secondary | ICD-10-CM | POA: Diagnosis not present

## 2015-07-27 DIAGNOSIS — L821 Other seborrheic keratosis: Secondary | ICD-10-CM | POA: Diagnosis not present

## 2015-07-27 DIAGNOSIS — Z85828 Personal history of other malignant neoplasm of skin: Secondary | ICD-10-CM | POA: Diagnosis not present

## 2015-07-27 DIAGNOSIS — L570 Actinic keratosis: Secondary | ICD-10-CM | POA: Diagnosis not present

## 2015-07-29 DIAGNOSIS — M6281 Muscle weakness (generalized): Secondary | ICD-10-CM | POA: Diagnosis not present

## 2015-07-29 DIAGNOSIS — R2681 Unsteadiness on feet: Secondary | ICD-10-CM | POA: Diagnosis not present

## 2015-07-29 DIAGNOSIS — M25512 Pain in left shoulder: Secondary | ICD-10-CM | POA: Diagnosis not present

## 2015-07-29 DIAGNOSIS — M545 Low back pain: Secondary | ICD-10-CM | POA: Diagnosis not present

## 2015-08-01 DIAGNOSIS — M6281 Muscle weakness (generalized): Secondary | ICD-10-CM | POA: Diagnosis not present

## 2015-08-01 DIAGNOSIS — M545 Low back pain: Secondary | ICD-10-CM | POA: Diagnosis not present

## 2015-08-01 DIAGNOSIS — M25512 Pain in left shoulder: Secondary | ICD-10-CM | POA: Diagnosis not present

## 2015-08-01 DIAGNOSIS — R2681 Unsteadiness on feet: Secondary | ICD-10-CM | POA: Diagnosis not present

## 2015-08-03 DIAGNOSIS — M25512 Pain in left shoulder: Secondary | ICD-10-CM | POA: Diagnosis not present

## 2015-08-03 DIAGNOSIS — M6281 Muscle weakness (generalized): Secondary | ICD-10-CM | POA: Diagnosis not present

## 2015-08-03 DIAGNOSIS — R2681 Unsteadiness on feet: Secondary | ICD-10-CM | POA: Diagnosis not present

## 2015-08-03 DIAGNOSIS — M545 Low back pain: Secondary | ICD-10-CM | POA: Diagnosis not present

## 2015-08-05 DIAGNOSIS — M6281 Muscle weakness (generalized): Secondary | ICD-10-CM | POA: Diagnosis not present

## 2015-08-05 DIAGNOSIS — M545 Low back pain: Secondary | ICD-10-CM | POA: Diagnosis not present

## 2015-08-05 DIAGNOSIS — M25512 Pain in left shoulder: Secondary | ICD-10-CM | POA: Diagnosis not present

## 2015-08-05 DIAGNOSIS — R2681 Unsteadiness on feet: Secondary | ICD-10-CM | POA: Diagnosis not present

## 2015-08-08 DIAGNOSIS — R2681 Unsteadiness on feet: Secondary | ICD-10-CM | POA: Diagnosis not present

## 2015-08-08 DIAGNOSIS — M545 Low back pain: Secondary | ICD-10-CM | POA: Diagnosis not present

## 2015-08-08 DIAGNOSIS — M6281 Muscle weakness (generalized): Secondary | ICD-10-CM | POA: Diagnosis not present

## 2015-08-08 DIAGNOSIS — M25512 Pain in left shoulder: Secondary | ICD-10-CM | POA: Diagnosis not present

## 2015-08-10 DIAGNOSIS — M545 Low back pain: Secondary | ICD-10-CM | POA: Diagnosis not present

## 2015-08-10 DIAGNOSIS — M25512 Pain in left shoulder: Secondary | ICD-10-CM | POA: Diagnosis not present

## 2015-08-10 DIAGNOSIS — M6281 Muscle weakness (generalized): Secondary | ICD-10-CM | POA: Diagnosis not present

## 2015-08-10 DIAGNOSIS — R2681 Unsteadiness on feet: Secondary | ICD-10-CM | POA: Diagnosis not present

## 2015-08-11 DIAGNOSIS — H903 Sensorineural hearing loss, bilateral: Secondary | ICD-10-CM | POA: Diagnosis not present

## 2015-08-11 DIAGNOSIS — Z7901 Long term (current) use of anticoagulants: Secondary | ICD-10-CM | POA: Diagnosis not present

## 2015-08-11 DIAGNOSIS — I4891 Unspecified atrial fibrillation: Secondary | ICD-10-CM | POA: Diagnosis not present

## 2015-08-12 ENCOUNTER — Ambulatory Visit (INDEPENDENT_AMBULATORY_CARE_PROVIDER_SITE_OTHER): Payer: Medicare Other | Admitting: Podiatry

## 2015-08-12 ENCOUNTER — Encounter: Payer: Self-pay | Admitting: Podiatry

## 2015-08-12 VITALS — BP 114/52 | HR 70 | Resp 16

## 2015-08-12 DIAGNOSIS — Q828 Other specified congenital malformations of skin: Secondary | ICD-10-CM

## 2015-08-12 DIAGNOSIS — M79673 Pain in unspecified foot: Secondary | ICD-10-CM

## 2015-08-12 DIAGNOSIS — B351 Tinea unguium: Secondary | ICD-10-CM

## 2015-08-12 DIAGNOSIS — M79609 Pain in unspecified limb: Secondary | ICD-10-CM

## 2015-08-12 NOTE — Progress Notes (Signed)
Patient ID: Carol [name], female   DOB: 18-Oct-1928, 81 y.o.   MRN: [id number] Complaint:  Visit Type: Patient returns to my office for continued preventative foot care services. Complaint: Patient states" my nails have grown long and thick and become painful to walk and wear shoes" . She presents for preventative foot care services. No changes to ROS.  She has painful callus on bottom of both feet.  Podiatric Exam: Vascular: dorsalis pedis and posterior tibial pulses are palpable bilateral. Capillary return is immediate. Temperature gradient is WNL. Skin turgor WNL  Sensorium: Normal Semmes Weinstein monofilament test. Normal tactile sensation bilaterally. Nail Exam: Pt has thick disfigured discolored nails with subungual debris noted bilateral entire nail hallux through fifth toenails Ulcer Exam: There is no evidence of ulcer or pre-ulcerative changes or infection. Orthopedic Exam: Muscle tone and strength are WNL. No limitations in general ROM. No crepitus or effusions noted. Foot type and digits show no abnormalities. Bony prominences are unremarkable. Skin:  Porokeratosis  Sub 4th metatarsal head B/L. No infection or ulcers   Diagnosis:  Tinea unguium, Pain in right toe, pain in left toes  Treatment & Plan Procedures and Treatment: Consent by patient was obtained for treatment procedures. The patient understood the discussion of treatment and procedures well. All questions were answered thoroughly reviewed. Debridement of mycotic and hypertrophic toenails, 1 through 5 bilateral and clearing of subungual debris. No ulceration, no infection noted.Debride porokeratosis  Return Visit-Office Procedure: Patient instructed to return to the office for a follow up visit 3 months for continued evaluation and treatment.

## 2015-08-15 DIAGNOSIS — M545 Low back pain: Secondary | ICD-10-CM | POA: Diagnosis not present

## 2015-08-15 DIAGNOSIS — R2681 Unsteadiness on feet: Secondary | ICD-10-CM | POA: Diagnosis not present

## 2015-08-15 DIAGNOSIS — M25512 Pain in left shoulder: Secondary | ICD-10-CM | POA: Diagnosis not present

## 2015-08-15 DIAGNOSIS — M6281 Muscle weakness (generalized): Secondary | ICD-10-CM | POA: Diagnosis not present

## 2015-08-18 DIAGNOSIS — M25512 Pain in left shoulder: Secondary | ICD-10-CM | POA: Diagnosis not present

## 2015-08-18 DIAGNOSIS — R2681 Unsteadiness on feet: Secondary | ICD-10-CM | POA: Diagnosis not present

## 2015-08-18 DIAGNOSIS — M6281 Muscle weakness (generalized): Secondary | ICD-10-CM | POA: Diagnosis not present

## 2015-08-18 DIAGNOSIS — M545 Low back pain: Secondary | ICD-10-CM | POA: Diagnosis not present

## 2015-08-19 DIAGNOSIS — H903 Sensorineural hearing loss, bilateral: Secondary | ICD-10-CM | POA: Diagnosis not present

## 2015-08-19 DIAGNOSIS — H6122 Impacted cerumen, left ear: Secondary | ICD-10-CM | POA: Diagnosis not present

## 2015-08-19 DIAGNOSIS — H6062 Unspecified chronic otitis externa, left ear: Secondary | ICD-10-CM | POA: Diagnosis not present

## 2015-08-22 DIAGNOSIS — M6281 Muscle weakness (generalized): Secondary | ICD-10-CM | POA: Diagnosis not present

## 2015-08-22 DIAGNOSIS — M25512 Pain in left shoulder: Secondary | ICD-10-CM | POA: Diagnosis not present

## 2015-08-22 DIAGNOSIS — R2681 Unsteadiness on feet: Secondary | ICD-10-CM | POA: Diagnosis not present

## 2015-08-22 DIAGNOSIS — M545 Low back pain: Secondary | ICD-10-CM | POA: Diagnosis not present

## 2015-08-31 DIAGNOSIS — C44722 Squamous cell carcinoma of skin of right lower limb, including hip: Secondary | ICD-10-CM | POA: Diagnosis not present

## 2015-08-31 DIAGNOSIS — D485 Neoplasm of uncertain behavior of skin: Secondary | ICD-10-CM | POA: Diagnosis not present

## 2015-08-31 DIAGNOSIS — C44729 Squamous cell carcinoma of skin of left lower limb, including hip: Secondary | ICD-10-CM | POA: Diagnosis not present

## 2015-08-31 DIAGNOSIS — Z85828 Personal history of other malignant neoplasm of skin: Secondary | ICD-10-CM | POA: Diagnosis not present

## 2015-09-01 DIAGNOSIS — H903 Sensorineural hearing loss, bilateral: Secondary | ICD-10-CM | POA: Diagnosis not present

## 2015-09-08 DIAGNOSIS — Z7901 Long term (current) use of anticoagulants: Secondary | ICD-10-CM | POA: Diagnosis not present

## 2015-09-08 DIAGNOSIS — I4891 Unspecified atrial fibrillation: Secondary | ICD-10-CM | POA: Diagnosis not present

## 2015-09-19 DIAGNOSIS — E039 Hypothyroidism, unspecified: Secondary | ICD-10-CM | POA: Diagnosis not present

## 2015-09-19 DIAGNOSIS — R5383 Other fatigue: Secondary | ICD-10-CM | POA: Diagnosis not present

## 2015-09-19 DIAGNOSIS — D692 Other nonthrombocytopenic purpura: Secondary | ICD-10-CM | POA: Diagnosis not present

## 2015-09-19 DIAGNOSIS — I10 Essential (primary) hypertension: Secondary | ICD-10-CM | POA: Diagnosis not present

## 2015-09-22 DIAGNOSIS — Z23 Encounter for immunization: Secondary | ICD-10-CM | POA: Diagnosis not present

## 2015-10-06 DIAGNOSIS — I4891 Unspecified atrial fibrillation: Secondary | ICD-10-CM | POA: Diagnosis not present

## 2015-10-06 DIAGNOSIS — Z7901 Long term (current) use of anticoagulants: Secondary | ICD-10-CM | POA: Diagnosis not present

## 2015-10-13 DIAGNOSIS — Z85828 Personal history of other malignant neoplasm of skin: Secondary | ICD-10-CM | POA: Diagnosis not present

## 2015-10-13 DIAGNOSIS — C44722 Squamous cell carcinoma of skin of right lower limb, including hip: Secondary | ICD-10-CM | POA: Diagnosis not present

## 2015-10-14 ENCOUNTER — Ambulatory Visit: Payer: Medicare Other | Admitting: Podiatry

## 2015-10-18 ENCOUNTER — Emergency Department (HOSPITAL_COMMUNITY)
Admission: EM | Admit: 2015-10-18 | Discharge: 2015-10-18 | Disposition: A | Discharge status: Home / Self Care | Payer: Medicare Other | Attending: Emergency Medicine | Admitting: Emergency Medicine

## 2015-10-18 ENCOUNTER — Encounter (HOSPITAL_COMMUNITY): Payer: Self-pay | Admitting: Emergency Medicine

## 2015-10-18 DIAGNOSIS — S81801A Unspecified open wound, right lower leg, initial encounter: Secondary | ICD-10-CM | POA: Insufficient documentation

## 2015-10-18 DIAGNOSIS — I4891 Unspecified atrial fibrillation: Secondary | ICD-10-CM | POA: Insufficient documentation

## 2015-10-18 DIAGNOSIS — Z79899 Other long term (current) drug therapy: Secondary | ICD-10-CM | POA: Insufficient documentation

## 2015-10-18 DIAGNOSIS — Z7982 Long term (current) use of aspirin: Secondary | ICD-10-CM | POA: Insufficient documentation

## 2015-10-18 DIAGNOSIS — Y939 Activity, unspecified: Secondary | ICD-10-CM | POA: Insufficient documentation

## 2015-10-18 DIAGNOSIS — Z950 Presence of cardiac pacemaker: Secondary | ICD-10-CM | POA: Insufficient documentation

## 2015-10-18 DIAGNOSIS — T148 Other injury of unspecified body region: Secondary | ICD-10-CM | POA: Diagnosis not present

## 2015-10-18 DIAGNOSIS — I119 Hypertensive heart disease without heart failure: Secondary | ICD-10-CM | POA: Diagnosis not present

## 2015-10-18 DIAGNOSIS — M542 Cervicalgia: Secondary | ICD-10-CM | POA: Diagnosis not present

## 2015-10-18 DIAGNOSIS — Y999 Unspecified external cause status: Secondary | ICD-10-CM | POA: Diagnosis not present

## 2015-10-18 DIAGNOSIS — Z87891 Personal history of nicotine dependence: Secondary | ICD-10-CM | POA: Insufficient documentation

## 2015-10-18 DIAGNOSIS — E669 Obesity, unspecified: Secondary | ICD-10-CM | POA: Diagnosis not present

## 2015-10-18 DIAGNOSIS — I2510 Atherosclerotic heart disease of native coronary artery without angina pectoris: Secondary | ICD-10-CM | POA: Insufficient documentation

## 2015-10-18 DIAGNOSIS — Y9389 Activity, other specified: Secondary | ICD-10-CM | POA: Insufficient documentation

## 2015-10-18 DIAGNOSIS — Z85828 Personal history of other malignant neoplasm of skin: Secondary | ICD-10-CM | POA: Diagnosis not present

## 2015-10-18 DIAGNOSIS — W19XXXA Unspecified fall, initial encounter: Secondary | ICD-10-CM

## 2015-10-18 DIAGNOSIS — E785 Hyperlipidemia, unspecified: Secondary | ICD-10-CM | POA: Diagnosis not present

## 2015-10-18 DIAGNOSIS — Z7901 Long term (current) use of anticoagulants: Secondary | ICD-10-CM | POA: Insufficient documentation

## 2015-10-18 DIAGNOSIS — E039 Hypothyroidism, unspecified: Secondary | ICD-10-CM | POA: Diagnosis not present

## 2015-10-18 DIAGNOSIS — Y929 Unspecified place or not applicable: Secondary | ICD-10-CM | POA: Diagnosis not present

## 2015-10-18 DIAGNOSIS — Z9889 Other specified postprocedural states: Secondary | ICD-10-CM | POA: Insufficient documentation

## 2015-10-18 DIAGNOSIS — S81811A Laceration without foreign body, right lower leg, initial encounter: Secondary | ICD-10-CM | POA: Diagnosis not present

## 2015-10-18 DIAGNOSIS — W01198A Fall on same level from slipping, tripping and stumbling with subsequent striking against other object, initial encounter: Secondary | ICD-10-CM | POA: Insufficient documentation

## 2015-10-18 DIAGNOSIS — S8991XA Unspecified injury of right lower leg, initial encounter: Secondary | ICD-10-CM | POA: Diagnosis present

## 2015-10-18 LAB — PROTIME-INR
INR: 2.9 — ABNORMAL HIGH (ref 0.00–1.49)
Prothrombin Time: 29.9 seconds — ABNORMAL HIGH (ref 11.6–15.2)

## 2015-10-18 MED ORDER — LIDOCAINE HCL (PF) 1 % IJ SOLN
30.0000 mL | Freq: Once | INTRAMUSCULAR | Status: AC
  Administered 2015-10-18: 30 mL via INTRADERMAL
  Filled 2015-10-18: qty 30

## 2015-10-18 NOTE — ED Notes (Signed)
Pt  Has bandage on right lower leg. BP 162/70, HR 70 for EMS

## 2015-10-18 NOTE — ED Notes (Signed)
Pt states she slipped in the shower. She did not feel dizzy.

## 2015-10-18 NOTE — ED Provider Notes (Signed)
LACERATION REPAIR Performed by: Gloriann Loan Consent: Verbal consent obtained. Risks and benefits: risks, benefits and alternatives were discussed Patient identity confirmed: provided demographic data Time out performed prior to procedure Prepped and Draped in normal sterile fashion Wound explored Laceration Location: right anterior shin Laceration Length: 7 cm No Foreign Bodies seen or palpated Anesthesia: local infiltration Local anesthetic: lidocaine 1% without epinephrine Anesthetic total: 16 ml Irrigation method: syringe Amount of cleaning: standard Skin closure: 4-0 prolene Number of sutures or staples: 8 Technique: simple interrupted Patient tolerance: Patient tolerated the procedure well with no immediate complications.   Gloriann Loan, PA-C 10/18/15 [place], MD 10/18/15 [phone]

## 2015-10-18 NOTE — ED Provider Notes (Signed)
CSN: [id number]     Arrival date & time 10/18/15  1205 History   First MD Initiated Contact with Patient 10/18/15 1211     Chief Complaint  Patient presents with  . Fall     Patient is a 81 y.o. female presenting with fall. The history is provided by the patient. No language interpreter was used.  Fall   Carol [name] presents for evaluation of injuries following a fall. She was getting into the shower and slipped with her left leg and fell onto her right anterior shin. She sustained a laceration to the anterior shin. She denies any head injury or loss of consciousness. She does take Coumadin for atrial fibrillation. She recently had a skin cancer removed from her right lateral leg. She is able to bear weight on the leg and denies any additional injuries. Her tetanus status is unknown.  Past Medical History  Diagnosis Date  . Hyperlipidemia   . Hypothyroidism   . Spinal stenosis   . Lumbar disc disease   . Obesity   . Aortic stenosis   . Dyspnea   . Lung nodules     Sees [facility], thought to be benign  . Hypertensive heart disease without CHF   . Villous adenoma of rectum   . Coronary artery disease     Cath 1/13 left main   Past Surgical History  Procedure Laterality Date  . Partial colectomy  2008    Villous adenoma  . Hernia repair    . Lumbar laminectomy    . Cholecystectomy    . Cardiac catheterization  2013  . Aortic valve replacement  12/28/2011    Procedure: AORTIC VALVE REPLACEMENT (AVR);  Surgeon: Grace Isaac, MD;  Location: [facility];  Service: Open Heart Surgery;  Laterality: N/A;  . Coronary artery bypass graft  12/28/2011    Procedure: CORONARY ARTERY BYPASS GRAFTING (CABG);  Surgeon: Grace Isaac, MD;  Location: [facility];  Service: Open Heart Surgery;  Laterality: N/A;  . Left and right heart catheterization with coronary angiogram N/A 12/26/2011    Procedure: LEFT AND RIGHT HEART CATHETERIZATION WITH CORONARY ANGIOGRAM;  Surgeon: Jacolyn Reedy, MD;  Location: [facility]  CATH LAB;  Service: Cardiovascular;  Laterality: N/A;   Family History  Problem Relation Age of Onset  . Lymphoma Sister   . Cancer Sister   . Cancer Mother   . Heart attack Father   . Heart disease Father    Social History  Substance Use Topics  . Smoking status: Former Smoker -- 0.50 packs/day for 10 years    Types: Cigarettes    Quit date: 12/24/1978  . Smokeless tobacco: Never Used  . Alcohol Use: 4.2 oz/week    7 Glasses of wine per week     Comment: occ   OB History    No data available     Review of Systems  All other systems reviewed and are negative.     Allergies  Ibuprofen and Tape  Home Medications   Prior to Admission medications   Medication Sig Start Date End Date Taking? Authorizing Provider  aspirin EC 81 MG tablet Take 81 mg by mouth daily.      Historical Provider, MD  atorvastatin (LIPITOR) 20 MG tablet Take 20 mg by mouth at bedtime.      Historical Provider, MD  Calcium Carbonate-Vitamin D (CALCIUM 600 + D PO) Take 1 tablet by mouth 2 (two) times daily.      Historical Provider, MD  cholecalciferol (VITAMIN D) 400 UNITS TABS Take 400 Units by mouth daily.      Historical Provider, MD  docusate sodium (COLACE) 250 MG capsule Take 250 mg by mouth 2 (two) times daily. Take one capsule by mouth twice daily    Historical Provider, MD  gabapentin (NEURONTIN) 100 MG capsule TAKE 2 CAPSULES BY MOUTH 5 TIMES A DAY 06/24/15   Historical Provider, MD  hydrochlorothiazide (HYDRODIURIL) 25 MG tablet Take 12.5 mg by mouth daily. Take one-half tablet (12.5 mg) daily    Historical Provider, MD  levothyroxine (SYNTHROID, LEVOTHROID) 88 MCG tablet Take 1 tablet (88 mcg total) by mouth every morning. 01/21/12 01/20/13  Modena Jansky, MD  levothyroxine (SYNTHROID, LEVOTHROID) 88 MCG tablet TAKE 1 TABLET BY MOUTH EVERY MORNING ON AN EMPTY STOMACH 06/28/15   Historical Provider, MD  losartan (COZAAR) 100 MG tablet Take 100 mg by mouth daily. 06/19/15   Historical Provider,  MD  Lutein 20 MG CAPS Take 20 mg by mouth daily.      Historical Provider, MD  metoprolol (LOPRESSOR) 50 MG tablet Take 0.5 tablets (25 mg total) by mouth 2 (two) times daily. 01/21/12 01/20/13  Modena Jansky, MD  Multiple Vitamin (MULITIVITAMIN WITH MINERALS) TABS Take 1 tablet by mouth daily.      Historical Provider, MD  Multiple Vitamins-Minerals (OCUVITE PRESERVISION PO) Take 1 tablet by mouth 2 (two) times daily.      Historical Provider, MD  vitamin C (ASCORBIC ACID) 500 MG tablet Take 500 mg by mouth daily.      Historical Provider, MD  warfarin (COUMADIN) 1 MG tablet TAKE AS DIRECTED (90 DAY SUPPLY) 05/10/15   Historical Provider, MD  warfarin (COUMADIN) 5 MG tablet Take 5 mg by mouth daily. Take one tablet (5 mg) by mouth every Tuesday, Thursday, Saturday, and Sunday. Take one and one-half tablets (7.5 mg) on Monday, Wednesday, and Friday.    Historical Provider, MD   BP 181/68 mmHg  Temp(Src) 97.8 F (36.6 C) (Oral)  Resp 15  Ht '5\' 4"'$  (1.626 m)  Wt 144 lb (65.318 kg)  BMI 24.71 kg/m2  SpO2 96% Physical Exam  Constitutional: She is oriented to person, place, and time. She appears well-developed and well-nourished.  HENT:  Head: Normocephalic and atraumatic.  Cardiovascular: Normal rate.   Pulmonary/Chest: Effort normal. No respiratory distress.  Musculoskeletal:  2+ DP pulses in bilateral lower extremities. Right lateral leg with a healing surgical site with sutures intact, clean and dry. Mid right shin with large linear laceration, approximately 5-7 cm in length. There is an irregular skin tear superior to this area approximately 2-3 cm in length. There is a small U-shaped skin tear to the inferior portion of the shin. No discrete bony tenderness of the right lower extremity  Neurological: She is alert and oriented to person, place, and time.  Skin: Skin is warm and dry.  Psychiatric: She has a normal mood and affect. Her behavior is normal.  Nursing note and vitals  reviewed.   ED Course  Procedures (including critical care time) Labs Review Labs Reviewed - No data to display  Imaging Review No results found. I have personally reviewed and evaluated these images and lab results as part of my medical decision-making.   EKG Interpretation None      MDM   Final diagnoses:  Fall, initial encounter  Laceration of leg, right, initial encounter    Patient here for evaluation of injuries following a mechanical fall. She has a laceration  as well as skin tears to the right anterior leg. There is no evidence of additional injuries on examination. She is able to ambulate the department without difficulty. She does take Coumadin for atrial fibrillation, INR checked and it is therapeutic. Wound repaired per procedure note by Gloriann Loan, PA. Discussed with patient home care for the wound as well as outpatient follow-up and return precautions. The patient is not sure of her tetanus status, but plans to follow-up with her primary care provider to determine if it is up-to-date.  Quintella Reichert, MD 10/19/15 [phone]

## 2015-10-18 NOTE — ED Notes (Signed)
Spoke with RN at [facility] to notify that pt will be returning.

## 2015-10-18 NOTE — ED Notes (Signed)
Cleansed pt's wound and placed steri strips and bacitracin over site. Placed nonadherant dressing over wound and gauze then kerlix. Gave pt supplies to change dressing at home.

## 2015-10-18 NOTE — ED Notes (Signed)
Pt fell getting into shower. Pt fell on hands and knees. Pt's right shin has abrasion from hitting floor. No LOC. Pt is from [facility] independent living. Pt is alert and oriented.

## 2015-10-18 NOTE — Discharge Instructions (Signed)
Get your stitches removed in the next 12-14 days. Your INR was 2.9 today.    Laceration Care, Adult A laceration is a cut that goes through all of the layers of the skin and into the tissue that is right under the skin. Some lacerations heal on their own. Others need to be closed with stitches (sutures), staples, skin adhesive strips, or skin glue. Proper laceration care minimizes the risk of infection and helps the laceration to heal better. HOW TO CARE FOR YOUR LACERATION If sutures or staples were used:  Keep the wound clean and dry.  If you were given a bandage (dressing), you should change it at least one time per day or as told by your health care provider. You should also change it if it becomes wet or dirty.  Keep the wound completely dry for the first 24 hours or as told by your health care provider. After that time, you may shower or bathe. However, make sure that the wound is not soaked in water until after the sutures or staples have been removed.  Clean the wound one time each day or as told by your health care provider:  Wash the wound with soap and water.  Rinse the wound with water to remove all soap.  Pat the wound dry with a clean towel. Do not rub the wound.  After cleaning the wound, apply a thin layer of antibiotic ointmentas told by your health care provider. This will help to prevent infection and keep the dressing from sticking to the wound.  Have the sutures or staples removed as told by your health care provider. If skin adhesive strips were used:  Keep the wound clean and dry.  If you were given a bandage (dressing), you should change it at least one time per day or as told by your health care provider. You should also change it if it becomes dirty or wet.  Do not get the skin adhesive strips wet. You may shower or bathe, but be careful to keep the wound dry.  If the wound gets wet, pat it dry with a clean towel. Do not rub the wound.  Skin adhesive  strips fall off on their own. You may trim the strips as the wound heals. Do not remove skin adhesive strips that are still stuck to the wound. They will fall off in time. If skin glue was used:  Try to keep the wound dry, but you may briefly wet it in the shower or bath. Do not soak the wound in water, such as by swimming.  After you have showered or bathed, gently pat the wound dry with a clean towel. Do not rub the wound.  Do not do any activities that will make you sweat heavily until the skin glue has fallen off on its own.  Do not apply liquid, cream, or ointment medicine to the wound while the skin glue is in place. Using those may loosen the film before the wound has healed.  If you were given a bandage (dressing), you should change it at least one time per day or as told by your health care provider. You should also change it if it becomes dirty or wet.  If a dressing is placed over the wound, be careful not to apply tape directly over the skin glue. Doing that may cause the glue to be pulled off before the wound has healed.  Do not pick at the glue. The skin glue usually remains in place  for 5-10 days, then it falls off of the skin. General Instructions  Take over-the-counter and prescription medicines only as told by your health care provider.  If you were prescribed an antibiotic medicine or ointment, take or apply it as told by your doctor. Do not stop using it even if your condition improves.  To help prevent scarring, make sure to cover your wound with sunscreen whenever you are outside after stitches are removed, after adhesive strips are removed, or when glue remains in place and the wound is healed. Make sure to wear a sunscreen of at least 30 SPF.  Do not scratch or pick at the wound.  Keep all follow-up visits as told by your health care provider. This is important.  Check your wound every day for signs of infection. Watch for:  Redness, swelling, or pain.  Fluid,  blood, or pus.  Raise (elevate) the injured area above the level of your heart while you are sitting or lying down, if possible. SEEK MEDICAL CARE IF:  You received a tetanus shot and you have swelling, severe pain, redness, or bleeding at the injection site.  You have a fever.  A wound that was closed breaks open.  You notice a bad smell coming from your wound or your dressing.  You notice something coming out of the wound, such as wood or glass.  Your pain is not controlled with medicine.  You have increased redness, swelling, or pain at the site of your wound.  You have fluid, blood, or pus coming from your wound.  You notice a change in the color of your skin near your wound.  You need to change the dressing frequently due to fluid, blood, or pus draining from the wound.  You develop a new rash.  You develop numbness around the wound. SEEK IMMEDIATE MEDICAL CARE IF:  You develop severe swelling around the wound.  Your pain suddenly increases and is severe.  You develop painful lumps near the wound or on skin that is anywhere on your body.  You have a red streak going away from your wound.  The wound is on your hand or foot and you cannot properly move a finger or toe.  The wound is on your hand or foot and you notice that your fingers or toes look pale or bluish.   This information is not intended to replace advice given to you by your health care provider. Make sure you discuss any questions you have with your health care provider.   Document Released: 12/10/2005 Document Revised: 04/26/2015 Document Reviewed: 12/06/2014 Elsevier Interactive Patient Education [employer].

## 2015-10-20 DIAGNOSIS — I10 Essential (primary) hypertension: Secondary | ICD-10-CM | POA: Diagnosis not present

## 2015-10-20 DIAGNOSIS — E039 Hypothyroidism, unspecified: Secondary | ICD-10-CM | POA: Diagnosis not present

## 2015-10-20 DIAGNOSIS — I2510 Atherosclerotic heart disease of native coronary artery without angina pectoris: Secondary | ICD-10-CM | POA: Diagnosis not present

## 2015-10-20 DIAGNOSIS — Z952 Presence of prosthetic heart valve: Secondary | ICD-10-CM | POA: Diagnosis not present

## 2015-10-20 DIAGNOSIS — I350 Nonrheumatic aortic (valve) stenosis: Secondary | ICD-10-CM | POA: Diagnosis not present

## 2015-10-20 DIAGNOSIS — Z7901 Long term (current) use of anticoagulants: Secondary | ICD-10-CM | POA: Diagnosis not present

## 2015-10-20 DIAGNOSIS — I480 Paroxysmal atrial fibrillation: Secondary | ICD-10-CM | POA: Diagnosis not present

## 2015-10-20 DIAGNOSIS — E785 Hyperlipidemia, unspecified: Secondary | ICD-10-CM | POA: Diagnosis not present

## 2015-10-20 DIAGNOSIS — Z951 Presence of aortocoronary bypass graft: Secondary | ICD-10-CM | POA: Diagnosis not present

## 2015-10-21 DIAGNOSIS — T148 Other injury of unspecified body region: Secondary | ICD-10-CM | POA: Diagnosis not present

## 2015-10-21 DIAGNOSIS — S81811S Laceration without foreign body, right lower leg, sequela: Secondary | ICD-10-CM | POA: Diagnosis not present

## 2015-11-02 DIAGNOSIS — Z4802 Encounter for removal of sutures: Secondary | ICD-10-CM | POA: Diagnosis not present

## 2015-11-02 DIAGNOSIS — E782 Mixed hyperlipidemia: Secondary | ICD-10-CM | POA: Diagnosis not present

## 2015-11-02 DIAGNOSIS — E039 Hypothyroidism, unspecified: Secondary | ICD-10-CM | POA: Diagnosis not present

## 2015-11-02 DIAGNOSIS — Z7901 Long term (current) use of anticoagulants: Secondary | ICD-10-CM | POA: Diagnosis not present

## 2015-11-02 DIAGNOSIS — F419 Anxiety disorder, unspecified: Secondary | ICD-10-CM | POA: Diagnosis not present

## 2015-11-02 DIAGNOSIS — I2510 Atherosclerotic heart disease of native coronary artery without angina pectoris: Secondary | ICD-10-CM | POA: Diagnosis not present

## 2015-11-02 DIAGNOSIS — I10 Essential (primary) hypertension: Secondary | ICD-10-CM | POA: Diagnosis not present

## 2015-11-02 DIAGNOSIS — I480 Paroxysmal atrial fibrillation: Secondary | ICD-10-CM | POA: Diagnosis not present

## 2015-11-07 DIAGNOSIS — I2510 Atherosclerotic heart disease of native coronary artery without angina pectoris: Secondary | ICD-10-CM | POA: Diagnosis not present

## 2015-11-10 DIAGNOSIS — E871 Hypo-osmolality and hyponatremia: Secondary | ICD-10-CM | POA: Diagnosis not present

## 2015-11-16 ENCOUNTER — Encounter: Payer: Self-pay | Admitting: Podiatry

## 2015-11-16 ENCOUNTER — Ambulatory Visit (INDEPENDENT_AMBULATORY_CARE_PROVIDER_SITE_OTHER): Payer: Medicare Other | Admitting: Podiatry

## 2015-11-16 DIAGNOSIS — M79609 Pain in unspecified limb: Secondary | ICD-10-CM

## 2015-11-16 DIAGNOSIS — B351 Tinea unguium: Secondary | ICD-10-CM | POA: Diagnosis not present

## 2015-11-16 DIAGNOSIS — Q828 Other specified congenital malformations of skin: Secondary | ICD-10-CM

## 2015-11-16 NOTE — Progress Notes (Signed)
Patient ID: Carol [name], female   DOB: 1928-08-26, 81 y.o.   MRN: [id number] Complaint:  Visit Type: Patient returns to my office for continued preventative foot care services. Complaint: Patient states" my nails have grown long and thick and become painful to walk and wear shoes" . She presents for preventative foot care services. No changes to ROS.  She has painful callus on bottom of both feet.  Podiatric Exam: Vascular: dorsalis pedis and posterior tibial pulses are palpable bilateral. Capillary return is immediate. Temperature gradient is WNL. Skin turgor WNL  Sensorium: Normal Semmes Weinstein monofilament test. Normal tactile sensation bilaterally. Nail Exam: Pt has thick disfigured discolored nails with subungual debris noted bilateral entire nail hallux through fifth toenails Ulcer Exam: There is no evidence of ulcer or pre-ulcerative changes or infection. Orthopedic Exam: Muscle tone and strength are WNL. No limitations in general ROM. No crepitus or effusions noted. Foot type and digits show no abnormalities. Bony prominences are unremarkable. Pain and redness posterior calcaneus left foot secondary to pressure.Skin:  Porokeratosis  Sub 4th metatarsal head B/L. No infection or ulcers   Diagnosis:  Tinea unguium, Pain in right toe, pain in left toes  Treatment & Plan Procedures and Treatment: Consent by patient was obtained for treatment procedures. The patient understood the discussion of treatment and procedures well. All questions were answered thoroughly reviewed. Debridement of mycotic and hypertrophic toenails, 1 through 5 bilateral and clearing of subungual debris. No ulceration, no infection noted.Debride porokeratosis  Return Visit-Office Procedure: Patient instructed to return to the office for a follow up visit 3 months for continued evaluation and treatment.  Gardiner Barefoot DPM

## 2015-11-30 DIAGNOSIS — C44622 Squamous cell carcinoma of skin of right upper limb, including shoulder: Secondary | ICD-10-CM | POA: Diagnosis not present

## 2015-11-30 DIAGNOSIS — Z85828 Personal history of other malignant neoplasm of skin: Secondary | ICD-10-CM | POA: Diagnosis not present

## 2015-11-30 DIAGNOSIS — C44722 Squamous cell carcinoma of skin of right lower limb, including hip: Secondary | ICD-10-CM | POA: Diagnosis not present

## 2015-11-30 DIAGNOSIS — D485 Neoplasm of uncertain behavior of skin: Secondary | ICD-10-CM | POA: Diagnosis not present

## 2015-12-01 DIAGNOSIS — I4891 Unspecified atrial fibrillation: Secondary | ICD-10-CM | POA: Diagnosis not present

## 2015-12-01 DIAGNOSIS — Z7901 Long term (current) use of anticoagulants: Secondary | ICD-10-CM | POA: Diagnosis not present

## 2015-12-12 ENCOUNTER — Other Ambulatory Visit: Payer: Self-pay

## 2015-12-12 DIAGNOSIS — Z1231 Encounter for screening mammogram for malignant neoplasm of breast: Secondary | ICD-10-CM

## 2015-12-15 DIAGNOSIS — Z7901 Long term (current) use of anticoagulants: Secondary | ICD-10-CM | POA: Diagnosis not present

## 2015-12-15 DIAGNOSIS — I4891 Unspecified atrial fibrillation: Secondary | ICD-10-CM | POA: Diagnosis not present

## 2016-01-11 DIAGNOSIS — Z85828 Personal history of other malignant neoplasm of skin: Secondary | ICD-10-CM | POA: Diagnosis not present

## 2016-01-11 DIAGNOSIS — L821 Other seborrheic keratosis: Secondary | ICD-10-CM | POA: Diagnosis not present

## 2016-01-11 DIAGNOSIS — L570 Actinic keratosis: Secondary | ICD-10-CM | POA: Diagnosis not present

## 2016-01-11 DIAGNOSIS — L603 Nail dystrophy: Secondary | ICD-10-CM | POA: Diagnosis not present

## 2016-01-11 DIAGNOSIS — L720 Epidermal cyst: Secondary | ICD-10-CM | POA: Diagnosis not present

## 2016-01-12 DIAGNOSIS — Z7901 Long term (current) use of anticoagulants: Secondary | ICD-10-CM | POA: Diagnosis not present

## 2016-01-12 DIAGNOSIS — I4891 Unspecified atrial fibrillation: Secondary | ICD-10-CM | POA: Diagnosis not present

## 2016-01-18 ENCOUNTER — Encounter: Payer: Self-pay | Admitting: Podiatry

## 2016-01-18 ENCOUNTER — Ambulatory Visit (INDEPENDENT_AMBULATORY_CARE_PROVIDER_SITE_OTHER): Payer: Medicare Other | Admitting: Podiatry

## 2016-01-18 DIAGNOSIS — Q828 Other specified congenital malformations of skin: Secondary | ICD-10-CM

## 2016-01-18 DIAGNOSIS — M79609 Pain in unspecified limb: Secondary | ICD-10-CM

## 2016-01-18 DIAGNOSIS — B351 Tinea unguium: Secondary | ICD-10-CM | POA: Diagnosis not present

## 2016-01-18 DIAGNOSIS — M79676 Pain in unspecified toe(s): Secondary | ICD-10-CM

## 2016-01-18 NOTE — Progress Notes (Signed)
Patient ID: Carol [name], female   DOB: 19-Aug-1928, 82 y.o.   MRN: [id number] Complaint:  Visit Type: Patient returns to my office for continued preventative foot care services. Complaint: Patient states" my nails have grown long and thick and become painful to walk and wear shoes" . She presents for preventative foot care services. No changes to ROS.  She has painful callus on bottom of both feet.  Podiatric Exam: Vascular: dorsalis pedis and posterior tibial pulses are palpable bilateral. Capillary return is immediate. Temperature gradient is WNL. Skin turgor WNL  Sensorium: Normal Semmes Weinstein monofilament test. Normal tactile sensation bilaterally. Nail Exam: Pt has thick disfigured discolored nails with subungual debris noted bilateral entire nail hallux through fifth toenails Ulcer Exam: There is no evidence of ulcer or pre-ulcerative changes or infection. Orthopedic Exam: Muscle tone and strength are WNL. No limitations in general ROM. No crepitus or effusions noted. Foot type and digits show no abnormalities. Bony prominences are unremarkable. Pain and redness posterior calcaneus left foot secondary to pressure.Skin:  Porokeratosis  Sub 4th metatarsal head B/L. No infection or ulcers   Diagnosis:  Tinea unguium, Pain in right toe, pain in left toes  Treatment & Plan Procedures and Treatment: Consent by patient was obtained for treatment procedures. The patient understood the discussion of treatment and procedures well. All questions were answered thoroughly reviewed. Debridement of mycotic and hypertrophic toenails, 1 through 5 bilateral and clearing of subungual debris. No ulceration, no infection noted.Debride porokeratosis  Return Visit-Office Procedure: Patient instructed to return to the office for a follow up visit 3 months for continued evaluation and treatment.  Gardiner Barefoot DPM

## 2016-01-20 ENCOUNTER — Ambulatory Visit
Admission: RE | Admit: 2016-01-20 | Discharge: 2016-01-20 | Disposition: A | Discharge status: Home / Self Care | Payer: Medicare Other | Source: Ambulatory Visit

## 2016-01-20 DIAGNOSIS — Z1231 Encounter for screening mammogram for malignant neoplasm of breast: Secondary | ICD-10-CM

## 2016-02-09 DIAGNOSIS — I4891 Unspecified atrial fibrillation: Secondary | ICD-10-CM | POA: Diagnosis not present

## 2016-02-09 DIAGNOSIS — Z7901 Long term (current) use of anticoagulants: Secondary | ICD-10-CM | POA: Diagnosis not present

## 2016-03-08 DIAGNOSIS — Z7901 Long term (current) use of anticoagulants: Secondary | ICD-10-CM | POA: Diagnosis not present

## 2016-03-08 DIAGNOSIS — I4891 Unspecified atrial fibrillation: Secondary | ICD-10-CM | POA: Diagnosis not present

## 2016-04-05 ENCOUNTER — Ambulatory Visit (INDEPENDENT_AMBULATORY_CARE_PROVIDER_SITE_OTHER): Payer: Medicare Other | Admitting: Podiatry

## 2016-04-05 ENCOUNTER — Encounter: Payer: Self-pay | Admitting: Podiatry

## 2016-04-05 DIAGNOSIS — M79609 Pain in unspecified limb: Secondary | ICD-10-CM

## 2016-04-05 DIAGNOSIS — B351 Tinea unguium: Secondary | ICD-10-CM

## 2016-04-05 DIAGNOSIS — Q828 Other specified congenital malformations of skin: Secondary | ICD-10-CM | POA: Diagnosis not present

## 2016-04-05 DIAGNOSIS — Z7901 Long term (current) use of anticoagulants: Secondary | ICD-10-CM | POA: Diagnosis not present

## 2016-04-05 DIAGNOSIS — I4891 Unspecified atrial fibrillation: Secondary | ICD-10-CM | POA: Diagnosis not present

## 2016-04-05 NOTE — Progress Notes (Signed)
Patient ID: Carol [name], female   DOB: 1928/08/13, 82 y.o.   MRN: [id number] Complaint:  Visit Type: Patient returns to my office for continued preventative foot care services. Complaint: Patient states" my nails have grown long and thick and become painful to walk and wear shoes" . She presents for preventative foot care services. No changes to ROS.  She has painful callus on bottom of both feet.  Podiatric Exam: Vascular: dorsalis pedis and posterior tibial pulses are palpable bilateral. Capillary return is immediate. Temperature gradient is WNL. Skin turgor WNL  Sensorium: Normal Semmes Weinstein monofilament test. Normal tactile sensation bilaterally. Nail Exam: Pt has thick disfigured discolored nails with subungual debris noted bilateral entire nail hallux through fifth toenails Ulcer Exam: There is no evidence of ulcer or pre-ulcerative changes or infection. Orthopedic Exam: Muscle tone and strength are WNL. No limitations in general ROM. No crepitus or effusions noted. Foot type and digits show no abnormalities. Bony prominences are unremarkable. Pain and redness posterior calcaneus left foot secondary to pressure.Skin:  Porokeratosis  Sub 4th metatarsal head B/L. No infection or ulcers   Diagnosis:  Tinea unguium, Pain in right toe, pain in left toes  Treatment & Plan Procedures and Treatment: Consent by patient was obtained for treatment procedures. The patient understood the discussion of treatment and procedures well. All questions were answered thoroughly reviewed. Debridement of mycotic and hypertrophic toenails, 1 through 5 bilateral and clearing of subungual debris. No ulceration, no infection noted.Debride porokeratosis  Return Visit-Office Procedure: Patient instructed to return to the office for a follow up visit 3 months for continued evaluation and treatment.  Gardiner Barefoot DPM

## 2016-04-19 DIAGNOSIS — I4891 Unspecified atrial fibrillation: Secondary | ICD-10-CM | POA: Diagnosis not present

## 2016-04-19 DIAGNOSIS — I350 Nonrheumatic aortic (valve) stenosis: Secondary | ICD-10-CM | POA: Diagnosis not present

## 2016-04-19 DIAGNOSIS — Z952 Presence of prosthetic heart valve: Secondary | ICD-10-CM | POA: Diagnosis not present

## 2016-04-19 DIAGNOSIS — Z7901 Long term (current) use of anticoagulants: Secondary | ICD-10-CM | POA: Diagnosis not present

## 2016-04-19 DIAGNOSIS — E785 Hyperlipidemia, unspecified: Secondary | ICD-10-CM | POA: Diagnosis not present

## 2016-04-19 DIAGNOSIS — Z951 Presence of aortocoronary bypass graft: Secondary | ICD-10-CM | POA: Diagnosis not present

## 2016-04-19 DIAGNOSIS — I480 Paroxysmal atrial fibrillation: Secondary | ICD-10-CM | POA: Diagnosis not present

## 2016-04-19 DIAGNOSIS — E039 Hypothyroidism, unspecified: Secondary | ICD-10-CM | POA: Diagnosis not present

## 2016-04-19 DIAGNOSIS — I2510 Atherosclerotic heart disease of native coronary artery without angina pectoris: Secondary | ICD-10-CM | POA: Diagnosis not present

## 2016-04-19 DIAGNOSIS — I10 Essential (primary) hypertension: Secondary | ICD-10-CM | POA: Diagnosis not present

## 2016-04-23 DIAGNOSIS — H34812 Central retinal vein occlusion, left eye, with macular edema: Secondary | ICD-10-CM | POA: Diagnosis not present

## 2016-04-24 ENCOUNTER — Encounter (INDEPENDENT_AMBULATORY_CARE_PROVIDER_SITE_OTHER): Payer: Medicare Other | Admitting: Ophthalmology

## 2016-04-24 DIAGNOSIS — H35033 Hypertensive retinopathy, bilateral: Secondary | ICD-10-CM | POA: Diagnosis not present

## 2016-04-24 DIAGNOSIS — H34812 Central retinal vein occlusion, left eye, with macular edema: Secondary | ICD-10-CM | POA: Diagnosis not present

## 2016-04-24 DIAGNOSIS — H43813 Vitreous degeneration, bilateral: Secondary | ICD-10-CM

## 2016-04-24 DIAGNOSIS — L820 Inflamed seborrheic keratosis: Secondary | ICD-10-CM | POA: Diagnosis not present

## 2016-04-24 DIAGNOSIS — Z85828 Personal history of other malignant neoplasm of skin: Secondary | ICD-10-CM | POA: Diagnosis not present

## 2016-04-24 DIAGNOSIS — L309 Dermatitis, unspecified: Secondary | ICD-10-CM | POA: Diagnosis not present

## 2016-04-24 DIAGNOSIS — I10 Essential (primary) hypertension: Secondary | ICD-10-CM

## 2016-05-17 ENCOUNTER — Encounter (INDEPENDENT_AMBULATORY_CARE_PROVIDER_SITE_OTHER): Payer: Medicare Other | Admitting: Ophthalmology

## 2016-05-17 DIAGNOSIS — Z7901 Long term (current) use of anticoagulants: Secondary | ICD-10-CM | POA: Diagnosis not present

## 2016-05-17 DIAGNOSIS — I4891 Unspecified atrial fibrillation: Secondary | ICD-10-CM | POA: Diagnosis not present

## 2016-05-17 DIAGNOSIS — H34812 Central retinal vein occlusion, left eye, with macular edema: Secondary | ICD-10-CM

## 2016-05-17 DIAGNOSIS — H35033 Hypertensive retinopathy, bilateral: Secondary | ICD-10-CM

## 2016-05-17 DIAGNOSIS — H43813 Vitreous degeneration, bilateral: Secondary | ICD-10-CM

## 2016-05-17 DIAGNOSIS — I10 Essential (primary) hypertension: Secondary | ICD-10-CM

## 2016-05-31 DIAGNOSIS — M961 Postlaminectomy syndrome, not elsewhere classified: Secondary | ICD-10-CM | POA: Diagnosis not present

## 2016-05-31 DIAGNOSIS — M8580 Other specified disorders of bone density and structure, unspecified site: Secondary | ICD-10-CM | POA: Diagnosis not present

## 2016-05-31 DIAGNOSIS — I2510 Atherosclerotic heart disease of native coronary artery without angina pectoris: Secondary | ICD-10-CM | POA: Diagnosis not present

## 2016-05-31 DIAGNOSIS — E039 Hypothyroidism, unspecified: Secondary | ICD-10-CM | POA: Diagnosis not present

## 2016-05-31 DIAGNOSIS — I480 Paroxysmal atrial fibrillation: Secondary | ICD-10-CM | POA: Diagnosis not present

## 2016-05-31 DIAGNOSIS — M25511 Pain in right shoulder: Secondary | ICD-10-CM | POA: Diagnosis not present

## 2016-05-31 DIAGNOSIS — E782 Mixed hyperlipidemia: Secondary | ICD-10-CM | POA: Diagnosis not present

## 2016-05-31 DIAGNOSIS — F419 Anxiety disorder, unspecified: Secondary | ICD-10-CM | POA: Diagnosis not present

## 2016-05-31 DIAGNOSIS — D692 Other nonthrombocytopenic purpura: Secondary | ICD-10-CM | POA: Diagnosis not present

## 2016-05-31 DIAGNOSIS — I10 Essential (primary) hypertension: Secondary | ICD-10-CM | POA: Diagnosis not present

## 2016-05-31 DIAGNOSIS — Z0000 Encounter for general adult medical examination without abnormal findings: Secondary | ICD-10-CM | POA: Diagnosis not present

## 2016-05-31 DIAGNOSIS — M25512 Pain in left shoulder: Secondary | ICD-10-CM | POA: Diagnosis not present

## 2016-06-06 DIAGNOSIS — M25511 Pain in right shoulder: Secondary | ICD-10-CM | POA: Diagnosis not present

## 2016-06-06 DIAGNOSIS — M25512 Pain in left shoulder: Secondary | ICD-10-CM | POA: Diagnosis not present

## 2016-06-06 DIAGNOSIS — M7542 Impingement syndrome of left shoulder: Secondary | ICD-10-CM | POA: Diagnosis not present

## 2016-06-08 ENCOUNTER — Encounter (INDEPENDENT_AMBULATORY_CARE_PROVIDER_SITE_OTHER): Payer: Medicare Other | Admitting: Ophthalmology

## 2016-06-08 DIAGNOSIS — H35033 Hypertensive retinopathy, bilateral: Secondary | ICD-10-CM | POA: Diagnosis not present

## 2016-06-08 DIAGNOSIS — H43813 Vitreous degeneration, bilateral: Secondary | ICD-10-CM | POA: Diagnosis not present

## 2016-06-08 DIAGNOSIS — I10 Essential (primary) hypertension: Secondary | ICD-10-CM | POA: Diagnosis not present

## 2016-06-08 DIAGNOSIS — H34812 Central retinal vein occlusion, left eye, with macular edema: Secondary | ICD-10-CM

## 2016-06-14 ENCOUNTER — Ambulatory Visit: Payer: Medicare Other | Admitting: Podiatry

## 2016-06-18 DIAGNOSIS — M7542 Impingement syndrome of left shoulder: Secondary | ICD-10-CM | POA: Diagnosis not present

## 2016-06-18 DIAGNOSIS — I4891 Unspecified atrial fibrillation: Secondary | ICD-10-CM | POA: Diagnosis not present

## 2016-06-18 DIAGNOSIS — Z7901 Long term (current) use of anticoagulants: Secondary | ICD-10-CM | POA: Diagnosis not present

## 2016-06-20 ENCOUNTER — Encounter: Payer: Self-pay | Admitting: Podiatry

## 2016-06-20 ENCOUNTER — Ambulatory Visit (INDEPENDENT_AMBULATORY_CARE_PROVIDER_SITE_OTHER): Payer: Medicare Other | Admitting: Podiatry

## 2016-06-20 DIAGNOSIS — B351 Tinea unguium: Secondary | ICD-10-CM

## 2016-06-20 DIAGNOSIS — Q828 Other specified congenital malformations of skin: Secondary | ICD-10-CM | POA: Diagnosis not present

## 2016-06-20 DIAGNOSIS — M79676 Pain in unspecified toe(s): Secondary | ICD-10-CM

## 2016-06-20 DIAGNOSIS — M79609 Pain in unspecified limb: Secondary | ICD-10-CM

## 2016-06-20 NOTE — Progress Notes (Signed)
Patient ID: Carol [name], female   DOB: Nov 12, 1928, 82 y.o.   MRN: [id number] Complaint:  Visit Type: Patient returns to my office for continued preventative foot care services. Complaint: Patient states" my nails have grown long and thick and become painful to walk and wear shoes" . She presents for preventative foot care services. No changes to ROS.  She has painful callus on bottom of both feet.  Podiatric Exam: Vascular: dorsalis pedis and posterior tibial pulses are palpable bilateral. Capillary return is immediate. Temperature gradient is WNL. Skin turgor WNL  Sensorium: Normal Semmes Weinstein monofilament test. Normal tactile sensation bilaterally. Nail Exam: Pt has thick disfigured discolored nails with subungual debris noted bilateral entire nail hallux through fifth toenails Ulcer Exam: There is no evidence of ulcer or pre-ulcerative changes or infection. Orthopedic Exam: Muscle tone and strength are WNL. No limitations in general ROM. No crepitus or effusions noted. Foot type and digits show no abnormalities. Bony prominences are unremarkable. Pain and redness posterior calcaneus left foot secondary to pressure.Skin:  Porokeratosis  Sub 4th metatarsal head B/L. No infection or ulcers   Diagnosis:  Tinea unguium, Pain in right toe, pain in left toes  Treatment & Plan Procedures and Treatment: Consent by patient was obtained for treatment procedures. The patient understood the discussion of treatment and procedures well. All questions were answered thoroughly reviewed. Debridement of mycotic and hypertrophic toenails, 1 through 5 bilateral and clearing of subungual debris. No ulceration, no infection noted.Debride porokeratosis  Return Visit-Office Procedure: Patient instructed to return to the office for a follow up visit 3 months for continued evaluation and treatment.  Gardiner Barefoot DPM

## 2016-06-21 DIAGNOSIS — Z7901 Long term (current) use of anticoagulants: Secondary | ICD-10-CM | POA: Diagnosis not present

## 2016-06-25 DIAGNOSIS — M19012 Primary osteoarthritis, left shoulder: Secondary | ICD-10-CM | POA: Diagnosis not present

## 2016-07-05 DIAGNOSIS — I4891 Unspecified atrial fibrillation: Secondary | ICD-10-CM | POA: Diagnosis not present

## 2016-07-05 DIAGNOSIS — Z7901 Long term (current) use of anticoagulants: Secondary | ICD-10-CM | POA: Diagnosis not present

## 2016-07-06 ENCOUNTER — Encounter (INDEPENDENT_AMBULATORY_CARE_PROVIDER_SITE_OTHER): Payer: Medicare Other | Admitting: Ophthalmology

## 2016-07-06 DIAGNOSIS — H35033 Hypertensive retinopathy, bilateral: Secondary | ICD-10-CM

## 2016-07-06 DIAGNOSIS — I10 Essential (primary) hypertension: Secondary | ICD-10-CM | POA: Diagnosis not present

## 2016-07-06 DIAGNOSIS — H43813 Vitreous degeneration, bilateral: Secondary | ICD-10-CM

## 2016-07-06 DIAGNOSIS — H34812 Central retinal vein occlusion, left eye, with macular edema: Secondary | ICD-10-CM

## 2016-08-02 DIAGNOSIS — Z7901 Long term (current) use of anticoagulants: Secondary | ICD-10-CM | POA: Diagnosis not present

## 2016-08-02 DIAGNOSIS — I4891 Unspecified atrial fibrillation: Secondary | ICD-10-CM | POA: Diagnosis not present

## 2016-08-03 ENCOUNTER — Encounter (INDEPENDENT_AMBULATORY_CARE_PROVIDER_SITE_OTHER): Payer: Medicare Other | Admitting: Ophthalmology

## 2016-08-03 DIAGNOSIS — H34812 Central retinal vein occlusion, left eye, with macular edema: Secondary | ICD-10-CM

## 2016-08-03 DIAGNOSIS — H43813 Vitreous degeneration, bilateral: Secondary | ICD-10-CM

## 2016-08-03 DIAGNOSIS — I10 Essential (primary) hypertension: Secondary | ICD-10-CM

## 2016-08-03 DIAGNOSIS — H35033 Hypertensive retinopathy, bilateral: Secondary | ICD-10-CM

## 2016-08-08 DIAGNOSIS — L565 Disseminated superficial actinic porokeratosis (DSAP): Secondary | ICD-10-CM | POA: Diagnosis not present

## 2016-08-08 DIAGNOSIS — D692 Other nonthrombocytopenic purpura: Secondary | ICD-10-CM | POA: Diagnosis not present

## 2016-08-08 DIAGNOSIS — Z85828 Personal history of other malignant neoplasm of skin: Secondary | ICD-10-CM | POA: Diagnosis not present

## 2016-08-08 DIAGNOSIS — I788 Other diseases of capillaries: Secondary | ICD-10-CM | POA: Diagnosis not present

## 2016-08-13 ENCOUNTER — Encounter (INDEPENDENT_AMBULATORY_CARE_PROVIDER_SITE_OTHER): Payer: Medicare Other | Admitting: Ophthalmology

## 2016-08-29 ENCOUNTER — Encounter: Payer: Self-pay | Admitting: Podiatry

## 2016-08-29 ENCOUNTER — Ambulatory Visit (INDEPENDENT_AMBULATORY_CARE_PROVIDER_SITE_OTHER): Payer: Medicare Other | Admitting: Podiatry

## 2016-08-29 DIAGNOSIS — B351 Tinea unguium: Secondary | ICD-10-CM | POA: Diagnosis not present

## 2016-08-29 DIAGNOSIS — Q828 Other specified congenital malformations of skin: Secondary | ICD-10-CM

## 2016-08-29 DIAGNOSIS — M79609 Pain in unspecified limb: Secondary | ICD-10-CM

## 2016-08-29 DIAGNOSIS — M79676 Pain in unspecified toe(s): Secondary | ICD-10-CM

## 2016-08-29 NOTE — Progress Notes (Signed)
Patient ID: Carol [name], female   DOB: 1928-05-22, 82 y.o.   MRN: [id number] Complaint:  Visit Type: Patient returns to my office for continued preventative foot care services. Complaint: Patient states" my nails have grown long and thick and become painful to walk and wear shoes" . She presents for preventative foot care services. No changes to ROS.  She has painful callus on bottom of both feet.  Podiatric Exam: Vascular: dorsalis pedis and posterior tibial pulses are palpable bilateral. Capillary return is immediate. Temperature gradient is WNL. Skin turgor WNL  Sensorium: Normal Semmes Weinstein monofilament test. Normal tactile sensation bilaterally. Nail Exam: Pt has thick disfigured discolored nails with subungual debris noted bilateral entire nail hallux through fifth toenails Ulcer Exam: There is no evidence of ulcer or pre-ulcerative changes or infection. Orthopedic Exam: Muscle tone and strength are WNL. No limitations in general ROM. No crepitus or effusions noted. Foot type and digits show no abnormalities. Bony prominences are unremarkable. Pain and redness posterior calcaneus left foot secondary to pressure.Skin:  Porokeratosis  Sub 4th metatarsal head B/L. No infection or ulcers   Diagnosis:  Tinea unguium, Pain in right toe, pain in left toes. Porokeratosis  B/L  Treatment & Plan Procedures and Treatment: Consent by patient was obtained for treatment procedures. The patient understood the discussion of treatment and procedures well. All questions were answered thoroughly reviewed. Debridement of mycotic and hypertrophic toenails, 1 through 5 bilateral and clearing of subungual debris. No ulceration, no infection noted.Debride porokeratosis  Return Visit-Office Procedure: Patient instructed to return to the office for a follow up visit 3 months for continued evaluation and treatment.  Gardiner Barefoot DPM

## 2016-08-30 DIAGNOSIS — I4891 Unspecified atrial fibrillation: Secondary | ICD-10-CM | POA: Diagnosis not present

## 2016-08-30 DIAGNOSIS — Z7901 Long term (current) use of anticoagulants: Secondary | ICD-10-CM | POA: Diagnosis not present

## 2016-09-06 ENCOUNTER — Encounter (INDEPENDENT_AMBULATORY_CARE_PROVIDER_SITE_OTHER): Payer: Medicare Other | Admitting: Ophthalmology

## 2016-09-06 DIAGNOSIS — H34812 Central retinal vein occlusion, left eye, with macular edema: Secondary | ICD-10-CM

## 2016-09-06 DIAGNOSIS — H35033 Hypertensive retinopathy, bilateral: Secondary | ICD-10-CM

## 2016-09-06 DIAGNOSIS — H43813 Vitreous degeneration, bilateral: Secondary | ICD-10-CM | POA: Diagnosis not present

## 2016-09-06 DIAGNOSIS — I10 Essential (primary) hypertension: Secondary | ICD-10-CM | POA: Diagnosis not present

## 2016-09-27 DIAGNOSIS — Z7901 Long term (current) use of anticoagulants: Secondary | ICD-10-CM | POA: Diagnosis not present

## 2016-09-27 DIAGNOSIS — I4891 Unspecified atrial fibrillation: Secondary | ICD-10-CM | POA: Diagnosis not present

## 2016-10-01 ENCOUNTER — Encounter (INDEPENDENT_AMBULATORY_CARE_PROVIDER_SITE_OTHER): Payer: Medicare Other | Admitting: Ophthalmology

## 2016-10-01 DIAGNOSIS — I10 Essential (primary) hypertension: Secondary | ICD-10-CM

## 2016-10-01 DIAGNOSIS — H34812 Central retinal vein occlusion, left eye, with macular edema: Secondary | ICD-10-CM

## 2016-10-01 DIAGNOSIS — H43813 Vitreous degeneration, bilateral: Secondary | ICD-10-CM | POA: Diagnosis not present

## 2016-10-01 DIAGNOSIS — H35033 Hypertensive retinopathy, bilateral: Secondary | ICD-10-CM | POA: Diagnosis not present

## 2016-10-03 DIAGNOSIS — M961 Postlaminectomy syndrome, not elsewhere classified: Secondary | ICD-10-CM | POA: Diagnosis not present

## 2016-10-03 DIAGNOSIS — K5900 Constipation, unspecified: Secondary | ICD-10-CM | POA: Diagnosis not present

## 2016-10-24 ENCOUNTER — Encounter (INDEPENDENT_AMBULATORY_CARE_PROVIDER_SITE_OTHER): Payer: Medicare Other | Admitting: Ophthalmology

## 2016-10-25 ENCOUNTER — Encounter (INDEPENDENT_AMBULATORY_CARE_PROVIDER_SITE_OTHER): Payer: Medicare Other | Admitting: Ophthalmology

## 2016-10-25 DIAGNOSIS — H35033 Hypertensive retinopathy, bilateral: Secondary | ICD-10-CM | POA: Diagnosis not present

## 2016-10-25 DIAGNOSIS — H43813 Vitreous degeneration, bilateral: Secondary | ICD-10-CM

## 2016-10-25 DIAGNOSIS — H34812 Central retinal vein occlusion, left eye, with macular edema: Secondary | ICD-10-CM | POA: Diagnosis not present

## 2016-10-25 DIAGNOSIS — I10 Essential (primary) hypertension: Secondary | ICD-10-CM

## 2016-10-26 ENCOUNTER — Encounter (INDEPENDENT_AMBULATORY_CARE_PROVIDER_SITE_OTHER): Payer: Medicare Other | Admitting: Ophthalmology

## 2016-10-29 DIAGNOSIS — Z7901 Long term (current) use of anticoagulants: Secondary | ICD-10-CM | POA: Diagnosis not present

## 2016-10-29 DIAGNOSIS — I4891 Unspecified atrial fibrillation: Secondary | ICD-10-CM | POA: Diagnosis not present

## 2016-11-07 ENCOUNTER — Ambulatory Visit (INDEPENDENT_AMBULATORY_CARE_PROVIDER_SITE_OTHER): Payer: Medicare Other | Admitting: Podiatry

## 2016-11-07 ENCOUNTER — Encounter: Payer: Self-pay | Admitting: Podiatry

## 2016-11-07 VITALS — Ht 63.0 in | Wt 142.0 lb

## 2016-11-07 DIAGNOSIS — L84 Corns and callosities: Secondary | ICD-10-CM | POA: Diagnosis not present

## 2016-11-07 DIAGNOSIS — B351 Tinea unguium: Secondary | ICD-10-CM

## 2016-11-07 DIAGNOSIS — M79609 Pain in unspecified limb: Secondary | ICD-10-CM

## 2016-11-07 NOTE — Progress Notes (Signed)
Patient ID: LORIA LACINA, female   DOB: 1928/05/08, 82 y.o.   MRN: [id number] Complaint:  Visit Type: Patient returns to my office for continued preventative foot care services. Complaint: Patient states" my nails have grown long and thick and become painful to walk and wear shoes" . She presents for preventative foot care services. No changes to ROS.  She has painful callus on bottom of both feet.  Podiatric Exam: Vascular: dorsalis pedis and posterior tibial pulses are palpable bilateral. Capillary return is immediate. Temperature gradient is WNL. Skin turgor WNL  Sensorium: Normal Semmes Weinstein monofilament test. Normal tactile sensation bilaterally. Nail Exam: Pt has thick disfigured discolored nails with subungual debris noted bilateral entire nail hallux through fifth toenails Ulcer Exam: There is no evidence of ulcer or pre-ulcerative changes or infection. Orthopedic Exam: Muscle tone and strength are WNL. No limitations in general ROM. No crepitus or effusions noted. Foot type and digits show no abnormalities. Bony prominences are unremarkable.                                                                   .Skin:  Porokeratosis  Sub 4th metatarsal head B/L. No infection or ulcers   Diagnosis:  Tinea unguium, Pain in right toe, pain in left toes. Porokeratosis  B/L  Treatment & Plan Procedures and Treatment: Consent by patient was obtained for treatment procedures. The patient understood the discussion of treatment and procedures well. All questions were answered thoroughly reviewed. Debridement of mycotic and hypertrophic toenails, 1 through 5 bilateral and clearing of subungual debris. No ulceration, no infection noted.Debride porokeratosis  Return Visit-Office Procedure: Patient instructed to return to the office for a follow up visit 3 months for continued evaluation and treatment.  Gardiner Barefoot DPM

## 2016-11-08 DIAGNOSIS — Z7901 Long term (current) use of anticoagulants: Secondary | ICD-10-CM | POA: Diagnosis not present

## 2016-11-08 DIAGNOSIS — I4891 Unspecified atrial fibrillation: Secondary | ICD-10-CM | POA: Diagnosis not present

## 2016-11-12 DIAGNOSIS — Z7901 Long term (current) use of anticoagulants: Secondary | ICD-10-CM | POA: Diagnosis not present

## 2016-11-12 DIAGNOSIS — I4891 Unspecified atrial fibrillation: Secondary | ICD-10-CM | POA: Diagnosis not present

## 2016-11-22 ENCOUNTER — Encounter (INDEPENDENT_AMBULATORY_CARE_PROVIDER_SITE_OTHER): Payer: Medicare Other | Admitting: Ophthalmology

## 2016-11-22 DIAGNOSIS — I10 Essential (primary) hypertension: Secondary | ICD-10-CM | POA: Diagnosis not present

## 2016-11-22 DIAGNOSIS — H34812 Central retinal vein occlusion, left eye, with macular edema: Secondary | ICD-10-CM

## 2016-11-22 DIAGNOSIS — H35033 Hypertensive retinopathy, bilateral: Secondary | ICD-10-CM | POA: Diagnosis not present

## 2016-11-22 DIAGNOSIS — H43813 Vitreous degeneration, bilateral: Secondary | ICD-10-CM | POA: Diagnosis not present

## 2016-11-23 DIAGNOSIS — I2510 Atherosclerotic heart disease of native coronary artery without angina pectoris: Secondary | ICD-10-CM | POA: Diagnosis not present

## 2016-11-23 DIAGNOSIS — I480 Paroxysmal atrial fibrillation: Secondary | ICD-10-CM | POA: Diagnosis not present

## 2016-11-23 DIAGNOSIS — Z952 Presence of prosthetic heart valve: Secondary | ICD-10-CM | POA: Diagnosis not present

## 2016-11-23 DIAGNOSIS — E039 Hypothyroidism, unspecified: Secondary | ICD-10-CM | POA: Diagnosis not present

## 2016-11-23 DIAGNOSIS — Z951 Presence of aortocoronary bypass graft: Secondary | ICD-10-CM | POA: Diagnosis not present

## 2016-11-23 DIAGNOSIS — I350 Nonrheumatic aortic (valve) stenosis: Secondary | ICD-10-CM | POA: Diagnosis not present

## 2016-11-23 DIAGNOSIS — I10 Essential (primary) hypertension: Secondary | ICD-10-CM | POA: Diagnosis not present

## 2016-11-23 DIAGNOSIS — Z7901 Long term (current) use of anticoagulants: Secondary | ICD-10-CM | POA: Diagnosis not present

## 2016-11-23 DIAGNOSIS — E785 Hyperlipidemia, unspecified: Secondary | ICD-10-CM | POA: Diagnosis not present

## 2016-12-03 DIAGNOSIS — I480 Paroxysmal atrial fibrillation: Secondary | ICD-10-CM | POA: Diagnosis not present

## 2016-12-03 DIAGNOSIS — N3281 Overactive bladder: Secondary | ICD-10-CM | POA: Diagnosis not present

## 2016-12-03 DIAGNOSIS — K5900 Constipation, unspecified: Secondary | ICD-10-CM | POA: Diagnosis not present

## 2016-12-03 DIAGNOSIS — Z7901 Long term (current) use of anticoagulants: Secondary | ICD-10-CM | POA: Diagnosis not present

## 2016-12-03 DIAGNOSIS — I10 Essential (primary) hypertension: Secondary | ICD-10-CM | POA: Diagnosis not present

## 2016-12-03 DIAGNOSIS — E039 Hypothyroidism, unspecified: Secondary | ICD-10-CM | POA: Diagnosis not present

## 2016-12-13 ENCOUNTER — Encounter (INDEPENDENT_AMBULATORY_CARE_PROVIDER_SITE_OTHER): Payer: Medicare Other | Admitting: Ophthalmology

## 2016-12-13 DIAGNOSIS — H35033 Hypertensive retinopathy, bilateral: Secondary | ICD-10-CM

## 2016-12-13 DIAGNOSIS — H34812 Central retinal vein occlusion, left eye, with macular edema: Secondary | ICD-10-CM

## 2016-12-13 DIAGNOSIS — I10 Essential (primary) hypertension: Secondary | ICD-10-CM | POA: Diagnosis not present

## 2016-12-13 DIAGNOSIS — H43813 Vitreous degeneration, bilateral: Secondary | ICD-10-CM

## 2016-12-20 DIAGNOSIS — I4891 Unspecified atrial fibrillation: Secondary | ICD-10-CM | POA: Diagnosis not present

## 2016-12-20 DIAGNOSIS — Z7901 Long term (current) use of anticoagulants: Secondary | ICD-10-CM | POA: Diagnosis not present

## 2017-01-10 ENCOUNTER — Encounter (INDEPENDENT_AMBULATORY_CARE_PROVIDER_SITE_OTHER): Payer: Medicare Other | Admitting: Ophthalmology

## 2017-01-15 ENCOUNTER — Emergency Department (HOSPITAL_COMMUNITY): Payer: Medicare Other

## 2017-01-15 ENCOUNTER — Other Ambulatory Visit: Payer: Self-pay | Admitting: Family Medicine

## 2017-01-15 ENCOUNTER — Encounter (HOSPITAL_COMMUNITY): Payer: Self-pay | Admitting: Emergency Medicine

## 2017-01-15 ENCOUNTER — Emergency Department (HOSPITAL_COMMUNITY)
Admission: EM | Admit: 2017-01-15 | Discharge: 2017-01-16 | Disposition: A | Discharge status: Home / Self Care | Payer: Medicare Other | Attending: Emergency Medicine | Admitting: Emergency Medicine

## 2017-01-15 DIAGNOSIS — Z87891 Personal history of nicotine dependence: Secondary | ICD-10-CM | POA: Diagnosis not present

## 2017-01-15 DIAGNOSIS — I2510 Atherosclerotic heart disease of native coronary artery without angina pectoris: Secondary | ICD-10-CM | POA: Diagnosis not present

## 2017-01-15 DIAGNOSIS — Z961 Presence of intraocular lens: Secondary | ICD-10-CM | POA: Diagnosis not present

## 2017-01-15 DIAGNOSIS — Z951 Presence of aortocoronary bypass graft: Secondary | ICD-10-CM | POA: Diagnosis not present

## 2017-01-15 DIAGNOSIS — I5032 Chronic diastolic (congestive) heart failure: Secondary | ICD-10-CM | POA: Diagnosis not present

## 2017-01-15 DIAGNOSIS — H348122 Central retinal vein occlusion, left eye, stable: Secondary | ICD-10-CM | POA: Diagnosis not present

## 2017-01-15 DIAGNOSIS — E039 Hypothyroidism, unspecified: Secondary | ICD-10-CM | POA: Insufficient documentation

## 2017-01-15 DIAGNOSIS — H47011 Ischemic optic neuropathy, right eye: Secondary | ICD-10-CM | POA: Diagnosis not present

## 2017-01-15 DIAGNOSIS — I110 Hypertensive heart disease with heart failure: Secondary | ICD-10-CM | POA: Diagnosis not present

## 2017-01-15 DIAGNOSIS — Z7984 Long term (current) use of oral hypoglycemic drugs: Secondary | ICD-10-CM | POA: Diagnosis not present

## 2017-01-15 DIAGNOSIS — H538 Other visual disturbances: Secondary | ICD-10-CM | POA: Insufficient documentation

## 2017-01-15 DIAGNOSIS — Z1231 Encounter for screening mammogram for malignant neoplasm of breast: Secondary | ICD-10-CM

## 2017-01-15 LAB — CBC WITH DIFFERENTIAL/PLATELET
Basophils Absolute: 0 10*3/uL (ref 0.0–0.1)
Basophils Relative: 0 %
Eosinophils Absolute: 0.1 10*3/uL (ref 0.0–0.7)
Eosinophils Relative: 2 %
HCT: 39.4 % (ref 36.0–46.0)
Hemoglobin: 12.9 g/dL (ref 12.0–15.0)
Lymphocytes Relative: 21 %
Lymphs Abs: 1.8 10*3/uL (ref 0.7–4.0)
MCH: 28.4 pg (ref 26.0–34.0)
MCHC: 32.7 g/dL (ref 30.0–36.0)
MCV: 86.6 fL (ref 78.0–100.0)
Monocytes Absolute: 0.5 10*3/uL (ref 0.1–1.0)
Monocytes Relative: 6 %
Neutro Abs: 6 10*3/uL (ref 1.7–7.7)
Neutrophils Relative %: 71 %
Platelets: 204 10*3/uL (ref 150–400)
RBC: 4.55 MIL/uL (ref 3.87–5.11)
RDW: 14.9 % (ref 11.5–15.5)
WBC: 8.5 10*3/uL (ref 4.0–10.5)

## 2017-01-15 LAB — SEDIMENTATION RATE: Sed Rate: 40 mm/hr — ABNORMAL HIGH (ref 0–22)

## 2017-01-15 LAB — BASIC METABOLIC PANEL
Anion gap: 13 (ref 5–15)
BUN: 25 mg/dL — ABNORMAL HIGH (ref 6–20)
CO2: 25 mmol/L (ref 22–32)
Calcium: 10.2 mg/dL (ref 8.9–10.3)
Chloride: 95 mmol/L — ABNORMAL LOW (ref 101–111)
Creatinine, Ser: 0.91 mg/dL (ref 0.44–1.00)
GFR calc Af Amer: >60 mL/min (ref >60)
GFR calc non Af Amer: 55 mL/min — ABNORMAL LOW (ref >60)
Glucose, Bld: 115 mg/dL — ABNORMAL HIGH (ref 65–99)
Potassium: 4.6 mmol/L (ref 3.5–5.1)
Sodium: 133 mmol/L — ABNORMAL LOW (ref 135–145)

## 2017-01-15 LAB — C-REACTIVE PROTEIN: CRP: <0.8 mg/dL (ref <1.0)

## 2017-01-15 MED ORDER — METHYLPREDNISOLONE SODIUM SUCC 125 MG IJ SOLR
60.0000 mg | Freq: Once | INTRAMUSCULAR | Status: AC
  Administered 2017-01-15: 60 mg via INTRAVENOUS
  Filled 2017-01-15: qty 2

## 2017-01-15 MED ORDER — PREDNISONE 20 MG PO TABS: ORAL_TABLET | ORAL | 0 refills | Status: DC

## 2017-01-15 NOTE — ED Notes (Signed)
Pt reports having fuzziness in right eye today after swimming. Pt thought that she had gotten something in her eye while showering after the poll but noticed it did not go away. Pt still blurry however eye is dilated currently from eye doctor.

## 2017-01-15 NOTE — ED Provider Notes (Signed)
[facility] DEPT Provider Note   CSN: [id number] Arrival date & time: 01/15/17  1944     History   Chief Complaint Chief Complaint  Patient presents with  . Eye Problem    HPI Carol [name] is a 83 y.o. female hx of CAD, aortic valve replacement on coumadin, Hypertension here presenting with right eye blurry vision. Acute onset of right eye blurry vision around 3:30 PM this afternoon. She just finished taking a shower and then she noticed that her right eye was blurry. She immediately went to see ophthalmology and saw Dr. [name], who performed eye exam and noticed nl fundoscopic exam. She was sent for possible temporal arteritis. She has no headaches or temporal pain. Denies hx of stroke. Patient compliant with coumadin and INR was therapeutic 3 weeks ago with no changes in her meds.    The history is provided by the patient.    Past Medical History:  Diagnosis Date  . Aortic stenosis   . Coronary artery disease    Cath 1/13 left main  . Dyspnea   . Hyperlipidemia   . Hypertensive heart disease without CHF   . Hypothyroidism   . Lumbar disc disease   . Lung nodules    Sees Wert, thought to be benign  . Obesity   . Spinal stenosis   . Villous adenoma of rectum     Patient Active Problem List   Diagnosis Date Noted  . Long term (current) use of anticoagulants 07/14/2013  . Chronic diastolic heart failure ([place]) 01/17/2012  . Hyponatremia 01/17/2012  . Atrial fibrillation ([place])   . 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 CAD (coronary artery disease) 12/26/2011  . History of Villous adenoma of rectum   . Hypertensive heart disease without CHF   . Hyperlipidemia   . Hypothyroidism   . Lumbar degenerative disc disease   . Aortic stenosis 11/08/2011  . Personal history of solitary pulmonary nodule 11/07/2011    Past Surgical History:  Procedure Laterality Date  . AORTIC VALVE REPLACEMENT  12/28/2011   Procedure: AORTIC VALVE REPLACEMENT (AVR);  Surgeon: Grace Isaac, MD;  Location: [facility];  Service: Open Heart Surgery;  Laterality: N/A;  . CARDIAC CATHETERIZATION  2013  . CHOLECYSTECTOMY    . CORONARY ARTERY BYPASS GRAFT  12/28/2011   Procedure: CORONARY ARTERY BYPASS GRAFTING (CABG);  Surgeon: Grace Isaac, MD;  Location: [facility];  Service: Open Heart Surgery;  Laterality: N/A;  . HERNIA REPAIR    . LEFT AND RIGHT HEART CATHETERIZATION WITH CORONARY ANGIOGRAM N/A 12/26/2011   Procedure: LEFT AND RIGHT HEART CATHETERIZATION WITH CORONARY ANGIOGRAM;  Surgeon: Jacolyn Reedy, MD;  Location: [facility] CATH LAB;  Service: Cardiovascular;  Laterality: N/A;  . LUMBAR LAMINECTOMY    . PARTIAL COLECTOMY  2008   Villous adenoma    OB History    No data available       Home Medications    Prior to Admission medications   Medication Sig Start Date End Date Taking? Authorizing Provider  acetaminophen (TYLENOL) 500 MG tablet Take 500 mg by mouth every 6 (six) hours as needed for mild pain.   Yes Historical Provider, MD  ALPRAZolam (XANAX) 0.25 MG tablet Take 0.25 mg by mouth at bedtime. 02/04/16  Yes Historical Provider, MD  atorvastatin (LIPITOR) 20 MG tablet Take 20 mg by mouth at bedtime.     Yes Historical Provider, MD  BESIVANCE 0.6 % SUSP Place 1 drop into the left eye See  admin instructions. Takes 1 drop left eye 4 times daily for 2 days after avastin eye injections 11/23/16  Yes Historical Provider, MD  Calcium Carbonate-Vitamin D (CALCIUM 600 + D PO) Take 1 tablet by mouth every morning.    Yes Historical Provider, MD  docusate sodium (COLACE) 100 MG capsule Take 100 mg by mouth at bedtime.    Yes Historical Provider, MD  gabapentin (NEURONTIN) 100 MG capsule TAKE 2 CAPSULES BY MOUTH 3 TIMES A DAY 06/24/15  Yes Historical Provider, MD  levothyroxine (SYNTHROID, LEVOTHROID) 88 MCG tablet TAKE 1 TABLET BY MOUTH EVERY MORNING ON AN EMPTY STOMACH 06/28/15  Yes Historical Provider, MD  losartan (COZAAR) 100 MG tablet Take 100 mg by mouth daily. 06/19/15  Yes  Historical Provider, MD  metoprolol tartrate (LOPRESSOR) 25 MG tablet Take 25 mg by mouth 2 (two) times daily. 03/02/16  Yes Historical Provider, MD  Multiple Vitamin (MULITIVITAMIN WITH MINERALS) TABS Take 1 tablet by mouth daily.     Yes Historical Provider, MD  spironolactone (ALDACTONE) 25 MG tablet Take 25 mg by mouth daily. 12/12/16  Yes Historical Provider, MD  VESICARE 5 MG tablet Take 5 mg by mouth every other day. 12/05/16  Yes Historical Provider, MD  warfarin (COUMADIN) 1 MG tablet Take 0-2 mg by mouth See admin instructions. Takes 82m only 4 days weekly, along with 563mtabs 03/14/16  Yes Historical Provider, MD  warfarin (COUMADIN) 5 MG tablet Take 5-7 mg by mouth daily. Takes 42m69m days weekly Takes 7mg54ml other days   Yes Historical Provider, MD    Family History Family History  Problem Relation Age of Onset  . Lymphoma Sister   . Cancer Sister   . Cancer Mother   . Heart attack Father   . Heart disease Father     Social History Social History  Substance Use Topics  . Smoking status: Former Smoker    Packs/day: 0.50    Years: 10.00    Types: Cigarettes    Quit date: 12/24/1978  . Smokeless tobacco: Never Used  . Alcohol use Yes     Comment: occ     Allergies   Ibuprofen and Tape   Review of Systems Review of Systems  Eyes: Positive for visual disturbance.  All other systems reviewed and are negative.    Physical Exam Updated Vital Signs BP 186/84   Pulse 68   Temp 97.9 F (36.6 C) (Oral)   Resp 18   Ht '5\' 2"'  (1.575 m)   Wt 140 lb (63.5 kg)   SpO2 98%   BMI 25.61 kg/m   Physical Exam  Constitutional: She is oriented to person, place, and time. She appears well-developed and well-nourished.  HENT:  Head: Normocephalic.  Eyes: EOM are normal.  Pupils dilated. Fundoscopic exam grossly normal. Extra ocular movements intact. No temporal tenderness   Neck: Normal range of motion. Neck supple.  Cardiovascular: Normal rate, regular rhythm and normal  heart sounds.   Pulmonary/Chest: Effort normal and breath sounds normal. No respiratory distress. She has no wheezes. She has no rales.  Abdominal: Soft. Bowel sounds are normal. She exhibits no distension. There is no tenderness.  Musculoskeletal: Normal range of motion.  Neurological: She is alert and oriented to person, place, and time. No cranial nerve deficit. Coordination normal.  CN 2-12 intact, nl strength throughout   Skin: Skin is warm.  Psychiatric: She has a normal mood and affect.  Nursing note and vitals reviewed.    ED Treatments /  Results  Labs (all labs ordered are listed, but only abnormal results are displayed) Labs Reviewed  BASIC METABOLIC PANEL - Abnormal; Notable for the following:       Result Value   Sodium 133 (*)    Chloride 95 (*)    Glucose, Bld 115 (*)    BUN 25 (*)    GFR calc non Af Amer 55 (*)    All other components within normal limits  SEDIMENTATION RATE - Abnormal; Notable for the following:    Sed Rate 40 (*)    All other components within normal limits  CBC WITH DIFFERENTIAL/PLATELET  C-REACTIVE PROTEIN    EKG  EKG Interpretation None       Radiology Ct Head Wo Contrast  Result Date: 01/15/2017 CLINICAL DATA:  Sudden onset blurry vision right eye. EXAM: CT HEAD WITHOUT CONTRAST TECHNIQUE: Contiguous axial images were obtained from the base of the skull through the vertex without intravenous contrast. COMPARISON:  None. FINDINGS: Brain: There is no evidence for acute hemorrhage, hydrocephalus, mass lesion, or abnormal extra-axial fluid collection. No definite CT evidence for acute infarction. Diffuse loss of parenchymal volume is consistent with atrophy. Patchy low attenuation in the deep hemispheric and periventricular white matter is nonspecific, but likely reflects chronic microvascular ischemic demyelination. Vascular: Atherosclerotic calcification is visualized in the carotid arteries. No dense MCA sign. Major dural sinuses are  unremarkable. Skull: No evidence for fracture. No worrisome lytic or sclerotic lesion. Sinuses/Orbits: Scattered opacification of ethmoid air cells noted. Remaining visualized paranasal sinuses are clear. Visualized portions of the globes and intraorbital fat are unremarkable. Other: None. IMPRESSION: 1. No acute intracranial abnormality. 2. Atrophy with chronic small vessel white matter ischemic disease. Electronically Signed   By: Misty Stanley M.D.   On: 01/15/2017 21:34    Procedures Procedures (including critical care time)  Medications Ordered in ED Medications  methylPREDNISolone sodium succinate (SOLU-MEDROL) 125 mg/2 mL injection 60 mg (60 mg Intravenous Given 01/15/17 2328)     Initial Impression / Assessment and Plan / ED Course  I have reviewed the triage vital signs and the nursing notes.  Pertinent labs & imaging results that were available during my care of the patient were reviewed by me and considered in my medical decision making (see chart for details).    LAELLE BRIDGETT is a 83 y.o. female here with R eye blurry vision. Optho saw patient and fundoscopic exam unremarkable. Sent for r/o temporal arteritis but has no headache or temporal tenderness. Will get labs and ESR. Will get CT head to r/o stroke as well. She is on coumadin already and only has unilateral blurry vision so low suspicion for stroke.   11:34 PM Labs showed ESR 40. WBC nl. CT head unremarkable. Called Dr. Nicole Kindred from neurology, who doesn't think she needs further neuro evaluation. I called Dr. [name] from ophtho, who wants 1 mg/kg of solumedrol IV and then start prednisone 60 mg. He wants to get serial CRP to see if she is responding to treatment and will arrange for biopsy. She has appointment tomorrow morning with ophtho.    Final Clinical Impressions(s) / ED Diagnoses   Final diagnoses:  None    New Prescriptions New Prescriptions   No medications on file     Drenda Freeze,  MD 01/15/17 2338

## 2017-01-15 NOTE — ED Notes (Signed)
ED Provider at bedside. 

## 2017-01-15 NOTE — ED Triage Notes (Signed)
Seen at Dr Zenia Resides eye doctor who sent patient to ED for evaluation of loss of vision right eye and temporal arteritis. Eyes dilated at office. Patient denies pain able to see objects with right eye states sees "fuzzy objects.

## 2017-01-15 NOTE — ED Notes (Signed)
Spoke with ED Doctor regarding blood work from Dr [name].

## 2017-01-15 NOTE — Discharge Instructions (Signed)
Take prednisone as prescribed.   See your eye doctor tomorrow morning. Your eye doctor will arrange for biopsy as needed and may adjust your steroids depending on your improvement. Your eye doctor may get serial blood tests.   Return to ER if you have worse blurry vision, headaches, severe eye pain.

## 2017-01-15 NOTE — ED Notes (Signed)
Patient transported to CT 

## 2017-01-15 NOTE — ED Notes (Signed)
Pt returned to room and placed back on monitor

## 2017-01-16 ENCOUNTER — Ambulatory Visit: Payer: Medicare Other | Admitting: Podiatry

## 2017-01-16 ENCOUNTER — Encounter (INDEPENDENT_AMBULATORY_CARE_PROVIDER_SITE_OTHER): Payer: Medicare Other | Admitting: Ophthalmology

## 2017-01-16 DIAGNOSIS — H35033 Hypertensive retinopathy, bilateral: Secondary | ICD-10-CM

## 2017-01-16 DIAGNOSIS — I10 Essential (primary) hypertension: Secondary | ICD-10-CM

## 2017-01-16 DIAGNOSIS — H34812 Central retinal vein occlusion, left eye, with macular edema: Secondary | ICD-10-CM

## 2017-01-16 DIAGNOSIS — H3411 Central retinal artery occlusion, right eye: Secondary | ICD-10-CM

## 2017-01-16 DIAGNOSIS — H43813 Vitreous degeneration, bilateral: Secondary | ICD-10-CM

## 2017-01-16 NOTE — ED Notes (Signed)
Pt verbalized understanding of d/c instructions and has no further questions. Pt is stable, A&Ox4, VSS.  

## 2017-01-17 DIAGNOSIS — I10 Essential (primary) hypertension: Secondary | ICD-10-CM | POA: Diagnosis not present

## 2017-01-17 DIAGNOSIS — E785 Hyperlipidemia, unspecified: Secondary | ICD-10-CM | POA: Diagnosis not present

## 2017-01-17 DIAGNOSIS — I350 Nonrheumatic aortic (valve) stenosis: Secondary | ICD-10-CM | POA: Diagnosis not present

## 2017-01-17 DIAGNOSIS — H34231 Retinal artery branch occlusion, right eye: Secondary | ICD-10-CM | POA: Diagnosis not present

## 2017-01-17 DIAGNOSIS — Z952 Presence of prosthetic heart valve: Secondary | ICD-10-CM | POA: Diagnosis not present

## 2017-01-17 DIAGNOSIS — I2510 Atherosclerotic heart disease of native coronary artery without angina pectoris: Secondary | ICD-10-CM | POA: Diagnosis not present

## 2017-01-17 DIAGNOSIS — I4891 Unspecified atrial fibrillation: Secondary | ICD-10-CM | POA: Diagnosis not present

## 2017-01-17 DIAGNOSIS — Z951 Presence of aortocoronary bypass graft: Secondary | ICD-10-CM | POA: Diagnosis not present

## 2017-01-17 DIAGNOSIS — I480 Paroxysmal atrial fibrillation: Secondary | ICD-10-CM | POA: Diagnosis not present

## 2017-01-17 DIAGNOSIS — E039 Hypothyroidism, unspecified: Secondary | ICD-10-CM | POA: Diagnosis not present

## 2017-01-17 DIAGNOSIS — Z7901 Long term (current) use of anticoagulants: Secondary | ICD-10-CM | POA: Diagnosis not present

## 2017-01-21 ENCOUNTER — Other Ambulatory Visit: Payer: Self-pay | Admitting: Cardiology

## 2017-01-21 DIAGNOSIS — H34239 Retinal artery branch occlusion, unspecified eye: Secondary | ICD-10-CM

## 2017-01-22 ENCOUNTER — Ambulatory Visit (HOSPITAL_COMMUNITY)
Admission: RE | Admit: 2017-01-22 | Discharge: 2017-01-22 | Disposition: A | Discharge status: Home / Self Care | Payer: Medicare Other | Source: Ambulatory Visit | Attending: Vascular Surgery | Admitting: Vascular Surgery

## 2017-01-22 DIAGNOSIS — H34231 Retinal artery branch occlusion, right eye: Secondary | ICD-10-CM | POA: Diagnosis not present

## 2017-01-22 DIAGNOSIS — H34239 Retinal artery branch occlusion, unspecified eye: Secondary | ICD-10-CM | POA: Diagnosis not present

## 2017-01-22 LAB — VAS US CAROTID
LEFT ECA DIAS: 1 cm/s
Left CCA dist dias: 12 cm/s
Left CCA dist sys: 76 cm/s
Left CCA prox dias: 11 cm/s
Left CCA prox sys: 74 cm/s
Left ICA dist dias: -12 cm/s
Left ICA dist sys: -56 cm/s
Left ICA prox dias: -12 cm/s
Left ICA prox sys: -86 cm/s
RIGHT CCA MID DIAS: 0 cm/s
RIGHT ECA DIAS: 1 cm/s
Right CCA prox dias: 0 cm/s
Right CCA prox sys: 49 cm/s
Right cca dist sys: -54 cm/s

## 2017-01-24 ENCOUNTER — Ambulatory Visit: Payer: Medicare Other | Admitting: Neurology

## 2017-01-28 DIAGNOSIS — I480 Paroxysmal atrial fibrillation: Secondary | ICD-10-CM | POA: Diagnosis not present

## 2017-01-28 DIAGNOSIS — Z952 Presence of prosthetic heart valve: Secondary | ICD-10-CM | POA: Diagnosis not present

## 2017-01-28 DIAGNOSIS — E039 Hypothyroidism, unspecified: Secondary | ICD-10-CM | POA: Diagnosis not present

## 2017-01-28 DIAGNOSIS — H34231 Retinal artery branch occlusion, right eye: Secondary | ICD-10-CM | POA: Diagnosis not present

## 2017-01-28 DIAGNOSIS — I2510 Atherosclerotic heart disease of native coronary artery without angina pectoris: Secondary | ICD-10-CM | POA: Diagnosis not present

## 2017-01-28 DIAGNOSIS — Z951 Presence of aortocoronary bypass graft: Secondary | ICD-10-CM | POA: Diagnosis not present

## 2017-01-28 DIAGNOSIS — I10 Essential (primary) hypertension: Secondary | ICD-10-CM | POA: Diagnosis not present

## 2017-01-28 DIAGNOSIS — E785 Hyperlipidemia, unspecified: Secondary | ICD-10-CM | POA: Diagnosis not present

## 2017-01-28 DIAGNOSIS — I350 Nonrheumatic aortic (valve) stenosis: Secondary | ICD-10-CM | POA: Diagnosis not present

## 2017-01-28 DIAGNOSIS — Z7901 Long term (current) use of anticoagulants: Secondary | ICD-10-CM | POA: Diagnosis not present

## 2017-01-29 DIAGNOSIS — H3411 Central retinal artery occlusion, right eye: Secondary | ICD-10-CM | POA: Diagnosis not present

## 2017-01-29 DIAGNOSIS — H348122 Central retinal vein occlusion, left eye, stable: Secondary | ICD-10-CM | POA: Diagnosis not present

## 2017-01-29 DIAGNOSIS — H47011 Ischemic optic neuropathy, right eye: Secondary | ICD-10-CM | POA: Diagnosis not present

## 2017-01-29 DIAGNOSIS — H3581 Retinal edema: Secondary | ICD-10-CM | POA: Diagnosis not present

## 2017-02-01 ENCOUNTER — Encounter: Payer: Self-pay | Admitting: Vascular Surgery

## 2017-02-04 ENCOUNTER — Encounter: Payer: Self-pay | Admitting: Vascular Surgery

## 2017-02-05 ENCOUNTER — Ambulatory Visit (INDEPENDENT_AMBULATORY_CARE_PROVIDER_SITE_OTHER): Payer: Medicare Other | Admitting: Neurology

## 2017-02-05 ENCOUNTER — Telehealth: Payer: Self-pay | Admitting: Neurology

## 2017-02-05 ENCOUNTER — Encounter: Payer: Self-pay | Admitting: Neurology

## 2017-02-05 VITALS — BP 182/75 | HR 64 | Ht 62.0 in | Wt 146.0 lb

## 2017-02-05 DIAGNOSIS — H3411 Central retinal artery occlusion, right eye: Secondary | ICD-10-CM | POA: Diagnosis not present

## 2017-02-05 DIAGNOSIS — G629 Polyneuropathy, unspecified: Secondary | ICD-10-CM | POA: Diagnosis not present

## 2017-02-05 DIAGNOSIS — E538 Deficiency of other specified B group vitamins: Secondary | ICD-10-CM

## 2017-02-05 HISTORY — DX: Central retinal artery occlusion, right eye: H34.11

## 2017-02-05 MED ORDER — PREDNISONE 5 MG PO TABS
ORAL_TABLET | ORAL | 0 refills | Status: DC
Start: 2017-02-05 — End: 2018-05-14

## 2017-02-05 NOTE — Progress Notes (Signed)
Reason for visit: Visual loss, right eye  Referring physician: Dr. [name] is a 83 y.o. female  History of present illness:  Carol [name] is an 83 year old right-handed white female with a history of an aortic valve replacement on Coumadin. The patient has been followed for a left central retinal venous occlusion, she has been getting treatments for this through Dr. [name]. On 01/15/2017, the patient noted onset of loss of vision involving the right eye. The patient denies any pain in the right eye associated with visual loss. The visual loss was not apparently suddenly, but occurred over several hours that day. She initially noted a film over the eye, and within several hours she had no vision in the eye. She went for an evaluation, she was seen by Dr. [name] and was noted to have a central retinal artery occlusion on the right. The patient was sent to the emergency room, CT scan of the brain was done and was unremarkable, a sedimentation rate was slightly elevated at 40. The patient had a C-reactive protein that was well within normal limits. The patient apparently has had a carotid Doppler study, the results of this study are not available to me. The patient will be seen through the vascular surgery office in the near future for review of this study. The patient denies any other symptoms such as headache, jaw discomfort, malaise, weight loss, night sweats, or any other problems with numbness or weakness of the face, arms, or legs. The patient denies any change in balance, she has had a chronic balance issue associated with back problems and numbness of the feet. The patient walks with a walker. She denies any change in bowel or bladder control. She has not reported any dizziness. She is sent to this office for further evaluation. She was placed on prednisone at 60 mg daily beginning on 01/15/2017. The patient is now on 20 mg a day, she has been on prednisone therapy for about  3 weeks. She has not undergone a temporal artery biopsy to date.  Past Medical History:  Diagnosis Date  . Aortic stenosis   . Coronary artery disease    Cath 1/13 left main  . 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 Dyspnea   . Hyperlipidemia   . Hypertensive heart disease without CHF   . Hypothyroidism   . Lumbar disc disease   . Lung nodules    Sees Wert, thought to be benign  . Obesity   . Spinal stenosis   . Villous adenoma of rectum     Past Surgical History:  Procedure Laterality Date  . AORTIC VALVE REPLACEMENT  12/28/2011   Procedure: AORTIC VALVE REPLACEMENT (AVR);  Surgeon: Grace Isaac, MD;  Location: [place];  Service: Open Heart Surgery;  Laterality: N/A;  . CARDIAC CATHETERIZATION  2013  . CHOLECYSTECTOMY    . CORONARY ARTERY BYPASS GRAFT  12/28/2011   Procedure: CORONARY ARTERY BYPASS GRAFTING (CABG);  Surgeon: Grace Isaac, MD;  Location: [place];  Service: Open Heart Surgery;  Laterality: N/A;  . HERNIA REPAIR    . LEFT AND RIGHT HEART CATHETERIZATION WITH CORONARY ANGIOGRAM N/A 12/26/2011   Procedure: LEFT AND RIGHT HEART CATHETERIZATION WITH CORONARY ANGIOGRAM;  Surgeon: Jacolyn Reedy, MD;  Location: [facility] CATH LAB;  Service: Cardiovascular;  Laterality: N/A;  . LUMBAR LAMINECTOMY    . PARTIAL COLECTOMY  2008   Villous adenoma    Family History  Problem Relation  Age of Onset  . Lymphoma Sister   . Cancer Sister   . Cancer Mother   . Heart attack Father   . Heart disease Father     Social history:  reports that she quit smoking about 38 years ago. Her smoking use included Cigarettes. She has a 5.00 pack-year smoking history. She has never used smokeless tobacco. She reports that she does not drink alcohol or use drugs.  Medications:  Prior to Admission medications   Medication Sig Start Date End Date Taking? Authorizing Provider  acetaminophen (TYLENOL) 500 MG tablet Take 500 mg by mouth every 8 (eight) hours as needed for mild  pain.    Yes Historical Provider, MD  ALPRAZolam (XANAX) 0.25 MG tablet Take 0.25 mg by mouth at bedtime. 02/04/16  Yes Historical Provider, MD  atorvastatin (LIPITOR) 20 MG tablet Take 20 mg by mouth at bedtime.     Yes Historical Provider, MD  BESIVANCE 0.6 % SUSP Place 1 drop into the left eye See admin instructions. Takes 1 drop left eye 4 times daily for 2 days after avastin eye injections 11/23/16  Yes Historical Provider, MD  Calcium Carbonate-Vitamin D (CALCIUM 600 + D PO) Take 1 tablet by mouth every morning.    Yes Historical Provider, MD  docusate sodium (COLACE) 100 MG capsule Take 240 mg by mouth at bedtime.    Yes Historical Provider, MD  gabapentin (NEURONTIN) 100 MG capsule TAKE 2 CAPSULES BY MOUTH 3 TIMES A DAY 06/24/15  Yes Historical Provider, MD  levothyroxine (SYNTHROID, LEVOTHROID) 88 MCG tablet TAKE 1 TABLET BY MOUTH EVERY MORNING ON AN EMPTY STOMACH 06/28/15  Yes Historical Provider, MD  losartan (COZAAR) 100 MG tablet Take 100 mg by mouth daily. 06/19/15  Yes Historical Provider, MD  metoprolol tartrate (LOPRESSOR) 25 MG tablet Take 25 mg by mouth 2 (two) times daily. 03/02/16  Yes Historical Provider, MD  Multiple Vitamin (MULITIVITAMIN WITH MINERALS) TABS Take 1 tablet by mouth daily.     Yes Historical Provider, MD  Multiple Vitamins-Minerals (PRESERVISION AREDS PO) Take 1 tablet by mouth daily.   Yes Historical Provider, MD  polyethylene glycol (MIRALAX / GLYCOLAX) packet Take 17 g by mouth daily as needed.   Yes Historical Provider, MD  Probiotic Product (PROBIOTIC PO) Take 1 capsule by mouth daily.   Yes Historical Provider, MD  spironolactone (ALDACTONE) 25 MG tablet Take 25 mg by mouth daily. 12/12/16  Yes Historical Provider, MD  VESICARE 5 MG tablet Take 5 mg by mouth every other day. 12/05/16  Yes Historical Provider, MD  warfarin (COUMADIN) 1 MG tablet Take 0-2 mg by mouth See admin instructions. Takes '2mg'$  only 4 days weekly, along with '5mg'$  tabs 03/14/16  Yes Historical  Provider, MD  warfarin (COUMADIN) 5 MG tablet Take 5-7 mg by mouth daily. Takes '5mg'$  3 days weekly Takes '7mg'$  all other days   Yes Historical Provider, MD  predniSONE (DELTASONE) 5 MG tablet Take 3 tablets daily for 5 days, then take 2 tablets daily for 5 days, then one tablet daily for 5 days, then stop 02/05/17   Kathrynn Ducking, MD      Allergies  Allergen Reactions  . Ibuprofen Other (See Comments)    Hx of GI bleed  . Ace Inhibitors     Cough  . Doxycycline Nausea Only  . Hydrocodone Nausea Only  . Tape Rash    Reaction:Blisters (can use paper tape)    ROS:  Out of a complete 14 system review  of symptoms, the patient complains only of the following symptoms, and all other reviewed systems are negative.  Loss of vision Easy bruising Urination problems  Blood pressure (!) 182/75, pulse 64, height '5\' 2"'$  (1.575 m), weight 146 lb (66.2 kg), SpO2 97 %.  Physical Exam  General: The patient is alert and cooperative at the time of the examination.  Eyes: Pupils are equal, round, and reactive to light. Discs are flat on the right, on the left there is edema and hemorrhage around the disc.  Neck: The neck is supple, no carotid bruits are noted. Some radiation of the cardiac murmur is noted in both carotid arteries.  Respiratory: The respiratory examination is clear.  Cardiovascular: The cardiovascular examination reveals a regular rate and rhythm, a grade II/VI systolic ejection murmur at the aortic area as noted associated with a valvular click.  Skin: Extremities are with 1+ edema at the ankles bilaterally.  Neurologic Exam  Mental status: The patient is alert and oriented x 3 at the time of the examination. The patient has apparent normal recent and remote memory, with an apparently normal attention span and concentration ability.  Cranial nerves: Facial symmetry is present. There is good sensation of the face to pinprick and soft touch bilaterally. The strength of the  facial muscles and the muscles to head turning and shoulder shrug are normal bilaterally. Speech is well enunciated, no aphasia or dysarthria is noted. Extraocular movements are full. Visual fields are full with the left eye, the patient is still able to count fingers to some degree with the right eye. The tongue is midline, and the patient has symmetric elevation of the soft palate. No obvious hearing deficits are noted.  Motor: The motor testing reveals 5 over 5 strength of all 4 extremities. Good symmetric motor tone is noted throughout.  Sensory: Sensory testing is intact to pinprick, soft touch, vibration sensation, and position sense on the upper extremities. With the lower extremities, there is a stocking pattern pinprick sensory deficit up to the knees bilaterally. Vibration sensation and position sensation are significantly impaired in the feet bilaterally. No evidence of extinction is noted.  Coordination: Cerebellar testing reveals good finger-nose-finger and heel-to-shin bilaterally.  Gait and station: Gait is wide-based, unsteady. The patient walks with a walker. Tandem gait was not attempted. Romberg is negative. No drift is seen.  Reflexes: Deep tendon reflexes are symmetric, but are depressed bilaterally. Toes are downgoing bilaterally.   Assessment/Plan:  1. Central retinal artery occlusion, OD  2. Central retinal venous occlusion, OS  3. Peripheral neuropathy by clinical examination  4. Gait disorder  The patient has been placed on prednisone, she has been on this medication for 3 weeks. The patient has no clinical history that would suggest temporal arteritis such as headache, jaw discomfort, or any symptoms of polymyalgia rheumatica. Sedimentation rate was minimally elevated, but the C-reactive protein was well within normal limits. I suspect the patient does not have temporal arteritis. I would initiate a prednisone taper at this time, a prescription for the 5 mg tablets  was given. The patient will be sent for further blood work evaluation at this time. She will follow-up in 3-4 months. I have indicated that I do not believe that her vision in the right eye will return. She has a mechanical aortic valve and likely cannot have MRI of the brain to evaluate for any signs of embolic subclinical strokes. In the future, a full workup for a hypercoagulable state may be considered.  Jill Alexanders MD 02/05/2017 12:18 PM  [facility] [address] [place], [place] [address]  Phone [phone] Fax [phone]

## 2017-02-05 NOTE — Telephone Encounter (Signed)
Patients daughter [name] called office to notify Dr. Jannifer Franklin patient has an appointment this afternoon with Dr. Excell Seltzer at [facility] for a consult to discuss a possible biopsy for temporal arteritis.  Patients daughter would like to know if Dr. Jannifer Franklin feels patient still needs go to this consult.  Please call

## 2017-02-05 NOTE — Telephone Encounter (Signed)
I called the daughter. I indicated that I do not believe this patient has temporal arteritis, I do not believe that a temporal artery biopsy should be done. Also, the patient has been on prednisone in high dose for 3 weeks, even if she had temporal arteritis the chances of getting a positive biopsy would be very low by this time.

## 2017-02-05 NOTE — Telephone Encounter (Signed)
Dr Jannifer Franklin- please advise

## 2017-02-08 ENCOUNTER — Ambulatory Visit
Admission: RE | Admit: 2017-02-08 | Discharge: 2017-02-08 | Disposition: A | Discharge status: Home / Self Care | Payer: Medicare Other | Source: Ambulatory Visit | Attending: Family Medicine | Admitting: Family Medicine

## 2017-02-08 DIAGNOSIS — Z1231 Encounter for screening mammogram for malignant neoplasm of breast: Secondary | ICD-10-CM | POA: Diagnosis not present

## 2017-02-08 LAB — MULTIPLE MYELOMA PANEL, SERUM
Albumin SerPl Elph-Mcnc: 3.6 g/dL (ref 2.9–4.4)
Albumin/Glob SerPl: 1.4 (ref 0.7–1.7)
Alpha 1: 0.4 g/dL (ref 0.0–0.4)
Alpha2 Glob SerPl Elph-Mcnc: 0.9 g/dL (ref 0.4–1.0)
B-Globulin SerPl Elph-Mcnc: 0.8 g/dL (ref 0.7–1.3)
Gamma Glob SerPl Elph-Mcnc: 0.6 g/dL (ref 0.4–1.8)
Globulin, Total: 2.6 g/dL (ref 2.2–3.9)
IgA/Immunoglobulin A, Serum: 23 mg/dL — ABNORMAL LOW (ref 64–422)
IgG (Immunoglobin G), Serum: 550 mg/dL — ABNORMAL LOW (ref 700–1600)
IgM (Immunoglobulin M), Srm: 26 mg/dL (ref 26–217)
Total Protein: 6.2 g/dL (ref 6.0–8.5)

## 2017-02-08 LAB — ANGIOTENSIN CONVERTING ENZYME: Angio Convert Enzyme: 24 U/L (ref 14–82)

## 2017-02-08 LAB — PAN-ANCA
ANCA Proteinase 3: <3.5 U/mL (ref 0.0–3.5)
Atypical pANCA: <1:20 {titer}
C-ANCA: <1:20 {titer}
Myeloperoxidase Ab: <9 U/mL (ref 0.0–9.0)
P-ANCA: <1:20 {titer}

## 2017-02-08 LAB — ANA W/REFLEX: Anti Nuclear Antibody(ANA): NEGATIVE

## 2017-02-08 LAB — B. BURGDORFI ANTIBODIES: Lyme IgG/IgM Ab: <0.91 {ISR} (ref 0.00–0.90)

## 2017-02-08 LAB — VITAMIN B12: Vitamin B-12: 1134 pg/mL (ref 232–1245)

## 2017-02-11 ENCOUNTER — Encounter: Payer: Self-pay | Admitting: Vascular Surgery

## 2017-02-11 ENCOUNTER — Telehealth: Payer: Self-pay | Admitting: *Deleted

## 2017-02-11 ENCOUNTER — Ambulatory Visit (INDEPENDENT_AMBULATORY_CARE_PROVIDER_SITE_OTHER): Payer: Medicare Other | Admitting: Vascular Surgery

## 2017-02-11 VITALS — BP 191/77 | HR 70 | Temp 96.7°F | Resp 24 | Ht 63.0 in | Wt 145.0 lb

## 2017-02-11 DIAGNOSIS — J209 Acute bronchitis, unspecified: Secondary | ICD-10-CM | POA: Diagnosis not present

## 2017-02-11 DIAGNOSIS — M961 Postlaminectomy syndrome, not elsewhere classified: Secondary | ICD-10-CM | POA: Diagnosis not present

## 2017-02-11 DIAGNOSIS — N3281 Overactive bladder: Secondary | ICD-10-CM | POA: Diagnosis not present

## 2017-02-11 DIAGNOSIS — I779 Disorder of arteries and arterioles, unspecified: Secondary | ICD-10-CM

## 2017-02-11 DIAGNOSIS — I739 Peripheral vascular disease, unspecified: Principal | ICD-10-CM

## 2017-02-11 NOTE — Progress Notes (Signed)
New Carotid Patient  Referred by:  Darcus Austin, MD [address], [place] [address]  Reason for referral: B mild carotid stenosis  History of Present Illness  Carol [name] is a 83 y.o. (Mar 23, 1928) female who presents with chief complaint: R eye vision loss.  This patient recently was diagnosed with R retinal artery occlusion.  She previously has had a L retinal vein occlusion.  Previous carotid studies demonstrated: RICA <32% stenosis, LICA <35% stenosis.  Patient has no history of TIA or stroke symptom.  The patient has the noted history of vision loss with no vision in R and minimal in L at this point.  The patient has never had facial drooping or hemiplegia.  The patient has never had receptive or expressive aphasia.   The patient's risks factors for carotid disease include: HLD and prior smoking.  The patient has known history of mechanical AVR on coumadin.  Past Medical History:  Diagnosis Date  . Aortic stenosis   . Coronary artery disease    Cath 1/13 left main  . 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 Dyspnea   . Hyperlipidemia   . Hypertensive heart disease without CHF   . Hypothyroidism   . Lumbar disc disease   . Lung nodules    Sees Wert, thought to be benign  . Obesity   . Spinal stenosis   . Villous adenoma of rectum     Past Surgical History:  Procedure Laterality Date  . AORTIC VALVE REPLACEMENT  12/28/2011   Procedure: AORTIC VALVE REPLACEMENT (AVR);  Surgeon: Grace Isaac, MD;  Location: [place];  Service: Open Heart Surgery;  Laterality: N/A;  . CARDIAC CATHETERIZATION  2013  . CHOLECYSTECTOMY    . CORONARY ARTERY BYPASS GRAFT  12/28/2011   Procedure: CORONARY ARTERY BYPASS GRAFTING (CABG);  Surgeon: Grace Isaac, MD;  Location: [facility];  Service: Open Heart Surgery;  Laterality: N/A;  . HERNIA REPAIR    . LEFT AND RIGHT HEART CATHETERIZATION WITH CORONARY ANGIOGRAM N/A 12/26/2011   Procedure: LEFT AND  RIGHT HEART CATHETERIZATION WITH CORONARY ANGIOGRAM;  Surgeon: Jacolyn Reedy, MD;  Location: [facility] CATH LAB;  Service: Cardiovascular;  Laterality: N/A;  . LUMBAR LAMINECTOMY    . PARTIAL COLECTOMY  2008   Villous adenoma    Social History   Social History  . Marital status: Married    Spouse name: N/A  . Number of children: 4  . Years of education: 12   Occupational History  . retired Scientist, clinical (histocompatibility and immunogenetics)    Social History Main Topics  . Smoking status: Former Smoker    Packs/day: 0.50    Years: 10.00    Types: Cigarettes    Quit date: 12/24/1978  . Smokeless tobacco: Never Used  . Alcohol use No  . Drug use: No  . Sexual activity: Yes   Other Topics Concern  . Not on file   Social History Narrative   Lives at [place]   Phone [phone]   [phone]   Caffeine use: Drinks coffee- 2 cups per day at the most per pt     Family History  Problem Relation Age of Onset  . Lymphoma Sister   . Cancer Sister   . Breast cancer Sister   . Cancer Mother   . Heart attack Father   . Heart disease Father     Current Outpatient Prescriptions  Medication Sig Dispense Refill  . acetaminophen (  TYLENOL) 500 MG tablet Take 500 mg by mouth every 8 (eight) hours as needed for mild pain.     Marland Kitchen ALPRAZolam (XANAX) 0.25 MG tablet Take 0.25 mg by mouth at bedtime.  1  . atorvastatin (LIPITOR) 20 MG tablet Take 20 mg by mouth at bedtime.      Marland Kitchen BESIVANCE 0.6 % SUSP Place 1 drop into the left eye See admin instructions. Takes 1 drop left eye 4 times daily for 2 days after avastin eye injections  11  . Calcium Carbonate-Vitamin D (CALCIUM 600 + D PO) Take 1 tablet by mouth every morning.     . docusate sodium (COLACE) 100 MG capsule Take 240 mg by mouth at bedtime.     . gabapentin (NEURONTIN) 100 MG capsule TAKE 2 CAPSULES BY MOUTH 3 TIMES A DAY  0  . levothyroxine (SYNTHROID, LEVOTHROID) 88 MCG tablet TAKE 1 TABLET BY MOUTH EVERY MORNING ON AN EMPTY STOMACH  2  . losartan (COZAAR)  100 MG tablet Take 100 mg by mouth daily.  4  . metoprolol tartrate (LOPRESSOR) 25 MG tablet Take 25 mg by mouth 2 (two) times daily.  4  . Multiple Vitamin (MULITIVITAMIN WITH MINERALS) TABS Take 1 tablet by mouth daily.      . Multiple Vitamins-Minerals (PRESERVISION AREDS PO) Take 1 tablet by mouth daily.    . polyethylene glycol (MIRALAX / GLYCOLAX) packet Take 17 g by mouth daily as needed.    . predniSONE (DELTASONE) 5 MG tablet Take 3 tablets daily for 5 days, then take 2 tablets daily for 5 days, then one tablet daily for 5 days, then stop 35 tablet 0  . Probiotic Product (PROBIOTIC PO) Take 1 capsule by mouth daily.    Marland Kitchen spironolactone (ALDACTONE) 25 MG tablet Take 25 mg by mouth daily.  3  . VESICARE 5 MG tablet Take 5 mg by mouth every other day.  0  . warfarin (COUMADIN) 1 MG tablet Take 0-2 mg by mouth See admin instructions. Takes '2mg'$  only 4 days weekly, along with '5mg'$  tabs  0  . warfarin (COUMADIN) 5 MG tablet Take 5-7 mg by mouth daily. Takes '5mg'$  3 days weekly Takes '7mg'$  all other days     No current facility-administered medications for this visit.     Allergies  Allergen Reactions  . Ibuprofen Other (See Comments)    Hx of GI bleed  . Ace Inhibitors     Cough  . Doxycycline Nausea Only  . Hydrocodone Nausea Only  . Tape Rash    Reaction:Blisters (can use paper tape)     REVIEW OF SYSTEMS:   Cardiac:  positive for: no symptoms, negative for: Chest pain or chest pressure, Shortness of breath upon exertion and Shortness of breath when lying flat,   Vascular:  positive for: no symptoms,  negative for: Pain in calf, thigh, or hip brought on by ambulation, Pain in feet at night that wakes you up from your sleep, Blood clot in your veins and Leg swelling  Pulmonary:  positive for: no symptoms,  negative for: Oxygen at home, Productive cough and Wheezing  Neurologic:  positive for: permanent vision loss in both eyes, negative for: Sudden weakness in arms or legs,  Sudden numbness in arms or legs, Sudden onset of difficulty speaking or slurred speech, Temporary loss of vision in one eye and Problems with dizziness  Gastrointestinal:  positive for: no symptoms, negative for: Blood in stool and Vomited blood  Genitourinary:  positive for:  no symptoms, negative for: Burning when urinating and Blood in urine  Psychiatric:  positive for: no symptoms,  negative for: Major depression  Hematologic:  positive for: no symptoms,  negative for: negative for: Bleeding problems and Problems with blood clotting too easily  Dermatologic:  positive for: no symptoms, negative for: Rashes or ulcers  Constitutional:  positive for: no symptoms, negative for: Fever or chills  Ear/Nose/Throat:  positive for: no symptoms, negative for: Change in hearing, Nose bleeds and Sore throat  Musculoskeletal:  positive for: no symptoms, negative for: Back pain, Joint pain and Muscle pain   For VQI Use Only  PRE-ADM LIVING: Home  AMB STATUS: Ambulatory  CAD Sx: None  PRIOR CHF: None  STRESS TEST: No   Physical Examination  Vitals:   02/11/17 1017 02/11/17 1020  BP: (!) 166/70 (!) 191/77  Pulse: 72 70  Resp:  (!) 24  Temp: (!) 96.7 F (35.9 C)   TempSrc: Oral   SpO2:  95%  Weight: 145 lb (65.8 kg)   Height: '5\' 3"'$  (1.6 m)     Body mass index is 25.69 kg/m.  General: Alert, O x 3, WD,NAD  Head: /AT,   Ear/Nose/Throat: Hearing grossly intact, nares without erythema or drainage, oropharynx without Erythema or Exudate , Mallampati score: 3,   Eyes: limited pupillary reflex B, B post-surgical lense changes, both eye track consensually,   Neck: Supple, mid-line trachea,    Pulmonary: Sym exp, good B air movt,CTA B  Cardiac: RRR, Nl S1, S2, mech valve can be ausc, No rubs, No S3,S4  Vascular: Vessel Right Left  Radial Palpable Palpable  Brachial Palpable Palpable  Carotid Palpable, No Bruit Palpable, No Bruit  Aorta Not palpable N/A    Femoral Palpable Palpable  Popliteal Not palpable Not palpable  PT Not palpable Not palpable  DP Faintly palpable Faintly palpable   Gastrointestinal: soft, non-distended, non-tender to palpation, No guarding or rebound, no HSM, no masses, no CVAT B, No palpable prominent aortic pulse,    Musculoskeletal: M/S 5/5 throughout  , Extremities without ischemic changes  , No edema present, Varicosities present: B, No LDS present  Neurologic: CN 2-12 intact except for optic nerve as noted, Pain and light touch intact in extremities , Motor exam as listed above  Psychiatric: Judgement intact, Mood & affect appropriate for pt's clinical situation  Dermatologic: See M/S exam for extremity exam, No rashes otherwise noted  Lymph : Palpable lymph nodes: None   Non-Invasive Vascular Imaging  CAROTID DUPLEX (Date: 01/22/17):   R ICA stenosis: 1-39%  R VA: patent and antegrade  L ICA stenosis: 1-39%  L VA: patent and antegrade   Outside Studies/Documentation 10 pages of outside documents were reviewed including: outside ED records.   Medical Decision Making  TELIAH BUFFALO is a 83 y.o. female who presents with: R retinal artery occlusion, L retinal vein occlusion   Pt has minimal disease on carotid duplex B.  While it is possible that CTA Neck might image a plaque in either carotid, I suspect this to be of low yield.  As there isn't any calcification imaged, I don't anticipate a higher degree of disease burden in either carotid artery.  NASCET doesn't recommend CEA for <50% disease.  I doubt the CTA is going to find >50% disease on either side.  I discussed this with the patient and she declines CTA Neck.  I would manage her medically as currently. The patient is currently on a statin: Lipitor.  The patient is currently not on an anti-platelet due to bleeding risks related to use of an anticoagulant.  Thank you for allowing [place] to participate in this patient's  care.   Adele Barthel, MD, FACS [facility] Office: [phone] Pager: [phone]  02/11/2017, 1:33 PM

## 2017-02-11 NOTE — Telephone Encounter (Signed)
-----   Message from Kathrynn Ducking, MD sent at 02/10/2017  5:38 PM EST -----   The blood work results are unremarkable. Please call the patient.  ----- Message ----- From: Lavone Neri Lab Results In Sent: 02/06/2017   7:42 AM To: Kathrynn Ducking, MD

## 2017-02-11 NOTE — Telephone Encounter (Signed)
Called and spoke to pt about labs per Dr Jannifer Franklin note. Pt verbalized understanding.  She requested labs be forwarded back to referring, Dr [name]. Sent via EPIC.

## 2017-02-13 ENCOUNTER — Encounter (INDEPENDENT_AMBULATORY_CARE_PROVIDER_SITE_OTHER): Payer: Medicare Other | Admitting: Ophthalmology

## 2017-02-13 DIAGNOSIS — I10 Essential (primary) hypertension: Secondary | ICD-10-CM | POA: Diagnosis not present

## 2017-02-13 DIAGNOSIS — H35033 Hypertensive retinopathy, bilateral: Secondary | ICD-10-CM | POA: Diagnosis not present

## 2017-02-13 DIAGNOSIS — H3411 Central retinal artery occlusion, right eye: Secondary | ICD-10-CM

## 2017-02-13 DIAGNOSIS — H34812 Central retinal vein occlusion, left eye, with macular edema: Secondary | ICD-10-CM

## 2017-02-13 DIAGNOSIS — H43813 Vitreous degeneration, bilateral: Secondary | ICD-10-CM | POA: Diagnosis not present

## 2017-02-14 DIAGNOSIS — Z7901 Long term (current) use of anticoagulants: Secondary | ICD-10-CM | POA: Diagnosis not present

## 2017-02-14 DIAGNOSIS — I4891 Unspecified atrial fibrillation: Secondary | ICD-10-CM | POA: Diagnosis not present

## 2017-02-19 ENCOUNTER — Ambulatory Visit
Admission: RE | Admit: 2017-02-19 | Discharge: 2017-02-19 | Disposition: A | Discharge status: Home / Self Care | Payer: Medicare Other | Source: Ambulatory Visit | Attending: Family Medicine | Admitting: Family Medicine

## 2017-02-19 ENCOUNTER — Other Ambulatory Visit: Payer: Self-pay | Admitting: Family Medicine

## 2017-02-19 DIAGNOSIS — I480 Paroxysmal atrial fibrillation: Secondary | ICD-10-CM | POA: Diagnosis not present

## 2017-02-19 DIAGNOSIS — Z7901 Long term (current) use of anticoagulants: Secondary | ICD-10-CM | POA: Diagnosis not present

## 2017-02-19 DIAGNOSIS — R410 Disorientation, unspecified: Secondary | ICD-10-CM

## 2017-02-20 DIAGNOSIS — J209 Acute bronchitis, unspecified: Secondary | ICD-10-CM | POA: Diagnosis not present

## 2017-02-20 DIAGNOSIS — R410 Disorientation, unspecified: Secondary | ICD-10-CM | POA: Diagnosis not present

## 2017-02-20 DIAGNOSIS — E871 Hypo-osmolality and hyponatremia: Secondary | ICD-10-CM | POA: Diagnosis not present

## 2017-02-22 DIAGNOSIS — E871 Hypo-osmolality and hyponatremia: Secondary | ICD-10-CM | POA: Diagnosis not present

## 2017-02-28 DIAGNOSIS — R278 Other lack of coordination: Secondary | ICD-10-CM | POA: Diagnosis not present

## 2017-02-28 DIAGNOSIS — R2681 Unsteadiness on feet: Secondary | ICD-10-CM | POA: Diagnosis not present

## 2017-02-28 DIAGNOSIS — H5410 Blindness, one eye, low vision other eye, unspecified eyes: Secondary | ICD-10-CM | POA: Diagnosis not present

## 2017-02-28 DIAGNOSIS — M545 Low back pain: Secondary | ICD-10-CM | POA: Diagnosis not present

## 2017-03-06 DIAGNOSIS — R2681 Unsteadiness on feet: Secondary | ICD-10-CM | POA: Diagnosis not present

## 2017-03-06 DIAGNOSIS — R278 Other lack of coordination: Secondary | ICD-10-CM | POA: Diagnosis not present

## 2017-03-06 DIAGNOSIS — H5410 Blindness, one eye, low vision other eye, unspecified eyes: Secondary | ICD-10-CM | POA: Diagnosis not present

## 2017-03-06 DIAGNOSIS — M545 Low back pain: Secondary | ICD-10-CM | POA: Diagnosis not present

## 2017-03-07 DIAGNOSIS — I4891 Unspecified atrial fibrillation: Secondary | ICD-10-CM | POA: Diagnosis not present

## 2017-03-07 DIAGNOSIS — E871 Hypo-osmolality and hyponatremia: Secondary | ICD-10-CM | POA: Diagnosis not present

## 2017-03-07 DIAGNOSIS — Z7901 Long term (current) use of anticoagulants: Secondary | ICD-10-CM | POA: Diagnosis not present

## 2017-03-13 DIAGNOSIS — H5410 Blindness, one eye, low vision other eye, unspecified eyes: Secondary | ICD-10-CM | POA: Diagnosis not present

## 2017-03-13 DIAGNOSIS — R2681 Unsteadiness on feet: Secondary | ICD-10-CM | POA: Diagnosis not present

## 2017-03-13 DIAGNOSIS — R278 Other lack of coordination: Secondary | ICD-10-CM | POA: Diagnosis not present

## 2017-03-13 DIAGNOSIS — M545 Low back pain: Secondary | ICD-10-CM | POA: Diagnosis not present

## 2017-03-14 ENCOUNTER — Encounter (INDEPENDENT_AMBULATORY_CARE_PROVIDER_SITE_OTHER): Payer: Medicare Other | Admitting: Ophthalmology

## 2017-03-14 DIAGNOSIS — I10 Essential (primary) hypertension: Secondary | ICD-10-CM | POA: Diagnosis not present

## 2017-03-14 DIAGNOSIS — H34812 Central retinal vein occlusion, left eye, with macular edema: Secondary | ICD-10-CM

## 2017-03-14 DIAGNOSIS — H35033 Hypertensive retinopathy, bilateral: Secondary | ICD-10-CM | POA: Diagnosis not present

## 2017-03-14 DIAGNOSIS — H3411 Central retinal artery occlusion, right eye: Secondary | ICD-10-CM

## 2017-03-14 DIAGNOSIS — H43813 Vitreous degeneration, bilateral: Secondary | ICD-10-CM | POA: Diagnosis not present

## 2017-03-20 DIAGNOSIS — M545 Low back pain: Secondary | ICD-10-CM | POA: Diagnosis not present

## 2017-03-20 DIAGNOSIS — H5410 Blindness, one eye, low vision other eye, unspecified eyes: Secondary | ICD-10-CM | POA: Diagnosis not present

## 2017-03-20 DIAGNOSIS — R278 Other lack of coordination: Secondary | ICD-10-CM | POA: Diagnosis not present

## 2017-03-20 DIAGNOSIS — R2681 Unsteadiness on feet: Secondary | ICD-10-CM | POA: Diagnosis not present

## 2017-03-27 DIAGNOSIS — M25512 Pain in left shoulder: Secondary | ICD-10-CM | POA: Diagnosis not present

## 2017-03-27 DIAGNOSIS — M545 Low back pain: Secondary | ICD-10-CM | POA: Diagnosis not present

## 2017-03-27 DIAGNOSIS — R2681 Unsteadiness on feet: Secondary | ICD-10-CM | POA: Diagnosis not present

## 2017-03-27 DIAGNOSIS — M6281 Muscle weakness (generalized): Secondary | ICD-10-CM | POA: Diagnosis not present

## 2017-04-08 DIAGNOSIS — E871 Hypo-osmolality and hyponatremia: Secondary | ICD-10-CM | POA: Diagnosis not present

## 2017-04-08 DIAGNOSIS — M25512 Pain in left shoulder: Secondary | ICD-10-CM | POA: Diagnosis not present

## 2017-04-08 DIAGNOSIS — R2681 Unsteadiness on feet: Secondary | ICD-10-CM | POA: Diagnosis not present

## 2017-04-08 DIAGNOSIS — Z7901 Long term (current) use of anticoagulants: Secondary | ICD-10-CM | POA: Diagnosis not present

## 2017-04-08 DIAGNOSIS — M545 Low back pain: Secondary | ICD-10-CM | POA: Diagnosis not present

## 2017-04-08 DIAGNOSIS — M6281 Muscle weakness (generalized): Secondary | ICD-10-CM | POA: Diagnosis not present

## 2017-04-11 ENCOUNTER — Encounter (INDEPENDENT_AMBULATORY_CARE_PROVIDER_SITE_OTHER): Payer: Medicare Other | Admitting: Ophthalmology

## 2017-04-11 DIAGNOSIS — H43813 Vitreous degeneration, bilateral: Secondary | ICD-10-CM

## 2017-04-11 DIAGNOSIS — I10 Essential (primary) hypertension: Secondary | ICD-10-CM

## 2017-04-11 DIAGNOSIS — H35033 Hypertensive retinopathy, bilateral: Secondary | ICD-10-CM | POA: Diagnosis not present

## 2017-04-11 DIAGNOSIS — H34812 Central retinal vein occlusion, left eye, with macular edema: Secondary | ICD-10-CM

## 2017-04-11 DIAGNOSIS — H3411 Central retinal artery occlusion, right eye: Secondary | ICD-10-CM | POA: Diagnosis not present

## 2017-05-06 DIAGNOSIS — Z7901 Long term (current) use of anticoagulants: Secondary | ICD-10-CM | POA: Diagnosis not present

## 2017-05-06 DIAGNOSIS — I4891 Unspecified atrial fibrillation: Secondary | ICD-10-CM | POA: Diagnosis not present

## 2017-05-07 DIAGNOSIS — Z7901 Long term (current) use of anticoagulants: Secondary | ICD-10-CM | POA: Diagnosis not present

## 2017-05-13 DIAGNOSIS — I4891 Unspecified atrial fibrillation: Secondary | ICD-10-CM | POA: Diagnosis not present

## 2017-05-15 DIAGNOSIS — Z7901 Long term (current) use of anticoagulants: Secondary | ICD-10-CM | POA: Diagnosis not present

## 2017-05-16 ENCOUNTER — Encounter (INDEPENDENT_AMBULATORY_CARE_PROVIDER_SITE_OTHER): Payer: Medicare Other | Admitting: Ophthalmology

## 2017-05-16 DIAGNOSIS — H43813 Vitreous degeneration, bilateral: Secondary | ICD-10-CM

## 2017-05-16 DIAGNOSIS — H3411 Central retinal artery occlusion, right eye: Secondary | ICD-10-CM

## 2017-05-16 DIAGNOSIS — H35033 Hypertensive retinopathy, bilateral: Secondary | ICD-10-CM | POA: Diagnosis not present

## 2017-05-16 DIAGNOSIS — H34812 Central retinal vein occlusion, left eye, with macular edema: Secondary | ICD-10-CM

## 2017-05-16 DIAGNOSIS — I10 Essential (primary) hypertension: Secondary | ICD-10-CM | POA: Diagnosis not present

## 2017-05-22 DIAGNOSIS — Z7901 Long term (current) use of anticoagulants: Secondary | ICD-10-CM | POA: Diagnosis not present

## 2017-05-30 DIAGNOSIS — Z7901 Long term (current) use of anticoagulants: Secondary | ICD-10-CM | POA: Diagnosis not present

## 2017-06-10 ENCOUNTER — Encounter (INDEPENDENT_AMBULATORY_CARE_PROVIDER_SITE_OTHER): Payer: Medicare Other | Admitting: Ophthalmology

## 2017-06-10 DIAGNOSIS — H43813 Vitreous degeneration, bilateral: Secondary | ICD-10-CM

## 2017-06-10 DIAGNOSIS — I10 Essential (primary) hypertension: Secondary | ICD-10-CM

## 2017-06-10 DIAGNOSIS — H35033 Hypertensive retinopathy, bilateral: Secondary | ICD-10-CM | POA: Diagnosis not present

## 2017-06-10 DIAGNOSIS — H3411 Central retinal artery occlusion, right eye: Secondary | ICD-10-CM

## 2017-06-10 DIAGNOSIS — H34812 Central retinal vein occlusion, left eye, with macular edema: Secondary | ICD-10-CM

## 2017-06-13 DIAGNOSIS — Z7901 Long term (current) use of anticoagulants: Secondary | ICD-10-CM | POA: Diagnosis not present

## 2017-06-13 DIAGNOSIS — E039 Hypothyroidism, unspecified: Secondary | ICD-10-CM | POA: Diagnosis not present

## 2017-06-13 DIAGNOSIS — R413 Other amnesia: Secondary | ICD-10-CM | POA: Diagnosis not present

## 2017-06-13 DIAGNOSIS — I10 Essential (primary) hypertension: Secondary | ICD-10-CM | POA: Diagnosis not present

## 2017-06-13 DIAGNOSIS — M8580 Other specified disorders of bone density and structure, unspecified site: Secondary | ICD-10-CM | POA: Diagnosis not present

## 2017-06-13 DIAGNOSIS — I480 Paroxysmal atrial fibrillation: Secondary | ICD-10-CM | POA: Diagnosis not present

## 2017-06-13 DIAGNOSIS — Z0000 Encounter for general adult medical examination without abnormal findings: Secondary | ICD-10-CM | POA: Diagnosis not present

## 2017-06-13 DIAGNOSIS — E782 Mixed hyperlipidemia: Secondary | ICD-10-CM | POA: Diagnosis not present

## 2017-06-13 DIAGNOSIS — I2510 Atherosclerotic heart disease of native coronary artery without angina pectoris: Secondary | ICD-10-CM | POA: Diagnosis not present

## 2017-06-17 DIAGNOSIS — E871 Hypo-osmolality and hyponatremia: Secondary | ICD-10-CM | POA: Diagnosis not present

## 2017-06-17 DIAGNOSIS — Z7901 Long term (current) use of anticoagulants: Secondary | ICD-10-CM | POA: Diagnosis not present

## 2017-06-20 DIAGNOSIS — Z7901 Long term (current) use of anticoagulants: Secondary | ICD-10-CM | POA: Diagnosis not present

## 2017-06-20 DIAGNOSIS — E871 Hypo-osmolality and hyponatremia: Secondary | ICD-10-CM | POA: Diagnosis not present

## 2017-06-24 ENCOUNTER — Ambulatory Visit: Payer: Medicare Other | Admitting: Neurology

## 2017-06-24 DIAGNOSIS — Z951 Presence of aortocoronary bypass graft: Secondary | ICD-10-CM | POA: Diagnosis not present

## 2017-06-24 DIAGNOSIS — I2510 Atherosclerotic heart disease of native coronary artery without angina pectoris: Secondary | ICD-10-CM | POA: Diagnosis not present

## 2017-06-24 DIAGNOSIS — I350 Nonrheumatic aortic (valve) stenosis: Secondary | ICD-10-CM | POA: Diagnosis not present

## 2017-06-24 DIAGNOSIS — I10 Essential (primary) hypertension: Secondary | ICD-10-CM | POA: Diagnosis not present

## 2017-06-24 DIAGNOSIS — E039 Hypothyroidism, unspecified: Secondary | ICD-10-CM | POA: Diagnosis not present

## 2017-06-24 DIAGNOSIS — I480 Paroxysmal atrial fibrillation: Secondary | ICD-10-CM | POA: Diagnosis not present

## 2017-06-24 DIAGNOSIS — Z7901 Long term (current) use of anticoagulants: Secondary | ICD-10-CM | POA: Diagnosis not present

## 2017-06-24 DIAGNOSIS — I119 Hypertensive heart disease without heart failure: Secondary | ICD-10-CM | POA: Diagnosis not present

## 2017-06-24 DIAGNOSIS — Z952 Presence of prosthetic heart valve: Secondary | ICD-10-CM | POA: Diagnosis not present

## 2017-06-24 DIAGNOSIS — H34231 Retinal artery branch occlusion, right eye: Secondary | ICD-10-CM | POA: Diagnosis not present

## 2017-06-24 DIAGNOSIS — E785 Hyperlipidemia, unspecified: Secondary | ICD-10-CM | POA: Diagnosis not present

## 2017-06-27 DIAGNOSIS — E871 Hypo-osmolality and hyponatremia: Secondary | ICD-10-CM | POA: Diagnosis not present

## 2017-06-28 ENCOUNTER — Encounter (INDEPENDENT_AMBULATORY_CARE_PROVIDER_SITE_OTHER): Payer: Medicare Other | Admitting: Ophthalmology

## 2017-07-01 DIAGNOSIS — Z7901 Long term (current) use of anticoagulants: Secondary | ICD-10-CM | POA: Diagnosis not present

## 2017-07-01 DIAGNOSIS — E871 Hypo-osmolality and hyponatremia: Secondary | ICD-10-CM | POA: Diagnosis not present

## 2017-07-08 ENCOUNTER — Encounter (INDEPENDENT_AMBULATORY_CARE_PROVIDER_SITE_OTHER): Payer: Medicare Other | Admitting: Ophthalmology

## 2017-07-08 DIAGNOSIS — M150 Primary generalized (osteo)arthritis: Secondary | ICD-10-CM | POA: Diagnosis not present

## 2017-07-08 DIAGNOSIS — H5410 Blindness, one eye, low vision other eye, unspecified eyes: Secondary | ICD-10-CM | POA: Diagnosis not present

## 2017-07-08 DIAGNOSIS — R29898 Other symptoms and signs involving the musculoskeletal system: Secondary | ICD-10-CM | POA: Diagnosis not present

## 2017-07-08 DIAGNOSIS — R2681 Unsteadiness on feet: Secondary | ICD-10-CM | POA: Diagnosis not present

## 2017-07-10 ENCOUNTER — Encounter (INDEPENDENT_AMBULATORY_CARE_PROVIDER_SITE_OTHER): Payer: Medicare Other | Admitting: Ophthalmology

## 2017-07-10 DIAGNOSIS — H35033 Hypertensive retinopathy, bilateral: Secondary | ICD-10-CM | POA: Diagnosis not present

## 2017-07-10 DIAGNOSIS — H34812 Central retinal vein occlusion, left eye, with macular edema: Secondary | ICD-10-CM | POA: Diagnosis not present

## 2017-07-10 DIAGNOSIS — H43813 Vitreous degeneration, bilateral: Secondary | ICD-10-CM | POA: Diagnosis not present

## 2017-07-10 DIAGNOSIS — I10 Essential (primary) hypertension: Secondary | ICD-10-CM | POA: Diagnosis not present

## 2017-07-10 DIAGNOSIS — H3411 Central retinal artery occlusion, right eye: Secondary | ICD-10-CM

## 2017-07-17 DIAGNOSIS — M150 Primary generalized (osteo)arthritis: Secondary | ICD-10-CM | POA: Diagnosis not present

## 2017-07-17 DIAGNOSIS — R29898 Other symptoms and signs involving the musculoskeletal system: Secondary | ICD-10-CM | POA: Diagnosis not present

## 2017-07-17 DIAGNOSIS — H5410 Blindness, one eye, low vision other eye, unspecified eyes: Secondary | ICD-10-CM | POA: Diagnosis not present

## 2017-07-17 DIAGNOSIS — R2681 Unsteadiness on feet: Secondary | ICD-10-CM | POA: Diagnosis not present

## 2017-07-23 DIAGNOSIS — Z7901 Long term (current) use of anticoagulants: Secondary | ICD-10-CM | POA: Diagnosis not present

## 2017-07-23 DIAGNOSIS — E871 Hypo-osmolality and hyponatremia: Secondary | ICD-10-CM | POA: Diagnosis not present

## 2017-07-24 DIAGNOSIS — R2681 Unsteadiness on feet: Secondary | ICD-10-CM | POA: Diagnosis not present

## 2017-07-24 DIAGNOSIS — M6281 Muscle weakness (generalized): Secondary | ICD-10-CM | POA: Diagnosis not present

## 2017-07-24 DIAGNOSIS — R262 Difficulty in walking, not elsewhere classified: Secondary | ICD-10-CM | POA: Diagnosis not present

## 2017-07-25 DIAGNOSIS — M810 Age-related osteoporosis without current pathological fracture: Secondary | ICD-10-CM | POA: Diagnosis not present

## 2017-07-25 DIAGNOSIS — M8588 Other specified disorders of bone density and structure, other site: Secondary | ICD-10-CM | POA: Diagnosis not present

## 2017-07-31 DIAGNOSIS — Z7901 Long term (current) use of anticoagulants: Secondary | ICD-10-CM | POA: Diagnosis not present

## 2017-08-07 ENCOUNTER — Encounter (INDEPENDENT_AMBULATORY_CARE_PROVIDER_SITE_OTHER): Payer: Medicare Other | Admitting: Ophthalmology

## 2017-08-07 DIAGNOSIS — H34812 Central retinal vein occlusion, left eye, with macular edema: Secondary | ICD-10-CM | POA: Diagnosis not present

## 2017-08-07 DIAGNOSIS — H3411 Central retinal artery occlusion, right eye: Secondary | ICD-10-CM | POA: Diagnosis not present

## 2017-08-07 DIAGNOSIS — H35033 Hypertensive retinopathy, bilateral: Secondary | ICD-10-CM | POA: Diagnosis not present

## 2017-08-07 DIAGNOSIS — I10 Essential (primary) hypertension: Secondary | ICD-10-CM | POA: Diagnosis not present

## 2017-08-07 DIAGNOSIS — H43813 Vitreous degeneration, bilateral: Secondary | ICD-10-CM | POA: Diagnosis not present

## 2017-08-08 DIAGNOSIS — R262 Difficulty in walking, not elsewhere classified: Secondary | ICD-10-CM | POA: Diagnosis not present

## 2017-08-08 DIAGNOSIS — M6281 Muscle weakness (generalized): Secondary | ICD-10-CM | POA: Diagnosis not present

## 2017-08-08 DIAGNOSIS — R2681 Unsteadiness on feet: Secondary | ICD-10-CM | POA: Diagnosis not present

## 2017-08-13 DIAGNOSIS — Z85828 Personal history of other malignant neoplasm of skin: Secondary | ICD-10-CM | POA: Diagnosis not present

## 2017-08-13 DIAGNOSIS — L570 Actinic keratosis: Secondary | ICD-10-CM | POA: Diagnosis not present

## 2017-08-13 DIAGNOSIS — L821 Other seborrheic keratosis: Secondary | ICD-10-CM | POA: Diagnosis not present

## 2017-08-13 DIAGNOSIS — D485 Neoplasm of uncertain behavior of skin: Secondary | ICD-10-CM | POA: Diagnosis not present

## 2017-08-13 DIAGNOSIS — M6281 Muscle weakness (generalized): Secondary | ICD-10-CM | POA: Diagnosis not present

## 2017-08-13 DIAGNOSIS — C44622 Squamous cell carcinoma of skin of right upper limb, including shoulder: Secondary | ICD-10-CM | POA: Diagnosis not present

## 2017-08-13 DIAGNOSIS — R262 Difficulty in walking, not elsewhere classified: Secondary | ICD-10-CM | POA: Diagnosis not present

## 2017-08-13 DIAGNOSIS — L858 Other specified epidermal thickening: Secondary | ICD-10-CM | POA: Diagnosis not present

## 2017-08-13 DIAGNOSIS — R2681 Unsteadiness on feet: Secondary | ICD-10-CM | POA: Diagnosis not present

## 2017-08-14 DIAGNOSIS — Z7901 Long term (current) use of anticoagulants: Secondary | ICD-10-CM | POA: Diagnosis not present

## 2017-08-19 DIAGNOSIS — Z7901 Long term (current) use of anticoagulants: Secondary | ICD-10-CM | POA: Diagnosis not present

## 2017-09-02 DIAGNOSIS — Z7901 Long term (current) use of anticoagulants: Secondary | ICD-10-CM | POA: Diagnosis not present

## 2017-09-04 ENCOUNTER — Encounter (INDEPENDENT_AMBULATORY_CARE_PROVIDER_SITE_OTHER): Payer: Medicare Other | Admitting: Ophthalmology

## 2017-09-13 ENCOUNTER — Encounter (INDEPENDENT_AMBULATORY_CARE_PROVIDER_SITE_OTHER): Payer: Medicare Other | Admitting: Ophthalmology

## 2017-09-13 DIAGNOSIS — H3411 Central retinal artery occlusion, right eye: Secondary | ICD-10-CM

## 2017-09-13 DIAGNOSIS — H43813 Vitreous degeneration, bilateral: Secondary | ICD-10-CM | POA: Diagnosis not present

## 2017-09-13 DIAGNOSIS — H34812 Central retinal vein occlusion, left eye, with macular edema: Secondary | ICD-10-CM | POA: Diagnosis not present

## 2017-09-13 DIAGNOSIS — H35033 Hypertensive retinopathy, bilateral: Secondary | ICD-10-CM

## 2017-09-13 DIAGNOSIS — I10 Essential (primary) hypertension: Secondary | ICD-10-CM | POA: Diagnosis not present

## 2017-09-18 DIAGNOSIS — Z23 Encounter for immunization: Secondary | ICD-10-CM | POA: Diagnosis not present

## 2017-09-18 DIAGNOSIS — E039 Hypothyroidism, unspecified: Secondary | ICD-10-CM | POA: Diagnosis not present

## 2017-09-18 DIAGNOSIS — M810 Age-related osteoporosis without current pathological fracture: Secondary | ICD-10-CM | POA: Diagnosis not present

## 2017-09-18 DIAGNOSIS — Z7901 Long term (current) use of anticoagulants: Secondary | ICD-10-CM | POA: Diagnosis not present

## 2017-09-18 DIAGNOSIS — I10 Essential (primary) hypertension: Secondary | ICD-10-CM | POA: Diagnosis not present

## 2017-10-03 DIAGNOSIS — I4891 Unspecified atrial fibrillation: Secondary | ICD-10-CM | POA: Diagnosis not present

## 2017-10-03 DIAGNOSIS — Z7901 Long term (current) use of anticoagulants: Secondary | ICD-10-CM | POA: Diagnosis not present

## 2017-10-11 ENCOUNTER — Encounter (INDEPENDENT_AMBULATORY_CARE_PROVIDER_SITE_OTHER): Payer: Medicare Other | Admitting: Ophthalmology

## 2017-10-11 DIAGNOSIS — H43813 Vitreous degeneration, bilateral: Secondary | ICD-10-CM

## 2017-10-11 DIAGNOSIS — H3411 Central retinal artery occlusion, right eye: Secondary | ICD-10-CM | POA: Diagnosis not present

## 2017-10-11 DIAGNOSIS — H34812 Central retinal vein occlusion, left eye, with macular edema: Secondary | ICD-10-CM

## 2017-10-11 DIAGNOSIS — I10 Essential (primary) hypertension: Secondary | ICD-10-CM | POA: Diagnosis not present

## 2017-10-11 DIAGNOSIS — H35033 Hypertensive retinopathy, bilateral: Secondary | ICD-10-CM

## 2017-10-31 DIAGNOSIS — I4891 Unspecified atrial fibrillation: Secondary | ICD-10-CM | POA: Diagnosis not present

## 2017-10-31 DIAGNOSIS — Z7901 Long term (current) use of anticoagulants: Secondary | ICD-10-CM | POA: Diagnosis not present

## 2017-10-31 DIAGNOSIS — I5023 Acute on chronic systolic (congestive) heart failure: Secondary | ICD-10-CM | POA: Diagnosis not present

## 2017-11-08 ENCOUNTER — Encounter (INDEPENDENT_AMBULATORY_CARE_PROVIDER_SITE_OTHER): Payer: Medicare Other | Admitting: Ophthalmology

## 2017-11-08 DIAGNOSIS — H34812 Central retinal vein occlusion, left eye, with macular edema: Secondary | ICD-10-CM | POA: Diagnosis not present

## 2017-11-08 DIAGNOSIS — H35033 Hypertensive retinopathy, bilateral: Secondary | ICD-10-CM | POA: Diagnosis not present

## 2017-11-08 DIAGNOSIS — H3411 Central retinal artery occlusion, right eye: Secondary | ICD-10-CM | POA: Diagnosis not present

## 2017-11-08 DIAGNOSIS — I10 Essential (primary) hypertension: Secondary | ICD-10-CM | POA: Diagnosis not present

## 2017-11-08 DIAGNOSIS — H43813 Vitreous degeneration, bilateral: Secondary | ICD-10-CM | POA: Diagnosis not present

## 2017-11-21 DIAGNOSIS — I4891 Unspecified atrial fibrillation: Secondary | ICD-10-CM | POA: Diagnosis not present

## 2017-11-21 DIAGNOSIS — Z7901 Long term (current) use of anticoagulants: Secondary | ICD-10-CM | POA: Diagnosis not present

## 2017-12-06 ENCOUNTER — Encounter (INDEPENDENT_AMBULATORY_CARE_PROVIDER_SITE_OTHER): Payer: Medicare Other | Admitting: Ophthalmology

## 2017-12-09 ENCOUNTER — Encounter (INDEPENDENT_AMBULATORY_CARE_PROVIDER_SITE_OTHER): Payer: Medicare Other | Admitting: Ophthalmology

## 2017-12-09 DIAGNOSIS — I10 Essential (primary) hypertension: Secondary | ICD-10-CM

## 2017-12-09 DIAGNOSIS — H3411 Central retinal artery occlusion, right eye: Secondary | ICD-10-CM

## 2017-12-09 DIAGNOSIS — H34812 Central retinal vein occlusion, left eye, with macular edema: Secondary | ICD-10-CM | POA: Diagnosis not present

## 2017-12-09 DIAGNOSIS — H43813 Vitreous degeneration, bilateral: Secondary | ICD-10-CM

## 2017-12-09 DIAGNOSIS — H35033 Hypertensive retinopathy, bilateral: Secondary | ICD-10-CM | POA: Diagnosis not present

## 2017-12-19 DIAGNOSIS — Z7901 Long term (current) use of anticoagulants: Secondary | ICD-10-CM | POA: Diagnosis not present

## 2017-12-19 DIAGNOSIS — I4891 Unspecified atrial fibrillation: Secondary | ICD-10-CM | POA: Diagnosis not present

## 2017-12-20 DIAGNOSIS — Z7901 Long term (current) use of anticoagulants: Secondary | ICD-10-CM | POA: Diagnosis not present

## 2017-12-23 DIAGNOSIS — Z7901 Long term (current) use of anticoagulants: Secondary | ICD-10-CM | POA: Diagnosis not present

## 2017-12-26 DIAGNOSIS — I4891 Unspecified atrial fibrillation: Secondary | ICD-10-CM | POA: Diagnosis not present

## 2017-12-26 DIAGNOSIS — Z7901 Long term (current) use of anticoagulants: Secondary | ICD-10-CM | POA: Diagnosis not present

## 2017-12-27 DIAGNOSIS — Z7901 Long term (current) use of anticoagulants: Secondary | ICD-10-CM | POA: Diagnosis not present

## 2017-12-30 DIAGNOSIS — Z7901 Long term (current) use of anticoagulants: Secondary | ICD-10-CM | POA: Diagnosis not present

## 2017-12-30 DIAGNOSIS — D485 Neoplasm of uncertain behavior of skin: Secondary | ICD-10-CM | POA: Diagnosis not present

## 2017-12-30 DIAGNOSIS — Z85828 Personal history of other malignant neoplasm of skin: Secondary | ICD-10-CM | POA: Diagnosis not present

## 2017-12-30 DIAGNOSIS — C44729 Squamous cell carcinoma of skin of left lower limb, including hip: Secondary | ICD-10-CM | POA: Diagnosis not present

## 2018-01-02 DIAGNOSIS — Z7901 Long term (current) use of anticoagulants: Secondary | ICD-10-CM | POA: Diagnosis not present

## 2018-01-02 DIAGNOSIS — I4891 Unspecified atrial fibrillation: Secondary | ICD-10-CM | POA: Diagnosis not present

## 2018-01-13 ENCOUNTER — Encounter (INDEPENDENT_AMBULATORY_CARE_PROVIDER_SITE_OTHER): Payer: Medicare Other | Admitting: Ophthalmology

## 2018-01-13 DIAGNOSIS — H43813 Vitreous degeneration, bilateral: Secondary | ICD-10-CM | POA: Diagnosis not present

## 2018-01-13 DIAGNOSIS — I10 Essential (primary) hypertension: Secondary | ICD-10-CM

## 2018-01-13 DIAGNOSIS — H34812 Central retinal vein occlusion, left eye, with macular edema: Secondary | ICD-10-CM | POA: Diagnosis not present

## 2018-01-13 DIAGNOSIS — H35033 Hypertensive retinopathy, bilateral: Secondary | ICD-10-CM

## 2018-01-13 DIAGNOSIS — H3411 Central retinal artery occlusion, right eye: Secondary | ICD-10-CM

## 2018-01-15 ENCOUNTER — Ambulatory Visit (INDEPENDENT_AMBULATORY_CARE_PROVIDER_SITE_OTHER): Payer: Medicare Other | Admitting: Podiatry

## 2018-01-15 ENCOUNTER — Encounter: Payer: Self-pay | Admitting: Podiatry

## 2018-01-15 DIAGNOSIS — M79674 Pain in right toe(s): Secondary | ICD-10-CM

## 2018-01-15 DIAGNOSIS — B351 Tinea unguium: Secondary | ICD-10-CM | POA: Diagnosis not present

## 2018-01-15 DIAGNOSIS — M79675 Pain in left toe(s): Secondary | ICD-10-CM | POA: Diagnosis not present

## 2018-01-15 DIAGNOSIS — D689 Coagulation defect, unspecified: Secondary | ICD-10-CM | POA: Diagnosis not present

## 2018-01-15 NOTE — Progress Notes (Signed)
Patient ID: Carol [name], female   DOB: [birth year], 84 y.o.   MRN: [id number] Complaint:  Visit Type: Patient returns to my office for continued preventative foot care services. Complaint: Patient states" my nails have grown long and thick and become painful to walk and wear shoes" . She presents for preventative foot care services. No changes to ROS.    Podiatric Exam: Vascular: dorsalis pedis and posterior tibial pulses are palpable bilateral. Capillary return is immediate. Temperature gradient is WNL. Skin turgor WNL  Sensorium: Normal Semmes Weinstein monofilament test. Normal tactile sensation bilaterally. Nail Exam: Pt has thick disfigured discolored nails with subungual debris noted bilateral entire nail hallux through fifth toenails Ulcer Exam: There is no evidence of ulcer or pre-ulcerative changes or infection. Orthopedic Exam: Muscle tone and strength are WNL. No limitations in general ROM. No crepitus or effusions noted. Foot type and digits show no abnormalities. Bony prominences are unremarkable. Pain and redness posterior calcaneus left foot secondary to pressure  .Skin: . No infection or ulcers   Diagnosis:  Tinea unguium, Pain in right toe, pain in left toes  Treatment & Plan Procedures and Treatment: Consent by patient was obtained for treatment procedures. The patient understood the discussion of treatment and procedures well. All questions were answered thoroughly reviewed. Debridement of mycotic and hypertrophic toenails, 1 through 5 bilateral and clearing of subungual debris. No ulceration, no infection noted. Return Visit-Office Procedure: Patient instructed to return to the office for a follow up visit 3 months for continued evaluation and treatment.  Gardiner Barefoot DPM

## 2018-01-16 DIAGNOSIS — Z7901 Long term (current) use of anticoagulants: Secondary | ICD-10-CM | POA: Diagnosis not present

## 2018-01-16 DIAGNOSIS — I4891 Unspecified atrial fibrillation: Secondary | ICD-10-CM | POA: Diagnosis not present

## 2018-01-20 DIAGNOSIS — Z952 Presence of prosthetic heart valve: Secondary | ICD-10-CM | POA: Diagnosis not present

## 2018-01-20 DIAGNOSIS — I4891 Unspecified atrial fibrillation: Secondary | ICD-10-CM | POA: Diagnosis not present

## 2018-01-20 DIAGNOSIS — I119 Hypertensive heart disease without heart failure: Secondary | ICD-10-CM | POA: Diagnosis not present

## 2018-01-20 DIAGNOSIS — I480 Paroxysmal atrial fibrillation: Secondary | ICD-10-CM | POA: Diagnosis not present

## 2018-01-20 DIAGNOSIS — E039 Hypothyroidism, unspecified: Secondary | ICD-10-CM | POA: Diagnosis not present

## 2018-01-20 DIAGNOSIS — E7849 Other hyperlipidemia: Secondary | ICD-10-CM | POA: Diagnosis not present

## 2018-01-20 DIAGNOSIS — I350 Nonrheumatic aortic (valve) stenosis: Secondary | ICD-10-CM | POA: Diagnosis not present

## 2018-01-20 DIAGNOSIS — Z951 Presence of aortocoronary bypass graft: Secondary | ICD-10-CM | POA: Diagnosis not present

## 2018-01-20 DIAGNOSIS — H34231 Retinal artery branch occlusion, right eye: Secondary | ICD-10-CM | POA: Diagnosis not present

## 2018-01-20 DIAGNOSIS — Z7901 Long term (current) use of anticoagulants: Secondary | ICD-10-CM | POA: Diagnosis not present

## 2018-01-20 DIAGNOSIS — I10 Essential (primary) hypertension: Secondary | ICD-10-CM | POA: Diagnosis not present

## 2018-01-20 DIAGNOSIS — I2510 Atherosclerotic heart disease of native coronary artery without angina pectoris: Secondary | ICD-10-CM | POA: Diagnosis not present

## 2018-01-27 DIAGNOSIS — Z7901 Long term (current) use of anticoagulants: Secondary | ICD-10-CM | POA: Diagnosis not present

## 2018-01-27 DIAGNOSIS — I4891 Unspecified atrial fibrillation: Secondary | ICD-10-CM | POA: Diagnosis not present

## 2018-02-10 ENCOUNTER — Encounter (INDEPENDENT_AMBULATORY_CARE_PROVIDER_SITE_OTHER): Payer: Medicare Other | Admitting: Ophthalmology

## 2018-02-10 DIAGNOSIS — H3411 Central retinal artery occlusion, right eye: Secondary | ICD-10-CM

## 2018-02-10 DIAGNOSIS — I10 Essential (primary) hypertension: Secondary | ICD-10-CM

## 2018-02-10 DIAGNOSIS — H43813 Vitreous degeneration, bilateral: Secondary | ICD-10-CM | POA: Diagnosis not present

## 2018-02-10 DIAGNOSIS — H35033 Hypertensive retinopathy, bilateral: Secondary | ICD-10-CM

## 2018-02-10 DIAGNOSIS — I4891 Unspecified atrial fibrillation: Secondary | ICD-10-CM | POA: Diagnosis not present

## 2018-02-10 DIAGNOSIS — Z7901 Long term (current) use of anticoagulants: Secondary | ICD-10-CM | POA: Diagnosis not present

## 2018-02-10 DIAGNOSIS — H34812 Central retinal vein occlusion, left eye, with macular edema: Secondary | ICD-10-CM

## 2018-02-24 DIAGNOSIS — I4891 Unspecified atrial fibrillation: Secondary | ICD-10-CM | POA: Diagnosis not present

## 2018-02-24 DIAGNOSIS — Z7901 Long term (current) use of anticoagulants: Secondary | ICD-10-CM | POA: Diagnosis not present

## 2018-03-10 DIAGNOSIS — Z7901 Long term (current) use of anticoagulants: Secondary | ICD-10-CM | POA: Diagnosis not present

## 2018-03-10 DIAGNOSIS — I4891 Unspecified atrial fibrillation: Secondary | ICD-10-CM | POA: Diagnosis not present

## 2018-03-17 ENCOUNTER — Encounter (INDEPENDENT_AMBULATORY_CARE_PROVIDER_SITE_OTHER): Payer: Medicare Other | Admitting: Ophthalmology

## 2018-03-17 DIAGNOSIS — I10 Essential (primary) hypertension: Secondary | ICD-10-CM

## 2018-03-17 DIAGNOSIS — H35033 Hypertensive retinopathy, bilateral: Secondary | ICD-10-CM | POA: Diagnosis not present

## 2018-03-17 DIAGNOSIS — H43813 Vitreous degeneration, bilateral: Secondary | ICD-10-CM | POA: Diagnosis not present

## 2018-03-17 DIAGNOSIS — H34812 Central retinal vein occlusion, left eye, with macular edema: Secondary | ICD-10-CM | POA: Diagnosis not present

## 2018-03-17 DIAGNOSIS — H3411 Central retinal artery occlusion, right eye: Secondary | ICD-10-CM | POA: Diagnosis not present

## 2018-03-24 DIAGNOSIS — I4891 Unspecified atrial fibrillation: Secondary | ICD-10-CM | POA: Diagnosis not present

## 2018-03-24 DIAGNOSIS — Z7901 Long term (current) use of anticoagulants: Secondary | ICD-10-CM | POA: Diagnosis not present

## 2018-03-31 DIAGNOSIS — Z7901 Long term (current) use of anticoagulants: Secondary | ICD-10-CM | POA: Diagnosis not present

## 2018-03-31 DIAGNOSIS — E782 Mixed hyperlipidemia: Secondary | ICD-10-CM | POA: Diagnosis not present

## 2018-03-31 DIAGNOSIS — I2510 Atherosclerotic heart disease of native coronary artery without angina pectoris: Secondary | ICD-10-CM | POA: Diagnosis not present

## 2018-03-31 DIAGNOSIS — I480 Paroxysmal atrial fibrillation: Secondary | ICD-10-CM | POA: Diagnosis not present

## 2018-03-31 DIAGNOSIS — F419 Anxiety disorder, unspecified: Secondary | ICD-10-CM | POA: Diagnosis not present

## 2018-03-31 DIAGNOSIS — I10 Essential (primary) hypertension: Secondary | ICD-10-CM | POA: Diagnosis not present

## 2018-03-31 DIAGNOSIS — E039 Hypothyroidism, unspecified: Secondary | ICD-10-CM | POA: Diagnosis not present

## 2018-04-07 DIAGNOSIS — Z7901 Long term (current) use of anticoagulants: Secondary | ICD-10-CM | POA: Diagnosis not present

## 2018-04-07 DIAGNOSIS — I4891 Unspecified atrial fibrillation: Secondary | ICD-10-CM | POA: Diagnosis not present

## 2018-04-14 DIAGNOSIS — Z7901 Long term (current) use of anticoagulants: Secondary | ICD-10-CM | POA: Diagnosis not present

## 2018-04-14 DIAGNOSIS — I4891 Unspecified atrial fibrillation: Secondary | ICD-10-CM | POA: Diagnosis not present

## 2018-04-16 ENCOUNTER — Ambulatory Visit: Payer: Medicare Other | Admitting: Podiatry

## 2018-04-23 ENCOUNTER — Encounter (INDEPENDENT_AMBULATORY_CARE_PROVIDER_SITE_OTHER): Payer: Medicare Other | Admitting: Ophthalmology

## 2018-04-23 DIAGNOSIS — I10 Essential (primary) hypertension: Secondary | ICD-10-CM

## 2018-04-23 DIAGNOSIS — H43813 Vitreous degeneration, bilateral: Secondary | ICD-10-CM

## 2018-04-23 DIAGNOSIS — H35033 Hypertensive retinopathy, bilateral: Secondary | ICD-10-CM

## 2018-04-23 DIAGNOSIS — H34812 Central retinal vein occlusion, left eye, with macular edema: Secondary | ICD-10-CM | POA: Diagnosis not present

## 2018-04-23 DIAGNOSIS — H3411 Central retinal artery occlusion, right eye: Secondary | ICD-10-CM

## 2018-05-05 DIAGNOSIS — Z79899 Other long term (current) drug therapy: Secondary | ICD-10-CM | POA: Diagnosis not present

## 2018-05-05 DIAGNOSIS — R0602 Shortness of breath: Secondary | ICD-10-CM | POA: Diagnosis not present

## 2018-05-05 DIAGNOSIS — I5022 Chronic systolic (congestive) heart failure: Secondary | ICD-10-CM | POA: Diagnosis not present

## 2018-05-05 DIAGNOSIS — Z7901 Long term (current) use of anticoagulants: Secondary | ICD-10-CM | POA: Diagnosis not present

## 2018-05-13 ENCOUNTER — Inpatient Hospital Stay (HOSPITAL_COMMUNITY)
Admission: EM | Admit: 2018-05-13 | Discharge: 2018-05-18 | DRG: 811 | Disposition: A | Discharge status: Home Health | Payer: Medicare Other | Attending: Internal Medicine | Admitting: Internal Medicine

## 2018-05-13 ENCOUNTER — Encounter: Payer: Medicare Other | Admitting: Family

## 2018-05-13 ENCOUNTER — Encounter (HOSPITAL_COMMUNITY): Payer: Self-pay | Admitting: Emergency Medicine

## 2018-05-13 DIAGNOSIS — D649 Anemia, unspecified: Secondary | ICD-10-CM | POA: Diagnosis not present

## 2018-05-13 DIAGNOSIS — Z7901 Long term (current) use of anticoagulants: Secondary | ICD-10-CM

## 2018-05-13 DIAGNOSIS — Z79899 Other long term (current) drug therapy: Secondary | ICD-10-CM

## 2018-05-13 DIAGNOSIS — E785 Hyperlipidemia, unspecified: Secondary | ICD-10-CM | POA: Diagnosis present

## 2018-05-13 DIAGNOSIS — D591 Autoimmune hemolytic anemia, unspecified: Secondary | ICD-10-CM

## 2018-05-13 DIAGNOSIS — N390 Urinary tract infection, site not specified: Secondary | ICD-10-CM | POA: Diagnosis not present

## 2018-05-13 DIAGNOSIS — R17 Unspecified jaundice: Secondary | ICD-10-CM | POA: Diagnosis present

## 2018-05-13 DIAGNOSIS — D62 Acute posthemorrhagic anemia: Secondary | ICD-10-CM | POA: Diagnosis present

## 2018-05-13 DIAGNOSIS — Z886 Allergy status to analgesic agent status: Secondary | ICD-10-CM

## 2018-05-13 DIAGNOSIS — Z951 Presence of aortocoronary bypass graft: Secondary | ICD-10-CM

## 2018-05-13 DIAGNOSIS — Z9049 Acquired absence of other specified parts of digestive tract: Secondary | ICD-10-CM

## 2018-05-13 DIAGNOSIS — Z79890 Hormone replacement therapy (postmenopausal): Secondary | ICD-10-CM

## 2018-05-13 DIAGNOSIS — Z888 Allergy status to other drugs, medicaments and biological substances status: Secondary | ICD-10-CM

## 2018-05-13 DIAGNOSIS — Z8249 Family history of ischemic heart disease and other diseases of the circulatory system: Secondary | ICD-10-CM

## 2018-05-13 DIAGNOSIS — Z9109 Other allergy status, other than to drugs and biological substances: Secondary | ICD-10-CM

## 2018-05-13 DIAGNOSIS — K831 Obstruction of bile duct: Secondary | ICD-10-CM | POA: Diagnosis present

## 2018-05-13 DIAGNOSIS — K5900 Constipation, unspecified: Secondary | ICD-10-CM | POA: Diagnosis not present

## 2018-05-13 DIAGNOSIS — H543 Unqualified visual loss, both eyes: Secondary | ICD-10-CM | POA: Diagnosis present

## 2018-05-13 DIAGNOSIS — Z885 Allergy status to narcotic agent status: Secondary | ICD-10-CM

## 2018-05-13 DIAGNOSIS — E871 Hypo-osmolality and hyponatremia: Secondary | ICD-10-CM | POA: Diagnosis not present

## 2018-05-13 DIAGNOSIS — R399 Unspecified symptoms and signs involving the genitourinary system: Secondary | ICD-10-CM | POA: Diagnosis not present

## 2018-05-13 DIAGNOSIS — I119 Hypertensive heart disease without heart failure: Secondary | ICD-10-CM | POA: Diagnosis present

## 2018-05-13 DIAGNOSIS — Z952 Presence of prosthetic heart valve: Secondary | ICD-10-CM

## 2018-05-13 DIAGNOSIS — E039 Hypothyroidism, unspecified: Secondary | ICD-10-CM | POA: Diagnosis present

## 2018-05-13 DIAGNOSIS — R531 Weakness: Secondary | ICD-10-CM | POA: Diagnosis not present

## 2018-05-13 DIAGNOSIS — I2510 Atherosclerotic heart disease of native coronary artery without angina pectoris: Secondary | ICD-10-CM | POA: Diagnosis present

## 2018-05-13 DIAGNOSIS — Z91040 Latex allergy status: Secondary | ICD-10-CM

## 2018-05-13 DIAGNOSIS — H3411 Central retinal artery occlusion, right eye: Secondary | ICD-10-CM | POA: Diagnosis present

## 2018-05-13 DIAGNOSIS — D5911 Warm autoimmune hemolytic anemia: Secondary | ICD-10-CM

## 2018-05-13 DIAGNOSIS — Z87891 Personal history of nicotine dependence: Secondary | ICD-10-CM

## 2018-05-13 DIAGNOSIS — I69398 Other sequelae of cerebral infarction: Secondary | ICD-10-CM

## 2018-05-13 DIAGNOSIS — Z881 Allergy status to other antibiotic agents status: Secondary | ICD-10-CM

## 2018-05-13 DIAGNOSIS — I4891 Unspecified atrial fibrillation: Secondary | ICD-10-CM | POA: Diagnosis present

## 2018-05-13 DIAGNOSIS — I482 Chronic atrial fibrillation: Secondary | ICD-10-CM | POA: Diagnosis present

## 2018-05-13 HISTORY — DX: Acute posthemorrhagic anemia: D62

## 2018-05-13 LAB — CBC
HCT: 22.7 % — ABNORMAL LOW (ref 36.0–46.0)
Hemoglobin: 7.9 g/dL — ABNORMAL LOW (ref 12.0–15.0)
MCH: 35.1 pg — ABNORMAL HIGH (ref 26.0–34.0)
MCHC: 34.8 g/dL (ref 30.0–36.0)
MCV: 100.9 fL — ABNORMAL HIGH (ref 78.0–100.0)
Platelets: 264 10*3/uL (ref 150–400)
RBC: 2.25 MIL/uL — ABNORMAL LOW (ref 3.87–5.11)
RDW: 21.5 % — ABNORMAL HIGH (ref 11.5–15.5)
WBC: 8 10*3/uL (ref 4.0–10.5)

## 2018-05-13 LAB — COMPREHENSIVE METABOLIC PANEL
ALT: 15 U/L (ref 14–54)
AST: 33 U/L (ref 15–41)
Albumin: 4 g/dL (ref 3.5–5.0)
Alkaline Phosphatase: 47 U/L (ref 38–126)
Anion gap: 10 (ref 5–15)
BUN: 18 mg/dL (ref 6–20)
CO2: 23 mmol/L (ref 22–32)
Calcium: 9 mg/dL (ref 8.9–10.3)
Chloride: 92 mmol/L — ABNORMAL LOW (ref 101–111)
Creatinine, Ser: 0.92 mg/dL (ref 0.44–1.00)
GFR calc Af Amer: >60 mL/min (ref >60)
GFR calc non Af Amer: 54 mL/min — ABNORMAL LOW (ref >60)
Glucose, Bld: 104 mg/dL — ABNORMAL HIGH (ref 65–99)
Potassium: 3.9 mmol/L (ref 3.5–5.1)
Sodium: 125 mmol/L — ABNORMAL LOW (ref 135–145)
Total Bilirubin: 4.3 mg/dL — ABNORMAL HIGH (ref 0.3–1.2)
Total Protein: 6 g/dL — ABNORMAL LOW (ref 6.5–8.1)

## 2018-05-13 LAB — URINALYSIS, ROUTINE W REFLEX MICROSCOPIC
Bilirubin Urine: NEGATIVE
Glucose, UA: NEGATIVE mg/dL
Hgb urine dipstick: NEGATIVE
Ketones, ur: NEGATIVE mg/dL
Nitrite: NEGATIVE
Protein, ur: NEGATIVE mg/dL
Specific Gravity, Urine: 1.009 (ref 1.005–1.030)
WBC, UA: >50 WBC/hpf — ABNORMAL HIGH (ref 0–5)
pH: 7 (ref 5.0–8.0)

## 2018-05-13 LAB — POC OCCULT BLOOD, ED: Fecal Occult Bld: NEGATIVE

## 2018-05-13 LAB — PROTIME-INR
INR: 2.66
Prothrombin Time: 28.2 seconds — ABNORMAL HIGH (ref 11.4–15.2)

## 2018-05-13 LAB — PREPARE RBC (CROSSMATCH)

## 2018-05-13 MED ORDER — SODIUM CHLORIDE 0.9 % IV BOLUS
500.0000 mL | Freq: Once | INTRAVENOUS | Status: AC
  Administered 2018-05-13: 500 mL via INTRAVENOUS

## 2018-05-13 NOTE — ED Triage Notes (Signed)
Pt presents from PCP with family who reports patient had been c/o weakness for the last couple days; went to see Dr who did blood work and urine test which showed UTI, high bilirubin, low hemoglobin and low sodium; pt appears jaundice in triage; pt reports slight burning with urination, regular BMs, denies noticeable blood loss but is on warfarin

## 2018-05-13 NOTE — ED Provider Notes (Addendum)
[facility] EMERGENCY DEPARTMENT Provider Note   CSN: [id number] Arrival date & time: 05/13/18  2104     History   Chief Complaint Chief Complaint  Patient presents with  . Abnormal Lab    bilirubin, hgb, sodium  . Weakness    HPI Carol [name] is a 84 y.o. female.  Patient with a history of atrial fibrillation, HTN, CAD, HLD, thyroid disease, coagulopathy (Coumadin), presents with 3-4 days of progressive, generalized weakness and lightheadedness. No syncope. No SOB, pain of any kind, vomiting, diarrhea. She describes some burning with urination but no fever or flank pain. She has intermittent mild nausea. She went to see her doctor today and was called this evening at home with report of abnormal labs, specifically (per daughter) hemoglobin of 9.5, sodium of 125, elevated bilirubin of 4.0. She was advised to come to the hospital for further evaluation and management.   The history is provided by the patient and a relative. No language interpreter was used.    Past Medical History:  Diagnosis Date  . Aortic stenosis   . Coronary artery disease    Cath 1/13 left main  . 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 Dyspnea   . Hyperlipidemia   . Hypertensive heart disease without CHF   . Hypothyroidism   . Lumbar disc disease   . Lung nodules    Sees Wert, thought to be benign  . Obesity   . Spinal stenosis   . Villous adenoma of rectum     Patient Active Problem List   Diagnosis Date Noted  . 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 Long term (current) use of anticoagulants 07/14/2013  . Chronic diastolic heart failure ([place]) 01/17/2012  . Hyponatremia 01/17/2012  . Atrial fibrillation ([place])   . 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 CAD (coronary artery disease) 12/26/2011  . History of Villous adenoma of rectum   . Hypertensive heart disease without CHF   . Hyperlipidemia   . Hypothyroidism   . Lumbar  degenerative disc disease   . Aortic stenosis 11/08/2011  . Personal history of solitary pulmonary nodule 11/07/2011    Past Surgical History:  Procedure Laterality Date  . AORTIC VALVE REPLACEMENT  12/28/2011   Procedure: AORTIC VALVE REPLACEMENT (AVR);  Surgeon: Grace Isaac, MD;  Location: [place];  Service: Open Heart Surgery;  Laterality: N/A;  . CARDIAC CATHETERIZATION  2013  . CHOLECYSTECTOMY    . CORONARY ARTERY BYPASS GRAFT  12/28/2011   Procedure: CORONARY ARTERY BYPASS GRAFTING (CABG);  Surgeon: Grace Isaac, MD;  Location: [place];  Service: Open Heart Surgery;  Laterality: N/A;  . HERNIA REPAIR    . LEFT AND RIGHT HEART CATHETERIZATION WITH CORONARY ANGIOGRAM N/A 12/26/2011   Procedure: LEFT AND RIGHT HEART CATHETERIZATION WITH CORONARY ANGIOGRAM;  Surgeon: Jacolyn Reedy, MD;  Location: [facility] CATH LAB;  Service: Cardiovascular;  Laterality: N/A;  . LUMBAR LAMINECTOMY    . PARTIAL COLECTOMY  2008   Villous adenoma     OB History   None      Home Medications    Prior to Admission medications   Medication Sig Start Date End Date Taking? Authorizing Provider  acetaminophen (TYLENOL) 500 MG tablet Take 500 mg by mouth See admin instructions. Take 500 mg by mouth one to two times a day SCHEDULED   Yes [provider]  ALPRAZolam (XANAX) 0.25 MG tablet Take 0.25 mg by mouth at bedtime.  02/04/16  Yes [provider]  atorvastatin (LIPITOR) 20 MG tablet Take 20 mg by mouth at bedtime.     Yes [provider]  BESIVANCE 0.6 % SUSP Place 1 drop into the left eye See admin instructions. Place 1 drop left eye 4 times daily for 2 days after Avastin eye injections 11/23/16  Yes [provider]  Calcium Carbonate-Vitamin D (CALCIUM 600 + D PO) Take 1 tablet by mouth every morning.    Yes [provider]  docusate sodium (COLACE) 100 MG capsule Take 100 mg by mouth at bedtime.    Yes [provider]  gabapentin (NEURONTIN) 100 MG  capsule TAKE 2 CAPSULES BY MOUTH 3 TIMES A DAY 06/24/15  Yes [provider]  levothyroxine (SYNTHROID, LEVOTHROID) 88 MCG tablet TAKE 1 TABLET BY MOUTH EVERY MORNING ON AN EMPTY STOMACH 06/28/15  Yes [provider]  losartan (COZAAR) 100 MG tablet Take 100 mg by mouth daily. 06/19/15  Yes [provider]  metoprolol tartrate (LOPRESSOR) 25 MG tablet Take 25 mg by mouth 2 (two) times daily. 03/02/16  Yes [provider]  Multiple Vitamin (MULITIVITAMIN WITH MINERALS) TABS Take 1 tablet by mouth daily.     Yes [provider]  oxybutynin (DITROPAN) 5 MG tablet Take 5 mg by mouth every other day.   Yes [provider]  polyethylene glycol (MIRALAX / GLYCOLAX) packet Take 17 g by mouth daily as needed for mild constipation.    Yes [provider]  Probiotic Product (PROBIOTIC PO) Take 1 capsule by mouth daily.   Yes [provider]  warfarin (COUMADIN) 1 MG tablet Take 1-3 mg by mouth See admin instructions. Take 1 mg by mouth at bedtime on Sun/Mon/Wed/Fri/Sat and 2 mg on Tues and 3 mg on Thurs, in conjunction with one 5 mg tablet 03/14/16  Yes [provider]  warfarin (COUMADIN) 5 MG tablet Take 5 mg by mouth at bedtime.    Yes [provider]  predniSONE (DELTASONE) 5 MG tablet Take 3 tablets daily for 5 days, then take 2 tablets daily for 5 days, then one tablet daily for 5 days, then stop Patient not taking: Reported on 05/13/2018 02/05/17   Kathrynn Ducking, MD  amiodarone (PACERONE) 200 MG tablet Take 200 mg by mouth daily.  03/06/12  [provider]    Family History Family History  Problem Relation Age of Onset  . Lymphoma Sister   . Cancer Sister   . Breast cancer Sister   . Cancer Mother   . Heart attack Father   . Heart disease Father     Social History Social History   Tobacco Use  . Smoking status: Former Smoker    Packs/day: 0.50    Years: 10.00    Pack years: 5.00    Types:  Cigarettes    Last attempt to quit: 12/24/1978    Years since quitting: 39.4  . Smokeless tobacco: Never Used  Substance Use Topics  . Alcohol use: No  . Drug use: No     Allergies   Ibuprofen; Nucynta [tapentadol]; Ace inhibitors; Doxycycline; Hydrocodone; Nsaids; Adhesive [tape]; and Latex   Review of Systems Review of Systems  Constitutional: Positive for appetite change. Negative for chills and fever.  HENT: Negative.  Negative for congestion.   Respiratory: Negative.  Negative for cough and shortness of breath.   Cardiovascular: Negative.  Negative for chest pain.  Gastrointestinal: Positive for nausea. Negative for abdominal pain, diarrhea and  vomiting.  Genitourinary: Positive for dysuria. Negative for flank pain and frequency.  Musculoskeletal: Negative.  Negative for myalgias.  Skin: Positive for color change ("yellowish" today).  Neurological: Positive for weakness and light-headedness. Negative for syncope.     Physical Exam Updated Vital Signs BP (!) 99/45   Pulse 63   Temp (!) 97.5 F (36.4 C) (Oral)   Resp 18   SpO2 97%   Physical Exam  Constitutional: She appears well-developed and well-nourished.  HENT:  Head: Normocephalic.  Eyes: Scleral icterus is present.  Conjunctival pallor  Neck: Normal range of motion. Neck supple.  Cardiovascular: Normal rate and regular rhythm.  Murmur heard. Pulmonary/Chest: Effort normal and breath sounds normal. She has no wheezes. She has no rales. She exhibits no tenderness.  Abdominal: Soft. She exhibits no distension. There is no tenderness. There is no rebound and no guarding.  Musculoskeletal: Normal range of motion.  Neurological: She is alert.  CN's 3-12 grossly intact. Speech is clear and focused. No facial asymmetry. No lateralizing weakness. No deficits of coordination.    Skin: Skin is warm and dry. There is pallor.  Psychiatric: She has a normal mood and affect.     ED Treatments / Results  Labs (all  labs ordered are listed, but only abnormal results are displayed) Labs Reviewed  COMPREHENSIVE METABOLIC PANEL - Abnormal; Notable for the following components:      Result Value   Sodium 125 (*)    Chloride 92 (*)    Glucose, Bld 104 (*)    Total Protein 6.0 (*)    Total Bilirubin 4.3 (*)    GFR calc non Af Amer 54 (*)    All other components within normal limits  CBC - Abnormal; Notable for the following components:   RBC 2.25 (*)    Hemoglobin 7.9 (*)    HCT 22.7 (*)    MCV 100.9 (*)    MCH 35.1 (*)    RDW 21.5 (*)    All other components within normal limits  URINE CULTURE  PROTIME-INR  URINALYSIS, ROUTINE W REFLEX MICROSCOPIC  HEPATITIS PANEL, ACUTE  POC OCCULT BLOOD, ED  POC OCCULT BLOOD, ED  TYPE AND SCREEN    EKG None  Radiology No results found.  Procedures Procedures (including critical care time) CRITICAL CARE Performed by: Dewaine Oats   Total critical care time: 30 minutes  Critical care time was exclusive of separately billable procedures and treating other patients.  Critical care was necessary to treat or prevent imminent or life-threatening deterioration.  Critical care was time spent personally by me on the following activities: development of treatment plan with patient and/or surrogate as well as nursing, discussions with consultants, evaluation of patient's response to treatment, examination of patient, obtaining history from patient or surrogate, ordering and performing treatments and interventions, ordering and review of laboratory studies, ordering and review of radiographic studies, pulse oximetry and re-evaluation of patient's condition.  Medications Ordered in ED Medications  sodium chloride 0.9 % bolus 500 mL (has no administration in time range)     Initial Impression / Assessment and Plan / ED Course  I have reviewed the triage vital signs and the nursing notes.  Pertinent labs & imaging results that were available during my  care of the patient were reviewed by me and considered in my medical decision making (see chart for details).  Clinical Course as of May 13 2312  Tue May 13, 2018  2309 Potassium: 3.9 [PC]  Clinical Course User Index [PC] Cardama, Grayce Sessions, MD    Patient here with weakness that is generalized and progressive over 4 days. No fever, pain, cough, URI sxs, vomiting or diarrhea. She became jaundiced today and went to her doctor who sent her here when labs resulted as abnormal.   Blood pressure soft around 336 systolic. No fever. Hgb 7.9 (reported as 9.5 earlier today in office), bilirubin 4.3, sodium 125. IVF's running. NAD currently. Hemoccult negative.   Direct bili, LDH, haptoglobin, INR, CT abd/pel pending. DDx: hemolysis vs hepatitis vs pancreatic disease. Plan admission, [place] paged.   Patient is hemodynamically stable. No need to transfuse urgently.  11:50 - Discussed with Dr. Evangeline Gula, [facility], who advises to start transfusing. She is accepted onto [facility] service. Patient and family updated - all questions answered.  Final Clinical Impressions(s) / ED Diagnoses   Final diagnoses:  None   1. Anemia 2. Hyperbilirubinemia 3. Hyponatremia 4. Weakness  ED Discharge Orders    None       Charlann Lange, PA-C 05/14/18 0013    Charlann Lange, PA-C 05/14/18 0013    Fatima Blank, MD 05/14/18 2119

## 2018-05-14 ENCOUNTER — Encounter (HOSPITAL_COMMUNITY): Payer: Self-pay | Admitting: Radiology

## 2018-05-14 ENCOUNTER — Emergency Department (HOSPITAL_COMMUNITY): Payer: Medicare Other

## 2018-05-14 DIAGNOSIS — H543 Unqualified visual loss, both eyes: Secondary | ICD-10-CM | POA: Diagnosis present

## 2018-05-14 DIAGNOSIS — Z809 Family history of malignant neoplasm, unspecified: Secondary | ICD-10-CM | POA: Diagnosis not present

## 2018-05-14 DIAGNOSIS — Z885 Allergy status to narcotic agent status: Secondary | ICD-10-CM | POA: Diagnosis not present

## 2018-05-14 DIAGNOSIS — Z952 Presence of prosthetic heart valve: Secondary | ICD-10-CM | POA: Diagnosis not present

## 2018-05-14 DIAGNOSIS — N281 Cyst of kidney, acquired: Secondary | ICD-10-CM | POA: Diagnosis not present

## 2018-05-14 DIAGNOSIS — K831 Obstruction of bile duct: Secondary | ICD-10-CM

## 2018-05-14 DIAGNOSIS — I482 Chronic atrial fibrillation: Secondary | ICD-10-CM | POA: Diagnosis not present

## 2018-05-14 DIAGNOSIS — D591 Other autoimmune hemolytic anemias: Secondary | ICD-10-CM | POA: Diagnosis not present

## 2018-05-14 DIAGNOSIS — I69398 Other sequelae of cerebral infarction: Secondary | ICD-10-CM | POA: Diagnosis not present

## 2018-05-14 DIAGNOSIS — D649 Anemia, unspecified: Secondary | ICD-10-CM | POA: Diagnosis not present

## 2018-05-14 DIAGNOSIS — Z803 Family history of malignant neoplasm of breast: Secondary | ICD-10-CM | POA: Diagnosis not present

## 2018-05-14 DIAGNOSIS — E785 Hyperlipidemia, unspecified: Secondary | ICD-10-CM | POA: Diagnosis not present

## 2018-05-14 DIAGNOSIS — I10 Essential (primary) hypertension: Secondary | ICD-10-CM | POA: Diagnosis not present

## 2018-05-14 DIAGNOSIS — D62 Acute posthemorrhagic anemia: Principal | ICD-10-CM

## 2018-05-14 DIAGNOSIS — K5900 Constipation, unspecified: Secondary | ICD-10-CM | POA: Diagnosis not present

## 2018-05-14 DIAGNOSIS — Z951 Presence of aortocoronary bypass graft: Secondary | ICD-10-CM | POA: Diagnosis not present

## 2018-05-14 DIAGNOSIS — Z9049 Acquired absence of other specified parts of digestive tract: Secondary | ICD-10-CM | POA: Diagnosis not present

## 2018-05-14 DIAGNOSIS — D509 Iron deficiency anemia, unspecified: Secondary | ICD-10-CM | POA: Diagnosis not present

## 2018-05-14 DIAGNOSIS — I4891 Unspecified atrial fibrillation: Secondary | ICD-10-CM | POA: Diagnosis not present

## 2018-05-14 DIAGNOSIS — N390 Urinary tract infection, site not specified: Secondary | ICD-10-CM | POA: Diagnosis present

## 2018-05-14 DIAGNOSIS — R17 Unspecified jaundice: Secondary | ICD-10-CM | POA: Diagnosis not present

## 2018-05-14 DIAGNOSIS — I119 Hypertensive heart disease without heart failure: Secondary | ICD-10-CM | POA: Diagnosis not present

## 2018-05-14 DIAGNOSIS — Z881 Allergy status to other antibiotic agents status: Secondary | ICD-10-CM | POA: Diagnosis not present

## 2018-05-14 DIAGNOSIS — R531 Weakness: Secondary | ICD-10-CM | POA: Diagnosis not present

## 2018-05-14 DIAGNOSIS — H3411 Central retinal artery occlusion, right eye: Secondary | ICD-10-CM | POA: Diagnosis present

## 2018-05-14 DIAGNOSIS — Z954 Presence of other heart-valve replacement: Secondary | ICD-10-CM | POA: Diagnosis not present

## 2018-05-14 DIAGNOSIS — Z888 Allergy status to other drugs, medicaments and biological substances status: Secondary | ICD-10-CM | POA: Diagnosis not present

## 2018-05-14 DIAGNOSIS — Z8249 Family history of ischemic heart disease and other diseases of the circulatory system: Secondary | ICD-10-CM | POA: Diagnosis not present

## 2018-05-14 DIAGNOSIS — E039 Hypothyroidism, unspecified: Secondary | ICD-10-CM | POA: Diagnosis not present

## 2018-05-14 DIAGNOSIS — Z886 Allergy status to analgesic agent status: Secondary | ICD-10-CM | POA: Diagnosis not present

## 2018-05-14 DIAGNOSIS — Z7952 Long term (current) use of systemic steroids: Secondary | ICD-10-CM | POA: Diagnosis not present

## 2018-05-14 DIAGNOSIS — I2510 Atherosclerotic heart disease of native coronary artery without angina pectoris: Secondary | ICD-10-CM | POA: Diagnosis not present

## 2018-05-14 DIAGNOSIS — E871 Hypo-osmolality and hyponatremia: Secondary | ICD-10-CM | POA: Diagnosis present

## 2018-05-14 DIAGNOSIS — Z87891 Personal history of nicotine dependence: Secondary | ICD-10-CM | POA: Diagnosis not present

## 2018-05-14 DIAGNOSIS — Z7901 Long term (current) use of anticoagulants: Secondary | ICD-10-CM | POA: Diagnosis not present

## 2018-05-14 DIAGNOSIS — Z79899 Other long term (current) drug therapy: Secondary | ICD-10-CM | POA: Diagnosis not present

## 2018-05-14 DIAGNOSIS — Z91040 Latex allergy status: Secondary | ICD-10-CM | POA: Diagnosis not present

## 2018-05-14 HISTORY — DX: Obstruction of bile duct: K83.1

## 2018-05-14 HISTORY — DX: Unspecified jaundice: R17

## 2018-05-14 LAB — CBG MONITORING, ED: Glucose-Capillary: 123 mg/dL — ABNORMAL HIGH (ref 65–99)

## 2018-05-14 LAB — PROTIME-INR
INR: 1.89
Prothrombin Time: 21.5 seconds — ABNORMAL HIGH (ref 11.4–15.2)

## 2018-05-14 LAB — BASIC METABOLIC PANEL
Anion gap: 8 (ref 5–15)
BUN: 12 mg/dL (ref 6–20)
CO2: 25 mmol/L (ref 22–32)
Calcium: 9.6 mg/dL (ref 8.9–10.3)
Chloride: 99 mmol/L — ABNORMAL LOW (ref 101–111)
Creatinine, Ser: 0.81 mg/dL (ref 0.44–1.00)
GFR calc Af Amer: >60 mL/min (ref >60)
GFR calc non Af Amer: >60 mL/min (ref >60)
Glucose, Bld: 124 mg/dL — ABNORMAL HIGH (ref 65–99)
Potassium: 4.5 mmol/L (ref 3.5–5.1)
Sodium: 132 mmol/L — ABNORMAL LOW (ref 135–145)

## 2018-05-14 LAB — HEPATIC FUNCTION PANEL
ALT: 16 U/L (ref 14–54)
AST: 37 U/L (ref 15–41)
Albumin: 4.5 g/dL (ref 3.5–5.0)
Alkaline Phosphatase: 54 U/L (ref 38–126)
Bilirubin, Direct: 0.6 mg/dL — ABNORMAL HIGH (ref 0.1–0.5)
Indirect Bilirubin: 6.4 mg/dL — ABNORMAL HIGH (ref 0.3–0.9)
Total Bilirubin: 7 mg/dL — ABNORMAL HIGH (ref 0.3–1.2)
Total Protein: 6.9 g/dL (ref 6.5–8.1)

## 2018-05-14 LAB — CBC
HCT: 38.4 % (ref 36.0–46.0)
Hemoglobin: 13.1 g/dL (ref 12.0–15.0)
MCH: 31.8 pg (ref 26.0–34.0)
MCHC: 34.1 g/dL (ref 30.0–36.0)
MCV: 93.2 fL (ref 78.0–100.0)
Platelets: 199 10*3/uL (ref 150–400)
RBC: 4.12 MIL/uL (ref 3.87–5.11)
RDW: 19.8 % — ABNORMAL HIGH (ref 11.5–15.5)
WBC: 10.7 10*3/uL — ABNORMAL HIGH (ref 4.0–10.5)

## 2018-05-14 LAB — LACTATE DEHYDROGENASE: LDH: 289 U/L — ABNORMAL HIGH (ref 98–192)

## 2018-05-14 LAB — BILIRUBIN, DIRECT: Bilirubin, Direct: 0.4 mg/dL (ref 0.1–0.5)

## 2018-05-14 LAB — LIPASE, BLOOD: Lipase: 47 U/L (ref 11–51)

## 2018-05-14 MED ORDER — LEVOTHYROXINE SODIUM 88 MCG PO TABS
88.0000 ug | ORAL_TABLET | Freq: Every day | ORAL | Status: DC
  Administered 2018-05-14 – 2018-05-18 (×5): 88 ug via ORAL
  Filled 2018-05-14 (×6): qty 1

## 2018-05-14 MED ORDER — RISAQUAD PO CAPS
1.0000 | ORAL_CAPSULE | Freq: Every day | ORAL | Status: DC
  Administered 2018-05-14 – 2018-05-18 (×5): 1 via ORAL
  Filled 2018-05-14 (×6): qty 1

## 2018-05-14 MED ORDER — SODIUM CHLORIDE 0.9 % IV SOLN
1.0000 g | INTRAVENOUS | Status: DC
  Administered 2018-05-14 – 2018-05-15 (×2): 1 g via INTRAVENOUS
  Filled 2018-05-14 (×2): qty 10

## 2018-05-14 MED ORDER — WARFARIN - PHARMACIST DOSING INPATIENT
Freq: Every day | Status: DC
  Administered 2018-05-15 – 2018-05-16 (×2)

## 2018-05-14 MED ORDER — ONDANSETRON HCL 4 MG PO TABS: 4.0000 mg | ORAL_TABLET | Freq: Four times a day (QID) | ORAL | Status: DC | PRN

## 2018-05-14 MED ORDER — TRAMADOL HCL 50 MG PO TABS: 50.0000 mg | ORAL_TABLET | Freq: Four times a day (QID) | ORAL | Status: DC | PRN

## 2018-05-14 MED ORDER — HEPARIN (PORCINE) IN NACL 100-0.45 UNIT/ML-% IJ SOLN
900.0000 [IU]/h | INTRAMUSCULAR | Status: DC
  Filled 2018-05-14: qty 250

## 2018-05-14 MED ORDER — POLYETHYLENE GLYCOL 3350 17 G PO PACK
17.0000 g | PACK | Freq: Every day | ORAL | Status: DC | PRN
  Administered 2018-05-16: 17 g via ORAL
  Filled 2018-05-14: qty 1

## 2018-05-14 MED ORDER — CALCIUM CARBONATE-VITAMIN D 500-200 MG-UNIT PO TABS
1.0000 | ORAL_TABLET | Freq: Every day | ORAL | Status: DC
  Administered 2018-05-14 – 2018-05-18 (×5): 1 via ORAL
  Filled 2018-05-14 (×6): qty 1

## 2018-05-14 MED ORDER — IOHEXOL 300 MG/ML  SOLN
100.0000 mL | Freq: Once | INTRAMUSCULAR | Status: AC | PRN
  Administered 2018-05-14: 100 mL via INTRAVENOUS

## 2018-05-14 MED ORDER — POTASSIUM CHLORIDE IN NACL 20-0.9 MEQ/L-% IV SOLN
INTRAVENOUS | Status: AC
  Administered 2018-05-14 (×2): via INTRAVENOUS
  Filled 2018-05-14 (×2): qty 1000

## 2018-05-14 MED ORDER — LOSARTAN POTASSIUM 50 MG PO TABS
100.0000 mg | ORAL_TABLET | Freq: Every day | ORAL | Status: DC
  Administered 2018-05-14 – 2018-05-18 (×5): 100 mg via ORAL
  Filled 2018-05-14 (×5): qty 2

## 2018-05-14 MED ORDER — ATORVASTATIN CALCIUM 20 MG PO TABS
20.0000 mg | ORAL_TABLET | Freq: Every day | ORAL | Status: DC
  Administered 2018-05-14 – 2018-05-17 (×4): 20 mg via ORAL
  Filled 2018-05-14 (×7): qty 1

## 2018-05-14 MED ORDER — OXYBUTYNIN CHLORIDE 5 MG PO TABS
5.0000 mg | ORAL_TABLET | ORAL | Status: DC
  Administered 2018-05-14 – 2018-05-18 (×3): 5 mg via ORAL
  Filled 2018-05-14 (×3): qty 1

## 2018-05-14 MED ORDER — METOPROLOL TARTRATE 25 MG PO TABS
25.0000 mg | ORAL_TABLET | Freq: Two times a day (BID) | ORAL | Status: DC
  Administered 2018-05-14 – 2018-05-18 (×9): 25 mg via ORAL
  Filled 2018-05-14 (×9): qty 1

## 2018-05-14 MED ORDER — ADULT MULTIVITAMIN W/MINERALS CH
1.0000 | ORAL_TABLET | Freq: Every day | ORAL | Status: DC
  Administered 2018-05-14 – 2018-05-18 (×5): 1 via ORAL
  Filled 2018-05-14 (×5): qty 1

## 2018-05-14 MED ORDER — GABAPENTIN 100 MG PO CAPS
200.0000 mg | ORAL_CAPSULE | Freq: Three times a day (TID) | ORAL | Status: DC
  Administered 2018-05-14 – 2018-05-18 (×12): 200 mg via ORAL
  Filled 2018-05-14 (×12): qty 2

## 2018-05-14 MED ORDER — ALPRAZOLAM 0.25 MG PO TABS
0.2500 mg | ORAL_TABLET | Freq: Every day | ORAL | Status: DC
  Administered 2018-05-14 – 2018-05-17 (×4): 0.25 mg via ORAL
  Filled 2018-05-14 (×4): qty 1

## 2018-05-14 MED ORDER — GATIFLOXACIN 0.5 % OP SOLN: 1.0000 [drp] | Freq: Four times a day (QID) | OPHTHALMIC | Status: DC

## 2018-05-14 MED ORDER — DOCUSATE SODIUM 100 MG PO CAPS
100.0000 mg | ORAL_CAPSULE | Freq: Every day | ORAL | Status: DC
  Administered 2018-05-14: 100 mg via ORAL
  Filled 2018-05-14: qty 1

## 2018-05-14 MED ORDER — ONDANSETRON HCL 4 MG/2ML IJ SOLN: 4.0000 mg | Freq: Four times a day (QID) | INTRAMUSCULAR | Status: DC | PRN

## 2018-05-14 MED ORDER — WARFARIN SODIUM 7.5 MG PO TABS
7.5000 mg | ORAL_TABLET | Freq: Once | ORAL | Status: AC
  Administered 2018-05-14: 7.5 mg via ORAL
  Filled 2018-05-14 (×2): qty 1

## 2018-05-14 NOTE — ED Notes (Addendum)
Admitting has been paged X3 in regards to pt having auto-antibodies. Still waiting for response.  Dr. Daniel Nones, Dr. Cyd Silence.

## 2018-05-14 NOTE — Progress Notes (Signed)
ANTICOAGULATION CONSULT NOTE - Initial Consult  Pharmacy Consult for heparin Indication: AVR/Afib  Allergies  Allergen Reactions  . Ibuprofen Other (See Comments)    Hx of GI bleed  . Nucynta [Tapentadol] Other (See Comments)    Exact reaction not recalled, but knows she "cannot take"  . Ace Inhibitors Cough  . Doxycycline Nausea Only  . Hydrocodone Nausea Only  . Nsaids     Can only tolerate Tylenol (has a history of GI BLEEDS)  . Adhesive [Tape] Rash    Blisters (can use only paper tape)  . Latex Rash and Other (See Comments)    Paper tape only    Patient Measurements: Height: 5\' 3"  (160 cm) Weight: 145 lb (65.8 kg) IBW/kg (Calculated) : 52.4  Vital Signs: Temp: 97.5 F (36.4 C) (05/21 2113) Temp Source: Oral (05/21 2113) BP: 166/70 (05/22 0118) Pulse Rate: 90 (05/22 0118)  Labs: Recent Labs    05/13/18 2132 05/13/18 2255  HGB 7.9*  --   HCT 22.7*  --   PLT 264  --   LABPROT  --  28.2*  INR  --  2.66  CREATININE 0.92  --     Estimated Creatinine Clearance: 38.6 mL/min (by C-G formula based on SCr of 0.92 mg/dL).   Medical History: Past Medical History:  Diagnosis Date  . Aortic stenosis   . Coronary artery disease    Cath 1/13 left main  . 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 Dyspnea   . Hyperlipidemia   . Hypertension   . Hypertensive heart disease without CHF   . Hypothyroidism   . Lumbar disc disease   . Lung nodules    Sees Wert, thought to be benign  . Obesity   . Spinal stenosis   . Villous adenoma of rectum     Assessment: 84yo female c/o weakness, presented to PCP where labs showed UTI, low Hgb, hyponatremia, and high bili, admitted for blood-loss anemia, 2 units transfused, to hold Coumadin for Afib and AVR and start heparin when INR is subtherapeutic; last Coumadin dose was taken 5/20.  Goal of Therapy:  Heparin level 0.3-0.5 units/ml Monitor platelets by anticoagulation protocol: Yes   Plan:  Will  monitor INR and start heparin when appropriate.  Wynona Neat, PharmD, BCPS  05/14/2018,1:52 AM

## 2018-05-14 NOTE — ED Notes (Signed)
Update given to pt daughter via telephone

## 2018-05-14 NOTE — H&P (Signed)
History and Physical    Carol [name] LPF:[id number] DOB: 26-Feb-1928 DOA: 05/13/2018  PCP: Carol Austin, MD   Patient coming from: Home  I have personally briefly reviewed patient's old medical records in [place]  Chief Complaint: Sent in by her doctor due to abnormal blood work  HPI: Carol [name] is a 84 y.o. female with medical history significant of atrial fibrillation, HTN, CAD, HLD, thyroid disease, anticoagulation (Coumadin) for mechanical aortic valve, presents with 3-4 days of progressive, generalized weakness and lightheadedness. No syncope. No SOB, pain of any kind, vomiting, diarrhea. She describes some burning with urination but no fever or flank pain. She has intermittent mild nausea. She went to see her doctor today and was called this evening at home with report of abnormal labs, specifically (per daughter) hemoglobin of 9.5, sodium of 125, elevated bilirubin of 4.0. She was advised to come to the hospital for further evaluation and management.   Patient reports no family history of pancreatic disease that she knows of.  She personally has a history of aortic valve replacement.  She has had a 2 pound weight loss per her primary care physician.  Is being admitted into the hospital for further evaluation of painless jaundice with elevated bilirubin, hemoglobin of 7.9, and a sodium of 125.  The history is provided by the patient and a relative. No language interpreter was used.   Review of Systems: As per HPI otherwise all other systems reviewed and  negative.    Past Medical History:  Diagnosis Date  . Aortic stenosis   . Coronary artery disease    Cath 1/13 left main  . 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 Dyspnea   . Hyperlipidemia   . Hypertension   . Hypertensive heart disease without CHF   . Hypothyroidism   . Lumbar disc disease   . Lung nodules    Sees Wert, thought to be benign  . Obesity   . Spinal stenosis   .  Villous adenoma of rectum     Past Surgical History:  Procedure Laterality Date  . AORTIC VALVE REPLACEMENT  12/28/2011   Procedure: AORTIC VALVE REPLACEMENT (AVR);  Surgeon: Grace Isaac, MD;  Location: [facility];  Service: Open Heart Surgery;  Laterality: N/A;  . CARDIAC CATHETERIZATION  2013  . CHOLECYSTECTOMY    . CORONARY ARTERY BYPASS GRAFT  12/28/2011   Procedure: CORONARY ARTERY BYPASS GRAFTING (CABG);  Surgeon: Grace Isaac, MD;  Location: [facility];  Service: Open Heart Surgery;  Laterality: N/A;  . HERNIA REPAIR    . LEFT AND RIGHT HEART CATHETERIZATION WITH CORONARY ANGIOGRAM N/A 12/26/2011   Procedure: LEFT AND RIGHT HEART CATHETERIZATION WITH CORONARY ANGIOGRAM;  Surgeon: Jacolyn Reedy, MD;  Location: [facility] CATH LAB;  Service: Cardiovascular;  Laterality: N/A;  . LUMBAR LAMINECTOMY    . PARTIAL COLECTOMY  2008   Villous adenoma    Social History   Social History Narrative   Lives at [place]   Phone [phone]   [phone]   Caffeine use: Drinks coffee- 2 cups per day at the most per pt     reports that she quit smoking about 39 years ago. Her smoking use included cigarettes. She has a 5.00 pack-year smoking history. She has never used smokeless tobacco. She reports that she does not drink alcohol or use drugs.  Allergies  Allergen Reactions  . Ibuprofen Other (See Comments)    Hx of GI  bleed  . Nucynta [Tapentadol] Other (See Comments)    Exact reaction not recalled, but knows she "cannot take"  . Ace Inhibitors Cough  . Doxycycline Nausea Only  . Hydrocodone Nausea Only  . Nsaids     Can only tolerate Tylenol (has a history of GI BLEEDS)  . Adhesive [Tape] Rash    Blisters (can use only paper tape)  . Latex Rash and Other (See Comments)    Paper tape only    Family History  Problem Relation Age of Onset  . Lymphoma Sister   . Cancer Sister   . Breast cancer Sister   . Cancer Mother   . Heart attack Father   . Heart disease Father        Prior to Admission medications   Medication Sig Start Date End Date Taking? Authorizing Provider  acetaminophen (TYLENOL) 500 MG tablet Take 500 mg by mouth See admin instructions. Take 500 mg by mouth one to two times a day SCHEDULED   Yes [provider]  ALPRAZolam (XANAX) 0.25 MG tablet Take 0.25 mg by mouth at bedtime. 02/04/16  Yes [provider]  atorvastatin (LIPITOR) 20 MG tablet Take 20 mg by mouth at bedtime.     Yes [provider]  BESIVANCE 0.6 % SUSP Place 1 drop into the left eye See admin instructions. Place 1 drop left eye 4 times daily for 2 days after Avastin eye injections 11/23/16  Yes [provider]  Calcium Carbonate-Vitamin D (CALCIUM 600 + D PO) Take 1 tablet by mouth every morning.    Yes [provider]  docusate sodium (COLACE) 100 MG capsule Take 100 mg by mouth at bedtime.    Yes [provider]  gabapentin (NEURONTIN) 100 MG capsule TAKE 2 CAPSULES BY MOUTH 3 TIMES A DAY 06/24/15  Yes [provider]  levothyroxine (SYNTHROID, LEVOTHROID) 88 MCG tablet TAKE 1 TABLET BY MOUTH EVERY MORNING ON AN EMPTY STOMACH 06/28/15  Yes [provider]  losartan (COZAAR) 100 MG tablet Take 100 mg by mouth daily. 06/19/15  Yes [provider]  metoprolol tartrate (LOPRESSOR) 25 MG tablet Take 25 mg by mouth 2 (two) times daily. 03/02/16  Yes [provider]  Multiple Vitamin (MULITIVITAMIN WITH MINERALS) TABS Take 1 tablet by mouth daily.     Yes [provider]  oxybutynin (DITROPAN) 5 MG tablet Take 5 mg by mouth every other day.   Yes [provider]  polyethylene glycol (MIRALAX / GLYCOLAX) packet Take 17 g by mouth daily as needed for mild constipation.    Yes [provider]  Probiotic Product (PROBIOTIC PO) Take 1 capsule by mouth daily.   Yes [provider]  warfarin (COUMADIN) 1 MG tablet Take 1-3 mg by mouth See admin instructions. Take 1 mg by  mouth at bedtime on Sun/Mon/Wed/Fri/Sat and 2 mg on Tues and 3 mg on Thurs, in conjunction with one 5 mg tablet 03/14/16  Yes [provider]  warfarin (COUMADIN) 5 MG tablet Take 5 mg by mouth at bedtime.    Yes [provider]  amiodarone (PACERONE) 200 MG tablet Take 200 mg by mouth daily.  03/06/12  [provider]    Physical Exam:  Constitutional: NAD, calm, comfortable Vitals:   05/13/18 2200 05/13/18 2230 05/13/18 2330 05/14/18 0000  BP: (!) 102/42 (!) 99/45 (!) 130/51 136/74  Pulse: 63 63 (!) 55 85  Resp:  18 19 (!) 21  Temp:  TempSrc:      SpO2: 96% 97% 98% 100%   Eyes: PERRL, lids and conjunctivae normal, nonicteric ENMT: Mucous membranes are moist. Posterior pharynx clear of any exudate or lesions.Normal dentition.  Neck: normal, supple, no masses, no thyromegaly Respiratory: clear to auscultation bilaterally, no wheezing, no crackles. Normal respiratory effort. No accessory muscle use.  Cardiovascular: Regular rate and rhythm, no murmurs / rubs / gallops. No extremity edema. 2+ pedal pulses. No carotid bruits.  Abdomen: Protuberant, no tenderness, no masses palpated. No hepatosplenomegaly. Bowel sounds positive.  Musculoskeletal: no clubbing / cyanosis. No joint deformity upper and lower extremities. Good ROM, no contractures. Normal muscle tone.  Skin: no rashes, lesions, ulcers. No induration, slightly jaundiced Neurologic: CN 2-12 grossly intact. Sensation intact, DTR normal. Strength 5/5 in all 4.  Psychiatric: Normal judgment and insight. Alert and oriented x 3. Normal mood.    Labs on Admission: I have personally reviewed following labs and imaging studies  CBC: Recent Labs  Lab 05/13/18 2132  WBC 8.0  HGB 7.9*  HCT 22.7*  MCV 100.9*  PLT 540   Basic Metabolic Panel: Recent Labs  Lab 05/13/18 2132  NA 125*  K 3.9  CL 92*  CO2 23  GLUCOSE 104*  BUN 18  CREATININE 0.92  CALCIUM 9.0   GFR: CrCl cannot be calculated  (Unknown ideal weight.). Liver Function Tests: Recent Labs  Lab 05/13/18 2132  AST 33  ALT 15  ALKPHOS 47  BILITOT 4.3*  PROT 6.0*  ALBUMIN 4.0    Recent Labs  Lab 05/13/18 2255  INR 2.66   Urine analysis:    Component Value Date/Time   COLORURINE YELLOW 05/13/2018 2245   APPEARANCEUR HAZY (A) 05/13/2018 2245   LABSPEC 1.009 05/13/2018 2245   PHURINE 7.0 05/13/2018 2245   GLUCOSEU NEGATIVE 05/13/2018 2245   HGBUR NEGATIVE 05/13/2018 2245   BILIRUBINUR NEGATIVE 05/13/2018 2245   KETONESUR NEGATIVE 05/13/2018 2245   PROTEINUR NEGATIVE 05/13/2018 2245   UROBILINOGEN 0.2 12/27/2011 2237   NITRITE NEGATIVE 05/13/2018 2245   LEUKOCYTESUR LARGE (A) 05/13/2018 2245    Radiological Exams on Admission: No results found.  Fecal occult blood testing obtained in the emergency department was negative  Assessment/Plan Principal Problem:   Acute blood loss anemia Active Problems:   Hyponatremia   Elevated bilirubin   Obstructive jaundice   Hypertensive heart disease without CHF   S/P AVR and CABG   Atrial fibrillation (HCC)   Hyperlipidemia   Hypothyroidism   Long term (current) use of anticoagulants   1.  Acute blood loss anemia (probable given painless jaundice): Patient will be admitted into the hospital and transfused 2 units of packed red blood cells.  So far her stool testing was negative.    2.  Hyponatremia: Patient with a long history of hyponatremia.  Will avoid free water and allow patient to drink other fluids.  But currently she is n.p.o.  Will monitor serum sodiums.  3.  Elevated bilirubin with painless jaundice: This is very concerning for pancreatic cancer.  A CT scan of the abdomen pelvis is ordered.  Other causes include a retained stone but usually that the painful situation with an elevation in creatinine enzymes.  Lipase is ordered for the a.m.  If CT scan shows mass would then consider a CA-19-9 and a Ca1 25.  4.  Hypertensive heart disease without  CHF: Continue home medications.  6.  Status post mechanical aortic valve replacement coronary artery bypass graft: We will continue patient  on IV heparin drip hold her warfarin for now.  7.  Atrial fibrillation: As noted above.  Currently rate controlled.  8.  Hyperlipidemia: Continue home medication management.  9.  Hypothyroidism: Continue oral levothyroxine.  10.  Long-term current use of anticoagulants: Noted.    DVT prophylaxis: IV heparin Code Status: Full code.  Clearly if patient has a pancreatic mass CODE STATUS will need to be re-address. Family Communication: Spoke with patient and her daughter who was present during admission. Disposition Plan: Undetermined but likely discharge in 4 to 5 days Consults called: None Admission status: Inpatient   Lady Deutscher MD FACP [facility] Pager [phone]  If 7PM-7AM, please contact night-coverage [email or web address] Password [email or web address]  05/14/2018, 1:02 AM

## 2018-05-14 NOTE — Progress Notes (Signed)
Carol [name] is a 84 y.o. female with medical history significant of atrial fibrillation, HTN, CAD, HLD, thyroid disease, anticoagulation (Coumadin) for mechanical aortic valve, presents with 3-4 days of progressive, generalized weakness and lightheadedness. No syncope. No SOB, pain of any kind, vomiting, diarrhea. She describes some burning with urination but no fever or flank pain. She has intermittent mild nausea. She went to see her doctor today and was called this evening at home with report of abnormal labs, specifically (per daughter) hemoglobin of 9.5, sodium of 125, elevated bilirubin of 4.0. She was advised to come to the hospital for further evaluation and management.  Patient reports no family history of pancreatic disease that she knows of.  She personally has a history of aortic valve replacement.  She has had a 2 pound weight loss per her primary care physician.  Is being admitted into the hospital for further evaluation of painless jaundice with elevated bilirubin, hemoglobin of 7.9, and a sodium of 125.  The history is provided bythe patient and a relative.No language interpreter was used.  05/14/2018: Patient seen and examined at her bedside.  She has no new complaints.  She endorses mild abdominal discomfort with palpation. Urine analysis positive for pyuria.  Urine culture pending.  We will start the patient on IV ceftriaxone for UTI.  Patient was transfused 2 units of PRBCs this morning and her hemoglobin improved from 7.9 to 13.  FOBT negative.  CT abdomen and pelvis with contrast negative for any acute findings.  No significant intra-or extrahepatic biliary enlargement.  Total bilirubin elevated at 4.9 on 05/13/2018.  Repeat level.   Please refer to H&P dictated by Dr. Evangeline Gula on 05/14/2018 for further details of the assessment and plan.

## 2018-05-14 NOTE — Progress Notes (Addendum)
[place] for heparin Indication: AVR/Afib  Allergies  Allergen Reactions  . Ibuprofen Other (See Comments)    Hx of GI bleed  . Nucynta [Tapentadol] Other (See Comments)    Exact reaction not recalled, but knows she "cannot take"  . Ace Inhibitors Cough  . Doxycycline Nausea Only  . Hydrocodone Nausea Only  . Nsaids     Can only tolerate Tylenol (has a history of GI BLEEDS)  . Adhesive [Tape] Rash    Blisters (can use only paper tape)  . Latex Rash and Other (See Comments)    Paper tape only    Patient Measurements: Height: 5\' 3"  (160 cm) Weight: 145 lb (65.8 kg) IBW/kg (Calculated) : 52.4  Vital Signs: Temp: 97.6 F (36.4 C) (05/22 0758) Temp Source: Oral (05/22 0758) BP: 174/81 (05/22 0802) Pulse Rate: 101 (05/22 0802)  Labs: Recent Labs    05/13/18 2132 05/13/18 2255 05/14/18 0839  HGB 7.9*  --  13.1  HCT 22.7*  --  38.4  PLT 264  --  199  LABPROT  --  28.2* 21.5*  INR  --  2.66 1.89  CREATININE 0.92  --  0.81    Estimated Creatinine Clearance: 43.8 mL/min (by C-G formula based on SCr of 0.81 mg/dL).  Assessment: 84yo female c/o weakness, presented to PCP where labs showed UTI, low Hgb, hyponatremia, and high bili, admitted for blood-loss anemia, 2 units transfused, to hold Coumadin for Afib and AVR. INR is now subtherapeutic. No bleeding noted.   Goal of Therapy:  Heparin level 0.3-0.5 units/ml Monitor platelets by anticoagulation protocol: Yes   Plan:  Heparin gtt 900 units/hr Check an 8 hr heparin level Daily heparin level and CBC F/u warfarin restart  Salome Arnt, PharmD, BCPS 05/14/2018 9:20 AM  Addendum: MD dc'd heparin and now restarting warfarin. Will give 7.5mg  warfarin PO x 1 now. Will need to evaluate need for heparin if INR continues to decrease.   Salome Arnt, PharmD, BCPS Phone #: [phone] until 3pm All other times, call [facility] x [phone] 05/14/2018 10:39 AM

## 2018-05-14 NOTE — ED Notes (Signed)
Nehemiah Settle PA (EDP) and Dr. Tyrone Nine has been made aware of auto-antibodies in blood, aware pathologist has OKed pt to receive blood. Shari PA informed since floor coverage still has not called back.

## 2018-05-14 NOTE — ED Triage Notes (Signed)
PT confused and attempting to get out of bed. Pt moved to room 12 closer to desk.

## 2018-05-14 NOTE — ED Notes (Signed)
ORDERED DIET TRAY FOR PT  

## 2018-05-14 NOTE — ED Notes (Signed)
Place purwick onpatient

## 2018-05-14 NOTE — ED Triage Notes (Signed)
BED alarm set to prevent falls

## 2018-05-14 NOTE — ED Triage Notes (Signed)
Please call Daughter Allena Napoleon CELL- [phone]

## 2018-05-14 NOTE — Progress Notes (Signed)
Pt roomed to 6N17 at 1855hrs.

## 2018-05-15 DIAGNOSIS — I10 Essential (primary) hypertension: Secondary | ICD-10-CM

## 2018-05-15 DIAGNOSIS — Z954 Presence of other heart-valve replacement: Secondary | ICD-10-CM

## 2018-05-15 DIAGNOSIS — D591 Autoimmune hemolytic anemia, unspecified: Secondary | ICD-10-CM

## 2018-05-15 DIAGNOSIS — Z7901 Long term (current) use of anticoagulants: Secondary | ICD-10-CM

## 2018-05-15 DIAGNOSIS — D649 Anemia, unspecified: Secondary | ICD-10-CM

## 2018-05-15 DIAGNOSIS — Z809 Family history of malignant neoplasm, unspecified: Secondary | ICD-10-CM

## 2018-05-15 DIAGNOSIS — E785 Hyperlipidemia, unspecified: Secondary | ICD-10-CM

## 2018-05-15 DIAGNOSIS — Z87891 Personal history of nicotine dependence: Secondary | ICD-10-CM

## 2018-05-15 DIAGNOSIS — I119 Hypertensive heart disease without heart failure: Secondary | ICD-10-CM

## 2018-05-15 DIAGNOSIS — Z803 Family history of malignant neoplasm of breast: Secondary | ICD-10-CM

## 2018-05-15 DIAGNOSIS — D509 Iron deficiency anemia, unspecified: Secondary | ICD-10-CM

## 2018-05-15 DIAGNOSIS — E039 Hypothyroidism, unspecified: Secondary | ICD-10-CM

## 2018-05-15 DIAGNOSIS — I482 Chronic atrial fibrillation: Secondary | ICD-10-CM

## 2018-05-15 DIAGNOSIS — I4891 Unspecified atrial fibrillation: Secondary | ICD-10-CM

## 2018-05-15 DIAGNOSIS — I2510 Atherosclerotic heart disease of native coronary artery without angina pectoris: Secondary | ICD-10-CM

## 2018-05-15 LAB — SAVE SMEAR

## 2018-05-15 LAB — COMPREHENSIVE METABOLIC PANEL
ALT: 17 U/L (ref 14–54)
AST: 40 U/L (ref 15–41)
Albumin: 4.2 g/dL (ref 3.5–5.0)
Alkaline Phosphatase: 48 U/L (ref 38–126)
Anion gap: 10 (ref 5–15)
BUN: 14 mg/dL (ref 6–20)
CO2: 22 mmol/L (ref 22–32)
Calcium: 9.2 mg/dL (ref 8.9–10.3)
Chloride: 101 mmol/L (ref 101–111)
Creatinine, Ser: 0.74 mg/dL (ref 0.44–1.00)
GFR calc Af Amer: >60 mL/min (ref >60)
GFR calc non Af Amer: >60 mL/min (ref >60)
Glucose, Bld: 95 mg/dL (ref 65–99)
Potassium: 4.3 mmol/L (ref 3.5–5.1)
Sodium: 133 mmol/L — ABNORMAL LOW (ref 135–145)
Total Bilirubin: 5.7 mg/dL — ABNORMAL HIGH (ref 0.3–1.2)
Total Protein: 5.9 g/dL — ABNORMAL LOW (ref 6.5–8.1)

## 2018-05-15 LAB — CBC
HCT: 34.3 % — ABNORMAL LOW (ref 36.0–46.0)
Hemoglobin: 11.7 g/dL — ABNORMAL LOW (ref 12.0–15.0)
MCH: 33.1 pg (ref 26.0–34.0)
MCHC: 34.1 g/dL (ref 30.0–36.0)
MCV: 97.2 fL (ref 78.0–100.0)
Platelets: 166 10*3/uL (ref 150–400)
RBC: 3.53 MIL/uL — ABNORMAL LOW (ref 3.87–5.11)
RDW: 22.5 % — ABNORMAL HIGH (ref 11.5–15.5)
WBC: 8.8 10*3/uL (ref 4.0–10.5)

## 2018-05-15 LAB — TYPE AND SCREEN
ABO/RH(D): O POS
Antibody Screen: POSITIVE
DAT, IgG: POSITIVE
Unit division: 0
Unit division: 0

## 2018-05-15 LAB — HAPTOGLOBIN: Haptoglobin: <10 mg/dL — ABNORMAL LOW (ref 34–200)

## 2018-05-15 LAB — BPAM RBC
Blood Product Expiration Date: 201905282359
Blood Product Expiration Date: 201905282359
ISSUE DATE / TIME: 201905220246
ISSUE DATE / TIME: 201905220448
Unit Type and Rh: 5100
Unit Type and Rh: 5100

## 2018-05-15 LAB — HEPATITIS PANEL, ACUTE
HCV Ab: <0.1 s/co ratio (ref 0.0–0.9)
Hep A IgM: NEGATIVE
Hep B C IgM: NEGATIVE
Hepatitis B Surface Ag: NEGATIVE

## 2018-05-15 LAB — GLUCOSE, CAPILLARY: Glucose-Capillary: 97 mg/dL (ref 65–99)

## 2018-05-15 LAB — URINE CULTURE: Culture: NO GROWTH

## 2018-05-15 LAB — DIRECT ANTIGLOBULIN TEST (NOT AT ARMC)
DAT, IgG: POSITIVE
DAT, complement: POSITIVE

## 2018-05-15 LAB — PROTIME-INR
INR: 2.23
Prothrombin Time: 24.5 seconds — ABNORMAL HIGH (ref 11.4–15.2)

## 2018-05-15 LAB — LACTATE DEHYDROGENASE: LDH: 433 U/L — ABNORMAL HIGH (ref 98–192)

## 2018-05-15 MED ORDER — DOCUSATE SODIUM 100 MG PO CAPS
200.0000 mg | ORAL_CAPSULE | Freq: Every day | ORAL | Status: DC
  Administered 2018-05-15 – 2018-05-17 (×3): 200 mg via ORAL
  Filled 2018-05-15 (×3): qty 2

## 2018-05-15 MED ORDER — PREDNISONE 50 MG PO TABS
60.0000 mg | ORAL_TABLET | Freq: Every day | ORAL | Status: DC
  Administered 2018-05-15 – 2018-05-16 (×2): 60 mg via ORAL
  Filled 2018-05-15 (×2): qty 1

## 2018-05-15 MED ORDER — CEPHALEXIN 250 MG PO CAPS: 250.0000 mg | ORAL_CAPSULE | Freq: Two times a day (BID) | ORAL | 0 refills | Status: DC

## 2018-05-15 MED ORDER — WARFARIN SODIUM 4 MG PO TABS
8.0000 mg | ORAL_TABLET | Freq: Once | ORAL | Status: AC
Start: 2018-05-15 — End: 2018-05-15
  Administered 2018-05-15: 8 mg via ORAL
  Filled 2018-05-15: qty 2

## 2018-05-15 NOTE — Evaluation (Signed)
Physical Therapy Evaluation Patient Details Name: Carol [name] MRN: [id number] DOB: [birth year] Today's Date: 05/15/2018   History of Present Illness  Pt. is a 84 y.o. F with significant PMH of atrial fibrillation, HTN, CAD, HLD, thyroid disease, anticoagulation (Coumadin) for mechanical aortic valve, who presents with 3-4 days of progerssive, generalized weakness and lightheadedness. Admitted with acute blood loss anemia and hyponatremia.  Clinical Impression  Patient evaluated by Physical Therapy with no further acute PT needs identified. All education has been completed and the patient has no further questions. Patient ambulating 180 feet with Rollator and supervision. Demonstrates decreased functional mobility compared to baseline secondary to weak proximal lower extremity strength, decreased endurance, and diminished balance. Patient reports she has previously ambulated twice today (up to 500 feet) with granddaughter and staff. All further PT needs can be addressed in next venue of care. See below for any follow-up Physical Therapy needs. PT is signing off. Thank you for this referral.     Follow Up Recommendations Home health PT (at [facility])    Equipment Recommendations  None recommended by PT    Recommendations for Other Services       Precautions / Restrictions Precautions Precautions: Fall Restrictions Weight Bearing Restrictions: No      Mobility  Bed Mobility Overal bed mobility: Modified Independent             General bed mobility comments: Modified independent for sit to supine  Transfers Overall transfer level: Modified independent Equipment used: 4-wheeled walker                Ambulation/Gait Ambulation/Gait assistance: Supervision Ambulation Distance (Feet): 180 Feet Assistive device: 4-wheeled walker Gait Pattern/deviations: Step-through pattern;Decreased stride length   Gait velocity interpretation: <1.31 ft/sec, indicative of household  ambulator General Gait Details: DOE 1/4 with mobility; SpO2 93% on RA.   Stairs            Wheelchair Mobility    Modified Rankin (Stroke Patients Only)       Balance Overall balance assessment: Mild deficits observed, not formally tested                                           Pertinent Vitals/Pain Pain Assessment: No/denies pain    Home Living Family/patient expects to be discharged to:: Private residence Living Arrangements: Alone Available Help at Discharge: Family;Friend(s) Type of Home: Independent living facility([facility]) Home Access: Level entry     Home Layout: One level Home Equipment: Environmental consultant - 4 wheels;Cane - single point      Prior Function Level of Independence: Independent with assistive device(s)         Comments: Uses Rollator for ambulation to dining hall. Occasionally uses SPC for ambulation in home. 1x/week has assistance for "heavy" cleaning i.e. vacuuming.     Hand Dominance        Extremity/Trunk Assessment   Upper Extremity Assessment Upper Extremity Assessment: Overall WFL for tasks assessed    Lower Extremity Assessment Lower Extremity Assessment: RLE deficits/detail;LLE deficits/detail RLE Deficits / Details: Overall 5/5 except hip flexion 4/5 LLE Deficits / Details: Overal 5/5 except hip flexion 4/5    Cervical / Trunk Assessment Cervical / Trunk Assessment: Kyphotic  Communication   Communication: No difficulties  Cognition Arousal/Alertness: Awake/alert Behavior During Therapy: WFL for tasks assessed/performed Overall Cognitive Status: Within Functional Limits for tasks assessed  General Comments General comments (skin integrity, edema, etc.): 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 Benefits from use of large print for written handouts/instructions.     Exercises     Assessment/Plan    PT  Assessment Patent does not need any further PT services;All further PT needs can be met in the next venue of care  PT Problem List Decreased balance;Decreased mobility       PT Treatment Interventions      PT Goals (Current goals can be found in the Care Plan section)  Acute Rehab PT Goals Patient Stated Goal: get strength back PT Goal Formulation: All assessment and education complete, DC therapy    Frequency     Barriers to discharge        Co-evaluation               AM-PAC PT "6 Clicks" Daily Activity  Outcome Measure Difficulty turning over in bed (including adjusting bedclothes, sheets and blankets)?: None Difficulty moving from lying on back to sitting on the side of the bed? : None Difficulty sitting down on and standing up from a chair with arms (e.g., wheelchair, bedside commode, etc,.)?: None Help needed moving to and from a bed to chair (including a wheelchair)?: A Little Help needed walking in hospital room?: A Little Help needed climbing 3-5 steps with a railing? : A Little 6 Click Score: 21    End of Session   Activity Tolerance: Patient tolerated treatment well Patient left: in bed;with call bell/phone within reach Nurse Communication: Mobility status PT Visit Diagnosis: Unsteadiness on feet (R26.81);Muscle weakness (generalized) (M62.81)    Time: 9326-7124 PT Time Calculation (min) (ACUTE ONLY): 23 min   Charges:   PT Evaluation $PT Eval Moderate Complexity: 1 Mod PT Treatments $Therapeutic Activity: 8-22 mins   PT G Codes:        Ellamae Sia, PT, DPT Acute Rehabilitation Services  Pager: [place] 05/15/2018, 5:12 PM

## 2018-05-15 NOTE — Social Work (Addendum)
Pt is from independent living at [facility], no FL2 needed.  CSW called to confirm this with healthcare CSW Aggie Hacker at [facility].   1:19pm- Pt is from [facility], the med tech who RN Case Manager will contact is Jori Moll [phone], Nira Conn- RN Case Manager verbally given this information.   CSW signing off. Please consult if any additional needs arise.  Alexander Mt, [place] Work [phone]

## 2018-05-15 NOTE — Discharge Summary (Addendum)
Progress Note  Carol [name] UKG:[id number] DOB: 1928-12-08  PCP: Darcus Austin, MD  Admit date: 05/13/2018  Time spent: 25 minutes  Spoke with hematologist Dr Lebron Conners who will see the patient for suspected hemolytic anemia. Hemolytic anemia confirmed. Hematology will start IV steroids and workup initiated.  Recommendations for Outpatient Follow-up:   1.   Follow up with hematology  2.  Follow-up with GI.  Called [facility] office and spoke with Reyne Dumas who will call you for a scheduled appointment. 3.   Follow-up with PCP 4.  Take your medications as prescribed  Discharge Diagnoses:  Active Hospital Problems   Diagnosis Date Noted  . Acute blood loss anemia 05/14/2018  . Elevated bilirubin 05/14/2018  . Obstructive jaundice 05/14/2018  . Long term (current) use of anticoagulants 07/14/2013  . Hyponatremia 01/17/2012  . Atrial fibrillation ([place])   . 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 Hypertensive heart disease without CHF   . Hyperlipidemia   . Hypothyroidism     Resolved Hospital Problems  No resolved problems to display.    Diet recommendation: Resume previous diet  Vitals:   05/14/18 2121 05/15/18 0457  BP: (!) 161/72 (!) 162/65  Pulse: 82 68  Resp:    Temp: 98 F (36.7 C) 98 F (36.7 C)  SpO2: 98% 98%    History of present illness:  Carol [name] a 84 y.o.femalewith medical history significant ofatrial fibrillation, HTN, CAD, HLD, thyroid disease,anticoagulation(Coumadin)for mechanical aortic valve, presents with 3-4 days of progressive, generalized weakness and lightheadedness. No syncope. No SOB, pain of any kind, vomiting, diarrhea. She describes some burning with urination but no fever or flank pain. She has intermittent mild nausea. She went to see her doctor today and was called this evening at home with report of abnormal labs, specifically (per daughter) hemoglobin of 9.5, sodium of 125, elevated bilirubin of 4.0. She was advised to  come to the hospital for further evaluation and management.Patient reports no family history of pancreatic disease that she knows of. She personally has a history of aortic valve replacement. She has had a 2 pound weight loss per her primary care physician. Is being admitted into the hospital for further evaluation of painless jaundice with elevated bilirubin, hemoglobin of 7.9, and a sodium of 125.  The history is provided bythe patient and a relative.No language interpreter was used.  05/14/2018: Patient seen and examined at her bedside.  She has no new complaints.  She endorses mild abdominal discomfort with palpation. Urine analysis positive for pyuria.  Urine culture pending.  We will start the patient on IV ceftriaxone for UTI.  Patient was transfused 2 units of PRBCs this morning and her hemoglobin improved from 7.9 to 13.  FOBT negative.  CT abdomen and pelvis with contrast negative for any acute findings.  No significant intra-or extrahepatic biliary enlargement.  Total bilirubin elevated at 4.9 on 05/13/2018.  Repeat level.   05/15/2018: Patient seen and examined at bedside.  She denies any abdominal pain or nausea.  Repeated total bilirubin was persistently elevated but trending down.  AST, ALT, alkaline phosphatase normal.  The patient is asymptomatic CT abdomen pelvis with contrast revealed no acute findings. Elevated LDH and low haptoglobin. Hematology consulted due to suspected hemolytic anemia. Highly appreciated.  Hospital Course:  Principal Problem:   Acute blood loss anemia Active Problems:   Hypertensive heart disease without CHF   Hyperlipidemia   Hypothyroidism   S/P AVR and CABG   Hyponatremia  Atrial fibrillation ([place])   Long term (current) use of anticoagulants   Elevated bilirubin   Obstructive jaundice  1. Symptomatic anemia/suspected hemolytic anemia Unclear etiology Transfused 2U prbcs 05/14/18. Symptoms improving Elevated LDH and low  haptoglobin Suspect hemolytic anemia Dr Lebron Conners consulted. Hg 11.7 from 13 Hg on admission 7.9.  Baseline Hg 12-13 FOBT negative on 05/13/18  2. Generalized weakness, improving Encouraged to increase po calorie intake PT to evaluate  3. Hyponatremia Improving Na+ 133  4.Elevated total bilirubin/indirect bilirubin Total bilirubin is trending down CT abd pelvis w contrast unremarkable for any significant dilatation or acute findings. AST ALT Alk phosphatase, lipase unremarkable. Suspect hemolytic anemia with other markers.  5. Chronic a-fib on coumadin Rate controlled INR is therapeutic  6. UTI, poa Received IV rocephin Switched to keflex x 5 days Afebrile No leukocytosis   Procedures:  None  Consultations:  Hematology/oncology  Discharge Exam: BP (!) 162/65   Pulse 68   Temp 98 F (36.7 C) (Oral)   Resp 18   Ht '5\' 3"'  (1.6 m)   Wt 65.8 kg (145 lb)   SpO2 98%   BMI 25.69 kg/m  . General: 84 y.o. year-old female well developed well nourished in no acute distress.  Alert and oriented x3. . Cardiovascular: Regular rate and rhythm with no rubs or gallops.  No thyromegaly or JVD noted.   Marland Kitchen Respiratory: Clear to auscultation with no wheezes or rales. Good inspiratory effort. . Abdomen: Soft nontender nondistended with normal bowel sounds x4 quadrants. . Musculoskeletal: No lower extremity edema. 2/4 pulses in all 4 extremities. . Skin: No ulcerative lesions noted or rashes, . Psychiatry: Mood is appropriate for condition and setting  Discharge Instructions You were cared for by a hospitalist during your hospital stay. If you have any questions about your discharge medications or the care you received while you were in the hospital after you are discharged, you can call the unit and asked to speak with the hospitalist on call if the hospitalist that took care of you is not available. Once you are discharged, your primary care physician will handle any further  medical issues. Please note that NO REFILLS for any discharge medications will be authorized once you are discharged, as it is imperative that you return to your primary care physician (or establish a relationship with a primary care physician if you do not have one) for your aftercare needs so that they can reassess your need for medications and monitor your lab values.   Allergies as of 05/15/2018      Reactions   Ibuprofen Other (See Comments)   Hx of GI bleed   Nucynta [tapentadol] Other (See Comments)   Exact reaction not recalled, but knows she "cannot take"   Ace Inhibitors Cough   Doxycycline Nausea Only   Hydrocodone Nausea Only   Nsaids    Can only tolerate Tylenol (has a history of GI BLEEDS)   Adhesive [tape] Rash   Blisters (can use only paper tape)   Latex Rash, Other (See Comments)   Paper tape only      Medication List    STOP taking these medications   acetaminophen 500 MG tablet Commonly known as:  TYLENOL   losartan 100 MG tablet Commonly known as:  COZAAR     TAKE these medications   ALPRAZolam 0.25 MG tablet Commonly known as:  XANAX Take 0.25 mg by mouth at bedtime.   atorvastatin 20 MG tablet Commonly known as:  LIPITOR Take 20  mg by mouth at bedtime.   BESIVANCE 0.6 % Susp Generic drug:  Besifloxacin HCl Place 1 drop into the left eye See admin instructions. Place 1 drop left eye 4 times daily for 2 days after Avastin eye injections   CALCIUM 600 + D PO Take 1 tablet by mouth every morning.   cephALEXin 250 MG capsule Commonly known as:  KEFLEX Take 1 capsule (250 mg total) by mouth 2 (two) times daily for 5 days.   docusate sodium 100 MG capsule Commonly known as:  COLACE Take 100 mg by mouth at bedtime.   gabapentin 100 MG capsule Commonly known as:  NEURONTIN TAKE 2 CAPSULES BY MOUTH 3 TIMES A DAY   levothyroxine 88 MCG tablet Commonly known as:  SYNTHROID, LEVOTHROID TAKE 1 TABLET BY MOUTH EVERY MORNING ON AN EMPTY STOMACH    metoprolol tartrate 25 MG tablet Commonly known as:  LOPRESSOR Take 25 mg by mouth 2 (two) times daily.   multivitamin with minerals Tabs tablet Take 1 tablet by mouth daily.   oxybutynin 5 MG tablet Commonly known as:  DITROPAN Take 5 mg by mouth every other day.   polyethylene glycol packet Commonly known as:  MIRALAX / GLYCOLAX Take 17 g by mouth daily as needed for mild constipation.   PROBIOTIC PO Take 1 capsule by mouth daily.   warfarin 5 MG tablet Commonly known as:  COUMADIN Take as directed. If you are unsure how to take this medication, talk to your nurse or doctor. Original instructions:  Take 5 mg by mouth at bedtime.   warfarin 1 MG tablet Commonly known as:  COUMADIN Take as directed. If you are unsure how to take this medication, talk to your nurse or doctor. Original instructions:  Take 1-3 mg by mouth See admin instructions. Take 1 mg by mouth at bedtime on Sun/Mon/Wed/Fri/Sat and 2 mg on Tues and 3 mg on Thurs, in conjunction with one 5 mg tablet      Allergies  Allergen Reactions  . Ibuprofen Other (See Comments)    Hx of GI bleed  . Nucynta [Tapentadol] Other (See Comments)    Exact reaction not recalled, but knows she "cannot take"  . Ace Inhibitors Cough  . Doxycycline Nausea Only  . Hydrocodone Nausea Only  . Nsaids     Can only tolerate Tylenol (has a history of GI BLEEDS)  . Adhesive [Tape] Rash    Blisters (can use only paper tape)  . Latex Rash and Other (See Comments)    Paper tape only      The results of significant diagnostics from this hospitalization (including imaging, microbiology, ancillary and laboratory) are listed below for reference.    Significant Diagnostic Studies: Ct Abdomen Pelvis W Contrast  Result Date: 05/14/2018 CLINICAL DATA:  Elevated bilirubin EXAM: CT ABDOMEN AND PELVIS WITH CONTRAST TECHNIQUE: Multidetector CT imaging of the abdomen and pelvis was performed using the standard protocol following bolus  administration of intravenous contrast. CONTRAST:  153m OMNIPAQUE IOHEXOL 300 MG/ML  SOLN COMPARISON:  PET-CT 11/20/2011, CT abdomen pelvis 01/23/2008 FINDINGS: Lower chest: Lung bases demonstrate no acute consolidation or pleural effusion. There is cardiomegaly. Small distal esophageal hernia. Hepatobiliary: No focal hepatic abnormality. Status post cholecystectomy. No significant intra or extrahepatic biliary enlargement. Pancreas: Unremarkable. No pancreatic ductal dilatation or surrounding inflammatory changes. Spleen: Normal in size without focal abnormality. Adrenals/Urinary Tract: Adrenal glands are within normal limits. Cysts within the bilateral kidneys including right parapelvic cysts. Prominent extrarenal pelvis left greater than  right, without ureteral stone. The bladder is Stomach/Bowel: Stomach is nonenlarged. No dilated small bowel. No colon wall thickening. Postsurgical changes at the cecum and rectosigmoid colon. Vascular/Lymphatic: Severe aortic atherosclerosis without aneurysmal dilatation. No significantly enlarged lymph nodes. Reproductive: Uterus and bilateral adnexa are unremarkable. Other: Negative for free air or free fluid. Interval right lower quadrant ventral hernia repair. Musculoskeletal: Scoliosis and degenerative changes of the spine. Laminectomy changes at L4-L5. IMPRESSION: 1. Status post cholecystectomy. No significant intra or extrahepatic biliary enlargement. 2. There are no acute abnormalities visualize within the abdomen or pelvis 3. Bilateral renal cysts Electronically Signed   By: Donavan Foil M.D.   On: 05/14/2018 01:42    Microbiology: Recent Results (from the past 240 hour(s))  Urine culture     Status: None   Collection Time: 05/13/18 11:00 PM  Result Value Ref Range Status   Specimen Description URINE, RANDOM  Final   Special Requests NONE  Final   Culture   Final    NO GROWTH Performed at [facility], [address] [place], [place] [address]     Report Status 05/15/2018 FINAL  Final     Labs: Basic Metabolic Panel: Recent Labs  Lab 05/13/18 2132 05/14/18 0839 05/15/18 0546  NA 125* 132* 133*  K 3.9 4.5 4.3  CL 92* 99* 101  CO2 '23 25 22  ' GLUCOSE 104* 124* 95  BUN '18 12 14  ' CREATININE 0.92 0.81 0.74  CALCIUM 9.0 9.6 9.2   Liver Function Tests: Recent Labs  Lab 05/13/18 2132 05/14/18 0837 05/15/18 0546  AST 33 37 40  ALT '15 16 17  ' ALKPHOS 47 54 48  BILITOT 4.3* 7.0* 5.7*  PROT 6.0* 6.9 5.9*  ALBUMIN 4.0 4.5 4.2   Recent Labs  Lab 05/14/18 0839  LIPASE 47   No results for input(s): AMMONIA in the last 168 hours. CBC: Recent Labs  Lab 05/13/18 2132 05/14/18 0839 05/15/18 0546  WBC 8.0 10.7* 8.8  HGB 7.9* 13.1 11.7*  HCT 22.7* 38.4 34.3*  MCV 100.9* 93.2 97.2  PLT 264 199 166   Cardiac Enzymes: No results for input(s): CKTOTAL, CKMB, CKMBINDEX, TROPONINI in the last 168 hours. BNP: BNP (last 3 results) No results for input(s): BNP in the last 8760 hours.  ProBNP (last 3 results) No results for input(s): PROBNP in the last 8760 hours.  CBG: Recent Labs  Lab 05/14/18 0837 05/15/18 0740  GLUCAP 123* 97       Signed:  Kayleen Memos, MD [facility] 05/15/2018, 12:17 PM

## 2018-05-15 NOTE — Consult Note (Signed)
New Hematology/Oncology Consult   Referral MD: Dr Kayleen Memos  Reason for Referral: Hemolytic anemia  HPI:  Carol [name] is a 84 y.o. with past medical history is significant for atrial fibrillation chronic anticoagulation with warfarin, hypertension, coronary artery disease, hyperlipidemia, hypothyroidism and mechanical aortic valve.  Patient presented yesterday with 3 to 4 days of lightheadedness and progressive generalized weakness.  No associated respiratory, cardiac symptoms.  Patient had some nausea, but no vomiting, no constipation.  She does have chronic intermittent diarrhea which has not changed its character.  Patient had no melena or hematochezia.  No dysuria or hematuria, but has been having urinary frequency in the past.  Lab work obtained at the primary care provider was reported to be significant for hyponatremia with sodium of 125 and acute drop of hemoglobin down to 9.5 from previous value exceeding 12 in January 2019.  Concurrently, elevation of bilirubin was noted and patient was sent to the emergency room for further assessment.  On presentation on 05/13/2018, patient had white blood cell count of 8.0, no differential, hemoglobin of 7.9 and platelets of 264.  RDW was elevated at 21.5 and MCV was 100.9.  Total bilirubin was measured at 4.3 and LDH was 289 with haptoglobin undetectable.  Patient has received 2 units of packed red blood cells on admission.  Bank testing demonstrated presence of warm autoantibodies.  Additionally, urinalysis obtained on admission was positive for pyuria and patient was started on ceftriaxone for suspected urinary tract infection.  Yesterday, bilirubin went up to 7.0 with indirect fraction of 6.4.  Hemoglobin responded to transfusion and most recent hemoglobin is 11.7, although it is down from 13.1 yesterday.  At the present time, patient continues to complain of generalized fatigue and jaundice.  Patient denies fevers, chills, night sweats.  She  denies any significant unexpected weight loss, but her appetite has been below her normal for a period of time.  Denies shortness of breath at rest or chest pain.  No cough or hemoptysis.  Currently, no nausea, vomiting, abdominal pain, diarrhea, or constipation.  Patient has complete blindness on the right side and partial blindness on the left which has been attributed to previous strokes/ophthalmic hemorrhages.  Patient denies any focal weakness or sensory deficits in her extremities or face.    Past Medical History:  Diagnosis Date  . Aortic stenosis   . Coronary artery disease    Cath 1/13 left main  . 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 Dyspnea   . Hyperlipidemia   . Hypertension   . Hypertensive heart disease without CHF   . Hypothyroidism   . Lumbar disc disease   . Lung nodules    Sees [facility], thought to be benign  . Obesity   . Spinal stenosis   . Villous adenoma of rectum   :  Past Surgical History:  Procedure Laterality Date  . AORTIC VALVE REPLACEMENT  12/28/2011   Procedure: AORTIC VALVE REPLACEMENT (AVR);  Surgeon: Grace Isaac, MD;  Location: [facility];  Service: Open Heart Surgery;  Laterality: N/A;  . CARDIAC CATHETERIZATION  2013  . CHOLECYSTECTOMY    . CORONARY ARTERY BYPASS GRAFT  12/28/2011   Procedure: CORONARY ARTERY BYPASS GRAFTING (CABG);  Surgeon: Grace Isaac, MD;  Location: [facility];  Service: Open Heart Surgery;  Laterality: N/A;  . HERNIA REPAIR    . LEFT AND RIGHT HEART CATHETERIZATION WITH CORONARY ANGIOGRAM N/A 12/26/2011   Procedure: LEFT AND RIGHT HEART CATHETERIZATION WITH  CORONARY ANGIOGRAM;  Surgeon: Jacolyn Reedy, MD;  Location: [facility] CATH LAB;  Service: Cardiovascular;  Laterality: N/A;  . LUMBAR LAMINECTOMY    . PARTIAL COLECTOMY  2008   Villous adenoma  :   Current Facility-Administered Medications:  .  acidophilus (RISAQUAD) capsule 1 capsule, 1 capsule, Oral, Daily, Lady Deutscher, MD, 1 capsule at 05/15/18  1033 .  ALPRAZolam Duanne Moron) tablet 0.25 mg, 0.25 mg, Oral, QHS, Lady Deutscher, MD, 0.25 mg at 05/14/18 2105 .  atorvastatin (LIPITOR) tablet 20 mg, 20 mg, Oral, QHS, Lady Deutscher, MD, 20 mg at 05/14/18 2105 .  calcium-vitamin D (OSCAL WITH D) 500-200 MG-UNIT per tablet 1 tablet, 1 tablet, Oral, Q breakfast, Lady Deutscher, MD, 1 tablet at 05/15/18 [phone] .  docusate sodium (COLACE) capsule 100 mg, 100 mg, Oral, QHS, Lady Deutscher, MD, 100 mg at 05/14/18 2110 .  gabapentin (NEURONTIN) capsule 200 mg, 200 mg, Oral, TID, Lady Deutscher, MD, 200 mg at 05/15/18 1034 .  levothyroxine (SYNTHROID, LEVOTHROID) tablet 88 mcg, 88 mcg, Oral, QAC breakfast, Lady Deutscher, MD, 88 mcg at 05/15/18 0815 .  losartan (COZAAR) tablet 100 mg, 100 mg, Oral, Daily, Lady Deutscher, MD, 100 mg at 05/15/18 1034 .  metoprolol tartrate (LOPRESSOR) tablet 25 mg, 25 mg, Oral, BID, Lady Deutscher, MD, 25 mg at 05/15/18 1034 .  multivitamin with minerals tablet 1 tablet, 1 tablet, Oral, Daily, Lady Deutscher, MD, 1 tablet at 05/15/18 1033 .  ondansetron (ZOFRAN) tablet 4 mg, 4 mg, Oral, Q6H PRN **OR** ondansetron (ZOFRAN) injection 4 mg, 4 mg, Intravenous, Q6H PRN, Lady Deutscher, MD .  oxybutynin (DITROPAN) tablet 5 mg, 5 mg, Oral, Beverly Gust, Lady Deutscher, MD, 5 mg at 05/14/18 1115 .  polyethylene glycol (MIRALAX / GLYCOLAX) packet 17 g, 17 g, Oral, Daily PRN, Lady Deutscher, MD .  predniSONE (DELTASONE) tablet 60 mg, 60 mg, Oral, Q breakfast, Shavonn Convey G, MD .  traMADol Veatrice Bourbon) tablet 50 mg, 50 mg, Oral, Q6H PRN, Lady Deutscher, MD .  warfarin (COUMADIN) tablet 8 mg, 8 mg, Oral, ONCE-1800, Hall, Carole N, DO .  Warfarin - Pharmacist Dosing Inpatient, , Does not apply, q1800, Rumbarger, Rachel L, RPH:  . acidophilus  1 capsule Oral Daily  . ALPRAZolam  0.25 mg Oral QHS  . atorvastatin  20 mg Oral QHS  . calcium-vitamin D  1 tablet Oral Q breakfast  . docusate sodium   100 mg Oral QHS  . gabapentin  200 mg Oral TID  . levothyroxine  88 mcg Oral QAC breakfast  . losartan  100 mg Oral Daily  . metoprolol tartrate  25 mg Oral BID  . multivitamin with minerals  1 tablet Oral Daily  . oxybutynin  5 mg Oral QODAY  . predniSONE  60 mg Oral Q breakfast  . warfarin  8 mg Oral ONCE-1800  . Warfarin - Pharmacist Dosing Inpatient   Does not apply q1800  :  Allergies  Allergen Reactions  . Ibuprofen Other (See Comments)    Hx of GI bleed  . Nucynta [Tapentadol] Other (See Comments)    Exact reaction not recalled, but knows she "cannot take"  . Ace Inhibitors Cough  . Doxycycline Nausea Only  . Hydrocodone Nausea Only  . Nsaids     Can only tolerate Tylenol (has a history of GI BLEEDS)  . Adhesive [Tape] Rash    Blisters (can use only paper tape)  . Latex Rash  and Other (See Comments)    Paper tape only  :   Family History  Problem Relation Age of Onset  . Lymphoma Sister   . Cancer Sister   . Breast cancer Sister   . Cancer Mother   . Heart attack Father   . Heart disease Father    Social History   Socioeconomic History  . Marital status: Married    Spouse name: Not on file  . Number of children: 4  . Years of education: 19  . Highest education level: Not on file  Occupational History  . Occupation: retired Curator  . Financial resource strain: Not on file  . Food insecurity:    Worry: Not on file    Inability: Not on file  . Transportation needs:    Medical: Not on file    Non-medical: Not on file  Tobacco Use  . Smoking status: Former Smoker    Packs/day: 0.50    Years: 10.00    Pack years: 5.00    Types: Cigarettes    Last attempt to quit: 12/24/1978    Years since quitting: 39.4  . Smokeless tobacco: Never Used  Substance and Sexual Activity  . Alcohol use: No  . Drug use: No  . Sexual activity: Yes  Lifestyle  . Physical activity:    Days per week: Not on file    Minutes per session: Not on file  . Stress:  Not on file  Relationships  . Social connections:    Talks on phone: Not on file    Gets together: Not on file    Attends religious service: Not on file    Active member of club or organization: Not on file    Attends meetings of clubs or organizations: Not on file    Relationship status: Not on file  . Intimate partner violence:    Fear of current or ex partner: Not on file    Emotionally abused: Not on file    Physically abused: Not on file    Forced sexual activity: Not on file  Other Topics Concern  . Not on file  Social History Narrative   Lives at [place]   Phone [phone]   [phone]   Caffeine use: Drinks coffee- 2 cups per day at the most per pt   Review of Systems: Per HPI.  All other systems are negative.  Physical Exam:  Blood pressure (!) 153/66, pulse 68, temperature 98.2 F (36.8 C), temperature source Oral, resp. rate 18, height 5\' 3"  (1.6 m), weight 138 lb 6.4 oz (62.8 kg), SpO2 97 %.  Alert, awake, oriented x3.  Very pleasant elderly frail female. HEENT: Scleral icterus, no conjunctival drainage or pallor. Lungs: Clear to auscultation bilaterally without expiratory wheezing. Cardiac: Irregularly irregular with mechanical aortic sound Abdomen: Soft, nontender, nondistended. Lymph nodes: No palpable lymphadenopathy in the neck, armpits, or groin Neurologic: The visual deficit, no gross focal neurological deficiencies Skin: Scattered ecchymosis without petechiae or cutaneous hematomas Musculoskeletal: No swelling in lower extremities  LABS:  Recent Labs    05/14/18 0839 05/15/18 0546  WBC 10.7* 8.8  HGB 13.1 11.7*  HCT 38.4 34.3*  PLT 199 166    Recent Labs    05/14/18 0839 05/15/18 0546  NA 132* 133*  K 4.5 4.3  CL 99* 101  CO2 25 22  GLUCOSE 124* 95  BUN 12 14  CREATININE 0.81 0.74  CALCIUM 9.6 9.2  RADIOLOGY:  Ct Abdomen Pelvis W Contrast  Result Date: 05/14/2018 CLINICAL DATA:  Elevated bilirubin  EXAM: CT ABDOMEN AND PELVIS WITH CONTRAST TECHNIQUE: Multidetector CT imaging of the abdomen and pelvis was performed using the standard protocol following bolus administration of intravenous contrast. CONTRAST:  142mL OMNIPAQUE IOHEXOL 300 MG/ML  SOLN COMPARISON:  PET-CT 11/20/2011, CT abdomen pelvis 01/23/2008 FINDINGS: Lower chest: Lung bases demonstrate no acute consolidation or pleural effusion. There is cardiomegaly. Small distal esophageal hernia. Hepatobiliary: No focal hepatic abnormality. Status post cholecystectomy. No significant intra or extrahepatic biliary enlargement. Pancreas: Unremarkable. No pancreatic ductal dilatation or surrounding inflammatory changes. Spleen: Normal in size without focal abnormality. Adrenals/Urinary Tract: Adrenal glands are within normal limits. Cysts within the bilateral kidneys including right parapelvic cysts. Prominent extrarenal pelvis left greater than right, without ureteral stone. The bladder is Stomach/Bowel: Stomach is nonenlarged. No dilated small bowel. No colon wall thickening. Postsurgical changes at the cecum and rectosigmoid colon. Vascular/Lymphatic: Severe aortic atherosclerosis without aneurysmal dilatation. No significantly enlarged lymph nodes. Reproductive: Uterus and bilateral adnexa are unremarkable. Other: Negative for free air or free fluid. Interval right lower quadrant ventral hernia repair. Musculoskeletal: Scoliosis and degenerative changes of the spine. Laminectomy changes at L4-L5. IMPRESSION: 1. Status post cholecystectomy. No significant intra or extrahepatic biliary enlargement. 2. There are no acute abnormalities visualize within the abdomen or pelvis 3. Bilateral renal cysts Electronically Signed   By: Donavan Foil M.D.   On: 05/14/2018 01:42    Assessment and Plan:  84 y.o. female with previous history of iron deficiency anemia for which she was seen at [facility] by Dr. Alen Blew in 2012 now presenting with rapidly  progressive severe anemia with microcytic and normochromic character with elevated RDW in the setting of elevated LDH, hyperbilirubinemia, and undetectable haptoglobin consistent with acute intravascular hemolysis.  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  Exact etiology or triggering mechanism is unclear at this time.  Recommendations: - Patient should remain in the hospital and start disease-directed therapy with steroids with prednisone 1 mg/kg daily tonight until an improvement in the hematological profile is observed - Additional lab work will be obtained to evaluating for possible presence of autoimmune conditions such as lupus, antiphospholipid antibody syndrome, malignant process such as a lymphoproliferative process, or presence of viral infection such as hepatitis C. - Discontinue ceftriaxone as ceftriaxone has 17% association of hemolytic anemia although I do not think it is directly responsible for the current presentation as the symptoms the patient has experienced have started prior to initiation of ceftriaxone. - Pathology we will continue following patient daily and will adjust our recommendations as new information becomes available.    Ardath Sax, MD 05/15/2018, 3:21 PM

## 2018-05-15 NOTE — Care Management Note (Addendum)
Case Management Note  Patient Details  Name: Carol [name] MRN: [id number] Date of Birth: 01-Oct-1928  Subjective/Objective:                    Action/Plan:  Spoke to patient and her grand daughter at bedside. Patient is from independent living at  [place] with Vaughan Basta at [facility] requested DC summary and home health orders be faxed to her at [phone] and she will arrange HHPT. Patient has rollator Expected Discharge Date:  05/15/18               Expected Discharge Plan:  [place]  In-House Referral:     Discharge planning Services  CM Consult  Post Acute Care Choice:  Home Health Choice offered to:  Patient  DME Arranged:  N/A DME Agency:  NA  HH Arranged:  PT HH Agency:     Status of Service:  Completed, signed off  If discussed at [place] of Stay Meetings, dates discussed:    Additional Comments:  Marilu Favre, RN 05/15/2018, 1:27 PM

## 2018-05-15 NOTE — Progress Notes (Signed)
[facility] for Warfarin Indication: AVR/Afib  Allergies  Allergen Reactions  . Ibuprofen Other (See Comments)    Hx of GI bleed  . Nucynta [Tapentadol] Other (See Comments)    Exact reaction not recalled, but knows she "cannot take"  . Ace Inhibitors Cough  . Doxycycline Nausea Only  . Hydrocodone Nausea Only  . Nsaids     Can only tolerate Tylenol (has a history of GI BLEEDS)  . Adhesive [Tape] Rash    Blisters (can use only paper tape)  . Latex Rash and Other (See Comments)    Paper tape only    Patient Measurements: Height: 5\' 3"  (160 cm) Weight: 145 lb (65.8 kg) IBW/kg (Calculated) : 52.4  Vital Signs: Temp: 98 F (36.7 C) (05/23 0457) Temp Source: Oral (05/23 0457) BP: 162/65 (05/23 0457) Pulse Rate: 68 (05/23 0457)  Labs: Recent Labs    05/13/18 2132 05/13/18 2255 05/14/18 0839 05/15/18 0546  HGB 7.9*  --  13.1 11.7*  HCT 22.7*  --  38.4 34.3*  PLT 264  --  199 166  LABPROT  --  28.2* 21.5* 24.5*  INR  --  2.66 1.89 2.23  CREATININE 0.92  --  0.81 0.74    Estimated Creatinine Clearance: 44.4 mL/min (by C-G formula based on SCr of 0.74 mg/dL).  Assessment: 84yo female c/o weakness, presented to PCP where labs showed UTI, low Hgb, hyponatremia, and high bili, admitted for blood-loss anemia to continues warfarin for Afib/AVR  INR = 2.23 this AM   Goal of Therapy:  INR = 2 to 3 Monitor platelets by anticoagulation protocol: Yes   Plan:  Warfarin 8 mg po x 1 tonight Daily INR  Thank you Anette Guarneri, PharmD [phone] 05/15/2018 10:24 AM

## 2018-05-16 ENCOUNTER — Other Ambulatory Visit: Payer: Self-pay

## 2018-05-16 ENCOUNTER — Encounter (HOSPITAL_COMMUNITY): Payer: Self-pay | Admitting: General Practice

## 2018-05-16 DIAGNOSIS — D62 Acute posthemorrhagic anemia: Secondary | ICD-10-CM

## 2018-05-16 HISTORY — DX: Acute posthemorrhagic anemia: D62

## 2018-05-16 LAB — CBC WITH DIFFERENTIAL/PLATELET
Basophils Absolute: 0 10*3/uL (ref 0.0–0.1)
Basophils Relative: 0 %
Eosinophils Absolute: 0 10*3/uL (ref 0.0–0.7)
Eosinophils Relative: 0 %
HCT: 30.7 % — ABNORMAL LOW (ref 36.0–46.0)
Hemoglobin: 10.5 g/dL — ABNORMAL LOW (ref 12.0–15.0)
Lymphocytes Relative: 14 %
Lymphs Abs: 1 10*3/uL (ref 0.7–4.0)
MCH: 32.7 pg (ref 26.0–34.0)
MCHC: 34.2 g/dL (ref 30.0–36.0)
MCV: 95.6 fL (ref 78.0–100.0)
Monocytes Absolute: 0.4 10*3/uL (ref 0.1–1.0)
Monocytes Relative: 6 %
Neutro Abs: 5.6 10*3/uL (ref 1.7–7.7)
Neutrophils Relative %: 80 %
Platelets: 173 10*3/uL (ref 150–400)
RBC: 3.21 MIL/uL — ABNORMAL LOW (ref 3.87–5.11)
RDW: 21.8 % — ABNORMAL HIGH (ref 11.5–15.5)
WBC: 7 10*3/uL (ref 4.0–10.5)

## 2018-05-16 LAB — COMPREHENSIVE METABOLIC PANEL
ALT: 17 U/L (ref 14–54)
AST: 34 U/L (ref 15–41)
Albumin: 3.8 g/dL (ref 3.5–5.0)
Alkaline Phosphatase: 48 U/L (ref 38–126)
Anion gap: 10 (ref 5–15)
BUN: 23 mg/dL — ABNORMAL HIGH (ref 6–20)
CO2: 23 mmol/L (ref 22–32)
Calcium: 8.9 mg/dL (ref 8.9–10.3)
Chloride: 96 mmol/L — ABNORMAL LOW (ref 101–111)
Creatinine, Ser: 0.91 mg/dL (ref 0.44–1.00)
GFR calc Af Amer: >60 mL/min (ref >60)
GFR calc non Af Amer: 55 mL/min — ABNORMAL LOW (ref >60)
Glucose, Bld: 138 mg/dL — ABNORMAL HIGH (ref 65–99)
Potassium: 4.6 mmol/L (ref 3.5–5.1)
Sodium: 129 mmol/L — ABNORMAL LOW (ref 135–145)
Total Bilirubin: 4.2 mg/dL — ABNORMAL HIGH (ref 0.3–1.2)
Total Protein: 5.7 g/dL — ABNORMAL LOW (ref 6.5–8.1)

## 2018-05-16 LAB — LACTATE DEHYDROGENASE: LDH: 352 U/L — ABNORMAL HIGH (ref 98–192)

## 2018-05-16 LAB — BILIRUBIN, DIRECT: Bilirubin, Direct: 0.4 mg/dL (ref 0.1–0.5)

## 2018-05-16 LAB — HAPTOGLOBIN: Haptoglobin: <10 mg/dL — ABNORMAL LOW (ref 34–200)

## 2018-05-16 LAB — HIV ANTIBODY (ROUTINE TESTING W REFLEX): HIV Screen 4th Generation wRfx: NONREACTIVE

## 2018-05-16 LAB — PROTIME-INR
INR: 2.27
Prothrombin Time: 24.8 seconds — ABNORMAL HIGH (ref 11.4–15.2)

## 2018-05-16 LAB — GLUCOSE, CAPILLARY: Glucose-Capillary: 136 mg/dL — ABNORMAL HIGH (ref 65–99)

## 2018-05-16 MED ORDER — WARFARIN SODIUM 6 MG PO TABS
6.0000 mg | ORAL_TABLET | Freq: Once | ORAL | Status: AC
  Administered 2018-05-16: 6 mg via ORAL
  Filled 2018-05-16: qty 1

## 2018-05-16 MED ORDER — METHYLPREDNISOLONE SODIUM SUCC 125 MG IJ SOLR
125.0000 mg | Freq: Every day | INTRAMUSCULAR | Status: DC
  Administered 2018-05-16 – 2018-05-18 (×3): 125 mg via INTRAVENOUS
  Filled 2018-05-16 (×3): qty 2

## 2018-05-16 MED ORDER — AMLODIPINE BESYLATE 5 MG PO TABS
5.0000 mg | ORAL_TABLET | Freq: Every day | ORAL | Status: DC
  Administered 2018-05-16 – 2018-05-18 (×3): 5 mg via ORAL
  Filled 2018-05-16 (×3): qty 1

## 2018-05-16 NOTE — Consult Note (Signed)
IP PROGRESS NOTE  Subjective:  Patient complains of developing constipation, but otherwise denies any new symptoms.  Objective: Vital signs in last 24 hours: Blood pressure (!) 166/80, pulse 76, temperature (!) 97.4 F (36.3 C), resp. rate 18, height 5\' 3"  (1.6 m), weight 138 lb 6.4 oz (62.8 kg), SpO2 99 %.  Intake/Output from previous day: 05/23 0701 - 05/24 0700 In: 900 [P.O.:900] Out: 1350 [Urine:1350]  Physical Exam: Alert, awake, oriented x3.  Very pleasant elderly frail female. HEENT: Scleral icterus, no conjunctival drainage or pallor. Lungs: Clear to auscultation bilaterally without expiratory wheezing. Cardiac: Irregularly irregular with mechanical aortic sound Abdomen: Soft, nontender, nondistended. Lymph nodes: No palpable lymphadenopathy in the neck, armpits, or groin Neurologic: The visual deficit, no gross focal neurological deficiencies Skin: Scattered ecchymosis without petechiae or cutaneous hematomas Musculoskeletal: No swelling in lower extremities  Lab Results: Recent Labs    05/15/18 0546 05/16/18 0516  WBC 8.8 7.0  HGB 11.7* 10.5*  HCT 34.3* 30.7*  PLT 166 173    BMET Recent Labs    05/15/18 0546 05/16/18 0516  NA 133* 129*  K 4.3 4.6  CL 101 96*  CO2 22 23  GLUCOSE 95 138*  BUN 14 23*  CREATININE 0.74 0.91  CALCIUM 9.2 8.9    No results found for: CEA1  Studies/Results: No results found.  Medications: I have reviewed the patient's current medications.  Assessment/Plan: 84 y.o. female with previous history of iron deficiency anemia for which she was seen at [facility] by Dr. Alen Blew in 2012 now presenting with rapidly progressive severe anemia with microcytic and normochromic character with elevated RDW in the setting of elevated LDH, hyperbilirubinemia, and undetectable haptoglobin consistent with acute intravascular hemolysis.  Testing on admission was positive for presence of warm autoantibodies consistent with warm  antibody autoimmune hemolytic anemia.  Exact etiology or triggering mechanism is unclear at this time.  Today, hemoglobin continues to drop to 10.5, LDH remains elevated, but improving at 352.  Has received a single dose of prednisone last night at 60 mg.  Recommendations: - We will change patient from prednisone to methylprednisolone 125 mg IV daily in attempt to accelerate therapy response. -Consider initiation of pantoprazole for stress ulcer prophylaxis. -Consider initiation of Bactrim DS for PCP prophylaxis at 1 tablet per day. -Patient needs to stay and hospitalized until significant improvement in her hemolysis is observed as measured by stabilization of hemoglobin and normalization of LDH.  At that time, patient can be transitioned back to prednisone 60 mg daily and discharged for outpatient follow-up with Dr. Alen Blew. -Over the weekend, Dr. Benay Spice will follow up on the patient and will be available to answer any questions if they arise.    LOS: 2 days   Ardath Sax, MD   05/16/2018, 10:35 AM

## 2018-05-16 NOTE — Progress Notes (Signed)
[facility] for Warfarin Indication: tissue AVR/Afib  Allergies  Allergen Reactions  . Ibuprofen Other (See Comments)    Hx of GI bleed  . Nucynta [Tapentadol] Other (See Comments)    Exact reaction not recalled, but knows she "cannot take"  . Ace Inhibitors Cough  . Doxycycline Nausea Only  . Hydrocodone Nausea Only  . Nsaids     Can only tolerate Tylenol (has a history of GI BLEEDS)  . Adhesive [Tape] Rash    Blisters (can use only paper tape)  . Latex Rash and Other (See Comments)    Paper tape only    Patient Measurements: Height: 5\' 3"  (160 cm) Weight: 138 lb 6.4 oz (62.8 kg) IBW/kg (Calculated) : 52.4  Vital Signs: Temp: 97.4 F (36.3 C) (05/24 0538) BP: 166/80 (05/24 0538) Pulse Rate: 76 (05/24 0538)  Labs: Recent Labs    05/14/18 0839 05/15/18 0546 05/16/18 0516  HGB 13.1 11.7* 10.5*  HCT 38.4 34.3* 30.7*  PLT 199 166 173  LABPROT 21.5* 24.5* 24.8*  INR 1.89 2.23 2.27  CREATININE 0.81 0.74 0.91    Estimated Creatinine Clearance: 35.3 mL/min (by C-G formula based on SCr of 0.91 mg/dL).  Assessment: 84 yo female c/o weakness, presented to PCP where labs showed UTI, low Hgb, hyponatremia, and high bili, admitted for blood-loss anemia to continue warfarin for Afib/tissue AVR.  INR is therapeutic at 2.27 today. No bleeding noted, Hgb 10.5 s/p transfusion, platelets are normal.  PTA regimen: 6 mg daily on Sun/Mon/Wed/Fri/Sat, 7 mg Tue, 8 mg Thu  Goal of Therapy:  INR 2-3 Monitor platelets by anticoagulation protocol: Yes   Plan:  Warfarin 6 mg PO tonight Daily INR Monitor for s/sx of bleeding   Renold Genta, PharmD, BCPS Clinical Pharmacist Clinical phone for 05/16/2018 until 3p is [phone] After 3p, please call Main Rx at [phone] for assistance 05/16/2018 10:36 AM

## 2018-05-16 NOTE — Plan of Care (Signed)

## 2018-05-16 NOTE — Progress Notes (Signed)
Progress Note  Carol [name] JWJ:[id number] DOB: [birth year]  PCP: Darcus Austin, MD  Admit date: 05/13/2018  05/16/18: Carol [name] with hematologist Dr Lebron Conners who will see the patient for suspected hemolytic anemia. Hemolytic anemia confirmed. Hematology will start IV steroids and workup initiated.  Recommendations for Outpatient Follow-up:   1.   Follow up with hematology  2.  Follow-up with GI.  Called [facility] office and spoke with Carol [name] who will call you for a scheduled appointment. 3.   Follow-up with PCP 4.  Take your medications as prescribed  Discharge Diagnoses:  Active Hospital Problems   Diagnosis Date Noted  . Acute blood loss anemia 05/14/2018  . AIHA (autoimmune hemolytic anemia) (HCC)   . Elevated bilirubin 05/14/2018  . Obstructive jaundice 05/14/2018  . Long term (current) use of anticoagulants 07/14/2013  . Hyponatremia 01/17/2012  . Atrial fibrillation ([place])   . 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 Hypertensive heart disease without CHF   . Hyperlipidemia   . Hypothyroidism     Resolved Hospital Problems  No resolved problems to display.    Diet recommendation: Resume previous diet  Vitals:   05/15/18 2046 05/16/18 0538  BP: (!) 150/76 (!) 166/80  Pulse: 76 76  Resp:    Temp: (!) 97.4 F (36.3 C) (!) 97.4 F (36.3 C)  SpO2: 97% 99%    History of present illness:  Carol Hilbun Lucenteis a 84 y.o.femalewith medical history significant ofatrial fibrillation, HTN, CAD, HLD, thyroid disease,anticoagulation(Coumadin)for mechanical aortic valve, presents with 3-4 days of progressive, generalized weakness and lightheadedness. No syncope. No SOB, pain of any kind, vomiting, diarrhea. She describes some burning with urination but no fever or flank pain. She has intermittent mild nausea. She went to see her doctor today and was called this evening at home with report of abnormal labs, specifically (per daughter) hemoglobin of 9.5, sodium of 125,  elevated bilirubin of 4.0. She was advised to come to the hospital for further evaluation and management.Patient reports no family history of pancreatic disease that she knows of. She personally has a history of aortic valve replacement. She has had a 2 pound weight loss per her primary care physician. Is being admitted into the hospital for further evaluation of painless jaundice with elevated bilirubin, hemoglobin of 7.9, and a sodium of 125.  The history is provided bythe patient and a relative.No language interpreter was used.  05/14/2018: Patient seen and examined at her bedside.  She has no new complaints.  She endorses mild abdominal discomfort with palpation. Urine analysis positive for pyuria.  Urine culture pending.  We will start the patient on IV ceftriaxone for UTI.  Patient was transfused 2 units of PRBCs this morning and her hemoglobin improved from 7.9 to 13.  FOBT negative.  CT abdomen and pelvis with contrast negative for any acute findings.  No significant intra-or extrahepatic biliary enlargement.  Total bilirubin elevated at 4.9 on 05/13/2018.  Repeat level.   05/15/2018: Patient seen and examined at bedside.  She denies any abdominal pain or nausea.  Repeated total bilirubin was persistently elevated but trending down.  AST, ALT, alkaline phosphatase normal.  The patient is asymptomatic CT abdomen pelvis with contrast revealed no acute findings. Elevated LDH and low haptoglobin. Hematology consulted due to suspected hemolytic anemia. Highly appreciated.  05/16/2018: Patient seen and examined bedside.  She appears jaundiced.  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  Highly appreciated.   Unclear etiology  Transfused 2U prbcs 05/14/18. Symptoms improving Elevated LDH and low haptoglobin Suspect hemolytic anemia Dr Lebron Conners consulted. Hg 10 from 11.7 from 13 Hg on admission 7.9.  Baseline Hg 12-13 FOBT negative on 05/13/18  Warm antibody autoimmune hemolytic anemia Management as recommended by hematology Continue IV Solu-Medrol 125 mg daily Stop ceftriaxone/Keflex Obtain daily CBC and LDH/haptoglobin Obtain daily CMP  Hypertension Add amlodipine 5 Milgram daily  Generalized weakness Most likely secondary to above  Euvolemic hyponatremia, persistent  Elevated total bilirubin/injury bilirubin Consistent with hemolytic anemia  Chronic A. fib on Coumadin Rate controlled INR is therapeutic  UTI, poa Completed Rocephin Stop Rocephin as recommended by hematology  Procedures:  None  Consultations:  Hematology/oncology  Discharge Exam: BP (!) 166/80 (BP Location: Left Arm) Comment: X.Blount,NP made aware via text page.  Pulse 76   Temp (!) 97.4 F (36.3 C)   Resp 18   Ht 5\' 3"  (1.6 m)   Wt 62.8 kg (138 lb 6.4 oz)   SpO2 99%   BMI 24.52 kg/m  . General: 84 y.o. year-old female well-developed well-nourished in no acute distress.  Alert and oriented x3.   . Cardiovascular: Regular rate and rhythm with no gallops.  No thyromegaly or JVD noted.   Marland Kitchen Respiratory: Clear to auscultation with no wheezes or rales. Good inspiratory effort. . Abdomen: Soft nontender nondistended with normal bowel sounds x4 quadrants. . Musculoskeletal: No lower extremity edema. 2/4 pulses in all 4 extremities. . Skin: Skin is jaundiced. Marland Kitchen Psychiatry: Mood is appropriate for condition and setting  Discharge Instructions You were cared for by a hospitalist during your hospital stay.  If you have any questions about your discharge medications or the care you received while you were in the hospital after you are discharged, you can call the unit and asked to speak with the hospitalist on call if the hospitalist that took care of you is not available. Once you are discharged, your primary care physician will handle any further medical issues. Please note that NO REFILLS for any discharge medications will be authorized once you are discharged, as it is imperative that you return to your primary care physician (or establish a relationship with a primary care physician if you do not have one) for your aftercare needs so that they can reassess your need for medications and monitor your lab values.   Allergies as of 05/16/2018      Reactions   Ibuprofen Other (See Comments)   Hx of GI bleed   Nucynta [tapentadol] Other (See Comments)   Exact reaction not recalled, but knows she "cannot take"   Ace Inhibitors Cough   Doxycycline Nausea Only   Hydrocodone Nausea Only   Nsaids    Can only tolerate Tylenol (has a history of GI BLEEDS)   Adhesive [tape] Rash   Blisters (can use only paper tape)   Latex Rash, Other (See Comments)   Paper tape only      Medication List    STOP taking these medications   acetaminophen 500 MG tablet Commonly known as:  TYLENOL  losartan 100 MG tablet Commonly known as:  COZAAR     TAKE these medications   ALPRAZolam 0.25 MG tablet Commonly known as:  XANAX Take 0.25 mg by mouth at bedtime.   atorvastatin 20 MG tablet Commonly known as:  LIPITOR Take 20 mg by mouth at bedtime.   BESIVANCE 0.6 % Susp Generic drug:  Besifloxacin HCl Place 1 drop into the left eye See admin instructions. Place 1 drop left eye 4 times daily for 2 days after Avastin eye injections   CALCIUM 600 + D PO Take 1 tablet by mouth every morning.   cephALEXin 250 MG capsule Commonly known as:  KEFLEX Take 1 capsule (250 mg total) by mouth 2 (two) times daily for  5 days.   docusate sodium 100 MG capsule Commonly known as:  COLACE Take 100 mg by mouth at bedtime.   gabapentin 100 MG capsule Commonly known as:  NEURONTIN TAKE 2 CAPSULES BY MOUTH 3 TIMES A DAY   levothyroxine 88 MCG tablet Commonly known as:  SYNTHROID, LEVOTHROID TAKE 1 TABLET BY MOUTH EVERY MORNING ON AN EMPTY STOMACH   metoprolol tartrate 25 MG tablet Commonly known as:  LOPRESSOR Take 25 mg by mouth 2 (two) times daily.   multivitamin with minerals Tabs tablet Take 1 tablet by mouth daily.   oxybutynin 5 MG tablet Commonly known as:  DITROPAN Take 5 mg by mouth every other day.   polyethylene glycol packet Commonly known as:  MIRALAX / GLYCOLAX Take 17 g by mouth daily as needed for mild constipation.   PROBIOTIC PO Take 1 capsule by mouth daily.   warfarin 5 MG tablet Commonly known as:  COUMADIN Take as directed. If you are unsure how to take this medication, talk to your nurse or doctor. Original instructions:  Take 5 mg by mouth at bedtime.   warfarin 1 MG tablet Commonly known as:  COUMADIN Take as directed. If you are unsure how to take this medication, talk to your nurse or doctor. Original instructions:  Take 1-3 mg by mouth See admin instructions. Take 1 mg by mouth at bedtime on Sun/Mon/Wed/Fri/Sat and 2 mg on Tues and 3 mg on Thurs, in conjunction with one 5 mg tablet      Allergies  Allergen Reactions  . Ibuprofen Other (See Comments)    Hx of GI bleed  . Nucynta [Tapentadol] Other (See Comments)    Exact reaction not recalled, but knows she "cannot take"  . Ace Inhibitors Cough  . Doxycycline Nausea Only  . Hydrocodone Nausea Only  . Nsaids     Can only tolerate Tylenol (has a history of GI BLEEDS)  . Adhesive [Tape] Rash    Blisters (can use only paper tape)  . Latex Rash and Other (See Comments)    Paper tape only      The results of significant diagnostics from this hospitalization (including imaging, microbiology, ancillary and  laboratory) are listed below for reference.    Significant Diagnostic Studies: Ct Abdomen Pelvis W Contrast  Result Date: 05/14/2018 CLINICAL DATA:  Elevated bilirubin EXAM: CT ABDOMEN AND PELVIS WITH CONTRAST TECHNIQUE: Multidetector CT imaging of the abdomen and pelvis was performed using the standard protocol following bolus administration of intravenous contrast. CONTRAST:  130mL OMNIPAQUE IOHEXOL 300 MG/ML  SOLN COMPARISON:  PET-CT 11/20/2011, CT abdomen pelvis 01/23/2008 FINDINGS: Lower chest: Lung bases demonstrate no acute consolidation or pleural effusion. There is cardiomegaly. Small distal esophageal hernia. Hepatobiliary: No focal hepatic abnormality. Status post cholecystectomy.  No significant intra or extrahepatic biliary enlargement. Pancreas: Unremarkable. No pancreatic ductal dilatation or surrounding inflammatory changes. Spleen: Normal in size without focal abnormality. Adrenals/Urinary Tract: Adrenal glands are within normal limits. Cysts within the bilateral kidneys including right parapelvic cysts. Prominent extrarenal pelvis left greater than right, without ureteral stone. The bladder is Stomach/Bowel: Stomach is nonenlarged. No dilated small bowel. No colon wall thickening. Postsurgical changes at the cecum and rectosigmoid colon. Vascular/Lymphatic: Severe aortic atherosclerosis without aneurysmal dilatation. No significantly enlarged lymph nodes. Reproductive: Uterus and bilateral adnexa are unremarkable. Other: Negative for free air or free fluid. Interval right lower quadrant ventral hernia repair. Musculoskeletal: Scoliosis and degenerative changes of the spine. Laminectomy changes at L4-L5. IMPRESSION: 1. Status post cholecystectomy. No significant intra or extrahepatic biliary enlargement. 2. There are no acute abnormalities visualize within the abdomen or pelvis 3. Bilateral renal cysts Electronically Signed   By: Donavan Foil M.D.   On: 05/14/2018 01:42     Microbiology: Recent Results (from the past 240 hour(s))  Urine culture     Status: None   Collection Time: 05/13/18 11:00 PM  Result Value Ref Range Status   Specimen Description URINE, RANDOM  Final   Special Requests NONE  Final   Culture   Final    NO GROWTH Performed at [facility], [address] [place], [place] [address]    Report Status 05/15/2018 FINAL  Final     Labs: Basic Metabolic Panel: Recent Labs  Lab 05/13/18 2132 05/14/18 0839 05/15/18 0546 05/16/18 0516  NA 125* 132* 133* 129*  K 3.9 4.5 4.3 4.6  CL 92* 99* 101 96*  CO2 23 25 22 23   GLUCOSE 104* 124* 95 138*  BUN 18 12 14  23*  CREATININE 0.92 0.81 0.74 0.91  CALCIUM 9.0 9.6 9.2 8.9   Liver Function Tests: Recent Labs  Lab 05/13/18 2132 05/14/18 0837 05/15/18 0546 05/16/18 0516  AST 33 37 40 34  ALT 15 16 17 17   ALKPHOS 47 54 48 48  BILITOT 4.3* 7.0* 5.7* 4.2*  PROT 6.0* 6.9 5.9* 5.7*  ALBUMIN 4.0 4.5 4.2 3.8   Recent Labs  Lab 05/14/18 0839  LIPASE 47   No results for input(s): AMMONIA in the last 168 hours. CBC: Recent Labs  Lab 05/13/18 2132 05/14/18 0839 05/15/18 0546 05/16/18 0516  WBC 8.0 10.7* 8.8 7.0  NEUTROABS  --   --   --  5.6  HGB 7.9* 13.1 11.7* 10.5*  HCT 22.7* 38.4 34.3* 30.7*  MCV 100.9* 93.2 97.2 95.6  PLT 264 199 166 173   Cardiac Enzymes: No results for input(s): CKTOTAL, CKMB, CKMBINDEX, TROPONINI in the last 168 hours. BNP: BNP (last 3 results) No results for input(s): BNP in the last 8760 hours.  ProBNP (last 3 results) No results for input(s): PROBNP in the last 8760 hours.  CBG: Recent Labs  Lab 05/14/18 0837 05/15/18 0740 05/16/18 0747  GLUCAP 123* 97 136*       Signed:  Kayleen Memos, MD [facility] 05/16/2018, 11:34 AM

## 2018-05-17 DIAGNOSIS — Z952 Presence of prosthetic heart valve: Secondary | ICD-10-CM

## 2018-05-17 DIAGNOSIS — Z79899 Other long term (current) drug therapy: Secondary | ICD-10-CM

## 2018-05-17 LAB — RHEUMATOID FACTOR: Rhuematoid fact SerPl-aCnc: <10 IU/mL (ref 0.0–13.9)

## 2018-05-17 LAB — CBC WITH DIFFERENTIAL/PLATELET
Basophils Absolute: 0 10*3/uL (ref 0.0–0.1)
Basophils Relative: 0 %
Eosinophils Absolute: 0 10*3/uL (ref 0.0–0.7)
Eosinophils Relative: 0 %
HCT: 29.9 % — ABNORMAL LOW (ref 36.0–46.0)
Hemoglobin: 10.1 g/dL — ABNORMAL LOW (ref 12.0–15.0)
Lymphocytes Relative: 15 %
Lymphs Abs: 1.8 10*3/uL (ref 0.7–4.0)
MCH: 32.7 pg (ref 26.0–34.0)
MCHC: 33.8 g/dL (ref 30.0–36.0)
MCV: 96.8 fL (ref 78.0–100.0)
Monocytes Absolute: 1.1 10*3/uL — ABNORMAL HIGH (ref 0.1–1.0)
Monocytes Relative: 9 %
Neutro Abs: 8.9 10*3/uL — ABNORMAL HIGH (ref 1.7–7.7)
Neutrophils Relative %: 76 %
Platelets: 181 10*3/uL (ref 150–400)
RBC: 3.09 MIL/uL — ABNORMAL LOW (ref 3.87–5.11)
RDW: 22.5 % — ABNORMAL HIGH (ref 11.5–15.5)
WBC: 11.8 10*3/uL — ABNORMAL HIGH (ref 4.0–10.5)

## 2018-05-17 LAB — COMPREHENSIVE METABOLIC PANEL
ALT: 25 U/L (ref 14–54)
AST: 37 U/L (ref 15–41)
Albumin: 3.8 g/dL (ref 3.5–5.0)
Alkaline Phosphatase: 51 U/L (ref 38–126)
Anion gap: 8 (ref 5–15)
BUN: 24 mg/dL — ABNORMAL HIGH (ref 6–20)
CO2: 26 mmol/L (ref 22–32)
Calcium: 9.2 mg/dL (ref 8.9–10.3)
Chloride: 95 mmol/L — ABNORMAL LOW (ref 101–111)
Creatinine, Ser: 0.79 mg/dL (ref 0.44–1.00)
GFR calc Af Amer: >60 mL/min (ref >60)
GFR calc non Af Amer: >60 mL/min (ref >60)
Glucose, Bld: 104 mg/dL — ABNORMAL HIGH (ref 65–99)
Potassium: 4.3 mmol/L (ref 3.5–5.1)
Sodium: 129 mmol/L — ABNORMAL LOW (ref 135–145)
Total Bilirubin: 4 mg/dL — ABNORMAL HIGH (ref 0.3–1.2)
Total Protein: 5.8 g/dL — ABNORMAL LOW (ref 6.5–8.1)

## 2018-05-17 LAB — HCV AB W REFLEX TO QUANT PCR: HCV Ab: <0.1 s/co ratio (ref 0.0–0.9)

## 2018-05-17 LAB — C3 COMPLEMENT: C3 Complement: 89 mg/dL (ref 82–167)

## 2018-05-17 LAB — BILIRUBIN, DIRECT: Bilirubin, Direct: 0.4 mg/dL (ref 0.1–0.5)

## 2018-05-17 LAB — DRVVT MIX: dRVVT Mix: 39.6 s (ref 0.0–47.0)

## 2018-05-17 LAB — GLUCOSE, CAPILLARY: Glucose-Capillary: 105 mg/dL — ABNORMAL HIGH (ref 65–99)

## 2018-05-17 LAB — LUPUS ANTICOAGULANT PANEL
DRVVT: 48.6 s — ABNORMAL HIGH (ref 0.0–47.0)
PTT Lupus Anticoagulant: 48.9 s (ref 0.0–51.9)

## 2018-05-17 LAB — LACTATE DEHYDROGENASE: LDH: 335 U/L — ABNORMAL HIGH (ref 98–192)

## 2018-05-17 LAB — PROTIME-INR
INR: 3.33
Prothrombin Time: 33.6 seconds — ABNORMAL HIGH (ref 11.4–15.2)

## 2018-05-17 LAB — HAPTOGLOBIN: Haptoglobin: <10 mg/dL — ABNORMAL LOW (ref 34–200)

## 2018-05-17 LAB — C4 COMPLEMENT: Complement C4, Body Fluid: 13 mg/dL — ABNORMAL LOW (ref 14–44)

## 2018-05-17 LAB — HCV INTERPRETATION

## 2018-05-17 NOTE — Progress Notes (Signed)
IP PROGRESS NOTE  Subjective:   She reports feeling better compared to hospital admission.  No new complaint.  Objective: Vital signs in last 24 hours: Blood pressure 118/70, pulse 73, temperature 98.1 F (36.7 C), temperature source Oral, resp. rate 20, height 5\' 3"  (1.6 m), weight 136 lb 11 oz (62 kg), SpO2 98 %.  Intake/Output from previous day: 05/24 0701 - 05/25 0700 In: 740 [P.O.:740] Out: 300 [Urine:300]  Physical Exam:  HEENT: No thrush, scleral icterus Lungs: Scattered mild wheeze, no respiratory distress Cardiac: Regular Abdomen: No hepatosplenomegaly Extremities: No leg edema Lymph nodes: No cervical or supraclavicular nodes   Lab Results: Recent Labs    05/16/18 0516 05/17/18 0609  WBC 7.0 11.8*  HGB 10.5* 10.1*  HCT 30.7* 29.9*  PLT 173 181    BMET Recent Labs    05/16/18 0516 05/17/18 0609  NA 129* 129*  K 4.6 4.3  CL 96* 95*  CO2 23 26  GLUCOSE 138* 104*  BUN 23* 24*  CREATININE 0.91 0.79  CALCIUM 8.9 9.2   Bilirubin 4.0, direct bilirubin 0.4 No results found for: CEA1  Studies/Results: No results found.  Medications: I have reviewed the patient's current medications.  Assessment/Plan:  1.  Autoimmune hemolytic anemia, status post 2 units of packed red blood cells 05/14/2018  2.  History of coronary artery disease  3.  Status post aortic valve replacement, maintained on Coumadin  4.   Hyperlipidemia  5.   Hypothyroidism  Ms.Warriner has been diagnosed with autoimmune hemolytic anemia.  There is no apparent underlying systemic condition such as lymphoma or a collagen vascular disease to account for the hemolysis.  The hemoglobin has stabilized after a red cell transfusion earlier this week.  Recommendations: 1.  Continue daily Solu-Medrol, convert to prednisone if the hemoglobin is stable tomorrow 2.  Hematology will continue to follow her daily and outpatient follow-up will be arranged at the Cancer center.   LOS: 3 days    Betsy Coder, MD   05/17/2018, 9:35 AM

## 2018-05-17 NOTE — Progress Notes (Signed)
[facility] for Warfarin Indication: tissue AVR/Afib  Allergies  Allergen Reactions  . Ibuprofen Other (See Comments)    Hx of GI bleed  . Nucynta [Tapentadol] Other (See Comments)    Exact reaction not recalled, but knows she "cannot take"  . Ace Inhibitors Cough  . Doxycycline Nausea Only  . Hydrocodone Nausea Only  . Nsaids     Can only tolerate Tylenol (has a history of GI BLEEDS)  . Adhesive [Tape] Rash    Blisters (can use only paper tape)  . Latex Rash and Other (See Comments)    Paper tape only    Patient Measurements: Height: 5\' 3"  (160 cm) Weight: 136 lb 11 oz (62 kg) IBW/kg (Calculated) : 52.4  Vital Signs: Temp: 98.1 F (36.7 C) (05/25 0536) Temp Source: Oral (05/25 0536) BP: 118/70 (05/25 0536) Pulse Rate: 73 (05/25 0536)  Labs: Recent Labs    05/15/18 0546 05/16/18 0516 05/17/18 0609  HGB 11.7* 10.5* 10.1*  HCT 34.3* 30.7* 29.9*  PLT 166 173 181  LABPROT 24.5* 24.8* 33.6*  INR 2.23 2.27 3.33  CREATININE 0.74 0.91 0.79    Estimated Creatinine Clearance: 40.2 mL/min (by C-G formula based on SCr of 0.79 mg/dL).  Assessment:  Anticoag: Warfarin for hx afib, mech AVR - INR 2.27>3.33, Hgb 10.1 down s/p transfusion, pltc wnl, no bleeding noted Dose PTA= 6 mg SunMWFSat, 7 mg Tues, 8 mg Thurs  Goal of Therapy:  INR 2-3 Monitor platelets by anticoagulation protocol: Yes   Plan:  Hold warfarin tonight Daily INR   Francena Zender S. Alford Highland, PharmD, BCPS Clinical Staff Pharmacist  05/17/2018 10:44 AM

## 2018-05-17 NOTE — Progress Notes (Signed)
[facility] PROGRESS NOTE  Carol [name]:[id number] DOB: 06/10/1928 DOA: 05/13/2018 PCP: Darcus Austin, MD  Brief summary   84 y.o.femalewith medical history significant ofatrial fibrillation, HTN, CAD, HLD, thyroid disease,anticoagulation(Coumadin)for mechanical aortic valve, presents with 3-4 days of progressive, generalized weakness and lightheadedness. No syncope. No SOB, pain of any kind, vomiting, diarrhea. She describes some burning with urination but no fever or flank pain. She has intermittent mild nausea. She went to see her doctor today and was called this evening at home with report of abnormal labs, specifically (per daughter) hemoglobin of 9.5, sodium of 125, elevated bilirubin of 4.0. She was advised to come to the hospital for further evaluation and management.Patient reports no family history of pancreatic disease that she knows of. She personally has a history of aortic valve replacement. She has had a 2 pound weight loss per her primary care physician. Is being admitted into the hospital for further evaluation of painless jaundice with elevated bilirubin, hemoglobin of 7.9, and a sodium of 125.   Patient was transfused 2 units of PRBCs this morning and her hemoglobin improved from 7.9to13. FOBT negative. CT abdomen and pelvis with contrast negative for any acute findings.No significant intra-or extrahepatic biliary enlargement. Total bilirubin elevated at 4.9.  Repeated total bilirubin was persistently elevated but trending down.  AST, ALT, alkaline phosphatase normal.  The patient is asymptomatic CT abdomen pelvis with contrast revealed no acute findings. Elevated LDH and low haptoglobin. 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 Hematology following.  Highly appreciated.   Unclear etiology. Hg is stable after Transfused 2U prbcs 05/14/18. Symptoms improving. Elevated LDH and low haptoglobin.  FOBT negative on 05/13/18.  -hem/onc recommended to cont iv steroids, convert to prednisone tomorrow if Hg is stable    Hypertension. Add amlodipine 5 Milgram daily  Generalized weakness. Most likely secondary to above  Euvolemic hyponatremia, persistent  Elevated total bilirubin/injury bilirubin. Consistent with hemolytic anemia  Chronic A. fib on Coumadin. Rate controlled. INR is therapeutic  UTI, poa. Completed Rocephin. Stop Rocephin as recommended by hematology    Code Status: full Family Communication: d/w patient, RN (indicate person spoken with, relationship, and if by phone, the number) Disposition Plan: home 24-48 hrs    Consultants:  Hematology   Procedures:  none  Antibiotics: Anti-infectives (From admission, onward)   Start     Dose/Rate Route Frequency Ordered Stop   05/15/18 0000  cephALEXin (KEFLEX) 250 MG capsule     250 mg Oral 2 times daily 05/15/18 1215 05/20/18 2359   05/14/18 1000  cefTRIAXone (ROCEPHIN) 1 g in sodium chloride 0.9 % 100 mL IVPB  Status:  Discontinued     1 g 200 mL/hr over 30 Minutes Intravenous Every 24 hours 05/14/18 0954 05/15/18 1521        (indicate start date, and stop date if known)  HPI/Subjective: Alert. No distress. Reports feeling better   Objective: Vitals:   05/16/18 2055 05/17/18 0536  BP: (!) 145/63 118/70  Pulse: 80 73  Resp: 20 20  Temp: 98.3 F (36.8 C) 98.1 F (36.7 C)  SpO2: 97% 98%    Intake/Output Summary (Last 24 hours) at 05/17/2018 1245 Last data filed at 05/17/2018 1045 Gross per 24 hour  Intake 1000 ml  Output -  Net 1000 ml   Filed Weights   05/14/18 0118 05/15/18 1318 05/17/18 0536  Weight: 65.8 kg (145 lb) 62.8 kg (138 lb 6.4 oz) 62 kg (136 lb 11 oz)    Exam:   General:  No distress   Cardiovascular: s1,s2 rrr  Respiratory: cta bl   Abdomen: soft,  nt   Musculoskeletal: no leg edema    Data Reviewed: Basic Metabolic Panel: Recent Labs  Lab 05/13/18 2132 05/14/18 0839 05/15/18 0546 05/16/18 0516 05/17/18 0609  NA 125* 132* 133* 129* 129*  K 3.9 4.5 4.3 4.6 4.3  CL 92* 99* 101 96* 95*  CO2 23 25 22 23 26   GLUCOSE 104* 124* 95 138* 104*  BUN 18 12 14  23* 24*  CREATININE 0.92 0.81 0.74 0.91 0.79  CALCIUM 9.0 9.6 9.2 8.9 9.2   Liver Function Tests: Recent Labs  Lab 05/13/18 2132 05/14/18 0837 05/15/18 0546 05/16/18 0516 05/17/18 0609  AST 33 37 40 34 37  ALT 15 16 17 17 25   ALKPHOS 47 54 48 48 51  BILITOT 4.3* 7.0* 5.7* 4.2* 4.0*  PROT 6.0* 6.9 5.9* 5.7* 5.8*  ALBUMIN 4.0 4.5 4.2 3.8 3.8   Recent Labs  Lab 05/14/18 0839  LIPASE 47   No results for input(s): AMMONIA in the last 168 hours. CBC: Recent Labs  Lab 05/13/18 2132 05/14/18 0839 05/15/18 0546 05/16/18 0516 05/17/18 0609  WBC 8.0 10.7* 8.8 7.0 11.8*  NEUTROABS  --   --   --  5.6 8.9*  HGB 7.9* 13.1 11.7* 10.5* 10.1*  HCT 22.7* 38.4 34.3* 30.7* 29.9*  MCV 100.9* 93.2 97.2 95.6 96.8  PLT 264 199 166 173 181   Cardiac Enzymes: No results for input(s): CKTOTAL, CKMB, CKMBINDEX, TROPONINI in the last 168 hours. BNP (last 3 results) No results for input(s): BNP in the last 8760 hours.  ProBNP (last 3 results) No results for input(s): PROBNP in the last 8760 hours.  CBG: Recent Labs  Lab 05/14/18 0837 05/15/18 0740 05/16/18 0747 05/17/18 0748  GLUCAP 123* 97 136* 105*    Recent Results (from the past 240 hour(s))  Urine culture     Status: None   Collection Time: 05/13/18 11:00 PM  Result Value Ref Range Status   Specimen Description URINE, RANDOM  Final   Special Requests NONE  Final   Culture   Final    NO GROWTH Performed at [facility], [place] [address], [place], [place] [address]    Report Status 05/15/2018 FINAL  Final     Studies: No results found.  Scheduled Meds: . acidophilus  1 capsule Oral Daily  .  ALPRAZolam  0.25 mg Oral QHS  . amLODipine  5 mg Oral Daily  . atorvastatin  20 mg Oral QHS  . calcium-vitamin D  1 tablet Oral Q breakfast  . docusate sodium  200 mg Oral QHS  . gabapentin  200 mg Oral TID  . levothyroxine  88 mcg Oral QAC breakfast  . losartan  100 mg Oral Daily  . methylPREDNISolone (SOLU-MEDROL) injection  125 mg Intravenous Daily  . metoprolol tartrate  25 mg Oral BID  . multivitamin with minerals  1 tablet Oral Daily  . oxybutynin  5 mg Oral QODAY  . Warfarin - Pharmacist Dosing Inpatient   Does not apply q1800   Continuous Infusions:  Principal Problem:   Acute blood loss anemia Active Problems:   Hypertensive heart disease without CHF   Hyperlipidemia   Hypothyroidism   S/P AVR and CABG   Hyponatremia   Atrial fibrillation (HCC)  Long term (current) use of anticoagulants   Elevated bilirubin   Obstructive jaundice   AIHA (autoimmune hemolytic anemia) (HCC)    Time spent: >35 minutes     Kinnie Feil  [facility] Pager [phone]. If 7PM-7AM, please contact night-coverage at [email or web address], password [facility] 05/17/2018, 12:45 PM  LOS: 3 days

## 2018-05-17 NOTE — Discharge Instructions (Signed)

## 2018-05-18 DIAGNOSIS — R17 Unspecified jaundice: Secondary | ICD-10-CM

## 2018-05-18 DIAGNOSIS — Z7952 Long term (current) use of systemic steroids: Secondary | ICD-10-CM

## 2018-05-18 LAB — COMPREHENSIVE METABOLIC PANEL
ALT: 27 U/L (ref 14–54)
AST: 30 U/L (ref 15–41)
Albumin: 3.9 g/dL (ref 3.5–5.0)
Alkaline Phosphatase: 48 U/L (ref 38–126)
Anion gap: 7 (ref 5–15)
BUN: 30 mg/dL — ABNORMAL HIGH (ref 6–20)
CO2: 27 mmol/L (ref 22–32)
Calcium: 9.1 mg/dL (ref 8.9–10.3)
Chloride: 92 mmol/L — ABNORMAL LOW (ref 101–111)
Creatinine, Ser: 0.9 mg/dL (ref 0.44–1.00)
GFR calc Af Amer: >60 mL/min (ref >60)
GFR calc non Af Amer: 55 mL/min — ABNORMAL LOW (ref >60)
Glucose, Bld: 110 mg/dL — ABNORMAL HIGH (ref 65–99)
Potassium: 4.5 mmol/L (ref 3.5–5.1)
Sodium: 126 mmol/L — ABNORMAL LOW (ref 135–145)
Total Bilirubin: 4 mg/dL — ABNORMAL HIGH (ref 0.3–1.2)
Total Protein: 5.9 g/dL — ABNORMAL LOW (ref 6.5–8.1)

## 2018-05-18 LAB — BASIC METABOLIC PANEL
Anion gap: 10 (ref 5–15)
BUN: 26 mg/dL — ABNORMAL HIGH (ref 6–20)
CO2: 27 mmol/L (ref 22–32)
Calcium: 9.4 mg/dL (ref 8.9–10.3)
Chloride: 90 mmol/L — ABNORMAL LOW (ref 101–111)
Creatinine, Ser: 1.05 mg/dL — ABNORMAL HIGH (ref 0.44–1.00)
GFR calc Af Amer: 53 mL/min — ABNORMAL LOW (ref >60)
GFR calc non Af Amer: 46 mL/min — ABNORMAL LOW (ref >60)
Glucose, Bld: 105 mg/dL — ABNORMAL HIGH (ref 65–99)
Potassium: 3.5 mmol/L (ref 3.5–5.1)
Sodium: 127 mmol/L — ABNORMAL LOW (ref 135–145)

## 2018-05-18 LAB — CBC WITH DIFFERENTIAL/PLATELET
Basophils Absolute: 0 10*3/uL (ref 0.0–0.1)
Basophils Relative: 0 %
Eosinophils Absolute: 0 10*3/uL (ref 0.0–0.7)
Eosinophils Relative: 0 %
HCT: 30.5 % — ABNORMAL LOW (ref 36.0–46.0)
Hemoglobin: 10.5 g/dL — ABNORMAL LOW (ref 12.0–15.0)
Lymphocytes Relative: 17 %
Lymphs Abs: 1.9 10*3/uL (ref 0.7–4.0)
MCH: 33.1 pg (ref 26.0–34.0)
MCHC: 34.4 g/dL (ref 30.0–36.0)
MCV: 96.2 fL (ref 78.0–100.0)
Monocytes Absolute: 1 10*3/uL (ref 0.1–1.0)
Monocytes Relative: 9 %
Neutro Abs: 8.4 10*3/uL — ABNORMAL HIGH (ref 1.7–7.7)
Neutrophils Relative %: 74 %
Platelets: 189 10*3/uL (ref 150–400)
RBC: 3.17 MIL/uL — ABNORMAL LOW (ref 3.87–5.11)
RDW: 21.5 % — ABNORMAL HIGH (ref 11.5–15.5)
WBC: 11.3 10*3/uL — ABNORMAL HIGH (ref 4.0–10.5)

## 2018-05-18 LAB — RETICULOCYTES
RBC.: 3.16 MIL/uL — ABNORMAL LOW (ref 3.87–5.11)
Retic Count, Absolute: 508.8 10*3/uL — ABNORMAL HIGH (ref 19.0–186.0)
Retic Ct Pct: 16.1 % — ABNORMAL HIGH (ref 0.4–3.1)

## 2018-05-18 LAB — BILIRUBIN, DIRECT: Bilirubin, Direct: 0.4 mg/dL (ref 0.1–0.5)

## 2018-05-18 LAB — HAPTOGLOBIN: Haptoglobin: <10 mg/dL — ABNORMAL LOW (ref 34–200)

## 2018-05-18 LAB — GLUCOSE, CAPILLARY: Glucose-Capillary: 115 mg/dL — ABNORMAL HIGH (ref 65–99)

## 2018-05-18 LAB — PROTIME-INR
INR: 2.7
Prothrombin Time: 28.4 seconds — ABNORMAL HIGH (ref 11.4–15.2)

## 2018-05-18 LAB — LACTATE DEHYDROGENASE: LDH: 336 U/L — ABNORMAL HIGH (ref 98–192)

## 2018-05-18 MED ORDER — PREDNISONE 20 MG PO TABS: 60.0000 mg | ORAL_TABLET | Freq: Every day | ORAL | 0 refills | Status: DC

## 2018-05-18 MED ORDER — WARFARIN SODIUM 6 MG PO TABS
6.0000 mg | ORAL_TABLET | Freq: Once | ORAL | Status: DC
  Filled 2018-05-18: qty 1

## 2018-05-18 NOTE — Discharge Summary (Signed)
Physician Discharge Summary  Carol [name] ZOX:[id number] DOB: 1928/09/18 DOA: 05/13/2018  PCP: Darcus Austin, MD  Admit date: 05/13/2018 Discharge date: 05/18/2018  Time spent: >35 minutes  Recommendations for Outpatient Follow-up:  F/u with hematology on 05/21/18 F/u with PCP in 3-5 days, repeat BMP  Discharge Diagnoses:  Principal Problem:   Acute blood loss anemia Active Problems:   Hypertensive heart disease without CHF   Hyperlipidemia   Hypothyroidism   S/P AVR and CABG   Hyponatremia   Atrial fibrillation ([place])   Long term (current) use of anticoagulants   Elevated bilirubin   Obstructive jaundice   AIHA (autoimmune hemolytic anemia) ([place])   Discharge Condition: stable   Diet recommendation: regular   Filed Weights   05/17/18 0536 05/17/18 1330 05/18/18 0500  Weight: 62 kg (136 lb 11 oz) 63 kg (138 lb 12.8 oz) 61.9 kg (136 lb 7.4 oz)    History of present illness:   84 y.o.femalewith medical history significant ofatrial fibrillation, HTN, CAD, HLD, thyroid disease,anticoagulation(Coumadin)for mechanical aortic valve, presents with 3-4 days of progressive, generalized weakness and lightheadedness. No syncope. No SOB, pain of any kind, vomiting, diarrhea. She describes some burning with urination but no fever or flank pain. She has intermittent mild nausea. She went to see her doctor today and was called this evening at home with report of abnormal labs, specifically (per daughter) hemoglobin of 9.5, sodium of 125, elevated bilirubin of 4.0. She was advised to come to the hospital for further evaluation and management.Patient reports no family history of pancreatic disease that she knows of. She personally has a history of aortic valve replacement. She has had a 2 pound weight loss per her primary care physician. Is being admitted into the hospital for further evaluation of painless jaundice with elevated bilirubin, hemoglobin of 7.9, and a sodium of  125.   Patient was transfused 82 units of PRBCs this morning and her hemoglobin improved from 7.9to13. FOBT negative. CT abdomen and pelvis with contrast negative for any acute findings.No significant intra-or extrahepatic biliary enlargement. Total bilirubin elevated at 4.9. Repeated total bilirubin was persistently elevated but trending down. AST, ALT, alkaline phosphatase normal. The patient is asymptomatic CT abdomen pelvis with contrast revealed no acute findings. Elevated LDH and low haptoglobin. 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 Unclear etiology. Hg is stable after Transfused 2U prbcs 05/14/18. Symptoms improved. Elevated LDH and low haptoglobin.  FOBT negative on 05/13/18.  -per hem/onc recommendations- received iv steroids, then converted to prednisone 60 mg on 5/27. F/u with repeat hemolysis labs on 05/21/18 per hematology.  Will need prednisone taper with follow up hematology evaluation     Hypertension. resume home regimen   Euvolemic hyponatremia, persistent. Hyponatremia sine 2013. Asymptomatic. Recommended to repeat labs in 3-5 days   Elevated total bilirubin/injury bilirubin. Consistent with hemolytic anemia  Chronic A. fib on Coumadin. Rate controlled. INR is therapeutic  UTI, poa. Completed Rocephin. Stopped Rocephin as recommended by hematology      Procedures:  none (i.e. Studies not automatically included, echos, thoracentesis, etc; not x-rays)  Consultations:  Hem/onc  Discharge Exam: Vitals:   05/17/18 2144 05/18/18 0700  BP: 117/78 (!) 180/81  Pulse: 89 69  Resp: 18 18  Temp: 98.4 F (36.9 C) 98 F (36.7 C)  SpO2: 98% 100%    General: no distress  Cardiovascular: s1,s2 rrr Respiratory: CTA BL  Discharge Instructions  Discharge  Instructions    Diet - low sodium heart healthy   Complete by:  As directed    Discharge instructions   Complete by:  As directed    Please follow up with hematology on 05/21/18 Please follow up with primary care doctor in 3-7 days to check sodium levels   Increase activity slowly   Complete by:  As directed      Allergies as of 05/18/2018      Reactions   Ibuprofen Other (See Comments)   Hx of GI bleed   Nucynta [tapentadol] Other (See Comments)   Exact reaction not recalled, but knows she "cannot take"   Ace Inhibitors Cough   Doxycycline Nausea Only   Hydrocodone Nausea Only   Nsaids    Can only tolerate Tylenol (has a history of GI BLEEDS)   Adhesive [tape] Rash   Blisters (can use only paper tape)   Latex Rash, Other (See Comments)   Paper tape only      Medication List    STOP taking these medications   acetaminophen 500 MG tablet Commonly known as:  TYLENOL     TAKE these medications   ALPRAZolam 0.25 MG tablet Commonly known as:  XANAX Take 0.25 mg by mouth at bedtime.   atorvastatin 20 MG tablet Commonly known as:  LIPITOR Take 20 mg by mouth at bedtime.   BESIVANCE 0.6 % Susp Generic drug:  Besifloxacin HCl Place 1 drop into the left eye See admin instructions. Place 1 drop left eye 4 times daily for 2 days after Avastin eye injections   CALCIUM 600 + D PO Take 1 tablet by mouth every morning.   docusate sodium 100 MG capsule Commonly known as:  COLACE Take 100 mg by mouth at bedtime.   gabapentin 100 MG capsule Commonly known as:  NEURONTIN TAKE 2 CAPSULES BY MOUTH 3 TIMES A DAY   levothyroxine 88 MCG tablet Commonly known as:  SYNTHROID, LEVOTHROID TAKE 1 TABLET BY MOUTH EVERY MORNING ON AN EMPTY STOMACH   losartan 100 MG tablet Commonly known as:  COZAAR Take 100 mg by mouth daily.   metoprolol tartrate 25 MG tablet Commonly known as:  LOPRESSOR Take 25 mg by mouth 2 (two) times daily.   multivitamin with minerals Tabs tablet Take 1  tablet by mouth daily.   oxybutynin 5 MG tablet Commonly known as:  DITROPAN Take 5 mg by mouth every other day.   polyethylene glycol packet Commonly known as:  MIRALAX / GLYCOLAX Take 17 g by mouth daily as needed for mild constipation.   predniSONE 20 MG tablet Commonly known as:  DELTASONE Take 3 tablets (60 mg total) by mouth daily with breakfast. Start taking on:  05/19/2018   PROBIOTIC PO Take 1 capsule by mouth daily.   warfarin 5 MG tablet Commonly known as:  COUMADIN Take as directed. If you are unsure how to take this medication, talk to your nurse or doctor. Original instructions:  Take 5 mg by mouth at bedtime.   warfarin 1 MG tablet Commonly known as:  COUMADIN Take as directed. If you are unsure how to take this medication, talk to your nurse or doctor. Original instructions:  Take 1-3 mg by mouth See admin instructions. Take 1 mg by mouth at bedtime on Sun/Mon/Wed/Fri/Sat and 2 mg on Tues and 3 mg on Thurs, in conjunction with one 5 mg tablet      Allergies  Allergen Reactions  . Ibuprofen Other (See Comments)    Hx of GI bleed  . Nucynta [Tapentadol] Other (See Comments)    Exact reaction not recalled, but knows she "cannot take"  . Ace Inhibitors Cough  . Doxycycline Nausea Only  . Hydrocodone Nausea Only  . Nsaids     Can only tolerate Tylenol (has a history of GI BLEEDS)  . Adhesive [Tape] Rash    Blisters (can use only paper tape)  . Latex Rash and Other (See Comments)    Paper tape only      The results of significant diagnostics from this hospitalization (including imaging, microbiology, ancillary and laboratory) are listed below for reference.    Significant Diagnostic Studies: Ct Abdomen Pelvis W Contrast  Result Date: 05/14/2018 CLINICAL DATA:  Elevated bilirubin EXAM: CT ABDOMEN AND PELVIS WITH CONTRAST TECHNIQUE: Multidetector CT imaging of the abdomen and pelvis was performed using the standard protocol following bolus administration  of intravenous contrast. CONTRAST:  162mL OMNIPAQUE IOHEXOL 300 MG/ML  SOLN COMPARISON:  PET-CT 11/20/2011, CT abdomen pelvis 01/23/2008 FINDINGS: Lower chest: Lung bases demonstrate no acute consolidation or pleural effusion. There is cardiomegaly. Small distal esophageal hernia. Hepatobiliary: No focal hepatic abnormality. Status post cholecystectomy. No significant intra or extrahepatic biliary enlargement. Pancreas: Unremarkable. No pancreatic ductal dilatation or surrounding inflammatory changes. Spleen: Normal in size without focal abnormality. Adrenals/Urinary Tract: Adrenal glands are within normal limits. Cysts within the bilateral kidneys including right parapelvic cysts. Prominent extrarenal pelvis left greater than right, without ureteral stone. The bladder is Stomach/Bowel: Stomach is nonenlarged. No dilated small bowel. No colon wall thickening. Postsurgical changes at the cecum and rectosigmoid colon. Vascular/Lymphatic: Severe aortic atherosclerosis without aneurysmal dilatation. No significantly enlarged lymph nodes. Reproductive: Uterus and bilateral adnexa are unremarkable. Other: Negative for free air or free fluid. Interval right lower quadrant ventral hernia repair. Musculoskeletal: Scoliosis and degenerative changes of the spine. Laminectomy changes at L4-L5. IMPRESSION: 1. Status post cholecystectomy. No significant intra or extrahepatic biliary enlargement. 2. There are no acute abnormalities visualize within the abdomen or pelvis 3. Bilateral renal cysts Electronically Signed   By: Donavan Foil M.D.   On: 05/14/2018 01:42    Microbiology: Recent Results (from the past 240 hour(s))  Urine culture     Status: None   Collection Time: 05/13/18 11:00 PM  Result Value Ref Range Status   Specimen Description URINE, RANDOM  Final   Special Requests NONE  Final   Culture   Final    NO GROWTH Performed at [facility], [name], [place], [place] [address]    Report Status  05/15/2018 FINAL  Final     Labs: Basic Metabolic Panel: Recent Labs  Lab 05/15/18 0546 05/16/18 0516 05/17/18 0609 05/18/18 0410 05/18/18 1158  NA 133* 129* 129* 126* 127*  K 4.3 4.6 4.3 4.5 3.5  CL 101 96* 95* 92* 90*  CO2 22 23 26 27 27   GLUCOSE 95 138* 104* 110* 105*  BUN 14 23* 24* 30* 26*  CREATININE 0.74 0.91 0.79 0.90 1.05*  CALCIUM 9.2 8.9 9.2 9.1 9.4   Liver Function Tests: Recent Labs  Lab 05/14/18 0837 05/15/18 0546 05/16/18 0516 05/17/18 0609 05/18/18 0410  AST 37 40 34 37 30  ALT 16 17 17 25 27   ALKPHOS 54 48 48 51 48  BILITOT 7.0* 5.7* 4.2* 4.0* 4.0*  PROT 6.9 5.9* 5.7* 5.8* 5.9*  ALBUMIN 4.5 4.2 3.8 3.8 3.9   Recent Labs  Lab  05/14/18 0839  LIPASE 47   No results for input(s): AMMONIA in the last 168 hours. CBC: Recent Labs  Lab 05/14/18 0839 05/15/18 0546 05/16/18 0516 05/17/18 0609 05/18/18 0410  WBC 10.7* 8.8 7.0 11.8* 11.3*  NEUTROABS  --   --  5.6 8.9* 8.4*  HGB 13.1 11.7* 10.5* 10.1* 10.5*  HCT 38.4 34.3* 30.7* 29.9* 30.5*  MCV 93.2 97.2 95.6 96.8 96.2  PLT 199 166 173 181 189   Cardiac Enzymes: No results for input(s): CKTOTAL, CKMB, CKMBINDEX, TROPONINI in the last 168 hours. BNP: BNP (last 3 results) No results for input(s): BNP in the last 8760 hours.  ProBNP (last 3 results) No results for input(s): PROBNP in the last 8760 hours.  CBG: Recent Labs  Lab 05/14/18 0837 05/15/18 0740 05/16/18 0747 05/17/18 0748 05/18/18 0756  GLUCAP 123* 97 136* 105* 115*       Signed:  Rowe Clack [name]  [facility] 05/18/2018, 2:16 PM

## 2018-05-18 NOTE — Care Management Note (Signed)
Case Management Note  Patient Details  Name: Carol [name] MRN: [id number] Date of Birth: [birth year]  Subjective/Objective:                    Action/Plan:  Spoke to patient and her grand daughter at bedside. Patient is from independent living at  [place] with Vaughan Basta at [facility] requested DC summary and home health orders be faxed to her at [phone] and she will arrange HHPT. Patient has rollator Expected Discharge Date:  05/18/18               Expected Discharge Plan:  [place]  In-House Referral:     Discharge planning Services  CM Consult  Post Acute Care Choice:  Home Health Choice offered to:  Patient  DME Arranged:  N/A DME Agency:  NA  HH Arranged:  PT HH Agency:     Status of Service:  Completed, signed off  If discussed at [place] of Stay Meetings, dates discussed:    Additional Comments:  05/18/18 J. Camay Pedigo, RN, BSN Pt medically stable for discharge back to independent living facility today. Faxed HHPT order and discharge summary to [facility].([phone]).   Ella Bodo, RN 05/18/2018, 3:33 PM

## 2018-05-18 NOTE — Progress Notes (Signed)
Pt wants to go home. Ambulating with walker to the hall. Discharge instructions given to pt and daughter. Discharged to home.

## 2018-05-18 NOTE — Progress Notes (Signed)
[facility] for Warfarin Indication: tissue AVR/Afib  Allergies  Allergen Reactions  . Ibuprofen Other (See Comments)    Hx of GI bleed  . Nucynta [Tapentadol] Other (See Comments)    Exact reaction not recalled, but knows she "cannot take"  . Ace Inhibitors Cough  . Doxycycline Nausea Only  . Hydrocodone Nausea Only  . Nsaids     Can only tolerate Tylenol (has a history of GI BLEEDS)  . Adhesive [Tape] Rash    Blisters (can use only paper tape)  . Latex Rash and Other (See Comments)    Paper tape only    Patient Measurements: Height: 5\' 3"  (160 cm) Weight: 136 lb 7.4 oz (61.9 kg) IBW/kg (Calculated) : 52.4  Vital Signs: Temp: 98 F (36.7 C) (05/26 0700) Temp Source: Oral (05/26 0700) BP: 180/81 (05/26 0700) Pulse Rate: 69 (05/26 0700)  Labs: Recent Labs    05/16/18 0516 05/17/18 0609 05/18/18 0410  HGB 10.5* 10.1* 10.5*  HCT 30.7* 29.9* 30.5*  PLT 173 181 189  LABPROT 24.8* 33.6* 28.4*  INR 2.27 3.33 2.70  CREATININE 0.91 0.79 0.90    Estimated Creatinine Clearance: 35.7 mL/min (by C-G formula based on SCr of 0.9 mg/dL).  Assessment:  Anticoag: Warfarin for hx afib, mech AVR - INR 2.27>3.33>2.7 today, Hgb 10.5 s/p transfusion, pltc wnl, no bleeding noted. +Risaquad probiotic Dose PTA= 6 mg SunMWFSat, 7 mg Tues, 8 mg Thurs  Goal of Therapy:  INR 2-3 Monitor platelets by anticoagulation protocol: Yes   Plan:  Warfarin 6mg  po x 1 tonight. Daily INR Start prednisone tomorrow per Heme/Onc   Ingrid Shifrin S. Alford Highland, PharmD, BCPS Clinical Staff Pharmacist  05/18/2018 9:42 AM

## 2018-05-18 NOTE — Progress Notes (Signed)
IP PROGRESS NOTE  Subjective:   She continues to feel better.  She is ambulating.  No new complaint.  Objective: Vital signs in last 24 hours: Blood pressure 117/78, pulse 89, temperature 98.4 F (36.9 C), temperature source Oral, resp. rate 18, height 5\' 3"  (1.6 m), weight 136 lb 7.4 oz (61.9 kg), SpO2 98 %.  Intake/Output from previous day: 05/25 0701 - 05/26 0700 In: 1540 [P.O.:1540] Out: 1400 [Urine:750]  Physical Exam:  HEENT: No thrush, scleral icterus Lungs: Clear bilaterally Cardiac: Irregular Abdomen: No hepatosplenomegaly Extremities: No leg edema    Lab Results: Recent Labs    05/17/18 0609 05/18/18 0410  WBC 11.8* 11.3*  HGB 10.1* 10.5*  HCT 29.9* 30.5*  PLT 181 189    BMET Recent Labs    05/17/18 0609 05/18/18 0410  NA 129* 126*  K 4.3 4.5  CL 95* 92*  CO2 26 27  GLUCOSE 104* 110*  BUN 24* 30*  CREATININE 0.79 0.90  CALCIUM 9.2 9.1   Bilirubin 4.0, direct bilirubin 0.4 No results found for: CEA1  Studies/Results: No results found.  Medications: I have reviewed the patient's current medications.  Assessment/Plan:  1.  Autoimmune hemolytic anemia, status post 2 units of packed red blood cells 05/14/2018, prednisone 05/15/2018, Solu-Medrol started 05/16/2018  2.  History of coronary artery disease  3.  Status post aortic valve replacement, maintained on Coumadin  4.   Hyperlipidemia  5.   Hypothyroidism  Ms.Fok appears stable.  The hemoglobin has stabilized over the past 2 days.  She is stable for discharge from a hematologic standpoint.  She can receive the scheduled dose of Solu-Medrol today and then continue prednisone at discharge.  Recommendations: 1.  Prednisone-60 mg daily to begin 05/19/2018 2.  [facility] will be scheduled for 05/21/2018.   LOS: 4 days   Betsy Coder, MD   05/18/2018, 6:45 AM

## 2018-05-19 LAB — HAPTOGLOBIN: Haptoglobin: <10 mg/dL — ABNORMAL LOW (ref 34–200)

## 2018-05-19 LAB — COLD AGGLUTININ TITER: Cold Agglutinin Titer: NEGATIVE

## 2018-05-19 LAB — ANTINUCLEAR ANTIBODIES, IFA: ANA Ab, IFA: NEGATIVE

## 2018-05-20 ENCOUNTER — Telehealth: Payer: Self-pay | Admitting: Oncology

## 2018-05-20 NOTE — Telephone Encounter (Signed)
Spoke w/ pt's daughter re appts added on thursday

## 2018-05-21 ENCOUNTER — Other Ambulatory Visit: Payer: Self-pay | Admitting: Nurse Practitioner

## 2018-05-21 DIAGNOSIS — D591 Autoimmune hemolytic anemia, unspecified: Secondary | ICD-10-CM

## 2018-05-21 LAB — BETA-2-GLYCOPROTEIN I ABS, IGG/M/A
Beta-2 Glyco I IgG: <9 GPI IgG units (ref 0–20)
Beta-2-Glycoprotein I IgA: <9 GPI IgA units (ref 0–25)
Beta-2-Glycoprotein I IgM: <9 GPI IgM units (ref 0–32)

## 2018-05-21 LAB — CARDIOLIPIN ANTIBODIES, IGG, IGM, IGA
Anticardiolipin IgA: <9 APL U/mL (ref 0–11)
Anticardiolipin IgG: <9 GPL U/mL (ref 0–14)
Anticardiolipin IgM: <9 MPL U/mL (ref 0–12)

## 2018-05-22 ENCOUNTER — Telehealth: Payer: Self-pay | Admitting: Nurse Practitioner

## 2018-05-22 ENCOUNTER — Inpatient Hospital Stay: Payer: Medicare Other

## 2018-05-22 ENCOUNTER — Inpatient Hospital Stay: Payer: Medicare Other | Attending: Nurse Practitioner | Admitting: Nurse Practitioner

## 2018-05-22 ENCOUNTER — Encounter: Payer: Self-pay | Admitting: Nurse Practitioner

## 2018-05-22 VITALS — BP 159/76 | HR 86 | Temp 97.6°F | Resp 18 | Ht 63.0 in | Wt 140.0 lb

## 2018-05-22 DIAGNOSIS — R5383 Other fatigue: Secondary | ICD-10-CM | POA: Insufficient documentation

## 2018-05-22 DIAGNOSIS — Z79899 Other long term (current) drug therapy: Secondary | ICD-10-CM | POA: Insufficient documentation

## 2018-05-22 DIAGNOSIS — R17 Unspecified jaundice: Secondary | ICD-10-CM | POA: Insufficient documentation

## 2018-05-22 DIAGNOSIS — D591 Autoimmune hemolytic anemia, unspecified: Secondary | ICD-10-CM

## 2018-05-22 DIAGNOSIS — E785 Hyperlipidemia, unspecified: Secondary | ICD-10-CM

## 2018-05-22 DIAGNOSIS — E039 Hypothyroidism, unspecified: Secondary | ICD-10-CM | POA: Insufficient documentation

## 2018-05-22 DIAGNOSIS — Z952 Presence of prosthetic heart valve: Secondary | ICD-10-CM | POA: Diagnosis not present

## 2018-05-22 DIAGNOSIS — R600 Localized edema: Secondary | ICD-10-CM | POA: Insufficient documentation

## 2018-05-22 DIAGNOSIS — Z7901 Long term (current) use of anticoagulants: Secondary | ICD-10-CM | POA: Insufficient documentation

## 2018-05-22 DIAGNOSIS — E871 Hypo-osmolality and hyponatremia: Secondary | ICD-10-CM

## 2018-05-22 DIAGNOSIS — Z7952 Long term (current) use of systemic steroids: Secondary | ICD-10-CM | POA: Insufficient documentation

## 2018-05-22 DIAGNOSIS — I2510 Atherosclerotic heart disease of native coronary artery without angina pectoris: Secondary | ICD-10-CM | POA: Diagnosis not present

## 2018-05-22 LAB — CMP (CANCER CENTER ONLY)
ALT: 37 U/L (ref 0–55)
AST: 32 U/L (ref 5–34)
Albumin: 4.2 g/dL (ref 3.5–5.0)
Alkaline Phosphatase: 52 U/L (ref 40–150)
Anion gap: 8 (ref 3–11)
BUN: 23 mg/dL (ref 7–26)
CO2: 27 mmol/L (ref 22–29)
Calcium: 9 mg/dL (ref 8.4–10.4)
Chloride: 89 mmol/L — ABNORMAL LOW (ref 98–109)
Creatinine: 0.82 mg/dL (ref 0.60–1.10)
GFR, Est AFR Am: >60 mL/min (ref >60)
GFR, Estimated: >60 mL/min (ref >60)
Glucose, Bld: 89 mg/dL (ref 70–140)
Potassium: 4.4 mmol/L (ref 3.5–5.1)
Sodium: 124 mmol/L — ABNORMAL LOW (ref 136–145)
Total Bilirubin: 2.2 mg/dL — ABNORMAL HIGH (ref 0.2–1.2)
Total Protein: 5.9 g/dL — ABNORMAL LOW (ref 6.4–8.3)

## 2018-05-22 LAB — CBC WITH DIFFERENTIAL (CANCER CENTER ONLY)
Basophils Absolute: 0 10*3/uL (ref 0.0–0.1)
Basophils Relative: 0 %
Eosinophils Absolute: 0.1 10*3/uL (ref 0.0–0.5)
Eosinophils Relative: 0 %
HCT: 32 % — ABNORMAL LOW (ref 34.8–46.6)
Hemoglobin: 11.1 g/dL — ABNORMAL LOW (ref 11.6–15.9)
Lymphocytes Relative: 13 %
Lymphs Abs: 1.6 10*3/uL (ref 0.9–3.3)
MCH: 34 pg (ref 25.1–34.0)
MCHC: 34.7 g/dL (ref 31.5–36.0)
MCV: 98.2 fL (ref 79.5–101.0)
Monocytes Absolute: 0.5 10*3/uL (ref 0.1–0.9)
Monocytes Relative: 4 %
Neutro Abs: 10.3 10*3/uL — ABNORMAL HIGH (ref 1.5–6.5)
Neutrophils Relative %: 83 %
Platelet Count: 202 10*3/uL (ref 145–400)
RBC: 3.26 MIL/uL — ABNORMAL LOW (ref 3.70–5.45)
RDW: 20.2 % — ABNORMAL HIGH (ref 11.2–14.5)
WBC Count: 12.4 10*3/uL — ABNORMAL HIGH (ref 3.9–10.3)

## 2018-05-22 LAB — SAMPLE TO BLOOD BANK

## 2018-05-22 LAB — RETICULOCYTES
RBC.: 3.24 MIL/uL — ABNORMAL LOW (ref 3.87–5.11)
Retic Count, Absolute: 602.6 10*3/uL — ABNORMAL HIGH (ref 19.0–186.0)
Retic Ct Pct: 18.6 % — ABNORMAL HIGH (ref 0.4–3.1)

## 2018-05-22 LAB — SAVE SMEAR

## 2018-05-22 NOTE — Telephone Encounter (Signed)
Scheduled apt per 5/30 los - pt is aware of appt date and time.

## 2018-05-22 NOTE — Progress Notes (Addendum)
  [place] OFFICE PROGRESS NOTE   Diagnosis: Autoimmune hemolytic anemia  INTERVAL HISTORY:   Carol [name] returns for her first office visit following hospital discharge.  She has autoimmune hemolytic anemia.  She was transfused 2 units of blood on 05/14/2018.  She received prednisone 05/15/2018.  Solu-Medrol initiated 05/16/2018.  She was discharged home on prednisone 60 mg daily.  She notes that she fatigues easily.  No longer having lightheadedness or dizziness.  Daughter thinks jaundice is better.  She is noting some difficulty sleeping.  No leg swelling.  No mouth discomfort.  Objective:  Vital signs in last 24 hours:  Blood pressure (!) 159/76, pulse 86, temperature 97.6 F (36.4 C), temperature source Oral, resp. rate 18, height 5\' 3"  (1.6 m), weight 140 lb (63.5 kg), SpO2 99 %.    HEENT: No thrush. Lymphatics: No palpable cervical or supraclavicular lymph nodes. Resp: Lungs clear bilaterally. Cardio: Regular rate and rhythm. GI: Abdomen soft and nontender.  No hepatosplenomegaly. Vascular: Trace lower leg edema bilaterally.  Chronic stasis change bilaterally. Neuro: Alert and oriented. Skin: Skin has a mild jaundice appearance.   Lab Results:  Lab Results  Component Value Date   WBC 12.4 (H) 05/22/2018   HGB 11.1 (L) 05/22/2018   HCT 32.0 (L) 05/22/2018   MCV 98.2 05/22/2018   PLT 202 05/22/2018   NEUTROABS 10.3 (H) 05/22/2018    Imaging:  No results found.  Medications: I have reviewed the patient's current medications.  Assessment/Plan: 1.  Autoimmune hemolytic anemia, status post 2 units of packed red blood cells 05/14/2018, prednisone 05/15/2018, Solu-Medrol started 05/16/2018; prednisone 60 mg daily beginning 05/19/2018  Prednisone taper to 40 mg daily beginning 05/23/2018  2.  History of coronary artery disease  3.  Status post aortic valve replacement, maintained on Coumadin  4.   Hyperlipidemia  5.    Hypothyroidism    Disposition: Carol [name] recently presented with severe anemia.  She was diagnosed with autoimmune hemolytic anemia.  She required transfusion support 05/14/2018.  Steroids initiated 05/15/2018.  She is currently taking prednisone 60 mg daily.  Hemoglobin continues to be improved with mild anemia on labs today.    She will taper the prednisone to 40 mg daily.  She will return for a follow-up CBC in 1 week.  We will see her in a follow-up visit in 2 weeks.  She will contact the office in the interim with any problems, specifically signs/symptoms suggestive of progressive anemia.  Patient seen with Dr. Benay Spice.    [name] ANP/GNP-BC   05/22/2018  9:42 AM Carol [name] appears unchanged.  The hemoglobin has stabilized on prednisone.  We tapered the prednisone to 40 mg daily.  The plan is to complete a slow prednisone taper.  She has been diagnosed with idiopathic autoimmune hemolytic anemia.  We updated her daughter.  Julieanne Manson, MD

## 2018-05-23 ENCOUNTER — Telehealth: Payer: Self-pay | Admitting: *Deleted

## 2018-05-23 NOTE — Telephone Encounter (Signed)
Patient advised as directed below. Confirms appt next week.

## 2018-05-23 NOTE — Telephone Encounter (Signed)
-----   Message from Owens Shark, NP sent at 05/23/2018  8:40 AM EDT ----- Please call daughter.  Bilirubin is better, etiology of the hyponatremia is unclear.  Follow-up as scheduled.

## 2018-05-26 DIAGNOSIS — Z7901 Long term (current) use of anticoagulants: Secondary | ICD-10-CM | POA: Diagnosis not present

## 2018-05-26 DIAGNOSIS — I4891 Unspecified atrial fibrillation: Secondary | ICD-10-CM | POA: Diagnosis not present

## 2018-05-26 DIAGNOSIS — I10 Essential (primary) hypertension: Secondary | ICD-10-CM | POA: Diagnosis not present

## 2018-05-26 DIAGNOSIS — D599 Acquired hemolytic anemia, unspecified: Secondary | ICD-10-CM | POA: Diagnosis not present

## 2018-05-28 ENCOUNTER — Encounter (INDEPENDENT_AMBULATORY_CARE_PROVIDER_SITE_OTHER): Payer: Medicare Other | Admitting: Ophthalmology

## 2018-05-29 ENCOUNTER — Inpatient Hospital Stay: Payer: Medicare Other | Attending: Oncology

## 2018-05-29 DIAGNOSIS — E785 Hyperlipidemia, unspecified: Secondary | ICD-10-CM | POA: Diagnosis not present

## 2018-05-29 DIAGNOSIS — Z7952 Long term (current) use of systemic steroids: Secondary | ICD-10-CM | POA: Diagnosis not present

## 2018-05-29 DIAGNOSIS — I2510 Atherosclerotic heart disease of native coronary artery without angina pectoris: Secondary | ICD-10-CM | POA: Diagnosis not present

## 2018-05-29 DIAGNOSIS — E039 Hypothyroidism, unspecified: Secondary | ICD-10-CM | POA: Insufficient documentation

## 2018-05-29 DIAGNOSIS — Z79899 Other long term (current) drug therapy: Secondary | ICD-10-CM | POA: Insufficient documentation

## 2018-05-29 DIAGNOSIS — D591 Autoimmune hemolytic anemia, unspecified: Secondary | ICD-10-CM

## 2018-05-29 LAB — CBC WITH DIFFERENTIAL (CANCER CENTER ONLY)
Basophils Absolute: 0 10*3/uL (ref 0.0–0.1)
Basophils Relative: 0 %
Eosinophils Absolute: 0 10*3/uL (ref 0.0–0.5)
Eosinophils Relative: 0 %
HCT: 35.9 % (ref 34.8–46.6)
Hemoglobin: 12.6 g/dL (ref 11.6–15.9)
Lymphocytes Relative: 6 %
Lymphs Abs: 0.8 10*3/uL — ABNORMAL LOW (ref 0.9–3.3)
MCH: 34.2 pg — ABNORMAL HIGH (ref 25.1–34.0)
MCHC: 35.1 g/dL (ref 31.5–36.0)
MCV: 97.6 fL (ref 79.5–101.0)
Monocytes Absolute: 0.2 10*3/uL (ref 0.1–0.9)
Monocytes Relative: 2 %
Neutro Abs: 12.1 10*3/uL — ABNORMAL HIGH (ref 1.5–6.5)
Neutrophils Relative %: 92 %
Platelet Count: 178 10*3/uL (ref 145–400)
RBC: 3.68 MIL/uL — ABNORMAL LOW (ref 3.70–5.45)
RDW: 17.1 % — ABNORMAL HIGH (ref 11.2–14.5)
WBC Count: 13.1 10*3/uL — ABNORMAL HIGH (ref 3.9–10.3)

## 2018-05-29 LAB — RETICULOCYTES
RBC.: 3.69 MIL/uL — ABNORMAL LOW (ref 3.87–5.11)
Retic Count, Absolute: 369 10*3/uL — ABNORMAL HIGH (ref 19.0–186.0)
Retic Ct Pct: 10 % — ABNORMAL HIGH (ref 0.4–3.1)

## 2018-05-29 LAB — SAMPLE TO BLOOD BANK

## 2018-05-30 ENCOUNTER — Telehealth: Payer: Self-pay | Admitting: Emergency Medicine

## 2018-05-30 ENCOUNTER — Other Ambulatory Visit: Payer: Self-pay | Admitting: Emergency Medicine

## 2018-05-30 MED ORDER — PREDNISONE 10 MG PO TABS: 30.0000 mg | ORAL_TABLET | Freq: Every day | ORAL | 0 refills | Status: DC

## 2018-05-30 NOTE — Telephone Encounter (Addendum)
Pt daughter verbalized understanding of this note. Sending in scrip for prednisone. Pt only has 3 days worth left.  ----- Message from Ladell Pier, MD sent at 05/29/2018  5:03 PM EDT ----- Please call patient, the hemoglobin is better, decrease the prednisone to 30 mg daily, follow-up as scheduled

## 2018-06-04 DIAGNOSIS — M6281 Muscle weakness (generalized): Secondary | ICD-10-CM | POA: Diagnosis not present

## 2018-06-04 DIAGNOSIS — D649 Anemia, unspecified: Secondary | ICD-10-CM | POA: Diagnosis not present

## 2018-06-04 DIAGNOSIS — R2681 Unsteadiness on feet: Secondary | ICD-10-CM | POA: Diagnosis not present

## 2018-06-05 DIAGNOSIS — R2681 Unsteadiness on feet: Secondary | ICD-10-CM | POA: Diagnosis not present

## 2018-06-05 DIAGNOSIS — D649 Anemia, unspecified: Secondary | ICD-10-CM | POA: Diagnosis not present

## 2018-06-05 DIAGNOSIS — M6281 Muscle weakness (generalized): Secondary | ICD-10-CM | POA: Diagnosis not present

## 2018-06-05 DIAGNOSIS — I4891 Unspecified atrial fibrillation: Secondary | ICD-10-CM | POA: Diagnosis not present

## 2018-06-05 DIAGNOSIS — Z7901 Long term (current) use of anticoagulants: Secondary | ICD-10-CM | POA: Diagnosis not present

## 2018-06-06 ENCOUNTER — Other Ambulatory Visit: Payer: Self-pay | Admitting: *Deleted

## 2018-06-06 ENCOUNTER — Encounter: Payer: Self-pay | Admitting: Nurse Practitioner

## 2018-06-06 ENCOUNTER — Inpatient Hospital Stay (HOSPITAL_BASED_OUTPATIENT_CLINIC_OR_DEPARTMENT_OTHER): Payer: Medicare Other | Admitting: Nurse Practitioner

## 2018-06-06 ENCOUNTER — Inpatient Hospital Stay: Payer: Medicare Other

## 2018-06-06 ENCOUNTER — Telehealth: Payer: Self-pay

## 2018-06-06 VITALS — BP 182/51 | HR 83 | Temp 97.7°F | Resp 18 | Ht 63.0 in | Wt 139.3 lb

## 2018-06-06 DIAGNOSIS — Z7952 Long term (current) use of systemic steroids: Secondary | ICD-10-CM | POA: Diagnosis not present

## 2018-06-06 DIAGNOSIS — D591 Autoimmune hemolytic anemia, unspecified: Secondary | ICD-10-CM

## 2018-06-06 DIAGNOSIS — I2510 Atherosclerotic heart disease of native coronary artery without angina pectoris: Secondary | ICD-10-CM | POA: Diagnosis not present

## 2018-06-06 DIAGNOSIS — E785 Hyperlipidemia, unspecified: Secondary | ICD-10-CM | POA: Diagnosis not present

## 2018-06-06 DIAGNOSIS — E039 Hypothyroidism, unspecified: Secondary | ICD-10-CM

## 2018-06-06 DIAGNOSIS — Z79899 Other long term (current) drug therapy: Secondary | ICD-10-CM

## 2018-06-06 LAB — CMP (CANCER CENTER ONLY)
ALT: 21 U/L (ref 0–55)
AST: 25 U/L (ref 5–34)
Albumin: 4.3 g/dL (ref 3.5–5.0)
Alkaline Phosphatase: 53 U/L (ref 40–150)
Anion gap: 8 (ref 3–11)
BUN: 22 mg/dL (ref 7–26)
CO2: 27 mmol/L (ref 22–29)
Calcium: 9.3 mg/dL (ref 8.4–10.4)
Chloride: 89 mmol/L — ABNORMAL LOW (ref 98–109)
Creatinine: 0.87 mg/dL (ref 0.60–1.10)
GFR, Est AFR Am: >60 mL/min (ref >60)
GFR, Estimated: 58 mL/min — ABNORMAL LOW (ref >60)
Glucose, Bld: 112 mg/dL (ref 70–140)
Potassium: 4.2 mmol/L (ref 3.5–5.1)
Sodium: 124 mmol/L — ABNORMAL LOW (ref 136–145)
Total Bilirubin: 2.1 mg/dL — ABNORMAL HIGH (ref 0.2–1.2)
Total Protein: 6 g/dL — ABNORMAL LOW (ref 6.4–8.3)

## 2018-06-06 LAB — CBC WITH DIFFERENTIAL (CANCER CENTER ONLY)
Basophils Absolute: 0 10*3/uL (ref 0.0–0.1)
Basophils Relative: 0 %
Eosinophils Absolute: 0 10*3/uL (ref 0.0–0.5)
Eosinophils Relative: 0 %
HCT: 34.4 % — ABNORMAL LOW (ref 34.8–46.6)
Hemoglobin: 12.4 g/dL (ref 11.6–15.9)
Lymphocytes Relative: 5 %
Lymphs Abs: 0.6 10*3/uL — ABNORMAL LOW (ref 0.9–3.3)
MCH: 35 pg — ABNORMAL HIGH (ref 25.1–34.0)
MCHC: 36 g/dL (ref 31.5–36.0)
MCV: 97.2 fL (ref 79.5–101.0)
Monocytes Absolute: 0.5 10*3/uL (ref 0.1–0.9)
Monocytes Relative: 4 %
Neutro Abs: 11.9 10*3/uL — ABNORMAL HIGH (ref 1.5–6.5)
Neutrophils Relative %: 91 %
Platelet Count: 148 10*3/uL (ref 145–400)
RBC: 3.54 MIL/uL — ABNORMAL LOW (ref 3.70–5.45)
RDW: 15.9 % — ABNORMAL HIGH (ref 11.2–14.5)
WBC Count: 13 10*3/uL — ABNORMAL HIGH (ref 3.9–10.3)

## 2018-06-06 LAB — RETICULOCYTES
RBC.: 3.62 MIL/uL — ABNORMAL LOW (ref 3.87–5.11)
Retic Count, Absolute: 333 10*3/uL — ABNORMAL HIGH (ref 19.0–186.0)
Retic Ct Pct: 9.2 % — ABNORMAL HIGH (ref 0.4–3.1)

## 2018-06-06 LAB — SAVE SMEAR

## 2018-06-06 NOTE — Progress Notes (Addendum)
  [place] OFFICE PROGRESS NOTE   Diagnosis: Autoimmune hemolytic anemia  INTERVAL HISTORY:   Carol [name] returns as scheduled.  She continues prednisone 30 mg daily.  She notes some difficulty sleeping and periodically feels jittery.  No mouth discomfort.  No leg swelling.  She recently resumed physical therapy.  Objective:  Vital signs in last 24 hours:  Blood pressure (!) 182/51, pulse 83, temperature 97.7 F (36.5 C), temperature source Oral, resp. rate 18, height 5\' 3"  (1.6 m), weight 139 lb 4.8 oz (63.2 kg), SpO2 99 %.    HEENT: No thrush or ulcers. Resp: Lungs clear bilaterally. Cardio: Regular rate and rhythm. GI: Abdomen soft and nontender.  No hepatospleno megaly. Vascular: No leg edema.  Chronic stasis change bilaterally.    Lab Results:  Lab Results  Component Value Date   WBC 13.0 (H) 06/06/2018   HGB 12.4 06/06/2018   HCT 34.4 (L) 06/06/2018   MCV 97.2 06/06/2018   PLT 148 06/06/2018   NEUTROABS 11.9 (H) 06/06/2018    Imaging:  No results found.  Medications: I have reviewed the patient's current medications.  Assessment/Plan: 1. Autoimmune hemolytic anemia, status post 2 units of packed red blood cells 05/14/2018, prednisone 05/15/2018, Solu-Medrol started 05/16/2018; prednisone 60 mg daily beginning 05/19/2018  Prednisone taper to 40 mg daily beginning 05/23/2018  Prednisone taper to 30 mg daily beginning 05/30/2018  2. History of coronary artery disease  3. Status post aortic valve replacement, maintained on Coumadin  4. Hyperlipidemia  5. Hypothyroidism    Disposition: Carol [name] appears unchanged.  Hemoglobin is stable in the normal range.  Reticulocyte count and bilirubin remain elevated.  She will continue prednisone 30 mg daily for 1 week and then decrease to 20 mg daily.  We will see her in follow-up with labs in 3 weeks.  She will contact the office in the interim with any problems.  Patient seen with Dr.  Benay Spice.    [name] ANP/GNP-BC   06/06/2018  10:35 AM This was a shared visit with [name].  The hemoglobin is stable while on a prednisone taper.  She will continue a slow prednisone taper.  She will contact us for symptoms of anemia.  Julieanne Manson, MD

## 2018-06-06 NOTE — Telephone Encounter (Signed)
Printed avs and calender of upcoming appointment. Per 6/14 los

## 2018-06-06 NOTE — Patient Instructions (Signed)
Continue Prednisone 30 mg daily On 6/21 decrease prednisone to 20 mg daily Return for f/u in 3 weeks

## 2018-06-09 ENCOUNTER — Encounter (INDEPENDENT_AMBULATORY_CARE_PROVIDER_SITE_OTHER): Payer: Medicare Other | Admitting: Ophthalmology

## 2018-06-09 DIAGNOSIS — I10 Essential (primary) hypertension: Secondary | ICD-10-CM | POA: Diagnosis not present

## 2018-06-09 DIAGNOSIS — H35033 Hypertensive retinopathy, bilateral: Secondary | ICD-10-CM

## 2018-06-09 DIAGNOSIS — H3411 Central retinal artery occlusion, right eye: Secondary | ICD-10-CM

## 2018-06-09 DIAGNOSIS — H348122 Central retinal vein occlusion, left eye, stable: Secondary | ICD-10-CM | POA: Diagnosis not present

## 2018-06-09 DIAGNOSIS — H43813 Vitreous degeneration, bilateral: Secondary | ICD-10-CM

## 2018-06-11 DIAGNOSIS — D649 Anemia, unspecified: Secondary | ICD-10-CM | POA: Diagnosis not present

## 2018-06-11 DIAGNOSIS — M6281 Muscle weakness (generalized): Secondary | ICD-10-CM | POA: Diagnosis not present

## 2018-06-11 DIAGNOSIS — R2681 Unsteadiness on feet: Secondary | ICD-10-CM | POA: Diagnosis not present

## 2018-06-12 DIAGNOSIS — Z7901 Long term (current) use of anticoagulants: Secondary | ICD-10-CM | POA: Diagnosis not present

## 2018-06-12 DIAGNOSIS — I4891 Unspecified atrial fibrillation: Secondary | ICD-10-CM | POA: Diagnosis not present

## 2018-06-18 DIAGNOSIS — M6281 Muscle weakness (generalized): Secondary | ICD-10-CM | POA: Diagnosis not present

## 2018-06-18 DIAGNOSIS — R2681 Unsteadiness on feet: Secondary | ICD-10-CM | POA: Diagnosis not present

## 2018-06-18 DIAGNOSIS — D649 Anemia, unspecified: Secondary | ICD-10-CM | POA: Diagnosis not present

## 2018-06-23 DIAGNOSIS — I4891 Unspecified atrial fibrillation: Secondary | ICD-10-CM | POA: Diagnosis not present

## 2018-06-23 DIAGNOSIS — Z7901 Long term (current) use of anticoagulants: Secondary | ICD-10-CM | POA: Diagnosis not present

## 2018-06-25 DIAGNOSIS — I10 Essential (primary) hypertension: Secondary | ICD-10-CM | POA: Diagnosis not present

## 2018-06-25 DIAGNOSIS — E039 Hypothyroidism, unspecified: Secondary | ICD-10-CM | POA: Diagnosis not present

## 2018-06-25 DIAGNOSIS — F419 Anxiety disorder, unspecified: Secondary | ICD-10-CM | POA: Diagnosis not present

## 2018-06-25 DIAGNOSIS — Z0000 Encounter for general adult medical examination without abnormal findings: Secondary | ICD-10-CM | POA: Diagnosis not present

## 2018-06-25 DIAGNOSIS — R21 Rash and other nonspecific skin eruption: Secondary | ICD-10-CM | POA: Diagnosis not present

## 2018-06-25 DIAGNOSIS — D692 Other nonthrombocytopenic purpura: Secondary | ICD-10-CM | POA: Diagnosis not present

## 2018-06-25 DIAGNOSIS — I2510 Atherosclerotic heart disease of native coronary artery without angina pectoris: Secondary | ICD-10-CM | POA: Diagnosis not present

## 2018-06-25 DIAGNOSIS — D591 Other autoimmune hemolytic anemias: Secondary | ICD-10-CM | POA: Diagnosis not present

## 2018-06-25 DIAGNOSIS — I480 Paroxysmal atrial fibrillation: Secondary | ICD-10-CM | POA: Diagnosis not present

## 2018-06-27 ENCOUNTER — Encounter: Payer: Self-pay | Admitting: Nurse Practitioner

## 2018-06-27 ENCOUNTER — Other Ambulatory Visit: Payer: Self-pay | Admitting: Nurse Practitioner

## 2018-06-27 ENCOUNTER — Inpatient Hospital Stay: Payer: Medicare Other | Attending: Nurse Practitioner | Admitting: Nurse Practitioner

## 2018-06-27 ENCOUNTER — Inpatient Hospital Stay: Payer: Medicare Other

## 2018-06-27 VITALS — BP 176/71 | HR 87 | Temp 97.7°F | Resp 18 | Ht 63.0 in | Wt 139.1 lb

## 2018-06-27 DIAGNOSIS — R030 Elevated blood-pressure reading, without diagnosis of hypertension: Secondary | ICD-10-CM | POA: Insufficient documentation

## 2018-06-27 DIAGNOSIS — E785 Hyperlipidemia, unspecified: Secondary | ICD-10-CM

## 2018-06-27 DIAGNOSIS — D591 Autoimmune hemolytic anemia, unspecified: Secondary | ICD-10-CM

## 2018-06-27 DIAGNOSIS — Z952 Presence of prosthetic heart valve: Secondary | ICD-10-CM | POA: Insufficient documentation

## 2018-06-27 DIAGNOSIS — Z7952 Long term (current) use of systemic steroids: Secondary | ICD-10-CM | POA: Diagnosis not present

## 2018-06-27 DIAGNOSIS — Z79899 Other long term (current) drug therapy: Secondary | ICD-10-CM | POA: Insufficient documentation

## 2018-06-27 DIAGNOSIS — E039 Hypothyroidism, unspecified: Secondary | ICD-10-CM

## 2018-06-27 DIAGNOSIS — I2510 Atherosclerotic heart disease of native coronary artery without angina pectoris: Secondary | ICD-10-CM | POA: Insufficient documentation

## 2018-06-27 DIAGNOSIS — Z7901 Long term (current) use of anticoagulants: Secondary | ICD-10-CM | POA: Diagnosis not present

## 2018-06-27 DIAGNOSIS — R0609 Other forms of dyspnea: Secondary | ICD-10-CM | POA: Diagnosis not present

## 2018-06-27 LAB — COMPREHENSIVE METABOLIC PANEL
ALT: 30 U/L (ref 0–44)
AST: 24 U/L (ref 15–41)
Albumin: 4.3 g/dL (ref 3.5–5.0)
Alkaline Phosphatase: 41 U/L (ref 38–126)
Anion gap: 9 (ref 5–15)
BUN: 19 mg/dL (ref 8–23)
CO2: 29 mmol/L (ref 22–32)
Calcium: 9.4 mg/dL (ref 8.9–10.3)
Chloride: 89 mmol/L — ABNORMAL LOW (ref 98–111)
Creatinine, Ser: 0.75 mg/dL (ref 0.44–1.00)
GFR calc Af Amer: >60 mL/min (ref >60)
GFR calc non Af Amer: >60 mL/min (ref >60)
Glucose, Bld: 113 mg/dL — ABNORMAL HIGH (ref 70–99)
Potassium: 4.6 mmol/L (ref 3.5–5.1)
Sodium: 127 mmol/L — ABNORMAL LOW (ref 135–145)
Total Bilirubin: 1.6 mg/dL — ABNORMAL HIGH (ref 0.3–1.2)
Total Protein: 6.4 g/dL — ABNORMAL LOW (ref 6.5–8.1)

## 2018-06-27 LAB — CBC WITH DIFFERENTIAL (CANCER CENTER ONLY)
Basophils Absolute: 0 10*3/uL (ref 0.0–0.1)
Basophils Relative: 0 %
Eosinophils Absolute: 0 10*3/uL (ref 0.0–0.5)
Eosinophils Relative: 0 %
HCT: 37.9 % (ref 34.8–46.6)
Hemoglobin: 13.4 g/dL (ref 11.6–15.9)
Lymphocytes Relative: 5 %
Lymphs Abs: 0.6 10*3/uL — ABNORMAL LOW (ref 0.9–3.3)
MCH: 34.3 pg — ABNORMAL HIGH (ref 25.1–34.0)
MCHC: 35.4 g/dL (ref 31.5–36.0)
MCV: 96.9 fL (ref 79.5–101.0)
Monocytes Absolute: 0.5 10*3/uL (ref 0.1–0.9)
Monocytes Relative: 4 %
Neutro Abs: 9.2 10*3/uL — ABNORMAL HIGH (ref 1.5–6.5)
Neutrophils Relative %: 91 %
Platelet Count: 149 10*3/uL (ref 145–400)
RBC: 3.91 MIL/uL (ref 3.70–5.45)
RDW: 15.2 % — ABNORMAL HIGH (ref 11.2–14.5)
WBC Count: 10.2 10*3/uL (ref 3.9–10.3)

## 2018-06-27 LAB — RETICULOCYTES
RBC.: 3.96 MIL/uL (ref 3.87–5.11)
Retic Count, Absolute: 249.5 10*3/uL — ABNORMAL HIGH (ref 19.0–186.0)
Retic Ct Pct: 6.3 % — ABNORMAL HIGH (ref 0.4–3.1)

## 2018-06-27 NOTE — Progress Notes (Signed)
  [place] OFFICE PROGRESS NOTE   Diagnosis: Autoimmune hemolytic anemia  INTERVAL HISTORY:   Carol [name] returns as scheduled.  Prednisone was tapered to 20 mg daily beginning 06/13/2018.  She notes less difficulty sleeping coinciding with the decreased dose of prednisone.  She intermittently feels "jittery".  She reports a good appetite on the steroids.  No mouth discomfort.  No leg swelling.  She recently hit the right lower leg causing a skin tear.  Objective:  Vital signs in last 24 hours:  Blood pressure (!) 176/71, pulse 87, temperature 97.7 F (36.5 C), temperature source Oral, resp. rate 18, height 5\' 3"  (1.6 m), weight 139 lb 1.6 oz (63.1 kg), SpO2 96 %.    HEENT: No thrush or ulcers. Resp: Lungs clear bilaterally. Cardio: Regular rate and rhythm. GI: Abdomen soft and nontender.  No hepatospleno megaly. Vascular: No leg edema.  Skin: Skin tear/ecchymosis right lower leg.   Lab Results:  Lab Results  Component Value Date   WBC 10.2 06/27/2018   HGB 13.4 06/27/2018   HCT 37.9 06/27/2018   MCV 96.9 06/27/2018   PLT 149 06/27/2018   NEUTROABS 9.2 (H) 06/27/2018    Imaging:  No results found.  Medications: I have reviewed the patient's current medications.  Assessment/Plan: 1. Autoimmune hemolytic anemia, status post 2 units of packed red blood cells 05/14/2018, prednisone 05/15/2018, Solu-Medrol started 05/16/2018;prednisone 60 mg daily beginning 05/19/2018  Prednisone taper to 40 mg daily beginning 05/23/2018  Prednisone taper to 30 mg daily beginning 05/30/2018  Prednisone taper to 20 mg daily beginning 06/13/2018  Prednisone taper to 15 mg daily beginning 06/28/2018  2. History of coronary artery disease  3. Status post aortic valve replacement, maintained on Coumadin  4. Hyperlipidemia  5. Hypothyroidism    Disposition: Carol [name] appears stable.  Hemoglobin is higher in the normal range.  She will taper prednisone to 15 mg  daily beginning 06/28/2018.  She will return for lab and follow-up in 3 weeks.  She will contact the office in the interim with any problems.  Plan reviewed with Dr. Benay Spice.    [name] ANP/GNP-BC   06/27/2018  10:32 AM

## 2018-07-03 ENCOUNTER — Telehealth: Payer: Self-pay | Admitting: Oncology

## 2018-07-03 NOTE — Telephone Encounter (Signed)
Returned 7/10 VM from [place].  She needed to r/s 7/29 PM appt to 7/30 AM due to transportation/  Appt moved

## 2018-07-09 DIAGNOSIS — M6281 Muscle weakness (generalized): Secondary | ICD-10-CM | POA: Diagnosis not present

## 2018-07-09 DIAGNOSIS — R2681 Unsteadiness on feet: Secondary | ICD-10-CM | POA: Diagnosis not present

## 2018-07-09 DIAGNOSIS — D649 Anemia, unspecified: Secondary | ICD-10-CM | POA: Diagnosis not present

## 2018-07-16 DIAGNOSIS — R2681 Unsteadiness on feet: Secondary | ICD-10-CM | POA: Diagnosis not present

## 2018-07-16 DIAGNOSIS — M6281 Muscle weakness (generalized): Secondary | ICD-10-CM | POA: Diagnosis not present

## 2018-07-16 DIAGNOSIS — D649 Anemia, unspecified: Secondary | ICD-10-CM | POA: Diagnosis not present

## 2018-07-21 ENCOUNTER — Ambulatory Visit: Payer: No Typology Code available for payment source | Admitting: Nurse Practitioner

## 2018-07-21 ENCOUNTER — Other Ambulatory Visit: Payer: No Typology Code available for payment source

## 2018-07-21 DIAGNOSIS — I4891 Unspecified atrial fibrillation: Secondary | ICD-10-CM | POA: Diagnosis not present

## 2018-07-21 DIAGNOSIS — Z7901 Long term (current) use of anticoagulants: Secondary | ICD-10-CM | POA: Diagnosis not present

## 2018-07-22 ENCOUNTER — Inpatient Hospital Stay: Payer: Medicare Other

## 2018-07-22 ENCOUNTER — Encounter: Payer: Self-pay | Admitting: Nurse Practitioner

## 2018-07-22 ENCOUNTER — Inpatient Hospital Stay (HOSPITAL_BASED_OUTPATIENT_CLINIC_OR_DEPARTMENT_OTHER): Payer: Medicare Other | Admitting: Nurse Practitioner

## 2018-07-22 VITALS — BP 171/85 | HR 72 | Temp 97.9°F | Resp 17 | Ht 63.0 in | Wt 143.5 lb

## 2018-07-22 DIAGNOSIS — I2510 Atherosclerotic heart disease of native coronary artery without angina pectoris: Secondary | ICD-10-CM

## 2018-07-22 DIAGNOSIS — Z7952 Long term (current) use of systemic steroids: Secondary | ICD-10-CM

## 2018-07-22 DIAGNOSIS — E785 Hyperlipidemia, unspecified: Secondary | ICD-10-CM

## 2018-07-22 DIAGNOSIS — D591 Autoimmune hemolytic anemia, unspecified: Secondary | ICD-10-CM

## 2018-07-22 DIAGNOSIS — Z79899 Other long term (current) drug therapy: Secondary | ICD-10-CM

## 2018-07-22 DIAGNOSIS — R030 Elevated blood-pressure reading, without diagnosis of hypertension: Secondary | ICD-10-CM

## 2018-07-22 DIAGNOSIS — E039 Hypothyroidism, unspecified: Secondary | ICD-10-CM

## 2018-07-22 DIAGNOSIS — Z952 Presence of prosthetic heart valve: Secondary | ICD-10-CM | POA: Diagnosis not present

## 2018-07-22 DIAGNOSIS — R0609 Other forms of dyspnea: Secondary | ICD-10-CM | POA: Diagnosis not present

## 2018-07-22 DIAGNOSIS — Z7901 Long term (current) use of anticoagulants: Secondary | ICD-10-CM

## 2018-07-22 LAB — CBC WITH DIFFERENTIAL (CANCER CENTER ONLY)
Basophils Absolute: 0 10*3/uL (ref 0.0–0.1)
Basophils Absolute: 0 10*3/uL (ref 0.0–0.1)
Basophils Relative: 0 %
Basophils Relative: 0 %
Eosinophils Absolute: 0 10*3/uL (ref 0.0–0.5)
Eosinophils Absolute: 0 10*3/uL (ref 0.0–0.5)
Eosinophils Relative: 0 %
Eosinophils Relative: 0 %
HCT: 41.1 % (ref 34.8–46.6)
Hemoglobin: 14.3 g/dL (ref 11.6–15.9)
Lymphocytes Relative: 6 %
Lymphocytes Relative: 6 %
Lymphs Abs: 0.7 10*3/uL — ABNORMAL LOW (ref 0.9–3.3)
Lymphs Abs: 0.7 10*3/uL — ABNORMAL LOW (ref 0.9–3.3)
MCH: 34 pg (ref 25.1–34.0)
MCHC: 34.8 g/dL (ref 31.5–36.0)
MCV: 97.6 fL (ref 79.5–101.0)
Monocytes Absolute: 0.5 10*3/uL (ref 0.1–0.9)
Monocytes Absolute: 0.5 10*3/uL (ref 0.1–0.9)
Monocytes Relative: 4 %
Monocytes Relative: 4 %
Neutro Abs: 10 10*3/uL — ABNORMAL HIGH (ref 1.5–6.5)
Neutro Abs: 10 10*3/uL — ABNORMAL HIGH (ref 1.5–6.5)
Neutrophils Relative %: 90 %
Neutrophils Relative %: 89 %
Platelet Count: 168 10*3/uL (ref 145–400)
RBC: 4.21 MIL/uL (ref 3.70–5.45)
RDW: 15.2 % — ABNORMAL HIGH (ref 11.2–14.5)
WBC Count: 11.2 10*3/uL — ABNORMAL HIGH (ref 3.9–10.3)

## 2018-07-22 LAB — CMP (CANCER CENTER ONLY)
ALT: 37 U/L (ref 0–44)
AST: 28 U/L (ref 15–41)
Albumin: 4.6 g/dL (ref 3.5–5.0)
Alkaline Phosphatase: 58 U/L (ref 38–126)
Anion gap: 8 (ref 5–15)
BUN: 23 mg/dL (ref 8–23)
CO2: 30 mmol/L (ref 22–32)
Calcium: 9.9 mg/dL (ref 8.9–10.3)
Chloride: 92 mmol/L — ABNORMAL LOW (ref 98–111)
Creatinine: 0.88 mg/dL (ref 0.44–1.00)
GFR, Est AFR Am: >60 mL/min (ref >60)
GFR, Estimated: 57 mL/min — ABNORMAL LOW (ref >60)
Glucose, Bld: 101 mg/dL — ABNORMAL HIGH (ref 70–99)
Potassium: 4.6 mmol/L (ref 3.5–5.1)
Sodium: 130 mmol/L — ABNORMAL LOW (ref 135–145)
Total Bilirubin: 1.1 mg/dL (ref 0.3–1.2)
Total Protein: 6.8 g/dL (ref 6.5–8.1)

## 2018-07-22 LAB — RETICULOCYTES
RBC.: 4.21 MIL/uL (ref 3.70–5.45)
Retic Count, Absolute: 256.8 10*3/uL — ABNORMAL HIGH (ref 33.7–90.7)
Retic Ct Pct: 6.1 % — ABNORMAL HIGH (ref 0.7–2.1)

## 2018-07-22 LAB — SAVE SMEAR

## 2018-07-22 NOTE — Progress Notes (Addendum)
  [place] OFFICE PROGRESS NOTE   Diagnosis: Autoimmune hemolytic anemia  INTERVAL HISTORY:   Carol [name] returns as scheduled.  Prednisone was tapered to 15 mg daily beginning 06/28/2018.  She describes her energy level as "okay", slowly improving.  She continues to have a good appetite.  She is gaining weight.  She has not noticed any leg edema.  No mouth discomfort.  The traumatic ulcer at the right lower leg has healed.  She reports a new ulcer at the left lower leg as a result of striking her leg on a dining room chair.  She continues to have difficulty sleeping.  She has stable mild dyspnea on exertion.  Objective:  Vital signs in last 24 hours:  Blood pressure (!) 171/85, pulse 72, temperature 97.9 F (36.6 C), temperature source Oral, resp. rate 17, height 5\' 3"  (1.6 m), weight 143 lb 8 oz (65.1 kg), SpO2 98 %.    HEENT: No thrush. Resp: Lungs clear bilaterally. Cardio: Irregular.GI: Abdomen soft and nontender.  No hepatosplenomegaly. Vascular: No leg edema.  Skin: Ulceration at the left pretibial region, does not appear infected.   Lab Results:  Lab Results  Component Value Date   WBC DUPLICATE 94/85/4627   HGB DUPLICATE 03/50/0938   HCT DUPLICATE 18/29/9371   MCV DUPLICATE 69/67/8938   PLT DUPLICATE 10/09/5101   NEUTROABS 10.0 (H) 07/22/2018    Imaging:  No results found.  Medications: I have reviewed the patient's current medications.  Assessment/Plan: 1. Autoimmune hemolytic anemia, status post 2 units of packed red blood cells 05/14/2018, prednisone 05/15/2018, Solu-Medrol started 05/16/2018;prednisone 60 mg daily beginning 05/19/2018  Prednisone taper to 40 mg daily beginning 05/23/2018  Prednisone taper to 30 mg daily beginning 05/30/2018  Prednisone taper to 20 mg daily beginning 06/13/2018  Prednisone taper to 15 mg daily beginning 06/28/2018  Prednisone taper to 10 mg daily beginning 07/23/2018  2. History of coronary artery  disease  3. Status post aortic valve replacement, maintained on Coumadin  4. Hyperlipidemia  5. Hypothyroidism    Disposition: Ms. Uppal is stable from a hematologic standpoint.  The hemoglobin remains in normal range.  She will taper prednisone to 10 mg daily beginning 07/23/2018.  She will return for lab and follow-up in 3 weeks.  She will contact the office in the interim with any problems.  We discussed the elevated blood pressure.  She reports normal readings at the nursing facility.  Patient seen with Dr. Benay Spice.    [name] ANP/GNP-BC   07/22/2018  11:34 AM  This was a shared visit with [name].  The hemoglobin continues to improve on prednisone.  We tapered the prednisone today.  Julieanne Manson, MD

## 2018-07-23 DIAGNOSIS — R2681 Unsteadiness on feet: Secondary | ICD-10-CM | POA: Diagnosis not present

## 2018-07-23 DIAGNOSIS — D649 Anemia, unspecified: Secondary | ICD-10-CM | POA: Diagnosis not present

## 2018-07-23 DIAGNOSIS — M6281 Muscle weakness (generalized): Secondary | ICD-10-CM | POA: Diagnosis not present

## 2018-07-28 ENCOUNTER — Encounter (INDEPENDENT_AMBULATORY_CARE_PROVIDER_SITE_OTHER): Payer: Medicare Other | Admitting: Ophthalmology

## 2018-07-31 DIAGNOSIS — M6281 Muscle weakness (generalized): Secondary | ICD-10-CM | POA: Diagnosis not present

## 2018-07-31 DIAGNOSIS — D649 Anemia, unspecified: Secondary | ICD-10-CM | POA: Diagnosis not present

## 2018-07-31 DIAGNOSIS — R2681 Unsteadiness on feet: Secondary | ICD-10-CM | POA: Diagnosis not present

## 2018-08-01 ENCOUNTER — Encounter (INDEPENDENT_AMBULATORY_CARE_PROVIDER_SITE_OTHER): Payer: Medicare Other | Admitting: Ophthalmology

## 2018-08-01 DIAGNOSIS — I10 Essential (primary) hypertension: Secondary | ICD-10-CM | POA: Diagnosis not present

## 2018-08-01 DIAGNOSIS — H34812 Central retinal vein occlusion, left eye, with macular edema: Secondary | ICD-10-CM | POA: Diagnosis not present

## 2018-08-01 DIAGNOSIS — H3411 Central retinal artery occlusion, right eye: Secondary | ICD-10-CM | POA: Diagnosis not present

## 2018-08-01 DIAGNOSIS — H35033 Hypertensive retinopathy, bilateral: Secondary | ICD-10-CM | POA: Diagnosis not present

## 2018-08-01 DIAGNOSIS — H43813 Vitreous degeneration, bilateral: Secondary | ICD-10-CM | POA: Diagnosis not present

## 2018-08-04 DIAGNOSIS — Z23 Encounter for immunization: Secondary | ICD-10-CM | POA: Diagnosis not present

## 2018-08-06 DIAGNOSIS — M6281 Muscle weakness (generalized): Secondary | ICD-10-CM | POA: Diagnosis not present

## 2018-08-06 DIAGNOSIS — D649 Anemia, unspecified: Secondary | ICD-10-CM | POA: Diagnosis not present

## 2018-08-06 DIAGNOSIS — R2681 Unsteadiness on feet: Secondary | ICD-10-CM | POA: Diagnosis not present

## 2018-08-08 DIAGNOSIS — R2681 Unsteadiness on feet: Secondary | ICD-10-CM | POA: Diagnosis not present

## 2018-08-08 DIAGNOSIS — D649 Anemia, unspecified: Secondary | ICD-10-CM | POA: Diagnosis not present

## 2018-08-08 DIAGNOSIS — M6281 Muscle weakness (generalized): Secondary | ICD-10-CM | POA: Diagnosis not present

## 2018-08-11 DIAGNOSIS — I4891 Unspecified atrial fibrillation: Secondary | ICD-10-CM | POA: Diagnosis not present

## 2018-08-11 DIAGNOSIS — Z7901 Long term (current) use of anticoagulants: Secondary | ICD-10-CM | POA: Diagnosis not present

## 2018-08-12 ENCOUNTER — Telehealth: Payer: Self-pay

## 2018-08-12 ENCOUNTER — Inpatient Hospital Stay: Payer: Medicare Other | Attending: Nurse Practitioner | Admitting: Oncology

## 2018-08-12 ENCOUNTER — Inpatient Hospital Stay: Payer: Medicare Other

## 2018-08-12 VITALS — BP 167/67 | HR 99 | Temp 97.5°F | Resp 18 | Wt 146.2 lb

## 2018-08-12 DIAGNOSIS — E785 Hyperlipidemia, unspecified: Secondary | ICD-10-CM | POA: Insufficient documentation

## 2018-08-12 DIAGNOSIS — D591 Autoimmune hemolytic anemia, unspecified: Secondary | ICD-10-CM

## 2018-08-12 DIAGNOSIS — Z7901 Long term (current) use of anticoagulants: Secondary | ICD-10-CM

## 2018-08-12 DIAGNOSIS — Z7952 Long term (current) use of systemic steroids: Secondary | ICD-10-CM | POA: Insufficient documentation

## 2018-08-12 DIAGNOSIS — R5381 Other malaise: Secondary | ICD-10-CM | POA: Insufficient documentation

## 2018-08-12 DIAGNOSIS — E039 Hypothyroidism, unspecified: Secondary | ICD-10-CM

## 2018-08-12 DIAGNOSIS — Z952 Presence of prosthetic heart valve: Secondary | ICD-10-CM

## 2018-08-12 DIAGNOSIS — I2510 Atherosclerotic heart disease of native coronary artery without angina pectoris: Secondary | ICD-10-CM | POA: Diagnosis not present

## 2018-08-12 DIAGNOSIS — L988 Other specified disorders of the skin and subcutaneous tissue: Secondary | ICD-10-CM | POA: Insufficient documentation

## 2018-08-12 DIAGNOSIS — Z79899 Other long term (current) drug therapy: Secondary | ICD-10-CM | POA: Diagnosis not present

## 2018-08-12 DIAGNOSIS — R011 Cardiac murmur, unspecified: Secondary | ICD-10-CM | POA: Insufficient documentation

## 2018-08-12 DIAGNOSIS — R635 Abnormal weight gain: Secondary | ICD-10-CM | POA: Insufficient documentation

## 2018-08-12 LAB — RETICULOCYTES
RBC.: 4.1 MIL/uL (ref 3.70–5.45)
Retic Count, Absolute: 213.2 10*3/uL — ABNORMAL HIGH (ref 33.7–90.7)
Retic Ct Pct: 5.2 % — ABNORMAL HIGH (ref 0.7–2.1)

## 2018-08-12 LAB — CMP (CANCER CENTER ONLY)
ALT: 28 U/L (ref 0–44)
AST: 24 U/L (ref 15–41)
Albumin: 4.1 g/dL (ref 3.5–5.0)
Alkaline Phosphatase: 48 U/L (ref 38–126)
Anion gap: 7 (ref 5–15)
BUN: 19 mg/dL (ref 8–23)
CO2: 27 mmol/L (ref 22–32)
Calcium: 9.1 mg/dL (ref 8.9–10.3)
Chloride: 97 mmol/L — ABNORMAL LOW (ref 98–111)
Creatinine: 0.86 mg/dL (ref 0.44–1.00)
GFR, Est AFR Am: >60 mL/min (ref >60)
GFR, Est Non Af Am: 59 mL/min — ABNORMAL LOW (ref >60)
Glucose, Bld: 106 mg/dL — ABNORMAL HIGH (ref 70–99)
Potassium: 4.5 mmol/L (ref 3.5–5.1)
Sodium: 131 mmol/L — ABNORMAL LOW (ref 135–145)
Total Bilirubin: 0.9 mg/dL (ref 0.3–1.2)
Total Protein: 6.2 g/dL — ABNORMAL LOW (ref 6.5–8.1)

## 2018-08-12 LAB — CBC WITH DIFFERENTIAL (CANCER CENTER ONLY)
Basophils Absolute: 0 10*3/uL (ref 0.0–0.1)
Basophils Relative: 0 %
Eosinophils Absolute: 0 10*3/uL (ref 0.0–0.5)
Eosinophils Relative: 0 %
HCT: 39.4 % (ref 34.8–46.6)
Hemoglobin: 13.4 g/dL (ref 11.6–15.9)
Lymphocytes Relative: 9 %
Lymphs Abs: 1 10*3/uL (ref 0.9–3.3)
MCH: 32.7 pg (ref 25.1–34.0)
MCHC: 34 g/dL (ref 31.5–36.0)
MCV: 96.1 fL (ref 79.5–101.0)
Monocytes Absolute: 0.8 10*3/uL (ref 0.1–0.9)
Monocytes Relative: 8 %
Neutro Abs: 8.7 10*3/uL — ABNORMAL HIGH (ref 1.5–6.5)
Neutrophils Relative %: 83 %
Platelet Count: 176 10*3/uL (ref 145–400)
RBC: 4.1 MIL/uL (ref 3.70–5.45)
RDW: 14.8 % — ABNORMAL HIGH (ref 11.2–14.5)
WBC Count: 10.6 10*3/uL — ABNORMAL HIGH (ref 3.9–10.3)

## 2018-08-12 LAB — SAVE SMEAR

## 2018-08-12 NOTE — Progress Notes (Addendum)
  [place] OFFICE PROGRESS NOTE   Diagnosis: Autoimmune hemolytic anemia  INTERVAL HISTORY:   Carol [name] returns for a scheduled visit.  She continues prednisone at a dose of 10 mg daily.  She reports developing malaise after receiving a zoster vaccine last week.  She has noted weight gain since starting prednisone.  Objective:  Vital signs in last 24 hours:  There were no vitals taken for this visit.    HEENT: No thrush, mild cushingoid change at the face Resp: Lungs clear bilaterally Cardio: Irregular, 2/6 systolic murmur GI: No hepatosplenomegaly Vascular: No leg edema  Skin: Healing abrasions at the lower leg bilaterally with chronic discoloration at the lower legs   Lab Results:  Lab Results  Component Value Date   WBC 10.6 (H) 08/12/2018   HGB 13.4 08/12/2018   HCT 39.4 08/12/2018   MCV 96.1 08/12/2018   PLT 176 08/12/2018   NEUTROABS 8.7 (H) 08/12/2018  Reticulocyte count 213  CMP  Lab Results  Component Value Date   NA 131 (L) 08/12/2018   K 4.5 08/12/2018   CL 97 (L) 08/12/2018   CO2 27 08/12/2018   GLUCOSE 106 (H) 08/12/2018   BUN 19 08/12/2018   CREATININE 0.86 08/12/2018   CALCIUM 9.1 08/12/2018   PROT 6.2 (L) 08/12/2018   ALBUMIN 4.1 08/12/2018   AST 24 08/12/2018   ALT 28 08/12/2018   ALKPHOS 48 08/12/2018   BILITOT 0.9 08/12/2018   GFRNONAA 59 (L) 08/12/2018   GFRAA >60 08/12/2018     Medications: I have reviewed the patient's current medications.   Assessment/Plan: 1. Autoimmune hemolytic anemia, status post 2 units of packed red blood cells 05/14/2018, prednisone 05/15/2018, Solu-Medrol started 05/16/2018;prednisone 60 mg daily beginning 05/19/2018  Prednisone taper to 40 mg daily beginning 05/23/2018  Prednisone taper to 30 mg daily beginning 05/30/2018  Prednisone taper to 20 mg daily beginning 06/13/2018  Prednisone taper to 15 mg daily beginning 06/28/2018  Prednisone taper to 10 mg daily beginning  07/23/2018  2. History of coronary artery disease  3. Status post aortic valve replacement, maintained on Coumadin  4. Hyperlipidemia  5. Hypothyroidism   Disposition: She appears stable.  The hemoglobin is slightly lower today and she continues to mount a reticulocytosis.  She will continue prednisone at a dose of 10 mg daily.  She will return for an office and lab visit in 3 weeks.  15 minutes were spent with the patient today.  The majority of the time was used for counseling and coordination of care.  Betsy Coder, MD  08/12/2018  10:54 AM

## 2018-08-12 NOTE — Telephone Encounter (Signed)
Printed avs and calender of upcoming appointment. per 8/20 los

## 2018-08-13 DIAGNOSIS — L821 Other seborrheic keratosis: Secondary | ICD-10-CM | POA: Diagnosis not present

## 2018-08-13 DIAGNOSIS — Z85828 Personal history of other malignant neoplasm of skin: Secondary | ICD-10-CM | POA: Diagnosis not present

## 2018-08-13 DIAGNOSIS — L570 Actinic keratosis: Secondary | ICD-10-CM | POA: Diagnosis not present

## 2018-08-17 ENCOUNTER — Encounter (HOSPITAL_COMMUNITY): Payer: Self-pay | Admitting: Emergency Medicine

## 2018-08-17 ENCOUNTER — Other Ambulatory Visit: Payer: Self-pay

## 2018-08-17 ENCOUNTER — Inpatient Hospital Stay (HOSPITAL_COMMUNITY)
Admission: EM | Admit: 2018-08-17 | Discharge: 2018-08-20 | DRG: 872 | Disposition: A | Discharge status: Home / Self Care | Payer: Medicare Other | Attending: Internal Medicine | Admitting: Internal Medicine

## 2018-08-17 ENCOUNTER — Emergency Department (HOSPITAL_COMMUNITY)
Admission: EM | Admit: 2018-08-17 | Discharge: 2018-08-17 | Disposition: A | Discharge status: Home / Self Care | Payer: Medicare Other | Source: Home / Self Care | Attending: Emergency Medicine | Admitting: Emergency Medicine

## 2018-08-17 ENCOUNTER — Emergency Department (HOSPITAL_COMMUNITY): Payer: Medicare Other

## 2018-08-17 ENCOUNTER — Encounter (HOSPITAL_COMMUNITY): Payer: Self-pay

## 2018-08-17 DIAGNOSIS — G4700 Insomnia, unspecified: Secondary | ICD-10-CM | POA: Diagnosis not present

## 2018-08-17 DIAGNOSIS — D591 Other autoimmune hemolytic anemias: Secondary | ICD-10-CM | POA: Diagnosis not present

## 2018-08-17 DIAGNOSIS — R0902 Hypoxemia: Secondary | ICD-10-CM | POA: Diagnosis not present

## 2018-08-17 DIAGNOSIS — M549 Dorsalgia, unspecified: Secondary | ICD-10-CM | POA: Diagnosis present

## 2018-08-17 DIAGNOSIS — N3281 Overactive bladder: Secondary | ICD-10-CM | POA: Diagnosis not present

## 2018-08-17 DIAGNOSIS — I482 Chronic atrial fibrillation: Secondary | ICD-10-CM | POA: Diagnosis not present

## 2018-08-17 DIAGNOSIS — I5032 Chronic diastolic (congestive) heart failure: Secondary | ICD-10-CM

## 2018-08-17 DIAGNOSIS — Z91040 Latex allergy status: Secondary | ICD-10-CM

## 2018-08-17 DIAGNOSIS — Z7901 Long term (current) use of anticoagulants: Secondary | ICD-10-CM | POA: Insufficient documentation

## 2018-08-17 DIAGNOSIS — Z881 Allergy status to other antibiotic agents status: Secondary | ICD-10-CM | POA: Diagnosis not present

## 2018-08-17 DIAGNOSIS — R6883 Chills (without fever): Secondary | ICD-10-CM | POA: Insufficient documentation

## 2018-08-17 DIAGNOSIS — A4151 Sepsis due to Escherichia coli [E. coli]: Principal | ICD-10-CM | POA: Diagnosis present

## 2018-08-17 DIAGNOSIS — I110 Hypertensive heart disease with heart failure: Secondary | ICD-10-CM

## 2018-08-17 DIAGNOSIS — R7989 Other specified abnormal findings of blood chemistry: Secondary | ICD-10-CM

## 2018-08-17 DIAGNOSIS — Z66 Do not resuscitate: Secondary | ICD-10-CM | POA: Diagnosis present

## 2018-08-17 DIAGNOSIS — Z953 Presence of xenogenic heart valve: Secondary | ICD-10-CM | POA: Diagnosis not present

## 2018-08-17 DIAGNOSIS — G8929 Other chronic pain: Secondary | ICD-10-CM | POA: Diagnosis not present

## 2018-08-17 DIAGNOSIS — A419 Sepsis, unspecified organism: Secondary | ICD-10-CM | POA: Diagnosis not present

## 2018-08-17 DIAGNOSIS — Z885 Allergy status to narcotic agent status: Secondary | ICD-10-CM

## 2018-08-17 DIAGNOSIS — M25559 Pain in unspecified hip: Secondary | ICD-10-CM | POA: Diagnosis present

## 2018-08-17 DIAGNOSIS — E785 Hyperlipidemia, unspecified: Secondary | ICD-10-CM | POA: Diagnosis not present

## 2018-08-17 DIAGNOSIS — I2510 Atherosclerotic heart disease of native coronary artery without angina pectoris: Secondary | ICD-10-CM | POA: Diagnosis present

## 2018-08-17 DIAGNOSIS — Z79899 Other long term (current) drug therapy: Secondary | ICD-10-CM

## 2018-08-17 DIAGNOSIS — W19XXXA Unspecified fall, initial encounter: Secondary | ICD-10-CM | POA: Diagnosis not present

## 2018-08-17 DIAGNOSIS — Z7952 Long term (current) use of systemic steroids: Secondary | ICD-10-CM

## 2018-08-17 DIAGNOSIS — E039 Hypothyroidism, unspecified: Secondary | ICD-10-CM | POA: Diagnosis not present

## 2018-08-17 DIAGNOSIS — N390 Urinary tract infection, site not specified: Secondary | ICD-10-CM | POA: Diagnosis not present

## 2018-08-17 DIAGNOSIS — Z87891 Personal history of nicotine dependence: Secondary | ICD-10-CM | POA: Insufficient documentation

## 2018-08-17 DIAGNOSIS — R531 Weakness: Secondary | ICD-10-CM | POA: Diagnosis not present

## 2018-08-17 DIAGNOSIS — I4891 Unspecified atrial fibrillation: Secondary | ICD-10-CM | POA: Diagnosis not present

## 2018-08-17 DIAGNOSIS — Z91048 Other nonmedicinal substance allergy status: Secondary | ICD-10-CM | POA: Diagnosis not present

## 2018-08-17 DIAGNOSIS — R740 Nonspecific elevation of levels of transaminase and lactic acid dehydrogenase [LDH]: Secondary | ICD-10-CM | POA: Diagnosis not present

## 2018-08-17 DIAGNOSIS — I10 Essential (primary) hypertension: Secondary | ICD-10-CM | POA: Diagnosis present

## 2018-08-17 DIAGNOSIS — I213 ST elevation (STEMI) myocardial infarction of unspecified site: Secondary | ICD-10-CM | POA: Diagnosis not present

## 2018-08-17 DIAGNOSIS — R945 Abnormal results of liver function studies: Secondary | ICD-10-CM | POA: Insufficient documentation

## 2018-08-17 DIAGNOSIS — Z951 Presence of aortocoronary bypass graft: Secondary | ICD-10-CM

## 2018-08-17 DIAGNOSIS — Z888 Allergy status to other drugs, medicaments and biological substances status: Secondary | ICD-10-CM

## 2018-08-17 DIAGNOSIS — R918 Other nonspecific abnormal finding of lung field: Secondary | ICD-10-CM | POA: Diagnosis not present

## 2018-08-17 DIAGNOSIS — R410 Disorientation, unspecified: Secondary | ICD-10-CM | POA: Diagnosis not present

## 2018-08-17 DIAGNOSIS — R000 Tachycardia, unspecified: Secondary | ICD-10-CM | POA: Diagnosis not present

## 2018-08-17 DIAGNOSIS — I959 Hypotension, unspecified: Secondary | ICD-10-CM | POA: Diagnosis not present

## 2018-08-17 HISTORY — DX: Sepsis, unspecified organism: A41.9

## 2018-08-17 LAB — COMPREHENSIVE METABOLIC PANEL
ALT: 123 U/L — ABNORMAL HIGH (ref 0–44)
AST: 178 U/L — ABNORMAL HIGH (ref 15–41)
Albumin: 4.3 g/dL (ref 3.5–5.0)
Alkaline Phosphatase: 65 U/L (ref 38–126)
Anion gap: 11 (ref 5–15)
BUN: 33 mg/dL — ABNORMAL HIGH (ref 8–23)
CO2: 26 mmol/L (ref 22–32)
Calcium: 9.6 mg/dL (ref 8.9–10.3)
Chloride: 96 mmol/L — ABNORMAL LOW (ref 98–111)
Creatinine, Ser: 1.04 mg/dL — ABNORMAL HIGH (ref 0.44–1.00)
GFR calc Af Amer: 54 mL/min — ABNORMAL LOW (ref >60)
GFR calc non Af Amer: 47 mL/min — ABNORMAL LOW (ref >60)
Glucose, Bld: 110 mg/dL — ABNORMAL HIGH (ref 70–99)
Potassium: 4.2 mmol/L (ref 3.5–5.1)
Sodium: 133 mmol/L — ABNORMAL LOW (ref 135–145)
Total Bilirubin: 1.4 mg/dL — ABNORMAL HIGH (ref 0.3–1.2)
Total Protein: 6.4 g/dL — ABNORMAL LOW (ref 6.5–8.1)

## 2018-08-17 LAB — CBC WITH DIFFERENTIAL/PLATELET
Abs Immature Granulocytes: 0.1 10*3/uL (ref 0.0–0.1)
Basophils Absolute: 0 10*3/uL (ref 0.0–0.1)
Basophils Absolute: 0 10*3/uL (ref 0.0–0.1)
Basophils Relative: 0 %
Basophils Relative: 0 %
Eosinophils Absolute: 0 10*3/uL (ref 0.0–0.7)
Eosinophils Absolute: 0 10*3/uL (ref 0.0–0.7)
Eosinophils Relative: 0 %
Eosinophils Relative: 0 %
HCT: 42.1 % (ref 36.0–46.0)
HCT: 39.7 % (ref 36.0–46.0)
Hemoglobin: 14.6 g/dL (ref 12.0–15.0)
Hemoglobin: 13.3 g/dL (ref 12.0–15.0)
Immature Granulocytes: 0 %
Lymphocytes Relative: 3 %
Lymphocytes Relative: 3 %
Lymphs Abs: 0.6 10*3/uL — ABNORMAL LOW (ref 0.7–4.0)
Lymphs Abs: 0.4 10*3/uL — ABNORMAL LOW (ref 0.7–4.0)
MCH: 33.1 pg (ref 26.0–34.0)
MCH: 33.3 pg (ref 26.0–34.0)
MCHC: 34.7 g/dL (ref 30.0–36.0)
MCHC: 33.5 g/dL (ref 30.0–36.0)
MCV: 95.5 fL (ref 78.0–100.0)
MCV: 99.5 fL (ref 78.0–100.0)
Monocytes Absolute: 0.7 10*3/uL (ref 0.1–1.0)
Monocytes Absolute: 1 10*3/uL (ref 0.1–1.0)
Monocytes Relative: 4 %
Monocytes Relative: 7 %
Neutro Abs: 16.1 10*3/uL — ABNORMAL HIGH (ref 1.7–7.7)
Neutro Abs: 13.7 10*3/uL — ABNORMAL HIGH (ref 1.7–7.7)
Neutrophils Relative %: 93 %
Neutrophils Relative %: 90 %
Platelets: 210 10*3/uL (ref 150–400)
Platelets: 144 10*3/uL — ABNORMAL LOW (ref 150–400)
RBC: 4.41 MIL/uL (ref 3.87–5.11)
RBC: 3.99 MIL/uL (ref 3.87–5.11)
RDW: 14.6 % (ref 11.5–15.5)
RDW: 14.4 % (ref 11.5–15.5)
WBC: 17.4 10*3/uL — ABNORMAL HIGH (ref 4.0–10.5)
WBC: 15.2 10*3/uL — ABNORMAL HIGH (ref 4.0–10.5)

## 2018-08-17 LAB — URINALYSIS, ROUTINE W REFLEX MICROSCOPIC
Bilirubin Urine: NEGATIVE
Glucose, UA: NEGATIVE mg/dL
Hgb urine dipstick: NEGATIVE
Ketones, ur: NEGATIVE mg/dL
Leukocytes, UA: NEGATIVE
Nitrite: NEGATIVE
Protein, ur: 100 mg/dL — AB
Specific Gravity, Urine: 1.014 (ref 1.005–1.030)
pH: 7 (ref 5.0–8.0)

## 2018-08-17 LAB — BASIC METABOLIC PANEL
Anion gap: 8 (ref 5–15)
BUN: 19 mg/dL (ref 8–23)
CO2: 23 mmol/L (ref 22–32)
Calcium: 8.9 mg/dL (ref 8.9–10.3)
Chloride: 99 mmol/L (ref 98–111)
Creatinine, Ser: 0.98 mg/dL (ref 0.44–1.00)
GFR calc Af Amer: 58 mL/min — ABNORMAL LOW (ref >60)
GFR calc non Af Amer: 50 mL/min — ABNORMAL LOW (ref >60)
Glucose, Bld: 152 mg/dL — ABNORMAL HIGH (ref 70–99)
Potassium: 4 mmol/L (ref 3.5–5.1)
Sodium: 130 mmol/L — ABNORMAL LOW (ref 135–145)

## 2018-08-17 LAB — PROTIME-INR
INR: 2.77
Prothrombin Time: 29 seconds — ABNORMAL HIGH (ref 11.4–15.2)

## 2018-08-17 LAB — I-STAT CG4 LACTIC ACID, ED
Lactic Acid, Venous: 1.77 mmol/L (ref 0.5–1.9)
Lactic Acid, Venous: 1.94 mmol/L — ABNORMAL HIGH (ref 0.5–1.9)

## 2018-08-17 MED ORDER — PANTOPRAZOLE SODIUM 40 MG PO TBEC
40.0000 mg | DELAYED_RELEASE_TABLET | Freq: Every day | ORAL | Status: DC
  Administered 2018-08-17 – 2018-08-20 (×4): 40 mg via ORAL
  Filled 2018-08-17 (×4): qty 1

## 2018-08-17 MED ORDER — LEVOTHYROXINE SODIUM 88 MCG PO TABS
88.0000 ug | ORAL_TABLET | Freq: Every day | ORAL | Status: DC
  Administered 2018-08-18 – 2018-08-20 (×3): 88 ug via ORAL
  Filled 2018-08-17 (×4): qty 1

## 2018-08-17 MED ORDER — GABAPENTIN 100 MG PO CAPS
100.0000 mg | ORAL_CAPSULE | Freq: Three times a day (TID) | ORAL | Status: DC
  Administered 2018-08-17 – 2018-08-18 (×4): 100 mg via ORAL
  Filled 2018-08-17 (×4): qty 1

## 2018-08-17 MED ORDER — CEPHALEXIN 250 MG PO CAPS: 250.0000 mg | ORAL_CAPSULE | Freq: Three times a day (TID) | ORAL | 0 refills | Status: DC

## 2018-08-17 MED ORDER — WARFARIN - PHARMACIST DOSING INPATIENT
Freq: Every day | Status: DC
  Administered 2018-08-18 – 2018-08-20 (×2)

## 2018-08-17 MED ORDER — SODIUM CHLORIDE 0.9 % IV SOLN
1.0000 g | Freq: Once | INTRAVENOUS | Status: AC
  Administered 2018-08-17: 1 g via INTRAVENOUS
  Filled 2018-08-17: qty 10

## 2018-08-17 MED ORDER — SODIUM CHLORIDE 0.9 % IV SOLN
1000.0000 mL | INTRAVENOUS | Status: DC
  Administered 2018-08-17: 1000 mL via INTRAVENOUS

## 2018-08-17 MED ORDER — SODIUM CHLORIDE 0.9% FLUSH
3.0000 mL | Freq: Two times a day (BID) | INTRAVENOUS | Status: DC
  Administered 2018-08-17 – 2018-08-20 (×6): 3 mL via INTRAVENOUS

## 2018-08-17 MED ORDER — PREDNISONE 10 MG PO TABS: 10.0000 mg | ORAL_TABLET | Freq: Every day | ORAL | Status: DC

## 2018-08-17 MED ORDER — SODIUM CHLORIDE 0.9 % IV BOLUS
1000.0000 mL | Freq: Once | INTRAVENOUS | Status: AC
  Administered 2018-08-17: 1000 mL via INTRAVENOUS

## 2018-08-17 MED ORDER — SODIUM CHLORIDE 0.9 % IV SOLN
1.0000 g | INTRAVENOUS | Status: DC
  Administered 2018-08-17: 1 g via INTRAVENOUS
  Filled 2018-08-17: qty 10

## 2018-08-17 MED ORDER — SODIUM CHLORIDE 0.9 % IV SOLN: 2.0000 g | INTRAVENOUS | Status: DC

## 2018-08-17 MED ORDER — SODIUM CHLORIDE 0.9 % IV SOLN
INTRAVENOUS | Status: DC
  Administered 2018-08-17 – 2018-08-18 (×2): via INTRAVENOUS

## 2018-08-17 MED ORDER — SODIUM CHLORIDE 0.9 % IV BOLUS
500.0000 mL | Freq: Once | INTRAVENOUS | Status: AC
  Administered 2018-08-17: 500 mL via INTRAVENOUS

## 2018-08-17 MED ORDER — WARFARIN SODIUM 6 MG PO TABS
6.0000 mg | ORAL_TABLET | Freq: Once | ORAL | Status: AC
  Administered 2018-08-17: 6 mg via ORAL
  Filled 2018-08-17: qty 1

## 2018-08-17 MED ORDER — METOPROLOL TARTRATE 5 MG/5ML IV SOLN
5.0000 mg | Freq: Once | INTRAVENOUS | Status: AC
  Administered 2018-08-17: 5 mg via INTRAVENOUS
  Filled 2018-08-17: qty 5

## 2018-08-17 NOTE — H&P (Addendum)
History and Physical   AURIEL KIST FXT:[id number] DOB: [birth year] DOA: 08/17/2018  PCP: Darcus Austin, MD  Chief Complaint:  weakness  HPI: this is an 84 year old woman with medical problems including aortic valve replacement on warfarin, atrophic fibrillation, hypothyroidism, diagnosis of autoimmune hemolytic anemia in 2019 on prednisone, overactive bladder, coronary artery disease-patient believes nonobstructive, presenting from her assisted living facility with weakness.  Recent events include over the past 1-2 days has been progressively weak, associated symptoms include fevers. She was evaluated in the emergency room earlier this morning where she was empirically treated for urinary tract infection and discharged home with a course of by mouth antimicrobials. However, she became weak and was unable to get off the floor.  Her daughter came to her residence because the patient did not pick up her telephone. The patient reports chronic symptoms of urinary frequency which are unchanged from baseline, reports bruising easily. At baseline, independent in her ADLs. She requires some assistance for IADLs specifically medication management. She is involved with group activities at her community living residence, will walk with a 4 wheeled walker for prolonged distances.  Other details include, she denies any cough, no dysuria, denies nausea vomiting or chest pain. No shortness of breath. She does report some diarrhea over the past 24 hours. She is a former smoker, does not drink alcohol.  She follows with hematology oncology for her autoimmune hemolytic anemia, she's diagnosed in May of this year and was on higher doses of prednisone, but currently is on a dose of prednisone 10 mg daily.    ED Course: file since normal for fever up to 101.9 Fahrenheit, tachycardia with  Tachycardia with a rate between 101 20, respiratory rate up to 26, systolic blood pressure ranging from 93 mm Hg to 144 mm Hg most  recently. Diagnostics obtained include white count of 15.2, neutrophil predominance. Hemoglobin 13.3. BMP with a creatinine 0.98. Lactic acid 1.94.  Chest x-ray revealed stable mild cardiac megaly, coarse interstitial markings related to chronic interstitial lung disease, no acute findings. A review of prior diagnostics obtained revealed a urine culture and blood cultures are pending.  Review of Systems: A complete ROS was obtained; pertinent positives negatives are denoted in the HPI. Otherwise, all systems are negative.  Patient was given 1 g of ceftriaxone as well as put on a rate of normal saline at 1 25 mL an hour. Hospital medicine consult further management.  Past Medical History:  Diagnosis Date  . Acute blood loss anemia 05/16/2018  . Aortic stenosis   . Coronary artery disease    Cath 1/13 left main  . 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 Dyspnea   . Hyperlipidemia   . Hypertension   . Hypertensive heart disease without CHF   . Hypothyroidism   . Lumbar disc disease   . Lung nodules    Sees [facility], thought to be benign  . Obesity   . Spinal stenosis   . Villous adenoma of rectum    Social History   Socioeconomic History  . Marital status: Married    Spouse name: Not on file  . Number of children: 4  . Years of education: 54  . Highest education level: Not on file  Occupational History  . Occupation: retired Curator  . Financial resource strain: Not on file  . Food insecurity:    Worry: Not on file    Inability: Not on file  . Transportation needs:  Medical: Not on file    Non-medical: Not on file  Tobacco Use  . Smoking status: Former Smoker    Packs/day: 0.50    Years: 10.00    Pack years: 5.00    Types: Cigarettes    Last attempt to quit: 12/24/1978    Years since quitting: 39.6  . Smokeless tobacco: Never Used  Substance and Sexual Activity  . Alcohol use: No  . Drug use: No  . Sexual activity: Not Currently    Lifestyle  . Physical activity:    Days per week: Not on file    Minutes per session: Not on file  . Stress: Not on file  Relationships  . Social connections:    Talks on phone: Not on file    Gets together: Not on file    Attends religious service: Not on file    Active member of club or organization: Not on file    Attends meetings of clubs or organizations: Not on file    Relationship status: Not on file  . Intimate partner violence:    Fear of current or ex partner: Not on file    Emotionally abused: Not on file    Physically abused: Not on file    Forced sexual activity: Not on file  Other Topics Concern  . Not on file  Social History Narrative   Lives at [place] [facility]   Phone [phone]   Apt [phone]   Caffeine use: Drinks coffee- 2 cups per day at the most per pt   Family History  Problem Relation Age of Onset  . Lymphoma Sister   . Cancer Sister   . Breast cancer Sister   . Cancer Mother   . Heart attack Father   . Heart disease Father     Physical Exam: Vitals:   08/17/18 1800 08/17/18 1815 08/17/18 1900 08/17/18 1915  BP: 119/90 (!) 111/52 (!) 114/51 120/65  Pulse: (!) 112 (!) 104 99 70  Resp: 19 (!) 22 20 (!) 23  Temp:      TempSrc:      SpO2:      Weight:      Height:       General: Appears calm and comfortable, white woman, pleasant, no acute distress. ENT: Grossly normal hearing, MMM. Cardiovascular: 3/6 holosystolic murmur, well healed sternotomy scar, no LE edema. Respiratory: No respiratory distress, breathing room air, globally reduced breath sounds, no wheezing Abdomen: Soft, non-tender.  Skin: Scattered ecchymoses b/l LEs with skin tear on right anterior leg Musculoskeletal: Grossly normal tone BUE/BLE. Appropriate ROM.  Psychiatric: Grossly normal mood and affect. Neurologic: Moves all extremities in coordinated fashion. Alert, oriented to hospital, but not to year (reports that is baseline)  I have personally reviewed the  following labs, culture data, and imaging studies.  Assessment/Plan:  #Sepsis, possible, unclear source Course: presenting with non-specific symptoms of fever, leukocytosis, tachycardia, weakness in the background of chronic urinary frequency and in setting of ongoing prednisone use (currently 10 mg daily); lactic acid elevated - but not >2.  No localizing signs or symptoms. A/P: At this point in time, cannot rule out sepsis / bacteremia, benefits of antimicrobial therapy outweigh burdens at this time. Other possibilities may include viral or inflammatory disorders. Will continue with ceftriaxone (dose increased to bacteremia dosing - as cannot be ruled out as of yet). Holding anti-HTN agents given SBP < 100 mm Hg on admission.  If clinical status worsens - plan to broaden antimicrobial coverage.  Repeat lactic acid to ensure stability.  Follow up pending urine and blood cx.  500 cc bolus of NS followed by 100 cc q hr x 10 hrs for now for IVF support.  Telemetry.  #Other problems: -Mechanical AVR, CAD, HTN: INR from earlier on 8/25 was 2.77; will repeat INR and consult pharmacy for optimal VKA dosing while in-house; holding BB, ARB in setting of treatment for sepsis until clinical stability ensues -AF: continuing VKA as above -Autoimmune hemolytic anemia: normal Hb, will continue on home dose of prednisone 10 mg; initiating PPI for GI protection in setting of AC and steroid use -Hypothyroidism: continue home thyroid hormone supplement -Chronic back / hip pain: will continue gabapentin at lower dose -Insomnia: will hold benzodiazepine, as acute burdens likely outweigh benefits -Overactive bladder: holding anti-cholinergic agent for now  DVT prophylaxis: on warfarin, therapeutic at last check Code Status: DNR/Do not intubate after discussion on admission Disposition Plan: Anticipate D/C home in 2-5 days Consults called: none Admission status: admit to hospital medicine, telemetry   Cheri Rous, MD [facility] Page:[phone]  If 7PM-7AM, please contact night-coverage [email or web address] Password [email or web address]  ADDENDUM: 12:53 AM Patient noted to be febrile, tachycardic, increasing lactic acid (3.3) despite initial antimicrobial therapy and IVF support. Will broaden antimicrobial coverage to treat severe sepsis, undetermined source with cefepime & vancomycin.  Give an additional Liter of LR bolus. In event adrenal insufficiency at play - dexamethasone 5 mg IV q 12 initiated given possible adrenal suppression in setting of chronic prednisone use.  Repeat  Blood cx.  One time dose of acetaminophen.  Vilma Prader, MD

## 2018-08-17 NOTE — ED Notes (Signed)
Daughter [name]: [phone]

## 2018-08-17 NOTE — ED Notes (Signed)
Pt given water 

## 2018-08-17 NOTE — ED Provider Notes (Signed)
[facility] DEPT Provider Note   CSN: [id number] Arrival date & time: 08/17/18  0121     History   Chief Complaint Chief Complaint  Patient presents with  . Chills    HPI Carol [name] is a 84 y.o. female.  The history is provided by the patient.  She has history of hypertension, hyperlipidemia, coronary artery disease, aortic stenosis, atrial fibrillation with chronic anticoagulation with warfarin, autoimmune hemolytic anemia and comes in because of chills.  She woke up tonight and felt like she had to urinate.  There was some urinary urgency without dysuria or frequency.  She denies fever or sweats.  She denies cough.  She denies nausea or vomiting.  Chills have subsided.  Past Medical History:  Diagnosis Date  . Acute blood loss anemia 05/16/2018  . Aortic stenosis   . Coronary artery disease    Cath 1/13 left main  . 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 Dyspnea   . Hyperlipidemia   . Hypertension   . Hypertensive heart disease without CHF   . Hypothyroidism   . Lumbar disc disease   . Lung nodules    Sees Wert, thought to be benign  . Obesity   . Spinal stenosis   . Villous adenoma of rectum     Patient Active Problem List   Diagnosis Date Noted  . AIHA (autoimmune hemolytic anemia) (HCC)   . Acute blood loss anemia 05/14/2018  . Elevated bilirubin 05/14/2018  . Obstructive jaundice 05/14/2018  . 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 Long term (current) use of anticoagulants 07/14/2013  . Chronic diastolic heart failure ([place]) 01/17/2012  . Hyponatremia 01/17/2012  . Atrial fibrillation ([place])   . 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 CAD (coronary artery disease) 12/26/2011  . History of Villous adenoma of rectum   . Hypertensive heart disease without CHF   . Hyperlipidemia   . Hypothyroidism   . Lumbar degenerative disc disease   . Aortic stenosis 11/08/2011  . Personal history  of solitary pulmonary nodule 11/07/2011    Past Surgical History:  Procedure Laterality Date  . AORTIC VALVE REPLACEMENT  12/28/2011   Procedure: AORTIC VALVE REPLACEMENT (AVR);  Surgeon: Grace Isaac, MD;  Location: [facility];  Service: Open Heart Surgery;  Laterality: N/A;  . CARDIAC CATHETERIZATION  2013  . CHOLECYSTECTOMY    . CORONARY ARTERY BYPASS GRAFT  12/28/2011   Procedure: CORONARY ARTERY BYPASS GRAFTING (CABG);  Surgeon: Grace Isaac, MD;  Location: [facility];  Service: Open Heart Surgery;  Laterality: N/A;  . HERNIA REPAIR    . LEFT AND RIGHT HEART CATHETERIZATION WITH CORONARY ANGIOGRAM N/A 12/26/2011   Procedure: LEFT AND RIGHT HEART CATHETERIZATION WITH CORONARY ANGIOGRAM;  Surgeon: Jacolyn Reedy, MD;  Location: [facility] CATH LAB;  Service: Cardiovascular;  Laterality: N/A;  . LUMBAR LAMINECTOMY    . PARTIAL COLECTOMY  2008   Villous adenoma     OB History   None      Home Medications    Prior to Admission medications   Medication Sig Start Date End Date Taking? Authorizing Provider  ALPRAZolam (XANAX) 0.25 MG tablet Take 0.25 mg by mouth at bedtime. 02/04/16   [provider]  atorvastatin (LIPITOR) 20 MG tablet Take 20 mg by mouth at bedtime.      [provider]  BESIVANCE 0.6 % SUSP Place 1 drop into the left eye See admin instructions. Place 1 drop  left eye 4 times daily for 2 days after Avastin eye injections 11/23/16   [provider]  Calcium Carbonate-Vitamin D (CALCIUM 600 + D PO) Take 1 tablet by mouth every morning.     [provider]  docusate sodium (COLACE) 100 MG capsule Take 100 mg by mouth at bedtime.     [provider]  gabapentin (NEURONTIN) 100 MG capsule TAKE 2 CAPSULES BY MOUTH 3 TIMES A DAY 06/24/15   [provider]  levothyroxine (SYNTHROID, LEVOTHROID) 88 MCG tablet TAKE 1 TABLET BY MOUTH EVERY MORNING ON AN EMPTY STOMACH 06/28/15   [provider]  losartan (COZAAR) 100 MG tablet Take  100 mg by mouth daily. 06/19/15   [provider]  metoprolol tartrate (LOPRESSOR) 25 MG tablet Take 25 mg by mouth 2 (two) times daily. 03/02/16   [provider]  Multiple Vitamin (MULITIVITAMIN WITH MINERALS) TABS Take 1 tablet by mouth daily.      [provider]  oxybutynin (DITROPAN) 5 MG tablet Take 5 mg by mouth every other day.    [provider]  polyethylene glycol (MIRALAX / GLYCOLAX) packet Take 17 g by mouth daily as needed for mild constipation.     [provider]  predniSONE (DELTASONE) 10 MG tablet Take 3 tablets (30 mg total) by mouth daily with breakfast. 05/30/18   Ladell Pier, MD  Probiotic Product (PROBIOTIC PO) Take 1 capsule by mouth daily.    [provider]  warfarin (COUMADIN) 1 MG tablet Take 1-3 mg by mouth See admin instructions. Take 1 mg by mouth at bedtime on Sun/Mon/Wed/Fri/Sat and 2 mg on Tues and 3 mg on Thurs, in conjunction with one 5 mg tablet 03/14/16   [provider]  warfarin (COUMADIN) 5 MG tablet Take 5 mg by mouth at bedtime.     [provider]  amiodarone (PACERONE) 200 MG tablet Take 200 mg by mouth daily.  03/06/12  [provider]    Family History Family History  Problem Relation Age of Onset  . Lymphoma Sister   . Cancer Sister   . Breast cancer Sister   . Cancer Mother   . Heart attack Father   . Heart disease Father     Social History Social History   Tobacco Use  . Smoking status: Former Smoker    Packs/day: 0.50    Years: 10.00    Pack years: 5.00    Types: Cigarettes    Last attempt to quit: 12/24/1978    Years since quitting: 39.6  . Smokeless tobacco: Never Used  Substance Use Topics  . Alcohol use: No  . Drug use: No     Allergies   Ibuprofen; Nucynta [tapentadol]; Ace inhibitors; Doxycycline; Hydrocodone; Nsaids; Adhesive [tape]; and Latex   Review of Systems Review of Systems  All other systems reviewed and are  negative.    Physical Exam Updated Vital Signs BP (!) 110/57 (BP Location: Right Arm)   Pulse 89   Temp 99.2 F (37.3 C) (Oral)   Ht 5\' 4"  (1.626 m)   Wt 66.2 kg   SpO2 94%   BMI 25.06 kg/m   Physical Exam  Nursing note and vitals reviewed.  84 year old female, resting comfortably and in no acute distress. Vital signs are normal. Oxygen saturation is 94%, which is normal. Head is normocephalic and atraumatic. PERRLA, EOMI. Oropharynx is clear. Neck is nontender and supple without adenopathy or JVD. Back is nontender and there is  no CVA tenderness. Lungs are clear without rales, wheezes, or rhonchi. Chest is nontender. Heart has regular rate and rhythm without murmur. Abdomen is soft, flat, nontender without masses or hepatosplenomegaly and peristalsis is normoactive. Extremities have no cyanosis or edema, full range of motion is present. Skin is warm and dry without rash. Neurologic: Mental status is normal, cranial nerves are intact, there are no motor or sensory deficits.  ED Treatments / Results  Labs (all labs ordered are listed, but only abnormal results are displayed) Labs Reviewed  COMPREHENSIVE METABOLIC PANEL - Abnormal; Notable for the following components:      Result Value   Sodium 133 (*)    Chloride 96 (*)    Glucose, Bld 110 (*)    BUN 33 (*)    Creatinine, Ser 1.04 (*)    Total Protein 6.4 (*)    AST 178 (*)    ALT 123 (*)    Total Bilirubin 1.4 (*)    GFR calc non Af Amer 47 (*)    GFR calc Af Amer 54 (*)    All other components within normal limits  CBC WITH DIFFERENTIAL/PLATELET - Abnormal; Notable for the following components:   WBC 17.4 (*)    Neutro Abs 16.1 (*)    Lymphs Abs 0.6 (*)    All other components within normal limits  URINALYSIS, ROUTINE W REFLEX MICROSCOPIC - Abnormal; Notable for the following components:   Protein, ur 100 (*)    Bacteria, UA RARE (*)    All other components within normal limits  PROTIME-INR - Abnormal;  Notable for the following components:   Prothrombin Time 29.0 (*)    All other components within normal limits  CULTURE, BLOOD (ROUTINE X 2)  CULTURE, BLOOD (ROUTINE X 2)  URINE CULTURE  I-STAT CG4 LACTIC ACID, ED    EKG EKG Interpretation  Date/Time:  Sunday August 17 2018 02:02:13 EDT Ventricular Rate:  103 PR Interval:    QRS Duration: 106 QT Interval:  331 QTC Calculation: 434 R Axis:   10 Text Interpretation:  Sinus tachycardia Ventricular trigeminy Borderline prolonged PR interval Anterior infarct, old Minimal ST depression, lateral leads When compared with ECG of 01/17/2012, No significant change was found Confirmed by Delora Fuel ([id number]) on 08/17/2018 2:22:21 AM   Radiology Dg Chest 2 View  Result Date: 08/17/2018 CLINICAL DATA:  Chills EXAM: CHEST - 2 VIEW COMPARISON:  02/21/2012 FINDINGS: Postsurgical changes of the mediastinum. Evidence of prior valve prosthesis. Cardiomegaly. Mild diffuse increased interstitial opacity. Postsurgical changes in the left upper lobe. No pneumothorax or pleural effusion. Aortic atherosclerosis. IMPRESSION: 1. Mild diffuse increased interstitial opacity, possibly due to bronchial inflammation. No focal pulmonary infiltrate. 2. Mild cardiomegaly Electronically Signed   By: Donavan Foil M.D.   On: 08/17/2018 02:06    Procedures Procedures   Medications Ordered in ED Medications  0.9 %  sodium chloride infusion (0 mLs Intravenous Stopped 08/17/18 0342)  sodium chloride 0.9 % bolus 1,000 mL (has no administration in time range)  cefTRIAXone (ROCEPHIN) 1 g in sodium chloride 0.9 % 100 mL IVPB (has no administration in time range)     Initial Impression / Assessment and Plan / ED Course  I have reviewed the triage vital signs and the nursing notes.  Pertinent labs & imaging results that were available during my care of the patient were reviewed by me and considered in my medical decision making (see chart for details).  Chills, rule out  early sepsis.  Sepsis  labs are obtained.  Old records are reviewed confirming hospitalization in May for autoimmune hemolytic anemia with warm antibodies.  She is currently on prednisone 10 mg daily.  Of note, with normal vital signs and patient nontoxic in appearance, and biotics are not administered at this time.  3:43 AM WBC is elevated with left shift, but patient continues to be nontoxic in appearance.  Chest x-ray shows no evidence of pneumonia.  Urinalysis has minimally increased WBC count which may possibly indicate early urinary tract infection.  Metabolic panel does show mild elevation of transaminases and minimal elevation of bilirubin.  This will need to be followed as an outpatient.  BUN is slightly increased.  She will be given additional IV fluid and initial dose of ceftriaxone, discharged with prescription for cephalexin.  Follow-up with PCP in 1 week.  Return precautions discussed.  Final Clinical Impressions(s) / ED Diagnoses   Final diagnoses:  Urinary tract infection without hematuria, site unspecified  Elevated liver function tests  Chronic anticoagulation    ED Discharge Orders         Ordered    cephALEXin (KEFLEX) 250 MG capsule  3 times daily,   Status:  Discontinued     08/17/18 0439    cephALEXin (KEFLEX) 250 MG capsule  3 times daily     08/17/18 8786           Delora Fuel, MD 76/72/09 [phone]

## 2018-08-17 NOTE — ED Triage Notes (Signed)
Arrived via [facility] with c/o having chills, afebrile, no other c/o.

## 2018-08-17 NOTE — Significant Event (Addendum)
Rapid Response Event Note  Overview: Chills/Tachycardia  Initial Focused Assessment: While on 90M, RN asked me to assist with patient for increased HR and chills. Per RN, patient is admitted for sepsis workup. RN informed me that patient was just admitted upstairs, patient was shivering and had the chills and her HR and BP were elevated. I asked the RN to obtain new VS and that I would come see the patient as soon as I could (I was called to see another patient on 90M).   When I went in to assess patient, RN informed me that patient's current temp is 98.2 orally, current HR is the 130s, Patient's states she feels very cold, warm blankets applied and room temperature was increased. Patient is not acute distress. Skin is normo-thermic and patient is not acute distress. SBP in 190s  I did page TRH NP, orders received for Lopressor 5mg  IV   Interventions: - Initiate warming measures for comfort - If chills/shivering do not improve, check a rectal temp just to confirm that patient is not febrile. - Lopressor 5mg  IV  Plan of Care: - Check rectal temp - Reassess VS in after Lopressor administration - Rectal Temp was 102.3 - RN called MD, orders received for 1L LR, BCx, ABX, Lactic Acid is 3.1, repeat ordered for 330am, and APAP for fever.  - Repeat VS after interventions implemented  Event Summary:   at    Start Time 2315 End Time 0000  Escher Harr R

## 2018-08-17 NOTE — ED Provider Notes (Signed)
[facility] EMERGENCY DEPARTMENT Provider Note   CSN: [id number] Arrival date & time: 08/17/18  1700     History   Chief Complaint Chief Complaint  Patient presents with  . Urinary Tract Infection    HPI Carol [name] is a 84 y.o. female.  84 year old female presents complaining of increased weakness secondary to UTI that was diagnosed earlier this morning.  Patient was at her assisted living facility where she became severely weak and sat herself down on the ground.  Denies any head injury.  Has not had her prescriptions filled.  Has had slightly increased shortness of breath without cough.  No vomiting or diarrhea.  Her daughter found on the floor and she had a temperature of 103 was given a gram of Tylenol and EMS was called and patient transported here.     Past Medical History:  Diagnosis Date  . Acute blood loss anemia 05/16/2018  . Aortic stenosis   . Coronary artery disease    Cath 1/13 left main  . 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 Dyspnea   . Hyperlipidemia   . Hypertension   . Hypertensive heart disease without CHF   . Hypothyroidism   . Lumbar disc disease   . Lung nodules    Sees Wert, thought to be benign  . Obesity   . Spinal stenosis   . Villous adenoma of rectum     Patient Active Problem List   Diagnosis Date Noted  . AIHA (autoimmune hemolytic anemia) (HCC)   . Acute blood loss anemia 05/14/2018  . Elevated bilirubin 05/14/2018  . Obstructive jaundice 05/14/2018  . 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 Long term (current) use of anticoagulants 07/14/2013  . Chronic diastolic heart failure ([place]) 01/17/2012  . Hyponatremia 01/17/2012  . Atrial fibrillation ([place])   . 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 CAD (coronary artery disease) 12/26/2011  . History of Villous adenoma of rectum   . Hypertensive heart disease without CHF   . Hyperlipidemia   . Hypothyroidism   . Lumbar  degenerative disc disease   . Aortic stenosis 11/08/2011  . Personal history of solitary pulmonary nodule 11/07/2011    Past Surgical History:  Procedure Laterality Date  . AORTIC VALVE REPLACEMENT  12/28/2011   Procedure: AORTIC VALVE REPLACEMENT (AVR);  Surgeon: Grace Isaac, MD;  Location: [facility];  Service: Open Heart Surgery;  Laterality: N/A;  . CARDIAC CATHETERIZATION  2013  . CHOLECYSTECTOMY    . CORONARY ARTERY BYPASS GRAFT  12/28/2011   Procedure: CORONARY ARTERY BYPASS GRAFTING (CABG);  Surgeon: Grace Isaac, MD;  Location: [facility];  Service: Open Heart Surgery;  Laterality: N/A;  . HERNIA REPAIR    . LEFT AND RIGHT HEART CATHETERIZATION WITH CORONARY ANGIOGRAM N/A 12/26/2011   Procedure: LEFT AND RIGHT HEART CATHETERIZATION WITH CORONARY ANGIOGRAM;  Surgeon: Jacolyn Reedy, MD;  Location: [facility] CATH LAB;  Service: Cardiovascular;  Laterality: N/A;  . LUMBAR LAMINECTOMY    . PARTIAL COLECTOMY  2008   Villous adenoma     OB History   None      Home Medications    Prior to Admission medications   Medication Sig Start Date End Date Taking? Authorizing Provider  ALPRAZolam (XANAX) 0.25 MG tablet Take 0.25 mg by mouth at bedtime. 02/04/16   [provider]  atorvastatin (LIPITOR) 20 MG tablet Take 20 mg by mouth at bedtime.      [provider]  BESIVANCE 0.6 % SUSP Place 1 drop into the left eye See admin instructions. Place 1 drop left eye 4 times daily for 2 days after Avastin eye injections 11/23/16   [provider]  Calcium Carbonate-Vitamin D (CALCIUM 600 + D PO) Take 1 tablet by mouth every morning.     [provider]  cephALEXin (KEFLEX) 250 MG capsule Take 1 capsule (250 mg total) by mouth 3 (three) times daily. 1/61/09   Delora Fuel, MD  docusate sodium (COLACE) 100 MG capsule Take 100 mg by mouth at bedtime.     [provider]  gabapentin (NEURONTIN) 100 MG capsule TAKE 2 CAPSULES BY MOUTH 3 TIMES A DAY 06/24/15    [provider]  levothyroxine (SYNTHROID, LEVOTHROID) 88 MCG tablet TAKE 1 TABLET BY MOUTH EVERY MORNING ON AN EMPTY STOMACH 06/28/15   [provider]  losartan (COZAAR) 100 MG tablet Take 100 mg by mouth daily. 06/19/15   [provider]  metoprolol tartrate (LOPRESSOR) 25 MG tablet Take 25 mg by mouth 2 (two) times daily. 03/02/16   [provider]  Multiple Vitamin (MULITIVITAMIN WITH MINERALS) TABS Take 1 tablet by mouth daily.      [provider]  oxybutynin (DITROPAN) 5 MG tablet Take 5 mg by mouth every other day.    [provider]  polyethylene glycol (MIRALAX / GLYCOLAX) packet Take 17 g by mouth daily as needed for mild constipation.     [provider]  predniSONE (DELTASONE) 10 MG tablet Take 3 tablets (30 mg total) by mouth daily with breakfast. 05/30/18   Ladell Pier, MD  Probiotic Product (PROBIOTIC PO) Take 1 capsule by mouth daily.    [provider]  warfarin (COUMADIN) 1 MG tablet Take 1-3 mg by mouth See admin instructions. Take 1 mg by mouth at bedtime on Sun/Mon/Wed/Fri/Sat and 2 mg on Tues and 3 mg on Thurs, in conjunction with one 5 mg tablet 03/14/16   [provider]  warfarin (COUMADIN) 5 MG tablet Take 5 mg by mouth at bedtime.     [provider]  amiodarone (PACERONE) 200 MG tablet Take 200 mg by mouth daily.  03/06/12  [provider]    Family History Family History  Problem Relation Age of Onset  . Lymphoma Sister   . Cancer Sister   . Breast cancer Sister   . Cancer Mother   . Heart attack Father   . Heart disease Father     Social History Social History   Tobacco Use  . Smoking status: Former Smoker    Packs/day: 0.50    Years: 10.00    Pack years: 5.00    Types: Cigarettes    Last attempt to quit: 12/24/1978    Years since quitting: 39.6  . Smokeless tobacco: Never Used  Substance Use Topics  . Alcohol use: No  . Drug use: No     Allergies     Ibuprofen; Nucynta [tapentadol]; Ace inhibitors; Doxycycline; Hydrocodone; Nsaids; Adhesive [tape]; and Latex   Review of Systems Review of Systems  All other systems reviewed and are negative.    Physical Exam Updated Vital Signs BP (!) 105/48   Pulse (!) 112   Resp (!) 23   Ht 1.626 m (5\' 4" )   Wt 66.2 kg   SpO2 95%   BMI 25.06 kg/m   Physical Exam  Constitutional: She is oriented to person, place, and time. She appears well-developed and well-nourished.  Non-toxic appearance. No distress.  HENT:  Head: Normocephalic and atraumatic.  Eyes: Pupils are equal, round, and reactive to light. Conjunctivae, EOM and lids are normal.  Neck: Normal range of motion. Neck supple. No tracheal deviation present. No thyroid mass present.  Cardiovascular: Normal rate, regular rhythm and normal heart sounds. Exam reveals no gallop.  No murmur heard. Pulmonary/Chest: Effort normal and breath sounds normal. No stridor. No respiratory distress. She has no decreased breath sounds. She has no wheezes. She has no rhonchi. She has no rales.  Abdominal: Soft. Normal appearance and bowel sounds are normal. She exhibits no distension. There is no tenderness. There is no rigidity, no rebound, no guarding and no CVA tenderness.  Musculoskeletal: Normal range of motion. She exhibits no edema or tenderness.  Neurological: She is alert and oriented to person, place, and time. No cranial nerve deficit or sensory deficit. GCS eye subscore is 4. GCS verbal subscore is 5. GCS motor subscore is 6.  Skin: Skin is warm and dry. No abrasion and no rash noted.  Psychiatric: Her affect is blunt.  Nursing note and vitals reviewed.    ED Treatments / Results  Labs (all labs ordered are listed, but only abnormal results are displayed) Labs Reviewed  CBC WITH DIFFERENTIAL/PLATELET  BASIC METABOLIC PANEL  URINALYSIS, ROUTINE W REFLEX MICROSCOPIC  I-STAT CG4 LACTIC ACID, ED    EKG EKG  Interpretation  Date/Time:  Sunday August 17 2018 17:00:37 EDT Ventricular Rate:  112 PR Interval:    QRS Duration: 82 QT Interval:  317 QTC Calculation: 433 R Axis:   34 Text Interpretation:  Sinus tachycardia Ventricular bigeminy Probable left atrial enlargement Anterior infarct, old Minimal ST depression, lateral leads Confirmed by Lacretia Leigh ([id number]) on 08/17/2018 5:09:26 PM   Radiology Dg Chest 2 View  Result Date: 08/17/2018 CLINICAL DATA:  Chills EXAM: CHEST - 2 VIEW COMPARISON:  02/21/2012 FINDINGS: Postsurgical changes of the mediastinum. Evidence of prior valve prosthesis. Cardiomegaly. Mild diffuse increased interstitial opacity. Postsurgical changes in the left upper lobe. No pneumothorax or pleural effusion. Aortic atherosclerosis. IMPRESSION: 1. Mild diffuse increased interstitial opacity, possibly due to bronchial inflammation. No focal pulmonary infiltrate. 2. Mild cardiomegaly Electronically Signed   By: Donavan Foil M.D.   On: 08/17/2018 02:06    Procedures Procedures (including critical care time)  Medications Ordered in ED Medications  0.9 %  sodium chloride infusion (has no administration in time range)  cefTRIAXone (ROCEPHIN) 1 g in sodium chloride 0.9 % 100 mL IVPB (has no administration in time range)     Initial Impression / Assessment and Plan / ED Course  I have reviewed the triage vital signs and the nursing notes.  Pertinent labs & imaging results that were available during my care of the patient were reviewed by me and considered in my medical decision making (see chart for details).    Patient treated with Rocephin for known UTI.  She is mentating appropriately at this time.  Will be admitted to the hospitalist service.. Final Clinical Impressions(s) / ED Diagnoses   Final diagnoses:  None    ED Discharge Orders    None       Lacretia Leigh, MD 08/17/18 [facility]

## 2018-08-17 NOTE — ED Notes (Signed)
Bed: RU04 Expected date:  Expected time:  Means of arrival:  Comments: 84 yo F/fever, chills

## 2018-08-17 NOTE — ED Notes (Signed)
Patient transported to X-ray 

## 2018-08-17 NOTE — ED Triage Notes (Addendum)
Pt here from [facility] via [facility]. Pt was d/c'd from [facility] this am with diagnosis of UTI.  Pt fell on floor after arriving back to her facility and was on the carpet.  Pt denies LOC or hitting her head.  Daughter gave pt 1000 mg of tylenol for a 103 fever at 1600. Facility said she needed to be transported here to get an FL2 form before they could put her in their rehab facility.  Pt has no current complaints. [facility] 122/82, 119 P, RR 20, 119 CBgG, SPO2 95% RA. A&O x 4.

## 2018-08-17 NOTE — ED Notes (Signed)
Urine culture sent to lab with sample

## 2018-08-17 NOTE — Discharge Instructions (Addendum)
You had some tests of your liver which were slightly high today. Please see your doctor in one week to have those tests repeated.  Return if you start running a fever, or if new symptoms develop.

## 2018-08-17 NOTE — ED Notes (Signed)
PureWick placed on pt. 

## 2018-08-17 NOTE — Progress Notes (Signed)
ANTICOAGULATION CONSULT NOTE - Initial Consult  Pharmacy Consult for Warfarin Indication: h/o AFib,  Tissue AVR  Allergies  Allergen Reactions  . Ibuprofen Other (See Comments)    Hx of GI bleed  . Nucynta [Tapentadol] Other (See Comments)    Exact reaction not recalled, but knows she "cannot take"  . Ace Inhibitors Cough  . Doxycycline Nausea Only  . Hydrocodone Nausea Only  . Nsaids     Can only tolerate Tylenol (has a history of GI BLEEDS)  . Adhesive [Tape] Rash    Blisters (can use only paper tape)  . Latex Rash and Other (See Comments)    Paper tape only    Patient Measurements: Height: 5\' 4"  (162.6 cm) Weight: 146 lb (66.2 kg) IBW/kg (Calculated) : 54.7   Vital Signs: Temp: 98.2 F (36.8 C) (08/25 2138) Temp Source: Oral (08/25 2138) BP: 144/75 (08/25 2138) Pulse Rate: 110 (08/25 2138)  Labs: Recent Labs    08/17/18 0202 08/17/18 1731  HGB 14.6 13.3  HCT 42.1 39.7  PLT 210 144*  LABPROT 29.0*  --   INR 2.77  --   CREATININE 1.04* 0.98    Estimated Creatinine Clearance: 37.1 mL/min (by C-G formula based on SCr of 0.98 mg/dL).   Medical History: Past Medical History:  Diagnosis Date  . Acute blood loss anemia 05/16/2018  . Aortic stenosis   . Coronary artery disease    Cath 1/13 left main  . 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 Dyspnea   . Hyperlipidemia   . Hypertension   . Hypertensive heart disease without CHF   . Hypothyroidism   . Lumbar disc disease   . Lung nodules    Sees Wert, thought to be benign  . Obesity   . Spinal stenosis   . Villous adenoma of rectum     Medications:  Medications Prior to Admission  Medication Sig Dispense Refill Last Dose  . ALPRAZolam (XANAX) 0.25 MG tablet Take 0.25 mg by mouth at bedtime.  1 08/16/2018 at Unknown time  . atorvastatin (LIPITOR) 20 MG tablet Take 20 mg by mouth at bedtime.     08/16/2018 at 2130  . BESIVANCE 0.6 % SUSP Place 1 drop into the left eye See admin  instructions. Place 1 drop left eye 4 times daily for 2 days after Avastin eye injections  11 Past Month at Unknown time  . Calcium Carbonate-Vitamin D (CALCIUM 600 + D PO) Take 1 tablet by mouth every morning.    08/16/2018 at Unknown time  . cephALEXin (KEFLEX) 250 MG capsule Take 1 capsule (250 mg total) by mouth 3 (three) times daily. 30 capsule 0 08/17/2018 at Unknown time  . docusate sodium (COLACE) 100 MG capsule Take 100 mg by mouth at bedtime.    08/16/2018 at Unknown time  . gabapentin (NEURONTIN) 100 MG capsule Take 200 mg by mouth 3 (three) times daily.   0 08/17/2018 at Unknown time  . levothyroxine (SYNTHROID, LEVOTHROID) 88 MCG tablet Take 88 mcg by mouth daily before breakfast.   2 08/16/2018 at Unknown time  . losartan (COZAAR) 100 MG tablet Take 100 mg by mouth daily.  4 08/16/2018 at Unknown time  . metoprolol tartrate (LOPRESSOR) 25 MG tablet Take 25 mg by mouth 2 (two) times daily.  4 08/16/2018 at 2130  . Multiple Vitamin (MULITIVITAMIN WITH MINERALS) TABS Take 1 tablet by mouth daily.     08/16/2018 at Unknown time  . oxybutynin (DITROPAN) 5 MG tablet  Take 5 mg by mouth every other day.   Past Week at Unknown time  . polyethylene glycol (MIRALAX / GLYCOLAX) packet Take 17 g by mouth daily as needed for mild constipation.    unk  . predniSONE (DELTASONE) 5 MG tablet Take 10 mg by mouth daily with breakfast.   08/17/2018 at Unknown time  . Probiotic Product (PROBIOTIC PO) Take 1 capsule by mouth daily.   08/16/2018 at Unknown time  . warfarin (COUMADIN) 1 MG tablet Take 1-2 mg by mouth See admin instructions. Take 1 mg by mouth at bedtime on Sun/Mon/Wed/Fri/Sat and 2 mg on Tues and Thurs, in conjunction with one 5 mg tablet.  0 08/16/2018 at 2130  . warfarin (COUMADIN) 5 MG tablet Take 5 mg by mouth at bedtime.    08/16/2018 at 2130  . predniSONE (DELTASONE) 10 MG tablet Take 3 tablets (30 mg total) by mouth daily with breakfast. (Patient not taking: Reported on 08/17/2018) 90 tablet 0 Not  Taking at Unknown time    Assessment: 84 y.o female on Warfarin PTA for h/o Afib.  Has h/o tissue AVR (1/4 2013). INR 2.77 , therapeutic this AM.  Goal INR 2-3  Pltc 210>144, H/H wnl . No bleeding noted   PTA warfarin dose 6 mg daily except 7 mg qTue & Thurs., last taken 8/24 at 21:30 (takes at bedtime)  <has 1mg  tabs and 5mg  tabs PTA>   Goal of Therapy:  INR 2-3 Monitor platelets by anticoagulation protocol: Yes   Plan:  Warfarin 6 mg tonight (usual home dose)  Daily INR   Thank you for allowing pharmacy to be part of this patients care team.  Nicole Cella, RPh Clinical Pharmacist Pager: [phone] Please check AMION for all [facility] phone numbers After 10:00 PM, call [place] [phone] 08/17/2018,10:18 PM

## 2018-08-17 NOTE — ED Notes (Signed)
Pt given [place] sandwich and cranberry juice.

## 2018-08-18 DIAGNOSIS — N390 Urinary tract infection, site not specified: Secondary | ICD-10-CM

## 2018-08-18 DIAGNOSIS — R740 Nonspecific elevation of levels of transaminase and lactic acid dehydrogenase [LDH]: Secondary | ICD-10-CM

## 2018-08-18 LAB — CBC
HCT: 33.7 % — ABNORMAL LOW (ref 36.0–46.0)
Hemoglobin: 11.2 g/dL — ABNORMAL LOW (ref 12.0–15.0)
MCH: 32.4 pg (ref 26.0–34.0)
MCHC: 33.2 g/dL (ref 30.0–36.0)
MCV: 97.4 fL (ref 78.0–100.0)
Platelets: 109 10*3/uL — ABNORMAL LOW (ref 150–400)
RBC: 3.46 MIL/uL — ABNORMAL LOW (ref 3.87–5.11)
RDW: 14.2 % (ref 11.5–15.5)
WBC: 9.6 10*3/uL (ref 4.0–10.5)

## 2018-08-18 LAB — COMPREHENSIVE METABOLIC PANEL
ALT: 70 U/L — ABNORMAL HIGH (ref 0–44)
AST: 48 U/L — ABNORMAL HIGH (ref 15–41)
Albumin: 2.9 g/dL — ABNORMAL LOW (ref 3.5–5.0)
Alkaline Phosphatase: 43 U/L (ref 38–126)
Anion gap: 8 (ref 5–15)
BUN: 13 mg/dL (ref 8–23)
CO2: 22 mmol/L (ref 22–32)
Calcium: 7.9 mg/dL — ABNORMAL LOW (ref 8.9–10.3)
Chloride: 100 mmol/L (ref 98–111)
Creatinine, Ser: 0.8 mg/dL (ref 0.44–1.00)
GFR calc Af Amer: >60 mL/min (ref >60)
GFR calc non Af Amer: >60 mL/min (ref >60)
Glucose, Bld: 122 mg/dL — ABNORMAL HIGH (ref 70–99)
Potassium: 3.6 mmol/L (ref 3.5–5.1)
Sodium: 130 mmol/L — ABNORMAL LOW (ref 135–145)
Total Bilirubin: 1.2 mg/dL (ref 0.3–1.2)
Total Protein: 4.7 g/dL — ABNORMAL LOW (ref 6.5–8.1)

## 2018-08-18 LAB — TSH: TSH: 1.429 u[IU]/mL (ref 0.350–4.500)

## 2018-08-18 LAB — LACTIC ACID, PLASMA
Lactic Acid, Venous: 3.4 mmol/L (ref 0.5–1.9)
Lactic Acid, Venous: 1.1 mmol/L (ref 0.5–1.9)

## 2018-08-18 LAB — PROTIME-INR
INR: 3.27
Prothrombin Time: 33.1 seconds — ABNORMAL HIGH (ref 11.4–15.2)

## 2018-08-18 MED ORDER — SODIUM CHLORIDE 0.9 % IV SOLN
2.0000 g | Freq: Once | INTRAVENOUS | Status: AC
  Administered 2018-08-18: 2 g via INTRAVENOUS
  Filled 2018-08-18: qty 2

## 2018-08-18 MED ORDER — PREDNISONE 10 MG PO TABS
10.0000 mg | ORAL_TABLET | Freq: Every day | ORAL | Status: DC
  Administered 2018-08-19 – 2018-08-20 (×2): 10 mg via ORAL
  Filled 2018-08-18 (×2): qty 1

## 2018-08-18 MED ORDER — SODIUM CHLORIDE 0.9 % IV SOLN
1.0000 g | INTRAVENOUS | Status: DC
  Administered 2018-08-18: 1 g via INTRAVENOUS
  Filled 2018-08-18 (×2): qty 10

## 2018-08-18 MED ORDER — ACETAMINOPHEN 325 MG PO TABS
650.0000 mg | ORAL_TABLET | Freq: Four times a day (QID) | ORAL | Status: DC | PRN
  Administered 2018-08-18 – 2018-08-19 (×2): 650 mg via ORAL
  Filled 2018-08-18 (×2): qty 2

## 2018-08-18 MED ORDER — VANCOMYCIN HCL IN DEXTROSE 1-5 GM/200ML-% IV SOLN: 1000.0000 mg | INTRAVENOUS | Status: DC

## 2018-08-18 MED ORDER — WARFARIN SODIUM 2 MG PO TABS
2.0000 mg | ORAL_TABLET | Freq: Once | ORAL | Status: AC
  Administered 2018-08-18: 2 mg via ORAL
  Filled 2018-08-18: qty 1

## 2018-08-18 MED ORDER — SODIUM CHLORIDE 0.9 % IV SOLN: 1.0000 g | INTRAVENOUS | Status: DC

## 2018-08-18 MED ORDER — LACTATED RINGERS IV BOLUS
1000.0000 mL | Freq: Once | INTRAVENOUS | Status: AC
  Administered 2018-08-18: 1000 mL via INTRAVENOUS

## 2018-08-18 MED ORDER — VANCOMYCIN HCL 10 G IV SOLR
1250.0000 mg | Freq: Once | INTRAVENOUS | Status: AC
  Administered 2018-08-18: 1250 mg via INTRAVENOUS
  Filled 2018-08-18: qty 1250

## 2018-08-18 MED ORDER — DEXAMETHASONE SODIUM PHOSPHATE 4 MG/ML IJ SOLN
4.0000 mg | Freq: Two times a day (BID) | INTRAMUSCULAR | Status: DC
  Administered 2018-08-18 (×2): 4 mg via INTRAVENOUS
  Filled 2018-08-18 (×2): qty 1

## 2018-08-18 MED ORDER — ACETAMINOPHEN 500 MG PO TABS
1000.0000 mg | ORAL_TABLET | Freq: Once | ORAL | Status: AC
  Administered 2018-08-18: 1000 mg via ORAL
  Filled 2018-08-18: qty 2

## 2018-08-18 NOTE — Progress Notes (Signed)
[facility] TEAM 1 - Stepdown/ICU TEAM  LINSI HUMANN  ZOX:[id number] DOB: 08/20/1928 DOA: 08/17/2018 PCP: Darcus Austin, MD    Brief Narrative:  84yo F s/p aortic valve replacement on warfarin, atrial fibrillation, hypothyroidism, autoimmune hemolytic anemia on prednisone, overactive bladder, and CAD who presented from her assisted living facility with generalized weakness and fever.  Significant Events: 8/25 admit via ED from ALF  Subjective: Pt is awake and alert.  She reports she feels much better, but still feels weak all over.  She is worried about being "sent home too soon."  She denies cp, n/v, or abdom pain.    Assessment & Plan:  Severe Sepsis due to UTI Lactate has normalized - UA mildly abnormal w/ 60K E coli on culture - suspect this is the etiology of her sepsis as it fits the clinical picture - narrow abx and await sensitivities on culture   S/p mechanical AoVR on chronic warfarin  INR at goal   Mild transamanitis  C/w mild shock liver - f/u in AM   Autoimmune hemolytic anemia Hgb stable at this time - reduce steroids back to maintenance dose  Hypothyroidism TSH at goal at 1.429  CAD asymptomatic presently   HTN BP currently well controlled  DVT prophylaxis: warfarin  Code Status: DNR - NO CODE Family Communication: spoke w/ daughter at length   Disposition Plan: tele bed - begin PT/OT - possible d/c 48-72hrs   Consultants:  none  Antimicrobials:  Ceftriaxone 8/24 > Cefepime 8/25 Vanc 8/25  Objective: Blood pressure (!) 153/80, pulse 91, temperature 97.7 F (36.5 C), temperature source Oral, resp. rate 20, height 5\' 4"  (1.626 m), weight 66.2 kg, SpO2 98 %.  Intake/Output Summary (Last 24 hours) at 08/18/2018 1446 Last data filed at 08/18/2018 0900 Gross per 24 hour  Intake 3336 ml  Output 1700 ml  Net 1636 ml   Filed Weights   08/17/18 1705  Weight: 66.2 kg    Examination: General: No acute respiratory distress Lungs: Clear to  auscultation bilaterally without wheezes or crackles Cardiovascular: Regular rate and rhythm without murmur gallop or rub normal S1 and S2 Abdomen: Nontender, nondistended, soft, bowel sounds positive, no rebound, no ascites, no appreciable mass Extremities: No significant cyanosis, clubbing, or edema bilateral lower extremities  CBC: Recent Labs  Lab 08/12/18 0927 08/17/18 0202 08/17/18 1731 08/18/18 0229  WBC 10.6* 17.4* 15.2* 9.6  NEUTROABS 8.7* 16.1* 13.7*  --   HGB 13.4 14.6 13.3 11.2*  HCT 39.4 42.1 39.7 33.7*  MCV 96.1 95.5 99.5 97.4  PLT 176 210 144* 811*   Basic Metabolic Panel: Recent Labs  Lab 08/17/18 0202 08/17/18 1731 08/18/18 0229  NA 133* 130* 130*  K 4.2 4.0 3.6  CL 96* 99 100  CO2 26 23 22   GLUCOSE 110* 152* 122*  BUN 33* 19 13  CREATININE 1.04* 0.98 0.80  CALCIUM 9.6 8.9 7.9*   GFR: Estimated Creatinine Clearance: 45.5 mL/min (by C-G formula based on SCr of 0.8 mg/dL).  Liver Function Tests: Recent Labs  Lab 08/12/18 0927 08/17/18 0202 08/18/18 0229  AST 24 178* 48*  ALT 28 123* 70*  ALKPHOS 48 65 43  BILITOT 0.9 1.4* 1.2  PROT 6.2* 6.4* 4.7*  ALBUMIN 4.1 4.3 2.9*    Coagulation Profile: Recent Labs  Lab 08/17/18 0202 08/18/18 0229  INR 2.77 3.27    HbA1C: Hgb A1c MFr Bld  Date/Time Value Ref Range Status  12/27/2011 06:18 PM 4.8 <5.7 % Final  Comment:    (NOTE)                                                                       According to the ADA Clinical Practice Recommendations for 2011, when HbA1c is used as a screening test:  >=6.5%   Diagnostic of Diabetes Mellitus           (if abnormal result is confirmed) 5.7-6.4%   Increased risk of developing Diabetes Mellitus References:Diagnosis and Classification of Diabetes Mellitus,Diabetes ZOXW,9604,54(UJWJX 1):S62-S69 and Standards of Medical Care in         Diabetes - 2011,Diabetes BJYN,8295,62 (Suppl 1):S11-S61.     Recent Results (from the past 240 hour(s))    Blood Culture (routine x 2)     Status: None (Preliminary result)   Collection Time: 08/17/18  2:02 AM  Result Value Ref Range Status   Specimen Description   Final    BLOOD LEFT ANTECUBITAL Performed at [facility] [address] [place], [place] [address]    Special Requests   Final    BOTTLES DRAWN AEROBIC AND ANAEROBIC Blood Culture adequate volume Performed at [facility] [address], [place], [place] [address]    Culture   Final    NO GROWTH 1 DAY Performed at [facility], [place] [address], [place], [place] [address]    Report Status PENDING  Incomplete  Blood Culture (routine x 2)     Status: None (Preliminary result)   Collection Time: 08/17/18  2:02 AM  Result Value Ref Range Status   Specimen Description   Final    BLOOD RIGHT ANTECUBITAL Performed at [facility] [address], [place], [place] [address]    Special Requests   Final    BOTTLES DRAWN AEROBIC AND ANAEROBIC Blood Culture adequate volume Performed at [facility] [address] [place], [place] [address]    Culture   Final    NO GROWTH 1 DAY Performed at [facility], [place] [address], [place], [place] [address]    Report Status PENDING  Incomplete  Urine culture     Status: Abnormal (Preliminary result)   Collection Time: 08/17/18  2:02 AM  Result Value Ref Range Status   Specimen Description   Final    URINE, RANDOM Performed at [facility] [address], [place],  [address]    Special Requests   Final    NONE Performed at [facility], [place] [address] [place], [place] [address]    Culture 60,000 COLONIES/mL ESCHERICHIA COLI (A)  Final   Report Status PENDING  Incomplete     Scheduled Meds: . dexamethasone  4 mg Intravenous Q12H  . gabapentin  100 mg Oral TID  . levothyroxine  88 mcg Oral QAC breakfast  . pantoprazole  40 mg Oral Daily  . sodium  chloride flush  3 mL Intravenous Q12H  . warfarin  2 mg Oral ONCE-1800  . Warfarin - Pharmacist Dosing Inpatient   Does not apply q1800     LOS: 1 day   Cherene Altes, MD [facility] Office  [phone] Pager - Text Page per Amion  If 7PM-7AM, please  contact night-coverage per Amion 08/18/2018, 2:46 PM

## 2018-08-18 NOTE — Progress Notes (Signed)
Patient was ambulated to bathroom, HR up to 170's-180's,sustained. BP 173/110 HR 176 O2 sat 100% on 2L/[place]. MD on call text paged. Will continue to monitor. Jasmane Brockway, Wonda Cheng, Therapist, sports

## 2018-08-18 NOTE — Progress Notes (Signed)
Pharmacy Antibiotic Note  Carol [name] is a 84 y.o. female admitted on 08/17/2018 with sepsis.  Pharmacy has been consulted for vancomycin and cefepime dosing.  Plan: Vancomycin 1250 mg IV x 1 then 1gm IV q24 hours Cefepime 2gm IV x 1 then 1gm IV q24 hours F/u renal function, cultures and clinical course  Height: 5\' 4"  (162.6 cm) Weight: 146 lb (66.2 kg) IBW/kg (Calculated) : 54.7  Temp (24hrs), Avg:100 F (37.8 C), Min:98.2 F (36.8 C), Max:102.3 F (39.1 C)  Recent Labs  Lab 08/12/18 0927 08/17/18 0202 08/17/18 0209 08/17/18 1731 08/17/18 1742 08/17/18 2242  WBC 10.6* 17.4*  --  15.2*  --   --   CREATININE 0.86 1.04*  --  0.98  --   --   LATICACIDVEN  --   --  1.77  --  1.94* 3.4*    Estimated Creatinine Clearance: 37.1 mL/min (by C-G formula based on SCr of 0.98 mg/dL).    Allergies  Allergen Reactions  . Ibuprofen Other (See Comments)    Hx of GI bleed  . Nucynta [Tapentadol] Other (See Comments)    Exact reaction not recalled, but knows she "cannot take"  . Ace Inhibitors Cough  . Doxycycline Nausea Only  . Hydrocodone Nausea Only  . Nsaids     Can only tolerate Tylenol (has a history of GI BLEEDS)  . Adhesive [Tape] Rash    Blisters (can use only paper tape)  . Latex Rash and Other (See Comments)    Paper tape only     Thank you for allowing pharmacy to be a part of this patient's care.  Excell Seltzer Poteet 08/18/2018 12:51 AM

## 2018-08-18 NOTE — Progress Notes (Signed)
ANTICOAGULATION CONSULT NOTE - Initial Consult  Pharmacy Consult for Warfarin Indication: h/o AFib,  Tissue AVR  Allergies  Allergen Reactions  . Ibuprofen Other (See Comments)    Hx of GI bleed  . Nucynta [Tapentadol] Other (See Comments)    Exact reaction not recalled, but knows she "cannot take"  . Ace Inhibitors Cough  . Doxycycline Nausea Only  . Hydrocodone Nausea Only  . Nsaids     Can only tolerate Tylenol (has a history of GI BLEEDS)  . Adhesive [Tape] Rash    Blisters (can use only paper tape)  . Latex Rash and Other (See Comments)    Paper tape only    Patient Measurements: Height: 5\' 4"  (162.6 cm) Weight: 146 lb (66.2 kg) IBW/kg (Calculated) : 54.7   Vital Signs: Temp: 99.8 F (37.7 C) (08/26 0402) Temp Source: Rectal (08/26 0402) BP: 145/62 (08/26 0402) Pulse Rate: 105 (08/26 0402)  Labs: Recent Labs    08/17/18 0202 08/17/18 1731 08/18/18 0229  HGB 14.6 13.3 11.2*  HCT 42.1 39.7 33.7*  PLT 210 144* 109*  LABPROT 29.0*  --  33.1*  INR 2.77  --  3.27  CREATININE 1.04* 0.98 0.80    Estimated Creatinine Clearance: 45.5 mL/min (by C-G formula based on SCr of 0.8 mg/dL).   Medical History: Past Medical History:  Diagnosis Date  . Acute blood loss anemia 05/16/2018  . Aortic stenosis   . Coronary artery disease    Cath 1/13 left main  . 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 Dyspnea   . Hyperlipidemia   . Hypertension   . Hypertensive heart disease without CHF   . Hypothyroidism   . Lumbar disc disease   . Lung nodules    Sees Wert, thought to be benign  . Obesity   . Spinal stenosis   . Villous adenoma of rectum     Medications:  Medications Prior to Admission  Medication Sig Dispense Refill Last Dose  . ALPRAZolam (XANAX) 0.25 MG tablet Take 0.25 mg by mouth at bedtime.  1 08/16/2018 at Unknown time  . atorvastatin (LIPITOR) 20 MG tablet Take 20 mg by mouth at bedtime.     08/16/2018 at 2130  . BESIVANCE 0.6 %  SUSP Place 1 drop into the left eye See admin instructions. Place 1 drop left eye 4 times daily for 2 days after Avastin eye injections  11 Past Month at Unknown time  . Calcium Carbonate-Vitamin D (CALCIUM 600 + D PO) Take 1 tablet by mouth every morning.    08/16/2018 at Unknown time  . cephALEXin (KEFLEX) 250 MG capsule Take 1 capsule (250 mg total) by mouth 3 (three) times daily. 30 capsule 0 08/17/2018 at Unknown time  . docusate sodium (COLACE) 100 MG capsule Take 100 mg by mouth at bedtime.    08/16/2018 at Unknown time  . gabapentin (NEURONTIN) 100 MG capsule Take 200 mg by mouth 3 (three) times daily.   0 08/17/2018 at Unknown time  . levothyroxine (SYNTHROID, LEVOTHROID) 88 MCG tablet Take 88 mcg by mouth daily before breakfast.   2 08/16/2018 at Unknown time  . losartan (COZAAR) 100 MG tablet Take 100 mg by mouth daily.  4 08/16/2018 at Unknown time  . metoprolol tartrate (LOPRESSOR) 25 MG tablet Take 25 mg by mouth 2 (two) times daily.  4 08/16/2018 at 2130  . Multiple Vitamin (MULITIVITAMIN WITH MINERALS) TABS Take 1 tablet by mouth daily.     08/16/2018 at Unknown  time  . oxybutynin (DITROPAN) 5 MG tablet Take 5 mg by mouth every other day.   Past Week at Unknown time  . polyethylene glycol (MIRALAX / GLYCOLAX) packet Take 17 g by mouth daily as needed for mild constipation.    unk  . predniSONE (DELTASONE) 5 MG tablet Take 10 mg by mouth daily with breakfast.   08/17/2018 at Unknown time  . Probiotic Product (PROBIOTIC PO) Take 1 capsule by mouth daily.   08/16/2018 at Unknown time  . warfarin (COUMADIN) 1 MG tablet Take 1-2 mg by mouth See admin instructions. Take 1 mg by mouth at bedtime on Sun/Mon/Wed/Fri/Sat and 2 mg on Tues and Thurs, in conjunction with one 5 mg tablet.  0 08/16/2018 at 2130  . warfarin (COUMADIN) 5 MG tablet Take 5 mg by mouth at bedtime.    08/16/2018 at 2130  . predniSONE (DELTASONE) 10 MG tablet Take 3 tablets (30 mg total) by mouth daily with breakfast. (Patient not  taking: Reported on 08/17/2018) 90 tablet 0 Not Taking at Unknown time    Assessment: 84 y.o female on Warfarin PTA for h/o Afib.  Has h/o tissue AVR (1/4 2013). INR 3.27 , Supratherapeutic this AM.  Goal INR 2-3  Pltc 210>144>109, H/H wnl . No bleeding noted   PTA warfarin dose 6 mg daily except 7 mg qTue & Thurs., last taken 8/24 at 21:30 (takes at bedtime)  <has 1mg  tabs and 5mg  tabs PTA>  Patient started on abx for sepsis which may be affecting the patient's INR.   Goal of Therapy:  INR 2-3 Monitor platelets by anticoagulation protocol: Yes   Plan:  Warfarin 2 mg tonight  Daily INR    Rhodie Cienfuegos A. Levada Dy, PharmD, [place] Pager: [phone] Please utilize Amion for appropriate phone number to reach the unit pharmacist ([place])   08/18/2018,7:51 AM

## 2018-08-19 LAB — CBC
HCT: 35.4 % — ABNORMAL LOW (ref 36.0–46.0)
Hemoglobin: 12.1 g/dL (ref 12.0–15.0)
MCH: 32.8 pg (ref 26.0–34.0)
MCHC: 34.2 g/dL (ref 30.0–36.0)
MCV: 95.9 fL (ref 78.0–100.0)
Platelets: 123 10*3/uL — ABNORMAL LOW (ref 150–400)
RBC: 3.69 MIL/uL — ABNORMAL LOW (ref 3.87–5.11)
RDW: 13.6 % (ref 11.5–15.5)
WBC: 8.6 10*3/uL (ref 4.0–10.5)

## 2018-08-19 LAB — COMPREHENSIVE METABOLIC PANEL
ALT: 54 U/L — ABNORMAL HIGH (ref 0–44)
AST: 32 U/L (ref 15–41)
Albumin: 3.1 g/dL — ABNORMAL LOW (ref 3.5–5.0)
Alkaline Phosphatase: 47 U/L (ref 38–126)
Anion gap: 8 (ref 5–15)
BUN: 15 mg/dL (ref 8–23)
CO2: 24 mmol/L (ref 22–32)
Calcium: 8.6 mg/dL — ABNORMAL LOW (ref 8.9–10.3)
Chloride: 100 mmol/L (ref 98–111)
Creatinine, Ser: 0.81 mg/dL (ref 0.44–1.00)
GFR calc Af Amer: >60 mL/min (ref >60)
GFR calc non Af Amer: >60 mL/min (ref >60)
Glucose, Bld: 137 mg/dL — ABNORMAL HIGH (ref 70–99)
Potassium: 3.7 mmol/L (ref 3.5–5.1)
Sodium: 132 mmol/L — ABNORMAL LOW (ref 135–145)
Total Bilirubin: 0.8 mg/dL (ref 0.3–1.2)
Total Protein: 5.4 g/dL — ABNORMAL LOW (ref 6.5–8.1)

## 2018-08-19 LAB — URINE CULTURE: Culture: 60000 — AB

## 2018-08-19 LAB — MAGNESIUM: Magnesium: 1.8 mg/dL (ref 1.7–2.4)

## 2018-08-19 LAB — PROTIME-INR
INR: 3.72
Prothrombin Time: 36.6 seconds — ABNORMAL HIGH (ref 11.4–15.2)

## 2018-08-19 MED ORDER — SODIUM CHLORIDE 0.9 % IV SOLN
2.0000 g | INTRAVENOUS | Status: DC
  Administered 2018-08-19: 2 g via INTRAVENOUS
  Filled 2018-08-19 (×2): qty 20

## 2018-08-19 MED ORDER — METOPROLOL TARTRATE 25 MG PO TABS
25.0000 mg | ORAL_TABLET | Freq: Two times a day (BID) | ORAL | Status: DC
  Administered 2018-08-19: 25 mg via ORAL
  Filled 2018-08-19: qty 1

## 2018-08-19 MED ORDER — CALCIUM CARBONATE-VITAMIN D 500-200 MG-UNIT PO TABS
1.0000 | ORAL_TABLET | Freq: Every day | ORAL | Status: DC
  Administered 2018-08-19 – 2018-08-20 (×2): 1 via ORAL
  Filled 2018-08-19 (×2): qty 1

## 2018-08-19 MED ORDER — METOPROLOL TARTRATE 50 MG PO TABS
50.0000 mg | ORAL_TABLET | Freq: Four times a day (QID) | ORAL | Status: DC
  Administered 2018-08-19 – 2018-08-20 (×3): 50 mg via ORAL
  Filled 2018-08-19 (×3): qty 1

## 2018-08-19 MED ORDER — PROBIOTIC PO CAPS: ORAL_CAPSULE | Freq: Every day | ORAL | Status: DC

## 2018-08-19 MED ORDER — SACCHAROMYCES BOULARDII 250 MG PO CAPS
250.0000 mg | ORAL_CAPSULE | Freq: Every day | ORAL | Status: DC
  Administered 2018-08-19 – 2018-08-20 (×2): 250 mg via ORAL
  Filled 2018-08-19 (×2): qty 1

## 2018-08-19 MED ORDER — METOPROLOL TARTRATE 5 MG/5ML IV SOLN
2.5000 mg | INTRAVENOUS | Status: DC | PRN
  Administered 2018-08-19: 2.5 mg via INTRAVENOUS
  Filled 2018-08-19: qty 5

## 2018-08-19 MED ORDER — POLYETHYLENE GLYCOL 3350 17 G PO PACK: 17.0000 g | PACK | Freq: Every day | ORAL | Status: DC | PRN

## 2018-08-19 MED ORDER — GABAPENTIN 100 MG PO CAPS
200.0000 mg | ORAL_CAPSULE | Freq: Three times a day (TID) | ORAL | Status: DC
  Administered 2018-08-19 – 2018-08-20 (×5): 200 mg via ORAL
  Filled 2018-08-19 (×5): qty 2

## 2018-08-19 MED ORDER — DOCUSATE SODIUM 100 MG PO CAPS
100.0000 mg | ORAL_CAPSULE | Freq: Every day | ORAL | Status: DC
  Administered 2018-08-19: 100 mg via ORAL
  Filled 2018-08-19: qty 1

## 2018-08-19 MED ORDER — CALCIUM CARB-CHOLECALCIFEROL 600-200 MG-UNIT PO TABS: ORAL_TABLET | ORAL | Status: DC

## 2018-08-19 MED ORDER — OXYBUTYNIN CHLORIDE 5 MG PO TABS
5.0000 mg | ORAL_TABLET | ORAL | Status: DC
  Administered 2018-08-19: 5 mg via ORAL
  Filled 2018-08-19: qty 1

## 2018-08-19 MED ORDER — ALPRAZOLAM 0.25 MG PO TABS
0.2500 mg | ORAL_TABLET | Freq: Every day | ORAL | Status: DC
  Administered 2018-08-19: 0.25 mg via ORAL
  Filled 2018-08-19: qty 1

## 2018-08-19 MED ORDER — ADULT MULTIVITAMIN W/MINERALS CH
1.0000 | ORAL_TABLET | Freq: Every day | ORAL | Status: DC
  Administered 2018-08-19 – 2018-08-20 (×2): 1 via ORAL
  Filled 2018-08-19 (×2): qty 1

## 2018-08-19 MED ORDER — ATORVASTATIN CALCIUM 20 MG PO TABS
20.0000 mg | ORAL_TABLET | Freq: Every day | ORAL | Status: DC
  Administered 2018-08-19: 20 mg via ORAL
  Filled 2018-08-19: qty 1

## 2018-08-19 MED ORDER — LOSARTAN POTASSIUM 50 MG PO TABS
100.0000 mg | ORAL_TABLET | Freq: Every day | ORAL | Status: DC
  Administered 2018-08-19 – 2018-08-20 (×2): 100 mg via ORAL
  Filled 2018-08-19 (×2): qty 2

## 2018-08-19 NOTE — Progress Notes (Signed)
Pt BP 191/116 hr 140's irregular, sustained.  Paged Dr. Herbert Moors advised gave metorprolol 25 mg po and cozaar 100 mg po.  Pt states she is upset due to being awakened during the night and advised bp and hr.

## 2018-08-19 NOTE — Evaluation (Signed)
Physical Therapy Evaluation Patient Details Name: Carol [name] MRN: [id number] DOB: [birth year] Today's Date: 08/19/2018   History of Present Illness  84 y.o. female admitted on 08/17/18 for weakness, dx with sepsis with (+) e coli UTI.  Pt also having runs of A-fib with RVR (rates >160) during her stay requiring HR control meds.  Pt with other significant PMH of spinal stenosis/lumbar disc disease s/p lumbar laminectomy, CABG, aortic valve replacement, hypertensive heart disease, HTN, CAD, aortic stenosis, and anemia.  Clinical Impression  Pt is a bit unsteady on her feet, but it may be the different walker adding to that instability (she uses a 4 wheeled walker and we used a regular RW today).  She was active with home therapy at her independent apartment and it would be beneficial to resume the HHPT and add HHOT (per OT recommendations--see OT note).   PT to follow acutely for deficits listed below.      Follow Up Recommendations Home health PT;Other (comment)(resume HHPT, and add HHOT)    Equipment Recommendations  None recommended by PT    Recommendations for Other Services   NA    Precautions / Restrictions Precautions Precautions: Fall;Other (comment) Precaution Comments: monitor HR      Mobility  Bed Mobility               General bed mobility comments: Pt was OOB in the recliner chair.   Transfers Overall transfer level: Needs assistance Equipment used: Rolling walker (2 wheeled) Transfers: Sit to/from Stand Sit to Stand: Min guard         General transfer comment: Min guard assist from recliner chair and BSC in bathroom.   Ambulation/Gait Ambulation/Gait assistance: Min guard Gait Distance (Feet): 65 Feet Assistive device: Rolling walker (2 wheeled) Gait Pattern/deviations: Step-through pattern;Staggering left;Staggering right     General Gait Details: Pt with very mildly staggering gait pattern, mostly when attempting to turn with a RW that is  different than her own.  HR max observed 122 and O2 sats 92% lowest observed on RA during gait.           Balance Overall balance assessment: Needs assistance Sitting-balance support: Feet supported;No upper extremity supported Sitting balance-Leahy Scale: Good     Standing balance support: Bilateral upper extremity supported;Single extremity supported;No upper extremity supported Standing balance-Leahy Scale: Fair Standing balance comment: close supervision needed for tasks at the sink and pt usually had hands on support for balance during hand washing.                              Pertinent Vitals/Pain Pain Assessment: No/denies pain    Home Living Family/patient expects to be discharged to:: Private residence Living Arrangements: Alone Available Help at Discharge: Personal care attendant;Available PRN/intermittently Type of Home: Independent living facility Home Access: Level entry     Home Layout: One level Home Equipment: Walker - 4 wheels;Cane - single point      Prior Function Level of Independence: Independent with assistive device(s);Needs assistance   Gait / Transfers Assistance Needed: uses rollator for in room and walks to dining hall.  She reports she does not like a regular walker because it doesn't turn as smoothly, but she has a hard time getting the rollator into the car  ADL's / Homemaking Assistance Needed: assist from housekeeper and personal aide that comes several days per week   Comments: poor vision, see OT note for details  Hand Dominance        Extremity/Trunk Assessment   Upper Extremity Assessment Upper Extremity Assessment: Defer to OT evaluation    Lower Extremity Assessment Lower Extremity Assessment: Generalized weakness    Cervical / Trunk Assessment Cervical / Trunk Assessment: Kyphotic(with some signs of scoliotic changes- S curve)  Communication   Communication: No difficulties  Cognition Arousal/Alertness:  Awake/alert Behavior During Therapy: WFL for tasks assessed/performed Overall Cognitive Status: Within Functional Limits for tasks assessed                                 General Comments: not specifically tested, but conversation normal              Assessment/Plan    PT Assessment Patient needs continued PT services  PT Problem List Decreased strength;Decreased activity tolerance;Decreased balance;Decreased mobility;Decreased knowledge of use of DME       PT Treatment Interventions DME instruction;Gait training;Functional mobility training;Therapeutic activities;Therapeutic exercise;Patient/family education;Balance training    PT Goals (Current goals can be found in the Care Plan section)  Acute Rehab PT Goals Patient Stated Goal: to get her HR under control and go home PT Goal Formulation: With patient Time For Goal Achievement: 09/02/18 Potential to Achieve Goals: Good    Frequency Min 3X/week           AM-PAC PT "6 Clicks" Daily Activity  Outcome Measure Difficulty turning over in bed (including adjusting bedclothes, sheets and blankets)?: A Lot Difficulty moving from lying on back to sitting on the side of the bed? : A Lot Difficulty sitting down on and standing up from a chair with arms (e.g., wheelchair, bedside commode, etc,.)?: Unable Help needed moving to and from a bed to chair (including a wheelchair)?: A Little Help needed walking in hospital room?: A Little Help needed climbing 3-5 steps with a railing? : A Lot 6 Click Score: 13    End of Session Equipment Utilized During Treatment: Gait belt Activity Tolerance: Patient limited by fatigue Patient left: in chair;with call bell/phone within reach;with nursing/sitter in room Nurse Communication: Mobility status PT Visit Diagnosis: Muscle weakness (generalized) (M62.81);Difficulty in walking, not elsewhere classified (R26.2);Unsteadiness on feet (R26.81)    Time: 6440-3474 PT Time  Calculation (min) (ACUTE ONLY): 23 min   Charges:          Wells Guiles B. Drayk Humbarger, PT, DPT [phone]   PT Evaluation $PT Eval Moderate Complexity: 1 Mod PT Treatments $Gait Training: 8-22 mins       08/19/2018, 4:19 PM

## 2018-08-19 NOTE — Progress Notes (Signed)
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   Assessment & Plan:   Active Problems:   Sepsis ([place])   #) Sepsis of unclear etiology: Currently the only relevant culture data that is shown up is a UTI growing approximately 60,000 colonies of pansensitive E. coli.  Her chest x-ray on admission showed chronic interstitial lung changes in her urine cultures have been negative.  She is defervesced on IV antibiotics. -Blood cultures from 08/17/2018 no growth to date -Blood cultures from 08/18/2018 pending -Urine culture from 08/17/2018 growing 60,000 colonies of E. coli -Continue IV ceftriaxone started 08/18/2018  #) Chronic atrial fibrillation complicated by rapid ventricular response: Patient continues to have runs of RVR.  She is clinically symptomatically however taking it quite high to the 160s overnight. -Increase metoprolol tartrate 50 mill grams every 6 hours -PRN IV metoprolol 2.5 mg every 4 hours as needed for heart rate greater than 130 sustained - Continue warfarin, pharmacy consult for anticoagulation  #) Hypertension/hyperlipidemia: Patient continues to have episodes of hypertension that are asymptomatic. -Restart home losartan 100 mg daily -Per above increase home metoprolol tartrate to 50 mg every 6 hours -Continue atorvastatin 20 mg nightly  #) Status post bioprosthetic AVR: -Stable  #) Warm autoimmune hemolytic anemia: This was a recent diagnosis from hematology.  She is been  on prednisone for this.  Initially when she presented there was a concern that she might need stress dose steroids however at this time she clinically is improved. -Continue home prednisone 10 mg daily -Low threshold to start stress dose hydrocortisone if patient deteriorates  #) Hypo thyroidism: -Continue levothyroxine 88 mcg  #) Pain/psych: - Continue gabapentin 200 mg 3 times daily -Continue alprazolam 0.25 mg nightly  Fluids: Tolerating p.o. Electrodes: Monitor and supplement Nutrition: Heart healthy diet  Prophylaxis: On warfarin  Disposition: Pending PT evaluation and negative blood cultures for 48 hours  DO NOT RESUSCITATE    Consultants:   None  Procedures:   None  Antimicrobials:   IV ceftriaxone started 08/18/2018   Subjective: Patient reports she is doing fairly well.  She does continue to report significant weakness.  She reports that her heart rate does cause her some distress and anxiety when it gets quite high as she can feel it.  She denies any nausea, vomiting, diarrhea.  She is quite eager to get up out of bed and walk around.  Objective: Vitals:   08/18/18 2335 08/19/18 0000 08/19/18 0455 08/19/18 0808  BP: (!) 173/110 (!) 167/96 (!) 164/82 (!) 191/116  Pulse:  (!) 109 92 (!) 130  Resp: 20  20 18   Temp:   98.5 F (36.9 C) 98.2 F (36.8 C)  TempSrc:   Oral Oral  SpO2: 100%  100% 99%  Weight:      Height:        Intake/Output Summary (Last 24 hours) at 08/19/2018 0927 Last data filed at 08/19/2018 0600 Gross per 24 hour  Intake 1210 ml  Output 2501 ml  Net -1291 ml   Filed Weights   08/17/18 1705  Weight: 66.2 kg  Examination:  General exam: Appears calm and comfortable  Respiratory system: No increased work of breathing, scattered rhonchi, no wheezes or crackles Cardiovascular system: Tachycardic, irregularly irregular, no murmurs Gastrointestinal system: Abdomen is nondistended, soft and nontender. No organomegaly or masses  felt. Normal bowel sounds heard. Central nervous system: Alert and oriented. No focal neurological deficits. Extremities: Trace lower extremity edema Skin: No rashes visible skin Psychiatry: Judgement and insight appear normal. Mood & affect appropriate.     Data Reviewed: I have personally reviewed following labs and imaging studies  CBC: Recent Labs  Lab 08/17/18 0202 08/17/18 1731 08/18/18 0229 08/19/18 0331  WBC 17.4* 15.2* 9.6 8.6  NEUTROABS 16.1* 13.7*  --   --   HGB 14.6 13.3 11.2* 12.1  HCT 42.1 39.7 33.7* 35.4*  MCV 95.5 99.5 97.4 95.9  PLT 210 144* 109* 262*   Basic Metabolic Panel: Recent Labs  Lab 08/17/18 0202 08/17/18 1731 08/18/18 0229 08/19/18 0331  NA 133* 130* 130* 132*  K 4.2 4.0 3.6 3.7  CL 96* 99 100 100  CO2 26 23 22 24   GLUCOSE 110* 152* 122* 137*  BUN 33* 19 13 15   CREATININE 1.04* 0.98 0.80 0.81  CALCIUM 9.6 8.9 7.9* 8.6*  MG  --   --   --  1.8   GFR: Estimated Creatinine Clearance: 44.9 mL/min (by C-G formula based on SCr of 0.81 mg/dL). Liver Function Tests: Recent Labs  Lab 08/17/18 0202 08/18/18 0229 08/19/18 0331  AST 178* 48* 32  ALT 123* 70* 54*  ALKPHOS 65 43 47  BILITOT 1.4* 1.2 0.8  PROT 6.4* 4.7* 5.4*  ALBUMIN 4.3 2.9* 3.1*   No results for input(s): LIPASE, AMYLASE in the last 168 hours. No results for input(s): AMMONIA in the last 168 hours. Coagulation Profile: Recent Labs  Lab 08/17/18 0202 08/18/18 0229 08/19/18 0331  INR 2.77 3.27 3.72   Cardiac Enzymes: No results for input(s): CKTOTAL, CKMB, CKMBINDEX, TROPONINI in the last 168 hours. BNP (last 3 results) No results for input(s): PROBNP in the last 8760 hours. HbA1C: No results for input(s): HGBA1C in the last 72 hours. CBG: No results for input(s): GLUCAP in the last 168 hours. Lipid Profile: No results for input(s): CHOL, HDL, LDLCALC, TRIG, CHOLHDL, LDLDIRECT in the last 72 hours. Thyroid Function Tests: Recent Labs    08/18/18 0229  TSH  1.429   Anemia Panel: No results for input(s): VITAMINB12, FOLATE, FERRITIN, TIBC, IRON, RETICCTPCT in the last 72 hours. Sepsis Labs: Recent Labs  Lab 08/17/18 0209 08/17/18 1742 08/17/18 2242 08/18/18 0229  LATICACIDVEN 1.77 1.94* 3.4* 1.1    Recent Results (from the past 240 hour(s))  Blood Culture (routine x 2)     Status: None (Preliminary result)   Collection Time: 08/17/18  2:02 AM  Result Value Ref Range Status   Specimen Description   Final    BLOOD LEFT ANTECUBITAL Performed at [facility], [place] [address], [place], [place] [address]    Special Requests   Final    BOTTLES DRAWN AEROBIC AND ANAEROBIC Blood Culture adequate volume Performed at [facility] [address], [place], [place] [address]    Culture   Final    NO GROWTH 1 DAY Performed at [facility], [place] [address] [place], [place] [address]    Report Status PENDING  Incomplete  Blood Culture (routine x 2)     Status: None (Preliminary result)   Collection Time: 08/17/18  2:02 AM  Result Value Ref Range Status   Specimen Description   Final    BLOOD RIGHT ANTECUBITAL Performed at [facility] [address], [place], [place] [address]    Special Requests   Final    BOTTLES DRAWN AEROBIC AND ANAEROBIC Blood Culture adequate volume Performed at [facility] [address], [place], [place] [address]    Culture   Final    NO GROWTH 1 DAY Performed at [facility], [place] [address] [place], [place] [address]    Report Status PENDING  Incomplete  Urine culture     Status: Abnormal   Collection Time: 08/17/18  2:02 AM  Result Value Ref Range Status   Specimen Description   Final    URINE, RANDOM Performed at [facility] [address], [place], [place] [address]    Special Requests   Final    NONE Performed at [facility], [place] [address], [place], [place] [address]      Culture 60,000 COLONIES/mL ESCHERICHIA COLI (A)  Final   Report Status 08/19/2018 FINAL  Final   Organism ID, Bacteria ESCHERICHIA COLI (A)  Final      Susceptibility   Escherichia coli - MIC*    AMPICILLIN 8 SENSITIVE Sensitive     CEFAZOLIN <=4 SENSITIVE Sensitive     CEFTRIAXONE <=1 SENSITIVE Sensitive     CIPROFLOXACIN <=0.25 SENSITIVE Sensitive     GENTAMICIN <=1 SENSITIVE Sensitive     IMIPENEM <=0.25 SENSITIVE Sensitive     NITROFURANTOIN <=16 SENSITIVE Sensitive     TRIMETH/SULFA <=20 SENSITIVE Sensitive     AMPICILLIN/SULBACTAM 4 SENSITIVE Sensitive     PIP/TAZO <=4 SENSITIVE Sensitive     Extended ESBL NEGATIVE Sensitive     * 60,000 COLONIES/mL ESCHERICHIA COLI         Radiology Studies: Dg Chest 2 View  Result Date: 08/17/2018 CLINICAL DATA:  Recent diagnosis of UTI.  Found down today. EXAM: CHEST - 2 VIEW COMPARISON:  Chest x-ray from earlier same day. Chest x-ray dated 02/21/2012. FINDINGS: Stable cardiomegaly. Median sternotomy wires appear intact and stable in alignment. Coarse interstitial markings again noted bilaterally, stable in the short-term interval. No new lung findings. No confluent opacity to suggest a developing pneumonia. No pleural effusion or pneumothorax seen. Degenerative changes at the bilateral shoulders, at least moderate in degree. No acute or suspicious osseous finding. IMPRESSION: 1. Coarse interstitial markings again noted throughout both lungs, suspected chronic interstitial lung disease. 2. No acute findings.  No evidence of pneumonia or pulmonary edema. 3. Stable mild cardiomegaly. Electronically Signed   By: Franki Cabot M.D.   On: 08/17/2018 18:14        Scheduled Meds: . ALPRAZolam  0.25 mg Oral QHS  . atorvastatin  20 mg Oral QHS  . calcium-vitamin D  1 tablet Oral Q breakfast  . docusate sodium  100 mg Oral QHS  . gabapentin  200 mg Oral TID  . levothyroxine  88 mcg Oral QAC breakfast  . losartan  100 mg Oral Daily  .  metoprolol tartrate  50 mg Oral Q6H  . multivitamin with minerals  1 tablet Oral Daily  . oxybutynin  5 mg Oral QODAY  . pantoprazole  40 mg Oral Daily  . predniSONE  10 mg Oral Q breakfast  . saccharomyces boulardii  250 mg Oral Daily  . sodium chloride flush  3 mL Intravenous Q12H  . Warfarin - Pharmacist Dosing Inpatient  Does not apply q1800   Continuous Infusions: . cefTRIAXone (ROCEPHIN)  IV       LOS: 2 days    Time spent: P[place], MD [facility]  If 7PM-7AM, please contact night-coverage [email or web address] Password [facility] 08/19/2018, 9:27 AM

## 2018-08-19 NOTE — Progress Notes (Signed)
OT Evaluation  PTA, pt lived in [place] at [facility] and received assistance from a PCA 2-3 times per week for ADL and IADL tasks. Pt reports decreased functional vision and more difficulty completing functional tasks. Recommend pt follow up with [facility]. Recommend pt contact [facility] to assists with home modifications to increase her safety and reduce risk of falls.Pt verbalized understanding.     08/19/18 1625  OT Visit Information  Last OT Received On 08/19/18  Assistance Needed +1  History of Present Illness 84 y.o. female admitted on 08/17/18 for weakness, dx with sepsis with (+) e coli UTI.  Pt also having runs of A-fib with RVR (rates >160) during her stay requiring HR control meds.  Pt with other significant PMH of spinal stenosis/lumbar disc disease s/p lumbar laminectomy, CABG, aortic valve replacement, hypertensive heart disease, HTN, CAD, aortic stenosis, and anemia.  Precautions  Precautions Fall;Other (comment)  Precaution Comments monitor HR  Restrictions  Weight Bearing Restrictions No  Home Living  Family/patient expects to be discharged to: Private residence  Living Arrangements Alone  Available Help at Discharge Personal care attendant;Available PRN/intermittently  Type of Home Independent living facility  Home Access Level entry  Home Layout One level  Bathroom Shower/Tub Walk-in Cytogeneticist Yes  How Accessible Accessible via walker  [place] - 4 wheels;Cane - single point;Shower seat - built in  Prior Function  Level of Independence Independent with assistive device(s);Needs assistance  Gait / Transfers Assistance Needed uses rollator for in room and walks to dining hall.  She reports she does not like a regular walker because it doesn't turn as smoothly, but she has a hard time getting the rollator into the car  ADL's / [place] assist from housekeeper  and personal aide that comes several days per week  for shower; sponge bathes other days  Comments poor vision, Blind R eye; poor vision L eye; see doc flow  Communication  Communication No difficulties  Pain Assessment  Pain Assessment No/denies pain  Cognition  Arousal/Alertness Awake/alert  Behavior During Therapy WFL for tasks assessed/performed  Overall Cognitive Status Within Functional Limits for tasks assessed  Upper Extremity Assessment  Upper Extremity Assessment Generalized weakness (B shoulder limitations from apparent RTC insufficiency)  Lower Extremity Assessment  Lower Extremity Assessment Defer to PT evaluation  Cervical / Trunk Assessment  Cervical / Trunk Assessment Kyphotic (with some signs of scoliotic changes- S curve)  ADL  Overall ADL's  Needs assistance/impaired  Grooming Supervision/safety;Standing  Upper Body Bathing Set up;Sitting  Lower Body Bathing Supervison/ safety;Sit to/from stand  Upper Body Dressing  Supervision/safety;Set up  Upper Body Dressing Details (indicate cue type and reason) pt has difficulty with buttons  Lower Body Dressing Supervision/safety;Set up;Sit to/from stand  Lower Body Dressing Details (indicate cue type and reason) Pt requires assistnace for socks; may benefit form sock aid  Toilet Transfer Min guard;RW  Toileting- Clothing Manipulation and Hygiene Min guard  Functional mobility during ADLs Min guard;Rolling walker  General ADL Comments ADL impacted by vision deficits  Vision- History  Baseline Vision/History Wears glasses  Wears Glasses At all times  Vision- Assessment  Additional Comments 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   Transfers  Overall transfer level Needs assistance  Equipment used [employer] (2 wheeled)  Transfers Sit to/from Stand   Sit to [employer] transfer comment Min guard assist from recliner chair and BSC in bathroom.   Balance  Overall balance assessment Needs assistance  Sitting-balance support Feet supported;No upper extremity supported  Sitting balance-Leahy Scale Good  Standing balance support Bilateral upper extremity supported;Single extremity supported;No upper extremity supported  Standing balance-Leahy Scale Fair  Standing balance comment close supervision needed for tasks at the sink and pt usually had hands on support for balance during hand washing.   OT - End of Session  Equipment Utilized During Treatment Gait belt;Rolling walker  Activity Tolerance Patient tolerated treatment well  Patient left in chair;with call bell/phone within reach;with nursing/sitter in room  Nurse Communication Mobility status  OT Assessment  OT Recommendation/Assessment Patient needs continued OT Services  OT Visit Diagnosis Unsteadiness on feet (R26.81);Muscle weakness (generalized) (M62.81);History of falling (Z91.81)  OT Problem List Decreased strength;Impaired balance (sitting and/or standing);Decreased safety awareness;Decreased knowledge of use of DME or AE;Obesity;Impaired UE functional use  OT Plan  OT Frequency (ACUTE ONLY) Min 2X/week  OT Treatment/Interventions (ACUTE ONLY) Self-care/ADL training;DME and/or AE instruction;Therapeutic activities;Visual/perceptual remediation/compensation;Patient/family education;Balance training  AM-PAC OT "6 Clicks" Daily Activity Outcome Measure  Help from another person eating meals? 4  Help from another person taking care of personal grooming? 4  Help from another person toileting, which includes using toliet, bedpan, or urinal? 3  Help from another person bathing (including washing, rinsing, drying)? 3  Help from another person to put on and taking off regular upper body clothing? 3  Help from another person to put on and taking off regular lower body  clothing? 3  6 Click Score 20  ADL G Code Conversion CJ  OT Recommendation  Follow Up Recommendations Home health OT;Supervision - Intermittent  OT Equipment None recommended by OT  Individuals Consulted  Consulted and Agree with Results and Recommendations Patient  Acute Rehab OT Goals  Patient Stated Goal to go home and be safe  OT Goal Formulation With patient  Time For Goal Achievement 09/02/18  Potential to Achieve Goals Good  OT Time Calculation  OT Start Time (ACUTE ONLY) 1548  OT Stop Time (ACUTE ONLY) 1608  OT Time Calculation (min) 20 min  OT General Charges  $OT Visit 1 Visit  OT Evaluation  $OT Eval Low Complexity 1 Low  Written Expression  Dominant Hand Right  Maurie Boettcher, OT/L  OT Clinical Specialist [phone]

## 2018-08-19 NOTE — Progress Notes (Signed)
Paged Dr. Genella Mech for bp 177/116 hr 85, nothing PRN for BP.  Dr. Herbert Moors returned page, monitor for now.

## 2018-08-19 NOTE — Progress Notes (Signed)
ANTICOAGULATION CONSULT NOTE - Follow-up Consult  Pharmacy Consult for Warfarin Indication: h/o AFib,  Tissue AVR  Allergies  Allergen Reactions  . Ibuprofen Other (See Comments)    Hx of GI bleed  . Nucynta [Tapentadol] Other (See Comments)    Exact reaction not recalled, but knows she "cannot take"  . Ace Inhibitors Cough  . Doxycycline Nausea Only  . Hydrocodone Nausea Only  . Nsaids     Can only tolerate Tylenol (has a history of GI BLEEDS)  . Adhesive [Tape] Rash    Blisters (can use only paper tape)  . Latex Rash and Other (See Comments)    Paper tape only    Patient Measurements: Height: 5\' 4"  (162.6 cm) Weight: 146 lb (66.2 kg) IBW/kg (Calculated) : 54.7   Vital Signs: Temp: 98.5 F (36.9 C) (08/27 0455) Temp Source: Oral (08/27 0455) BP: 164/82 (08/27 0455) Pulse Rate: 92 (08/27 0455)  Labs: Recent Labs    08/17/18 0202 08/17/18 1731 08/18/18 0229 08/19/18 0331  HGB 14.6 13.3 11.2* 12.1  HCT 42.1 39.7 33.7* 35.4*  PLT 210 144* 109* 123*  LABPROT 29.0*  --  33.1* 36.6*  INR 2.77  --  3.27 3.72  CREATININE 1.04* 0.98 0.80 0.81    Estimated Creatinine Clearance: 44.9 mL/min (by C-G formula based on SCr of 0.81 mg/dL).   Medical History: Past Medical History:  Diagnosis Date  . Acute blood loss anemia 05/16/2018  . Aortic stenosis   . Coronary artery disease    Cath 1/13 left main  . 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 Dyspnea   . Hyperlipidemia   . Hypertension   . Hypertensive heart disease without CHF   . Hypothyroidism   . Lumbar disc disease   . Lung nodules    Sees [place], thought to be benign  . Obesity   . Spinal stenosis   . Villous adenoma of rectum     Medications:  Medications Prior to Admission  Medication Sig Dispense Refill Last Dose  . ALPRAZolam (XANAX) 0.25 MG tablet Take 0.25 mg by mouth at bedtime.  1 08/16/2018 at Unknown time  . atorvastatin (LIPITOR) 20 MG tablet Take 20 mg by mouth at  bedtime.     08/16/2018 at 2130  . BESIVANCE 0.6 % SUSP Place 1 drop into the left eye See admin instructions. Place 1 drop left eye 4 times daily for 2 days after Avastin eye injections  11 Past Month at Unknown time  . Calcium Carbonate-Vitamin D (CALCIUM 600 + D PO) Take 1 tablet by mouth every morning.    08/16/2018 at Unknown time  . cephALEXin (KEFLEX) 250 MG capsule Take 1 capsule (250 mg total) by mouth 3 (three) times daily. 30 capsule 0 08/17/2018 at Unknown time  . docusate sodium (COLACE) 100 MG capsule Take 100 mg by mouth at bedtime.    08/16/2018 at Unknown time  . gabapentin (NEURONTIN) 100 MG capsule Take 200 mg by mouth 3 (three) times daily.   0 08/17/2018 at Unknown time  . levothyroxine (SYNTHROID, LEVOTHROID) 88 MCG tablet Take 88 mcg by mouth daily before breakfast.   2 08/16/2018 at Unknown time  . losartan (COZAAR) 100 MG tablet Take 100 mg by mouth daily.  4 08/16/2018 at Unknown time  . metoprolol tartrate (LOPRESSOR) 25 MG tablet Take 25 mg by mouth 2 (two) times daily.  4 08/16/2018 at 2130  . Multiple Vitamin (MULITIVITAMIN WITH MINERALS) TABS Take 1 tablet by mouth  daily.     08/16/2018 at Unknown time  . oxybutynin (DITROPAN) 5 MG tablet Take 5 mg by mouth every other day.   Past Week at Unknown time  . polyethylene glycol (MIRALAX / GLYCOLAX) packet Take 17 g by mouth daily as needed for mild constipation.    unk  . predniSONE (DELTASONE) 5 MG tablet Take 10 mg by mouth daily with breakfast.   08/17/2018 at Unknown time  . Probiotic Product (PROBIOTIC PO) Take 1 capsule by mouth daily.   08/16/2018 at Unknown time  . warfarin (COUMADIN) 1 MG tablet Take 1-2 mg by mouth See admin instructions. Take 1 mg by mouth at bedtime on Sun/Mon/Wed/Fri/Sat and 2 mg on Tues and Thurs, in conjunction with one 5 mg tablet.  0 08/16/2018 at 2130  . warfarin (COUMADIN) 5 MG tablet Take 5 mg by mouth at bedtime.    08/16/2018 at 2130    Assessment: 84 y.o female on Warfarin PTA for h/o Afib.   Has h/o bovine tissue AVR [facility] Ease 1/4 2013). INR 3.72 , Supratherapeutic this AM.  Goal INR 2-3  Pltc 123, H/H wnl . No bleeding noted   PTA warfarin dose 6 mg daily except 7 mg qTue & Thurs., last taken 8/24 at 21:30 (takes at bedtime)  <has 1mg  tabs and 5mg  tabs PTA>  Patient started on abx for sepsis which may be affecting the patient's INR.   Goal of Therapy:  INR 2-3 Monitor platelets by anticoagulation protocol: Yes   Plan:  Hold Warfarin tonight Daily INR  Monitor for s/sx of bleeding   Minnetta Sandora A. Levada Dy, PharmD, [place] Pager: [phone] Please utilize Amion for appropriate phone number to reach the unit pharmacist ([place])   08/19/2018,7:55 AM

## 2018-08-20 ENCOUNTER — Telehealth: Payer: Self-pay | Admitting: Emergency Medicine

## 2018-08-20 DIAGNOSIS — A4151 Sepsis due to Escherichia coli [E. coli]: Principal | ICD-10-CM

## 2018-08-20 LAB — BASIC METABOLIC PANEL
Anion gap: 9 (ref 5–15)
BUN: 18 mg/dL (ref 8–23)
CO2: 27 mmol/L (ref 22–32)
Calcium: 8.6 mg/dL — ABNORMAL LOW (ref 8.9–10.3)
Chloride: 95 mmol/L — ABNORMAL LOW (ref 98–111)
Creatinine, Ser: 0.91 mg/dL (ref 0.44–1.00)
GFR calc Af Amer: >60 mL/min (ref >60)
GFR calc non Af Amer: 55 mL/min — ABNORMAL LOW (ref >60)
Glucose, Bld: 93 mg/dL (ref 70–99)
Potassium: 4.2 mmol/L (ref 3.5–5.1)
Sodium: 131 mmol/L — ABNORMAL LOW (ref 135–145)

## 2018-08-20 LAB — PROTIME-INR
INR: 2.45
Prothrombin Time: 26.4 seconds — ABNORMAL HIGH (ref 11.4–15.2)

## 2018-08-20 LAB — CBC
HCT: 34.1 % — ABNORMAL LOW (ref 36.0–46.0)
Hemoglobin: 11.2 g/dL — ABNORMAL LOW (ref 12.0–15.0)
MCH: 32.1 pg (ref 26.0–34.0)
MCHC: 32.8 g/dL (ref 30.0–36.0)
MCV: 97.7 fL (ref 78.0–100.0)
Platelets: 121 10*3/uL — ABNORMAL LOW (ref 150–400)
RBC: 3.49 MIL/uL — ABNORMAL LOW (ref 3.87–5.11)
RDW: 14 % (ref 11.5–15.5)
WBC: 8.1 10*3/uL (ref 4.0–10.5)

## 2018-08-20 LAB — MAGNESIUM: Magnesium: 1.9 mg/dL (ref 1.7–2.4)

## 2018-08-20 MED ORDER — METOPROLOL TARTRATE 50 MG PO TABS
50.0000 mg | ORAL_TABLET | Freq: Two times a day (BID) | ORAL | Status: DC
  Filled 2018-08-20: qty 1

## 2018-08-20 MED ORDER — CEFDINIR 300 MG PO CAPS: 300.0000 mg | ORAL_CAPSULE | Freq: Two times a day (BID) | ORAL | 0 refills | Status: AC

## 2018-08-20 MED ORDER — WARFARIN SODIUM 4 MG PO TABS
4.0000 mg | ORAL_TABLET | Freq: Once | ORAL | Status: AC
  Administered 2018-08-20: 4 mg via ORAL
  Filled 2018-08-20: qty 1

## 2018-08-20 MED ORDER — METOPROLOL TARTRATE 50 MG PO TABS: 25.0000 mg | ORAL_TABLET | Freq: Two times a day (BID) | ORAL | 0 refills | Status: DC

## 2018-08-20 NOTE — Progress Notes (Signed)
ANTICOAGULATION CONSULT NOTE - Follow-up Consult  Pharmacy Consult for Warfarin Indication: h/o AFib,  Tissue AVR  Allergies  Allergen Reactions  . Ibuprofen Other (See Comments)    Hx of GI bleed  . Nucynta [Tapentadol] Other (See Comments)    Exact reaction not recalled, but knows she "cannot take"  . Ace Inhibitors Cough  . Doxycycline Nausea Only  . Hydrocodone Nausea Only  . Nsaids     Can only tolerate Tylenol (has a history of GI BLEEDS)  . Adhesive [Tape] Rash    Blisters (can use only paper tape)  . Latex Rash and Other (See Comments)    Paper tape only    Patient Measurements: Height: 5\' 4"  (162.6 cm) Weight: 146 lb (66.2 kg) IBW/kg (Calculated) : 54.7   Vital Signs: Temp: 98.6 F (37 C) (08/28 0541) Temp Source: Oral (08/28 0541) BP: 158/90 (08/28 0541) Pulse Rate: 79 (08/28 0541)  Labs: Recent Labs    08/18/18 0229 08/19/18 0331 08/20/18 0313  HGB 11.2* 12.1 11.2*  HCT 33.7* 35.4* 34.1*  PLT 109* 123* 121*  LABPROT 33.1* 36.6* 26.4*  INR 3.27 3.72 2.45  CREATININE 0.80 0.81 0.91    Estimated Creatinine Clearance: 40 mL/min (by C-G formula based on SCr of 0.91 mg/dL).   Medical History: Past Medical History:  Diagnosis Date  . Acute blood loss anemia 05/16/2018  . Aortic stenosis   . Coronary artery disease    Cath 1/13 left main  . 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 Dyspnea   . Hyperlipidemia   . Hypertension   . Hypertensive heart disease without CHF   . Hypothyroidism   . Lumbar disc disease   . Lung nodules    Sees Wert, thought to be benign  . Obesity   . Spinal stenosis   . Villous adenoma of rectum     Medications:  Medications Prior to Admission  Medication Sig Dispense Refill Last Dose  . ALPRAZolam (XANAX) 0.25 MG tablet Take 0.25 mg by mouth at bedtime.  1 08/16/2018 at Unknown time  . atorvastatin (LIPITOR) 20 MG tablet Take 20 mg by mouth at bedtime.     08/16/2018 at 2130  . BESIVANCE 0.6 %  SUSP Place 1 drop into the left eye See admin instructions. Place 1 drop left eye 4 times daily for 2 days after Avastin eye injections  11 Past Month at Unknown time  . Calcium Carbonate-Vitamin D (CALCIUM 600 + D PO) Take 1 tablet by mouth every morning.    08/16/2018 at Unknown time  . cephALEXin (KEFLEX) 250 MG capsule Take 1 capsule (250 mg total) by mouth 3 (three) times daily. 30 capsule 0 08/17/2018 at Unknown time  . docusate sodium (COLACE) 100 MG capsule Take 100 mg by mouth at bedtime.    08/16/2018 at Unknown time  . gabapentin (NEURONTIN) 100 MG capsule Take 200 mg by mouth 3 (three) times daily.   0 08/17/2018 at Unknown time  . levothyroxine (SYNTHROID, LEVOTHROID) 88 MCG tablet Take 88 mcg by mouth daily before breakfast.   2 08/16/2018 at Unknown time  . losartan (COZAAR) 100 MG tablet Take 100 mg by mouth daily.  4 08/16/2018 at Unknown time  . metoprolol tartrate (LOPRESSOR) 25 MG tablet Take 25 mg by mouth 2 (two) times daily.  4 08/16/2018 at 2130  . Multiple Vitamin (MULITIVITAMIN WITH MINERALS) TABS Take 1 tablet by mouth daily.     08/16/2018 at Unknown time  . oxybutynin (  DITROPAN) 5 MG tablet Take 5 mg by mouth every other day.   Past Week at Unknown time  . polyethylene glycol (MIRALAX / GLYCOLAX) packet Take 17 g by mouth daily as needed for mild constipation.    unk  . predniSONE (DELTASONE) 5 MG tablet Take 10 mg by mouth daily with breakfast.   08/17/2018 at Unknown time  . Probiotic Product (PROBIOTIC PO) Take 1 capsule by mouth daily.   08/16/2018 at Unknown time  . warfarin (COUMADIN) 1 MG tablet Take 1-2 mg by mouth See admin instructions. Take 1 mg by mouth at bedtime on Sun/Mon/Wed/Fri/Sat and 2 mg on Tues and Thurs, in conjunction with one 5 mg tablet.  0 08/16/2018 at 2130  . warfarin (COUMADIN) 5 MG tablet Take 5 mg by mouth at bedtime.    08/16/2018 at 2130    Assessment: 84 y.o female on Warfarin PTA for h/o Afib.  Has h/o bovine tissue AVR [facility] Ease 1/4 2013). INR  2.45 , therapeutic this AM.  Goal INR 2-3  Pltc 121, H/H wnl . No bleeding noted   PTA warfarin dose 6 mg daily except 7 mg qTue & Thurs., last taken 8/24 at 21:30 (takes at bedtime)  <has 1mg  tabs and 5mg  tabs PTA>  Patient started on abx for sepsis which may be affecting the patient's INR.   Goal of Therapy:  INR 2-3 Monitor platelets by anticoagulation protocol: Yes   Plan:  Warfarin 4 mg tonight Daily INR  Monitor for s/sx of bleeding   Kaydyn Chism A. Levada Dy, PharmD, [place] Pager: [phone] Please utilize Amion for appropriate phone number to reach the unit pharmacist ([place])   08/20/2018,8:00 AM

## 2018-08-20 NOTE — Progress Notes (Signed)
Occupational Therapy Treatment Patient Details Name: Carol [name] MRN: [id number] DOB: 07-Dec-1928 Today's Date: 08/20/2018    History of present illness 84 y.o. female admitted on 08/17/18 for weakness, dx with sepsis with (+) e coli UTI.  Pt also having runs of A-fib with RVR (rates >160) during her stay requiring HR control meds.  Pt with other significant PMH of spinal stenosis/lumbar disc disease s/p lumbar laminectomy, CABG, aortic valve replacement, hypertensive heart disease, HTN, CAD, aortic stenosis, and anemia.   OT comments  Completed further education on possible use of AE for ADL and need to contact [facility] to further assess need to more adaptive devices within the home to help increase independence and reduce risk of falls. Pt verbalized understanding.   Follow Up Recommendations  Home health OT;Supervision - Intermittent    Equipment Recommendations  None recommended by OT    Recommendations for Other Services      Precautions / Restrictions Precautions Precautions: Fall;Other (comment) Restrictions Weight Bearing Restrictions: No       Mobility Bed Mobility               General bed mobility comments: Pt was OOB in the recliner chair.   Transfers                      Balance                                           ADL either performed or assessed with clinical judgement   ADL                                         General ADL Comments: Educated pt on use of sock aid. Difficulty with use due to Mepalex heel pads; discussed possible use of AE for ADL and further assessment by [facility]; Educated pt on need to contact [facility] to further assess home needs in conjusnction with HHOT; pt verbalized understanding.     Vision   Additional Comments: vision deficits adn impact on function discussed   Perception     Praxis      Cognition Arousal/Alertness: Awake/alert Behavior  During Therapy: WFL for tasks assessed/performed Overall Cognitive Status: Within Functional Limits for tasks assessed                                 General Comments: apparent STM deficits; pt had difficulty recalling conversation from yesterday        Exercises     Shoulder Instructions       General Comments      Pertinent Vitals/ Pain       Pain Assessment: No/denies pain  Home Living                                          Prior Functioning/Environment              Frequency           Progress Toward Goals  OT Goals(current goals can now be found in the care plan section)  Progress  towards OT goals: Progressing toward goals  Acute Rehab OT Goals Patient Stated Goal: to go home and be safe OT Goal Formulation: With patient Time For Goal Achievement: 09/02/18 Potential to Achieve Goals: Good ADL Goals Pt Will Transfer to Toilet: with modified independence;ambulating Pt Will Perform Toileting - Clothing Manipulation and hygiene: with modified independence;sit to/from stand Additional ADL Goal #1: Pt will independently verbalize 3 strategies to reduce risk of falls  Plan All goals met and education completed, patient discharged from OT services(further OT to be addressed by HHOT)    Co-evaluation                 AM-PAC PT "6 Clicks" Daily Activity     Outcome Measure   Help from another person eating meals?: None Help from another person taking care of personal grooming?: None Help from another person toileting, which includes using toliet, bedpan, or urinal?: A Little Help from another person bathing (including washing, rinsing, drying)?: A Little Help from another person to put on and taking off regular upper body clothing?: A Little Help from another person to put on and taking off regular lower body clothing?: A Little 6 Click Score: 20    End of Session    OT Visit Diagnosis: Unsteadiness on feet  (R26.81);Muscle weakness (generalized) (M62.81);History of falling (Z91.81)   Activity Tolerance Patient tolerated treatment well   Patient Left in chair;with call bell/phone within reach   Nurse Communication Mobility status        Time: 1100-1115 OT Time Calculation (min): 15 min  Charges: OT General Charges $OT Visit: 1 Visit OT Treatments $Self Care/Home Management : 8-22 mins  Maurie Boettcher, OT/L  OT Clinical Specialist [phone]    [facility] 08/20/2018, 11:57 AM

## 2018-08-20 NOTE — Care Management Note (Signed)
Case Management Note  Patient Details  Name: MAKALEIGH REINARD MRN: [id number] Date of Birth: [birth year]  Subjective/Objective: Pt admitted with UTI                    Action/Plan:  PTA pt was independent from [place] - daughter will transport pt back to facility at discharge.  Pt denied barriers with obtaining/paying for medications.  Pt is interested in resuming HHPT and adding HHOT - services are contracted out via [place].  CM left voicemail for [facility] informing of discharge and that [place] and OT were ordered - CM requested a call back.  CM also confirmed Linda's fax number with secretary [phone].  CM faxed orders and face to face to number.   Expected Discharge Date:  08/20/18               Expected Discharge Plan:  Home/Self Care  In-House Referral:     Discharge planning Services  CM Consult  Post Acute Care Choice:    Choice offered to:  (Facility Choice)  DME Arranged:    DME Agency:     HH Arranged:  PT, OT HH Agency:  ([place] facility)  Status of Service:  Completed, signed off  If discussed at [employer], dates discussed:    Additional Comments:  Maryclare Labrador, RN 08/20/2018, 10:21 AM

## 2018-08-20 NOTE — Consult Note (Signed)
   [facility] CM Inpatient Consult   08/20/2018  Carol [name] 1928-05-10 [id number]  Patient screened for potential [place] [facility] services. Patient is in the [place] [facility] the [place] [facility] services under patient's Medicare plan.  Patient assessed for high risk.  Patient is from [place],  Patient has transportation with daughter.  Came by to meet patient and she was working with Physical Therapy. No needs noted at this time. For questions contact:   Natividad Brood, RN BSN [facility] Liaison  [phone] business mobile phone Toll free office [phone]

## 2018-08-20 NOTE — Progress Notes (Signed)
Physical Therapy Treatment Patient Details Name: Carol [name] MRN: [id number] DOB: September 04, 1928 Today's Date: 08/20/2018    History of Present Illness 84 y.o. female admitted on 08/17/18 for weakness, dx with sepsis with (+) e coli UTI.  Pt also having runs of A-fib with RVR (rates >160) during her stay requiring HR control meds.  Pt with other significant PMH of spinal stenosis/lumbar disc disease s/p lumbar laminectomy, CABG, aortic valve replacement, hypertensive heart disease, HTN, CAD, aortic stenosis, and anemia.    PT Comments    Continuing work on functional mobility and activity tolerance;  Noting improved smoothness with amb using a 4-wheeled RW, much like her own; For dc home today, agree with HHPT/OT follow up   Follow Up Recommendations  Home health PT;Other (comment)(resume HHPT, and add HHOT)     Equipment Recommendations  None recommended by PT    Recommendations for Other Services       Precautions / Restrictions Precautions Precautions: Fall;Other (comment) Precaution Comments: monitor HR    Mobility  Bed Mobility                  Transfers Overall transfer level: Needs assistance Equipment used: Rolling walker (2 wheeled) Transfers: Sit to/from Stand Sit to Stand: Min guard         General transfer comment: Minguard assist; sat to and stood from [employer]; some assist to steady Rollaotr  Ambulation/Gait Ambulation/Gait assistance: Min guard Gait Distance (Feet): 100 Feet Assistive device: 4-wheeled walker Gait Pattern/deviations: Step-through pattern;Decreased step length - right;Decreased step length - left;Decreased stride length     General Gait Details: Smoother gait with Rollator; telemetry had been dc'd as pt is to go home today; no reported of dizziness or feeling like heart was Manufacturing systems engineer [place] (Stroke Patients Only)       Balance     Sitting balance-Leahy  Scale: Good       Standing balance-Leahy Scale: Fair                              Cognition Arousal/Alertness: Awake/alert Behavior During Therapy: WFL for tasks assessed/performed Overall Cognitive Status: Within Functional Limits for tasks assessed                                 General Comments: Not specifically tested, but conversation normal      Exercises      General Comments        Pertinent Vitals/Pain Pain Assessment: No/denies pain    Home Living                      Prior Function            PT Goals (current goals can now be found in the care plan section) Acute Rehab PT Goals Patient Stated Goal: to go home and be safe PT Goal Formulation: With patient Time For Goal Achievement: 09/02/18 Potential to Achieve Goals: Good Progress towards PT goals: Progressing toward goals    Frequency    Min 3X/week      PT Plan Current plan remains appropriate    Co-evaluation              AM-PAC PT "6 Clicks" Daily Activity  Outcome  Measure  Difficulty turning over in bed (including adjusting bedclothes, sheets and blankets)?: A Little Difficulty moving from lying on back to sitting on the side of the bed? : A Little Difficulty sitting down on and standing up from a chair with arms (e.g., wheelchair, bedside commode, etc,.)?: A Little Help needed moving to and from a bed to chair (including a wheelchair)?: A Little Help needed walking in hospital room?: A Little Help needed climbing 3-5 steps with a railing? : A Lot 6 Click Score: 17    End of Session Equipment Utilized During Treatment: Gait belt Activity Tolerance: Patient tolerated treatment well Patient left: in chair;with call bell/phone within reach Nurse Communication: Mobility status PT Visit Diagnosis: Muscle weakness (generalized) (M62.81);Difficulty in walking, not elsewhere classified (R26.2);Unsteadiness on feet (R26.81)     Time:  9122-5834 PT Time Calculation (min) (ACUTE ONLY): 24 min  Charges:  $Gait Training: 23-37 mins                     Roney Marion, [place] Pager [phone] Office [place] 08/20/2018, 3:39 PM

## 2018-08-20 NOTE — Care Management Important Message (Signed)
Important Message  Patient Details  Name: Carol [name] MRN: [id number] Date of Birth: 1928-08-13   Medicare Important Message Given:  Yes    [name] 08/20/2018, 1:53 PM

## 2018-08-20 NOTE — Telephone Encounter (Signed)
Post ED Visit - Positive Culture Follow-up  Culture report reviewed by antimicrobial stewardship pharmacist:  []  Elenor Quinones, Pharm.D. []  Heide Guile, Pharm.D., BCPS AQ-ID []  Parks Neptune, Pharm.D., BCPS []  Alycia Rossetti, Pharm.D., BCPS []  [place], Pharm.D., BCPS, AAHIVP []  Legrand Como, Pharm.D., BCPS, AAHIVP [x]  Salome Arnt, PharmD, BCPS []  Johnnette Gourd, PharmD, BCPS []  Hughes Better, PharmD, BCPS []  Leeroy Cha, PharmD  Positive urine culture Treated with cephalexin, organism sensitive to the same and no further patient follow-up is required at this time.  Hazle Nordmann 08/20/2018, 10:12 AM

## 2018-08-20 NOTE — Discharge Summary (Signed)
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  Patient's urine culture sent home 08/17/2018 grew E. coli which was pretty pansensitive.  Patient was started on ceftriaxone at admission.  Currently she is hemodynamically stable.  She is afebrile.  Her blood cultures have been negative so far.  Her hospital course was remarkable for A. fib with RVR which resolved after increasing the dose of metoprolol. Patient is stable for discharge today with home health PT.  Following problems were addressed during her hospitalization:  #) Sepsis of unclear etiology: Currently the only relevant culture data that is shown up is a UTI growing approximately 60,000 colonies of pansensitive E. coli.  Her chest x-ray on admission showed chronic interstitial lung changes in her urine cultures have been negative.  She is defervesced on IV antibiotics. -Blood cultures , no growth to date -Urine culture from 08/17/2018 grew  60,000 colonies of E. Coli which was pansensitive -On IV ceftriaxone started 08/18/2018, antibiotics will be changed to oral on discharge.  #) Chronic atrial fibrillation complicated by rapid ventricular response:  -This morning her heart rate is controlled.  We have increased the dose of metoprolol to 50 mg  twice a day  - Continue warfarin at home at her previous dose.  #) Hypertension/hyperlipidemia:  -Restart home losartan 100 mg daily -Per above increase home metoprolol tartrate to 50 mg every 12 hours -Continue atorvastatin 20 mg nightly  #) Status post bioprosthetic AVR: -Stable  #) Warm autoimmune hemolytic anemia: This was a recent diagnosis from hematology.  She is been on prednisone for this.  Initially when she presented there was a concern that she might need stress dose steroids however at this time she clinically is improved. -Continue home prednisone 10 mg daily  #) Hypo thyroidism: -Continue levothyroxine 88 mcg  #) Pain/psych: - Continue gabapentin 200 mg 3 times daily -Continue alprazolam 0.25 mg nightly   Discharge Diagnoses:  Active Problems:   Sepsis [facility])    Discharge Instructions  Discharge Instructions    Diet - low sodium heart healthy   Complete by:  As directed    Discharge instructions   Complete by:  As directed    1)Take prescribed medications as instructed. 2)Follow up with your PCP in a week.  Do a CBC and BMP test during the follow-up. 3)Follow up with your cardiologist as an outpatient.   Increase activity slowly   Complete by:  As directed      Allergies as of 08/20/2018      Reactions   Ibuprofen Other (See Comments)   Hx of GI bleed   Nucynta [tapentadol] Other (See Comments)   Exact reaction not recalled, but knows she "cannot take"   Ace Inhibitors Cough   Doxycycline Nausea Only   Hydrocodone Nausea Only   Nsaids    Can only tolerate Tylenol (has a history of GI BLEEDS)   Adhesive [tape] Rash   Blisters (can use only paper tape)  Latex Rash, Other (See Comments)   Paper tape only      Medication List    STOP taking these medications   cephALEXin 250 MG capsule Commonly known as:  KEFLEX     TAKE these medications   ALPRAZolam 0.25 MG tablet Commonly known as:  XANAX Take 0.25 mg by mouth at bedtime.    atorvastatin 20 MG tablet Commonly known as:  LIPITOR Take 20 mg by mouth at bedtime.   BESIVANCE 0.6 % Susp Generic drug:  Besifloxacin HCl Place 1 drop into the left eye See admin instructions. Place 1 drop left eye 4 times daily for 2 days after Avastin eye injections   CALCIUM 600 + D PO Take 1 tablet by mouth every morning.   cefdinir 300 MG capsule Commonly known as:  OMNICEF Take 1 capsule (300 mg total) by mouth 2 (two) times daily for 4 days.   docusate sodium 100 MG capsule Commonly known as:  COLACE Take 100 mg by mouth at bedtime.   gabapentin 100 MG capsule Commonly known as:  NEURONTIN Take 200 mg by mouth 3 (three) times daily.   levothyroxine 88 MCG tablet Commonly known as:  SYNTHROID, LEVOTHROID Take 88 mcg by mouth daily before breakfast.   losartan 100 MG tablet Commonly known as:  COZAAR Take 100 mg by mouth daily.   metoprolol tartrate 50 MG tablet Commonly known as:  LOPRESSOR Take 0.5 tablets (25 mg total) by mouth 2 (two) times daily. What changed:  medication strength   multivitamin with minerals Tabs tablet Take 1 tablet by mouth daily.   oxybutynin 5 MG tablet Commonly known as:  DITROPAN Take 5 mg by mouth every other day.   polyethylene glycol packet Commonly known as:  MIRALAX / GLYCOLAX Take 17 g by mouth daily as needed for mild constipation.   predniSONE 5 MG tablet Commonly known as:  DELTASONE Take 10 mg by mouth daily with breakfast.   PROBIOTIC PO Take 1 capsule by mouth daily.   warfarin 5 MG tablet Commonly known as:  COUMADIN Take as directed. If you are unsure how to take this medication, talk to your nurse or doctor. Original instructions:  Take 5 mg by mouth at bedtime.   warfarin 1 MG tablet Commonly known as:  COUMADIN Take as directed. If you are unsure how to take this medication, talk to your nurse or doctor. Original instructions:  Take 1-2 mg by mouth See admin instructions. Take 1 mg by mouth at  bedtime on Sun/Mon/Wed/Fri/Sat and 2 mg on Tues and Thurs, in conjunction with one 5 mg tablet.      Follow-up Information    Carol Austin, MD. Schedule an appointment as soon as possible for a visit in 1 week(s).   Specialty:  Family Medicine Contact information: [address] [place] [address] [phone]          Allergies  Allergen Reactions  . Ibuprofen Other (See Comments)    Hx of GI bleed  . Nucynta [Tapentadol] Other (See Comments)    Exact reaction not recalled, but knows she "cannot take"  . Ace Inhibitors Cough  . Doxycycline Nausea Only  . Hydrocodone Nausea Only  . Nsaids     Can only tolerate Tylenol (has a history of GI BLEEDS)  . Adhesive [Tape] Rash    Blisters (can use only paper tape)  . Latex Rash and Other (See Comments)    Paper tape only    Consultations:  None  Procedures/Studies: Dg Chest 2 View  Result Date: 08/17/2018 CLINICAL DATA:  Recent diagnosis of UTI.  Found down today. EXAM: CHEST - 2 VIEW COMPARISON:  Chest x-ray from earlier same day. Chest x-ray dated 02/21/2012. FINDINGS: Stable cardiomegaly. Median sternotomy wires appear intact and stable in alignment. Coarse interstitial markings again noted bilaterally, stable in the short-term interval. No new lung findings. No confluent opacity to suggest a developing pneumonia. No pleural effusion or pneumothorax seen. Degenerative changes at the bilateral shoulders, at least moderate in degree. No acute or suspicious osseous finding. IMPRESSION: 1. Coarse interstitial markings again noted throughout both lungs, suspected chronic interstitial lung disease. 2. No acute findings.  No evidence of pneumonia or pulmonary edema. 3. Stable mild cardiomegaly. Electronically Signed   By: Franki Cabot M.D.   On: 08/17/2018 18:14   Dg Chest 2 View  Result Date: 08/17/2018 CLINICAL DATA:  [place] EXAM: CHEST - 2 VIEW COMPARISON:  02/21/2012 FINDINGS: Postsurgical changes of the  mediastinum. Evidence of prior valve prosthesis. Cardiomegaly. Mild diffuse increased interstitial opacity. Postsurgical changes in the left upper lobe. No pneumothorax or pleural effusion. Aortic atherosclerosis. IMPRESSION: 1. Mild diffuse increased interstitial opacity, possibly due to bronchial inflammation. No focal pulmonary infiltrate. 2. Mild cardiomegaly Electronically Signed   By: Donavan Foil M.D.   On: 08/17/2018 02:06       Subjective: Patient seen and examined the bedside this morning.  Remains comfortable.  Hemodynamically stable.  Heart rate is better controlled this morning.  Stable for discharge to home.  Discharge Exam: Vitals:   08/19/18 2115 08/20/18 0541  BP: (!) 153/66 (!) 158/90  Pulse: 66 79  Resp: 18 18  Temp: 98.1 F (36.7 C) 98.6 F (37 C)  SpO2: 100% 97%   Vitals:   08/19/18 1141 08/19/18 1816 08/19/18 2115 08/20/18 0541  BP: (!) 142/89 (!) 177/116 (!) 153/66 (!) 158/90  Pulse: 93 85 66 79  Resp: 18 18 18 18   Temp: 98.3 F (36.8 C) 97.9 F (36.6 C) 98.1 F (36.7 C) 98.6 F (37 C)  TempSrc: Oral Oral Oral Oral  SpO2: 100% 97% 100% 97%  Weight:      Height:        General: Pt is alert, awake, not in acute distress Cardiovascular: RRR, S1/S2 +, no rubs, no gallops Respiratory: CTA bilaterally, no wheezing, no rhonchi Abdominal: Soft, NT, ND, bowel sounds + Extremities: no edema, no cyanosis    The results of significant diagnostics from this hospitalization (including imaging, microbiology, ancillary and laboratory) are listed below for reference.     Microbiology: Recent Results (from the past 240 hour(s))  Blood Culture (routine x 2)     Status: None (Preliminary result)   Collection Time: 08/17/18  2:02 AM  Result Value Ref Range Status   Specimen Description   Final    BLOOD LEFT ANTECUBITAL Performed at [facility] [address] [place], [place] [address]    Special Requests   Final    BOTTLES DRAWN  AEROBIC AND ANAEROBIC Blood Culture adequate volume Performed at [facility] [address] [place], [place] [address]    Culture   Final    NO GROWTH 3 DAYS Performed at [facility], [place] [address] [place], [place] [address]    Report Status PENDING  Incomplete  Blood Culture (routine x 2)     Status: None (Preliminary result)   Collection Time: 08/17/18  2:02 AM  Result Value  Ref Range Status   Specimen Description   Final    BLOOD RIGHT ANTECUBITAL Performed at [facility] [address], [place], [place] [address]    Special Requests   Final    BOTTLES DRAWN AEROBIC AND ANAEROBIC Blood Culture adequate volume Performed at [facility] [address] [place], [place] [address]    Culture   Final    NO GROWTH 3 DAYS Performed at [facility], [place] [address] [place], [place] [address]    Report Status PENDING  Incomplete  Urine culture     Status: Abnormal   Collection Time: 08/17/18  2:02 AM  Result Value Ref Range Status   Specimen Description   Final    URINE, RANDOM Performed at [facility] [address], [place], [place] [address]    Special Requests   Final    NONE Performed at [facility], [place] [address], [place], [place] [address]    Culture 60,000 COLONIES/mL ESCHERICHIA COLI (A)  Final   Report Status 08/19/2018 FINAL  Final   Organism ID, Bacteria ESCHERICHIA COLI (A)  Final      Susceptibility   Escherichia coli - MIC*    AMPICILLIN 8 SENSITIVE Sensitive     CEFAZOLIN <=4 SENSITIVE Sensitive     CEFTRIAXONE <=1 SENSITIVE Sensitive     CIPROFLOXACIN <=0.25 SENSITIVE Sensitive     GENTAMICIN <=1 SENSITIVE Sensitive     IMIPENEM <=0.25 SENSITIVE Sensitive     NITROFURANTOIN <=16 SENSITIVE Sensitive     TRIMETH/SULFA <=20 SENSITIVE Sensitive     AMPICILLIN/SULBACTAM 4 SENSITIVE Sensitive     PIP/TAZO <=4 SENSITIVE Sensitive     Extended  ESBL NEGATIVE Sensitive     * 60,000 COLONIES/mL ESCHERICHIA COLI  Culture, blood (routine x 2)     Status: None (Preliminary result)   Collection Time: 08/18/18  2:29 AM  Result Value Ref Range Status   Specimen Description BLOOD LEFT HAND  Final   Special Requests   Final    BOTTLES DRAWN AEROBIC AND ANAEROBIC Blood Culture adequate volume   Culture   Final    NO GROWTH 2 DAYS Performed at [facility], [address], [place], [place] [address]    Report Status PENDING  Incomplete  Culture, blood (routine x 2)     Status: None (Preliminary result)   Collection Time: 08/18/18  2:34 AM  Result Value Ref Range Status   Specimen Description BLOOD RIGHT HAND  Final   Special Requests   Final    BOTTLES DRAWN AEROBIC AND ANAEROBIC Blood Culture adequate volume   Culture   Final    NO GROWTH 2 DAYS Performed at [facility], [place] [address] [place], [place] [address]    Report Status PENDING  Incomplete     Labs: BNP (last 3 results) No results for input(s): BNP in the last 8760 hours. Basic Metabolic Panel: Recent Labs  Lab 08/17/18 0202 08/17/18 1731 08/18/18 0229 08/19/18 0331 08/20/18 0313  NA 133* 130* 130* 132* 131*  K 4.2 4.0 3.6 3.7 4.2  CL 96* 99 100 100 95*  CO2 26 23 22 24 27   GLUCOSE 110* 152* 122* 137* 93  BUN 33* 19 13 15 18   CREATININE 1.04* 0.98 0.80 0.81 0.91  CALCIUM 9.6 8.9 7.9* 8.6* 8.6*  MG  --   --   --  1.8 1.9   Liver Function Tests: Recent Labs  Lab 08/17/18 0202 08/18/18 0229 08/19/18 0331  AST 178* 48* 32  ALT 123* 70* 54*  ALKPHOS 65 43 47  BILITOT 1.4* 1.2 0.8  PROT 6.4* 4.7* 5.4*  ALBUMIN 4.3 2.9* 3.1*   No results for input(s): LIPASE, AMYLASE in the last 168 hours. No results for input(s): AMMONIA in the last 168 hours. CBC: Recent Labs  Lab 08/17/18 0202 08/17/18 1731 08/18/18 0229 08/19/18 0331 08/20/18 0313  WBC 17.4* 15.2* 9.6 8.6 8.1  NEUTROABS 16.1* 13.7*  --   --   --   HGB 14.6 13.3 11.2* 12.1  11.2*  HCT 42.1 39.7 33.7* 35.4* 34.1*  MCV 95.5 99.5 97.4 95.9 97.7  PLT 210 144* 109* 123* 121*   Cardiac Enzymes: No results for input(s): CKTOTAL, CKMB, CKMBINDEX, TROPONINI in the last 168 hours. BNP: Invalid input(s): POCBNP CBG: No results for input(s): GLUCAP in the last 168 hours. D-Dimer No results for input(s): DDIMER in the last 72 hours. Hgb A1c No results for input(s): HGBA1C in the last 72 hours. Lipid Profile No results for input(s): CHOL, HDL, LDLCALC, TRIG, CHOLHDL, LDLDIRECT in the last 72 hours. Thyroid function studies Recent Labs    08/18/18 0229  TSH 1.429   Anemia work up No results for input(s): VITAMINB12, FOLATE, FERRITIN, TIBC, IRON, RETICCTPCT in the last 72 hours. Urinalysis    Component Value Date/Time   COLORURINE YELLOW 08/17/2018 0202   APPEARANCEUR CLEAR 08/17/2018 0202   LABSPEC 1.014 08/17/2018 0202   PHURINE 7.0 08/17/2018 0202   GLUCOSEU NEGATIVE 08/17/2018 0202   HGBUR NEGATIVE 08/17/2018 0202   BILIRUBINUR NEGATIVE 08/17/2018 0202   KETONESUR NEGATIVE 08/17/2018 0202   PROTEINUR 100 (A) 08/17/2018 0202   UROBILINOGEN 0.2 12/27/2011 2237   NITRITE NEGATIVE 08/17/2018 0202   LEUKOCYTESUR NEGATIVE 08/17/2018 0202   Sepsis Labs Invalid input(s): PROCALCITONIN,  WBC,  LACTICIDVEN Microbiology Recent Results (from the past 240 hour(s))  Blood Culture (routine x 2)     Status: None (Preliminary result)   Collection Time: 08/17/18  2:02 AM  Result Value Ref Range Status   Specimen Description   Final    BLOOD LEFT ANTECUBITAL Performed at [facility], [place] [address], [place], [place]  [address]    Special Requests   Final    BOTTLES DRAWN AEROBIC AND ANAEROBIC Blood Culture adequate volume Performed at [facility] [address] [place], [place] [address]    Culture   Final    NO GROWTH 3 DAYS Performed at [facility], [place] [address], [place], [place] [address]    Report Status  PENDING  Incomplete  Blood Culture (routine x 2)     Status: None (Preliminary result)   Collection Time: 08/17/18  2:02 AM  Result Value Ref Range Status   Specimen Description   Final    BLOOD RIGHT ANTECUBITAL Performed at [facility] [address], [place], [place] [address]    Special Requests   Final    BOTTLES DRAWN AEROBIC AND ANAEROBIC Blood Culture adequate volume Performed at [facility] [address], [place], [place] [address]    Culture   Final    NO GROWTH 3 DAYS Performed at [facility], [place] [address], [place], [place] [address]    Report Status PENDING  Incomplete  Urine culture     Status: Abnormal   Collection Time: 08/17/18  2:02 AM  Result Value Ref Range Status   Specimen Description  Final    URINE, RANDOM Performed at [facility], [place] [address], [place], [place] [address]    Special Requests   Final    NONE Performed at [facility], [place] [address], [place], [place] [address]    Culture 60,000 COLONIES/mL ESCHERICHIA COLI (A)  Final   Report Status 08/19/2018 FINAL  Final   Organism ID, Bacteria ESCHERICHIA COLI (A)  Final      Susceptibility   Escherichia coli - MIC*    AMPICILLIN 8 SENSITIVE Sensitive     CEFAZOLIN <=4 SENSITIVE Sensitive     CEFTRIAXONE <=1 SENSITIVE Sensitive     CIPROFLOXACIN <=0.25 SENSITIVE Sensitive     GENTAMICIN <=1 SENSITIVE Sensitive     IMIPENEM <=0.25 SENSITIVE Sensitive     NITROFURANTOIN <=16 SENSITIVE Sensitive     TRIMETH/SULFA <=20 SENSITIVE Sensitive     AMPICILLIN/SULBACTAM 4 SENSITIVE Sensitive     PIP/TAZO <=4 SENSITIVE Sensitive     Extended ESBL NEGATIVE Sensitive     * 60,000 COLONIES/mL ESCHERICHIA COLI  Culture, blood (routine x 2)     Status: None (Preliminary result)   Collection Time: 08/18/18  2:29 AM  Result Value Ref Range Status   Specimen Description BLOOD LEFT HAND  Final   Special Requests   Final     BOTTLES DRAWN AEROBIC AND ANAEROBIC Blood Culture adequate volume   Culture   Final    NO GROWTH 2 DAYS Performed at [facility], [address] [place], [place] [address]    Report Status PENDING  Incomplete  Culture, blood (routine x 2)     Status: None (Preliminary result)   Collection Time: 08/18/18  2:34 AM  Result Value Ref Range Status   Specimen Description BLOOD RIGHT HAND  Final   Special Requests   Final    BOTTLES DRAWN AEROBIC AND ANAEROBIC Blood Culture adequate volume   Culture   Final    NO GROWTH 2 DAYS Performed at [facility], [place] [address] [place], [place] [address]    Report Status PENDING  Incomplete    Please note: You were cared for by a hospitalist during your hospital stay. Once you are discharged, your primary care physician will handle any further medical issues. Please note that NO REFILLS for any discharge medications will be authorized once you are discharged, as it is imperative that you return to your primary care physician (or establish a relationship with a primary care physician if you do not have one) for your post hospital discharge needs so that they can reassess your need for medications and monitor your lab values.    Time coordinating discharge: 40 minutes  SIGNED:   Shelly Coss, MD  [facility] 08/20/2018, 9:32 AM Pager [phone]  If 7PM-7AM, please contact night-coverage [email or web address] Password [email or web address]

## 2018-08-22 LAB — CULTURE, BLOOD (ROUTINE X 2)
Culture: NO GROWTH
Culture: NO GROWTH
Special Requests: ADEQUATE
Special Requests: ADEQUATE

## 2018-08-23 LAB — CULTURE, BLOOD (ROUTINE X 2)
Culture: NO GROWTH
Culture: NO GROWTH
Special Requests: ADEQUATE
Special Requests: ADEQUATE

## 2018-08-27 ENCOUNTER — Encounter (INDEPENDENT_AMBULATORY_CARE_PROVIDER_SITE_OTHER): Payer: Medicare Other | Admitting: Ophthalmology

## 2018-08-28 DIAGNOSIS — Z7901 Long term (current) use of anticoagulants: Secondary | ICD-10-CM | POA: Diagnosis not present

## 2018-08-28 DIAGNOSIS — T148XXA Other injury of unspecified body region, initial encounter: Secondary | ICD-10-CM | POA: Diagnosis not present

## 2018-08-28 DIAGNOSIS — N390 Urinary tract infection, site not specified: Secondary | ICD-10-CM | POA: Diagnosis not present

## 2018-08-28 DIAGNOSIS — I10 Essential (primary) hypertension: Secondary | ICD-10-CM | POA: Diagnosis not present

## 2018-09-01 ENCOUNTER — Inpatient Hospital Stay: Payer: Medicare Other | Attending: Nurse Practitioner

## 2018-09-01 ENCOUNTER — Encounter: Payer: Self-pay | Admitting: Nurse Practitioner

## 2018-09-01 ENCOUNTER — Inpatient Hospital Stay (HOSPITAL_BASED_OUTPATIENT_CLINIC_OR_DEPARTMENT_OTHER): Payer: Medicare Other | Admitting: Nurse Practitioner

## 2018-09-01 ENCOUNTER — Telehealth: Payer: Self-pay | Admitting: Nurse Practitioner

## 2018-09-01 VITALS — BP 187/82 | HR 92 | Temp 98.1°F | Resp 18 | Ht 64.0 in | Wt 145.5 lb

## 2018-09-01 DIAGNOSIS — Z7952 Long term (current) use of systemic steroids: Secondary | ICD-10-CM | POA: Diagnosis not present

## 2018-09-01 DIAGNOSIS — D591 Autoimmune hemolytic anemia, unspecified: Secondary | ICD-10-CM

## 2018-09-01 DIAGNOSIS — I2510 Atherosclerotic heart disease of native coronary artery without angina pectoris: Secondary | ICD-10-CM | POA: Insufficient documentation

## 2018-09-01 DIAGNOSIS — Z79899 Other long term (current) drug therapy: Secondary | ICD-10-CM | POA: Insufficient documentation

## 2018-09-01 DIAGNOSIS — Z952 Presence of prosthetic heart valve: Secondary | ICD-10-CM | POA: Insufficient documentation

## 2018-09-01 DIAGNOSIS — E039 Hypothyroidism, unspecified: Secondary | ICD-10-CM | POA: Diagnosis not present

## 2018-09-01 DIAGNOSIS — Z7901 Long term (current) use of anticoagulants: Secondary | ICD-10-CM | POA: Insufficient documentation

## 2018-09-01 DIAGNOSIS — E785 Hyperlipidemia, unspecified: Secondary | ICD-10-CM | POA: Diagnosis not present

## 2018-09-01 LAB — CBC WITH DIFFERENTIAL (CANCER CENTER ONLY)
Basophils Absolute: 0 10*3/uL (ref 0.0–0.1)
Basophils Relative: 0 %
Eosinophils Absolute: 0 10*3/uL (ref 0.0–0.5)
Eosinophils Relative: 0 %
HCT: 40.1 % (ref 34.8–46.6)
Hemoglobin: 13.8 g/dL (ref 11.6–15.9)
Lymphocytes Relative: 8 %
Lymphs Abs: 0.8 10*3/uL — ABNORMAL LOW (ref 0.9–3.3)
MCH: 31.9 pg (ref 25.1–34.0)
MCHC: 34.4 g/dL (ref 31.5–36.0)
MCV: 92.8 fL (ref 79.5–101.0)
Monocytes Absolute: 0.5 10*3/uL (ref 0.1–0.9)
Monocytes Relative: 5 %
Neutro Abs: 9.1 10*3/uL — ABNORMAL HIGH (ref 1.5–6.5)
Neutrophils Relative %: 87 %
Platelet Count: 194 10*3/uL (ref 145–400)
RBC: 4.32 MIL/uL (ref 3.70–5.45)
RDW: 14.8 % — ABNORMAL HIGH (ref 11.2–14.5)
WBC Count: 10.5 10*3/uL — ABNORMAL HIGH (ref 3.9–10.3)

## 2018-09-01 LAB — RETICULOCYTES
RBC.: 4.32 MIL/uL (ref 3.70–5.45)
Retic Count, Absolute: 181.4 10*3/uL — ABNORMAL HIGH (ref 33.7–90.7)
Retic Ct Pct: 4.2 % — ABNORMAL HIGH (ref 0.7–2.1)

## 2018-09-01 NOTE — Progress Notes (Signed)
  Carol [name]   Diagnosis: Autoimmune hemolytic anemia  INTERVAL HISTORY:   Carol [name] returns as scheduled.  She continues prednisone 10 mg daily.  She was hospitalized 08/17/2018 through 08/20/2018 with sepsis of unclear etiology.  Urine culture returned positive for E. coli.  Blood cultures negative.  Chest x-ray negative for evidence of pneumonia.  She feels she is slowly regaining her strength since the hospitalization.  She denies urinary symptoms.  No fever since hospital discharge.  She denies any bleeding.  She continues to [name] an increased appetite and difficulty sleeping which she attributes to prednisone.  Objective:  Vital signs in last 24 hours:  Blood pressure (!) 187/82, pulse 92, temperature 98.1 F (36.7 C), temperature source Oral, resp. rate 18, height 5\' 4"  (1.626 m), weight 145 lb 8 oz (66 kg), SpO2 97 %.    HEENT: No thrush or ulcers. Resp: Lungs clear bilaterally. Cardio: Irregular. GI: Abdomen soft and nontender.  No hepatospleno megaly. Vascular: Trace edema at the lower legs bilaterally. Neuro: Alert and oriented. Skin: Abrasion/superficial ulcerations at the lower legs bilaterally.   Lab Results:  Lab Results  Component Value Date   WBC 10.5 (H) 09/01/2018   HGB 13.8 09/01/2018   HCT 40.1 09/01/2018   MCV 92.8 09/01/2018   PLT 194 09/01/2018   NEUTROABS 9.1 (H) 09/01/2018    Imaging:  No results found.  Medications: I have reviewed the patient's current medications.  Assessment/Plan: 1. Autoimmune hemolytic anemia, status post 2 units of packed red blood cells 05/14/2018, prednisone 05/15/2018, Solu-Medrol started 05/16/2018;prednisone 60 mg daily beginning 05/19/2018  Prednisone taper to 40 mg daily beginning 05/23/2018  Prednisone taper to 30 mg daily beginning 05/30/2018  Prednisone taper to 20 mg daily beginning 06/13/2018  Prednisone taper to 15 mg daily beginning 06/28/2018  Prednisone taper to 10  mg daily beginning 07/23/2018  Prednisone taper to 5 mg daily beginning 09/01/2018  2. History of coronary artery disease  3. Status post aortic valve replacement, maintained on Coumadin  4. Hyperlipidemia  5. Hypothyroidism  6.   Hospitalization 08/17/2018 through 08/20/2018 with E. coli UTI  Disposition: Ms. Morton appears stable.  We reviewed the CBC from today.  Hemoglobin is in normal range.  She will taper prednisone from 10 mg daily to 5 mg daily.  She will return for lab and follow-up in 3 weeks.  She will contact the office in the interim with any problems.  Plan reviewed with Dr. Benay Spice.    [name] ANP/GNP-BC   09/01/2018  10:51 AM

## 2018-09-01 NOTE — Telephone Encounter (Signed)
Pt scheduled 10/1 due to pt requesting a later appt time.

## 2018-09-05 ENCOUNTER — Encounter (INDEPENDENT_AMBULATORY_CARE_PROVIDER_SITE_OTHER): Payer: Medicare Other | Admitting: Ophthalmology

## 2018-09-05 DIAGNOSIS — L519 Erythema multiforme, unspecified: Secondary | ICD-10-CM | POA: Diagnosis not present

## 2018-09-10 DIAGNOSIS — R29898 Other symptoms and signs involving the musculoskeletal system: Secondary | ICD-10-CM | POA: Diagnosis not present

## 2018-09-10 DIAGNOSIS — R2681 Unsteadiness on feet: Secondary | ICD-10-CM | POA: Diagnosis not present

## 2018-09-10 DIAGNOSIS — R2689 Other abnormalities of gait and mobility: Secondary | ICD-10-CM | POA: Diagnosis not present

## 2018-09-10 DIAGNOSIS — M6281 Muscle weakness (generalized): Secondary | ICD-10-CM | POA: Diagnosis not present

## 2018-09-11 DIAGNOSIS — I4891 Unspecified atrial fibrillation: Secondary | ICD-10-CM | POA: Diagnosis not present

## 2018-09-11 DIAGNOSIS — R2681 Unsteadiness on feet: Secondary | ICD-10-CM | POA: Diagnosis not present

## 2018-09-11 DIAGNOSIS — R29898 Other symptoms and signs involving the musculoskeletal system: Secondary | ICD-10-CM | POA: Diagnosis not present

## 2018-09-11 DIAGNOSIS — R2689 Other abnormalities of gait and mobility: Secondary | ICD-10-CM | POA: Diagnosis not present

## 2018-09-11 DIAGNOSIS — Z7901 Long term (current) use of anticoagulants: Secondary | ICD-10-CM | POA: Diagnosis not present

## 2018-09-11 DIAGNOSIS — M6281 Muscle weakness (generalized): Secondary | ICD-10-CM | POA: Diagnosis not present

## 2018-09-15 DIAGNOSIS — R21 Rash and other nonspecific skin eruption: Secondary | ICD-10-CM | POA: Diagnosis not present

## 2018-09-15 DIAGNOSIS — Z7901 Long term (current) use of anticoagulants: Secondary | ICD-10-CM | POA: Diagnosis not present

## 2018-09-16 ENCOUNTER — Encounter (INDEPENDENT_AMBULATORY_CARE_PROVIDER_SITE_OTHER): Payer: Medicare Other | Admitting: Ophthalmology

## 2018-09-16 DIAGNOSIS — H43813 Vitreous degeneration, bilateral: Secondary | ICD-10-CM

## 2018-09-16 DIAGNOSIS — H35033 Hypertensive retinopathy, bilateral: Secondary | ICD-10-CM | POA: Diagnosis not present

## 2018-09-16 DIAGNOSIS — H3411 Central retinal artery occlusion, right eye: Secondary | ICD-10-CM

## 2018-09-16 DIAGNOSIS — H34812 Central retinal vein occlusion, left eye, with macular edema: Secondary | ICD-10-CM

## 2018-09-16 DIAGNOSIS — I10 Essential (primary) hypertension: Secondary | ICD-10-CM

## 2018-09-17 DIAGNOSIS — R2689 Other abnormalities of gait and mobility: Secondary | ICD-10-CM | POA: Diagnosis not present

## 2018-09-17 DIAGNOSIS — R29898 Other symptoms and signs involving the musculoskeletal system: Secondary | ICD-10-CM | POA: Diagnosis not present

## 2018-09-17 DIAGNOSIS — M6281 Muscle weakness (generalized): Secondary | ICD-10-CM | POA: Diagnosis not present

## 2018-09-17 DIAGNOSIS — R2681 Unsteadiness on feet: Secondary | ICD-10-CM | POA: Diagnosis not present

## 2018-09-18 DIAGNOSIS — Z7901 Long term (current) use of anticoagulants: Secondary | ICD-10-CM | POA: Diagnosis not present

## 2018-09-22 ENCOUNTER — Encounter (INDEPENDENT_AMBULATORY_CARE_PROVIDER_SITE_OTHER): Payer: Medicare Other | Admitting: Ophthalmology

## 2018-09-22 DIAGNOSIS — Z7901 Long term (current) use of anticoagulants: Secondary | ICD-10-CM | POA: Diagnosis not present

## 2018-09-23 ENCOUNTER — Inpatient Hospital Stay: Payer: Medicare Other | Attending: Nurse Practitioner

## 2018-09-23 ENCOUNTER — Inpatient Hospital Stay (HOSPITAL_BASED_OUTPATIENT_CLINIC_OR_DEPARTMENT_OTHER): Payer: Medicare Other | Admitting: Nurse Practitioner

## 2018-09-23 ENCOUNTER — Encounter: Payer: Self-pay | Admitting: Nurse Practitioner

## 2018-09-23 ENCOUNTER — Telehealth: Payer: Self-pay | Admitting: Oncology

## 2018-09-23 VITALS — BP 163/71 | HR 76 | Temp 97.5°F | Resp 18 | Ht 64.0 in | Wt 145.8 lb

## 2018-09-23 DIAGNOSIS — Z952 Presence of prosthetic heart valve: Secondary | ICD-10-CM | POA: Insufficient documentation

## 2018-09-23 DIAGNOSIS — D591 Autoimmune hemolytic anemia, unspecified: Secondary | ICD-10-CM

## 2018-09-23 DIAGNOSIS — I2510 Atherosclerotic heart disease of native coronary artery without angina pectoris: Secondary | ICD-10-CM | POA: Diagnosis not present

## 2018-09-23 DIAGNOSIS — E039 Hypothyroidism, unspecified: Secondary | ICD-10-CM | POA: Diagnosis not present

## 2018-09-23 DIAGNOSIS — E785 Hyperlipidemia, unspecified: Secondary | ICD-10-CM | POA: Diagnosis not present

## 2018-09-23 DIAGNOSIS — R21 Rash and other nonspecific skin eruption: Secondary | ICD-10-CM | POA: Diagnosis not present

## 2018-09-23 DIAGNOSIS — Z7952 Long term (current) use of systemic steroids: Secondary | ICD-10-CM | POA: Diagnosis not present

## 2018-09-23 DIAGNOSIS — Z7901 Long term (current) use of anticoagulants: Secondary | ICD-10-CM | POA: Diagnosis not present

## 2018-09-23 LAB — CBC WITH DIFFERENTIAL (CANCER CENTER ONLY)
Basophils Absolute: 0 10*3/uL (ref 0.0–0.1)
Basophils Relative: 0 %
Eosinophils Absolute: 0 10*3/uL (ref 0.0–0.5)
Eosinophils Relative: 0 %
HCT: 41.7 % (ref 34.8–46.6)
Hemoglobin: 14.2 g/dL (ref 11.6–15.9)
Lymphocytes Relative: 5 %
Lymphs Abs: 0.5 10*3/uL — ABNORMAL LOW (ref 0.9–3.3)
MCH: 31.8 pg (ref 25.1–34.0)
MCHC: 34.1 g/dL (ref 31.5–36.0)
MCV: 93.3 fL (ref 79.5–101.0)
Monocytes Absolute: 0.1 10*3/uL (ref 0.1–0.9)
Monocytes Relative: 1 %
Neutro Abs: 10.7 10*3/uL — ABNORMAL HIGH (ref 1.5–6.5)
Neutrophils Relative %: 94 %
Platelet Count: 175 10*3/uL (ref 145–400)
RBC: 4.47 MIL/uL (ref 3.70–5.45)
RDW: 15 % — ABNORMAL HIGH (ref 11.2–14.5)
WBC Count: 11.4 10*3/uL — ABNORMAL HIGH (ref 3.9–10.3)

## 2018-09-23 LAB — RETICULOCYTES
RBC.: 4.47 MIL/uL (ref 3.70–5.45)
Retic Count, Absolute: 214.6 10*3/uL — ABNORMAL HIGH (ref 33.7–90.7)
Retic Ct Pct: 4.8 % — ABNORMAL HIGH (ref 0.7–2.1)

## 2018-09-23 NOTE — Telephone Encounter (Signed)
Appts scheduled avs/calendar printed per 10/1 los

## 2018-09-23 NOTE — Progress Notes (Signed)
  [facility] OFFICE PROGRESS NOTE   Diagnosis: Autoimmune hemolytic anemia  INTERVAL HISTORY:   Carol [name] returns as scheduled.  Prednisone was tapered to 5 mg daily following office visit 09/01/2018.  She reports developing a rash since her last visit here.  The rash is felt to be secondary to a medication, possibly an antibiotic.  Prednisone dose was increased on 09/16/2018 and she is now completing a prednisone taper.  She reports the rash is much better, no longer pruritic.  She notes an increase in appetite and trouble sleeping since the prednisone dose was increased.  Objective:  Vital signs in last 24 hours:  Blood pressure (!) 163/71, pulse 76, temperature (!) 97.5 F (36.4 C), temperature source Oral, resp. rate 18, height 5\' 4"  (1.626 m), weight 145 lb 12.8 oz (66.1 kg), SpO2 98 %.    HEENT: No thrush or ulcers. Resp: Lungs clear bilaterally. Cardio: Regular rate and rhythm. GI: Abdomen soft and nontender.  No hepatosplenomegaly. Vascular: Trace edema at the lower legs bilaterally. Skin: Healing abrasions at the lower legs bilaterally.  Faint erythematous rash at the mid to low back.   Lab Results:  Lab Results  Component Value Date   WBC 11.4 (H) 09/23/2018   HGB 14.2 09/23/2018   HCT 41.7 09/23/2018   MCV 93.3 09/23/2018   PLT 175 09/23/2018   NEUTROABS 10.7 (H) 09/23/2018    Imaging:  No results found.  Medications: I have reviewed the patient's current medications.  Assessment/Plan: 1. Autoimmune hemolytic anemia, status post 2 units of packed red blood cells 05/14/2018, prednisone 05/15/2018, Solu-Medrol started 05/16/2018;prednisone 60 mg daily beginning 05/19/2018  Prednisone taper to 40 mg daily beginning 05/23/2018  Prednisone taper to 30 mg daily beginning 05/30/2018  Prednisone taper to 20 mg daily beginning 06/13/2018  Prednisone taper to 15 mg daily beginning 06/28/2018  Prednisone taper to 10 mg daily beginning  07/23/2018  Prednisone taper to 5 mg daily beginning 09/01/2018  Prednisone dose increased 09/16/2018 due to a rash, now completing a taper  2. History of coronary artery disease  3. Status post aortic valve replacement, maintained on Coumadin  4. Hyperlipidemia  5. Hypothyroidism  6.   Hospitalization 08/17/2018 through 08/20/2018 with E. coli UTI  Disposition: Carol [name] appears unchanged.  Hemoglobin remains in normal range.  She is currently on a higher dose of prednisone due to a recent rash.  She has a taper schedule and will complete later this week.  When she completes the prednisone taper as per her PCP she will resume prednisone 5 mg daily.  She will return for a follow-up visit and CBC in 3 weeks.   Plan reviewed with Dr. Benay Spice.   [name] ANP/GNP-BC   09/23/2018  2:12 PM

## 2018-09-24 DIAGNOSIS — R2689 Other abnormalities of gait and mobility: Secondary | ICD-10-CM | POA: Diagnosis not present

## 2018-09-24 DIAGNOSIS — I4819 Other persistent atrial fibrillation: Secondary | ICD-10-CM | POA: Diagnosis not present

## 2018-09-24 DIAGNOSIS — M6281 Muscle weakness (generalized): Secondary | ICD-10-CM | POA: Diagnosis not present

## 2018-09-24 DIAGNOSIS — R2681 Unsteadiness on feet: Secondary | ICD-10-CM | POA: Diagnosis not present

## 2018-09-25 DIAGNOSIS — E039 Hypothyroidism, unspecified: Secondary | ICD-10-CM | POA: Diagnosis not present

## 2018-09-25 DIAGNOSIS — Z7901 Long term (current) use of anticoagulants: Secondary | ICD-10-CM | POA: Diagnosis not present

## 2018-09-25 DIAGNOSIS — Z23 Encounter for immunization: Secondary | ICD-10-CM | POA: Diagnosis not present

## 2018-09-25 DIAGNOSIS — F419 Anxiety disorder, unspecified: Secondary | ICD-10-CM | POA: Diagnosis not present

## 2018-09-25 DIAGNOSIS — I10 Essential (primary) hypertension: Secondary | ICD-10-CM | POA: Diagnosis not present

## 2018-09-25 DIAGNOSIS — I480 Paroxysmal atrial fibrillation: Secondary | ICD-10-CM | POA: Diagnosis not present

## 2018-09-25 DIAGNOSIS — D692 Other nonthrombocytopenic purpura: Secondary | ICD-10-CM | POA: Diagnosis not present

## 2018-09-30 DIAGNOSIS — Z85828 Personal history of other malignant neoplasm of skin: Secondary | ICD-10-CM | POA: Diagnosis not present

## 2018-09-30 DIAGNOSIS — L500 Allergic urticaria: Secondary | ICD-10-CM | POA: Diagnosis not present

## 2018-09-30 DIAGNOSIS — R21 Rash and other nonspecific skin eruption: Secondary | ICD-10-CM | POA: Diagnosis not present

## 2018-10-01 DIAGNOSIS — Z7901 Long term (current) use of anticoagulants: Secondary | ICD-10-CM | POA: Diagnosis not present

## 2018-10-01 DIAGNOSIS — I4819 Other persistent atrial fibrillation: Secondary | ICD-10-CM | POA: Diagnosis not present

## 2018-10-01 DIAGNOSIS — R2689 Other abnormalities of gait and mobility: Secondary | ICD-10-CM | POA: Diagnosis not present

## 2018-10-01 DIAGNOSIS — M6281 Muscle weakness (generalized): Secondary | ICD-10-CM | POA: Diagnosis not present

## 2018-10-01 DIAGNOSIS — I4891 Unspecified atrial fibrillation: Secondary | ICD-10-CM | POA: Diagnosis not present

## 2018-10-01 DIAGNOSIS — R2681 Unsteadiness on feet: Secondary | ICD-10-CM | POA: Diagnosis not present

## 2018-10-06 DIAGNOSIS — I4891 Unspecified atrial fibrillation: Secondary | ICD-10-CM | POA: Diagnosis not present

## 2018-10-06 DIAGNOSIS — Z23 Encounter for immunization: Secondary | ICD-10-CM | POA: Diagnosis not present

## 2018-10-06 DIAGNOSIS — Z7901 Long term (current) use of anticoagulants: Secondary | ICD-10-CM | POA: Diagnosis not present

## 2018-10-07 DIAGNOSIS — R2681 Unsteadiness on feet: Secondary | ICD-10-CM | POA: Diagnosis not present

## 2018-10-07 DIAGNOSIS — M6281 Muscle weakness (generalized): Secondary | ICD-10-CM | POA: Diagnosis not present

## 2018-10-07 DIAGNOSIS — I4819 Other persistent atrial fibrillation: Secondary | ICD-10-CM | POA: Diagnosis not present

## 2018-10-07 DIAGNOSIS — R2689 Other abnormalities of gait and mobility: Secondary | ICD-10-CM | POA: Diagnosis not present

## 2018-10-09 DIAGNOSIS — R2681 Unsteadiness on feet: Secondary | ICD-10-CM | POA: Diagnosis not present

## 2018-10-09 DIAGNOSIS — R2689 Other abnormalities of gait and mobility: Secondary | ICD-10-CM | POA: Diagnosis not present

## 2018-10-09 DIAGNOSIS — I4891 Unspecified atrial fibrillation: Secondary | ICD-10-CM | POA: Diagnosis not present

## 2018-10-09 DIAGNOSIS — I4819 Other persistent atrial fibrillation: Secondary | ICD-10-CM | POA: Diagnosis not present

## 2018-10-09 DIAGNOSIS — Z7901 Long term (current) use of anticoagulants: Secondary | ICD-10-CM | POA: Diagnosis not present

## 2018-10-09 DIAGNOSIS — M6281 Muscle weakness (generalized): Secondary | ICD-10-CM | POA: Diagnosis not present

## 2018-10-13 ENCOUNTER — Encounter (INDEPENDENT_AMBULATORY_CARE_PROVIDER_SITE_OTHER): Payer: Medicare Other | Admitting: Ophthalmology

## 2018-10-14 ENCOUNTER — Encounter: Payer: Self-pay | Admitting: Oncology

## 2018-10-14 ENCOUNTER — Inpatient Hospital Stay (HOSPITAL_BASED_OUTPATIENT_CLINIC_OR_DEPARTMENT_OTHER): Payer: Medicare Other | Admitting: Oncology

## 2018-10-14 ENCOUNTER — Inpatient Hospital Stay: Payer: Medicare Other

## 2018-10-14 VITALS — BP 162/84 | HR 77 | Temp 97.6°F | Resp 16 | Ht 64.0 in | Wt 147.7 lb

## 2018-10-14 DIAGNOSIS — D591 Autoimmune hemolytic anemia, unspecified: Secondary | ICD-10-CM

## 2018-10-14 DIAGNOSIS — E039 Hypothyroidism, unspecified: Secondary | ICD-10-CM

## 2018-10-14 DIAGNOSIS — Z79899 Other long term (current) drug therapy: Secondary | ICD-10-CM | POA: Diagnosis not present

## 2018-10-14 DIAGNOSIS — R21 Rash and other nonspecific skin eruption: Secondary | ICD-10-CM | POA: Diagnosis not present

## 2018-10-14 DIAGNOSIS — Z7952 Long term (current) use of systemic steroids: Secondary | ICD-10-CM

## 2018-10-14 DIAGNOSIS — I2510 Atherosclerotic heart disease of native coronary artery without angina pectoris: Secondary | ICD-10-CM

## 2018-10-14 DIAGNOSIS — I4891 Unspecified atrial fibrillation: Secondary | ICD-10-CM | POA: Diagnosis not present

## 2018-10-14 DIAGNOSIS — Z952 Presence of prosthetic heart valve: Secondary | ICD-10-CM | POA: Diagnosis not present

## 2018-10-14 DIAGNOSIS — Z7901 Long term (current) use of anticoagulants: Secondary | ICD-10-CM

## 2018-10-14 DIAGNOSIS — E785 Hyperlipidemia, unspecified: Secondary | ICD-10-CM | POA: Diagnosis not present

## 2018-10-14 LAB — CBC WITH DIFFERENTIAL (CANCER CENTER ONLY)
Abs Immature Granulocytes: 0.05 10*3/uL (ref 0.00–0.07)
Basophils Absolute: 0 10*3/uL (ref 0.0–0.1)
Basophils Relative: 0 %
Eosinophils Absolute: 0.1 10*3/uL (ref 0.0–0.5)
Eosinophils Relative: 1 %
HCT: 42.4 % (ref 36.0–46.0)
Hemoglobin: 14.2 g/dL (ref 12.0–15.0)
Immature Granulocytes: 1 %
Lymphocytes Relative: 7 %
Lymphs Abs: 0.7 10*3/uL (ref 0.7–4.0)
MCH: 31.2 pg (ref 26.0–34.0)
MCHC: 33.5 g/dL (ref 30.0–36.0)
MCV: 93.2 fL (ref 80.0–100.0)
Monocytes Absolute: 0.5 10*3/uL (ref 0.1–1.0)
Monocytes Relative: 5 %
Neutro Abs: 9.5 10*3/uL — ABNORMAL HIGH (ref 1.7–7.7)
Neutrophils Relative %: 86 %
Platelet Count: 170 10*3/uL (ref 150–400)
RBC: 4.55 MIL/uL (ref 3.87–5.11)
RDW: 14.9 % (ref 11.5–15.5)
WBC Count: 10.8 10*3/uL — ABNORMAL HIGH (ref 4.0–10.5)
nRBC: 0 % (ref 0.0–0.2)

## 2018-10-14 LAB — RETICULOCYTES
Immature Retic Fract: 10.8 % (ref 2.3–15.9)
RBC.: 4.55 MIL/uL (ref 3.87–5.11)
Retic Count, Absolute: 187.5 10*3/uL — ABNORMAL HIGH (ref 19.0–186.0)
Retic Ct Pct: 4.1 % — ABNORMAL HIGH (ref 0.4–3.1)

## 2018-10-14 MED ORDER — PREDNISONE 2.5 MG PO TABS: ORAL_TABLET | ORAL | 0 refills | Status: DC

## 2018-10-14 NOTE — Progress Notes (Signed)
  [facility] OFFICE PROGRESS NOTE   Diagnosis: Autoimmune hemolytic anemia  INTERVAL HISTORY:   Ms. Tramel returns as scheduled.  She continues prednisone at a dose of 5 mg daily.  She saw Dr. Ronnald Ramp for evaluation of a rash..  She took prednisone at a dose of 20 mg daily for approximately 2 weeks and has been maintained on 10 mg daily for the past week.  The rash resolved.  She has decreased energy.  No other complaint.  Objective:  Vital signs in last 24 hours:  Blood pressure (!) 162/84, pulse 77, temperature 97.6 F (36.4 C), temperature source Oral, resp. rate 16, height 5\' 4"  (1.626 m), weight 147 lb 11.2 oz (67 kg), SpO2 98 %.    HEENT: No thrush Resp: Lungs clear bilaterally Cardio: Irregular GI: No hepatosplenomegaly, nontender Vascular: No leg edema  Skin: Thinning with proper at the lower legs bilaterally, small ecchymosis at the mid upper abdomen   Lab Results:  Lab Results  Component Value Date   WBC 10.8 (H) 10/14/2018   HGB 14.2 10/14/2018   HCT 42.4 10/14/2018   MCV 93.2 10/14/2018   PLT 170 10/14/2018   NEUTROABS 9.5 (H) 10/14/2018      Medications: I have reviewed the patient's current medications.   Assessment/Plan: 1. Autoimmune hemolytic anemia, status post 2 units of packed red blood cells 05/14/2018, prednisone 05/15/2018, Solu-Medrol started 05/16/2018;prednisone 60 mg daily beginning 05/19/2018  Prednisone taper to 40 mg daily beginning 05/23/2018  Prednisone taper to 30 mg daily beginning 05/30/2018  Prednisone taper to 20 mg daily beginning 06/13/2018  Prednisone taper to 15 mg daily beginning 06/28/2018  Prednisone taper to 10 mg daily beginning 07/23/2018  Prednisone taper to 5 mg daily beginning 09/01/2018  Prednisone dose increased 09/16/2018 due to a rash, 20 mg for 2 weeks followed by 10 mg per dermatology  Prednisone decreased to 5 mg daily beginning 10/14/2018  2. History of coronary artery disease  3. Status  post aortic valve replacement, maintained on Coumadin  4. Hyperlipidemia  5. Hypothyroidism  6.Hospitalization 08/17/2018 through 08/20/2018 with E. coli UTI    Disposition: She appears stable.  The hemoglobin remains in the normal range.  The reticulocyte count mains elevated suggesting an ongoing degree of hemolysis.  We tapered the prednisone to 5 mg daily.  She will return for office and lab visit in 3 weeks.  15 minutes were spent with the patient today.  The majority of the time was used for counseling and coordination of care.  Betsy Coder, MD  10/14/2018  11:38 AM

## 2018-10-15 ENCOUNTER — Encounter (INDEPENDENT_AMBULATORY_CARE_PROVIDER_SITE_OTHER): Payer: Medicare Other | Admitting: Ophthalmology

## 2018-10-15 DIAGNOSIS — R2681 Unsteadiness on feet: Secondary | ICD-10-CM | POA: Diagnosis not present

## 2018-10-15 DIAGNOSIS — I10 Essential (primary) hypertension: Secondary | ICD-10-CM

## 2018-10-15 DIAGNOSIS — H35033 Hypertensive retinopathy, bilateral: Secondary | ICD-10-CM

## 2018-10-15 DIAGNOSIS — I4819 Other persistent atrial fibrillation: Secondary | ICD-10-CM | POA: Diagnosis not present

## 2018-10-15 DIAGNOSIS — H43813 Vitreous degeneration, bilateral: Secondary | ICD-10-CM | POA: Diagnosis not present

## 2018-10-15 DIAGNOSIS — H3411 Central retinal artery occlusion, right eye: Secondary | ICD-10-CM

## 2018-10-15 DIAGNOSIS — R2689 Other abnormalities of gait and mobility: Secondary | ICD-10-CM | POA: Diagnosis not present

## 2018-10-15 DIAGNOSIS — H34812 Central retinal vein occlusion, left eye, with macular edema: Secondary | ICD-10-CM

## 2018-10-15 DIAGNOSIS — M6281 Muscle weakness (generalized): Secondary | ICD-10-CM | POA: Diagnosis not present

## 2018-10-16 DIAGNOSIS — Z7901 Long term (current) use of anticoagulants: Secondary | ICD-10-CM | POA: Diagnosis not present

## 2018-10-16 DIAGNOSIS — I4891 Unspecified atrial fibrillation: Secondary | ICD-10-CM | POA: Diagnosis not present

## 2018-10-17 DIAGNOSIS — M6281 Muscle weakness (generalized): Secondary | ICD-10-CM | POA: Diagnosis not present

## 2018-10-17 DIAGNOSIS — I4819 Other persistent atrial fibrillation: Secondary | ICD-10-CM | POA: Diagnosis not present

## 2018-10-17 DIAGNOSIS — R2689 Other abnormalities of gait and mobility: Secondary | ICD-10-CM | POA: Diagnosis not present

## 2018-10-17 DIAGNOSIS — R2681 Unsteadiness on feet: Secondary | ICD-10-CM | POA: Diagnosis not present

## 2018-10-20 ENCOUNTER — Telehealth: Payer: Self-pay | Admitting: Emergency Medicine

## 2018-10-20 NOTE — Telephone Encounter (Signed)
Patient c/o of rash on her back, buttocks and arms. Per Dr.Sherrill patient should continue taking 5mg  prednisone daily and call her skin doctor or primary doctor regarding this. She voiced understanding.

## 2018-10-22 DIAGNOSIS — I4819 Other persistent atrial fibrillation: Secondary | ICD-10-CM | POA: Diagnosis not present

## 2018-10-22 DIAGNOSIS — R2689 Other abnormalities of gait and mobility: Secondary | ICD-10-CM | POA: Diagnosis not present

## 2018-10-22 DIAGNOSIS — M6281 Muscle weakness (generalized): Secondary | ICD-10-CM | POA: Diagnosis not present

## 2018-10-22 DIAGNOSIS — R2681 Unsteadiness on feet: Secondary | ICD-10-CM | POA: Diagnosis not present

## 2018-10-23 DIAGNOSIS — L509 Urticaria, unspecified: Secondary | ICD-10-CM | POA: Diagnosis not present

## 2018-10-23 DIAGNOSIS — L98499 Non-pressure chronic ulcer of skin of other sites with unspecified severity: Secondary | ICD-10-CM | POA: Diagnosis not present

## 2018-10-23 DIAGNOSIS — Z7901 Long term (current) use of anticoagulants: Secondary | ICD-10-CM | POA: Diagnosis not present

## 2018-10-23 DIAGNOSIS — Z85828 Personal history of other malignant neoplasm of skin: Secondary | ICD-10-CM | POA: Diagnosis not present

## 2018-10-23 DIAGNOSIS — I4891 Unspecified atrial fibrillation: Secondary | ICD-10-CM | POA: Diagnosis not present

## 2018-10-29 DIAGNOSIS — R2681 Unsteadiness on feet: Secondary | ICD-10-CM | POA: Diagnosis not present

## 2018-10-29 DIAGNOSIS — R29898 Other symptoms and signs involving the musculoskeletal system: Secondary | ICD-10-CM | POA: Diagnosis not present

## 2018-10-29 DIAGNOSIS — I4819 Other persistent atrial fibrillation: Secondary | ICD-10-CM | POA: Diagnosis not present

## 2018-10-29 DIAGNOSIS — M6281 Muscle weakness (generalized): Secondary | ICD-10-CM | POA: Diagnosis not present

## 2018-11-04 ENCOUNTER — Telehealth: Payer: Self-pay

## 2018-11-04 ENCOUNTER — Encounter: Payer: Self-pay | Admitting: Nurse Practitioner

## 2018-11-04 ENCOUNTER — Inpatient Hospital Stay (HOSPITAL_BASED_OUTPATIENT_CLINIC_OR_DEPARTMENT_OTHER): Payer: Medicare Other | Admitting: Nurse Practitioner

## 2018-11-04 ENCOUNTER — Inpatient Hospital Stay: Payer: Medicare Other | Attending: Nurse Practitioner

## 2018-11-04 VITALS — BP 165/73 | HR 68 | Temp 97.6°F | Resp 17 | Ht 64.0 in | Wt 145.4 lb

## 2018-11-04 DIAGNOSIS — E039 Hypothyroidism, unspecified: Secondary | ICD-10-CM | POA: Insufficient documentation

## 2018-11-04 DIAGNOSIS — D591 Autoimmune hemolytic anemia, unspecified: Secondary | ICD-10-CM

## 2018-11-04 DIAGNOSIS — Z7901 Long term (current) use of anticoagulants: Secondary | ICD-10-CM | POA: Insufficient documentation

## 2018-11-04 DIAGNOSIS — E785 Hyperlipidemia, unspecified: Secondary | ICD-10-CM | POA: Insufficient documentation

## 2018-11-04 DIAGNOSIS — Z952 Presence of prosthetic heart valve: Secondary | ICD-10-CM | POA: Diagnosis not present

## 2018-11-04 DIAGNOSIS — L299 Pruritus, unspecified: Secondary | ICD-10-CM | POA: Diagnosis not present

## 2018-11-04 DIAGNOSIS — Z79899 Other long term (current) drug therapy: Secondary | ICD-10-CM

## 2018-11-04 DIAGNOSIS — Z87440 Personal history of urinary (tract) infections: Secondary | ICD-10-CM | POA: Insufficient documentation

## 2018-11-04 DIAGNOSIS — I2510 Atherosclerotic heart disease of native coronary artery without angina pectoris: Secondary | ICD-10-CM | POA: Diagnosis not present

## 2018-11-04 LAB — CBC WITH DIFFERENTIAL (CANCER CENTER ONLY)
Abs Immature Granulocytes: 0.04 10*3/uL (ref 0.00–0.07)
Basophils Absolute: 0 10*3/uL (ref 0.0–0.1)
Basophils Relative: 0 %
Eosinophils Absolute: 0.1 10*3/uL (ref 0.0–0.5)
Eosinophils Relative: 1 %
HCT: 41.2 % (ref 36.0–46.0)
Hemoglobin: 14 g/dL (ref 12.0–15.0)
Immature Granulocytes: 0 %
Lymphocytes Relative: 10 %
Lymphs Abs: 0.9 10*3/uL (ref 0.7–4.0)
MCH: 31.1 pg (ref 26.0–34.0)
MCHC: 34 g/dL (ref 30.0–36.0)
MCV: 91.6 fL (ref 80.0–100.0)
Monocytes Absolute: 0.5 10*3/uL (ref 0.1–1.0)
Monocytes Relative: 6 %
Neutro Abs: 7.6 10*3/uL (ref 1.7–7.7)
Neutrophils Relative %: 83 %
Platelet Count: 174 10*3/uL (ref 150–400)
RBC: 4.5 MIL/uL (ref 3.87–5.11)
RDW: 14.8 % (ref 11.5–15.5)
WBC Count: 9.1 10*3/uL (ref 4.0–10.5)
nRBC: 0 % (ref 0.0–0.2)

## 2018-11-04 LAB — RETICULOCYTES
Immature Retic Fract: 12.4 % (ref 2.3–15.9)
RBC.: 4.5 MIL/uL (ref 3.87–5.11)
Retic Count, Absolute: 179.5 10*3/uL (ref 19.0–186.0)
Retic Ct Pct: 4 % — ABNORMAL HIGH (ref 0.4–3.1)

## 2018-11-04 MED ORDER — PREDNISONE 2.5 MG PO TABS: 2.5000 mg | ORAL_TABLET | Freq: Every day | ORAL | 1 refills | Status: DC

## 2018-11-04 NOTE — Progress Notes (Signed)
  [facility] OFFICE PROGRESS NOTE   Diagnosis: Autoimmune hemolytic anemia  INTERVAL HISTORY:   Carol [name] returns as scheduled.  Prednisone was tapered to 5 mg daily beginning 10/14/2018.  She reports a skin rash recurred last week.  She was instructed to continue prednisone 5 mg daily until she completed her current supply which she estimates to be about 2 more weeks.  The rash is pruritic.  She denies mouth sores.  No leg swelling.  She has a good appetite.  Objective:  Vital signs in last 24 hours:  Blood pressure (!) 165/73, pulse 68, temperature 97.6 F (36.4 C), temperature source Oral, resp. rate 17, height 5\' 4"  (1.626 m), weight 145 lb 6.4 oz (66 kg), SpO2 97 %.    HEENT: No thrush. Resp: Lungs clear bilaterally. Cardio: Irregular. GI: Abdomen soft and nontender.  No hepatosplenomegaly. Vascular: No leg edema. Skin: Papular erythematous rash scattered over the trunk.   Lab Results:  Lab Results  Component Value Date   WBC 9.1 11/04/2018   HGB 14.0 11/04/2018   HCT 41.2 11/04/2018   MCV 91.6 11/04/2018   PLT 174 11/04/2018   NEUTROABS 7.6 11/04/2018    Imaging:  No results found.  Medications: I have reviewed the patient's current medications.  Assessment/Plan: 1. Autoimmune hemolytic anemia, status post 2 units of packed red blood cells 05/14/2018, prednisone 05/15/2018, Solu-Medrol started 05/16/2018;prednisone 60 mg daily beginning 05/19/2018  Prednisone taper to 40 mg daily beginning 05/23/2018  Prednisone taper to 30 mg daily beginning 05/30/2018  Prednisone taper to 20 mg daily beginning 06/13/2018  Prednisone taper to 15 mg daily beginning 06/28/2018  Prednisone taper to 10 mg daily beginning 07/23/2018  Prednisone taper to 5 mg daily beginning 09/01/2018  Prednisone dose increased 09/16/2018 due to a rash, 20 mg for 2 weeks followed by 10 mg per dermatology  Prednisone decreased to 5 mg daily beginning 10/14/2018  2. History of  coronary artery disease  3. Status post aortic valve replacement, maintained on Coumadin  4. Hyperlipidemia  5. Hypothyroidism  6.Hospitalization 08/17/2018 through 08/20/2018 with E. coli UTI  Disposition: Carol [name] appears unchanged.  The hemoglobin remains in normal range.  The reticulocyte count continues to be mildly elevated.  She is currently on prednisone 5 mg daily.  She has instructions from dermatology to continue prednisone at a dose of 5 mg daily until she has completed the current supply.  She estimates this to be about 2 weeks.  At that point she will decrease prednisone to 2.5 mg daily.  She will return for lab and follow-up in approximately 5 weeks.  Plan reviewed with Dr. Benay Spice.    [name] ANP/GNP-BC   11/04/2018  1:07 PM

## 2018-11-04 NOTE — Telephone Encounter (Signed)
Printed avs and calender of upcoming appointment. Per 11/12 los

## 2018-11-05 DIAGNOSIS — Z7901 Long term (current) use of anticoagulants: Secondary | ICD-10-CM | POA: Diagnosis not present

## 2018-11-05 DIAGNOSIS — I4891 Unspecified atrial fibrillation: Secondary | ICD-10-CM | POA: Diagnosis not present

## 2018-11-06 DIAGNOSIS — M6281 Muscle weakness (generalized): Secondary | ICD-10-CM | POA: Diagnosis not present

## 2018-11-06 DIAGNOSIS — R2681 Unsteadiness on feet: Secondary | ICD-10-CM | POA: Diagnosis not present

## 2018-11-06 DIAGNOSIS — I4819 Other persistent atrial fibrillation: Secondary | ICD-10-CM | POA: Diagnosis not present

## 2018-11-06 DIAGNOSIS — R29898 Other symptoms and signs involving the musculoskeletal system: Secondary | ICD-10-CM | POA: Diagnosis not present

## 2018-11-11 DIAGNOSIS — R29898 Other symptoms and signs involving the musculoskeletal system: Secondary | ICD-10-CM | POA: Diagnosis not present

## 2018-11-11 DIAGNOSIS — I4819 Other persistent atrial fibrillation: Secondary | ICD-10-CM | POA: Diagnosis not present

## 2018-11-11 DIAGNOSIS — M6281 Muscle weakness (generalized): Secondary | ICD-10-CM | POA: Diagnosis not present

## 2018-11-11 DIAGNOSIS — R2681 Unsteadiness on feet: Secondary | ICD-10-CM | POA: Diagnosis not present

## 2018-11-12 ENCOUNTER — Encounter (INDEPENDENT_AMBULATORY_CARE_PROVIDER_SITE_OTHER): Payer: Medicare Other | Admitting: Ophthalmology

## 2018-11-13 ENCOUNTER — Encounter (INDEPENDENT_AMBULATORY_CARE_PROVIDER_SITE_OTHER): Payer: Medicare Other | Admitting: Ophthalmology

## 2018-11-13 DIAGNOSIS — I10 Essential (primary) hypertension: Secondary | ICD-10-CM | POA: Diagnosis not present

## 2018-11-13 DIAGNOSIS — H3411 Central retinal artery occlusion, right eye: Secondary | ICD-10-CM | POA: Diagnosis not present

## 2018-11-13 DIAGNOSIS — H43813 Vitreous degeneration, bilateral: Secondary | ICD-10-CM

## 2018-11-13 DIAGNOSIS — H35033 Hypertensive retinopathy, bilateral: Secondary | ICD-10-CM

## 2018-11-13 DIAGNOSIS — H34812 Central retinal vein occlusion, left eye, with macular edema: Secondary | ICD-10-CM | POA: Diagnosis not present

## 2018-11-14 ENCOUNTER — Other Ambulatory Visit: Payer: Self-pay | Admitting: Cardiology

## 2018-11-14 NOTE — Telephone Encounter (Signed)
° ° °  1. Which medications need to be refilled? (please list name of each medication and dose if known) Metoprolol tartrate 25mg  1BID  2. Which pharmacy/location (including street and city if local pharmacy) is medication to be sent to? [facility] [address] [facility]  3. Do they need a 30 day or 90 day supply? [place]

## 2018-11-17 DIAGNOSIS — Z7901 Long term (current) use of anticoagulants: Secondary | ICD-10-CM | POA: Diagnosis not present

## 2018-11-17 DIAGNOSIS — I4891 Unspecified atrial fibrillation: Secondary | ICD-10-CM | POA: Diagnosis not present

## 2018-11-17 MED ORDER — METOPROLOL TARTRATE 50 MG PO TABS: 25.0000 mg | ORAL_TABLET | Freq: Two times a day (BID) | ORAL | 0 refills | Status: DC

## 2018-11-17 NOTE — Telephone Encounter (Signed)
Metoprolol tartrate 25 mg tablet twice daily per Dr. Agustin Cree.

## 2018-11-18 DIAGNOSIS — M6281 Muscle weakness (generalized): Secondary | ICD-10-CM | POA: Diagnosis not present

## 2018-11-18 DIAGNOSIS — I4819 Other persistent atrial fibrillation: Secondary | ICD-10-CM | POA: Diagnosis not present

## 2018-11-18 DIAGNOSIS — R2681 Unsteadiness on feet: Secondary | ICD-10-CM | POA: Diagnosis not present

## 2018-11-18 DIAGNOSIS — R29898 Other symptoms and signs involving the musculoskeletal system: Secondary | ICD-10-CM | POA: Diagnosis not present

## 2018-11-27 DIAGNOSIS — I4891 Unspecified atrial fibrillation: Secondary | ICD-10-CM | POA: Diagnosis not present

## 2018-11-27 DIAGNOSIS — Z7901 Long term (current) use of anticoagulants: Secondary | ICD-10-CM | POA: Diagnosis not present

## 2018-12-09 ENCOUNTER — Inpatient Hospital Stay: Payer: Medicare Other | Attending: Nurse Practitioner

## 2018-12-09 ENCOUNTER — Telehealth: Payer: Self-pay | Admitting: Oncology

## 2018-12-09 ENCOUNTER — Inpatient Hospital Stay (HOSPITAL_BASED_OUTPATIENT_CLINIC_OR_DEPARTMENT_OTHER): Payer: Medicare Other | Admitting: Oncology

## 2018-12-09 VITALS — BP 155/77 | HR 76 | Temp 98.0°F | Resp 14 | Ht 64.0 in | Wt 145.7 lb

## 2018-12-09 DIAGNOSIS — Z7952 Long term (current) use of systemic steroids: Secondary | ICD-10-CM

## 2018-12-09 DIAGNOSIS — Z7901 Long term (current) use of anticoagulants: Secondary | ICD-10-CM

## 2018-12-09 DIAGNOSIS — E039 Hypothyroidism, unspecified: Secondary | ICD-10-CM | POA: Diagnosis not present

## 2018-12-09 DIAGNOSIS — D591 Autoimmune hemolytic anemia, unspecified: Secondary | ICD-10-CM

## 2018-12-09 DIAGNOSIS — E785 Hyperlipidemia, unspecified: Secondary | ICD-10-CM

## 2018-12-09 DIAGNOSIS — I2510 Atherosclerotic heart disease of native coronary artery without angina pectoris: Secondary | ICD-10-CM | POA: Insufficient documentation

## 2018-12-09 DIAGNOSIS — Z952 Presence of prosthetic heart valve: Secondary | ICD-10-CM | POA: Diagnosis not present

## 2018-12-09 DIAGNOSIS — Z79899 Other long term (current) drug therapy: Secondary | ICD-10-CM

## 2018-12-09 LAB — CBC WITH DIFFERENTIAL (CANCER CENTER ONLY)
Abs Immature Granulocytes: 0.04 10*3/uL (ref 0.00–0.07)
Basophils Absolute: 0 10*3/uL (ref 0.0–0.1)
Basophils Relative: 0 %
Eosinophils Absolute: 0.1 10*3/uL (ref 0.0–0.5)
Eosinophils Relative: 1 %
HCT: 38.6 % (ref 36.0–46.0)
Hemoglobin: 12.9 g/dL (ref 12.0–15.0)
Immature Granulocytes: 0 %
Lymphocytes Relative: 12 %
Lymphs Abs: 1.1 10*3/uL (ref 0.7–4.0)
MCH: 30.6 pg (ref 26.0–34.0)
MCHC: 33.4 g/dL (ref 30.0–36.0)
MCV: 91.7 fL (ref 80.0–100.0)
Monocytes Absolute: 1 10*3/uL (ref 0.1–1.0)
Monocytes Relative: 10 %
Neutro Abs: 7.3 10*3/uL (ref 1.7–7.7)
Neutrophils Relative %: 77 %
Platelet Count: 199 10*3/uL (ref 150–400)
RBC: 4.21 MIL/uL (ref 3.87–5.11)
RDW: 14.1 % (ref 11.5–15.5)
WBC Count: 9.5 10*3/uL (ref 4.0–10.5)
nRBC: 0 % (ref 0.0–0.2)

## 2018-12-09 LAB — RETICULOCYTES
Immature Retic Fract: 11.9 % (ref 2.3–15.9)
RBC.: 4.21 MIL/uL (ref 3.87–5.11)
Retic Count, Absolute: 166.3 10*3/uL (ref 19.0–186.0)
Retic Ct Pct: 4 % — ABNORMAL HIGH (ref 0.4–3.1)

## 2018-12-09 NOTE — Progress Notes (Signed)
  [place] [facility] OFFICE PROGRESS NOTE   Diagnosis: Autoimmune hemolytic anemia  INTERVAL HISTORY:   Carol [name] returns for a scheduled visit.  She continues prednisone at a dose of 2.5 mg daily.  The skin rash has resolved.  She scraped the left lower leg and has developed an ulcer.  Objective:  Vital signs in last 24 hours:  Blood pressure (!) 155/77, pulse 76, temperature 98 F (36.7 C), temperature source Oral, resp. rate 14, height 5\' 4"  (1.626 m), weight 145 lb 11.2 oz (66.1 kg), SpO2 96 %.    HEENT: No thrush Lymphatics: No cervical or supraclavicular nodes Resp: Lungs clear bilaterally Cardio: Irregular GI: No hepatosplenomegaly Vascular: Chronic stasis change at the lower leg bilaterally, trace pitting edema at the left lower leg  Skin: Superficial ulceration at the medial left lower leg   Lab Results:  Lab Results  Component Value Date   WBC 9.5 12/09/2018   HGB 12.9 12/09/2018   HCT 38.6 12/09/2018   MCV 91.7 12/09/2018   PLT 199 12/09/2018   NEUTROABS 7.3 12/09/2018     Medications: I have reviewed the patient's current medications.   Assessment/Plan: 1. Autoimmune hemolytic anemia, status post 2 units of packed red blood cells 05/14/2018, prednisone 05/15/2018, Solu-Medrol started 05/16/2018;prednisone 60 mg daily beginning 05/19/2018  Prednisone taper to 40 mg daily beginning 05/23/2018  Prednisone taper to 30 mg daily beginning 05/30/2018  Prednisone taper to 20 mg daily beginning 06/13/2018  Prednisone taper to 15 mg daily beginning 06/28/2018  Prednisone taper to 10 mg daily beginning 07/23/2018  Prednisone taper to 5 mg daily beginning 09/01/2018  Prednisone dose increased 09/16/2018 due to a rash, 20 mg for 2 weeks followed by 10 mg per dermatology  Prednisone decreased to 5 mg daily beginning 10/14/2018  Prednisone decreased to 2.5 mg daily approximately 11/18/2018  Prednisone decreased to 2.5 mg every other day 12/09/2018  2.  History of coronary artery disease  3. Status post aortic valve replacement, maintained on Coumadin  4. Hyperlipidemia  5. Hypothyroidism  6.Hospitalization 08/17/2018 through 08/20/2018 with E. coli UTI    Disposition: [name] appears unchanged.  The hemoglobin has decreased slightly over the past month, but remains in the low normal range.  We decrease the prednisone to 2.5 mg every other day.  She will return for an office and lab visit in 1 month.  We dressed the left leg ulcer with a Telfa gauze.  She will seek medical attention if this area does not heal.  Betsy Coder, MD  12/09/2018  11:24 AM

## 2018-12-09 NOTE — Telephone Encounter (Signed)
Printed calendar and avs. °

## 2018-12-10 ENCOUNTER — Other Ambulatory Visit: Payer: Self-pay | Admitting: *Deleted

## 2018-12-10 DIAGNOSIS — D591 Autoimmune hemolytic anemia, unspecified: Secondary | ICD-10-CM

## 2018-12-10 MED ORDER — PREDNISONE 2.5 MG PO TABS: 2.5000 mg | ORAL_TABLET | ORAL | 0 refills | Status: DC

## 2018-12-11 ENCOUNTER — Encounter (INDEPENDENT_AMBULATORY_CARE_PROVIDER_SITE_OTHER): Payer: Medicare Other | Admitting: Ophthalmology

## 2018-12-11 DIAGNOSIS — I4891 Unspecified atrial fibrillation: Secondary | ICD-10-CM | POA: Diagnosis not present

## 2018-12-11 DIAGNOSIS — H34812 Central retinal vein occlusion, left eye, with macular edema: Secondary | ICD-10-CM

## 2018-12-11 DIAGNOSIS — I10 Essential (primary) hypertension: Secondary | ICD-10-CM | POA: Diagnosis not present

## 2018-12-11 DIAGNOSIS — H3411 Central retinal artery occlusion, right eye: Secondary | ICD-10-CM

## 2018-12-11 DIAGNOSIS — H43813 Vitreous degeneration, bilateral: Secondary | ICD-10-CM | POA: Diagnosis not present

## 2018-12-11 DIAGNOSIS — H35033 Hypertensive retinopathy, bilateral: Secondary | ICD-10-CM

## 2018-12-12 ENCOUNTER — Telehealth: Payer: Self-pay | Admitting: *Deleted

## 2018-12-12 NOTE — Telephone Encounter (Signed)
Patient called to report she has started taking her prednisone 2.5 mg every other day and stopped the antihistamine as instructed and is now starting to itch again. Asking if she should take the prednisone daily again. Per Dr. Benay Spice: NO, continue qod prednisone and resume the antihistamine. Call if rash returns. Attempted to call patient X 2 and line was busy.

## 2018-12-15 ENCOUNTER — Telehealth: Payer: Self-pay

## 2018-12-15 ENCOUNTER — Telehealth: Payer: Self-pay | Admitting: *Deleted

## 2018-12-15 NOTE — Telephone Encounter (Addendum)
"  [name] [id number]) calling to notify Dr. Benay Spice Friday I broke out in what appeared to be hives.  Is Dr. Benay Spice okay with using prednisone daily.  Itching real bad Friday so I increased prednisone 2.5 mg daily and started using anti-histamine.  Prednisone was decreased to every other day with last F/U visit.  Itching has not gone away completely but much improved.  No whelps or broken skin.  Call me to let me know if he approves of what I'm doing."

## 2018-12-15 NOTE — Telephone Encounter (Signed)
Returning call to Pt. To inform her that per her message.Dr. Benay Spice  recommendation is to continue prednisone 2.5mg  daily and she can also continue her anti histamine as well. Dr. Benay Spice also said for Pt. To make an appointment with a Dermatologist. Pt. Verbalized understanding, No further problems or concerns noted.

## 2018-12-18 DIAGNOSIS — I4891 Unspecified atrial fibrillation: Secondary | ICD-10-CM | POA: Diagnosis not present

## 2019-01-01 DIAGNOSIS — I4891 Unspecified atrial fibrillation: Secondary | ICD-10-CM | POA: Diagnosis not present

## 2019-01-02 ENCOUNTER — Telehealth: Payer: Self-pay | Admitting: Cardiology

## 2019-01-02 MED ORDER — ATORVASTATIN CALCIUM 20 MG PO TABS: 20.0000 mg | ORAL_TABLET | Freq: Every day | ORAL | 0 refills | Status: DC

## 2019-01-02 NOTE — Telephone Encounter (Signed)
Per Dr. Geraldo Pitter patient needs lab work before refill. Informed patient of this she is unaware if she even needs a refill because her daughter handles her medications. She will check with her daughter and let us know if she still needs a refill before doing lab work. She was also advised that she will need a follow up appointment. She reports [place] is inconvienent for her, she would prefer [place]. Provided her with [facility] number she will call and make appointment there. I advised her to let us know about refill as we can still help with this if she needs until her next appointment. She verbally understands

## 2019-01-02 NOTE — Telephone Encounter (Signed)
°  1. Which medications need to be refilled? (please list name of each medication and dose if known) Atorvastatin 20mg  tablet  2. Which pharmacy/location (including street and city if local pharmacy) [id number]  3. Do they need a 30 day or 90 day supply? [place]

## 2019-01-02 NOTE — Telephone Encounter (Signed)
[facility]. Pt must make appointment to get further refills. [place] office to call patient to schedule appointment.

## 2019-01-05 ENCOUNTER — Telehealth: Payer: Self-pay | Admitting: Nurse Practitioner

## 2019-01-05 NOTE — Telephone Encounter (Signed)
R/s appt per 1/13 sch message - pt is aware of new appt date and time

## 2019-01-06 ENCOUNTER — Inpatient Hospital Stay: Payer: Medicare Other

## 2019-01-06 ENCOUNTER — Inpatient Hospital Stay: Payer: Medicare Other | Admitting: Nurse Practitioner

## 2019-01-06 DIAGNOSIS — M5442 Lumbago with sciatica, left side: Secondary | ICD-10-CM | POA: Diagnosis not present

## 2019-01-08 ENCOUNTER — Encounter (INDEPENDENT_AMBULATORY_CARE_PROVIDER_SITE_OTHER): Payer: Medicare Other | Admitting: Ophthalmology

## 2019-01-09 ENCOUNTER — Other Ambulatory Visit: Payer: Medicare Other

## 2019-01-09 ENCOUNTER — Ambulatory Visit: Payer: Medicare Other | Admitting: Nurse Practitioner

## 2019-01-14 ENCOUNTER — Inpatient Hospital Stay (HOSPITAL_BASED_OUTPATIENT_CLINIC_OR_DEPARTMENT_OTHER): Payer: Medicare Other | Admitting: Nurse Practitioner

## 2019-01-14 ENCOUNTER — Encounter: Payer: Self-pay | Admitting: Nurse Practitioner

## 2019-01-14 ENCOUNTER — Inpatient Hospital Stay: Payer: Medicare Other | Attending: Nurse Practitioner

## 2019-01-14 ENCOUNTER — Telehealth: Payer: Self-pay

## 2019-01-14 VITALS — BP 126/69 | HR 69 | Temp 97.5°F | Resp 16 | Ht 64.0 in | Wt 142.2 lb

## 2019-01-14 DIAGNOSIS — Z79899 Other long term (current) drug therapy: Secondary | ICD-10-CM

## 2019-01-14 DIAGNOSIS — Z7952 Long term (current) use of systemic steroids: Secondary | ICD-10-CM | POA: Diagnosis not present

## 2019-01-14 DIAGNOSIS — I2510 Atherosclerotic heart disease of native coronary artery without angina pectoris: Secondary | ICD-10-CM | POA: Insufficient documentation

## 2019-01-14 DIAGNOSIS — D591 Autoimmune hemolytic anemia, unspecified: Secondary | ICD-10-CM

## 2019-01-14 DIAGNOSIS — Z952 Presence of prosthetic heart valve: Secondary | ICD-10-CM | POA: Diagnosis not present

## 2019-01-14 DIAGNOSIS — L97929 Non-pressure chronic ulcer of unspecified part of left lower leg with unspecified severity: Secondary | ICD-10-CM | POA: Diagnosis not present

## 2019-01-14 DIAGNOSIS — E039 Hypothyroidism, unspecified: Secondary | ICD-10-CM | POA: Insufficient documentation

## 2019-01-14 DIAGNOSIS — E785 Hyperlipidemia, unspecified: Secondary | ICD-10-CM

## 2019-01-14 DIAGNOSIS — R21 Rash and other nonspecific skin eruption: Secondary | ICD-10-CM

## 2019-01-14 DIAGNOSIS — L97919 Non-pressure chronic ulcer of unspecified part of right lower leg with unspecified severity: Secondary | ICD-10-CM | POA: Diagnosis not present

## 2019-01-14 DIAGNOSIS — Z7901 Long term (current) use of anticoagulants: Secondary | ICD-10-CM | POA: Insufficient documentation

## 2019-01-14 LAB — CBC WITH DIFFERENTIAL (CANCER CENTER ONLY)
Abs Immature Granulocytes: 0.02 10*3/uL (ref 0.00–0.07)
Basophils Absolute: 0 10*3/uL (ref 0.0–0.1)
Basophils Relative: 0 %
Eosinophils Absolute: 0.1 10*3/uL (ref 0.0–0.5)
Eosinophils Relative: 1 %
HCT: 43.5 % (ref 36.0–46.0)
Hemoglobin: 14.9 g/dL (ref 12.0–15.0)
Immature Granulocytes: 0 %
Lymphocytes Relative: 13 %
Lymphs Abs: 1.3 10*3/uL (ref 0.7–4.0)
MCH: 30.8 pg (ref 26.0–34.0)
MCHC: 34.3 g/dL (ref 30.0–36.0)
MCV: 89.9 fL (ref 80.0–100.0)
Monocytes Absolute: 0.6 10*3/uL (ref 0.1–1.0)
Monocytes Relative: 6 %
Neutro Abs: 7.7 10*3/uL (ref 1.7–7.7)
Neutrophils Relative %: 80 %
Platelet Count: 181 10*3/uL (ref 150–400)
RBC: 4.84 MIL/uL (ref 3.87–5.11)
RDW: 13.7 % (ref 11.5–15.5)
WBC Count: 9.7 10*3/uL (ref 4.0–10.5)
nRBC: 0 % (ref 0.0–0.2)

## 2019-01-14 LAB — RETICULOCYTES
Immature Retic Fract: 9.7 % (ref 2.3–15.9)
RBC.: 4.84 MIL/uL (ref 3.87–5.11)
Retic Count, Absolute: 134.6 10*3/uL (ref 19.0–186.0)
Retic Ct Pct: 2.8 % (ref 0.4–3.1)

## 2019-01-14 NOTE — Telephone Encounter (Signed)
Printed avs and calender of upcoming appointment. Per 1/22

## 2019-01-14 NOTE — Progress Notes (Signed)
  [facility] OFFICE PROGRESS NOTE   Diagnosis: Autoimmune hemolytic anemia  INTERVAL HISTORY:   Carol [name] returns as scheduled.  Prednisone was decreased to 2.5 mg every other day beginning 12/09/2018.  Within 2 or 3 days the skin rash recurred.  She reports dermatology increased the prednisone back to 2.5 mg/day.  The rash has improved.  The skin ulcers at the lower legs are healing.  She denies fever.   Objective:  Vital signs in last 24 hours:  Blood pressure 126/69, pulse 69, temperature (!) 97.5 F (36.4 C), temperature source Oral, resp. rate 16, height 5\' 4"  (1.626 m), weight 142 lb 3.2 oz (64.5 kg), SpO2 98 %.    HEENT: No thrush. Resp: Lungs clear bilaterally. Cardio: Regular rate and rhythm. GI: Abdomen soft and nontender.  No hepatosplenomegaly. Vascular: No leg edema.  Chronic stasis change at the lower legs bilaterally. Skin: Superficial ulceration medial left lower leg.  Does not appear infected.   Lab Results:  Lab Results  Component Value Date   WBC 9.7 01/14/2019   HGB 14.9 01/14/2019   HCT 43.5 01/14/2019   MCV 89.9 01/14/2019   PLT 181 01/14/2019   NEUTROABS 7.7 01/14/2019    Imaging:  No results found.  Medications: I have reviewed the patient's current medications.  Assessment/Plan: 1. Autoimmune hemolytic anemia, status post 2 units of packed red blood cells 05/14/2018, prednisone 05/15/2018, Solu-Medrol started 05/16/2018;prednisone 60 mg daily beginning 05/19/2018  Prednisone taper to 40 mg daily beginning 05/23/2018  Prednisone taper to 30 mg daily beginning 05/30/2018  Prednisone taper to 20 mg daily beginning 06/13/2018  Prednisone taper to 15 mg daily beginning 06/28/2018  Prednisone taper to 10 mg daily beginning 07/23/2018  Prednisone taper to 5 mg daily beginning 09/01/2018  Prednisone dose increased 09/16/2018 due to a rash,20 mg for 2 weeks followed by 10 mg per dermatology  Prednisone decreased to 5 mg daily  beginning 10/14/2018  Prednisone decreased to 2.5 mg daily approximately 11/18/2018  Prednisone decreased to 2.5 mg every other day 12/09/2018  Prednisone increased to 2.5 mg daily 12/15/2018 due to recurrent rash  2. History of coronary artery disease  3. Status post aortic valve replacement, maintained on Coumadin  4. Hyperlipidemia  5. Hypothyroidism  6.Hospitalization 08/17/2018 through 08/20/2018 with E. coli UTI  Disposition: Carol [name] appears unchanged.  She developed a recurrent rash when the prednisone was tapered to 2.5 mg every other day.  She is currently taking 2.5 mg daily.  We reviewed the CBC from today.  The hemoglobin is higher in the normal range.  She will continue prednisone 2.5 mg daily.  She will return for labs in 2 months.  We will see her in follow-up with labs in 4 months.  She will contact the office in the interim with any problems.  Plan reviewed with Dr. Benay [name].    [name] ANP/GNP-BC   01/14/2019  2:34 PM

## 2019-01-15 DIAGNOSIS — I4891 Unspecified atrial fibrillation: Secondary | ICD-10-CM | POA: Diagnosis not present

## 2019-01-22 ENCOUNTER — Encounter (INDEPENDENT_AMBULATORY_CARE_PROVIDER_SITE_OTHER): Payer: Medicare Other | Admitting: Ophthalmology

## 2019-01-22 DIAGNOSIS — H43813 Vitreous degeneration, bilateral: Secondary | ICD-10-CM

## 2019-01-22 DIAGNOSIS — H35033 Hypertensive retinopathy, bilateral: Secondary | ICD-10-CM | POA: Diagnosis not present

## 2019-01-22 DIAGNOSIS — I10 Essential (primary) hypertension: Secondary | ICD-10-CM

## 2019-01-22 DIAGNOSIS — H3411 Central retinal artery occlusion, right eye: Secondary | ICD-10-CM | POA: Diagnosis not present

## 2019-01-22 DIAGNOSIS — H34812 Central retinal vein occlusion, left eye, with macular edema: Secondary | ICD-10-CM | POA: Diagnosis not present

## 2019-01-29 DIAGNOSIS — I4891 Unspecified atrial fibrillation: Secondary | ICD-10-CM | POA: Diagnosis not present

## 2019-01-30 ENCOUNTER — Ambulatory Visit (INDEPENDENT_AMBULATORY_CARE_PROVIDER_SITE_OTHER): Payer: Medicare Other | Admitting: Cardiology

## 2019-01-30 ENCOUNTER — Encounter: Payer: Self-pay | Admitting: Cardiology

## 2019-01-30 VITALS — BP 118/70 | HR 94 | Ht 64.0 in | Wt 142.0 lb

## 2019-01-30 DIAGNOSIS — Z7901 Long term (current) use of anticoagulants: Secondary | ICD-10-CM | POA: Diagnosis not present

## 2019-01-30 DIAGNOSIS — Z952 Presence of prosthetic heart valve: Secondary | ICD-10-CM | POA: Diagnosis not present

## 2019-01-30 DIAGNOSIS — I480 Paroxysmal atrial fibrillation: Secondary | ICD-10-CM

## 2019-01-30 DIAGNOSIS — E782 Mixed hyperlipidemia: Secondary | ICD-10-CM | POA: Diagnosis not present

## 2019-01-30 DIAGNOSIS — I2510 Atherosclerotic heart disease of native coronary artery without angina pectoris: Secondary | ICD-10-CM

## 2019-01-30 HISTORY — DX: Paroxysmal atrial fibrillation: I48.0

## 2019-01-30 MED ORDER — APIXABAN 2.5 MG PO TABS: 2.5000 mg | ORAL_TABLET | Freq: Two times a day (BID) | ORAL | 2 refills | Status: DC

## 2019-01-30 NOTE — Progress Notes (Signed)
Cardiology Office Note:    Date:  01/30/2019   ID:  Carol [name], DOB September 11, 1928, MRN [id number]  PCP:  Darcus Austin, MD (Inactive)  Cardiologist:  Jenean Lindau, MD   Referring MD: No ref. provider found    ASSESSMENT:    1. PAF (paroxysmal atrial fibrillation) ([place])   2. Coronary artery disease involving native coronary artery of native heart without angina pectoris   3. S/P AVR and CABG   4. Long term (current) use of anticoagulants   5. Mixed hyperlipidemia    PLAN:    In order of problems listed above:  1. Secondary prevention stressed with the patient.  Importance of compliance with diet and medication stressed and she vocalized understanding.  Her blood pressure is stable.  Lipids are followed by her primary care physician. 2. I discussed with the patient atrial fibrillation, disease process. Management and therapy including rate and rhythm control, anticoagulation benefits and potential risks were discussed extensively with the patient. Patient had multiple questions which were answered to patient's satisfaction. 3. I mentioned to her about newer anticoagulants.  She is keen on switching them.  Benefits and potential risks explained.  We will check her blood work today for INR and accordingly advise her about Eliquis and the dosing.  Her renal function is fine.  Again she had multiple questions about the newer anticoagulation agents and they were asked and answered to her satisfaction and she is happy about it. 4. Patient will be seen in follow-up appointment in 6 months or earlier if the patient has any concerns    Medication Adjustments/Labs and Tests Ordered: Current medicines are reviewed at length with the patient today.  Concerns regarding medicines are outlined above.  No orders of the defined types were placed in this encounter.  No orders of the defined types were placed in this encounter.    No chief complaint on file.    History of Present Illness:     Carol [name] is a 85 y.o. female.  Patient is followed by Dr. Wynonia Lawman and is now here to be established.  She is accompanied by her daughter who is very supportive.  Patient has known coronary artery disease post CABG surgery in the remote past and pericardial tissue aortic valve replacement.  She has history of essential hypertension and dyslipidemia and paroxysmal atrial fibrillation on anticoagulation with warfarin.  She denies any problems at this time and takes care of activities of daily living.  No chest pain orthopnea or PND.  She ambulates age appropriately.  She is a very cheerful lady.  At the time of my evaluation, the patient is alert awake oriented and in no distress.  Past Medical History:  Diagnosis Date  . Acute blood loss anemia 05/16/2018  . Aortic stenosis   . Coronary artery disease    Cath 1/13 left main  . 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 Dyspnea   . Hyperlipidemia   . Hypertension   . Hypertensive heart disease without CHF   . Hypothyroidism   . Lumbar disc disease   . Lung nodules    Sees Wert, thought to be benign  . Obesity   . Spinal stenosis   . Villous adenoma of rectum     Past Surgical History:  Procedure Laterality Date  . AORTIC VALVE REPLACEMENT  12/28/2011   Procedure: AORTIC VALVE REPLACEMENT (AVR);  Surgeon: Grace Isaac, MD;  Location: [facility];  Service: Open Heart Surgery;  Laterality: N/A;  . CARDIAC CATHETERIZATION  2013  . CHOLECYSTECTOMY    . CORONARY ARTERY BYPASS GRAFT  12/28/2011   Procedure: CORONARY ARTERY BYPASS GRAFTING (CABG);  Surgeon: Grace Isaac, MD;  Location: [facility];  Service: Open Heart Surgery;  Laterality: N/A;  . HERNIA REPAIR    . LEFT AND RIGHT HEART CATHETERIZATION WITH CORONARY ANGIOGRAM N/A 12/26/2011   Procedure: LEFT AND RIGHT HEART CATHETERIZATION WITH CORONARY ANGIOGRAM;  Surgeon: Jacolyn Reedy, MD;  Location: [facility] CATH LAB;  Service: Cardiovascular;  Laterality: N/A;  .  LUMBAR LAMINECTOMY    . PARTIAL COLECTOMY  2008   Villous adenoma    Current Medications: Current Meds  Medication Sig  . ALPRAZolam (XANAX) 0.25 MG tablet Take 0.25 mg by mouth at bedtime.  Marland Kitchen atorvastatin (LIPITOR) 20 MG tablet Take 1 tablet (20 mg total) by mouth at bedtime for 30 days.  Marland Kitchen BESIVANCE 0.6 % SUSP Place 1 drop into the left eye See admin instructions. Place 1 drop left eye 4 times daily for 2 days after Avastin eye injections  . Calcium Carbonate-Vitamin D (CALCIUM 600 + D PO) Take 1 tablet by mouth every morning.   . docusate sodium (COLACE) 100 MG capsule Take 100 mg by mouth at bedtime.   . fexofenadine (ALLEGRA) 180 MG tablet Take 180 mg by mouth daily.  Marland Kitchen gabapentin (NEURONTIN) 100 MG capsule Take 200 mg by mouth 3 (three) times daily.   Marland Kitchen levothyroxine (SYNTHROID, LEVOTHROID) 88 MCG tablet Take 88 mcg by mouth daily before breakfast.   . losartan (COZAAR) 100 MG tablet Take 100 mg by mouth daily.  . metoprolol tartrate (LOPRESSOR) 50 MG tablet Take 0.5 tablets (25 mg total) by mouth 2 (two) times daily.  . Multiple Vitamin (MULITIVITAMIN WITH MINERALS) TABS Take 1 tablet by mouth daily.    Marland Kitchen oxybutynin (DITROPAN) 5 MG tablet Take 5 mg by mouth every other day.  . polyethylene glycol (MIRALAX / GLYCOLAX) packet Take 17 g by mouth daily as needed for mild constipation.   . predniSONE (DELTASONE) 2.5 MG tablet Take 1 tablet (2.5 mg total) by mouth every other day. Begin after you have completed 5 mg daily.  . Probiotic Product (PROBIOTIC PO) Take 1 capsule by mouth daily.  Marland Kitchen warfarin (COUMADIN) 5 MG tablet Take 5 mg by mouth at bedtime.   Marland Kitchen warfarin (COUMADIN) 6 MG tablet Take 6 mg by mouth See admin instructions. Take 6 mg by mouth at bedtime on Sun/Mon/Wed/Fri/Sat and 2 mg on Tues and Thurs, in conjunction with one 5 mg tablet.     Allergies:   Ibuprofen; Nucynta [tapentadol]; Ace inhibitors; Doxycycline; Hydrocodone; Nsaids; Adhesive [tape]; and Latex   Social  History   Socioeconomic History  . Marital status: Widowed    Spouse name: Not on file  . Number of children: 4  . Years of education: 33  . Highest education level: 12th grade  Occupational History  . Occupation: retired Curator  . Financial resource strain: Not hard at all  . Food insecurity:    Worry: Never true    Inability: Never true  . Transportation needs:    Medical: No    Non-medical: No  Tobacco Use  . Smoking status: Former Smoker    Packs/day: 0.50    Years: 10.00    Pack years: 5.00    Types: Cigarettes    Last attempt to quit: 12/24/1978    Years since quitting: 40.1  . Smokeless tobacco:  Never Used  Substance and Sexual Activity  . Alcohol use: No  . Drug use: No  . Sexual activity: Not Currently    Birth control/protection: None  Lifestyle  . Physical activity:    Days per week: 2 days    Minutes per session: 30 min  . Stress: Only a little  Relationships  . Social connections:    Talks on phone: More than three times a week    Gets together: More than three times a week    Attends religious service: More than 4 times per year    Active member of club or organization: Yes    Attends meetings of clubs or organizations: More than 4 times per year    Relationship status: Widowed  Other Topics Concern  . Not on file  Social History Narrative   Lives at [place]   Phone [phone]   [phone]   Caffeine use: Drinks coffee- 2 cups per day at the most per pt     Family History: The patient's family history includes Breast cancer in her sister; Cancer in her mother and sister; Heart attack in her father; Heart disease in her father; Heart failure in her brother; Lymphoma in her sister.  ROS:   Please see the history of present illness.    All other systems reviewed and are negative.  EKGs/Labs/Other Studies Reviewed:    The following studies were reviewed today: EKG was sinus rhythm and nonspecific ST-T  changes.   Recent Labs: 08/18/2018: TSH 1.429 08/19/2018: ALT 54 08/20/2018: BUN 18; Creatinine, Ser 0.91; Magnesium 1.9; Potassium 4.2; Sodium 131 01/14/2019: Hemoglobin 14.9; Platelet Count 181  Recent Lipid Panel No results found for: CHOL, TRIG, HDL, CHOLHDL, VLDL, LDLCALC, LDLDIRECT  Physical Exam:    VS:  BP 118/70 (BP Location: Right Arm, Patient Position: Sitting, Cuff Size: Normal)   Pulse 94   Ht 5\' 4"  (1.626 m)   Wt 142 lb (64.4 kg)   SpO2 97%   BMI 24.37 kg/m     Wt Readings from Last 3 Encounters:  01/30/19 142 lb (64.4 kg)  01/14/19 142 lb 3.2 oz (64.5 kg)  12/09/18 145 lb 11.2 oz (66.1 kg)     GEN: Patient is in no acute distress HEENT: Normal NECK: No JVD; No carotid bruits LYMPHATICS: No lymphadenopathy CARDIAC: Hear sounds regular, 2/6 systolic murmur at the apex. RESPIRATORY:  Clear to auscultation without rales, wheezing or rhonchi  ABDOMEN: Soft, non-tender, non-distended MUSCULOSKELETAL:  No edema; No deformity  SKIN: Warm and dry NEUROLOGIC:  Alert and oriented x 3 PSYCHIATRIC:  Normal affect   Signed, Jenean Lindau, MD  01/30/2019 3:26 PM    [facility]

## 2019-01-30 NOTE — Addendum Note (Signed)
Addended by: Austin Miles on: 01/30/2019 03:44 PM   Modules accepted: Orders

## 2019-01-30 NOTE — Patient Instructions (Addendum)
Medication Instructions:  Your physician has recommended you make the following change in your medication:   STOP warfarin (coumadin) NOW   START apixaban (eliquis) 2.5 mg: Take 1 tablet twice daily: START TOMORROW EVENING!   If you need a refill on your cardiac medications before your next appointment, please call your pharmacy.   Lab work: Your physician recommends that you return for lab work today: INR check.   If you have labs (blood work) drawn today and your tests are completely normal, you will receive your results only by: Marland Kitchen MyChart Message (if you have MyChart) OR . A paper copy in the mail If you have any lab test that is abnormal or we need to change your treatment, we will call you to review the results.  Testing/Procedures: You had an EKG today.   Follow-Up: At [facility], you and your health needs are our priority.  As part of our continuing mission to provide you with exceptional heart care, we have created designated Provider Care Teams.  These Care Teams include your primary Cardiologist (physician) and Advanced Practice Providers (APPs -  Physician Assistants and Nurse Practitioners) who all work together to provide you with the care you need, when you need it. You will need a follow up appointment in 6 months.  Please call our office 2 months in advance to schedule this appointment.      Apixaban oral tablets What is this medicine? APIXABAN (a PIX a ban) is an anticoagulant (blood thinner). It is used to lower the chance of stroke in people with a medical condition called atrial fibrillation. It is also used to treat or prevent blood clots in the lungs or in the veins. This medicine may be used for other purposes; ask your health care provider or pharmacist if you have questions. COMMON BRAND NAME(S): Eliquis What should I tell my health care provider before I take this medicine? They need to know if you have any of these conditions: -bleeding  disorders -bleeding in the brain -blood in your stools (black or tarry stools) or if you have blood in your vomit -history of stomach bleeding -kidney disease -liver disease -mechanical heart valve -an unusual or allergic reaction to apixaban, other medicines, foods, dyes, or preservatives -pregnant or trying to get pregnant -breast-feeding How should I use this medicine? Take this medicine by mouth with a glass of water. Follow the directions on the prescription label. You can take it with or without food. If it upsets your stomach, take it with food. Take your medicine at regular intervals. Do not take it more often than directed. Do not stop taking except on your doctor's advice. Stopping this medicine may increase your risk of a blood clot. Be sure to refill your prescription before you run out of medicine. Talk to your pediatrician regarding the use of this medicine in children. Special care may be needed. Overdosage: If you think you have taken too much of this medicine contact a poison control center or emergency room at once. NOTE: This medicine is only for you. Do not share this medicine with others. What if I miss a dose? If you miss a dose, take it as soon as you can. If it is almost time for your next dose, take only that dose. Do not take double or extra doses. What may interact with this medicine? This medicine may interact with the following: -aspirin and aspirin-like medicines -certain medicines for fungal infections like ketoconazole and itraconazole -certain medicines for seizures  like carbamazepine and phenytoin -certain medicines that treat or prevent blood clots like warfarin, enoxaparin, and dalteparin -clarithromycin -NSAIDs, medicines for pain and inflammation, like ibuprofen or naproxen -rifampin -ritonavir -St. John's wort This list may not describe all possible interactions. Give your health care provider a list of all the medicines, herbs, non-prescription  drugs, or dietary supplements you use. Also tell them if you smoke, drink alcohol, or use illegal drugs. Some items may interact with your medicine. What should I watch for while using this medicine? Visit your healthcare professional for regular checks on your progress. You may need blood work done while you are taking this medicine. Your condition will be monitored carefully while you are receiving this medicine. It is important not to miss any appointments. Avoid sports and activities that might cause injury while you are using this medicine. Severe falls or injuries can cause unseen bleeding. Be careful when using sharp tools or knives. Consider using an Copy. Take special care brushing or flossing your teeth. Report any injuries, bruising, or red spots on the skin to your healthcare professional. If you are going to need surgery or other procedure, tell your healthcare professional that you are taking this medicine. Wear a medical ID bracelet or chain. Carry a card that describes your disease and details of your medicine and dosage times. What side effects may I notice from receiving this medicine? Side effects that you should report to your doctor or health care professional as soon as possible: -allergic reactions like skin rash, itching or hives, swelling of the face, lips, or tongue -signs and symptoms of bleeding such as bloody or black, tarry stools; red or dark-brown urine; spitting up blood or brown material that looks like coffee grounds; red spots on the skin; unusual bruising or bleeding from the eye, gums, or nose -signs and symptoms of a blood clot such as chest pain; shortness of breath; pain, swelling, or warmth in the leg -signs and symptoms of a stroke such as changes in vision; confusion; trouble speaking or understanding; severe headaches; sudden numbness or weakness of the face, arm or leg; trouble walking; dizziness; loss of coordination This list may not describe all  possible side effects. Call your doctor for medical advice about side effects. You may report side effects to FDA at 1-800-FDA-1088. Where should I keep my medicine? Keep out of the reach of children. Store at room temperature between 20 and 25 degrees C (68 and 77 degrees F). Throw away any unused medicine after the expiration date. NOTE: This sheet is a summary. It may not cover all possible information. If you have questions about this medicine, talk to your doctor, pharmacist, or health care provider.  2019 Elsevier/Gold Standard (2017-12-05 11:20:07)

## 2019-02-02 ENCOUNTER — Other Ambulatory Visit: Payer: Self-pay | Admitting: Cardiology

## 2019-02-10 ENCOUNTER — Other Ambulatory Visit: Payer: Self-pay

## 2019-02-10 ENCOUNTER — Telehealth: Payer: Self-pay | Admitting: Cardiology

## 2019-02-10 DIAGNOSIS — H903 Sensorineural hearing loss, bilateral: Secondary | ICD-10-CM | POA: Diagnosis not present

## 2019-02-10 MED ORDER — ATORVASTATIN CALCIUM 20 MG PO TABS: 20.0000 mg | ORAL_TABLET | Freq: Every day | ORAL | 3 refills | Status: DC

## 2019-02-10 NOTE — Telephone Encounter (Signed)
Refill sent task complete. 

## 2019-02-16 ENCOUNTER — Telehealth: Payer: Self-pay | Admitting: Medical Oncology

## 2019-02-16 NOTE — Telephone Encounter (Signed)
Refill prednisone -She will need them in a couple of days.

## 2019-02-17 ENCOUNTER — Telehealth: Payer: Self-pay | Admitting: *Deleted

## 2019-02-17 NOTE — Telephone Encounter (Signed)
Patient reports has a prescription in hand for Prednisone 2.5 mg one tablet every other day until she sees Dr Benay Spice.

## 2019-03-02 ENCOUNTER — Telehealth: Payer: Self-pay | Admitting: *Deleted

## 2019-03-02 NOTE — Telephone Encounter (Signed)
Pt's daughter called to let nurse know that Eliquis will be $90 next time and is there something else to do about cost. [name] stated that Barnetta Chapel wanted her to call and let her know about cost. [name]'s number is [phone]

## 2019-03-03 NOTE — Telephone Encounter (Signed)
Left message for patient's daughter, [name], to return call.

## 2019-03-03 NOTE — Telephone Encounter (Signed)
Daughter called back  

## 2019-03-03 NOTE — Telephone Encounter (Signed)
Carol [name] informed of eliquis patient assistance program and is agreeable. Paperwork will be mailed to patient's house per [name]'s request. Carol [name] and the patient will fill it out and bring completed paperwork along with proof of income to the [facility] office for processing. No further questions.

## 2019-03-05 ENCOUNTER — Encounter (INDEPENDENT_AMBULATORY_CARE_PROVIDER_SITE_OTHER): Payer: Medicare Other | Admitting: Ophthalmology

## 2019-03-09 ENCOUNTER — Encounter (INDEPENDENT_AMBULATORY_CARE_PROVIDER_SITE_OTHER): Payer: Medicare Other | Admitting: Ophthalmology

## 2019-03-11 ENCOUNTER — Other Ambulatory Visit: Payer: Medicare Other

## 2019-03-16 ENCOUNTER — Other Ambulatory Visit: Payer: Self-pay | Admitting: *Deleted

## 2019-03-16 ENCOUNTER — Inpatient Hospital Stay: Payer: Medicare Other | Attending: Nurse Practitioner

## 2019-03-16 ENCOUNTER — Other Ambulatory Visit: Payer: Self-pay

## 2019-03-16 DIAGNOSIS — D591 Autoimmune hemolytic anemia, unspecified: Secondary | ICD-10-CM

## 2019-03-16 DIAGNOSIS — R591 Generalized enlarged lymph nodes: Secondary | ICD-10-CM | POA: Insufficient documentation

## 2019-03-16 LAB — CBC WITH DIFFERENTIAL (CANCER CENTER ONLY)
Abs Immature Granulocytes: 0.03 10*3/uL (ref 0.00–0.07)
Basophils Absolute: 0.1 10*3/uL (ref 0.0–0.1)
Basophils Relative: 1 %
Eosinophils Absolute: 0.1 10*3/uL (ref 0.0–0.5)
Eosinophils Relative: 1 %
HCT: 41.2 % (ref 36.0–46.0)
Hemoglobin: 13.7 g/dL (ref 12.0–15.0)
Immature Granulocytes: 0 %
Lymphocytes Relative: 11 %
Lymphs Abs: 1.1 10*3/uL (ref 0.7–4.0)
MCH: 30.2 pg (ref 26.0–34.0)
MCHC: 33.3 g/dL (ref 30.0–36.0)
MCV: 90.7 fL (ref 80.0–100.0)
Monocytes Absolute: 0.7 10*3/uL (ref 0.1–1.0)
Monocytes Relative: 7 %
Neutro Abs: 7.9 10*3/uL — ABNORMAL HIGH (ref 1.7–7.7)
Neutrophils Relative %: 80 %
Platelet Count: 188 10*3/uL (ref 150–400)
RBC: 4.54 MIL/uL (ref 3.87–5.11)
RDW: 14 % (ref 11.5–15.5)
WBC Count: 9.9 10*3/uL (ref 4.0–10.5)
nRBC: 0 % (ref 0.0–0.2)

## 2019-03-16 LAB — RETICULOCYTES
Immature Retic Fract: 7.3 % (ref 2.3–15.9)
RBC.: 4.49 MIL/uL (ref 3.87–5.11)
Retic Count, Absolute: 166.6 10*3/uL (ref 19.0–186.0)
Retic Ct Pct: 3.7 % — ABNORMAL HIGH (ref 0.4–3.1)

## 2019-03-16 MED ORDER — PREDNISONE 1 MG PO TABS: 2.0000 mg | ORAL_TABLET | ORAL | 0 refills | Status: DC

## 2019-03-18 ENCOUNTER — Other Ambulatory Visit: Payer: Self-pay

## 2019-03-18 ENCOUNTER — Telehealth: Payer: Self-pay | Admitting: Cardiology

## 2019-03-18 MED ORDER — METOPROLOL TARTRATE 25 MG PO TABS: 25.0000 mg | ORAL_TABLET | Freq: Two times a day (BID) | ORAL | 6 refills | Status: DC

## 2019-03-18 NOTE — Telephone Encounter (Signed)
Patient requesting 25 mg metoprolol tablet be sent for refill.

## 2019-03-18 NOTE — Telephone Encounter (Signed)
Patient's daughter called stating her mother metoprolol was 25mg  twice a day to now 50mg  and break in half. Patient is having a very difficult time with the small pills, so can she take 50mg  once a day or go back to 25mg  twice a day? Please advise.

## 2019-03-26 ENCOUNTER — Other Ambulatory Visit: Payer: Self-pay | Admitting: Cardiology

## 2019-03-30 ENCOUNTER — Other Ambulatory Visit: Payer: Self-pay | Admitting: *Deleted

## 2019-03-30 MED ORDER — PREDNISONE 2.5 MG PO TABS: 2.5000 mg | ORAL_TABLET | ORAL | 0 refills | Status: DC

## 2019-04-11 ENCOUNTER — Other Ambulatory Visit: Payer: Self-pay | Admitting: Nurse Practitioner

## 2019-04-11 DIAGNOSIS — D591 Autoimmune hemolytic anemia, unspecified: Secondary | ICD-10-CM

## 2019-04-16 ENCOUNTER — Other Ambulatory Visit: Payer: Self-pay

## 2019-04-16 ENCOUNTER — Encounter (INDEPENDENT_AMBULATORY_CARE_PROVIDER_SITE_OTHER): Payer: Medicare Other | Admitting: Ophthalmology

## 2019-04-16 DIAGNOSIS — H34812 Central retinal vein occlusion, left eye, with macular edema: Secondary | ICD-10-CM

## 2019-04-16 DIAGNOSIS — I10 Essential (primary) hypertension: Secondary | ICD-10-CM

## 2019-04-16 DIAGNOSIS — H43813 Vitreous degeneration, bilateral: Secondary | ICD-10-CM | POA: Diagnosis not present

## 2019-04-16 DIAGNOSIS — H3411 Central retinal artery occlusion, right eye: Secondary | ICD-10-CM | POA: Diagnosis not present

## 2019-04-16 DIAGNOSIS — H35033 Hypertensive retinopathy, bilateral: Secondary | ICD-10-CM

## 2019-04-19 ENCOUNTER — Other Ambulatory Visit: Payer: Self-pay | Admitting: Nurse Practitioner

## 2019-04-19 DIAGNOSIS — D591 Autoimmune hemolytic anemia, unspecified: Secondary | ICD-10-CM

## 2019-04-20 NOTE — Telephone Encounter (Signed)
Take 1mg  every other day x 2 weeks then STOP

## 2019-04-29 ENCOUNTER — Other Ambulatory Visit: Payer: Self-pay | Admitting: Cardiology

## 2019-05-04 ENCOUNTER — Telehealth: Payer: Self-pay | Admitting: Oncology

## 2019-05-04 ENCOUNTER — Inpatient Hospital Stay: Payer: Medicare Other | Attending: Nurse Practitioner

## 2019-05-04 ENCOUNTER — Other Ambulatory Visit: Payer: Self-pay | Admitting: *Deleted

## 2019-05-04 ENCOUNTER — Other Ambulatory Visit: Payer: Self-pay

## 2019-05-04 ENCOUNTER — Inpatient Hospital Stay (HOSPITAL_BASED_OUTPATIENT_CLINIC_OR_DEPARTMENT_OTHER): Payer: Medicare Other | Admitting: Oncology

## 2019-05-04 DIAGNOSIS — D591 Autoimmune hemolytic anemia, unspecified: Secondary | ICD-10-CM

## 2019-05-04 DIAGNOSIS — Z952 Presence of prosthetic heart valve: Secondary | ICD-10-CM | POA: Insufficient documentation

## 2019-05-04 DIAGNOSIS — R5383 Other fatigue: Secondary | ICD-10-CM | POA: Diagnosis not present

## 2019-05-04 DIAGNOSIS — R630 Anorexia: Secondary | ICD-10-CM | POA: Insufficient documentation

## 2019-05-04 DIAGNOSIS — Z7952 Long term (current) use of systemic steroids: Secondary | ICD-10-CM | POA: Diagnosis not present

## 2019-05-04 DIAGNOSIS — I2510 Atherosclerotic heart disease of native coronary artery without angina pectoris: Secondary | ICD-10-CM | POA: Diagnosis not present

## 2019-05-04 DIAGNOSIS — E785 Hyperlipidemia, unspecified: Secondary | ICD-10-CM | POA: Insufficient documentation

## 2019-05-04 DIAGNOSIS — Z79899 Other long term (current) drug therapy: Secondary | ICD-10-CM | POA: Insufficient documentation

## 2019-05-04 DIAGNOSIS — Z7901 Long term (current) use of anticoagulants: Secondary | ICD-10-CM | POA: Insufficient documentation

## 2019-05-04 DIAGNOSIS — E039 Hypothyroidism, unspecified: Secondary | ICD-10-CM | POA: Insufficient documentation

## 2019-05-04 LAB — CMP (CANCER CENTER ONLY)
ALT: 18 U/L (ref 0–44)
AST: 18 U/L (ref 15–41)
Albumin: 4.1 g/dL (ref 3.5–5.0)
Alkaline Phosphatase: 62 U/L (ref 38–126)
Anion gap: 9 (ref 5–15)
BUN: 18 mg/dL (ref 8–23)
CO2: 28 mmol/L (ref 22–32)
Calcium: 9.4 mg/dL (ref 8.9–10.3)
Chloride: 96 mmol/L — ABNORMAL LOW (ref 98–111)
Creatinine: 1.01 mg/dL — ABNORMAL HIGH (ref 0.44–1.00)
GFR, Est AFR Am: 57 mL/min — ABNORMAL LOW (ref >60)
GFR, Estimated: 49 mL/min — ABNORMAL LOW (ref >60)
Glucose, Bld: 110 mg/dL — ABNORMAL HIGH (ref 70–99)
Potassium: 4.2 mmol/L (ref 3.5–5.1)
Sodium: 133 mmol/L — ABNORMAL LOW (ref 135–145)
Total Bilirubin: 0.9 mg/dL (ref 0.3–1.2)
Total Protein: 6.6 g/dL (ref 6.5–8.1)

## 2019-05-04 LAB — CBC WITH DIFFERENTIAL (CANCER CENTER ONLY)
Abs Immature Granulocytes: 0.03 10*3/uL (ref 0.00–0.07)
Basophils Absolute: 0.1 10*3/uL (ref 0.0–0.1)
Basophils Relative: 1 %
Eosinophils Absolute: 0.2 10*3/uL (ref 0.0–0.5)
Eosinophils Relative: 2 %
HCT: 39.6 % (ref 36.0–46.0)
Hemoglobin: 13 g/dL (ref 12.0–15.0)
Immature Granulocytes: 0 %
Lymphocytes Relative: 18 %
Lymphs Abs: 1.6 10*3/uL (ref 0.7–4.0)
MCH: 30.3 pg (ref 26.0–34.0)
MCHC: 32.8 g/dL (ref 30.0–36.0)
MCV: 92.3 fL (ref 80.0–100.0)
Monocytes Absolute: 0.8 10*3/uL (ref 0.1–1.0)
Monocytes Relative: 9 %
Neutro Abs: 6.3 10*3/uL (ref 1.7–7.7)
Neutrophils Relative %: 70 %
Platelet Count: 194 10*3/uL (ref 150–400)
RBC: 4.29 MIL/uL (ref 3.87–5.11)
RDW: 14.3 % (ref 11.5–15.5)
WBC Count: 9 10*3/uL (ref 4.0–10.5)
nRBC: 0 % (ref 0.0–0.2)

## 2019-05-04 LAB — RETICULOCYTES
Immature Retic Fract: 11.7 % (ref 2.3–15.9)
RBC.: 4.28 MIL/uL (ref 3.87–5.11)
Retic Count, Absolute: 164.4 10*3/uL (ref 19.0–186.0)
Retic Ct Pct: 3.8 % — ABNORMAL HIGH (ref 0.4–3.1)

## 2019-05-04 MED ORDER — PREDNISONE 1 MG PO TABS: 1.0000 mg | ORAL_TABLET | ORAL | 0 refills | Status: DC

## 2019-05-04 NOTE — Progress Notes (Signed)
  [facility] OFFICE VISIT PROGRESS NOTE  I connected with@ on 05/04/19 at 10:30 AM EDT by telephone and verified that I am speaking with the correct person using two identifiers.    Patient's location: Home Provider's location: Office   Diagnosis: Autoimmune hemolytic anemia  INTERVAL HISTORY:   Ms. Horsfall is seen today by a phone visit.  This is due to the [place] pandemic.  She agreed to a phone visit in lieu of an in person visit. She is maintained on prednisone at a dose of 1 mg every other day.  No rash.  She reports taking prednisone at this dose for greater than 1 month.  She has diminished energy.  Her appetite is poor, but she has a stable weight.  No fever or recent infection.    Lab Results:  Lab Results  Component Value Date   WBC 9.9 03/16/2019   HGB 13.7 03/16/2019   HCT 41.2 03/16/2019   MCV 90.7 03/16/2019   PLT 188 03/16/2019   NEUTROABS 7.9 (H) 03/16/2019    Imaging:  No results found.  Medications: I have reviewed the patient's current medications.  Assessment/Plan: 1.Autoimmune hemolytic anemia, status post 2 units of packed red blood cells 05/14/2018, prednisone 05/15/2018, Solu-Medrol started 05/16/2018;prednisone 60 mg daily beginning 05/19/2018  Prednisone taper to 40 mg daily beginning 05/23/2018  Prednisone taper to 30 mg daily beginning 05/30/2018  Prednisone taper to 20 mg daily beginning 06/13/2018  Prednisone taper to 15 mg daily beginning 06/28/2018  Prednisone taper to 10 mg daily beginning 07/23/2018  Prednisone taper to 5 mg daily beginning 09/01/2018  Prednisone dose increased 09/16/2018 due to a rash,20 mg for 2 weeks followed by 10 mg per dermatology  Prednisone decreased to 5 mg daily beginning 10/14/2018  Prednisone decreased to 2.5 mg daily approximately 11/18/2018  Prednisone decreased to 2.5 mg every other day 12/09/2018  Prednisone increased to 2.5 mg daily 12/15/2018 due to  recurrent rash  Prednisone changed to 1 mg every other day  2. History of coronary artery disease  3. Status post aortic valve replacement, maintained on Coumadin  4. Hyperlipidemia  5. Hypothyroidism  6.Hospitalization 08/17/2018 through 08/20/2018 with E. coli UTI   Disposition:  Ms. Shippee sounds unchanged.  She currently takes prednisone at a dose of 1 mg every other day.  She will return later today for a lab visit.  The plan is to discontinue prednisone if the hemoglobin remains normal.  She will return in 6 weeks for a CBC and in 12 weeks for an office visit. We will update her daughter by telephone today.  I provided 22 minutes of phone and documentation time during this encounter, and > 50% was spent counseling as documented under my assessment & plan.  Betsy Coder ANP/GNP-BC   05/04/2019 10:51 AM

## 2019-05-04 NOTE — Telephone Encounter (Signed)
Scheduled appt per 5/11 los.  A calendar will be mailed out.

## 2019-05-04 NOTE — Progress Notes (Signed)
Called daughter with update on telephone visit with Dr. Benay Spice today and treatment plans and follow up. She appreciates the call.

## 2019-05-05 ENCOUNTER — Telehealth: Payer: Self-pay

## 2019-05-05 NOTE — Telephone Encounter (Signed)
Left voicemail for daughter to call back [place].

## 2019-05-10 ENCOUNTER — Other Ambulatory Visit: Payer: Self-pay | Admitting: Cardiology

## 2019-05-11 ENCOUNTER — Telehealth: Payer: Self-pay

## 2019-05-11 NOTE — Telephone Encounter (Signed)
Returned pt call in regards to sodium level number on 05/04/19. Pt aware that sodium was 133 on 5/11 and verbalized understanding. Advised pt that sodium is included in a CMP.

## 2019-05-12 ENCOUNTER — Other Ambulatory Visit: Payer: Self-pay

## 2019-05-12 MED ORDER — METOPROLOL TARTRATE 25 MG PO TABS: 25.0000 mg | ORAL_TABLET | Freq: Two times a day (BID) | ORAL | 6 refills | Status: DC

## 2019-05-13 DIAGNOSIS — I951 Orthostatic hypotension: Secondary | ICD-10-CM | POA: Diagnosis not present

## 2019-05-13 DIAGNOSIS — I10 Essential (primary) hypertension: Secondary | ICD-10-CM | POA: Diagnosis not present

## 2019-05-13 DIAGNOSIS — E039 Hypothyroidism, unspecified: Secondary | ICD-10-CM | POA: Diagnosis not present

## 2019-05-13 DIAGNOSIS — I480 Paroxysmal atrial fibrillation: Secondary | ICD-10-CM | POA: Diagnosis not present

## 2019-05-13 DIAGNOSIS — E782 Mixed hyperlipidemia: Secondary | ICD-10-CM | POA: Diagnosis not present

## 2019-05-14 ENCOUNTER — Other Ambulatory Visit: Payer: Medicare Other

## 2019-05-14 ENCOUNTER — Ambulatory Visit: Payer: Medicare Other | Admitting: Oncology

## 2019-05-28 ENCOUNTER — Other Ambulatory Visit: Payer: Self-pay

## 2019-05-28 ENCOUNTER — Encounter (INDEPENDENT_AMBULATORY_CARE_PROVIDER_SITE_OTHER): Payer: Medicare Other | Admitting: Ophthalmology

## 2019-05-28 DIAGNOSIS — H34812 Central retinal vein occlusion, left eye, with macular edema: Secondary | ICD-10-CM | POA: Diagnosis not present

## 2019-05-28 DIAGNOSIS — H43813 Vitreous degeneration, bilateral: Secondary | ICD-10-CM | POA: Diagnosis not present

## 2019-05-28 DIAGNOSIS — I10 Essential (primary) hypertension: Secondary | ICD-10-CM

## 2019-05-28 DIAGNOSIS — H3411 Central retinal artery occlusion, right eye: Secondary | ICD-10-CM | POA: Diagnosis not present

## 2019-05-28 DIAGNOSIS — H35033 Hypertensive retinopathy, bilateral: Secondary | ICD-10-CM | POA: Diagnosis not present

## 2019-05-30 ENCOUNTER — Other Ambulatory Visit: Payer: Self-pay | Admitting: Cardiology

## 2019-06-15 ENCOUNTER — Inpatient Hospital Stay: Payer: Medicare Other | Attending: Nurse Practitioner

## 2019-06-15 ENCOUNTER — Other Ambulatory Visit: Payer: Self-pay

## 2019-06-15 DIAGNOSIS — D591 Autoimmune hemolytic anemia, unspecified: Secondary | ICD-10-CM

## 2019-06-15 LAB — CBC WITH DIFFERENTIAL (CANCER CENTER ONLY)
Abs Immature Granulocytes: 0.05 10*3/uL (ref 0.00–0.07)
Basophils Absolute: 0.1 10*3/uL (ref 0.0–0.1)
Basophils Relative: 1 %
Eosinophils Absolute: 0.2 10*3/uL (ref 0.0–0.5)
Eosinophils Relative: 2 %
HCT: 34.4 % — ABNORMAL LOW (ref 36.0–46.0)
Hemoglobin: 11.5 g/dL — ABNORMAL LOW (ref 12.0–15.0)
Immature Granulocytes: 1 %
Lymphocytes Relative: 18 %
Lymphs Abs: 1.7 10*3/uL (ref 0.7–4.0)
MCH: 30.3 pg (ref 26.0–34.0)
MCHC: 33.4 g/dL (ref 30.0–36.0)
MCV: 90.8 fL (ref 80.0–100.0)
Monocytes Absolute: 1.1 10*3/uL — ABNORMAL HIGH (ref 0.1–1.0)
Monocytes Relative: 12 %
Neutro Abs: 6.5 10*3/uL (ref 1.7–7.7)
Neutrophils Relative %: 66 %
Platelet Count: 195 10*3/uL (ref 150–400)
RBC: 3.79 MIL/uL — ABNORMAL LOW (ref 3.87–5.11)
RDW: 15.2 % (ref 11.5–15.5)
WBC Count: 9.6 10*3/uL (ref 4.0–10.5)
nRBC: 0 % (ref 0.0–0.2)

## 2019-06-17 ENCOUNTER — Telehealth: Payer: Self-pay | Admitting: *Deleted

## 2019-06-17 ENCOUNTER — Telehealth: Payer: Self-pay | Admitting: Oncology

## 2019-06-17 DIAGNOSIS — D591 Autoimmune hemolytic anemia, unspecified: Secondary | ICD-10-CM

## 2019-06-17 NOTE — Telephone Encounter (Signed)
Left VM for guardian, Carol [name] that Hgb is lower, but no a dangerous low. Call for any s/s of anemia such as dizziness, fast pulse, short of breath. Will recheck labs in 4 weeks. Scheduling message sent.

## 2019-06-17 NOTE — Telephone Encounter (Signed)
Scheduled appt per 6/24 sch message - pt daughter and pt aware

## 2019-06-17 NOTE — Telephone Encounter (Signed)
-----   Message from Ladell Pier, MD sent at 06/15/2019  1:46 PM EDT ----- Please call patient, hemoglobin is slightly lower, call for symptoms of anemia, repeat CBC in 4 weeks with reticulocyte count

## 2019-06-29 DIAGNOSIS — D591 Other autoimmune hemolytic anemias: Secondary | ICD-10-CM | POA: Diagnosis not present

## 2019-06-29 DIAGNOSIS — E039 Hypothyroidism, unspecified: Secondary | ICD-10-CM | POA: Diagnosis not present

## 2019-06-29 DIAGNOSIS — I480 Paroxysmal atrial fibrillation: Secondary | ICD-10-CM | POA: Diagnosis not present

## 2019-06-29 DIAGNOSIS — M961 Postlaminectomy syndrome, not elsewhere classified: Secondary | ICD-10-CM | POA: Diagnosis not present

## 2019-06-29 DIAGNOSIS — I10 Essential (primary) hypertension: Secondary | ICD-10-CM | POA: Diagnosis not present

## 2019-06-29 DIAGNOSIS — E782 Mixed hyperlipidemia: Secondary | ICD-10-CM | POA: Diagnosis not present

## 2019-06-29 DIAGNOSIS — F419 Anxiety disorder, unspecified: Secondary | ICD-10-CM | POA: Diagnosis not present

## 2019-06-29 DIAGNOSIS — Z0001 Encounter for general adult medical examination with abnormal findings: Secondary | ICD-10-CM | POA: Diagnosis not present

## 2019-06-29 DIAGNOSIS — L989 Disorder of the skin and subcutaneous tissue, unspecified: Secondary | ICD-10-CM | POA: Diagnosis not present

## 2019-07-03 ENCOUNTER — Other Ambulatory Visit: Payer: Self-pay | Admitting: Oncology

## 2019-07-03 DIAGNOSIS — D591 Autoimmune hemolytic anemia, unspecified: Secondary | ICD-10-CM

## 2019-07-10 ENCOUNTER — Encounter (INDEPENDENT_AMBULATORY_CARE_PROVIDER_SITE_OTHER): Payer: Medicare Other | Admitting: Ophthalmology

## 2019-07-13 ENCOUNTER — Inpatient Hospital Stay: Payer: Medicare Other | Attending: Nurse Practitioner

## 2019-07-13 ENCOUNTER — Telehealth: Payer: Self-pay | Admitting: *Deleted

## 2019-07-13 ENCOUNTER — Other Ambulatory Visit: Payer: Self-pay

## 2019-07-13 DIAGNOSIS — E785 Hyperlipidemia, unspecified: Secondary | ICD-10-CM | POA: Insufficient documentation

## 2019-07-13 DIAGNOSIS — Z7901 Long term (current) use of anticoagulants: Secondary | ICD-10-CM | POA: Insufficient documentation

## 2019-07-13 DIAGNOSIS — Z952 Presence of prosthetic heart valve: Secondary | ICD-10-CM | POA: Insufficient documentation

## 2019-07-13 DIAGNOSIS — R5383 Other fatigue: Secondary | ICD-10-CM | POA: Diagnosis not present

## 2019-07-13 DIAGNOSIS — R0609 Other forms of dyspnea: Secondary | ICD-10-CM | POA: Insufficient documentation

## 2019-07-13 DIAGNOSIS — E039 Hypothyroidism, unspecified: Secondary | ICD-10-CM | POA: Diagnosis not present

## 2019-07-13 DIAGNOSIS — R630 Anorexia: Secondary | ICD-10-CM | POA: Diagnosis not present

## 2019-07-13 DIAGNOSIS — D591 Autoimmune hemolytic anemia, unspecified: Secondary | ICD-10-CM

## 2019-07-13 DIAGNOSIS — I2510 Atherosclerotic heart disease of native coronary artery without angina pectoris: Secondary | ICD-10-CM | POA: Insufficient documentation

## 2019-07-13 DIAGNOSIS — Z79899 Other long term (current) drug therapy: Secondary | ICD-10-CM | POA: Diagnosis not present

## 2019-07-13 LAB — RETICULOCYTES
Immature Retic Fract: 13.3 % (ref 2.3–15.9)
RBC.: 3.45 MIL/uL — ABNORMAL LOW (ref 3.87–5.11)
Retic Count, Absolute: 220 10*3/uL — ABNORMAL HIGH (ref 19.0–186.0)
Retic Ct Pct: 6.3 % — ABNORMAL HIGH (ref 0.4–3.1)

## 2019-07-13 LAB — CBC WITH DIFFERENTIAL (CANCER CENTER ONLY)
Abs Immature Granulocytes: 0.02 10*3/uL (ref 0.00–0.07)
Basophils Absolute: 0 10*3/uL (ref 0.0–0.1)
Basophils Relative: 1 %
Eosinophils Absolute: 0.1 10*3/uL (ref 0.0–0.5)
Eosinophils Relative: 1 %
HCT: 31.7 % — ABNORMAL LOW (ref 36.0–46.0)
Hemoglobin: 10.7 g/dL — ABNORMAL LOW (ref 12.0–15.0)
Immature Granulocytes: 0 %
Lymphocytes Relative: 17 %
Lymphs Abs: 1.3 10*3/uL (ref 0.7–4.0)
MCH: 30.2 pg (ref 26.0–34.0)
MCHC: 33.8 g/dL (ref 30.0–36.0)
MCV: 89.5 fL (ref 80.0–100.0)
Monocytes Absolute: 0.8 10*3/uL (ref 0.1–1.0)
Monocytes Relative: 10 %
Neutro Abs: 5.6 10*3/uL (ref 1.7–7.7)
Neutrophils Relative %: 71 %
Platelet Count: 183 10*3/uL (ref 150–400)
RBC: 3.54 MIL/uL — ABNORMAL LOW (ref 3.87–5.11)
RDW: 15.7 % — ABNORMAL HIGH (ref 11.5–15.5)
WBC Count: 7.9 10*3/uL (ref 4.0–10.5)
nRBC: 0 % (ref 0.0–0.2)

## 2019-07-13 MED ORDER — PREDNISONE 20 MG PO TABS: 40.0000 mg | ORAL_TABLET | Freq: Every day | ORAL | 1 refills | Status: DC

## 2019-07-13 NOTE — Telephone Encounter (Addendum)
Notified patient of decrease in Hgb and MD thinks she is hemolyzing again. Instructed to resume Prednisone 40 mg daily--she has nothing but the 1 mg tabs--will send in refill for 20 mg tablets. She agrees to lab/OV on 07/17/19 at 1:45/2:15 pm per MD order.  Patient requests her daughter, April be called with above information. Called and left her a detailed voice mail w/results, prednisone dose and appointment on Friday.

## 2019-07-13 NOTE — Telephone Encounter (Signed)
-----   Message from Carol Pier, MD sent at 07/13/2019  1:30 PM EDT ----- Please call patient, Carol [name] is lower, looks like she is hemolyzing again, increase prednisone to 40mg  daily, office and cbc,cmet, ldh, hep b core ab, heb b antigen Carol [name] 7/23 2:45 or 7/24 2:15

## 2019-07-14 ENCOUNTER — Encounter (INDEPENDENT_AMBULATORY_CARE_PROVIDER_SITE_OTHER): Payer: Medicare Other | Admitting: Ophthalmology

## 2019-07-15 ENCOUNTER — Other Ambulatory Visit: Payer: Self-pay

## 2019-07-15 ENCOUNTER — Encounter (INDEPENDENT_AMBULATORY_CARE_PROVIDER_SITE_OTHER): Payer: Medicare Other | Admitting: Ophthalmology

## 2019-07-15 DIAGNOSIS — H3411 Central retinal artery occlusion, right eye: Secondary | ICD-10-CM

## 2019-07-15 DIAGNOSIS — H34812 Central retinal vein occlusion, left eye, with macular edema: Secondary | ICD-10-CM | POA: Diagnosis not present

## 2019-07-15 DIAGNOSIS — H43813 Vitreous degeneration, bilateral: Secondary | ICD-10-CM | POA: Diagnosis not present

## 2019-07-15 DIAGNOSIS — I10 Essential (primary) hypertension: Secondary | ICD-10-CM | POA: Diagnosis not present

## 2019-07-15 DIAGNOSIS — H35033 Hypertensive retinopathy, bilateral: Secondary | ICD-10-CM

## 2019-07-17 ENCOUNTER — Inpatient Hospital Stay: Payer: Medicare Other

## 2019-07-17 ENCOUNTER — Telehealth: Payer: Self-pay | Admitting: Oncology

## 2019-07-17 ENCOUNTER — Other Ambulatory Visit: Payer: Self-pay

## 2019-07-17 ENCOUNTER — Inpatient Hospital Stay (HOSPITAL_BASED_OUTPATIENT_CLINIC_OR_DEPARTMENT_OTHER): Payer: Medicare Other | Admitting: Nurse Practitioner

## 2019-07-17 ENCOUNTER — Encounter: Payer: Self-pay | Admitting: Nurse Practitioner

## 2019-07-17 VITALS — BP 163/71 | HR 87 | Temp 98.5°F | Resp 18

## 2019-07-17 DIAGNOSIS — D591 Autoimmune hemolytic anemia, unspecified: Secondary | ICD-10-CM

## 2019-07-17 DIAGNOSIS — Z79899 Other long term (current) drug therapy: Secondary | ICD-10-CM | POA: Diagnosis not present

## 2019-07-17 DIAGNOSIS — R5383 Other fatigue: Secondary | ICD-10-CM | POA: Diagnosis not present

## 2019-07-17 DIAGNOSIS — R0609 Other forms of dyspnea: Secondary | ICD-10-CM | POA: Diagnosis not present

## 2019-07-17 DIAGNOSIS — Z7901 Long term (current) use of anticoagulants: Secondary | ICD-10-CM | POA: Diagnosis not present

## 2019-07-17 DIAGNOSIS — R630 Anorexia: Secondary | ICD-10-CM | POA: Diagnosis not present

## 2019-07-17 DIAGNOSIS — Z7952 Long term (current) use of systemic steroids: Secondary | ICD-10-CM

## 2019-07-17 DIAGNOSIS — I2510 Atherosclerotic heart disease of native coronary artery without angina pectoris: Secondary | ICD-10-CM

## 2019-07-17 DIAGNOSIS — E039 Hypothyroidism, unspecified: Secondary | ICD-10-CM | POA: Diagnosis not present

## 2019-07-17 DIAGNOSIS — E785 Hyperlipidemia, unspecified: Secondary | ICD-10-CM

## 2019-07-17 LAB — CMP (CANCER CENTER ONLY)
ALT: 15 U/L (ref 0–44)
AST: 20 U/L (ref 15–41)
Albumin: 4.4 g/dL (ref 3.5–5.0)
Alkaline Phosphatase: 66 U/L (ref 38–126)
Anion gap: 10 (ref 5–15)
BUN: 24 mg/dL — ABNORMAL HIGH (ref 8–23)
CO2: 27 mmol/L (ref 22–32)
Calcium: 9.7 mg/dL (ref 8.9–10.3)
Chloride: 93 mmol/L — ABNORMAL LOW (ref 98–111)
Creatinine: 1.02 mg/dL — ABNORMAL HIGH (ref 0.44–1.00)
GFR, Est AFR Am: 56 mL/min — ABNORMAL LOW (ref >60)
GFR, Estimated: 49 mL/min — ABNORMAL LOW (ref >60)
Glucose, Bld: 165 mg/dL — ABNORMAL HIGH (ref 70–99)
Potassium: 4.5 mmol/L (ref 3.5–5.1)
Sodium: 130 mmol/L — ABNORMAL LOW (ref 135–145)
Total Bilirubin: 1.5 mg/dL — ABNORMAL HIGH (ref 0.3–1.2)
Total Protein: 6.9 g/dL (ref 6.5–8.1)

## 2019-07-17 LAB — CBC WITH DIFFERENTIAL (CANCER CENTER ONLY)
Abs Immature Granulocytes: 0.07 10*3/uL (ref 0.00–0.07)
Basophils Absolute: 0 10*3/uL (ref 0.0–0.1)
Basophils Relative: 0 %
Eosinophils Absolute: 0 10*3/uL (ref 0.0–0.5)
Eosinophils Relative: 0 %
HCT: 32.9 % — ABNORMAL LOW (ref 36.0–46.0)
Hemoglobin: 11.2 g/dL — ABNORMAL LOW (ref 12.0–15.0)
Immature Granulocytes: 1 %
Lymphocytes Relative: 11 %
Lymphs Abs: 0.9 10*3/uL (ref 0.7–4.0)
MCH: 30.3 pg (ref 26.0–34.0)
MCHC: 34 g/dL (ref 30.0–36.0)
MCV: 88.9 fL (ref 80.0–100.0)
Monocytes Absolute: 0.2 10*3/uL (ref 0.1–1.0)
Monocytes Relative: 3 %
Neutro Abs: 7.5 10*3/uL (ref 1.7–7.7)
Neutrophils Relative %: 85 %
Platelet Count: 228 10*3/uL (ref 150–400)
RBC: 3.7 MIL/uL — ABNORMAL LOW (ref 3.87–5.11)
RDW: 15.9 % — ABNORMAL HIGH (ref 11.5–15.5)
WBC Count: 8.8 10*3/uL (ref 4.0–10.5)
nRBC: 0 % (ref 0.0–0.2)

## 2019-07-17 LAB — LACTATE DEHYDROGENASE: LDH: 183 U/L (ref 98–192)

## 2019-07-17 NOTE — Progress Notes (Addendum)
[place] OFFICE PROGRESS NOTE   Diagnosis: Autoimmune hemolytic anemia  INTERVAL HISTORY:   Carol [name] returns prior to scheduled follow-up due to recurrent hemolysis.  CBC on 07/13/2019 returned with a hemoglobin of 10.7, reticulocyte count 6.3%.  Prednisone was increased to 40 mg daily beginning 07/13/2019.  She reports recently feeling more fatigued than typical.  She notes that she bruises easily.  She is short of breath only when she wears a mask.  No nausea or vomiting.  Appetite is "not great".  No fevers or sweats.  Objective:  Vital signs in last 24 hours:  Blood pressure (!) 163/71, pulse 87, temperature 98.5 F (36.9 C), temperature source Oral, resp. rate 18, SpO2 97 %.    HEENT: No thrush or ulcers. Lymphatics: No palpable cervical, supraclavicular or axillary lymph nodes. GI: Abdomen soft and nontender.  No hepatosplenomegaly. Vascular: No leg edema.  Chronic stasis change at the lower legs bilaterally.    Lab Results:  Lab Results  Component Value Date   WBC 8.8 07/17/2019   HGB 11.2 (L) 07/17/2019   HCT 32.9 (L) 07/17/2019   MCV 88.9 07/17/2019   PLT 228 07/17/2019   NEUTROABS 7.5 07/17/2019    Imaging:  No results found.  Medications: I have reviewed the patient's current medications.  Assessment/Plan: 1.Autoimmune hemolytic anemia, status post 2 units of packed red blood cells 05/14/2018, prednisone 05/15/2018, Solu-Medrol started 05/16/2018;prednisone 60 mg daily beginning 05/19/2018  Prednisone taper to 40 mg daily beginning 05/23/2018  Prednisone taper to 30 mg daily beginning 05/30/2018  Prednisone taper to 20 mg daily beginning 06/13/2018  Prednisone taper to 15 mg daily beginning 06/28/2018  Prednisone taper to 10 mg daily beginning 07/23/2018  Prednisone taper to 5 mg daily beginning 09/01/2018  Prednisone dose increased 09/16/2018 due to a rash,20 mg for 2 weeks followed by 10 mg per dermatology  Prednisone decreased to 5  mg daily beginning 10/14/2018  Prednisone decreased to 2.5 mg daily approximately 11/18/2018  Prednisone decreased to 2.5 mg every other day 12/09/2018  Prednisone increased to 2.5 mg daily 12/15/2018 due to recurrent rash  Prednisone changed to 1 mg every other day  Recurrent hemolysis 07/13/2019, prednisone 40 mg daily  2. History of coronary artery disease  3. Status post aortic valve replacement, maintained on Coumadin  4. Hyperlipidemia  5. Hypothyroidism  6.Hospitalization 08/17/2018 through 08/20/2018 with E. coli UTI    Disposition: Carol [name] appears to have recurrent hemolysis.  She resumed prednisone at a dose of 40 mg daily on 07/13/2019.  Review of the CBC from today shows improvement in the hemoglobin.  She will continue prednisone at the current dose and return for a follow-up CBC in 1 week.  She has responded to prednisone well in the past.  We had a preliminary discussion regarding treatment with Rituxan.  We reviewed potential toxicities including an allergic reaction, reactivation of hepatitis B, PML, neutropenia (potentially late onset).  Hepatitis B serologies were obtained today.  We will discuss further pending her response to the steroids.  She will return for labs in 1 week.  She will return for labs and follow-up in 3 weeks.  She will contact the office in the interim with any problems.  I contacted her daughter by phone with the above information following today's office visit.  Patient seen with Dr. Benay Spice.    [name] ANP/GNP-BC   07/17/2019  3:14 PM  This was a shared visit with [name].  Carol [name] has  progressive anemia with evidence of hemolysis.  She resumed prednisone earlier this week.  The plan is to continue prednisone at the current dose.  We discussed rituximab therapy.  She does not wish to begin rituximab at present.  Julieanne Manson, MD

## 2019-07-17 NOTE — Telephone Encounter (Signed)
Scheduled appointments by 07/24 los, patient received after visit summary and calender.

## 2019-07-18 LAB — HEPATITIS B SURFACE ANTIGEN: Hepatitis B Surface Ag: NEGATIVE

## 2019-07-18 LAB — HEPATITIS B CORE ANTIBODY, TOTAL: Hep B Core Total Ab: NEGATIVE

## 2019-07-20 ENCOUNTER — Ambulatory Visit: Payer: Medicare Other | Admitting: Oncology

## 2019-07-20 ENCOUNTER — Other Ambulatory Visit: Payer: Medicare Other

## 2019-07-24 ENCOUNTER — Other Ambulatory Visit: Payer: Self-pay

## 2019-07-24 ENCOUNTER — Inpatient Hospital Stay: Payer: Medicare Other

## 2019-07-24 ENCOUNTER — Telehealth: Payer: Self-pay

## 2019-07-24 ENCOUNTER — Other Ambulatory Visit: Payer: Medicare Other

## 2019-07-24 DIAGNOSIS — R5383 Other fatigue: Secondary | ICD-10-CM | POA: Diagnosis not present

## 2019-07-24 DIAGNOSIS — R0609 Other forms of dyspnea: Secondary | ICD-10-CM | POA: Diagnosis not present

## 2019-07-24 DIAGNOSIS — I2510 Atherosclerotic heart disease of native coronary artery without angina pectoris: Secondary | ICD-10-CM | POA: Diagnosis not present

## 2019-07-24 DIAGNOSIS — D591 Autoimmune hemolytic anemia, unspecified: Secondary | ICD-10-CM

## 2019-07-24 DIAGNOSIS — R630 Anorexia: Secondary | ICD-10-CM | POA: Diagnosis not present

## 2019-07-24 DIAGNOSIS — E785 Hyperlipidemia, unspecified: Secondary | ICD-10-CM | POA: Diagnosis not present

## 2019-07-24 LAB — CBC WITH DIFFERENTIAL (CANCER CENTER ONLY)
Abs Immature Granulocytes: 0.1 10*3/uL — ABNORMAL HIGH (ref 0.00–0.07)
Basophils Absolute: 0 10*3/uL (ref 0.0–0.1)
Basophils Relative: 0 %
Eosinophils Absolute: 0 10*3/uL (ref 0.0–0.5)
Eosinophils Relative: 0 %
HCT: 36.4 % (ref 36.0–46.0)
Hemoglobin: 12.4 g/dL (ref 12.0–15.0)
Immature Granulocytes: 1 %
Lymphocytes Relative: 9 %
Lymphs Abs: 1.3 10*3/uL (ref 0.7–4.0)
MCH: 30.5 pg (ref 26.0–34.0)
MCHC: 34.1 g/dL (ref 30.0–36.0)
MCV: 89.4 fL (ref 80.0–100.0)
Monocytes Absolute: 0.7 10*3/uL (ref 0.1–1.0)
Monocytes Relative: 5 %
Neutro Abs: 11.4 10*3/uL — ABNORMAL HIGH (ref 1.7–7.7)
Neutrophils Relative %: 85 %
Platelet Count: 199 10*3/uL (ref 150–400)
RBC: 4.07 MIL/uL (ref 3.87–5.11)
RDW: 17 % — ABNORMAL HIGH (ref 11.5–15.5)
WBC Count: 13.5 10*3/uL — ABNORMAL HIGH (ref 4.0–10.5)
nRBC: 0 % (ref 0.0–0.2)

## 2019-07-24 LAB — RETICULOCYTES
Immature Retic Fract: 12.4 % (ref 2.3–15.9)
RBC.: 4.1 MIL/uL (ref 3.87–5.11)
Retic Count, Absolute: 375.5 10*3/uL — ABNORMAL HIGH (ref 19.0–186.0)
Retic Ct Pct: 9.2 % — ABNORMAL HIGH (ref 0.4–3.1)

## 2019-07-24 NOTE — Telephone Encounter (Signed)
TC to Pt's daughter [name]) to relay message about improved hemoglobin 12.4 also relayed message to decrease the prednisone to 30 mg. Daughter verbalized understanding. No further problems or concerns noted.

## 2019-07-24 NOTE — Telephone Encounter (Signed)
-----   Message from Owens Shark, NP sent at 07/24/2019  1:04 PM EDT ----- Please let her know the hemoglobin is better.  She can decrease prednisone to 30 mg daily.  Follow-up as scheduled.

## 2019-07-27 ENCOUNTER — Ambulatory Visit: Payer: Medicare Other | Admitting: Oncology

## 2019-07-27 ENCOUNTER — Inpatient Hospital Stay: Payer: Medicare Other

## 2019-08-07 ENCOUNTER — Inpatient Hospital Stay (HOSPITAL_BASED_OUTPATIENT_CLINIC_OR_DEPARTMENT_OTHER): Payer: Medicare Other | Admitting: Nurse Practitioner

## 2019-08-07 ENCOUNTER — Other Ambulatory Visit: Payer: Self-pay

## 2019-08-07 ENCOUNTER — Telehealth: Payer: Self-pay | Admitting: Oncology

## 2019-08-07 ENCOUNTER — Inpatient Hospital Stay: Payer: Medicare Other | Attending: Nurse Practitioner

## 2019-08-07 ENCOUNTER — Encounter: Payer: Self-pay | Admitting: Nurse Practitioner

## 2019-08-07 VITALS — BP 164/78 | HR 88 | Temp 97.8°F | Resp 18 | Ht 64.0 in | Wt 142.4 lb

## 2019-08-07 DIAGNOSIS — Z7952 Long term (current) use of systemic steroids: Secondary | ICD-10-CM | POA: Diagnosis not present

## 2019-08-07 DIAGNOSIS — I2510 Atherosclerotic heart disease of native coronary artery without angina pectoris: Secondary | ICD-10-CM

## 2019-08-07 DIAGNOSIS — E785 Hyperlipidemia, unspecified: Secondary | ICD-10-CM | POA: Diagnosis not present

## 2019-08-07 DIAGNOSIS — Z952 Presence of prosthetic heart valve: Secondary | ICD-10-CM | POA: Diagnosis not present

## 2019-08-07 DIAGNOSIS — D591 Autoimmune hemolytic anemia, unspecified: Secondary | ICD-10-CM

## 2019-08-07 DIAGNOSIS — E039 Hypothyroidism, unspecified: Secondary | ICD-10-CM | POA: Diagnosis not present

## 2019-08-07 DIAGNOSIS — Z7901 Long term (current) use of anticoagulants: Secondary | ICD-10-CM | POA: Diagnosis not present

## 2019-08-07 LAB — LACTATE DEHYDROGENASE: LDH: 186 U/L (ref 98–192)

## 2019-08-07 LAB — CBC WITH DIFFERENTIAL (CANCER CENTER ONLY)
Abs Immature Granulocytes: 0.04 10*3/uL (ref 0.00–0.07)
Basophils Absolute: 0 10*3/uL (ref 0.0–0.1)
Basophils Relative: 0 %
Eosinophils Absolute: 0 10*3/uL (ref 0.0–0.5)
Eosinophils Relative: 0 %
HCT: 40.7 % (ref 36.0–46.0)
Hemoglobin: 13.5 g/dL (ref 12.0–15.0)
Immature Granulocytes: 0 %
Lymphocytes Relative: 10 %
Lymphs Abs: 1 10*3/uL (ref 0.7–4.0)
MCH: 30.5 pg (ref 26.0–34.0)
MCHC: 33.2 g/dL (ref 30.0–36.0)
MCV: 91.9 fL (ref 80.0–100.0)
Monocytes Absolute: 0.4 10*3/uL (ref 0.1–1.0)
Monocytes Relative: 3 %
Neutro Abs: 9.4 10*3/uL — ABNORMAL HIGH (ref 1.7–7.7)
Neutrophils Relative %: 87 %
Platelet Count: 137 10*3/uL — ABNORMAL LOW (ref 150–400)
RBC: 4.43 MIL/uL (ref 3.87–5.11)
RDW: 16.7 % — ABNORMAL HIGH (ref 11.5–15.5)
WBC Count: 10.9 10*3/uL — ABNORMAL HIGH (ref 4.0–10.5)
nRBC: 0 % (ref 0.0–0.2)

## 2019-08-07 LAB — CMP (CANCER CENTER ONLY)
ALT: 43 U/L (ref 0–44)
AST: 32 U/L (ref 15–41)
Albumin: 4.4 g/dL (ref 3.5–5.0)
Alkaline Phosphatase: 54 U/L (ref 38–126)
Anion gap: 9 (ref 5–15)
BUN: 20 mg/dL (ref 8–23)
CO2: 27 mmol/L (ref 22–32)
Calcium: 9.4 mg/dL (ref 8.9–10.3)
Chloride: 97 mmol/L — ABNORMAL LOW (ref 98–111)
Creatinine: 0.96 mg/dL (ref 0.44–1.00)
GFR, Est AFR Am: >60 mL/min (ref >60)
GFR, Estimated: 52 mL/min — ABNORMAL LOW (ref >60)
Glucose, Bld: 103 mg/dL — ABNORMAL HIGH (ref 70–99)
Potassium: 4.7 mmol/L (ref 3.5–5.1)
Sodium: 133 mmol/L — ABNORMAL LOW (ref 135–145)
Total Bilirubin: 1.2 mg/dL (ref 0.3–1.2)
Total Protein: 6.6 g/dL (ref 6.5–8.1)

## 2019-08-07 LAB — RETICULOCYTES
Immature Retic Fract: 12.1 % (ref 2.3–15.9)
RBC.: 4.42 MIL/uL (ref 3.87–5.11)
Retic Count, Absolute: 217 10*3/uL — ABNORMAL HIGH (ref 19.0–186.0)
Retic Ct Pct: 4.9 % — ABNORMAL HIGH (ref 0.4–3.1)

## 2019-08-07 MED ORDER — PREDNISONE 20 MG PO TABS: 20.0000 mg | ORAL_TABLET | Freq: Every day | ORAL | 1 refills | Status: DC

## 2019-08-07 NOTE — Telephone Encounter (Signed)
Gave patient avs report and appointments for August/September.

## 2019-08-07 NOTE — Progress Notes (Signed)
  [place] OFFICE PROGRESS NOTE   Diagnosis: Autoimmune hemolytic anemia  INTERVAL HISTORY:   Carol [name] returns as scheduled.  Prednisone was tapered to 30 mg daily 07/24/2019 at which time the hemoglobin was improved to 12.4.  She denies mouth sores.  No leg swelling.  She denies significant shortness of breath.  No fever or cough.  Appetite is better.  Objective:  Vital signs in last 24 hours:  Blood pressure (!) 164/78, pulse 88, temperature 97.8 F (36.6 C), temperature source Temporal, resp. rate 18, height 5\' 4"  (1.626 m), weight 142 lb 6.4 oz (64.6 kg), SpO2 98 %.    HEENT: No thrush or ulcers. GI: Abdomen soft and nontender.  No hepatosplenomegaly. Vascular: No leg edema.  Chronic stasis change at the lower legs bilaterally. Neuro: Alert and oriented.   Lab Results:  Lab Results  Component Value Date   WBC 10.9 (H) 08/07/2019   HGB 13.5 08/07/2019   HCT 40.7 08/07/2019   MCV 91.9 08/07/2019   PLT 137 (L) 08/07/2019   NEUTROABS 9.4 (H) 08/07/2019    Imaging:  No results found.  Medications: I have reviewed the patient's current medications.  Assessment/Plan: 1.Autoimmune hemolytic anemia, status post 2 units of packed red blood cells 05/14/2018, prednisone 05/15/2018, Solu-Medrol started 05/16/2018;prednisone 60 mg daily beginning 05/19/2018  Prednisone taper to 40 mg daily beginning 05/23/2018  Prednisone taper to 30 mg daily beginning 05/30/2018  Prednisone taper to 20 mg daily beginning 06/13/2018  Prednisone taper to 15 mg daily beginning 06/28/2018  Prednisone taper to 10 mg daily beginning 07/23/2018  Prednisone taper to 5 mg daily beginning 09/01/2018  Prednisone dose increased 09/16/2018 due to a rash,20 mg for 2 weeks followed by 10 mg per dermatology  Prednisone decreased to 5 mg daily beginning 10/14/2018  Prednisone decreased to 2.5 mg daily approximately 11/18/2018  Prednisone decreased to 2.5 mg every other day 12/09/2018   Prednisone increased to 2.5 mg daily 12/15/2018 due to recurrent rash  Prednisone changed to 1 mg every other day  Recurrent hemolysis 07/13/2019, prednisone 40 mg daily  Prednisone taper to 30 mg daily beginning 07/24/2019  Prednisone taper to 20 mg daily beginning 08/07/2019  2. History of coronary artery disease  3. Status post aortic valve replacement, maintained on Coumadin  4. Hyperlipidemia  5. Hypothyroidism  6.Hospitalization 08/17/2018 through 08/20/2018 with E. coli UTI  Disposition: [name] appears stable.  The hemoglobin is further improved.  Current prednisone dose is 30 mg daily.  She has been on this dose for about 2 weeks.  She will decrease prednisone to 20 mg daily.  She will return for a follow-up CBC in 10 days.  We will see her in follow-up in approximately 3 weeks.  She will contact the office in the interim with any problems.    [name] ANP/GNP-BC   08/07/2019  12:39 PM

## 2019-08-17 ENCOUNTER — Other Ambulatory Visit: Payer: Self-pay

## 2019-08-17 ENCOUNTER — Inpatient Hospital Stay: Payer: Medicare Other

## 2019-08-17 DIAGNOSIS — Z952 Presence of prosthetic heart valve: Secondary | ICD-10-CM | POA: Diagnosis not present

## 2019-08-17 DIAGNOSIS — E785 Hyperlipidemia, unspecified: Secondary | ICD-10-CM | POA: Diagnosis not present

## 2019-08-17 DIAGNOSIS — D591 Autoimmune hemolytic anemia, unspecified: Secondary | ICD-10-CM

## 2019-08-17 DIAGNOSIS — I2510 Atherosclerotic heart disease of native coronary artery without angina pectoris: Secondary | ICD-10-CM | POA: Diagnosis not present

## 2019-08-17 DIAGNOSIS — E039 Hypothyroidism, unspecified: Secondary | ICD-10-CM | POA: Diagnosis not present

## 2019-08-17 DIAGNOSIS — Z7901 Long term (current) use of anticoagulants: Secondary | ICD-10-CM | POA: Diagnosis not present

## 2019-08-17 LAB — CBC WITH DIFFERENTIAL (CANCER CENTER ONLY)
Abs Immature Granulocytes: 0.03 10*3/uL (ref 0.00–0.07)
Basophils Absolute: 0 10*3/uL (ref 0.0–0.1)
Basophils Relative: 0 %
Eosinophils Absolute: 0 10*3/uL (ref 0.0–0.5)
Eosinophils Relative: 0 %
HCT: 39.5 % (ref 36.0–46.0)
Hemoglobin: 13.3 g/dL (ref 12.0–15.0)
Immature Granulocytes: 0 %
Lymphocytes Relative: 9 %
Lymphs Abs: 0.9 10*3/uL (ref 0.7–4.0)
MCH: 30.7 pg (ref 26.0–34.0)
MCHC: 33.7 g/dL (ref 30.0–36.0)
MCV: 91.2 fL (ref 80.0–100.0)
Monocytes Absolute: 0.3 10*3/uL (ref 0.1–1.0)
Monocytes Relative: 3 %
Neutro Abs: 8.8 10*3/uL — ABNORMAL HIGH (ref 1.7–7.7)
Neutrophils Relative %: 88 %
Platelet Count: 172 10*3/uL (ref 150–400)
RBC: 4.33 MIL/uL (ref 3.87–5.11)
RDW: 16 % — ABNORMAL HIGH (ref 11.5–15.5)
WBC Count: 10.1 10*3/uL (ref 4.0–10.5)
nRBC: 0 % (ref 0.0–0.2)

## 2019-08-17 LAB — RETICULOCYTES
Immature Retic Fract: 8.8 % (ref 2.3–15.9)
RBC.: 4.28 MIL/uL (ref 3.87–5.11)
Retic Count, Absolute: 206.3 10*3/uL — ABNORMAL HIGH (ref 19.0–186.0)
Retic Ct Pct: 4.8 % — ABNORMAL HIGH (ref 0.4–3.1)

## 2019-08-18 ENCOUNTER — Telehealth: Payer: Self-pay

## 2019-08-18 NOTE — Telephone Encounter (Signed)
-----   Message from Owens Shark, NP sent at 08/18/2019 10:25 AM EDT ----- Please let her know labs look good.  Continue prednisone at current dose.  Follow-up as scheduled.

## 2019-08-18 NOTE — Telephone Encounter (Signed)
Per Lattie Haw Pt informed labs were good and to continue current dose of prednisone. Pt aware of appointment on 09/01/19 at 1145.

## 2019-08-20 DIAGNOSIS — L821 Other seborrheic keratosis: Secondary | ICD-10-CM | POA: Diagnosis not present

## 2019-08-20 DIAGNOSIS — Z85828 Personal history of other malignant neoplasm of skin: Secondary | ICD-10-CM | POA: Diagnosis not present

## 2019-08-20 DIAGNOSIS — D692 Other nonthrombocytopenic purpura: Secondary | ICD-10-CM | POA: Diagnosis not present

## 2019-08-20 DIAGNOSIS — L570 Actinic keratosis: Secondary | ICD-10-CM | POA: Diagnosis not present

## 2019-08-25 DIAGNOSIS — H3530 Unspecified macular degeneration: Secondary | ICD-10-CM | POA: Diagnosis not present

## 2019-08-25 DIAGNOSIS — M6281 Muscle weakness (generalized): Secondary | ICD-10-CM | POA: Diagnosis not present

## 2019-08-25 DIAGNOSIS — I4819 Other persistent atrial fibrillation: Secondary | ICD-10-CM | POA: Diagnosis not present

## 2019-08-25 DIAGNOSIS — M1990 Unspecified osteoarthritis, unspecified site: Secondary | ICD-10-CM | POA: Diagnosis not present

## 2019-09-01 ENCOUNTER — Other Ambulatory Visit: Payer: Self-pay

## 2019-09-01 ENCOUNTER — Inpatient Hospital Stay: Payer: Medicare Other | Attending: Nurse Practitioner | Admitting: Oncology

## 2019-09-01 ENCOUNTER — Inpatient Hospital Stay: Payer: Medicare Other

## 2019-09-01 VITALS — BP 160/82 | HR 73 | Temp 98.7°F | Resp 17 | Ht 64.0 in | Wt 144.2 lb

## 2019-09-01 DIAGNOSIS — Z7901 Long term (current) use of anticoagulants: Secondary | ICD-10-CM | POA: Insufficient documentation

## 2019-09-01 DIAGNOSIS — E039 Hypothyroidism, unspecified: Secondary | ICD-10-CM | POA: Insufficient documentation

## 2019-09-01 DIAGNOSIS — D591 Autoimmune hemolytic anemia, unspecified: Secondary | ICD-10-CM

## 2019-09-01 DIAGNOSIS — I2510 Atherosclerotic heart disease of native coronary artery without angina pectoris: Secondary | ICD-10-CM | POA: Diagnosis not present

## 2019-09-01 DIAGNOSIS — Z952 Presence of prosthetic heart valve: Secondary | ICD-10-CM | POA: Insufficient documentation

## 2019-09-01 DIAGNOSIS — Z79899 Other long term (current) drug therapy: Secondary | ICD-10-CM | POA: Insufficient documentation

## 2019-09-01 DIAGNOSIS — E785 Hyperlipidemia, unspecified: Secondary | ICD-10-CM | POA: Insufficient documentation

## 2019-09-01 DIAGNOSIS — Z7952 Long term (current) use of systemic steroids: Secondary | ICD-10-CM | POA: Insufficient documentation

## 2019-09-01 LAB — CBC WITH DIFFERENTIAL (CANCER CENTER ONLY)
Abs Immature Granulocytes: 0.06 10*3/uL (ref 0.00–0.07)
Basophils Absolute: 0 10*3/uL (ref 0.0–0.1)
Basophils Relative: 0 %
Eosinophils Absolute: 0 10*3/uL (ref 0.0–0.5)
Eosinophils Relative: 0 %
HCT: 38.6 % (ref 36.0–46.0)
Hemoglobin: 13 g/dL (ref 12.0–15.0)
Immature Granulocytes: 1 %
Lymphocytes Relative: 7 %
Lymphs Abs: 0.8 10*3/uL (ref 0.7–4.0)
MCH: 31.2 pg (ref 26.0–34.0)
MCHC: 33.7 g/dL (ref 30.0–36.0)
MCV: 92.6 fL (ref 80.0–100.0)
Monocytes Absolute: 0.5 10*3/uL (ref 0.1–1.0)
Monocytes Relative: 4 %
Neutro Abs: 9.5 10*3/uL — ABNORMAL HIGH (ref 1.7–7.7)
Neutrophils Relative %: 88 %
Platelet Count: 186 10*3/uL (ref 150–400)
RBC: 4.17 MIL/uL (ref 3.87–5.11)
RDW: 16 % — ABNORMAL HIGH (ref 11.5–15.5)
WBC Count: 10.9 10*3/uL — ABNORMAL HIGH (ref 4.0–10.5)
nRBC: 0 % (ref 0.0–0.2)

## 2019-09-01 LAB — RETICULOCYTES
Immature Retic Fract: 12.8 % (ref 2.3–15.9)
RBC.: 4.19 MIL/uL (ref 3.87–5.11)
Retic Count, Absolute: 207.4 10*3/uL — ABNORMAL HIGH (ref 19.0–186.0)
Retic Ct Pct: 5 % — ABNORMAL HIGH (ref 0.4–3.1)

## 2019-09-01 MED ORDER — PREDNISONE 5 MG PO TABS: 15.0000 mg | ORAL_TABLET | Freq: Every day | ORAL | 1 refills | Status: DC

## 2019-09-01 NOTE — Progress Notes (Signed)
  [place] OFFICE PROGRESS NOTE   Diagnosis: Autoimmune hemolytic anemia  INTERVAL HISTORY:   Ms. Basulto is maintained on prednisone at a dose of 20 mg daily.  She feels well.  Good appetite.  No dyspnea.  No new complaint.  Objective:  Vital signs in last 24 hours:  Blood pressure (!) 187/83, pulse 73, temperature 98.7 F (37.1 C), temperature source Temporal, resp. rate 17, height 5\' 4"  (1.626 m), weight 144 lb 3.2 oz (65.4 kg), SpO2 96 %.   Limited physical examination secondary to distancing with the COVID pandemic HEENT: No thrush   Lab Results:  Lab Results  Component Value Date   WBC 10.9 (H) 09/01/2019   HGB 13.0 09/01/2019   HCT 38.6 09/01/2019   MCV 92.6 09/01/2019   PLT 186 09/01/2019   NEUTROABS 9.5 (H) 09/01/2019  Reticulocyte count 207, 5%  CMP  Lab Results  Component Value Date   NA 133 (L) 08/07/2019   K 4.7 08/07/2019   CL 97 (L) 08/07/2019   CO2 27 08/07/2019   GLUCOSE 103 (H) 08/07/2019   BUN 20 08/07/2019   CREATININE 0.96 08/07/2019   CALCIUM 9.4 08/07/2019   PROT 6.6 08/07/2019   ALBUMIN 4.4 08/07/2019   AST 32 08/07/2019   ALT 43 08/07/2019   ALKPHOS 54 08/07/2019   BILITOT 1.2 08/07/2019   GFRNONAA 52 (L) 08/07/2019   GFRAA >60 08/07/2019     Medications: I have reviewed the patient's current medications.   Assessment/Plan: 1. Autoimmune hemolytic anemia, status post 2 units of packed red blood cells 05/14/2018, prednisone 05/15/2018, Solu-Medrol started 05/16/2018;prednisone 60 mg daily beginning 05/19/2018  Prednisone taper to 40 mg daily beginning 05/23/2018  Prednisone taper to 30 mg daily beginning 05/30/2018  Prednisone taper to 20 mg daily beginning 06/13/2018  Prednisone taper to 15 mg daily beginning 06/28/2018  Prednisone taper to 10 mg daily beginning 07/23/2018  Prednisone taper to 5 mg daily beginning 09/01/2018  Prednisone dose increased 09/16/2018 due to a rash,20 mg for 2 weeks followed by 10 mg  per dermatology  Prednisone decreased to 5 mg daily beginning 10/14/2018  Prednisone decreased to 2.5 mg daily approximately 11/18/2018  Prednisone decreased to 2.5 mg every other day 12/09/2018  Prednisone increased to 2.5 mg daily 12/15/2018 due to recurrent rash  Prednisone changed to 1 mg every other day  Recurrent hemolysis 07/13/2019, prednisone 40 mg daily  Prednisone taper to 30 mg daily beginning 07/24/2019  Prednisone taper to 20 mg daily beginning 08/07/2019  Prednisone taper to 15 mg daily beginning 09/01/2019  2. History of coronary artery disease  3. Status post aortic valve replacement, maintained on Coumadin  4. Hyperlipidemia  5. Hypothyroidism  6.Hospitalization 08/17/2018 through 08/20/2018 with E. coli UTI    Disposition: Carol [name] appears stable.  The hemoglobin is stable, but she continues to have a reticulocytosis.  She is still hemolyzing.  We tapered the prednisone to 15 mg daily.  She would like to hold on rituximab for as long as possible.  We will continue a slow prednisone taper as tolerated.  She will return for an office visit and CBC in approximately 4 weeks.  Betsy Coder, MD  09/01/2019  1:05 PM

## 2019-09-02 ENCOUNTER — Encounter (INDEPENDENT_AMBULATORY_CARE_PROVIDER_SITE_OTHER): Payer: Medicare Other | Admitting: Ophthalmology

## 2019-09-02 DIAGNOSIS — H35033 Hypertensive retinopathy, bilateral: Secondary | ICD-10-CM

## 2019-09-02 DIAGNOSIS — H43813 Vitreous degeneration, bilateral: Secondary | ICD-10-CM

## 2019-09-02 DIAGNOSIS — H3411 Central retinal artery occlusion, right eye: Secondary | ICD-10-CM | POA: Diagnosis not present

## 2019-09-02 DIAGNOSIS — H34812 Central retinal vein occlusion, left eye, with macular edema: Secondary | ICD-10-CM

## 2019-09-02 DIAGNOSIS — I10 Essential (primary) hypertension: Secondary | ICD-10-CM | POA: Diagnosis not present

## 2019-09-03 ENCOUNTER — Telehealth: Payer: Self-pay | Admitting: Oncology

## 2019-09-03 NOTE — Telephone Encounter (Signed)
Called and spoke with patient. Confirmed date and time of appt  ° °

## 2019-09-09 DIAGNOSIS — H3530 Unspecified macular degeneration: Secondary | ICD-10-CM | POA: Diagnosis not present

## 2019-09-09 DIAGNOSIS — M1990 Unspecified osteoarthritis, unspecified site: Secondary | ICD-10-CM | POA: Diagnosis not present

## 2019-09-09 DIAGNOSIS — I4819 Other persistent atrial fibrillation: Secondary | ICD-10-CM | POA: Diagnosis not present

## 2019-09-09 DIAGNOSIS — M6281 Muscle weakness (generalized): Secondary | ICD-10-CM | POA: Diagnosis not present

## 2019-09-17 DIAGNOSIS — H3530 Unspecified macular degeneration: Secondary | ICD-10-CM | POA: Diagnosis not present

## 2019-09-17 DIAGNOSIS — M1990 Unspecified osteoarthritis, unspecified site: Secondary | ICD-10-CM | POA: Diagnosis not present

## 2019-09-17 DIAGNOSIS — M6281 Muscle weakness (generalized): Secondary | ICD-10-CM | POA: Diagnosis not present

## 2019-09-17 DIAGNOSIS — I4819 Other persistent atrial fibrillation: Secondary | ICD-10-CM | POA: Diagnosis not present

## 2019-09-22 DIAGNOSIS — M1990 Unspecified osteoarthritis, unspecified site: Secondary | ICD-10-CM | POA: Diagnosis not present

## 2019-09-22 DIAGNOSIS — H3530 Unspecified macular degeneration: Secondary | ICD-10-CM | POA: Diagnosis not present

## 2019-09-22 DIAGNOSIS — M6281 Muscle weakness (generalized): Secondary | ICD-10-CM | POA: Diagnosis not present

## 2019-09-22 DIAGNOSIS — I4819 Other persistent atrial fibrillation: Secondary | ICD-10-CM | POA: Diagnosis not present

## 2019-09-28 ENCOUNTER — Telehealth: Payer: Self-pay | Admitting: Oncology

## 2019-09-28 ENCOUNTER — Ambulatory Visit: Payer: Medicare Other | Admitting: Nurse Practitioner

## 2019-09-28 ENCOUNTER — Other Ambulatory Visit: Payer: Medicare Other

## 2019-09-28 NOTE — Telephone Encounter (Signed)
Returned patient's phone call regarding rescheduling 10/09 appointment time, per patient's request appointment time has been rescheduled.

## 2019-10-02 ENCOUNTER — Encounter: Payer: Self-pay | Admitting: Nurse Practitioner

## 2019-10-02 ENCOUNTER — Inpatient Hospital Stay: Payer: Medicare Other | Attending: Nurse Practitioner

## 2019-10-02 ENCOUNTER — Ambulatory Visit: Payer: Medicare Other | Admitting: Nurse Practitioner

## 2019-10-02 ENCOUNTER — Other Ambulatory Visit: Payer: Self-pay

## 2019-10-02 ENCOUNTER — Other Ambulatory Visit: Payer: Medicare Other

## 2019-10-02 ENCOUNTER — Inpatient Hospital Stay (HOSPITAL_BASED_OUTPATIENT_CLINIC_OR_DEPARTMENT_OTHER): Payer: Medicare Other | Admitting: Nurse Practitioner

## 2019-10-02 VITALS — BP 172/83 | HR 77 | Temp 98.7°F | Resp 19 | Ht 64.0 in | Wt 148.9 lb

## 2019-10-02 DIAGNOSIS — Z79899 Other long term (current) drug therapy: Secondary | ICD-10-CM | POA: Diagnosis not present

## 2019-10-02 DIAGNOSIS — Z7901 Long term (current) use of anticoagulants: Secondary | ICD-10-CM | POA: Diagnosis not present

## 2019-10-02 DIAGNOSIS — I2510 Atherosclerotic heart disease of native coronary artery without angina pectoris: Secondary | ICD-10-CM | POA: Diagnosis not present

## 2019-10-02 DIAGNOSIS — D591 Autoimmune hemolytic anemia, unspecified: Secondary | ICD-10-CM | POA: Insufficient documentation

## 2019-10-02 DIAGNOSIS — Z952 Presence of prosthetic heart valve: Secondary | ICD-10-CM | POA: Insufficient documentation

## 2019-10-02 DIAGNOSIS — R030 Elevated blood-pressure reading, without diagnosis of hypertension: Secondary | ICD-10-CM | POA: Insufficient documentation

## 2019-10-02 DIAGNOSIS — E039 Hypothyroidism, unspecified: Secondary | ICD-10-CM | POA: Diagnosis not present

## 2019-10-02 DIAGNOSIS — E785 Hyperlipidemia, unspecified: Secondary | ICD-10-CM | POA: Insufficient documentation

## 2019-10-02 LAB — RETICULOCYTES
Immature Retic Fract: 12.3 % (ref 2.3–15.9)
RBC.: 4.37 MIL/uL (ref 3.87–5.11)
Retic Count, Absolute: 252.1 10*3/uL — ABNORMAL HIGH (ref 19.0–186.0)
Retic Ct Pct: 5.8 % — ABNORMAL HIGH (ref 0.4–3.1)

## 2019-10-02 LAB — CBC WITH DIFFERENTIAL (CANCER CENTER ONLY)
Abs Immature Granulocytes: 0.05 10*3/uL (ref 0.00–0.07)
Basophils Absolute: 0 10*3/uL (ref 0.0–0.1)
Basophils Relative: 0 %
Eosinophils Absolute: 0 10*3/uL (ref 0.0–0.5)
Eosinophils Relative: 0 %
HCT: 40.7 % (ref 36.0–46.0)
Hemoglobin: 13.7 g/dL (ref 12.0–15.0)
Immature Granulocytes: 1 %
Lymphocytes Relative: 8 %
Lymphs Abs: 0.9 10*3/uL (ref 0.7–4.0)
MCH: 31.6 pg (ref 26.0–34.0)
MCHC: 33.7 g/dL (ref 30.0–36.0)
MCV: 94 fL (ref 80.0–100.0)
Monocytes Absolute: 0.6 10*3/uL (ref 0.1–1.0)
Monocytes Relative: 6 %
Neutro Abs: 9.3 10*3/uL — ABNORMAL HIGH (ref 1.7–7.7)
Neutrophils Relative %: 85 %
Platelet Count: 165 10*3/uL (ref 150–400)
RBC: 4.33 MIL/uL (ref 3.87–5.11)
RDW: 15.6 % — ABNORMAL HIGH (ref 11.5–15.5)
WBC Count: 11 10*3/uL — ABNORMAL HIGH (ref 4.0–10.5)
nRBC: 0 % (ref 0.0–0.2)

## 2019-10-02 NOTE — Progress Notes (Signed)
  [place] OFFICE PROGRESS NOTE   Diagnosis: Autoimmune hemolytic anemia  INTERVAL HISTORY:   Carol [name] returns as scheduled.  Prednisone was tapered to 15 mg daily beginning 09/01/2019.  She overall is feeling well.  No mouth discomfort.  No leg swelling.  Her blood pressure was elevated in the office.  She reports normal blood pressure readings at her residence.  Objective:  Vital signs in last 24 hours:  Blood pressure (!) 172/83, pulse 77, temperature 98.7 F (37.1 C), temperature source Oral, resp. rate 19, height 5\' 4"  (1.626 m), weight 148 lb 14.4 oz (67.5 kg), SpO2 98 %.   Limited examination due to COVID-19 HEENT: No thrush or ulcers. Vascular: No leg edema. Neuro: Alert and oriented.   Lab Results:  Lab Results  Component Value Date   WBC 11.0 (H) 10/02/2019   HGB 13.7 10/02/2019   HCT 40.7 10/02/2019   MCV 94.0 10/02/2019   PLT 165 10/02/2019   NEUTROABS 9.3 (H) 10/02/2019    Imaging:  No results found.  Medications: I have reviewed the patient's current medications.  Assessment/Plan: 1. Autoimmune hemolytic anemia, status post 2 units of packed red blood cells 05/14/2018, prednisone 05/15/2018, Solu-Medrol started 05/16/2018;prednisone 60 mg daily beginning 05/19/2018  Prednisone taper to 40 mg daily beginning 05/23/2018  Prednisone taper to 30 mg daily beginning 05/30/2018  Prednisone taper to 20 mg daily beginning 06/13/2018  Prednisone taper to 15 mg daily beginning 06/28/2018  Prednisone taper to 10 mg daily beginning 07/23/2018  Prednisone taper to 5 mg daily beginning 09/01/2018  Prednisone dose increased 09/16/2018 due to a rash,20 mg for 2 weeks followed by 10 mg per dermatology  Prednisone decreased to 5 mg daily beginning 10/14/2018  Prednisone decreased to 2.5 mg daily approximately 11/18/2018  Prednisone decreased to 2.5 mg every other day 12/09/2018  Prednisone increased to 2.5 mg daily 12/15/2018 due to recurrent rash   Prednisone changed to 1 mg every other day  Recurrent hemolysis 07/13/2019, prednisone 40 mg daily  Prednisone taper to 30 mg daily beginning 07/24/2019  Prednisone taper to 20 mg daily beginning 08/07/2019  Prednisone taper to 15 mg daily beginning 09/01/2019  Prednisone taper to 10 mg daily beginning 10/02/2019  2. History of coronary artery disease  3. Status post aortic valve replacement, maintained on Coumadin  4. Hyperlipidemia  5. Hypothyroidism  6.Hospitalization 08/17/2018 through 08/20/2018 with E. coli UTI  Disposition: Carol [name] remains stable from a hematologic standpoint.  Hemoglobin remains in normal range, reticulocyte count with stable mild elevation.  She will taper prednisone from 15 mg daily to 10 mg daily.  She will return for lab and follow-up in 1 month.  She will contact the office in the interim with any problems.  Repeat blood pressure here was better.  She will have the blood pressure checked at her residence.    [name] ANP/GNP-BC   10/02/2019  11:03 AM

## 2019-10-05 ENCOUNTER — Telehealth: Payer: Self-pay | Admitting: Oncology

## 2019-10-05 NOTE — Telephone Encounter (Signed)
Called and spoke with patient. Confirmed appt  °

## 2019-10-06 ENCOUNTER — Telehealth: Payer: Self-pay | Admitting: Cardiology

## 2019-10-06 NOTE — Telephone Encounter (Signed)
°*  STAT* If patient is at the pharmacy, call can be transferred to refill team.   1. Which medications need to be refilled? (please list name of each medication and dose if known)Losartan 100mg   2. Which pharmacy/location (including street and city if local pharmacy) is medication to be sent to? [facility] [employer] [address]  3. Do they need a 30 day or 90 day supply? [place]

## 2019-10-07 ENCOUNTER — Other Ambulatory Visit: Payer: Self-pay

## 2019-10-07 MED ORDER — LOSARTAN POTASSIUM 100 MG PO TABS: 100.0000 mg | ORAL_TABLET | Freq: Every day | ORAL | 1 refills | Status: DC

## 2019-10-21 ENCOUNTER — Encounter (INDEPENDENT_AMBULATORY_CARE_PROVIDER_SITE_OTHER): Payer: Medicare Other | Admitting: Ophthalmology

## 2019-10-22 ENCOUNTER — Other Ambulatory Visit: Payer: Self-pay | Admitting: Oncology

## 2019-10-23 ENCOUNTER — Encounter (INDEPENDENT_AMBULATORY_CARE_PROVIDER_SITE_OTHER): Payer: Medicare Other | Admitting: Ophthalmology

## 2019-10-23 ENCOUNTER — Ambulatory Visit (INDEPENDENT_AMBULATORY_CARE_PROVIDER_SITE_OTHER): Payer: Medicare Other | Admitting: Cardiology

## 2019-10-23 ENCOUNTER — Encounter: Payer: Self-pay | Admitting: Cardiology

## 2019-10-23 ENCOUNTER — Other Ambulatory Visit: Payer: Self-pay

## 2019-10-23 VITALS — BP 126/58 | HR 96 | Ht 62.0 in | Wt 148.0 lb

## 2019-10-23 DIAGNOSIS — I2510 Atherosclerotic heart disease of native coronary artery without angina pectoris: Secondary | ICD-10-CM | POA: Diagnosis not present

## 2019-10-23 DIAGNOSIS — Z1329 Encounter for screening for other suspected endocrine disorder: Secondary | ICD-10-CM

## 2019-10-23 DIAGNOSIS — E782 Mixed hyperlipidemia: Secondary | ICD-10-CM

## 2019-10-23 DIAGNOSIS — Z952 Presence of prosthetic heart valve: Secondary | ICD-10-CM | POA: Diagnosis not present

## 2019-10-23 DIAGNOSIS — I480 Paroxysmal atrial fibrillation: Secondary | ICD-10-CM | POA: Diagnosis not present

## 2019-10-23 NOTE — Patient Instructions (Signed)
Medication Instructions:  Your physician recommends that you continue on your current medications as directed. Please refer to the Current Medication list given to you today.  *If you need a refill on your cardiac medications before your next appointment, please call your pharmacy*  Lab Work: Your physician recommends that you have a BMP, TSH, and hepatic drawn today.  If you have labs (blood work) drawn today and your tests are completely normal, you will receive your results only by: Marland Kitchen MyChart Message (if you have MyChart) OR . A paper copy in the mail If you have any lab test that is abnormal or we need to change your treatment, we will call you to review the results.  Testing/Procedures: NONE  Follow-Up: At [facility], you and your health needs are our priority.  As part of our continuing mission to provide you with exceptional heart care, we have created designated Provider Care Teams.  These Care Teams include your primary Cardiologist (physician) and Advanced Practice Providers (APPs -  Physician Assistants and Nurse Practitioners) who all work together to provide you with the care you need, when you need it.  Your next appointment:   6 months  The format for your next appointment:   In Person  Provider:   Jyl Heinz, MD

## 2019-10-23 NOTE — Progress Notes (Signed)
Cardiology Office Note:    Date:  10/23/2019   ID:  Carol [name], DOB 1928-04-21, MRN [id number]  PCP:  Carol Austin, MD (Inactive)  Cardiologist:  Carol Lindau, MD   Referring MD: No ref. provider found    ASSESSMENT:    1. PAF (paroxysmal atrial fibrillation) ([place])   2. Coronary artery disease involving native coronary artery of native heart without angina pectoris   3. S/P AVR and CABG   4. Mixed hyperlipidemia    PLAN:    In order of problems listed above:  1. Coronary artery disease: Secondary prevention stressed with the patient.  Importance of compliance with diet and medication stressed she vocalized understanding. 2. Paroxysmal atrial fibrillation:I discussed with the patient atrial fibrillation, disease process. Management and therapy including rate and rhythm control, anticoagulation benefits and potential risks were discussed extensively with the patient. Patient had multiple questions which were answered to patient's satisfaction. 3. Post aortic valve replacement and CABG: Stable at this time 4. Mixed dyslipidemia: Patient is on statin therapy and tolerating it well she will have blood work today including liver panel 5. Patient will be seen in follow-up appointment in 6 months or earlier if the patient has any concerns    Medication Adjustments/Labs and Tests Ordered: Current medicines are reviewed at length with the patient today.  Concerns regarding medicines are outlined above.  No orders of the defined types were placed in this encounter.  No orders of the defined types were placed in this encounter.    No chief complaint on file.    History of Present Illness:    Carol [name] is a 85 y.o. female.  Patient has past medical history of paroxysmal atrial fibrillation, central retinal arterial occlusion, aortic valve replacement and CABG surgery.  She denies any problems at this time and takes care of activities of daily living.  She  ambulates age appropriately.  Her daughter is very supportive and accompanies her for this visit.  At the time of my evaluation, the patient is alert awake oriented and in no distress.  Past Medical History:  Diagnosis Date  . Acute blood loss anemia 05/16/2018  . Aortic stenosis   . Coronary artery disease    Cath 1/13 left main  . 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 Dyspnea   . Hyperlipidemia   . Hypertension   . Hypertensive heart disease without CHF   . Hypothyroidism   . Lumbar disc disease   . Lung nodules    Sees [facility], thought to be benign  . Obesity   . Spinal stenosis   . Villous adenoma of rectum     Past Surgical History:  Procedure Laterality Date  . AORTIC VALVE REPLACEMENT  12/28/2011   Procedure: AORTIC VALVE REPLACEMENT (AVR);  Surgeon: Grace Isaac, MD;  Location: [facility];  Service: Open Heart Surgery;  Laterality: N/A;  . CARDIAC CATHETERIZATION  2013  . CHOLECYSTECTOMY    . CORONARY ARTERY BYPASS GRAFT  12/28/2011   Procedure: CORONARY ARTERY BYPASS GRAFTING (CABG);  Surgeon: Grace Isaac, MD;  Location: [facility];  Service: Open Heart Surgery;  Laterality: N/A;  . HERNIA REPAIR    . LEFT AND RIGHT HEART CATHETERIZATION WITH CORONARY ANGIOGRAM N/A 12/26/2011   Procedure: LEFT AND RIGHT HEART CATHETERIZATION WITH CORONARY ANGIOGRAM;  Surgeon: Jacolyn Reedy, MD;  Location: [facility] C[facility] LAB;  Service: Cardiovascular;  Laterality: N/A;  . LUMBAR LAMINECTOMY    . PARTIAL COLECTOMY  2008   Villous adenoma    Current Medications: Current Meds  Medication Sig  . ALPRAZolam (XANAX) 0.25 MG tablet Take 0.25 mg by mouth at bedtime.  Marland Kitchen atorvastatin (LIPITOR) 20 MG tablet Take 1 tablet (20 mg total) by mouth at bedtime.  Marland Kitchen BESIVANCE 0.6 % SUSP Place 1 drop into the left eye See admin instructions. Place 1 drop left eye 4 times daily for 2 days after Avastin eye injections  . Calcium Carbonate-Vitamin D (CALCIUM 600 + D PO) Take 1 tablet by mouth  every morning.   . docusate sodium (COLACE) 100 MG capsule Take 100 mg by mouth at bedtime.   Marland Kitchen ELIQUIS 2.5 MG TABS tablet TAKE 1 TABLET BY MOUTH TWICE A DAY  . fexofenadine (ALLEGRA) 180 MG tablet Take 180 mg by mouth daily.  Marland Kitchen gabapentin (NEURONTIN) 100 MG capsule Take 100 mg by mouth 3 (three) times daily.   Marland Kitchen levothyroxine (SYNTHROID, LEVOTHROID) 88 MCG tablet Take 88 mcg by mouth daily before breakfast.   . losartan (COZAAR) 100 MG tablet Take 1 tablet (100 mg total) by mouth daily.  . metoprolol tartrate (LOPRESSOR) 25 MG tablet Take 1 tablet (25 mg total) by mouth 2 (two) times daily.  . Multiple Vitamin (MULITIVITAMIN WITH MINERALS) TABS Take 1 tablet by mouth daily.    Marland Kitchen oxybutynin (DITROPAN) 5 MG tablet Take 5 mg by mouth every other day.  . polyethylene glycol (MIRALAX / GLYCOLAX) packet Take 17 g by mouth daily as needed for mild constipation.   . predniSONE (DELTASONE) 5 MG tablet Take 2 tablets (10 mg total) by mouth daily with breakfast.  . Probiotic Product (PROBIOTIC PO) Take 1 capsule by mouth daily.     Allergies:   Ibuprofen, Nucynta [tapentadol], Ace inhibitors, Doxycycline, Hydrocodone, Nsaids, Adhesive [tape], and Latex   Social History   Socioeconomic History  . Marital status: Widowed    Spouse name: Not on file  . Number of children: 4  . Years of education: 50  . Highest education level: 12th grade  Occupational History  . Occupation: retired Curator  . Financial resource strain: Not hard at all  . Food insecurity    Worry: Never true    Inability: Never true  . Transportation needs    Medical: No    Non-medical: No  Tobacco Use  . Smoking status: Former Smoker    Packs/day: 0.50    Years: 10.00    Pack years: 5.00    Types: Cigarettes    Quit date: 12/24/1978    Years since quitting: 40.8  . Smokeless tobacco: Never Used  Substance and Sexual Activity  . Alcohol use: No  . Drug use: No  . Sexual activity: Not Currently    Birth  control/protection: None  Lifestyle  . Physical activity    Days per week: 2 days    Minutes per session: 30 min  . Stress: Only a little  Relationships  . Social connections    Talks on phone: More than three times a week    Gets together: More than three times a week    Attends religious service: More than 4 times per year    Active member of club or organization: Yes    Attends meetings of clubs or organizations: More than 4 times per year    Relationship status: Widowed  Other Topics Concern  . Not on file  Social History Narrative   Lives at [place]  Phone [phone]   [address]   Caffeine use: Drinks coffee- 2 cups per day at the most per pt     Family History: The patient's family history includes Breast cancer in her sister; Cancer in her mother and sister; Heart attack in her father; Heart disease in her father; Heart failure in her brother; Lymphoma in her sister.  ROS:   Please see the history of present illness.    All other systems reviewed and are negative.  EKGs/Labs/Other Studies Reviewed:    The following studies were reviewed today: I discussed my findings with the patient at extensive length   Recent Labs: 08/07/2019: ALT 43; BUN 20; Creatinine 0.96; Potassium 4.7; Sodium 133 10/02/2019: Hemoglobin 13.7; Platelet Count 165  Recent Lipid Panel No results found for: CHOL, TRIG, HDL, CHOLHDL, VLDL, LDLCALC, LDLDIRECT  Physical Exam:    VS:  BP (!) 126/58 (BP Location: Left Arm, Patient Position: Sitting, Cuff Size: Normal)   Pulse 96   Ht 5\' 2"  (1.575 m)   Wt 148 lb (67.1 kg)   SpO2 94%   BMI 27.07 kg/m     Wt Readings from Last 3 Encounters:  10/23/19 148 lb (67.1 kg)  10/02/19 148 lb 14.4 oz (67.5 kg)  09/01/19 144 lb 3.2 oz (65.4 kg)     GEN: Patient is in no acute distress HEENT: Normal NECK: No JVD; No carotid bruits LYMPHATICS: No lymphadenopathy CARDIAC: Hear sounds regular, 2/6 systolic murmur at the apex.  RESPIRATORY:  Clear to auscultation without rales, wheezing or rhonchi  ABDOMEN: Soft, non-tender, non-distended MUSCULOSKELETAL:  No edema; No deformity  SKIN: Warm and dry NEUROLOGIC:  Alert and oriented x 3 PSYCHIATRIC:  Normal affect   Signed, Carol Lindau, MD  10/23/2019 3:32 PM    [facility]

## 2019-10-23 NOTE — Addendum Note (Signed)
Addended by: Beckey Rutter on: 10/23/2019 04:02 PM   Modules accepted: Orders

## 2019-10-24 ENCOUNTER — Other Ambulatory Visit: Payer: Self-pay | Admitting: Cardiology

## 2019-10-24 LAB — HEPATIC FUNCTION PANEL
ALT: 23 IU/L (ref 0–32)
AST: 21 IU/L (ref 0–40)
Albumin: 4.5 g/dL (ref 3.6–4.6)
Alkaline Phosphatase: 52 IU/L (ref 39–117)
Bilirubin Total: 0.8 mg/dL (ref 0.0–1.2)
Bilirubin, Direct: 0.29 mg/dL (ref 0.00–0.40)
Total Protein: 6.3 g/dL (ref 6.0–8.5)

## 2019-10-24 LAB — BASIC METABOLIC PANEL
BUN/Creatinine Ratio: 23 (ref 12–28)
BUN: 21 mg/dL (ref 8–27)
CO2: 24 mmol/L (ref 20–29)
Calcium: 9.9 mg/dL (ref 8.7–10.3)
Chloride: 93 mmol/L — ABNORMAL LOW (ref 96–106)
Creatinine, Ser: 0.91 mg/dL (ref 0.57–1.00)
GFR calc Af Amer: 65 mL/min/{1.73_m2} (ref >59)
GFR calc non Af Amer: 56 mL/min/{1.73_m2} — ABNORMAL LOW (ref >59)
Glucose: 140 mg/dL — ABNORMAL HIGH (ref 65–99)
Potassium: 4.2 mmol/L (ref 3.5–5.2)
Sodium: 130 mmol/L — ABNORMAL LOW (ref 134–144)

## 2019-10-24 LAB — TSH: TSH: 1.4 u[IU]/mL (ref 0.450–4.500)

## 2019-10-28 ENCOUNTER — Encounter (INDEPENDENT_AMBULATORY_CARE_PROVIDER_SITE_OTHER): Payer: Medicare Other | Admitting: Ophthalmology

## 2019-10-28 DIAGNOSIS — H35033 Hypertensive retinopathy, bilateral: Secondary | ICD-10-CM | POA: Diagnosis not present

## 2019-10-28 DIAGNOSIS — H43813 Vitreous degeneration, bilateral: Secondary | ICD-10-CM | POA: Diagnosis not present

## 2019-10-28 DIAGNOSIS — H3411 Central retinal artery occlusion, right eye: Secondary | ICD-10-CM

## 2019-10-28 DIAGNOSIS — H34812 Central retinal vein occlusion, left eye, with macular edema: Secondary | ICD-10-CM | POA: Diagnosis not present

## 2019-10-28 DIAGNOSIS — I10 Essential (primary) hypertension: Secondary | ICD-10-CM

## 2019-10-30 ENCOUNTER — Other Ambulatory Visit: Payer: Medicare Other

## 2019-10-30 ENCOUNTER — Ambulatory Visit: Payer: Medicare Other | Admitting: Oncology

## 2019-11-02 ENCOUNTER — Other Ambulatory Visit: Payer: Self-pay

## 2019-11-02 ENCOUNTER — Inpatient Hospital Stay (HOSPITAL_BASED_OUTPATIENT_CLINIC_OR_DEPARTMENT_OTHER): Payer: Medicare Other | Admitting: Oncology

## 2019-11-02 ENCOUNTER — Inpatient Hospital Stay: Payer: Medicare Other | Attending: Oncology

## 2019-11-02 VITALS — BP 149/79 | HR 64 | Temp 98.2°F | Resp 18 | Ht 62.0 in | Wt 147.7 lb

## 2019-11-02 DIAGNOSIS — E785 Hyperlipidemia, unspecified: Secondary | ICD-10-CM | POA: Insufficient documentation

## 2019-11-02 DIAGNOSIS — I2510 Atherosclerotic heart disease of native coronary artery without angina pectoris: Secondary | ICD-10-CM

## 2019-11-02 DIAGNOSIS — Z952 Presence of prosthetic heart valve: Secondary | ICD-10-CM | POA: Diagnosis not present

## 2019-11-02 DIAGNOSIS — Z79899 Other long term (current) drug therapy: Secondary | ICD-10-CM | POA: Diagnosis not present

## 2019-11-02 DIAGNOSIS — D591 Autoimmune hemolytic anemia, unspecified: Secondary | ICD-10-CM

## 2019-11-02 DIAGNOSIS — Z87440 Personal history of urinary (tract) infections: Secondary | ICD-10-CM | POA: Diagnosis not present

## 2019-11-02 DIAGNOSIS — E039 Hypothyroidism, unspecified: Secondary | ICD-10-CM | POA: Insufficient documentation

## 2019-11-02 DIAGNOSIS — Z7901 Long term (current) use of anticoagulants: Secondary | ICD-10-CM | POA: Insufficient documentation

## 2019-11-02 LAB — CBC WITH DIFFERENTIAL (CANCER CENTER ONLY)
Abs Immature Granulocytes: 0.03 10*3/uL (ref 0.00–0.07)
Basophils Absolute: 0 10*3/uL (ref 0.0–0.1)
Basophils Relative: 0 %
Eosinophils Absolute: 0 10*3/uL (ref 0.0–0.5)
Eosinophils Relative: 0 %
HCT: 44.2 % (ref 36.0–46.0)
Hemoglobin: 14.8 g/dL (ref 12.0–15.0)
Immature Granulocytes: 0 %
Lymphocytes Relative: 10 %
Lymphs Abs: 1 10*3/uL (ref 0.7–4.0)
MCH: 31 pg (ref 26.0–34.0)
MCHC: 33.5 g/dL (ref 30.0–36.0)
MCV: 92.7 fL (ref 80.0–100.0)
Monocytes Absolute: 0.5 10*3/uL (ref 0.1–1.0)
Monocytes Relative: 5 %
Neutro Abs: 8.4 10*3/uL — ABNORMAL HIGH (ref 1.7–7.7)
Neutrophils Relative %: 85 %
Platelet Count: 175 10*3/uL (ref 150–400)
RBC: 4.77 MIL/uL (ref 3.87–5.11)
RDW: 14.5 % (ref 11.5–15.5)
WBC Count: 9.9 10*3/uL (ref 4.0–10.5)
nRBC: 0 % (ref 0.0–0.2)

## 2019-11-02 LAB — RETICULOCYTES
Immature Retic Fract: 9.9 % (ref 2.3–15.9)
RBC.: 4.7 MIL/uL (ref 3.87–5.11)
Retic Count, Absolute: 191.8 10*3/uL — ABNORMAL HIGH (ref 19.0–186.0)
Retic Ct Pct: 4.1 % — ABNORMAL HIGH (ref 0.4–3.1)

## 2019-11-02 NOTE — Progress Notes (Signed)
  [place] OFFICE PROGRESS NOTE   Diagnosis: Autoimmune hemolytic anemia INTERVAL HISTORY:   Carol [name] returns as scheduled.  She is now taking prednisone at a dose of 10 mg daily.  She reports tolerating the prednisone well.  No complaint.  Objective:  Vital signs in last 24 hours:  Blood pressure (!) 149/79, pulse 64, temperature 98.2 F (36.8 C), temperature source Temporal, resp. rate 18, height 5\' 2"  (1.575 m), weight 147 lb 11.2 oz (67 kg), SpO2 96 %.   Limited physical examination secondary to distancing with the Covid pandemic HEENT: No thrush   Lab Results:  Lab Results  Component Value Date   WBC 9.9 11/02/2019   HGB 14.8 11/02/2019   HCT 44.2 11/02/2019   MCV 92.7 11/02/2019   PLT 175 11/02/2019   NEUTROABS 8.4 (H) 11/02/2019    CMP  Lab Results  Component Value Date   NA 130 (L) 10/23/2019   K 4.2 10/23/2019   CL 93 (L) 10/23/2019   CO2 24 10/23/2019   GLUCOSE 140 (H) 10/23/2019   BUN 21 10/23/2019   CREATININE 0.91 10/23/2019   CALCIUM 9.9 10/23/2019   PROT 6.3 10/23/2019   ALBUMIN 4.5 10/23/2019   AST 21 10/23/2019   ALT 23 10/23/2019   ALKPHOS 52 10/23/2019   BILITOT 0.8 10/23/2019   GFRNONAA 56 (L) 10/23/2019   GFRAA 65 10/23/2019    Medications: I have reviewed the patient's current medications.   Assessment/Plan: 1. Autoimmune hemolytic anemia, status post 2 units of packed red blood cells 05/14/2018, prednisone 05/15/2018, Solu-Medrol started 05/16/2018;prednisone 60 mg daily beginning 05/19/2018  Prednisone taper to 40 mg daily beginning 05/23/2018  Prednisone taper to 30 mg daily beginning 05/30/2018  Prednisone taper to 20 mg daily beginning 06/13/2018  Prednisone taper to 15 mg daily beginning 06/28/2018  Prednisone taper to 10 mg daily beginning 07/23/2018  Prednisone taper to 5 mg daily beginning 09/01/2018  Prednisone dose increased 09/16/2018 due to a rash,20 mg for 2 weeks followed by 10 mg per dermatology   Prednisone decreased to 5 mg daily beginning 10/14/2018  Prednisone decreased to 2.5 mg daily approximately 11/18/2018  Prednisone decreased to 2.5 mg every other day 12/09/2018  Prednisone increased to 2.5 mg daily 12/15/2018 due to recurrent rash  Prednisone changed to 1 mg every other day  Recurrent hemolysis 07/13/2019, prednisone 40 mg daily  Prednisone taper to 30 mg daily beginning 07/24/2019  Prednisone taper to 20 mg daily beginning 08/07/2019  Prednisone taper to 15 mg daily beginning 09/01/2019  Prednisone taper to 10 mg daily beginning 10/02/2019  Prednisone taper to 5 mg daily beginning 11/02/2019  2. History of coronary artery disease  3. Status post aortic valve replacement, maintained on Coumadin  4. Hyperlipidemia  5. Hypothyroidism  6.Hospitalization 08/17/2018 through 08/20/2018 with E. coli UTI    Disposition: Carol [name] appears unchanged.  Hemoglobin remains in the normal range.  We tapered the prednisone to 5 mg daily.  She will call for symptoms of anemia.  She will return for an office visit and CBC in 1 month.  Betsy Coder, MD  11/02/2019  12:50 PM

## 2019-11-03 ENCOUNTER — Telehealth: Payer: Self-pay

## 2019-11-03 NOTE — Telephone Encounter (Signed)
-----   Message from Jenean Lindau, MD sent at 10/26/2019 11:29 AM EST ----- The results of the study is unremarkable. Please inform patient. I will discuss in detail at next appointment. Cc  primary care/referring physician Jenean Lindau, MD 10/26/2019 11:29 AM thank you

## 2019-11-03 NOTE — Telephone Encounter (Signed)
Results relayed, copy sent to Dr. Inda Merlin

## 2019-11-04 ENCOUNTER — Telehealth: Payer: Self-pay | Admitting: Oncology

## 2019-11-04 NOTE — Telephone Encounter (Signed)
Called and spoke with patient. Confirmed appt  °

## 2019-12-01 ENCOUNTER — Inpatient Hospital Stay: Payer: Medicare Other

## 2019-12-01 ENCOUNTER — Encounter: Payer: Self-pay | Admitting: Nurse Practitioner

## 2019-12-01 ENCOUNTER — Other Ambulatory Visit: Payer: Self-pay

## 2019-12-01 ENCOUNTER — Inpatient Hospital Stay: Payer: Medicare Other | Attending: Oncology | Admitting: Nurse Practitioner

## 2019-12-01 ENCOUNTER — Telehealth: Payer: Self-pay | Admitting: Nurse Practitioner

## 2019-12-01 VITALS — BP 166/83 | HR 88 | Temp 98.7°F | Resp 20 | Ht 62.0 in | Wt 148.1 lb

## 2019-12-01 DIAGNOSIS — E039 Hypothyroidism, unspecified: Secondary | ICD-10-CM | POA: Insufficient documentation

## 2019-12-01 DIAGNOSIS — I2510 Atherosclerotic heart disease of native coronary artery without angina pectoris: Secondary | ICD-10-CM | POA: Diagnosis not present

## 2019-12-01 DIAGNOSIS — Z952 Presence of prosthetic heart valve: Secondary | ICD-10-CM | POA: Insufficient documentation

## 2019-12-01 DIAGNOSIS — Z79899 Other long term (current) drug therapy: Secondary | ICD-10-CM | POA: Insufficient documentation

## 2019-12-01 DIAGNOSIS — Z7901 Long term (current) use of anticoagulants: Secondary | ICD-10-CM | POA: Insufficient documentation

## 2019-12-01 DIAGNOSIS — Z87440 Personal history of urinary (tract) infections: Secondary | ICD-10-CM | POA: Diagnosis not present

## 2019-12-01 DIAGNOSIS — D591 Autoimmune hemolytic anemia, unspecified: Secondary | ICD-10-CM | POA: Diagnosis not present

## 2019-12-01 DIAGNOSIS — E785 Hyperlipidemia, unspecified: Secondary | ICD-10-CM | POA: Insufficient documentation

## 2019-12-01 LAB — CBC WITH DIFFERENTIAL (CANCER CENTER ONLY)
Abs Immature Granulocytes: 0.02 10*3/uL (ref 0.00–0.07)
Basophils Absolute: 0 10*3/uL (ref 0.0–0.1)
Basophils Relative: 0 %
Eosinophils Absolute: 0 10*3/uL (ref 0.0–0.5)
Eosinophils Relative: 1 %
HCT: 40.8 % (ref 36.0–46.0)
Hemoglobin: 13.9 g/dL (ref 12.0–15.0)
Immature Granulocytes: 0 %
Lymphocytes Relative: 12 %
Lymphs Abs: 1 10*3/uL (ref 0.7–4.0)
MCH: 31.2 pg (ref 26.0–34.0)
MCHC: 34.1 g/dL (ref 30.0–36.0)
MCV: 91.5 fL (ref 80.0–100.0)
Monocytes Absolute: 0.5 10*3/uL (ref 0.1–1.0)
Monocytes Relative: 6 %
Neutro Abs: 6.6 10*3/uL (ref 1.7–7.7)
Neutrophils Relative %: 81 %
Platelet Count: 177 10*3/uL (ref 150–400)
RBC: 4.46 MIL/uL (ref 3.87–5.11)
RDW: 14.1 % (ref 11.5–15.5)
WBC Count: 8.1 10*3/uL (ref 4.0–10.5)
nRBC: 0 % (ref 0.0–0.2)

## 2019-12-01 LAB — RETICULOCYTES
Immature Retic Fract: 9.6 % (ref 2.3–15.9)
RBC.: 4.41 MIL/uL (ref 3.87–5.11)
Retic Count, Absolute: 156.1 10*3/uL (ref 19.0–186.0)
Retic Ct Pct: 3.5 % — ABNORMAL HIGH (ref 0.4–3.1)

## 2019-12-01 NOTE — Telephone Encounter (Signed)
Scheduled appt per 12/8 los - gave patient AVS and calender per los

## 2019-12-01 NOTE — Progress Notes (Signed)
  [place] OFFICE PROGRESS NOTE   Diagnosis: Autoimmune hemolytic anemia  INTERVAL HISTORY:   Carol [name] returns as scheduled.  Current prednisone dose is 5 mg daily.  Energy level is poor.  No leg swelling.  No mouth sores.  She reports a good appetite.  No fever, cough, shortness of breath.  Objective:  Vital signs in last 24 hours:  Blood pressure (!) 166/83, pulse 88, temperature 98.7 F (37.1 C), temperature source Temporal, resp. rate 20, height 5\' 2"  (1.575 m), weight 148 lb 1.6 oz (67.2 kg), SpO2 96 %.    HEENT: No thrush or ulcers. GI: Abdomen soft and nontender.  No hepatosplenomegaly. Vascular: No leg edema.  Chronic stasis change lower legs bilaterally. Neuro: Alert and oriented.   Lab Results:  Lab Results  Component Value Date   WBC 8.1 12/01/2019   HGB 13.9 12/01/2019   HCT 40.8 12/01/2019   MCV 91.5 12/01/2019   PLT 177 12/01/2019   NEUTROABS 6.6 12/01/2019    Imaging:  No results found.  Medications: I have reviewed the patient's current medications.  Assessment/Plan: 1.Autoimmune hemolytic anemia, status post 2 units of packed red blood cells 05/14/2018, prednisone 05/15/2018, Solu-Medrol started 05/16/2018;prednisone 60 mg daily beginning 05/19/2018  Prednisone taper to 40 mg daily beginning 05/23/2018  Prednisone taper to 30 mg daily beginning 05/30/2018  Prednisone taper to 20 mg daily beginning 06/13/2018  Prednisone taper to 15 mg daily beginning 06/28/2018  Prednisone taper to 10 mg daily beginning 07/23/2018  Prednisone taper to 5 mg daily beginning 09/01/2018  Prednisone dose increased 09/16/2018 due to a rash,20 mg for 2 weeks followed by 10 mg per dermatology  Prednisone decreased to 5 mg daily beginning 10/14/2018  Prednisone decreased to 2.5 mg daily approximately 11/18/2018  Prednisone decreased to 2.5 mg every other day 12/09/2018  Prednisone increased to 2.5 mg daily 12/15/2018 due to recurrent rash  Prednisone  changed to 1 mg every other day  Recurrent hemolysis 07/13/2019, prednisone 40 mg daily  Prednisone taper to 30 mg daily beginning 07/24/2019  Prednisone taper to 20 mg daily beginning 08/07/2019  Prednisone taper to 15 mg daily beginning 09/01/2019  Prednisone taper to 10 mg daily beginning 10/02/2019  Prednisone taper to 5 mg daily beginning 11/02/2019  Prednisone 5 mg daily continued  2. History of coronary artery disease  3. Status post aortic valve replacement, maintained on Coumadin  4. Hyperlipidemia  5. Hypothyroidism  6.Hospitalization 08/17/2018 through 08/20/2018 with E. coli UTI   Disposition: Carol [name] appears unchanged.  Hemoglobin remains in normal range.  She will continue prednisone 5 mg daily.  We reviewed signs/symptoms suggestive of progressive anemia.  She understands to contact the office should she develop any of these.  She will return for a CBC in 6 weeks, CBC and office visit in 12 weeks.  Plan reviewed with Dr. Benay Spice.    [name] ANP/GNP-BC   12/01/2019  3:23 PM

## 2019-12-09 ENCOUNTER — Other Ambulatory Visit: Payer: Self-pay

## 2019-12-09 ENCOUNTER — Encounter (INDEPENDENT_AMBULATORY_CARE_PROVIDER_SITE_OTHER): Payer: Medicare Other | Admitting: Ophthalmology

## 2019-12-09 DIAGNOSIS — H35033 Hypertensive retinopathy, bilateral: Secondary | ICD-10-CM | POA: Diagnosis not present

## 2019-12-09 DIAGNOSIS — H3411 Central retinal artery occlusion, right eye: Secondary | ICD-10-CM | POA: Diagnosis not present

## 2019-12-09 DIAGNOSIS — H34812 Central retinal vein occlusion, left eye, with macular edema: Secondary | ICD-10-CM

## 2019-12-09 DIAGNOSIS — I10 Essential (primary) hypertension: Secondary | ICD-10-CM | POA: Diagnosis not present

## 2019-12-09 DIAGNOSIS — H43813 Vitreous degeneration, bilateral: Secondary | ICD-10-CM

## 2019-12-10 ENCOUNTER — Other Ambulatory Visit: Payer: Self-pay | Admitting: Cardiology

## 2019-12-11 ENCOUNTER — Other Ambulatory Visit: Payer: Self-pay | Admitting: Oncology

## 2020-01-11 ENCOUNTER — Inpatient Hospital Stay: Payer: Medicare Other | Attending: Oncology

## 2020-01-11 ENCOUNTER — Other Ambulatory Visit: Payer: Self-pay

## 2020-01-11 DIAGNOSIS — D591 Autoimmune hemolytic anemia, unspecified: Secondary | ICD-10-CM | POA: Insufficient documentation

## 2020-01-11 LAB — RETICULOCYTES
Immature Retic Fract: 9.6 % (ref 2.3–15.9)
RBC.: 4.73 MIL/uL (ref 3.87–5.11)
Retic Count, Absolute: 171.2 10*3/uL (ref 19.0–186.0)
Retic Ct Pct: 3.6 % — ABNORMAL HIGH (ref 0.4–3.1)

## 2020-01-11 LAB — CBC WITH DIFFERENTIAL (CANCER CENTER ONLY)
Abs Immature Granulocytes: 0.02 10*3/uL (ref 0.00–0.07)
Basophils Absolute: 0 10*3/uL (ref 0.0–0.1)
Basophils Relative: 0 %
Eosinophils Absolute: 0.1 10*3/uL (ref 0.0–0.5)
Eosinophils Relative: 1 %
HCT: 42.7 % (ref 36.0–46.0)
Hemoglobin: 14.6 g/dL (ref 12.0–15.0)
Immature Granulocytes: 0 %
Lymphocytes Relative: 14 %
Lymphs Abs: 1.3 10*3/uL (ref 0.7–4.0)
MCH: 30.7 pg (ref 26.0–34.0)
MCHC: 34.2 g/dL (ref 30.0–36.0)
MCV: 89.7 fL (ref 80.0–100.0)
Monocytes Absolute: 0.5 10*3/uL (ref 0.1–1.0)
Monocytes Relative: 6 %
Neutro Abs: 6.9 10*3/uL (ref 1.7–7.7)
Neutrophils Relative %: 79 %
Platelet Count: 165 10*3/uL (ref 150–400)
RBC: 4.76 MIL/uL (ref 3.87–5.11)
RDW: 14.1 % (ref 11.5–15.5)
WBC Count: 8.9 10*3/uL (ref 4.0–10.5)
nRBC: 0 % (ref 0.0–0.2)

## 2020-01-12 ENCOUNTER — Telehealth: Payer: Self-pay

## 2020-01-12 NOTE — Telephone Encounter (Signed)
TC to Pt Per [name] NP informed Pt lab levels look good, Pt. Requested to know value of HGB relayed to Pt HGB was 14.6 and to continue taking 5 mg of prednisone daily an FU as scheduled. Informed Pt of 02/22/20 appointment for lab and Dr. Benay Spice. Pt verbalized understanding. No further problems or concerns noted.

## 2020-01-12 NOTE — Telephone Encounter (Signed)
-----   Message from Owens Shark, NP sent at 01/12/2020  8:41 AM EST ----- Please let her know labs look good. Continue prednisone 5 mg daily. F/u as scheduled.

## 2020-01-20 ENCOUNTER — Encounter (INDEPENDENT_AMBULATORY_CARE_PROVIDER_SITE_OTHER): Payer: Medicare Other | Admitting: Ophthalmology

## 2020-01-20 DIAGNOSIS — I10 Essential (primary) hypertension: Secondary | ICD-10-CM | POA: Diagnosis not present

## 2020-01-20 DIAGNOSIS — H35033 Hypertensive retinopathy, bilateral: Secondary | ICD-10-CM | POA: Diagnosis not present

## 2020-01-20 DIAGNOSIS — H3411 Central retinal artery occlusion, right eye: Secondary | ICD-10-CM

## 2020-01-20 DIAGNOSIS — H34812 Central retinal vein occlusion, left eye, with macular edema: Secondary | ICD-10-CM

## 2020-01-20 DIAGNOSIS — H43813 Vitreous degeneration, bilateral: Secondary | ICD-10-CM

## 2020-02-22 ENCOUNTER — Inpatient Hospital Stay: Payer: Medicare Other | Attending: Oncology | Admitting: Oncology

## 2020-02-22 ENCOUNTER — Inpatient Hospital Stay: Payer: Medicare Other

## 2020-02-22 ENCOUNTER — Other Ambulatory Visit: Payer: Self-pay

## 2020-02-22 VITALS — BP 180/96 | HR 94 | Temp 98.3°F | Resp 18 | Ht 62.0 in | Wt 146.4 lb

## 2020-02-22 DIAGNOSIS — Z952 Presence of prosthetic heart valve: Secondary | ICD-10-CM | POA: Insufficient documentation

## 2020-02-22 DIAGNOSIS — R21 Rash and other nonspecific skin eruption: Secondary | ICD-10-CM | POA: Insufficient documentation

## 2020-02-22 DIAGNOSIS — I2510 Atherosclerotic heart disease of native coronary artery without angina pectoris: Secondary | ICD-10-CM | POA: Insufficient documentation

## 2020-02-22 DIAGNOSIS — E785 Hyperlipidemia, unspecified: Secondary | ICD-10-CM | POA: Diagnosis not present

## 2020-02-22 DIAGNOSIS — Z79899 Other long term (current) drug therapy: Secondary | ICD-10-CM | POA: Insufficient documentation

## 2020-02-22 DIAGNOSIS — E039 Hypothyroidism, unspecified: Secondary | ICD-10-CM | POA: Insufficient documentation

## 2020-02-22 DIAGNOSIS — Z87440 Personal history of urinary (tract) infections: Secondary | ICD-10-CM | POA: Diagnosis not present

## 2020-02-22 DIAGNOSIS — D591 Autoimmune hemolytic anemia, unspecified: Secondary | ICD-10-CM

## 2020-02-22 DIAGNOSIS — Z7901 Long term (current) use of anticoagulants: Secondary | ICD-10-CM | POA: Diagnosis not present

## 2020-02-22 LAB — CBC WITH DIFFERENTIAL (CANCER CENTER ONLY)
Abs Immature Granulocytes: 0.03 10*3/uL (ref 0.00–0.07)
Basophils Absolute: 0 10*3/uL (ref 0.0–0.1)
Basophils Relative: 0 %
Eosinophils Absolute: 0.1 10*3/uL (ref 0.0–0.5)
Eosinophils Relative: 1 %
HCT: 42.3 % (ref 36.0–46.0)
Hemoglobin: 14.1 g/dL (ref 12.0–15.0)
Immature Granulocytes: 0 %
Lymphocytes Relative: 15 %
Lymphs Abs: 1.4 10*3/uL (ref 0.7–4.0)
MCH: 30.5 pg (ref 26.0–34.0)
MCHC: 33.3 g/dL (ref 30.0–36.0)
MCV: 91.6 fL (ref 80.0–100.0)
Monocytes Absolute: 0.5 10*3/uL (ref 0.1–1.0)
Monocytes Relative: 6 %
Neutro Abs: 7.1 10*3/uL (ref 1.7–7.7)
Neutrophils Relative %: 78 %
Platelet Count: 173 10*3/uL (ref 150–400)
RBC: 4.62 MIL/uL (ref 3.87–5.11)
RDW: 14.3 % (ref 11.5–15.5)
WBC Count: 9.1 10*3/uL (ref 4.0–10.5)
nRBC: 0 % (ref 0.0–0.2)

## 2020-02-22 LAB — RETICULOCYTES
Immature Retic Fract: 12.8 % (ref 2.3–15.9)
RBC.: 4.63 MIL/uL (ref 3.87–5.11)
Retic Count, Absolute: 183.3 10*3/uL (ref 19.0–186.0)
Retic Ct Pct: 4 % — ABNORMAL HIGH (ref 0.4–3.1)

## 2020-02-22 NOTE — Progress Notes (Signed)
  [place] OFFICE PROGRESS NOTE   Diagnosis: Hemolytic anemia  INTERVAL HISTORY:   Carol [name] returns as scheduled.  She continues prednisone at a dose of 5 mg daily.  No complaint.  She has received both doses of the COVID-19 vaccine.  She reports a mild  rash that comes and goes.  Objective:  Vital signs in last 24 hours:  Blood pressure (!) 178/92, pulse 94, temperature 98.3 F (36.8 C), temperature source Temporal, resp. rate 18, height 5\' 2"  (1.575 m), weight 146 lb 6.4 oz (66.4 kg), SpO2 96 %.    HEENT: No thrush GI: No hepatosplenomegaly Vascular: No leg edema  Skin: Multiple flat less than 1 cm erythematous lesions over the abdominal wall  Portacath/PICC-without erythema  Lab Results:  Lab Results  Component Value Date   WBC 9.1 02/22/2020   HGB 14.1 02/22/2020   HCT 42.3 02/22/2020   MCV 91.6 02/22/2020   PLT 173 02/22/2020   NEUTROABS 7.1 02/22/2020    CMP  Lab Results  Component Value Date   NA 130 (L) 10/23/2019   K 4.2 10/23/2019   CL 93 (L) 10/23/2019   CO2 24 10/23/2019   GLUCOSE 140 (H) 10/23/2019   BUN 21 10/23/2019   CREATININE 0.91 10/23/2019   CALCIUM 9.9 10/23/2019   PROT 6.3 10/23/2019   ALBUMIN 4.5 10/23/2019   AST 21 10/23/2019   ALT 23 10/23/2019   ALKPHOS 52 10/23/2019   BILITOT 0.8 10/23/2019   GFRNONAA 56 (L) 10/23/2019   GFRAA 65 10/23/2019     Medications: I have reviewed the patient's current medications.   Assessment/Plan: 1.Autoimmune hemolytic anemia, status post 2 units of packed red blood cells 05/14/2018, prednisone 05/15/2018, Solu-Medrol started 05/16/2018;prednisone 60 mg daily beginning 05/19/2018  Prednisone taper to 40 mg daily beginning 05/23/2018  Prednisone taper to 30 mg daily beginning 05/30/2018  Prednisone taper to 20 mg daily beginning 06/13/2018  Prednisone taper to 15 mg daily beginning 06/28/2018  Prednisone taper to 10 mg daily beginning 07/23/2018  Prednisone taper to 5 mg daily  beginning 09/01/2018  Prednisone dose increased 09/16/2018 due to a rash,20 mg for 2 weeks followed by 10 mg per dermatology  Prednisone decreased to 5 mg daily beginning 10/14/2018  Prednisone decreased to 2.5 mg daily approximately 11/18/2018  Prednisone decreased to 2.5 mg every other day 12/09/2018  Prednisone increased to 2.5 mg daily 12/15/2018 due to recurrent rash  Prednisone changed to 1 mg every other day  Recurrent hemolysis 07/13/2019, prednisone 40 mg daily  Prednisone taper to 30 mg daily beginning 07/24/2019  Prednisone taper to 20 mg daily beginning 08/07/2019  Prednisone taper to 15 mg daily beginning 09/01/2019  Prednisone taper to 10 mg daily beginning 10/02/2019  Prednisone taper to 5 mg daily beginning 11/02/2019  Prednisone 5 mg daily continued  2. History of coronary artery disease  3. Status post aortic valve replacement, maintained on Coumadin  4. Hyperlipidemia  5. Hypothyroidism  6.Hospitalization 08/17/2018 through 08/20/2018 with E. coli UTI     Disposition: [name] appears stable.  The hemoglobin is stable.  She likely has ongoing low-grade hemolysis as evidenced by the mildly elevated reticulocyte count.  She will continue prednisone at a dose of 5 mg daily.  She will return for an office and lab visit in 3 months.  Carol Coder, MD  02/22/2020  12:43 PM

## 2020-02-23 ENCOUNTER — Telehealth: Payer: Self-pay | Admitting: Oncology

## 2020-02-23 NOTE — Telephone Encounter (Signed)
Scheduled per los. Called and spoke with patient. Confirmed appt 

## 2020-03-02 ENCOUNTER — Encounter (INDEPENDENT_AMBULATORY_CARE_PROVIDER_SITE_OTHER): Payer: Medicare Other | Admitting: Ophthalmology

## 2020-03-02 ENCOUNTER — Other Ambulatory Visit: Payer: Self-pay | Admitting: Cardiology

## 2020-03-02 DIAGNOSIS — H35033 Hypertensive retinopathy, bilateral: Secondary | ICD-10-CM | POA: Diagnosis not present

## 2020-03-02 DIAGNOSIS — H3411 Central retinal artery occlusion, right eye: Secondary | ICD-10-CM

## 2020-03-02 DIAGNOSIS — H34812 Central retinal vein occlusion, left eye, with macular edema: Secondary | ICD-10-CM | POA: Diagnosis not present

## 2020-03-02 DIAGNOSIS — I10 Essential (primary) hypertension: Secondary | ICD-10-CM

## 2020-03-02 DIAGNOSIS — H43813 Vitreous degeneration, bilateral: Secondary | ICD-10-CM

## 2020-04-13 ENCOUNTER — Encounter (INDEPENDENT_AMBULATORY_CARE_PROVIDER_SITE_OTHER): Payer: Medicare Other | Admitting: Ophthalmology

## 2020-04-13 DIAGNOSIS — I10 Essential (primary) hypertension: Secondary | ICD-10-CM

## 2020-04-13 DIAGNOSIS — H43813 Vitreous degeneration, bilateral: Secondary | ICD-10-CM

## 2020-04-13 DIAGNOSIS — H3411 Central retinal artery occlusion, right eye: Secondary | ICD-10-CM

## 2020-04-13 DIAGNOSIS — H34812 Central retinal vein occlusion, left eye, with macular edema: Secondary | ICD-10-CM | POA: Diagnosis not present

## 2020-04-13 DIAGNOSIS — H35033 Hypertensive retinopathy, bilateral: Secondary | ICD-10-CM

## 2020-05-04 ENCOUNTER — Other Ambulatory Visit: Payer: Self-pay | Admitting: Oncology

## 2020-05-25 ENCOUNTER — Other Ambulatory Visit: Payer: Self-pay | Admitting: Cardiology

## 2020-05-25 ENCOUNTER — Other Ambulatory Visit: Payer: Self-pay

## 2020-05-25 ENCOUNTER — Ambulatory Visit (INDEPENDENT_AMBULATORY_CARE_PROVIDER_SITE_OTHER): Payer: Medicare Other | Admitting: Cardiology

## 2020-05-25 ENCOUNTER — Encounter: Payer: Self-pay | Admitting: Cardiology

## 2020-05-25 VITALS — BP 108/78 | HR 93 | Ht 62.0 in | Wt 148.0 lb

## 2020-05-25 DIAGNOSIS — I2510 Atherosclerotic heart disease of native coronary artery without angina pectoris: Secondary | ICD-10-CM | POA: Diagnosis not present

## 2020-05-25 DIAGNOSIS — Z952 Presence of prosthetic heart valve: Secondary | ICD-10-CM

## 2020-05-25 DIAGNOSIS — D591 Autoimmune hemolytic anemia, unspecified: Secondary | ICD-10-CM

## 2020-05-25 DIAGNOSIS — I480 Paroxysmal atrial fibrillation: Secondary | ICD-10-CM

## 2020-05-25 DIAGNOSIS — E782 Mixed hyperlipidemia: Secondary | ICD-10-CM

## 2020-05-25 NOTE — Progress Notes (Signed)
Cardiology Office Note:    Date:  05/25/2020   ID:  Carol [name], DOB 04-Sep-1928, MRN [id number]  PCP:  Lujean Amel, MD  Cardiologist:  Jenean Lindau, MD   Referring MD: Lujean Amel, MD    ASSESSMENT:    1. PAF (paroxysmal atrial fibrillation) ([place])   2. Coronary artery disease involving native coronary artery of native heart without angina pectoris   3. S/P AVR and CABG   4. Mixed hyperlipidemia    PLAN:    In order of problems listed above:  1. Coronary artery disease: Secondary prevention stressed with the patient.  Importance of compliance with diet medication stressed and she vocalized understanding.  I reviewed last blood work and her primary care physician did blood work also. 2. Essential hypertension: Blood pressure stable 3. Paroxysmal atrial fibrillation:I discussed with the patient atrial fibrillation, disease process. Management and therapy including rate and rhythm control, anticoagulation benefits and potential risks were discussed extensively with the patient. Patient had multiple questions which were answered to patient's satisfaction. 4. S/p aortic valve replacement stable at this time 5. Patient has a hematoma at the back of her left knee.  This happened a week ago.  I evaluated it.  There is no sign of infection.  The daughter mentions to me that this is much better than when it started.  I told him to keep an eye on it.  If it gets worse they will get in touch with her primary care physician for the needful.  This was explained and the patient and daughter understood. 6. Patient will be seen in follow-up appointment in 6 months or earlier if the patient has any concerns    Medication Adjustments/Labs and Tests Ordered: Current medicines are reviewed at length with the patient today.  Concerns regarding medicines are outlined above.  No orders of the defined types were placed in this encounter.  No orders of the defined types were placed in this  encounter.    Chief Complaint  Patient presents with  . Follow-up     History of Present Illness:    Carol [name] is a 86 y.o. female.  Patient has past medical history of paroxysmal atrial fibrillation, coronary artery disease, aortic valve replacement with pericardial tissue valve.  She denies any problems at this time and takes care of activities of daily living.  She ambulates age appropriately.  Her daughter is very supportive.  At the time of my evaluation, the patient is alert awake oriented and in no distress.  Past Medical History:  Diagnosis Date  . Acute blood loss anemia 05/16/2018  . AIHA (autoimmune hemolytic anemia)   . Aortic stenosis   . Atrial fibrillation ([place])    Noted during recent cardiac surgery and treated with amiodarone   . CAD (coronary artery disease) 12/26/2011   Severe ostial LM disease noted at cath 12/26/11   . Chronic diastolic heart failure ([place]) 01/17/2012  . Coronary artery disease    Cath 1/13 left main  . 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 Dyspnea   . Elevated bilirubin 05/14/2018  . Hyperlipidemia   . Hypertension   . Hypertensive heart disease without CHF   . Hyponatremia 01/17/2012  . Hypothyroidism   . Long term (current) use of anticoagulants 07/14/2013  . Lumbar degenerative disc disease    Prior lumbar laminectomy 12/2009 Dr. Rolena Infante.  Failed epidural steroids.  Currently on gabapentin   . Lumbar disc disease   . Lung  nodules    Sees [facility], thought to be benign  . Obesity   . Obstructive jaundice 05/14/2018  . PAF (paroxysmal atrial fibrillation) ([place]) 01/30/2019  . Personal history of solitary pulmonary nodule 11/07/2011   Followed in Pulmonary clinic/ [facility]/ [facility]   - PET 11/19/11 indeterminant LUL nodule but slt reduced in size vs prev studies so rec f/u cxr in 2 months (tickle file) Wedge resection by Dr. Servando Snare at time of AVR/CABG 12/28/2011 >   wedge biopsy/resection, Left upper lobe -  NON-NECROTIZING GRANULOMATOUS INFLAMMATION WITH ASSOCIATED MULTINUCLEATED GIANT CELLS.   . S/P AVR and CABG 12/28/2011   19 mm Magna Ease pericardial tissue valve CABG x 3 (LIMA-LAD, SVG-Int, SVG-dRCA)12/28/2011 Dr. Servando Snare    . Sepsis ([place]) 08/17/2018  . Spinal stenosis   . Villous adenoma of rectum     Past Surgical History:  Procedure Laterality Date  . AORTIC VALVE REPLACEMENT  12/28/2011   Procedure: AORTIC VALVE REPLACEMENT (AVR);  Surgeon: Grace Isaac, MD;  Location: [place];  Service: Open Heart Surgery;  Laterality: N/A;  . CARDIAC CATHETERIZATION  2013  . CHOLECYSTECTOMY    . CORONARY ARTERY BYPASS GRAFT  12/28/2011   Procedure: CORONARY ARTERY BYPASS GRAFTING (CABG);  Surgeon: Grace Isaac, MD;  Location: [place];  Service: Open Heart Surgery;  Laterality: N/A;  . HERNIA REPAIR    . LEFT AND RIGHT HEART CATHETERIZATION WITH CORONARY ANGIOGRAM N/A 12/26/2011   Procedure: LEFT AND RIGHT HEART CATHETERIZATION WITH CORONARY ANGIOGRAM;  Surgeon: Jacolyn Reedy, MD;  Location: [facility] CATH LAB;  Service: Cardiovascular;  Laterality: N/A;  . LUMBAR LAMINECTOMY    . PARTIAL COLECTOMY  2008   Villous adenoma    Current Medications: Current Meds  Medication Sig  . ALPRAZolam (XANAX) 0.25 MG tablet Take 0.25 mg by mouth at bedtime.  Marland Kitchen atorvastatin (LIPITOR) 20 MG tablet TAKE 1 TABLET BY MOUTH EVERYDAY AT BEDTIME  . BESIVANCE 0.6 % SUSP Place 1 drop into the left eye See admin instructions. Place 1 drop left eye 4 times daily for 2 days after Avastin eye injections  . Calcium Carbonate-Vitamin D (CALCIUM 600 + D PO) Take 1 tablet by mouth every morning.   . docusate sodium (COLACE) 100 MG capsule Take 100 mg by mouth at bedtime.   Marland Kitchen ELIQUIS 2.5 MG TABS tablet TAKE 1 TABLET BY MOUTH TWICE A DAY  . fexofenadine (ALLEGRA) 180 MG tablet Take 180 mg by mouth daily.  Marland Kitchen gabapentin (NEURONTIN) 100 MG capsule Take 100 mg by mouth 3 (three) times daily.   Marland Kitchen levothyroxine (SYNTHROID, LEVOTHROID) 88  MCG tablet Take 88 mcg by mouth daily before breakfast.   . losartan (COZAAR) 100 MG tablet TAKE 1 TABLET BY MOUTH EVERY DAY  . metoprolol tartrate (LOPRESSOR) 25 MG tablet TAKE 1 TABLET BY MOUTH TWICE A DAY  . Multiple Vitamin (MULITIVITAMIN WITH MINERALS) TABS Take 1 tablet by mouth daily.    Marland Kitchen oxybutynin (DITROPAN) 5 MG tablet Take 5 mg by mouth every other day.  . polyethylene glycol (MIRALAX / GLYCOLAX) packet Take 17 g by mouth daily as needed for mild constipation.   . predniSONE (DELTASONE) 5 MG tablet TAKE 1 TABLET BY MOUTH EVERY DAY WITH BREAKFAST  . Probiotic Product (PROBIOTIC PO) Take 1 capsule by mouth daily.     Allergies:   Ibuprofen, Nucynta [tapentadol], Ace inhibitors, Doxycycline, Hydrocodone, Nsaids, Adhesive [tape], and Latex   Social History   Socioeconomic History  . Marital status: Widowed  Spouse name: Not on file  . Number of children: 4  . Years of education: 37  . Highest education level: 12th grade  Occupational History  . Occupation: retired Scientist, clinical (histocompatibility and immunogenetics)  Tobacco Use  . Smoking status: Former Smoker    Packs/day: 0.50    Years: 10.00    Pack years: 5.00    Types: Cigarettes    Quit date: 12/24/1978    Years since quitting: 41.4  . Smokeless tobacco: Never Used  Substance and Sexual Activity  . Alcohol use: No  . Drug use: No  . Sexual activity: Not Currently    Birth control/protection: None  Other Topics Concern  . Not on file  Social History Narrative   Lives at [employer]   Phone [phone]   [address] [phone]   Caffeine use: Drinks coffee- 2 cups per day at the most per pt   Social Determinants of Health   Financial Resource Strain:   . Difficulty of Paying Living Expenses:   Food Insecurity:   . Worried About Charity fundraiser in the Last Year:   . Arboriculturist in the Last Year:   Transportation Needs:   . Film/video editor (Medical):   Marland Kitchen Lack of Transportation (Non-Medical):   Physical Activity:   . Days of  Exercise per Week:   . Minutes of Exercise per Session:   Stress:   . Feeling of Stress :   Social Connections:   . Frequency of Communication with Friends and Family:   . Frequency of Social Gatherings with Friends and Family:   . Attends Religious Services:   . Active Member of Clubs or Organizations:   . Attends Archivist Meetings:   Marland Kitchen Marital Status:      Family History: The patient's family history includes Breast cancer in her sister; Cancer in her mother and sister; Heart attack in her father; Heart disease in her father; Heart failure in her brother; Lymphoma in her sister.  ROS:   Please see the history of present illness.    All other systems reviewed and are negative.  EKGs/Labs/Other Studies Reviewed:    The following studies were reviewed today: EKG reveals sinus rhythm and nonspecific ST-T changes and poor anterior forces.   Recent Labs: 10/23/2019: ALT 23; BUN 21; Creatinine, Ser 0.91; Potassium 4.2; Sodium 130; TSH 1.400 02/22/2020: Hemoglobin 14.1; Platelet Count 173  Recent Lipid Panel No results found for: CHOL, TRIG, HDL, CHOLHDL, VLDL, LDLCALC, LDLDIRECT  Physical Exam:    VS:  BP 108/78   Pulse 93   Ht 5\' 2"  (1.575 m)   Wt 148 lb (67.1 kg)   SpO2 92%   BMI 27.07 kg/m     Wt Readings from Last 3 Encounters:  05/25/20 148 lb (67.1 kg)  02/22/20 146 lb 6.4 oz (66.4 kg)  12/01/19 148 lb 1.6 oz (67.2 kg)     GEN: Patient is in no acute distress HEENT: Normal NECK: No JVD; No carotid bruits LYMPHATICS: No lymphadenopathy CARDIAC: Hear sounds regular, 2/6 systolic murmur at the apex. RESPIRATORY:  Clear to auscultation without rales, wheezing or rhonchi  ABDOMEN: Soft, non-tender, non-distended MUSCULOSKELETAL:  No edema; No deformity  SKIN: Warm and dry NEUROLOGIC:  Alert and oriented x 3 PSYCHIATRIC:  Normal affect   Signed, Jenean Lindau, MD  05/25/2020 2:25 PM    [place]

## 2020-05-25 NOTE — Patient Instructions (Signed)
Medication Instructions:  No medication changes. *If you need a refill on your cardiac medications before your next appointment, please call your pharmacy*   Lab Work: None ordered If you have labs (blood work) drawn today and your tests are completely normal, you will receive your results only by: Marland Kitchen MyChart Message (if you have MyChart) OR . A paper copy in the mail If you have any lab test that is abnormal or we need to change your treatment, we will call you to review the results.   Testing/Procedures: None ordered   Follow-Up: At [facility], you and your health needs are our priority.  As part of our continuing mission to provide you with exceptional heart care, we have created designated Provider Care Teams.  These Care Teams include your primary Cardiologist (physician) and Advanced Practice Providers (APPs -  Physician Assistants and Nurse Practitioners) who all work together to provide you with the care you need, when you need it.  We recommend signing up for the patient portal called "MyChart".  Sign up information is provided on this After Visit Summary.  MyChart is used to connect with patients for Virtual Visits (Telemedicine).  Patients are able to view lab/test results, encounter notes, upcoming appointments, etc.  Non-urgent messages can be sent to your provider as well.   To learn more about what you can do with MyChart, go to [email or web address].    Your next appointment:   4 month(s)  The format for your next appointment:   In Person  Provider:   Jyl Heinz, MD   Other Instructions NA

## 2020-05-26 ENCOUNTER — Other Ambulatory Visit: Payer: Self-pay | Admitting: *Deleted

## 2020-05-26 DIAGNOSIS — D591 Autoimmune hemolytic anemia, unspecified: Secondary | ICD-10-CM

## 2020-05-27 ENCOUNTER — Telehealth: Payer: Self-pay | Admitting: Oncology

## 2020-05-27 ENCOUNTER — Other Ambulatory Visit: Payer: Self-pay

## 2020-05-27 ENCOUNTER — Inpatient Hospital Stay: Payer: Medicare Other | Attending: Oncology | Admitting: Nurse Practitioner

## 2020-05-27 ENCOUNTER — Encounter: Payer: Self-pay | Admitting: Nurse Practitioner

## 2020-05-27 ENCOUNTER — Inpatient Hospital Stay: Payer: Medicare Other

## 2020-05-27 VITALS — BP 152/70 | HR 95 | Temp 97.7°F | Resp 18 | Ht 62.0 in | Wt 147.3 lb

## 2020-05-27 DIAGNOSIS — Z952 Presence of prosthetic heart valve: Secondary | ICD-10-CM | POA: Insufficient documentation

## 2020-05-27 DIAGNOSIS — R21 Rash and other nonspecific skin eruption: Secondary | ICD-10-CM | POA: Diagnosis not present

## 2020-05-27 DIAGNOSIS — Z87440 Personal history of urinary (tract) infections: Secondary | ICD-10-CM | POA: Diagnosis not present

## 2020-05-27 DIAGNOSIS — D591 Autoimmune hemolytic anemia, unspecified: Secondary | ICD-10-CM | POA: Diagnosis present

## 2020-05-27 DIAGNOSIS — E039 Hypothyroidism, unspecified: Secondary | ICD-10-CM | POA: Insufficient documentation

## 2020-05-27 DIAGNOSIS — I2510 Atherosclerotic heart disease of native coronary artery without angina pectoris: Secondary | ICD-10-CM

## 2020-05-27 DIAGNOSIS — Z7901 Long term (current) use of anticoagulants: Secondary | ICD-10-CM | POA: Diagnosis not present

## 2020-05-27 DIAGNOSIS — E785 Hyperlipidemia, unspecified: Secondary | ICD-10-CM | POA: Insufficient documentation

## 2020-05-27 LAB — CBC WITH DIFFERENTIAL (CANCER CENTER ONLY)
Abs Immature Granulocytes: 0.03 10*3/uL (ref 0.00–0.07)
Basophils Absolute: 0.1 10*3/uL (ref 0.0–0.1)
Basophils Relative: 1 %
Eosinophils Absolute: 0.1 10*3/uL (ref 0.0–0.5)
Eosinophils Relative: 1 %
HCT: 41.9 % (ref 36.0–46.0)
Hemoglobin: 13.8 g/dL (ref 12.0–15.0)
Immature Granulocytes: 0 %
Lymphocytes Relative: 13 %
Lymphs Abs: 1.3 10*3/uL (ref 0.7–4.0)
MCH: 29.8 pg (ref 26.0–34.0)
MCHC: 32.9 g/dL (ref 30.0–36.0)
MCV: 90.5 fL (ref 80.0–100.0)
Monocytes Absolute: 0.7 10*3/uL (ref 0.1–1.0)
Monocytes Relative: 7 %
Neutro Abs: 7.7 10*3/uL (ref 1.7–7.7)
Neutrophils Relative %: 78 %
Platelet Count: 171 10*3/uL (ref 150–400)
RBC: 4.63 MIL/uL (ref 3.87–5.11)
RDW: 14.2 % (ref 11.5–15.5)
WBC Count: 9.8 10*3/uL (ref 4.0–10.5)
nRBC: 0 % (ref 0.0–0.2)

## 2020-05-27 LAB — RETICULOCYTES
Immature Retic Fract: 9.9 % (ref 2.3–15.9)
RBC.: 4.57 MIL/uL (ref 3.87–5.11)
Retic Count, Absolute: 161.3 10*3/uL (ref 19.0–186.0)
Retic Ct Pct: 3.5 % — ABNORMAL HIGH (ref 0.4–3.1)

## 2020-05-27 MED ORDER — PREDNISONE 5 MG PO TABS: ORAL_TABLET | ORAL | 1 refills | Status: DC

## 2020-05-27 NOTE — Telephone Encounter (Signed)
Scheduled per 06/04 los, patient received after visit summary and calender.

## 2020-05-27 NOTE — Progress Notes (Signed)
  [place] OFFICE PROGRESS NOTE   Diagnosis: Hemolytic anemia  INTERVAL HISTORY:   Carol [name] returns as scheduled.  She continues prednisone 5 mg daily.  Overall she feels well.  No leg swelling.  Energy level remains poor.  She tries to be active.  She occasionally has a mild rash.  Objective:  Vital signs in last 24 hours:  Blood pressure (!) 152/70, pulse 95, temperature 97.7 F (36.5 C), temperature source Temporal, resp. rate 18, height 5\' 2"  (1.575 m), weight 147 lb 4.8 oz (66.8 kg), SpO2 98 %.    HEENT: No thrush. Resp: Lungs clear bilaterally. Cardio: Regular rate and rhythm. GI: Abdomen soft and nontender.  No hepatosplenomegaly. Vascular: No leg edema.  Lab Results:  Lab Results  Component Value Date   WBC 9.8 05/27/2020   HGB 13.8 05/27/2020   HCT 41.9 05/27/2020   MCV 90.5 05/27/2020   PLT 171 05/27/2020   NEUTROABS 7.7 05/27/2020    Imaging:  No results found.  Medications: I have reviewed the patient's current medications.  Assessment/Plan: 1.Autoimmune hemolytic anemia, status post 2 units of packed red blood cells 05/14/2018, prednisone 05/15/2018, Solu-Medrol started 05/16/2018;prednisone 60 mg daily beginning 05/19/2018  Prednisone taper to 40 mg daily beginning 05/23/2018  Prednisone taper to 30 mg daily beginning 05/30/2018  Prednisone taper to 20 mg daily beginning 06/13/2018  Prednisone taper to 15 mg daily beginning 06/28/2018  Prednisone taper to 10 mg daily beginning 07/23/2018  Prednisone taper to 5 mg daily beginning 09/01/2018  Prednisone dose increased 09/16/2018 due to a rash,20 mg for 2 weeks followed by 10 mg per dermatology  Prednisone decreased to 5 mg daily beginning 10/14/2018  Prednisone decreased to 2.5 mg daily approximately 11/18/2018  Prednisone decreased to 2.5 mg every other day 12/09/2018  Prednisone increased to 2.5 mg daily 12/15/2018 due to recurrent rash  Prednisone changed to 1 mg every other  day  Recurrent hemolysis 07/13/2019, prednisone 40 mg daily  Prednisone taper to 30 mg daily beginning 07/24/2019  Prednisone taper to 20 mg daily beginning 08/07/2019  Prednisone taper to 15 mg daily beginning 09/01/2019  Prednisone taper to 10 mg daily beginning 10/02/2019  Prednisone taper to 5 mg daily beginning 11/02/2019  Prednisone 5 mg daily continued  2. History of coronary artery disease  3. Status post aortic valve replacement, maintained on Coumadin  4. Hyperlipidemia  5. Hypothyroidism  6.Hospitalization 08/17/2018 through 08/20/2018 with E. coli UTI   Disposition: Carol [name] appears unchanged.  Hemoglobin remains stable.  She will continue prednisone 5 mg daily.  She will return for lab and follow-up in 3 months.    [name] ANP/GNP-BC   05/27/2020  12:16 PM

## 2020-06-01 ENCOUNTER — Other Ambulatory Visit: Payer: Self-pay

## 2020-06-01 ENCOUNTER — Encounter (INDEPENDENT_AMBULATORY_CARE_PROVIDER_SITE_OTHER): Payer: Medicare Other | Admitting: Ophthalmology

## 2020-06-01 DIAGNOSIS — I10 Essential (primary) hypertension: Secondary | ICD-10-CM | POA: Diagnosis not present

## 2020-06-01 DIAGNOSIS — H3411 Central retinal artery occlusion, right eye: Secondary | ICD-10-CM

## 2020-06-01 DIAGNOSIS — H34812 Central retinal vein occlusion, left eye, with macular edema: Secondary | ICD-10-CM

## 2020-06-01 DIAGNOSIS — H43813 Vitreous degeneration, bilateral: Secondary | ICD-10-CM

## 2020-06-01 DIAGNOSIS — H35033 Hypertensive retinopathy, bilateral: Secondary | ICD-10-CM

## 2020-06-09 IMAGING — CR DG CHEST 2V
2 series · 2 of 2 positions shown · non-contrast
Comparison: 02/21/2012

CLINICAL DATA: Chills

EXAM:
CHEST - 2 VIEW

[w chest lat]
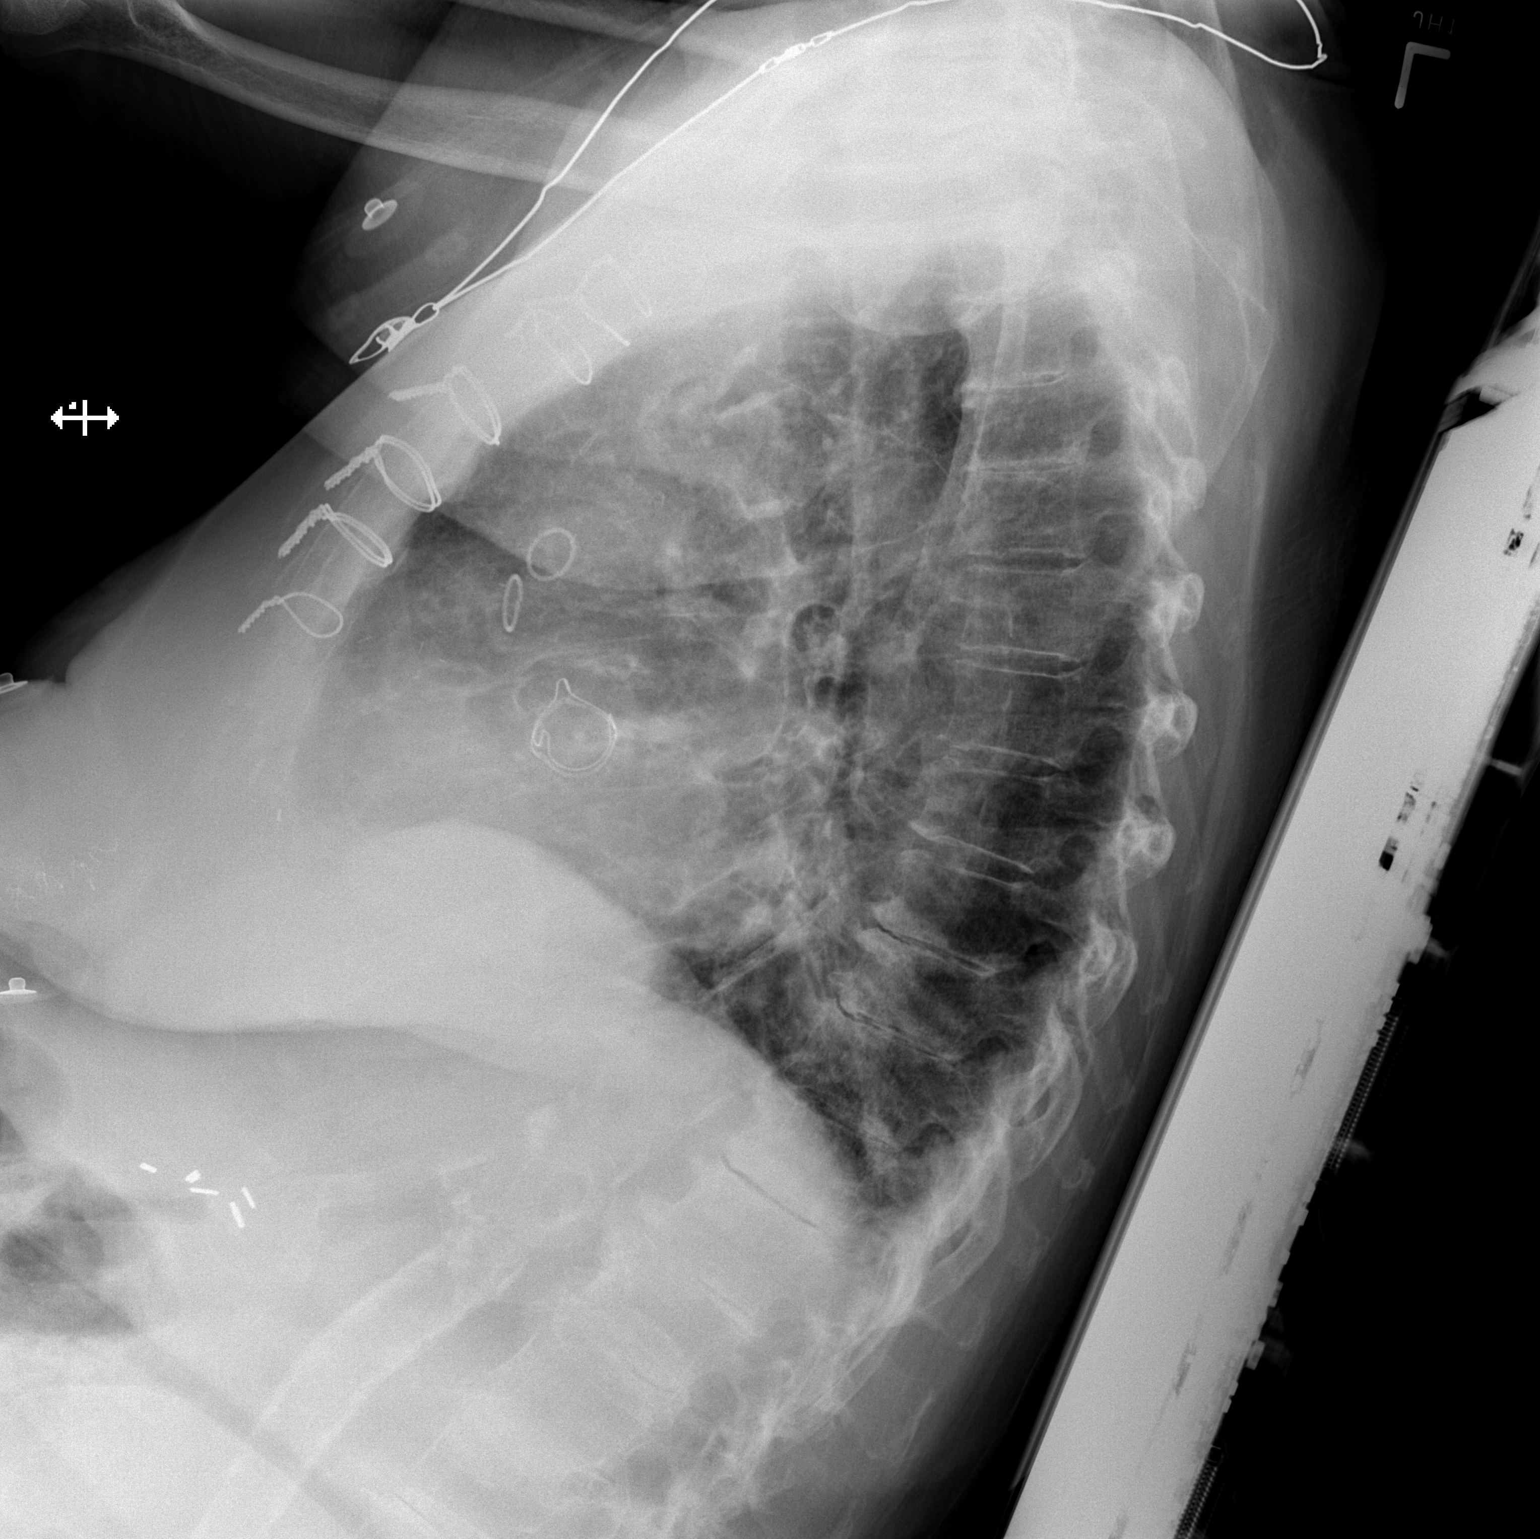

[x chest ap]
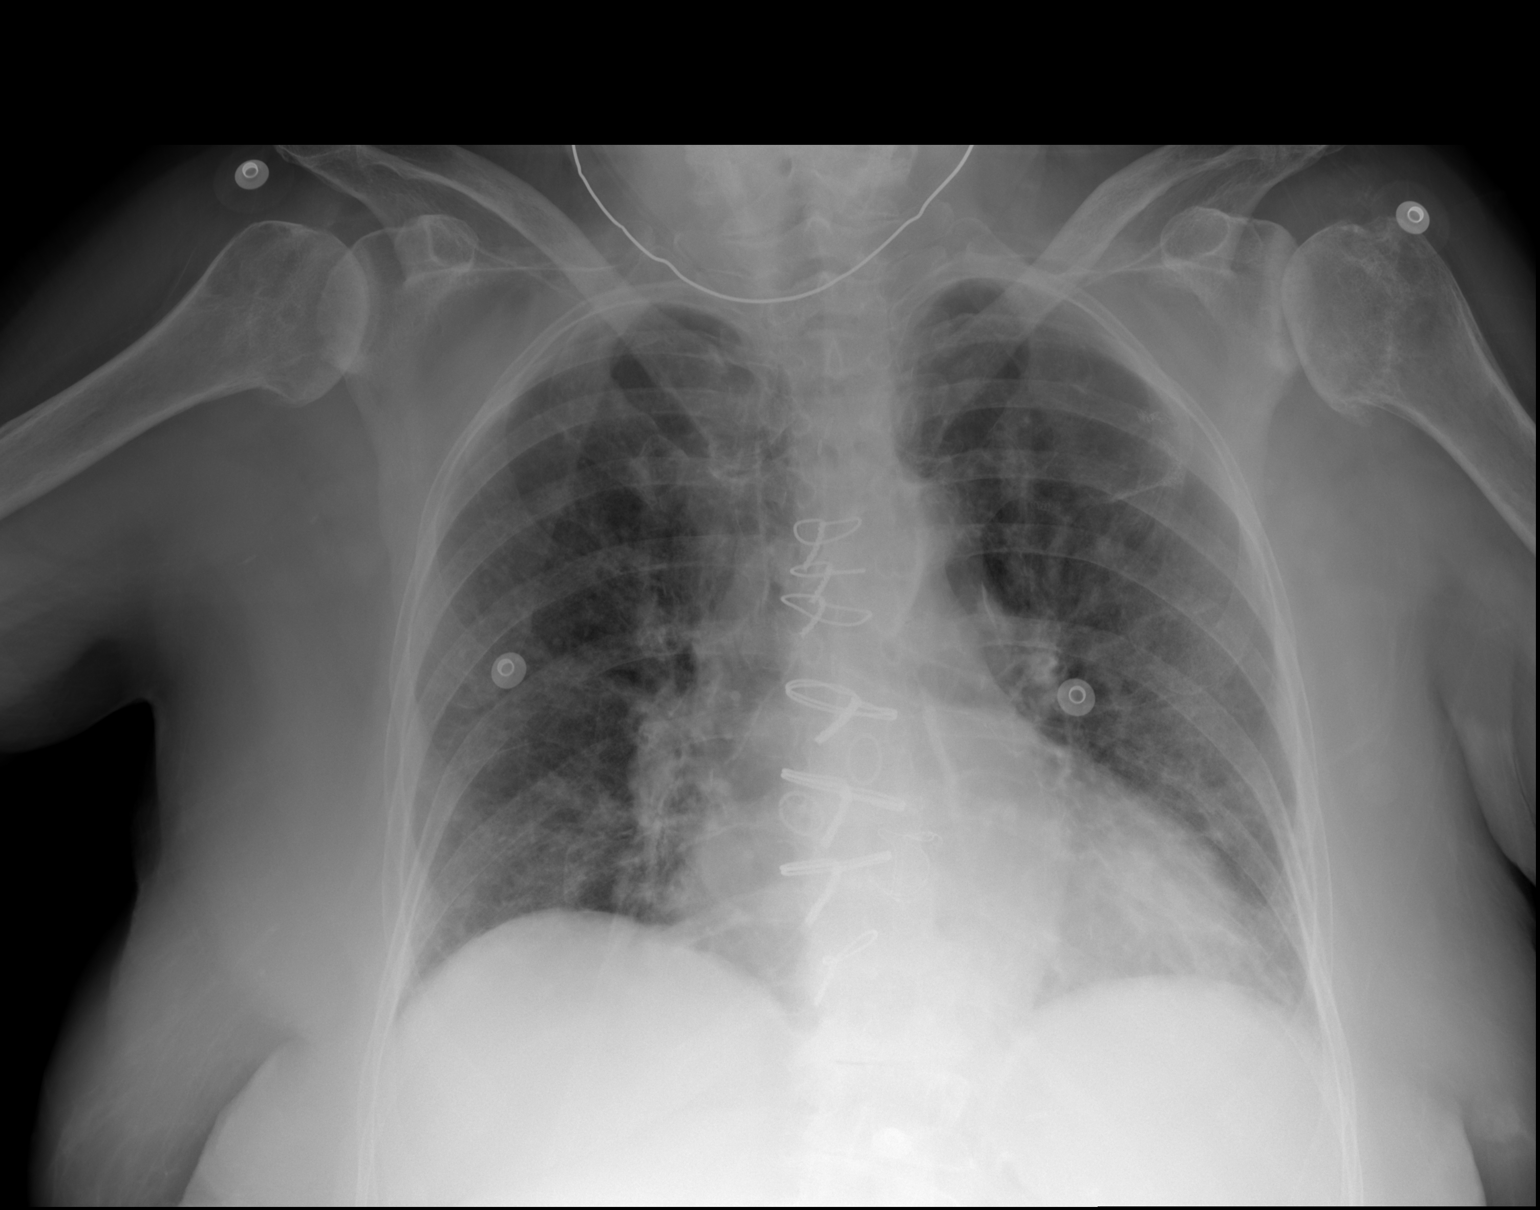

[2 of 2 positions shown; findings below may reference images not displayed]

FINDINGS: Postsurgical changes of the mediastinum. Evidence of prior valve
prosthesis. Cardiomegaly. Mild diffuse increased interstitial
opacity. Postsurgical changes in the left upper lobe. No
pneumothorax or pleural effusion. Aortic atherosclerosis.
IMPRESSION: 1. Mild diffuse increased interstitial opacity, possibly due to
bronchial inflammation. No focal pulmonary infiltrate.
2. Mild cardiomegaly

## 2020-06-09 IMAGING — DX DG CHEST 2V
2 series · 2 of 2 positions shown · non-contrast
Comparison: Chest x-ray from earlier same day. Chest x-ray dated
02/21/2012.

CLINICAL DATA: Recent diagnosis of UTI.  Found down today.

EXAM:
CHEST - 2 VIEW

[chest lat]
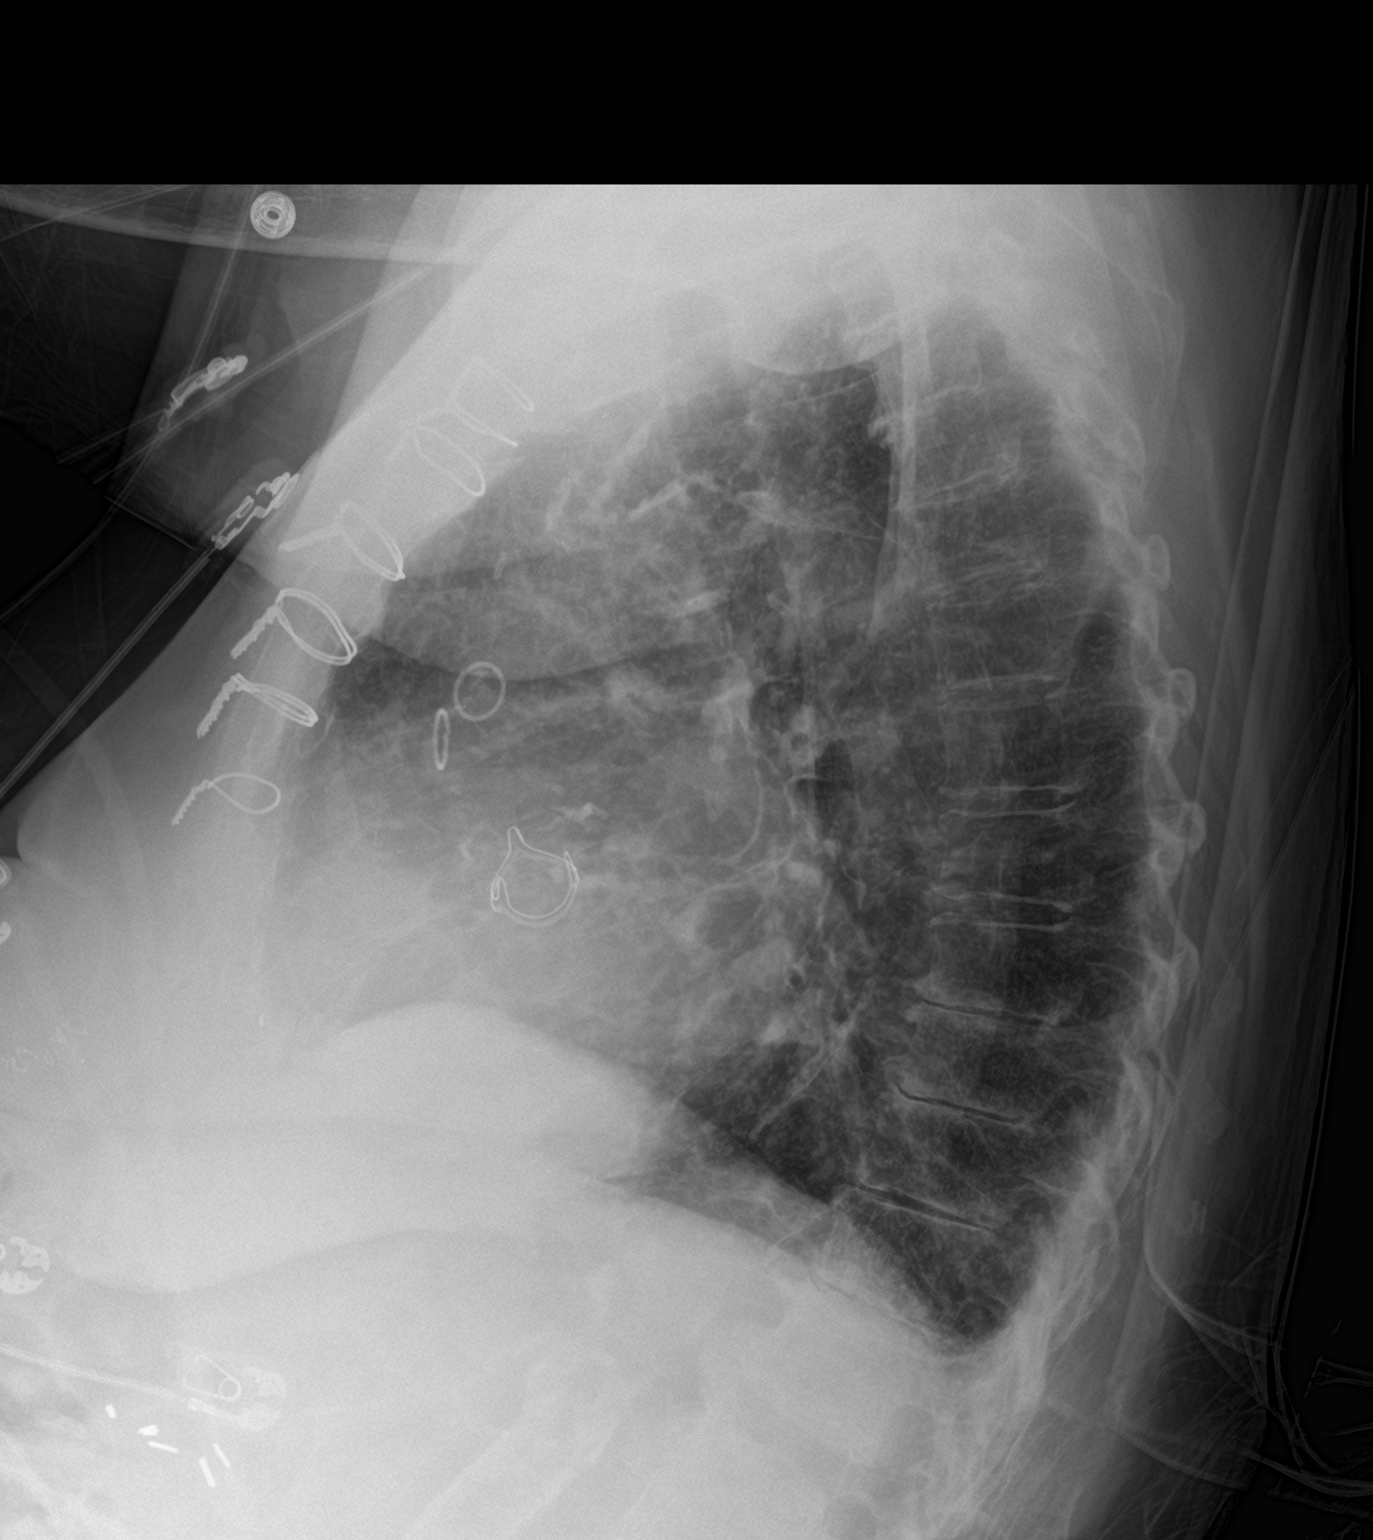

[chest ap]
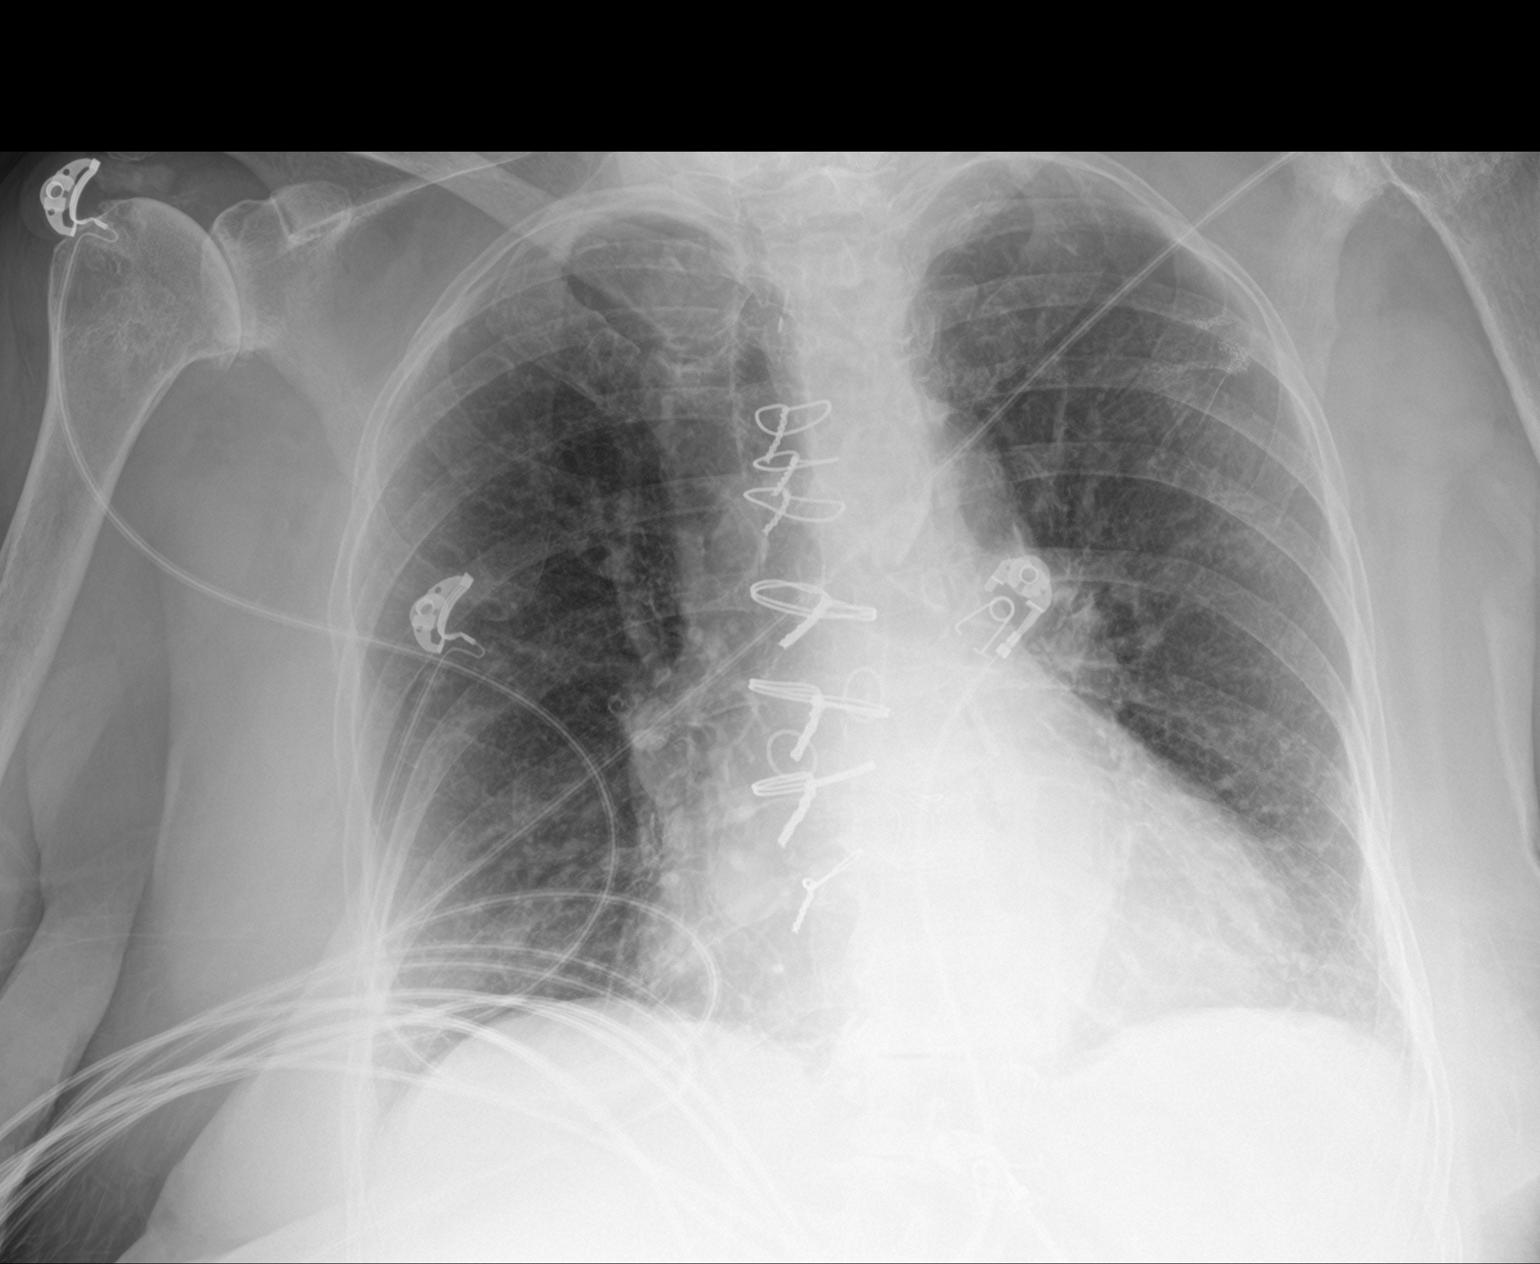

[2 of 2 positions shown; findings below may reference images not displayed]

FINDINGS: Stable cardiomegaly. Median sternotomy wires appear intact and
stable in alignment. Coarse interstitial markings again noted
bilaterally, stable in the short-term interval. No new lung
findings. No confluent opacity to suggest a developing pneumonia. No
pleural effusion or pneumothorax seen.

Degenerative changes at the bilateral shoulders, at least moderate
in degree. No acute or suspicious osseous finding.
IMPRESSION: 1. Coarse interstitial markings again noted throughout both lungs,
suspected chronic interstitial lung disease.
2. No acute findings.  No evidence of pneumonia or pulmonary edema.
3. Stable mild cardiomegaly.

## 2020-07-22 ENCOUNTER — Encounter (INDEPENDENT_AMBULATORY_CARE_PROVIDER_SITE_OTHER): Payer: Medicare Other | Admitting: Ophthalmology

## 2020-08-10 ENCOUNTER — Other Ambulatory Visit: Payer: Self-pay

## 2020-08-10 ENCOUNTER — Encounter (INDEPENDENT_AMBULATORY_CARE_PROVIDER_SITE_OTHER): Payer: Medicare Other | Admitting: Ophthalmology

## 2020-08-10 DIAGNOSIS — I10 Essential (primary) hypertension: Secondary | ICD-10-CM

## 2020-08-10 DIAGNOSIS — H34812 Central retinal vein occlusion, left eye, with macular edema: Secondary | ICD-10-CM | POA: Diagnosis not present

## 2020-08-10 DIAGNOSIS — H3411 Central retinal artery occlusion, right eye: Secondary | ICD-10-CM

## 2020-08-10 DIAGNOSIS — H35033 Hypertensive retinopathy, bilateral: Secondary | ICD-10-CM

## 2020-08-10 DIAGNOSIS — H43813 Vitreous degeneration, bilateral: Secondary | ICD-10-CM

## 2020-09-02 ENCOUNTER — Inpatient Hospital Stay (HOSPITAL_BASED_OUTPATIENT_CLINIC_OR_DEPARTMENT_OTHER): Payer: Medicare Other | Admitting: Oncology

## 2020-09-02 ENCOUNTER — Inpatient Hospital Stay: Payer: Medicare Other | Attending: Oncology

## 2020-09-02 ENCOUNTER — Telehealth: Payer: Self-pay | Admitting: Oncology

## 2020-09-02 ENCOUNTER — Other Ambulatory Visit: Payer: Self-pay

## 2020-09-02 VITALS — BP 167/61 | HR 90 | Temp 97.4°F | Resp 18 | Ht 62.0 in | Wt 147.1 lb

## 2020-09-02 DIAGNOSIS — E039 Hypothyroidism, unspecified: Secondary | ICD-10-CM | POA: Diagnosis not present

## 2020-09-02 DIAGNOSIS — M1990 Unspecified osteoarthritis, unspecified site: Secondary | ICD-10-CM | POA: Insufficient documentation

## 2020-09-02 DIAGNOSIS — Z79899 Other long term (current) drug therapy: Secondary | ICD-10-CM | POA: Insufficient documentation

## 2020-09-02 DIAGNOSIS — D591 Autoimmune hemolytic anemia, unspecified: Secondary | ICD-10-CM | POA: Diagnosis present

## 2020-09-02 DIAGNOSIS — Z952 Presence of prosthetic heart valve: Secondary | ICD-10-CM | POA: Insufficient documentation

## 2020-09-02 DIAGNOSIS — M79604 Pain in right leg: Secondary | ICD-10-CM | POA: Insufficient documentation

## 2020-09-02 DIAGNOSIS — M79605 Pain in left leg: Secondary | ICD-10-CM | POA: Insufficient documentation

## 2020-09-02 DIAGNOSIS — I2510 Atherosclerotic heart disease of native coronary artery without angina pectoris: Secondary | ICD-10-CM

## 2020-09-02 DIAGNOSIS — Z7901 Long term (current) use of anticoagulants: Secondary | ICD-10-CM | POA: Diagnosis not present

## 2020-09-02 DIAGNOSIS — E785 Hyperlipidemia, unspecified: Secondary | ICD-10-CM | POA: Insufficient documentation

## 2020-09-02 LAB — CBC WITH DIFFERENTIAL (CANCER CENTER ONLY)
Abs Immature Granulocytes: 0.03 10*3/uL (ref 0.00–0.07)
Basophils Absolute: 0.1 10*3/uL (ref 0.0–0.1)
Basophils Relative: 1 %
Eosinophils Absolute: 0.1 10*3/uL (ref 0.0–0.5)
Eosinophils Relative: 1 %
HCT: 40.3 % (ref 36.0–46.0)
Hemoglobin: 13.2 g/dL (ref 12.0–15.0)
Immature Granulocytes: 0 %
Lymphocytes Relative: 10 %
Lymphs Abs: 1.1 10*3/uL (ref 0.7–4.0)
MCH: 30.2 pg (ref 26.0–34.0)
MCHC: 32.8 g/dL (ref 30.0–36.0)
MCV: 92.2 fL (ref 80.0–100.0)
Monocytes Absolute: 0.7 10*3/uL (ref 0.1–1.0)
Monocytes Relative: 7 %
Neutro Abs: 8.9 10*3/uL — ABNORMAL HIGH (ref 1.7–7.7)
Neutrophils Relative %: 81 %
Platelet Count: 188 10*3/uL (ref 150–400)
RBC: 4.37 MIL/uL (ref 3.87–5.11)
RDW: 14.1 % (ref 11.5–15.5)
WBC Count: 10.9 10*3/uL — ABNORMAL HIGH (ref 4.0–10.5)
nRBC: 0 % (ref 0.0–0.2)

## 2020-09-02 LAB — RETICULOCYTES
Immature Retic Fract: 10.6 % (ref 2.3–15.9)
RBC.: 4.34 MIL/uL (ref 3.87–5.11)
Retic Count, Absolute: 138.4 10*3/uL (ref 19.0–186.0)
Retic Ct Pct: 3.2 % — ABNORMAL HIGH (ref 0.4–3.1)

## 2020-09-02 NOTE — Telephone Encounter (Signed)
Scheduled per los. Gave avs and calendar  

## 2020-09-02 NOTE — Progress Notes (Signed)
[place] OFFICE PROGRESS NOTE   Diagnosis: Autoimmune hemolytic anemia  INTERVAL HISTORY:   Carol [name] returns as scheduled.  She complains of bilateral leg pain.  She relates this to arthritis in the spine.  The pain is helped with Tylenol and gabapentin.  She bruises easily.  She continues prednisone at a dose of 5 mg daily.  Objective:  Vital signs in last 24 hours:  Blood pressure (!) 167/61, pulse 90, temperature (!) 97.4 F (36.3 C), temperature source Axillary, resp. rate 18, height 5\' 2"  (1.575 m), weight 147 lb 1.6 oz (66.7 kg), SpO2 98 %.    HEENT: No thrush, mild scleral icterus? Lymphatics: No cervical, supraclavicular, or axillary nodes Resp: Lungs clear bilaterally Cardio: Regular rate and rhythm GI: No hepatosplenomegaly Vascular: No leg edema  Skin: Purpura and abrasions over the lower legs   Lab Results:  Lab Results  Component Value Date   WBC 10.9 (H) 09/02/2020   HGB 13.2 09/02/2020   HCT 40.3 09/02/2020   MCV 92.2 09/02/2020   PLT 188 09/02/2020   NEUTROABS 8.9 (H) 09/02/2020    CMP  Lab Results  Component Value Date   NA 130 (L) 10/23/2019   K 4.2 10/23/2019   CL 93 (L) 10/23/2019   CO2 24 10/23/2019   GLUCOSE 140 (H) 10/23/2019   BUN 21 10/23/2019   CREATININE 0.91 10/23/2019   CALCIUM 9.9 10/23/2019   PROT 6.3 10/23/2019   ALBUMIN 4.5 10/23/2019   AST 21 10/23/2019   ALT 23 10/23/2019   ALKPHOS 52 10/23/2019   BILITOT 0.8 10/23/2019   GFRNONAA 56 (L) 10/23/2019   GFRAA 65 10/23/2019     Medications: I have reviewed the patient's current medications.   Assessment/Plan: 1. Autoimmune hemolytic anemia, status post 2 units of packed red blood cells 05/14/2018, prednisone 05/15/2018, Solu-Medrol started 05/16/2018;prednisone 60 mg daily beginning 05/19/2018  Prednisone taper to 40 mg daily beginning 05/23/2018  Prednisone taper to 30 mg daily beginning 05/30/2018  Prednisone taper to 20 mg daily beginning  06/13/2018  Prednisone taper to 15 mg daily beginning 06/28/2018  Prednisone taper to 10 mg daily beginning 07/23/2018  Prednisone taper to 5 mg daily beginning 09/01/2018  Prednisone dose increased 09/16/2018 due to a rash,20 mg for 2 weeks followed by 10 mg per dermatology  Prednisone decreased to 5 mg daily beginning 10/14/2018  Prednisone decreased to 2.5 mg daily approximately 11/18/2018  Prednisone decreased to 2.5 mg every other day 12/09/2018  Prednisone increased to 2.5 mg daily 12/15/2018 due to recurrent rash  Prednisone changed to 1 mg every other day  Recurrent hemolysis 07/13/2019, prednisone 40 mg daily  Prednisone taper to 30 mg daily beginning 07/24/2019  Prednisone taper to 20 mg daily beginning 08/07/2019  Prednisone taper to 15 mg daily beginning 09/01/2019  Prednisone taper to 10 mg daily beginning 10/02/2019  Prednisone taper to 5 mg daily beginning 11/02/2019  Prednisone 5 mg daily continued  2. History of coronary artery disease  3. Status post aortic valve replacement, maintained on Coumadin  4. Hyperlipidemia  5. Hypothyroidism  6.Hospitalization 08/17/2018 through 08/20/2018 with E. coli UTI     Disposition: Carol [name] appears stable.  The hemoglobin is stable.  She will continue prednisone at a dose of 5 mg daily.  She will call for symptoms of anemia, jaundice, or dark urine.  She will return for an office and lab visit in 3 months.  She plans to obtain a COVID-19 booster vaccine when available.  Betsy Coder, MD  09/02/2020  11:13 AM

## 2020-09-07 ENCOUNTER — Encounter (INDEPENDENT_AMBULATORY_CARE_PROVIDER_SITE_OTHER): Payer: Medicare Other | Admitting: Ophthalmology

## 2020-09-07 ENCOUNTER — Other Ambulatory Visit: Payer: Self-pay

## 2020-09-07 DIAGNOSIS — H34812 Central retinal vein occlusion, left eye, with macular edema: Secondary | ICD-10-CM

## 2020-09-07 DIAGNOSIS — H35033 Hypertensive retinopathy, bilateral: Secondary | ICD-10-CM | POA: Diagnosis not present

## 2020-09-07 DIAGNOSIS — I10 Essential (primary) hypertension: Secondary | ICD-10-CM | POA: Diagnosis not present

## 2020-09-07 DIAGNOSIS — H3411 Central retinal artery occlusion, right eye: Secondary | ICD-10-CM | POA: Diagnosis not present

## 2020-09-07 DIAGNOSIS — H43813 Vitreous degeneration, bilateral: Secondary | ICD-10-CM

## 2020-09-13 ENCOUNTER — Other Ambulatory Visit: Payer: Self-pay | Admitting: Oncology

## 2020-09-13 DIAGNOSIS — D591 Autoimmune hemolytic anemia, unspecified: Secondary | ICD-10-CM

## 2020-09-14 ENCOUNTER — Encounter (INDEPENDENT_AMBULATORY_CARE_PROVIDER_SITE_OTHER): Payer: Medicare Other | Admitting: Ophthalmology

## 2020-10-03 ENCOUNTER — Ambulatory Visit: Payer: Medicare Other | Admitting: Cardiology

## 2020-10-05 ENCOUNTER — Other Ambulatory Visit: Payer: Self-pay

## 2020-10-05 ENCOUNTER — Encounter (INDEPENDENT_AMBULATORY_CARE_PROVIDER_SITE_OTHER): Payer: Medicare Other | Admitting: Ophthalmology

## 2020-10-05 DIAGNOSIS — H35033 Hypertensive retinopathy, bilateral: Secondary | ICD-10-CM

## 2020-10-05 DIAGNOSIS — I10 Essential (primary) hypertension: Secondary | ICD-10-CM

## 2020-10-05 DIAGNOSIS — H34812 Central retinal vein occlusion, left eye, with macular edema: Secondary | ICD-10-CM

## 2020-10-05 DIAGNOSIS — H3411 Central retinal artery occlusion, right eye: Secondary | ICD-10-CM

## 2020-10-05 DIAGNOSIS — H43813 Vitreous degeneration, bilateral: Secondary | ICD-10-CM

## 2020-10-07 ENCOUNTER — Ambulatory Visit: Payer: Medicare Other | Admitting: Cardiology

## 2020-11-03 ENCOUNTER — Other Ambulatory Visit: Payer: Self-pay

## 2020-11-03 DIAGNOSIS — E669 Obesity, unspecified: Secondary | ICD-10-CM | POA: Insufficient documentation

## 2020-11-03 DIAGNOSIS — I2510 Atherosclerotic heart disease of native coronary artery without angina pectoris: Secondary | ICD-10-CM | POA: Insufficient documentation

## 2020-11-03 DIAGNOSIS — M519 Unspecified thoracic, thoracolumbar and lumbosacral intervertebral disc disorder: Secondary | ICD-10-CM | POA: Insufficient documentation

## 2020-11-03 DIAGNOSIS — M4800 Spinal stenosis, site unspecified: Secondary | ICD-10-CM | POA: Insufficient documentation

## 2020-11-03 DIAGNOSIS — R0600 Dyspnea, unspecified: Secondary | ICD-10-CM | POA: Insufficient documentation

## 2020-11-03 DIAGNOSIS — R918 Other nonspecific abnormal finding of lung field: Secondary | ICD-10-CM | POA: Insufficient documentation

## 2020-11-03 DIAGNOSIS — I10 Essential (primary) hypertension: Secondary | ICD-10-CM | POA: Insufficient documentation

## 2020-11-04 ENCOUNTER — Ambulatory Visit (INDEPENDENT_AMBULATORY_CARE_PROVIDER_SITE_OTHER): Payer: Medicare Other

## 2020-11-04 ENCOUNTER — Ambulatory Visit (INDEPENDENT_AMBULATORY_CARE_PROVIDER_SITE_OTHER): Payer: Medicare Other | Admitting: Cardiology

## 2020-11-04 ENCOUNTER — Other Ambulatory Visit: Payer: Self-pay

## 2020-11-04 ENCOUNTER — Encounter: Payer: Self-pay | Admitting: Cardiology

## 2020-11-04 VITALS — BP 144/63 | HR 100 | Ht 61.0 in | Wt 146.0 lb

## 2020-11-04 DIAGNOSIS — E782 Mixed hyperlipidemia: Secondary | ICD-10-CM

## 2020-11-04 DIAGNOSIS — I2510 Atherosclerotic heart disease of native coronary artery without angina pectoris: Secondary | ICD-10-CM

## 2020-11-04 DIAGNOSIS — I4891 Unspecified atrial fibrillation: Secondary | ICD-10-CM

## 2020-11-04 DIAGNOSIS — Z952 Presence of prosthetic heart valve: Secondary | ICD-10-CM

## 2020-11-04 DIAGNOSIS — I495 Sick sinus syndrome: Secondary | ICD-10-CM

## 2020-11-04 DIAGNOSIS — I482 Chronic atrial fibrillation, unspecified: Secondary | ICD-10-CM

## 2020-11-04 NOTE — Patient Instructions (Signed)
Medication Instructions:  No medication changes. *If you need a refill on your cardiac medications before your next appointment, please call your pharmacy*   Lab Work: Your physician recommends that you have labs done in the office today. Your test included  basic metabolic panel, complete blood count and TSH.  If you have labs (blood work) drawn today and your tests are completely normal, you will receive your results only by: Marland Kitchen MyChart Message (if you have MyChart) OR . A paper copy in the mail If you have any lab test that is abnormal or we need to change your treatment, we will call you to review the results.   Testing/Procedures:  WHY IS MY DOCTOR PRESCRIBING ZIO? The Zio system is proven and trusted by physicians to detect and diagnose irregular heart rhythms - and has been prescribed to hundreds of thousands of patients.  The FDA has cleared the Zio system to monitor for many different kinds of irregular heart rhythms. In a study, physicians were able to reach a diagnosis 90% of the time with the Zio system1.  You can wear the Zio monitor - a small, discreet, comfortable patch - during your normal day-to-day activity, including while you sleep, shower, and exercise, while it records every single heartbeat for analysis.  1Barrett, P., et al. Comparison of 24 Hour Holter Monitoring Versus 14 Day Novel Adhesive Patch Electrocardiographic Monitoring. [place], 2014.  ZIO VS. HOLTER MONITORING The Zio monitor can be comfortably worn for up to 14 days. Holter monitors can be worn for 24 to 48 hours, limiting the time to record any irregular heart rhythms you may have. Zio is able to capture data for the 51% of patients who have their first symptom-triggered arrhythmia after 48 hours.1  LIVE WITHOUT RESTRICTIONS The Zio ambulatory cardiac monitor is a small, unobtrusive, and water-resistant patch-you might even forget you're wearing it. The Zio monitor records and  stores every beat of your heart, whether you're sleeping, working out, or showering. Wear for 3 days remove 11/07/20.   Follow-Up: At [facility], you and your health needs are our priority.  As part of our continuing mission to provide you with exceptional heart care, we have created designated Provider Care Teams.  These Care Teams include your primary Cardiologist (physician) and Advanced Practice Providers (APPs -  Physician Assistants and Nurse Practitioners) who all work together to provide you with the care you need, when you need it.  We recommend signing up for the patient portal called "MyChart".  Sign up information is provided on this After Visit Summary.  MyChart is used to connect with patients for Virtual Visits (Telemedicine).  Patients are able to view lab/test results, encounter notes, upcoming appointments, etc.  Non-urgent messages can be sent to your provider as well.   To learn more about what you can do with MyChart, go to [email or web address].    Your next appointment:   1 month(s)  The format for your next appointment:   In Person  Provider:   Jyl Heinz, MD   Other Instructions NA

## 2020-11-04 NOTE — Progress Notes (Signed)
Cardiology Office Note:    Date:  11/04/2020   ID:  Carol [name], DOB January 14, 1928, MRN [id number]  PCP:  Lujean Amel, MD  Cardiologist:  Jenean Lindau, MD   Referring MD: Lujean Amel, MD    ASSESSMENT:    1. Atrial fibrillation, unspecified type ([place])   2. Coronary artery disease involving native coronary artery of native heart without angina pectoris   3. Mixed hyperlipidemia   4. S/P AVR and CABG    PLAN:    In order of problems listed above:  1. Coronary artery disease: Secondary prevention stressed with the patient.  Importance of compliance with diet medication stressed and she vocalized understanding. Post aortic valve replacement: Stable at this time clinically.  We will get echocardiogram in the future. Essential hypertension: Blood pressure stable Mixed dyslipidemia: On statin therapy and labs managed by primary care physician. Atrial fibrillation: I wonder whether any of this fatigue is coming from elevated heart rate.  I will obtain Chem-7 and TSH today.  We will also do a CBC.  Also will do a 3-day monitor to assess heart rate and optimize it if necessary. Follow-up appointment in a month or earlier if she has any concerns.  She and her daughter had multiple questions which were answered to their satisfaction.  Medication Adjustments/Labs and Tests Ordered: Current medicines are reviewed at length with the patient today.  Concerns regarding medicines are outlined above.  No orders of the defined types were placed in this encounter.  No orders of the defined types were placed in this encounter.    No chief complaint on file.    History of Present Illness:    Carol [name] is a 86 y.o. female.  Patient has past medical history of atrial fibrillation, essential hypertension.  She is post aortic valve replacement.  She denies any problems at this time and takes care of activities of daily living.  No chest pain orthopnea or PND.  Her only concern  is that she feels fatigued easily and this is been happening over the past several months.  At the time of my evaluation, the patient is alert awake oriented and in no distress.  Past Medical History:  Diagnosis Date  . Acute blood loss anemia 05/16/2018  . AIHA (autoimmune hemolytic anemia) (HCC)   . Aortic stenosis   . Atrial fibrillation ([place])    Noted during recent cardiac surgery and treated with amiodarone   . CAD (coronary artery disease) 12/26/2011   Severe ostial LM disease noted at cath 12/26/11   . Chronic diastolic heart failure ([place]) 01/17/2012  . Coronary artery disease    Cath 1/13 left main  . 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 Dyspnea   . Elevated bilirubin 05/14/2018  . Hyperlipidemia   . Hypertension   . Hypertensive heart disease without CHF   . Hyponatremia 01/17/2012  . Hypothyroidism   . Long term (current) use of anticoagulants 07/14/2013  . Lumbar degenerative disc disease    Prior lumbar laminectomy 12/2009 Dr. Rolena Infante.  Failed epidural steroids.  Currently on gabapentin   . Lumbar disc disease   . Lung nodules    Sees [facility], thought to be benign  . Obesity   . Obstructive jaundice 05/14/2018  . PAF (paroxysmal atrial fibrillation) ([place]) 01/30/2019  . Personal history of solitary pulmonary nodule 11/07/2011   Followed in Pulmonary clinic/ [facility]/ [facility]   - PET 11/19/11 indeterminant LUL nodule but slt reduced in size  vs prev studies so rec f/u cxr in 2 months (tickle file) Wedge resection by Dr. Servando Snare at time of AVR/CABG 12/28/2011 >   wedge biopsy/resection, Left upper lobe - NON-NECROTIZING GRANULOMATOUS INFLAMMATION WITH ASSOCIATED MULTINUCLEATED GIANT CELLS.   . S/P AVR and CABG 12/28/2011   19 mm Magna Ease pericardial tissue valve CABG x 3 (LIMA-LAD, SVG-Int, SVG-dRCA)12/28/2011 Dr. Servando Snare    . Sepsis ([place]) 08/17/2018  . Spinal stenosis   . Villous adenoma of rectum     Past Surgical History:  Procedure Laterality Date  .  AORTIC VALVE REPLACEMENT  12/28/2011   Procedure: AORTIC VALVE REPLACEMENT (AVR);  Surgeon: Grace Isaac, MD;  Location: [facility];  Service: Open Heart Surgery;  Laterality: N/A;  . CARDIAC CATHETERIZATION  2013  . CHOLECYSTECTOMY    . CORONARY ARTERY BYPASS GRAFT  12/28/2011   Procedure: CORONARY ARTERY BYPASS GRAFTING (CABG);  Surgeon: Grace Isaac, MD;  Location: [place];  Service: Open Heart Surgery;  Laterality: N/A;  . HERNIA REPAIR    . LEFT AND RIGHT HEART CATHETERIZATION WITH CORONARY ANGIOGRAM N/A 12/26/2011   Procedure: LEFT AND RIGHT HEART CATHETERIZATION WITH CORONARY ANGIOGRAM;  Surgeon: Jacolyn Reedy, MD;  Location: [facility] CATH LAB;  Service: Cardiovascular;  Laterality: N/A;  . LUMBAR LAMINECTOMY    . PARTIAL COLECTOMY  2008   Villous adenoma    Current Medications: Current Meds  Medication Sig  . ALPRAZolam (XANAX) 0.25 MG tablet Take 0.25 mg by mouth at bedtime.  Marland Kitchen atorvastatin (LIPITOR) 20 MG tablet TAKE 1 TABLET BY MOUTH EVERYDAY AT BEDTIME  . BESIVANCE 0.6 % SUSP Place 1 drop into the left eye See admin instructions. Place 1 drop left eye 4 times daily for 2 days after Avastin eye injections  . Calcium Carbonate-Vitamin D (CALCIUM 600 + D PO) Take 1 tablet by mouth every morning.   . docusate sodium (COLACE) 100 MG capsule Take 100 mg by mouth at bedtime.   Marland Kitchen ELIQUIS 2.5 MG TABS tablet TAKE 1 TABLET BY MOUTH TWICE A DAY  . fexofenadine (ALLEGRA) 180 MG tablet Take 180 mg by mouth every other day.   . gabapentin (NEURONTIN) 100 MG capsule Take 100 mg by mouth 3 (three) times daily.   Marland Kitchen levothyroxine (SYNTHROID, LEVOTHROID) 88 MCG tablet Take 88 mcg by mouth daily before breakfast.   . losartan (COZAAR) 100 MG tablet TAKE 1 TABLET BY MOUTH EVERY DAY  . metoprolol tartrate (LOPRESSOR) 25 MG tablet TAKE 1 TABLET BY MOUTH TWICE A DAY  . Multiple Vitamin (MULITIVITAMIN WITH MINERALS) TABS Take 1 tablet by mouth daily.    . mupirocin ointment (BACTROBAN) 2 % Apply 1  application topically 2 (two) times daily.  Marland Kitchen oxybutynin (DITROPAN) 5 MG tablet Take 5 mg by mouth every other day.  . polyethylene glycol (MIRALAX / GLYCOLAX) packet Take 17 g by mouth daily as needed for mild constipation.   . predniSONE (DELTASONE) 5 MG tablet TAKE 1 TABLET BY MOUTH EVERY DAY WITH BREAKFAST  . Probiotic Product (PROBIOTIC PO) Take 1 capsule by mouth daily.     Allergies:   Ibuprofen, Nucynta [tapentadol], Ace inhibitors, Doxycycline, Hydrocodone, Nsaids, Adhesive [tape], and Latex   Social History   Socioeconomic History  . Marital status: Widowed    Spouse name: Not on file  . Number of children: 4  . Years of education: 2  . Highest education level: 12th grade  Occupational History  . Occupation: retired Scientist, clinical (histocompatibility and immunogenetics)  Tobacco Use  . Smoking  status: Former Smoker    Packs/day: 0.50    Years: 10.00    Pack years: 5.00    Types: Cigarettes    Quit date: 12/24/1978    Years since quitting: 41.8  . Smokeless tobacco: Never Used  Vaping Use  . Vaping Use: Never used  Substance and Sexual Activity  . Alcohol use: No  . Drug use: No  . Sexual activity: Not Currently    Birth control/protection: None  Other Topics Concern  . Not on file  Social History Narrative   Lives at [facility]   Phone [phone]   [phone]   Caffeine use: Drinks coffee- 2 cups per day at the most per pt   Social Determinants of Health   Financial Resource Strain:   . Difficulty of Paying Living Expenses: Not on file  Food Insecurity:   . Worried About Charity fundraiser in the Last Year: Not on file  . Ran Out of Food in the Last Year: Not on file  Transportation Needs:   . Lack of Transportation (Medical): Not on file  . Lack of Transportation (Non-Medical): Not on file  Physical Activity:   . Days of Exercise per Week: Not on file  . Minutes of Exercise per Session: Not on file  Stress:   . Feeling of Stress : Not on file  Social Connections:   . Frequency  of Communication with Friends and Family: Not on file  . Frequency of Social Gatherings with Friends and Family: Not on file  . Attends Religious Services: Not on file  . Active Member of Clubs or Organizations: Not on file  . Attends Archivist Meetings: Not on file  . Marital Status: Not on file     Family History: The patient's family history includes Breast cancer in her sister; Cancer in her mother and sister; Heart attack in her father; Heart disease in her father; Heart failure in her brother; Lymphoma in her sister.  ROS:   Please see the history of present illness.    All other systems reviewed and are negative.  EKGs/Labs/Other Studies Reviewed:    The following studies were reviewed today: I discussed my findings with the patient at length   Recent Labs: 09/02/2020: Hemoglobin 13.2; Platelet Count 188  Recent Lipid Panel No results found for: CHOL, TRIG, HDL, CHOLHDL, VLDL, LDLCALC, LDLDIRECT  Physical Exam:    VS:  BP (!) 144/63   Pulse 100   Ht 5\' 1"  (1.549 m)   Wt 146 lb (66.2 kg)   SpO2 95%   BMI 27.59 kg/m     Wt Readings from Last 3 Encounters:  11/04/20 146 lb (66.2 kg)  09/02/20 147 lb 1.6 oz (66.7 kg)  05/27/20 147 lb 4.8 oz (66.8 kg)     GEN: Patient is in no acute distress HEENT: Normal NECK: No JVD; No carotid bruits LYMPHATICS: No lymphadenopathy CARDIAC: Hear sounds regular, 2/6 systolic murmur at the apex. RESPIRATORY:  Clear to auscultation without rales, wheezing or rhonchi  ABDOMEN: Soft, non-tender, non-distended MUSCULOSKELETAL:  No edema; No deformity  SKIN: Warm and dry NEUROLOGIC:  Alert and oriented x 3 PSYCHIATRIC:  Normal affect   Signed, Jenean Lindau, MD  11/04/2020 2:22 PM    [facility]

## 2020-11-05 LAB — BASIC METABOLIC PANEL
BUN/Creatinine Ratio: 28 (ref 12–28)
BUN: 27 mg/dL (ref 10–36)
CO2: 26 mmol/L (ref 20–29)
Calcium: 9.8 mg/dL (ref 8.7–10.3)
Chloride: 93 mmol/L — ABNORMAL LOW (ref 96–106)
Creatinine, Ser: 0.98 mg/dL (ref 0.57–1.00)
GFR calc Af Amer: 59 mL/min/{1.73_m2} — ABNORMAL LOW (ref >59)
GFR calc non Af Amer: 51 mL/min/{1.73_m2} — ABNORMAL LOW (ref >59)
Glucose: 127 mg/dL — ABNORMAL HIGH (ref 65–99)
Potassium: 4.7 mmol/L (ref 3.5–5.2)
Sodium: 132 mmol/L — ABNORMAL LOW (ref 134–144)

## 2020-11-05 LAB — CBC WITH DIFFERENTIAL/PLATELET
Basophils Absolute: 0 10*3/uL (ref 0.0–0.2)
Basos: 0 %
EOS (ABSOLUTE): 0.1 10*3/uL (ref 0.0–0.4)
Eos: 1 %
Hematocrit: 35.3 % (ref 34.0–46.6)
Hemoglobin: 12 g/dL (ref 11.1–15.9)
Immature Grans (Abs): 0 10*3/uL (ref 0.0–0.1)
Immature Granulocytes: 0 %
Lymphocytes Absolute: 1.1 10*3/uL (ref 0.7–3.1)
Lymphs: 12 %
MCH: 30.2 pg (ref 26.6–33.0)
MCHC: 34 g/dL (ref 31.5–35.7)
MCV: 89 fL (ref 79–97)
Monocytes Absolute: 0.6 10*3/uL (ref 0.1–0.9)
Monocytes: 7 %
Neutrophils Absolute: 7.3 10*3/uL — ABNORMAL HIGH (ref 1.4–7.0)
Neutrophils: 80 %
Platelets: 209 10*3/uL (ref 150–450)
RBC: 3.98 x10E6/uL (ref 3.77–5.28)
RDW: 13.1 % (ref 11.7–15.4)
WBC: 9.1 10*3/uL (ref 3.4–10.8)

## 2020-11-05 LAB — TSH: TSH: 2.44 u[IU]/mL (ref 0.450–4.500)

## 2020-11-09 ENCOUNTER — Encounter (INDEPENDENT_AMBULATORY_CARE_PROVIDER_SITE_OTHER): Payer: Medicare Other | Admitting: Ophthalmology

## 2020-11-10 ENCOUNTER — Other Ambulatory Visit: Payer: Self-pay | Admitting: Cardiology

## 2020-11-11 ENCOUNTER — Other Ambulatory Visit: Payer: Self-pay | Admitting: Cardiology

## 2020-11-14 ENCOUNTER — Encounter (INDEPENDENT_AMBULATORY_CARE_PROVIDER_SITE_OTHER): Payer: Medicare Other | Admitting: Ophthalmology

## 2020-11-14 ENCOUNTER — Other Ambulatory Visit: Payer: Self-pay

## 2020-11-14 DIAGNOSIS — H3411 Central retinal artery occlusion, right eye: Secondary | ICD-10-CM

## 2020-11-14 DIAGNOSIS — H43813 Vitreous degeneration, bilateral: Secondary | ICD-10-CM

## 2020-11-14 DIAGNOSIS — H35033 Hypertensive retinopathy, bilateral: Secondary | ICD-10-CM

## 2020-11-14 DIAGNOSIS — H34812 Central retinal vein occlusion, left eye, with macular edema: Secondary | ICD-10-CM

## 2020-11-14 DIAGNOSIS — I10 Essential (primary) hypertension: Secondary | ICD-10-CM | POA: Diagnosis not present

## 2020-11-19 ENCOUNTER — Telehealth: Payer: Self-pay | Admitting: Physician Assistant

## 2020-11-19 NOTE — Telephone Encounter (Signed)
Returned all to [employer] who reports asymptomatic high grade AV block during wear time. HR 27 bpm. This occurred day 2 - 11/05/20 at 12:10 AM. Reported to be only one occurrence.    I called the patient and spoke with her daughter [name], who denies reports of dizziness and syncope. She has an appt with Dr. Geraldo Pitter on 11/23/20. I will send a message to him to review the zio report - I let them know if he wants to see her sooner that he will reach out to her. She expressed understanding of the plan.

## 2020-11-21 NOTE — Addendum Note (Signed)
Addended by: Truddie Hidden on: 11/21/2020 01:57 PM   Modules accepted: Orders

## 2020-11-23 ENCOUNTER — Ambulatory Visit (INDEPENDENT_AMBULATORY_CARE_PROVIDER_SITE_OTHER): Payer: Medicare Other | Admitting: Cardiology

## 2020-11-23 ENCOUNTER — Encounter: Payer: Self-pay | Admitting: Cardiology

## 2020-11-23 ENCOUNTER — Other Ambulatory Visit: Payer: Self-pay

## 2020-11-23 VITALS — BP 136/72 | HR 74 | Ht 62.0 in | Wt 145.1 lb

## 2020-11-23 DIAGNOSIS — I4430 Unspecified atrioventricular block: Secondary | ICD-10-CM | POA: Insufficient documentation

## 2020-11-23 DIAGNOSIS — I480 Paroxysmal atrial fibrillation: Secondary | ICD-10-CM

## 2020-11-23 DIAGNOSIS — I350 Nonrheumatic aortic (valve) stenosis: Secondary | ICD-10-CM | POA: Diagnosis not present

## 2020-11-23 DIAGNOSIS — I119 Hypertensive heart disease without heart failure: Secondary | ICD-10-CM

## 2020-11-23 DIAGNOSIS — I2510 Atherosclerotic heart disease of native coronary artery without angina pectoris: Secondary | ICD-10-CM

## 2020-11-23 DIAGNOSIS — Z952 Presence of prosthetic heart valve: Secondary | ICD-10-CM

## 2020-11-23 HISTORY — DX: Unspecified atrioventricular block: I44.30

## 2020-11-23 MED ORDER — METOPROLOL TARTRATE 25 MG PO TABS
25.0000 mg | ORAL_TABLET | Freq: Every morning | ORAL | 3 refills | Status: DC
Start: 2020-11-23 — End: 2021-02-06

## 2020-11-23 NOTE — Patient Instructions (Signed)
Medication Instructions:  Your physician has recommended you make the following change in your medication:   Only take your Metoprolol in the morning.  *If you need a refill on your cardiac medications before your next appointment, please call your pharmacy*   Lab Work: None ordered If you have labs (blood work) drawn today and your tests are completely normal, you will receive your results only by: Marland Kitchen MyChart Message (if you have MyChart) OR . A paper copy in the mail If you have any lab test that is abnormal or we need to change your treatment, we will call you to review the results.   Testing/Procedures: None ordered   Follow-Up: At [facility], you and your health needs are our priority.  As part of our continuing mission to provide you with exceptional heart care, we have created designated Provider Care Teams.  These Care Teams include your primary Cardiologist (physician) and Advanced Practice Providers (APPs -  Physician Assistants and Nurse Practitioners) who all work together to provide you with the care you need, when you need it.  We recommend signing up for the patient portal called "MyChart".  Sign up information is provided on this After Visit Summary.  MyChart is used to connect with patients for Virtual Visits (Telemedicine).  Patients are able to view lab/test results, encounter notes, upcoming appointments, etc.  Non-urgent messages can be sent to your provider as well.   To learn more about what you can do with MyChart, go to [email or web address].    Your next appointment:   1 month(s)  The format for your next appointment:   In Person  Provider:   Jyl Heinz, MD   Other Instructions NA

## 2020-11-23 NOTE — Progress Notes (Signed)
Cardiology Office Note:    Date:  11/23/2020   ID:  Carol [name], DOB 1928-10-01, MRN [id number]  PCP:  Lujean Amel, MD  Cardiologist:  Jenean Lindau, MD   Referring MD: Lujean Amel, MD    ASSESSMENT:    1. PAF (paroxysmal atrial fibrillation) ([place])   2. Coronary artery disease involving native coronary artery of native heart without angina pectoris   3. Hypertensive heart disease without CHF   4. Aortic valve stenosis, etiology of cardiac valve disease unspecified   5. S/P AVR and CABG   6. AV heart block    PLAN:    In order of problems listed above:  1. High-grade AV block: Paroxysmal atrial fibrillation: Nonsustained ventricular tachycardia: I discussed my findings with the patient in extensive length.  She is asymptomatic.  Her daughter accompanies her for this visit.  I will reduce metoprolol to 25 mg only in the morning.  She has an appointment with our electrophysiology colleagues coming up.  She is asymptomatic.  I think she will benefit from permanent pacing because she is paroxysms of atrial fibrillation and nonsustained ventricular tachycardia and she is tolerating beta-blocker well.  She might need other therapies in the future for suppression of atrial fibrillation.  But I will leave this to the discretion of her electrophysiology colleagues for final evaluation. 2. Essential hypertension: Blood pressure stable and diet was emphasized. 3. Coronary artery disease: Secondary prevention stressed.  She is ambulating age appropriately.  She will be seen in follow-up appointment after the electrophysiology evaluation and possible pacemaker.  I discussed this with her at extensive length and questions were answered to her satisfaction.   Medication Adjustments/Labs and Tests Ordered: Current medicines are reviewed at length with the patient today.  Concerns regarding medicines are outlined above.  No orders of the defined types were placed in this encounter.  No  orders of the defined types were placed in this encounter.    No chief complaint on file.    History of Present Illness:    Carol [name] is a 86 y.o. female.  Patient has past medical history of paroxysmal atrial fibrillation, nonsustained ventricular tachycardia and essential hypertension.  She is on anticoagulation.  She denies any problems at this time and takes care of activities of daily living.  She underwent monitoring which revealed high-grade AV block.  She is on beta-blocker metoprolol 25 mg twice daily.  Her AV conduction defects are predominantly in the night.  At the time of my evaluation, the patient is alert awake oriented and in no distress.  Past Medical History:  Diagnosis Date  . Acute blood loss anemia 05/16/2018  . AIHA (autoimmune hemolytic anemia) (HCC)   . Aortic stenosis   . Atrial fibrillation ([place])    Noted during recent cardiac surgery and treated with amiodarone   . CAD (coronary artery disease) 12/26/2011   Severe ostial LM disease noted at cath 12/26/11   . Chronic diastolic heart failure ([place]) 01/17/2012  . Coronary artery disease    Cath 1/13 left main  . 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 Dyspnea   . Elevated bilirubin 05/14/2018  . Hyperlipidemia   . Hypertension   . Hypertensive heart disease without CHF   . Hyponatremia 01/17/2012  . Hypothyroidism   . Long term (current) use of anticoagulants 07/14/2013  . Lumbar degenerative disc disease    Prior lumbar laminectomy 12/2009 Dr. Rolena Infante.  Failed epidural steroids.  Currently on gabapentin   .  Lumbar disc disease   . Lung nodules    Sees [facility], thought to be benign  . Obesity   . Obstructive jaundice 05/14/2018  . PAF (paroxysmal atrial fibrillation) ([place]) 01/30/2019  . Personal history of solitary pulmonary nodule 11/07/2011   Followed in Pulmonary clinic/ [facility]/ [facility]   - PET 11/19/11 indeterminant LUL nodule but slt reduced in size vs prev studies so rec f/u  cxr in 2 months (tickle file) Wedge resection by Dr. Servando Snare at time of AVR/CABG 12/28/2011 >   wedge biopsy/resection, Left upper lobe - NON-NECROTIZING GRANULOMATOUS INFLAMMATION WITH ASSOCIATED MULTINUCLEATED GIANT CELLS.   . S/P AVR and CABG 12/28/2011   19 mm Magna Ease pericardial tissue valve CABG x 3 (LIMA-LAD, SVG-Int, SVG-dRCA)12/28/2011 Dr. Servando Snare    . Sepsis ([place]) 08/17/2018  . Spinal stenosis   . Villous adenoma of rectum     Past Surgical History:  Procedure Laterality Date  . AORTIC VALVE REPLACEMENT  12/28/2011   Procedure: AORTIC VALVE REPLACEMENT (AVR);  Surgeon: Grace Isaac, MD;  Location: [place];  Service: Open Heart Surgery;  Laterality: N/A;  . CARDIAC CATHETERIZATION  2013  . CHOLECYSTECTOMY    . CORONARY ARTERY BYPASS GRAFT  12/28/2011   Procedure: CORONARY ARTERY BYPASS GRAFTING (CABG);  Surgeon: Grace Isaac, MD;  Location: [place];  Service: Open Heart Surgery;  Laterality: N/A;  . HERNIA REPAIR    . LEFT AND RIGHT HEART CATHETERIZATION WITH CORONARY ANGIOGRAM N/A 12/26/2011   Procedure: LEFT AND RIGHT HEART CATHETERIZATION WITH CORONARY ANGIOGRAM;  Surgeon: Jacolyn Reedy, MD;  Location[facility];  Service: Cardiovascular;  Laterality: N/A;  . LUMBAR LAMINECTOMY    . PARTIAL COLECTOMY  2008   Villous adenoma    Current Medications: Current Meds  Medication Sig  . ALPRAZolam (XANAX) 0.25 MG tablet Take 0.25 mg by mouth at bedtime.  Marland Kitchen atorvastatin (LIPITOR) 20 MG tablet TAKE 1 TABLET BY MOUTH EVERYDAY AT BEDTIME  . BESIVANCE 0.6 % SUSP Place 1 drop into the left eye See admin instructions. Place 1 drop left eye 4 times daily for 2 days after Avastin eye injections  . Calcium Carbonate-Vitamin D (CALCIUM 600 + D PO) Take 1 tablet by mouth every morning.   . docusate sodium (COLACE) 100 MG capsule Take 100 mg by mouth at bedtime.   Marland Kitchen ELIQUIS 2.5 MG TABS tablet TAKE 1 TABLET BY MOUTH TWICE A DAY  . fexofenadine (ALLEGRA) 180 MG tablet Take 180 mg by mouth  every other day.   . gabapentin (NEURONTIN) 100 MG capsule Take 100 mg by mouth 3 (three) times daily.   Marland Kitchen levothyroxine (SYNTHROID, LEVOTHROID) 88 MCG tablet Take 88 mcg by mouth daily before breakfast.   . losartan (COZAAR) 100 MG tablet TAKE 1 TABLET BY MOUTH EVERY DAY  . metoprolol tartrate (LOPRESSOR) 25 MG tablet TAKE 1 TABLET BY MOUTH TWICE A DAY  . Multiple Vitamin (MULITIVITAMIN WITH MINERALS) TABS Take 1 tablet by mouth daily.    Marland Kitchen oxybutynin (DITROPAN) 5 MG tablet Take 5 mg by mouth every other day.  . polyethylene glycol (MIRALAX / GLYCOLAX) packet Take 17 g by mouth daily as needed for mild constipation.   . predniSONE (DELTASONE) 5 MG tablet TAKE 1 TABLET BY MOUTH EVERY DAY WITH BREAKFAST  . Probiotic Product (PROBIOTIC PO) Take 1 capsule by mouth daily.     Allergies:   Ibuprofen, Nucynta [tapentadol], Ace inhibitors, Doxycycline, Hydrocodone, Nsaids, Adhesive [tape], and Latex   Social History  Socioeconomic History  . Marital status: Widowed    Spouse name: Not on file  . Number of children: 4  . Years of education: 68  . Highest education level: 12th grade  Occupational History  . Occupation: retired Scientist, clinical (histocompatibility and immunogenetics)  Tobacco Use  . Smoking status: Former Smoker    Packs/day: 0.50    Years: 10.00    Pack years: 5.00    Types: Cigarettes    Quit date: 12/24/1978    Years since quitting: 41.9  . Smokeless tobacco: Never Used  Vaping Use  . Vaping Use: Never used  Substance and Sexual Activity  . Alcohol use: No  . Drug use: No  . Sexual activity: Not Currently    Birth control/protection: None  Other Topics Concern  . Not on file  Social History Narrative   Lives at [place]   Phone [phone]   [phone]   Caffeine use: Drinks coffee- 2 cups per day at the most per pt   Social Determinants of Health   Financial Resource Strain:   . Difficulty of Paying Living Expenses: Not on file  Food Insecurity:   . Worried About Charity fundraiser in  the Last Year: Not on file  . Ran Out of Food in the Last Year: Not on file  Transportation Needs:   . Lack of Transportation (Medical): Not on file  . Lack of Transportation (Non-Medical): Not on file  Physical Activity:   . Days of Exercise per Week: Not on file  . Minutes of Exercise per Session: Not on file  Stress:   . Feeling of Stress : Not on file  Social Connections:   . Frequency of Communication with Friends and Family: Not on file  . Frequency of Social Gatherings with Friends and Family: Not on file  . Attends Religious Services: Not on file  . Active Member of Clubs or Organizations: Not on file  . Attends Archivist Meetings: Not on file  . Marital Status: Not on file     Family History: The patient's family history includes Breast cancer in her sister; Cancer in her mother and sister; Heart attack in her father; Heart disease in her father; Heart failure in her brother; Lymphoma in her sister.  ROS:   Please see the history of present illness.    All other systems reviewed and are negative.  EKGs/Labs/Other Studies Reviewed:    The following studies were reviewed today: Carol [name], DOB 05-Feb-1928, MRN [id number]  EVENT MONITOR REPORT:   Patient was monitored from 11/05/2011 to 11/07/2020. Indication:                    Atrial fibrillation Ordering physician:  Jenean Lindau, MD  Referring physician:        Jenean Lindau, MD    Baseline rhythm: Sinus  Minimum heart rate: 59 BPM.  Average heart rate: 88 BPM.  Maximal heart rate 129 BPM.  Atrial arrhythmia: Atrial runs longest lasting 15.9 seconds with an average rate of 130/min  Ventricular arrhythmia: 4 episodes of nonsustained ventricular tachycardia longest lasting 5 beats.  Ventricular bigeminy and trigeminy were also noted.  Conduction abnormality: Patient has high-grade AV conduction defects.  4 episodes of high-grade AV blocks were seen.  Also was noted Mobitz 1 AV  block.  Symptoms: None significant   Conclusion:  Abnormal event monitor with high-grade AV block.  In view of the fact that the patient has tachybradycardia  syndrome and history of atrial fibrillation I will refer the patient To our electrophysiology colleagues for consideration for permanent pacing.  Interpreting  cardiologist: Jenean Lindau, MD  Date: 11/21/2020 12:09 PM    Recent Labs: 11/04/2020: BUN 27; Creatinine, Ser 0.98; Hemoglobin 12.0; Platelets 209; Potassium 4.7; Sodium 132; TSH 2.440  Recent Lipid Panel No results found for: CHOL, TRIG, HDL, CHOLHDL, VLDL, LDLCALC, LDLDIRECT  Physical Exam:    VS:  BP 136/72   Pulse 74   Ht 5\' 2"  (1.575 m)   Wt 145 lb 1.3 oz (65.8 kg)   SpO2 96%   BMI 26.54 kg/m     Wt Readings from Last 3 Encounters:  11/23/20 145 lb 1.3 oz (65.8 kg)  11/04/20 146 lb (66.2 kg)  09/02/20 147 lb 1.6 oz (66.7 kg)     GEN: Patient is in no acute distress HEENT: Normal NECK: No JVD; No carotid bruits LYMPHATICS: No lymphadenopathy CARDIAC: Hear sounds regular, 2/6 systolic murmur at the apex. RESPIRATORY:  Clear to auscultation without rales, wheezing or rhonchi  ABDOMEN: Soft, non-tender, non-distended MUSCULOSKELETAL:  No edema; No deformity  SKIN: Warm and dry NEUROLOGIC:  Alert and oriented x 3 PSYCHIATRIC:  Normal affect   Signed, Jenean Lindau, MD  11/23/2020 2:15 PM    [facility]

## 2020-11-28 ENCOUNTER — Inpatient Hospital Stay: Payer: Medicare Other | Attending: Oncology | Admitting: Nurse Practitioner

## 2020-11-28 ENCOUNTER — Inpatient Hospital Stay: Payer: Medicare Other

## 2020-11-28 ENCOUNTER — Encounter: Payer: Self-pay | Admitting: Nurse Practitioner

## 2020-11-28 ENCOUNTER — Telehealth: Payer: Self-pay | Admitting: Cardiology

## 2020-11-28 ENCOUNTER — Other Ambulatory Visit: Payer: Self-pay

## 2020-11-28 ENCOUNTER — Telehealth: Payer: Self-pay | Admitting: Oncology

## 2020-11-28 VITALS — BP 112/77 | HR 73 | Temp 97.3°F | Resp 17 | Ht 62.0 in | Wt 146.8 lb

## 2020-11-28 DIAGNOSIS — Z7901 Long term (current) use of anticoagulants: Secondary | ICD-10-CM | POA: Diagnosis not present

## 2020-11-28 DIAGNOSIS — E785 Hyperlipidemia, unspecified: Secondary | ICD-10-CM | POA: Diagnosis not present

## 2020-11-28 DIAGNOSIS — Z7952 Long term (current) use of systemic steroids: Secondary | ICD-10-CM | POA: Insufficient documentation

## 2020-11-28 DIAGNOSIS — Z952 Presence of prosthetic heart valve: Secondary | ICD-10-CM | POA: Insufficient documentation

## 2020-11-28 DIAGNOSIS — E039 Hypothyroidism, unspecified: Secondary | ICD-10-CM | POA: Diagnosis not present

## 2020-11-28 DIAGNOSIS — I2510 Atherosclerotic heart disease of native coronary artery without angina pectoris: Secondary | ICD-10-CM | POA: Diagnosis not present

## 2020-11-28 DIAGNOSIS — D591 Autoimmune hemolytic anemia, unspecified: Secondary | ICD-10-CM

## 2020-11-28 DIAGNOSIS — R21 Rash and other nonspecific skin eruption: Secondary | ICD-10-CM | POA: Diagnosis not present

## 2020-11-28 DIAGNOSIS — Z87440 Personal history of urinary (tract) infections: Secondary | ICD-10-CM | POA: Diagnosis not present

## 2020-11-28 LAB — CMP (CANCER CENTER ONLY)
ALT: 14 U/L (ref 0–44)
AST: 19 U/L (ref 15–41)
Albumin: 3.8 g/dL (ref 3.5–5.0)
Alkaline Phosphatase: 50 U/L (ref 38–126)
Anion gap: 9 (ref 5–15)
BUN: 21 mg/dL (ref 8–23)
CO2: 26 mmol/L (ref 22–32)
Calcium: 9.1 mg/dL (ref 8.9–10.3)
Chloride: 98 mmol/L (ref 98–111)
Creatinine: 0.93 mg/dL (ref 0.44–1.00)
GFR, Estimated: 58 mL/min — ABNORMAL LOW (ref >60)
Glucose, Bld: 114 mg/dL — ABNORMAL HIGH (ref 70–99)
Potassium: 4.7 mmol/L (ref 3.5–5.1)
Sodium: 133 mmol/L — ABNORMAL LOW (ref 135–145)
Total Bilirubin: 0.8 mg/dL (ref 0.3–1.2)
Total Protein: 6.3 g/dL — ABNORMAL LOW (ref 6.5–8.1)

## 2020-11-28 LAB — CBC WITH DIFFERENTIAL (CANCER CENTER ONLY)
Abs Immature Granulocytes: 0.03 10*3/uL (ref 0.00–0.07)
Basophils Absolute: 0 10*3/uL (ref 0.0–0.1)
Basophils Relative: 1 %
Eosinophils Absolute: 0.1 10*3/uL (ref 0.0–0.5)
Eosinophils Relative: 1 %
HCT: 34.5 % — ABNORMAL LOW (ref 36.0–46.0)
Hemoglobin: 11.2 g/dL — ABNORMAL LOW (ref 12.0–15.0)
Immature Granulocytes: 0 %
Lymphocytes Relative: 10 %
Lymphs Abs: 0.9 10*3/uL (ref 0.7–4.0)
MCH: 28 pg (ref 26.0–34.0)
MCHC: 32.5 g/dL (ref 30.0–36.0)
MCV: 86.3 fL (ref 80.0–100.0)
Monocytes Absolute: 0.6 10*3/uL (ref 0.1–1.0)
Monocytes Relative: 7 %
Neutro Abs: 7 10*3/uL (ref 1.7–7.7)
Neutrophils Relative %: 81 %
Platelet Count: 195 10*3/uL (ref 150–400)
RBC: 4 MIL/uL (ref 3.87–5.11)
RDW: 14.2 % (ref 11.5–15.5)
WBC Count: 8.7 10*3/uL (ref 4.0–10.5)
nRBC: 0 % (ref 0.0–0.2)

## 2020-11-28 LAB — RETICULOCYTES
Immature Retic Fract: 18.6 % — ABNORMAL HIGH (ref 2.3–15.9)
RBC.: 4.01 MIL/uL (ref 3.87–5.11)
Retic Count, Absolute: 136.7 10*3/uL (ref 19.0–186.0)
Retic Ct Pct: 3.4 % — ABNORMAL HIGH (ref 0.4–3.1)

## 2020-11-28 NOTE — Progress Notes (Addendum)
  Carol [name]   Diagnosis: Autoimmune hemolytic anemia  INTERVAL HISTORY:   Carol [name] returns as scheduled.  No interim illnesses or infections.  She reports a skin rash in various areas on her chest and back.  No fevers or sweats.  She describes her appetite as "medium".  She continues prednisone 5 mg daily.  Objective:  Vital signs in last 24 hours:  Blood pressure 112/77, pulse 73, temperature (!) 97.3 F (36.3 C), temperature source Tympanic, resp. rate 17, height 5\' 2"  (1.575 m), weight 146 lb 12.8 oz (66.6 kg), SpO2 97 %.    HEENT: No thrush or ulcers. Resp: Lungs clear bilaterally. Cardio: Regular rate and rhythm. GI: No hepatosplenomegaly. Vascular: No leg edema. Skin: Erythematous rash, hive-like in some areas, scattered over the trunk.   Lab Results:  Lab Results  Component Value Date   WBC 8.7 11/28/2020   HGB 11.2 (L) 11/28/2020   HCT 34.5 (L) 11/28/2020   MCV 86.3 11/28/2020   PLT 195 11/28/2020   NEUTROABS 7.0 11/28/2020    Imaging:  No results found.  Medications: I have reviewed the patient's current medications.  Assessment/Plan: 1. Autoimmune hemolytic anemia, status post 2 units of packed red blood cells 05/14/2018, prednisone 05/15/2018, Solu-Medrol started 05/16/2018;prednisone 60 mg daily beginning 05/19/2018  Prednisone taper to 40 mg daily beginning 05/23/2018  Prednisone taper to 30 mg daily beginning 05/30/2018  Prednisone taper to 20 mg daily beginning 06/13/2018  Prednisone taper to 15 mg daily beginning 06/28/2018  Prednisone taper to 10 mg daily beginning 07/23/2018  Prednisone taper to 5 mg daily beginning 09/01/2018  Prednisone dose increased 09/16/2018 due to a rash,20 mg for 2 weeks followed by 10 mg per dermatology  Prednisone decreased to 5 mg daily beginning 10/14/2018  Prednisone decreased to 2.5 mg daily approximately 11/18/2018  Prednisone decreased to 2.5 mg every other day  12/09/2018  Prednisone increased to 2.5 mg daily 12/15/2018 due to recurrent rash  Prednisone changed to 1 mg every other day  Recurrent hemolysis 07/13/2019, prednisone 40 mg daily  Prednisone taper to 30 mg daily beginning 07/24/2019  Prednisone taper to 20 mg daily beginning 08/07/2019  Prednisone taper to 15 mg daily beginning 09/01/2019  Prednisone taper to 10 mg daily beginning 10/02/2019  Prednisone taper to 5 mg daily beginning 11/02/2019  Prednisone 5 mg daily continued  2. History of coronary artery disease  3. Status post aortic valve replacement, maintained on Coumadin  4. Hyperlipidemia  5. Hypothyroidism  6.Hospitalization 08/17/2018 through 08/20/2018 with E. coli UTI  7.   Recurrent rash 11/28/2020 -she will follow up with Dr. Ronnald Ramp   Disposition: Carol [name] appears unchanged.  We reviewed the CBC from today.  Hemoglobin is slightly lower.  We are adding a reticulocyte count.  She will return for lab and follow-up in 3 to 4 weeks.  She has a recurrent rash over the trunk.  We made a referral to Dr. Ronnald Ramp.  Patient seen with Dr. Benay Spice.    [name] ANP/GNP-BC   11/28/2020  12:22 PM This was a shared visit with [name].  Carol [name] was interviewed and examined.  The hemoglobin is slightly lower today, but she is not mounting a significant reticulocytosis.  She will continue prednisone at the current dose.  She has an erythematous hive-like rash over the trunk.  She will follow up with Dr. Ronnald Ramp.  Julieanne Manson, MD

## 2020-11-28 NOTE — Telephone Encounter (Signed)
Pt c/o medication issue:  1. Name of Medication:   metoprolol tartrate (LOPRESSOR) 25 MG tablet     2. How are you currently taking this medication (dosage and times per day)? 1 tablet (25 mg total) by mouth daily  3. Are you having a reaction (difficulty breathing--STAT)? No   4. What is your medication issue? Patient's daughter would like to know if decreasing this medication will interfere with consideration for permanent pacing. She requested a call back to discuss. If call is returned before 5:00 PM, please contact daughter's work number, [phone].

## 2020-11-28 NOTE — Telephone Encounter (Signed)
Scheduled appointments per 12/6 los. Spoke to patient who is aware of appointment date and time.

## 2020-11-28 NOTE — Telephone Encounter (Signed)
I do not think it would so not to worry about it.

## 2020-11-28 NOTE — Telephone Encounter (Signed)
Recommendations given as per Dr. Geraldo Pitter. [name] verbalized understanding and will keep appointment with [facility] for further discussion.

## 2020-11-29 ENCOUNTER — Ambulatory Visit: Payer: Medicare Other | Admitting: Cardiology

## 2020-11-30 ENCOUNTER — Ambulatory Visit (INDEPENDENT_AMBULATORY_CARE_PROVIDER_SITE_OTHER): Payer: Medicare Other | Admitting: Cardiology

## 2020-11-30 ENCOUNTER — Encounter: Payer: Self-pay | Admitting: Cardiology

## 2020-11-30 ENCOUNTER — Other Ambulatory Visit: Payer: Self-pay

## 2020-11-30 VITALS — BP 158/72 | HR 84 | Ht 62.0 in | Wt 147.2 lb

## 2020-11-30 DIAGNOSIS — I495 Sick sinus syndrome: Secondary | ICD-10-CM

## 2020-11-30 DIAGNOSIS — I442 Atrioventricular block, complete: Secondary | ICD-10-CM | POA: Diagnosis not present

## 2020-11-30 DIAGNOSIS — I480 Paroxysmal atrial fibrillation: Secondary | ICD-10-CM

## 2020-11-30 NOTE — Progress Notes (Signed)
Electrophysiology Office Note:    Date:  11/30/2020   ID:  Carol [name], DOB [birth year], MRN [id number]  PCP:  Lujean Amel, MD  [facility] Cardiologist:  No primary care provider on file.  [facility] Electrophysiologist:  None   Referring MD: Jenean Lindau, MD   Chief Complaint: Tachycardia-bradycardia  History of Present Illness:    Carol [name] is a 86 y.o. female who presents for an evaluation of tachycardia-bradycardia at the request of Dr. Geraldo Pitter. Their medical history includes autoimmune hemolytic anemia, aortic stenosis, atrial fibrillation, coronary artery disease, hypertension, hyperlipidemia, hypothyroidism, obesity.  Patient tells me she has never had a syncopal episode.  She is very active at her senior living facility.  She usually walks down a long hallway to the dinner area in the evenings.  When she walked back sometimes she has to take some breaks.  She is tolerating metoprolol 25 mg every morning.  Past Medical History:  Diagnosis Date  . Acute blood loss anemia 05/16/2018  . AIHA (autoimmune hemolytic anemia) (HCC)   . Aortic stenosis   . Atrial fibrillation ([place])    Noted during recent cardiac surgery and treated with amiodarone   . CAD (coronary artery disease) 12/26/2011   Severe ostial LM disease noted at cath 12/26/11   . Chronic diastolic heart failure ([place]) 01/17/2012  . Coronary artery disease    Cath 1/13 left main  . 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 Dyspnea   . Elevated bilirubin 05/14/2018  . Hyperlipidemia   . Hypertension   . Hypertensive heart disease without CHF   . Hyponatremia 01/17/2012  . Hypothyroidism   . Long term (current) use of anticoagulants 07/14/2013  . Lumbar degenerative disc disease    Prior lumbar laminectomy 12/2009 Dr. Rolena Infante.  Failed epidural steroids.  Currently on gabapentin   . Lumbar disc disease   . Lung nodules    Sees [facility], thought to be benign  . Obesity   .  Obstructive jaundice 05/14/2018  . PAF (paroxysmal atrial fibrillation) ([place]) 01/30/2019  . Personal history of solitary pulmonary nodule 11/07/2011   Followed in Pulmonary clinic/ [facility]/ [facility]   - PET 11/19/11 indeterminant LUL nodule but slt reduced in size vs prev studies so rec f/u cxr in 2 months (tickle file) Wedge resection by Dr. Servando Snare at time of AVR/CABG 12/28/2011 >   wedge biopsy/resection, Left upper lobe - NON-NECROTIZING GRANULOMATOUS INFLAMMATION WITH ASSOCIATED MULTINUCLEATED GIANT CELLS.   . S/P AVR and CABG 12/28/2011   19 mm Magna Ease pericardial tissue valve CABG x 3 (LIMA-LAD, SVG-Int, SVG-dRCA)12/28/2011 Dr. Servando Snare    . Sepsis ([place]) 08/17/2018  . Spinal stenosis   . Villous adenoma of rectum     Past Surgical History:  Procedure Laterality Date  . AORTIC VALVE REPLACEMENT  12/28/2011   Procedure: AORTIC VALVE REPLACEMENT (AVR);  Surgeon: Grace Isaac, MD;  Location: [facility];  Service: Open Heart Surgery;  Laterality: N/A;  . CARDIAC CATHETERIZATION  2013  . CHOLECYSTECTOMY    . CORONARY ARTERY BYPASS GRAFT  12/28/2011   Procedure: CORONARY ARTERY BYPASS GRAFTING (CABG);  Surgeon: Grace Isaac, MD;  Location: [facility];  Service: Open Heart Surgery;  Laterality: N/A;  . HERNIA REPAIR    . LEFT AND RIGHT HEART CATHETERIZATION WITH CORONARY ANGIOGRAM N/A 12/26/2011   Procedure: LEFT AND RIGHT HEART CATHETERIZATION WITH CORONARY ANGIOGRAM;  Surgeon: Jacolyn Reedy, MD;  Location: [facility] CATH LAB;  Service: Cardiovascular;  Laterality: N/A;  . LUMBAR LAMINECTOMY    . PARTIAL COLECTOMY  2008   Villous adenoma    Current Medications: Current Meds  Medication Sig  . ALPRAZolam (XANAX) 0.25 MG tablet Take 0.25 mg by mouth at bedtime.  Marland Kitchen atorvastatin (LIPITOR) 20 MG tablet TAKE 1 TABLET BY MOUTH EVERYDAY AT BEDTIME  . BESIVANCE 0.6 % SUSP Place 1 drop into the left eye See admin instructions. Place 1 drop left eye 4 times daily for 2 days after Avastin eye injections   . Calcium Carbonate-Vitamin D (CALCIUM 600 + D PO) Take 1 tablet by mouth every morning.   . docusate sodium (COLACE) 100 MG capsule Take 100 mg by mouth at bedtime.   Marland Kitchen ELIQUIS 2.5 MG TABS tablet TAKE 1 TABLET BY MOUTH TWICE A DAY  . fexofenadine (ALLEGRA) 180 MG tablet Take 180 mg by mouth every other day.   . gabapentin (NEURONTIN) 100 MG capsule Take 100 mg by mouth 3 (three) times daily.   Marland Kitchen levothyroxine (SYNTHROID, LEVOTHROID) 88 MCG tablet Take 88 mcg by mouth daily before breakfast.   . losartan (COZAAR) 100 MG tablet TAKE 1 TABLET BY MOUTH EVERY DAY  . metoprolol tartrate (LOPRESSOR) 25 MG tablet Take 1 tablet (25 mg total) by mouth in the morning.  . Multiple Vitamin (MULITIVITAMIN WITH MINERALS) TABS Take 1 tablet by mouth daily.    Marland Kitchen oxybutynin (DITROPAN) 5 MG tablet Take 5 mg by mouth every other day.  . polyethylene glycol (MIRALAX / GLYCOLAX) packet Take 17 g by mouth daily as needed for mild constipation.   . predniSONE (DELTASONE) 5 MG tablet Take 10 mg by mouth daily with breakfast.  . Probiotic Product (PROBIOTIC PO) Take 1 capsule by mouth daily.     Allergies:   Ibuprofen, Nucynta [tapentadol], Ace inhibitors, Doxycycline, Hydrocodone, Nsaids, Adhesive [tape], and Latex   Social History   Socioeconomic History  . Marital status: Widowed    Spouse name: Not on file  . Number of children: 4  . Years of education: 64  . Highest education level: 12th grade  Occupational History  . Occupation: retired Scientist, clinical (histocompatibility and immunogenetics)  Tobacco Use  . Smoking status: Former Smoker    Packs/day: 0.50    Years: 10.00    Pack years: 5.00    Types: Cigarettes    Quit date: 12/24/1978    Years since quitting: 41.9  . Smokeless tobacco: Never Used  Vaping Use  . Vaping Use: Never used  Substance and Sexual Activity  . Alcohol use: No  . Drug use: No  . Sexual activity: Not Currently    Birth control/protection: None  Other Topics Concern  . Not on file  Social History Narrative   Lives at  [place]   Phone [phone]   [phone]   Caffeine use: Drinks coffee- 2 cups per day at the most per pt   Social Determinants of Health   Financial Resource Strain:   . Difficulty of Paying Living Expenses: Not on file  Food Insecurity:   . Worried About Charity fundraiser in the Last Year: Not on file  . Ran Out of Food in the Last Year: Not on file  Transportation Needs:   . Lack of Transportation (Medical): Not on file  . Lack of Transportation (Non-Medical): Not on file  Physical Activity:   . Days of Exercise per Week: Not on file  . Minutes of Exercise per Session: Not on file  Stress:   .  Feeling of Stress : Not on file  Social Connections:   . Frequency of Communication with Friends and Family: Not on file  . Frequency of Social Gatherings with Friends and Family: Not on file  . Attends Religious Services: Not on file  . Active Member of Clubs or Organizations: Not on file  . Attends Archivist Meetings: Not on file  . Marital Status: Not on file     Family History: The patient's family history includes Breast cancer in her sister; Cancer in her mother and sister; Heart attack in her father; Heart disease in her father; Heart failure in her brother; Lymphoma in her sister.  ROS:   Please see the history of present illness.    All other systems reviewed and are negative.  EKGs/Labs/Other Studies Reviewed:    The following studies were reviewed today: ZIO, prior notes  November 04, 2020 ZIO personally reviewed Heart rate 27-203, average 88 6 episodes of SVT, longest lasting 15.9 seconds 3episodes of high degree AV block occurred with the longest lasting 9 seconds.  None of these episodes were patient triggered.  These episodes occurred just after midnight, just after 2:30 AM, just before 4:30 AM and another episode just after midnight   EKG:  The ekg ordered today demonstrates sinus rhythm, PVC, frequent PACs  Recent  Labs: 11/04/2020: TSH 2.440 11/28/2020: ALT 14; BUN 21; Creatinine 0.93; Hemoglobin 11.2; Platelet Count 195; Potassium 4.7; Sodium 133  Recent Lipid Panel No results found for: CHOL, TRIG, HDL, CHOLHDL, VLDL, LDLCALC, LDLDIRECT  Physical Exam:    VS:  BP (!) 158/72   Pulse 84   Ht 5\' 2"  (1.575 m)   Wt 147 lb 3.2 oz (66.8 kg)   SpO2 98%   BMI 26.92 kg/m     Wt Readings from Last 3 Encounters:  11/30/20 147 lb 3.2 oz (66.8 kg)  11/28/20 146 lb 12.8 oz (66.6 kg)  11/23/20 145 lb 1.3 oz (65.8 kg)     GEN:  Well nourished, well developed in no acute distress.  Appears younger than stated age. HEENT: Normal NECK: No JVD; No carotid bruits LYMPHATICS: No lymphadenopathy CARDIAC: RRR, no murmurs, rubs, gallops RESPIRATORY:  Clear to auscultation without rales, wheezing or rhonchi  ABDOMEN: Soft, non-tender, non-distended MUSCULOSKELETAL:  No edema; No deformity  SKIN: Warm and dry NEUROLOGIC:  Alert and oriented x 3 PSYCHIATRIC:  Normal affect   ASSESSMENT:    1. Heart block AV complete ([place])   2. PAF (paroxysmal atrial fibrillation) ([place])   3. Tachycardia-bradycardia syndrome ([place])    PLAN:    In order of problems listed above:  1. Nocturnal complete heart block Patient recently with a monitor that showed 3 episodes of high degree AV block during the asleep hours.  No episodes during awake hours.  She is never had a syncopal episode.  At this point, I favor a conservative approach.  I would continue the metoprolol 25 mg every morning.  We discussed the indications for permanent pacing and how at this time I did not think that was indicated.  The patient and her daughter were in agreement with this plan.  We will plan to touch base in the future as needed.  2.  Paroxysmal atrial fibrillation Doing well on Eliquis for stroke prevention.  Continue metoprolol 25 mg every morning    Medication Adjustments/Labs and Tests Ordered: Current medicines are reviewed at length with  the patient today.  Concerns regarding medicines are outlined above.  Orders  Placed This Encounter  Procedures  . EKG 12-Lead   No orders of the defined types were placed in this encounter.    Signed, Lars Mage, MD, [facility]  11/30/2020 2:45 PM    Electrophysiology [facility]

## 2020-11-30 NOTE — Patient Instructions (Addendum)
Medication Instructions:  Your physician recommends that you continue on your current medications as directed. Please refer to the Current Medication list given to you today.  Labwork: None ordered.  Testing/Procedures: None ordered.  Follow-Up: Your physician wants you to follow-up in: as needed with Dr. Lambert.    Any Other Special Instructions Will Be Listed Below (If Applicable).  If you need a refill on your cardiac medications before your next appointment, please call your pharmacy.   

## 2020-12-04 ENCOUNTER — Other Ambulatory Visit: Payer: Self-pay | Admitting: Cardiology

## 2020-12-14 ENCOUNTER — Other Ambulatory Visit: Payer: Self-pay

## 2020-12-14 ENCOUNTER — Encounter (INDEPENDENT_AMBULATORY_CARE_PROVIDER_SITE_OTHER): Payer: Medicare Other | Admitting: Ophthalmology

## 2020-12-14 DIAGNOSIS — H35033 Hypertensive retinopathy, bilateral: Secondary | ICD-10-CM

## 2020-12-14 DIAGNOSIS — H43813 Vitreous degeneration, bilateral: Secondary | ICD-10-CM

## 2020-12-14 DIAGNOSIS — I10 Essential (primary) hypertension: Secondary | ICD-10-CM

## 2020-12-14 DIAGNOSIS — H3411 Central retinal artery occlusion, right eye: Secondary | ICD-10-CM | POA: Diagnosis not present

## 2020-12-14 DIAGNOSIS — H34812 Central retinal vein occlusion, left eye, with macular edema: Secondary | ICD-10-CM | POA: Diagnosis not present

## 2020-12-19 ENCOUNTER — Other Ambulatory Visit: Payer: Self-pay | Admitting: Oncology

## 2020-12-19 DIAGNOSIS — D591 Autoimmune hemolytic anemia, unspecified: Secondary | ICD-10-CM

## 2020-12-26 ENCOUNTER — Other Ambulatory Visit: Payer: Medicare Other

## 2020-12-26 ENCOUNTER — Telehealth: Payer: Self-pay | Admitting: Oncology

## 2020-12-26 ENCOUNTER — Ambulatory Visit: Payer: Medicare Other | Admitting: Oncology

## 2020-12-26 NOTE — Telephone Encounter (Signed)
Rescheduled appointment per 1/3 schedule message. Patient is aware of changes. 

## 2020-12-27 ENCOUNTER — Inpatient Hospital Stay: Payer: Medicare Other | Attending: Oncology

## 2020-12-27 ENCOUNTER — Telehealth: Payer: Self-pay | Admitting: Oncology

## 2020-12-27 ENCOUNTER — Inpatient Hospital Stay (HOSPITAL_BASED_OUTPATIENT_CLINIC_OR_DEPARTMENT_OTHER): Payer: Medicare Other | Admitting: Oncology

## 2020-12-27 ENCOUNTER — Other Ambulatory Visit: Payer: Self-pay

## 2020-12-27 VITALS — BP 179/48 | HR 92 | Temp 98.5°F | Resp 18 | Ht 62.0 in | Wt 145.0 lb

## 2020-12-27 DIAGNOSIS — Z7952 Long term (current) use of systemic steroids: Secondary | ICD-10-CM | POA: Insufficient documentation

## 2020-12-27 DIAGNOSIS — E785 Hyperlipidemia, unspecified: Secondary | ICD-10-CM | POA: Insufficient documentation

## 2020-12-27 DIAGNOSIS — Z7901 Long term (current) use of anticoagulants: Secondary | ICD-10-CM | POA: Insufficient documentation

## 2020-12-27 DIAGNOSIS — R5381 Other malaise: Secondary | ICD-10-CM | POA: Insufficient documentation

## 2020-12-27 DIAGNOSIS — I2510 Atherosclerotic heart disease of native coronary artery without angina pectoris: Secondary | ICD-10-CM | POA: Diagnosis not present

## 2020-12-27 DIAGNOSIS — R0609 Other forms of dyspnea: Secondary | ICD-10-CM | POA: Diagnosis not present

## 2020-12-27 DIAGNOSIS — R21 Rash and other nonspecific skin eruption: Secondary | ICD-10-CM | POA: Insufficient documentation

## 2020-12-27 DIAGNOSIS — Z79899 Other long term (current) drug therapy: Secondary | ICD-10-CM | POA: Diagnosis not present

## 2020-12-27 DIAGNOSIS — E039 Hypothyroidism, unspecified: Secondary | ICD-10-CM | POA: Insufficient documentation

## 2020-12-27 DIAGNOSIS — Z952 Presence of prosthetic heart valve: Secondary | ICD-10-CM | POA: Diagnosis not present

## 2020-12-27 DIAGNOSIS — D591 Autoimmune hemolytic anemia, unspecified: Secondary | ICD-10-CM | POA: Diagnosis not present

## 2020-12-27 LAB — CBC WITH DIFFERENTIAL (CANCER CENTER ONLY)
Abs Immature Granulocytes: 0.02 10*3/uL (ref 0.00–0.07)
Basophils Absolute: 0 10*3/uL (ref 0.0–0.1)
Basophils Relative: 1 %
Eosinophils Absolute: 0 10*3/uL (ref 0.0–0.5)
Eosinophils Relative: 0 %
HCT: 38.6 % (ref 36.0–46.0)
Hemoglobin: 12 g/dL (ref 12.0–15.0)
Immature Granulocytes: 0 %
Lymphocytes Relative: 11 %
Lymphs Abs: 0.9 10*3/uL (ref 0.7–4.0)
MCH: 27.3 pg (ref 26.0–34.0)
MCHC: 31.1 g/dL (ref 30.0–36.0)
MCV: 87.7 fL (ref 80.0–100.0)
Monocytes Absolute: 0.4 10*3/uL (ref 0.1–1.0)
Monocytes Relative: 5 %
Neutro Abs: 6.7 10*3/uL (ref 1.7–7.7)
Neutrophils Relative %: 83 %
Platelet Count: 187 10*3/uL (ref 150–400)
RBC: 4.4 MIL/uL (ref 3.87–5.11)
RDW: 15.9 % — ABNORMAL HIGH (ref 11.5–15.5)
WBC Count: 8.1 10*3/uL (ref 4.0–10.5)
nRBC: 0 % (ref 0.0–0.2)

## 2020-12-27 LAB — RETICULOCYTES
Immature Retic Fract: 22.5 % — ABNORMAL HIGH (ref 2.3–15.9)
RBC.: 4.36 MIL/uL (ref 3.87–5.11)
Retic Count, Absolute: 184.9 10*3/uL (ref 19.0–186.0)
Retic Ct Pct: 4.2 % — ABNORMAL HIGH (ref 0.4–3.1)

## 2020-12-27 MED ORDER — PREDNISONE 5 MG PO TABS: 10.0000 mg | ORAL_TABLET | Freq: Every day | ORAL | 0 refills | Status: DC

## 2020-12-27 NOTE — Progress Notes (Signed)
Carol [name] OFFICE PROGRESS NOTE   Diagnosis: Autoimmune hemolytic anemia  INTERVAL HISTORY:   Carol [name] returns for a scheduled visit.  She saw Dr. Ronnald Ramp on 12/12/2020 and was started on Claritin for the skin rash.  The prednisone dose was increased to 10 mg daily.  She reports the rash improved within a few days and has now almost completely resolved.  She reports malaise.  She has mild exertional dyspnea.  No new complaint.  Objective:  Vital signs in last 24 hours:  Blood pressure (!) 179/48, pulse 92, temperature 98.5 F (36.9 C), temperature source Tympanic, resp. rate 18, height 5\' 2"  (1.575 m), weight 145 lb (65.8 kg), SpO2 98 %.    HEENT: No thrush Lymphatics: No cervical, supraclavicular, axillary, or inguinal nodes Resp: Inspiratory/expiratory rhonchi in the posterior lung bases bilaterally, no respiratory distress Cardio: Regular rate and rhythm GI: No hepatosplenomegaly Vascular: No leg edema  Skin: Few 3-4 mm flat erythematous lesions at the lower abdominal wall    Lab Results:  Lab Results  Component Value Date   WBC 8.1 12/27/2020   HGB 12.0 12/27/2020   HCT 38.6 12/27/2020   MCV 87.7 12/27/2020   PLT 187 12/27/2020   NEUTROABS 6.7 12/27/2020    CMP  Lab Results  Component Value Date   NA 133 (L) 11/28/2020   K 4.7 11/28/2020   CL 98 11/28/2020   CO2 26 11/28/2020   GLUCOSE 114 (H) 11/28/2020   BUN 21 11/28/2020   CREATININE 0.93 11/28/2020   CALCIUM 9.1 11/28/2020   PROT 6.3 (L) 11/28/2020   ALBUMIN 3.8 11/28/2020   AST 19 11/28/2020   ALT 14 11/28/2020   ALKPHOS 50 11/28/2020   BILITOT 0.8 11/28/2020   GFRNONAA 58 (L) 11/28/2020   GFRAA 59 (L) 11/04/2020    Medications: I have reviewed the patient's current medications.   Assessment/Plan: 1. Autoimmune hemolytic anemia, status post 2 units of packed red blood cells 05/14/2018, prednisone 05/15/2018, Solu-Medrol started 05/16/2018;prednisone 60 mg daily beginning  05/19/2018  Prednisone taper to 40 mg daily beginning 05/23/2018  Prednisone taper to 30 mg daily beginning 05/30/2018  Prednisone taper to 20 mg daily beginning 06/13/2018  Prednisone taper to 15 mg daily beginning 06/28/2018  Prednisone taper to 10 mg daily beginning 07/23/2018  Prednisone taper to 5 mg daily beginning 09/01/2018  Prednisone dose increased 09/16/2018 due to a rash,20 mg for 2 weeks followed by 10 mg per dermatology  Prednisone decreased to 5 mg daily beginning 10/14/2018  Prednisone decreased to 2.5 mg daily approximately 11/18/2018  Prednisone decreased to 2.5 mg every other day 12/09/2018  Prednisone increased to 2.5 mg daily 12/15/2018 due to recurrent rash  Prednisone changed to 1 mg every other day  Recurrent hemolysis 07/13/2019, prednisone 40 mg daily  Prednisone taper to 30 mg daily beginning 07/24/2019  Prednisone taper to 20 mg daily beginning 08/07/2019  Prednisone taper to 15 mg daily beginning 09/01/2019  Prednisone taper to 10 mg daily beginning 10/02/2019  Prednisone taper to 5 mg daily beginning 11/02/2019  Prednisone 5 mg daily continued  Prednisone increased to 10 mg daily 12/12/2020 secondary to progressive skin rash  2. History of coronary artery disease  3. Status post aortic valve replacement, maintained on Coumadin  4. Hyperlipidemia  5. Hypothyroidism  6.Hospitalization 08/17/2018 through 08/20/2018 with E. coli UTI  7.   Recurrent rash 11/28/2020 -much improved following initiation of Claritin and an increase in the prednisone to 10 mg daily beginning 12/12/2020  Disposition: Carol [name] appears unchanged.  The skin rash has almost completely resolved with Claritin and an increased dose of prednisone.  She will continue the current medical regimen.  She will follow-up with Dr. Ronnald Ramp for recommendations regarding taper of the prednisone and continuation of Claritin.  She is stable from a hematologic  standpoint.  Carol [name] will return for an office visit in 2 months.  She will contact us in the interim for new symptoms.  Carol Coder, MD  12/27/2020  3:22 PM

## 2020-12-27 NOTE — Addendum Note (Signed)
Addended by: Tania Ade on: 12/27/2020 05:03 PM   Modules accepted: Orders

## 2020-12-27 NOTE — Telephone Encounter (Signed)
Scheduled appointments per 1/4 los. Spoke to patient who is aware of appointments date and times. Gave patient calendar print out.  

## 2020-12-30 ENCOUNTER — Other Ambulatory Visit: Payer: Medicare Other

## 2020-12-30 ENCOUNTER — Ambulatory Visit: Payer: Medicare Other | Admitting: Oncology

## 2021-01-04 DIAGNOSIS — L97511 Non-pressure chronic ulcer of other part of right foot limited to breakdown of skin: Secondary | ICD-10-CM | POA: Diagnosis not present

## 2021-01-16 DIAGNOSIS — L97511 Non-pressure chronic ulcer of other part of right foot limited to breakdown of skin: Secondary | ICD-10-CM | POA: Diagnosis not present

## 2021-01-18 ENCOUNTER — Other Ambulatory Visit: Payer: Self-pay

## 2021-01-18 DIAGNOSIS — F419 Anxiety disorder, unspecified: Secondary | ICD-10-CM

## 2021-01-18 DIAGNOSIS — N3281 Overactive bladder: Secondary | ICD-10-CM | POA: Insufficient documentation

## 2021-01-18 DIAGNOSIS — R413 Other amnesia: Secondary | ICD-10-CM

## 2021-01-18 DIAGNOSIS — J301 Allergic rhinitis due to pollen: Secondary | ICD-10-CM

## 2021-01-18 DIAGNOSIS — D6859 Other primary thrombophilia: Secondary | ICD-10-CM

## 2021-01-18 DIAGNOSIS — K5900 Constipation, unspecified: Secondary | ICD-10-CM

## 2021-01-18 DIAGNOSIS — T819XXA Unspecified complication of procedure, initial encounter: Secondary | ICD-10-CM

## 2021-01-18 DIAGNOSIS — D692 Other nonthrombocytopenic purpura: Secondary | ICD-10-CM | POA: Insufficient documentation

## 2021-01-18 DIAGNOSIS — E782 Mixed hyperlipidemia: Secondary | ICD-10-CM

## 2021-01-18 DIAGNOSIS — I10 Essential (primary) hypertension: Secondary | ICD-10-CM

## 2021-01-18 DIAGNOSIS — I2510 Atherosclerotic heart disease of native coronary artery without angina pectoris: Secondary | ICD-10-CM

## 2021-01-18 DIAGNOSIS — M5430 Sciatica, unspecified side: Secondary | ICD-10-CM

## 2021-01-18 DIAGNOSIS — K635 Polyp of colon: Secondary | ICD-10-CM

## 2021-01-18 HISTORY — DX: Other primary thrombophilia: D68.59

## 2021-01-18 HISTORY — DX: Constipation, unspecified: K59.00

## 2021-01-18 HISTORY — DX: Sciatica, unspecified side: M54.30

## 2021-01-18 HISTORY — DX: Anxiety disorder, unspecified: F41.9

## 2021-01-18 HISTORY — DX: Other amnesia: R41.3

## 2021-01-18 HISTORY — DX: Unspecified complication of procedure, initial encounter: T81.9XXA

## 2021-01-18 HISTORY — DX: Other nonthrombocytopenic purpura: D69.2

## 2021-01-18 HISTORY — DX: Atherosclerotic heart disease of native coronary artery without angina pectoris: I25.10

## 2021-01-18 HISTORY — DX: Overactive bladder: N32.81

## 2021-01-18 HISTORY — DX: Essential (primary) hypertension: I10

## 2021-01-18 HISTORY — DX: Allergic rhinitis due to pollen: J30.1

## 2021-01-18 HISTORY — DX: Polyp of colon: K63.5

## 2021-01-18 HISTORY — DX: Mixed hyperlipidemia: E78.2

## 2021-01-20 ENCOUNTER — Encounter: Payer: Self-pay | Admitting: Cardiology

## 2021-01-20 ENCOUNTER — Other Ambulatory Visit: Payer: Self-pay

## 2021-01-20 ENCOUNTER — Ambulatory Visit (INDEPENDENT_AMBULATORY_CARE_PROVIDER_SITE_OTHER): Payer: Medicare Other | Admitting: Cardiology

## 2021-01-20 VITALS — BP 144/86 | HR 100 | Ht 62.0 in | Wt 147.1 lb

## 2021-01-20 DIAGNOSIS — I10 Essential (primary) hypertension: Secondary | ICD-10-CM | POA: Diagnosis not present

## 2021-01-20 DIAGNOSIS — I350 Nonrheumatic aortic (valve) stenosis: Secondary | ICD-10-CM | POA: Diagnosis not present

## 2021-01-20 DIAGNOSIS — E782 Mixed hyperlipidemia: Secondary | ICD-10-CM

## 2021-01-20 DIAGNOSIS — I480 Paroxysmal atrial fibrillation: Secondary | ICD-10-CM | POA: Diagnosis not present

## 2021-01-20 DIAGNOSIS — I2510 Atherosclerotic heart disease of native coronary artery without angina pectoris: Secondary | ICD-10-CM

## 2021-01-20 DIAGNOSIS — Z952 Presence of prosthetic heart valve: Secondary | ICD-10-CM

## 2021-01-20 NOTE — Progress Notes (Signed)
Cardiology Office Note:    Date:  01/20/2021   ID:  Ted Mcalpine, DOB 1928/02/05, MRN [id number]  PCP:  Lujean Amel, MD  Cardiologist:  Jenean Lindau, MD   Referring MD: Lujean Amel, MD    ASSESSMENT:    1. Aortic valve stenosis, etiology of cardiac valve disease unspecified   2. Atherosclerosis of native coronary artery of native heart without angina pectoris   3. Paroxysmal atrial fibrillation (HCC)   4. Coronary artery disease involving native coronary artery of native heart without angina pectoris   5. Essential hypertension   6. Mixed hyperlipidemia   7. S/P AVR and CABG    PLAN:    In order of problems listed above:  1. Advanced AV block: This happens predominantly at night hours.  Patient is only on beta-blockers in the morning now.  This is for elevated heart rate.  I reviewed electrophysiology colleagues note and concur on the decision.  I discussed this with the patient and her daughter extensively and then they are very happy with her electrophysiology visit.  I alerted them that if she has any symptoms of significant dizziness or syncope to get in touch with [place] ASAP or take her to the emergency room and he understands. 2. Cardiac stenosis: Stable at this time.  Symptoms of dizziness syncope shortness of breath on exertion discussed at length and the vocalized understanding. 3. Essential hypertension: Blood pressure stable and diet was emphasized. 4. Paroxysmal atrial fibrillation:I discussed with the patient atrial fibrillation, disease process. Management and therapy including rate and rhythm control, anticoagulation benefits and potential risks were discussed extensively with the patient. Patient had multiple questions which were answered to patient's satisfaction. 5. Patient will be seen in follow-up appointment in 6 months or earlier if the patient has any concerns    Medication Adjustments/Labs and Tests Ordered: Current medicines are reviewed at  length with the patient today.  Concerns regarding medicines are outlined above.  No orders of the defined types were placed in this encounter.  No orders of the defined types were placed in this encounter.    No chief complaint on file.    History of Present Illness:    NYAH SHEPHERD is a 87 y.o. female.  Patient has history of aortic stenosis, coronary artery disease, advanced heart block especially during sleeping time, essential hypertension and dyslipidemia.  She denies any problems at this time and takes care of activities of daily living.  No chest pain orthopnea or PND.  She appropriately is ambulatory.  She ambulates with a walker.  Her daughter accompanies her for this visit and is very supportive.  Past Medical History:  Diagnosis Date  . Acute blood loss anemia 05/16/2018  . AIHA (autoimmune hemolytic anemia) (HCC)   . Allergic rhinitis due to pollen 01/18/2021  . Anxiety disorder 01/18/2021  . Aortic stenosis   . Atherosclerotic heart disease of native coronary artery without angina pectoris 01/18/2021  . Atrial fibrillation ([place])    Noted during recent cardiac surgery and treated with amiodarone   . Autoimmune hemolytic anemia (HCC)   . AV heart block 11/23/2020  . CAD (coronary artery disease) 12/26/2011   Severe ostial LM disease noted at cath 12/26/11   . Chronic diastolic heart failure ([place]) 01/17/2012  . Complication of surgical procedure 01/18/2021  . Constipation 01/18/2021  . Coronary artery disease    Cath 1/13 left main  . 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 Dyspnea   .  Elevated bilirubin 05/14/2018  . Essential hypertension 01/18/2021  . Hypercoagulable state ([place]) 01/18/2021  . Hyperlipidemia   . Hypertension   . Hypertensive heart disease without CHF   . Hyponatremia 01/17/2012  . Hypothyroidism   . Long term (current) use of anticoagulants 07/14/2013  . Lumbar degenerative disc disease    Prior lumbar laminectomy 12/2009 Dr.  Rolena Infante.  Failed epidural steroids.  Currently on gabapentin   . Lumbar disc disease   . Lung nodules    Sees [facility], thought to be benign  . Memory problem 01/18/2021  . Mixed hyperlipidemia 01/18/2021  . Obesity   . Obstructive jaundice 05/14/2018  . Overactive bladder 01/18/2021  . PAF (paroxysmal atrial fibrillation) ([place]) 01/30/2019  . Paroxysmal atrial fibrillation ([place]) 01/30/2019  . Personal history of solitary pulmonary nodule 11/07/2011   Followed in Pulmonary clinic/ [facility]/ [facility]   - PET 11/19/11 indeterminant LUL nodule but slt reduced in size vs prev studies so rec f/u cxr in 2 months (tickle file) Wedge resection by Dr. Servando Snare at time of AVR/CABG 12/28/2011 >   wedge biopsy/resection, Left upper lobe - NON-NECROTIZING GRANULOMATOUS INFLAMMATION WITH ASSOCIATED MULTINUCLEATED GIANT CELLS.   . Polyp of colon 01/18/2021  . S/P AVR and CABG 12/28/2011   19 mm Magna Ease pericardial tissue valve CABG x 3 (LIMA-LAD, SVG-Int, SVG-dRCA)12/28/2011 Dr. Servando Snare    . Sciatica 01/18/2021  . Senile purpura ([place]) 01/18/2021  . Sepsis ([place]) 08/17/2018  . Spinal stenosis   . Villous adenoma of rectum     Past Surgical History:  Procedure Laterality Date  . AORTIC VALVE REPLACEMENT  12/28/2011   Procedure: AORTIC VALVE REPLACEMENT (AVR);  Surgeon: Grace Isaac, MD;  Location: [facility];  Service: Open Heart Surgery;  Laterality: N/A;  . CARDIAC CATHETERIZATION  2013  . CHOLECYSTECTOMY    . CORONARY ARTERY BYPASS GRAFT  12/28/2011   Procedure: CORONARY ARTERY BYPASS GRAFTING (CABG);  Surgeon: Grace Isaac, MD;  Location: [facility];  Service: Open Heart Surgery;  Laterality: N/A;  . HERNIA REPAIR    . LEFT AND RIGHT HEART CATHETERIZATION WITH CORONARY ANGIOGRAM N/A 12/26/2011   Procedure: LEFT AND RIGHT HEART CATHETERIZATION WITH CORONARY ANGIOGRAM;  Surgeon: Jacolyn Reedy, MD;  Location: [facility] CATH LAB;  Service: Cardiovascular;  Laterality: N/A;  . LUMBAR LAMINECTOMY    . PARTIAL COLECTOMY  2008    Villous adenoma    Current Medications: Current Meds  Medication Sig  . ALPRAZolam (XANAX) 0.25 MG tablet Take 0.25 mg by mouth at bedtime.  Marland Kitchen atorvastatin (LIPITOR) 20 MG tablet TAKE 1 TABLET BY MOUTH EVERYDAY AT BEDTIME  . BESIVANCE 0.6 % SUSP Place 1 drop into the left eye See admin instructions. Place 1 drop left eye 4 times daily for 2 days after Avastin eye injections  . Calcium Carbonate-Vitamin D (CALCIUM 600 + D PO) Take 1 tablet by mouth every morning.  . cetirizine (ZYRTEC) 10 MG tablet Take 10 mg by mouth daily in the afternoon.  . docusate sodium (COLACE) 100 MG capsule Take 100 mg by mouth at bedtime.   Marland Kitchen ELIQUIS 2.5 MG TABS tablet TAKE 1 TABLET BY MOUTH TWICE A DAY  . gabapentin (NEURONTIN) 100 MG capsule Take 100 mg by mouth 3 (three) times daily.   Marland Kitchen levothyroxine (SYNTHROID, LEVOTHROID) 88 MCG tablet Take 88 mcg by mouth daily before breakfast.   . losartan (COZAAR) 100 MG tablet TAKE 1 TABLET BY MOUTH EVERY DAY  . metoprolol tartrate (LOPRESSOR) 25 MG tablet  Take 1 tablet (25 mg total) by mouth in the morning.  . Multiple Vitamin (MULITIVITAMIN WITH MINERALS) TABS Take 1 tablet by mouth daily.  . Multiple Vitamins-Minerals (MULTI FOR HER 50+) TABS Take 1 tablet by mouth daily in the afternoon.  Marland Kitchen oxybutynin (DITROPAN) 5 MG tablet Take 5 mg by mouth every other day.  . polyethylene glycol (MIRALAX / GLYCOLAX) packet Take 17 g by mouth daily as needed for mild constipation.   . predniSONE (DELTASONE) 5 MG tablet Take 5 mg by mouth daily in the afternoon.  . Probiotic Product (PROBIOTIC PO) Take 1 capsule by mouth daily.  Marland Kitchen SANTYL ointment Apply 1 application topically daily.     Allergies:   Ibuprofen, Tapentadol, Ace inhibitors, Angiotensin receptor blockers, Dicloxacillin, Doxycycline, Hydrocodone, Nsaids, Adhesive [tape], and Latex   Social History   Socioeconomic History  . Marital status: Widowed    Spouse name: Not on file  . Number of children: 4  . Years  of education: 57  . Highest education level: 12th grade  Occupational History  . Occupation: retired Scientist, clinical (histocompatibility and immunogenetics)  Tobacco Use  . Smoking status: Former Smoker    Packs/day: 0.50    Years: 10.00    Pack years: 5.00    Types: Cigarettes    Quit date: 12/24/1978    Years since quitting: 42.1  . Smokeless tobacco: Never Used  Vaping Use  . Vaping Use: Never used  Substance and Sexual Activity  . Alcohol use: No  . Drug use: No  . Sexual activity: Not Currently    Birth control/protection: None  Other Topics Concern  . Not on file  Social History Narrative   Lives at [place]   Phone [phone]   [phone]   Caffeine use: Drinks coffee- 2 cups per day at the most per pt   Social Determinants of Health   Financial Resource Strain: Not on file  Food Insecurity: Not on file  Transportation Needs: Not on file  Physical Activity: Not on file  Stress: Not on file  Social Connections: Not on file     Family History: The patient's family history includes Breast cancer in her sister; Cancer in her mother and sister; Heart attack in her father; Heart disease in her father; Heart failure in her brother; Lymphoma in her sister.  ROS:   Please see the history of present illness.    All other systems reviewed and are negative.  EKGs/Labs/Other Studies Reviewed:    The following studies were reviewed today: I reviewed electrophysiology records.    Recent Labs: 11/04/2020: TSH 2.440 11/28/2020: ALT 14; BUN 21; Creatinine 0.93; Potassium 4.7; Sodium 133 12/27/2020: Hemoglobin 12.0; Platelet Count 187  Recent Lipid Panel No results found for: CHOL, TRIG, HDL, CHOLHDL, VLDL, LDLCALC, LDLDIRECT  Physical Exam:    VS:  BP (!) 144/86   Pulse 100   Ht 5\' 2"  (1.575 m)   Wt 147 lb 1.3 oz (66.7 kg)   SpO2 96%   BMI 26.90 kg/m     Wt Readings from Last 3 Encounters:  01/20/21 147 lb 1.3 oz (66.7 kg)  12/27/20 145 lb (65.8 kg)  11/30/20 147 lb 3.2 oz (66.8 kg)      GEN: Patient is in no acute distress HEENT: Normal NECK: No JVD; No carotid bruits LYMPHATICS: No lymphadenopathy CARDIAC: Hear sounds regular, 2/6 systolic murmur at the apex. RESPIRATORY:  Clear to auscultation without rales, wheezing or rhonchi  ABDOMEN: Soft, non-tender, non-distended MUSCULOSKELETAL:  No  edema; No deformity  SKIN: Warm and dry NEUROLOGIC:  Alert and oriented x 3 PSYCHIATRIC:  Normal affect   Signed, Jenean Lindau, MD  01/20/2021 2:47 PM    [place]

## 2021-01-20 NOTE — Patient Instructions (Signed)

## 2021-01-25 ENCOUNTER — Encounter (INDEPENDENT_AMBULATORY_CARE_PROVIDER_SITE_OTHER): Payer: Medicare Other | Admitting: Ophthalmology

## 2021-01-27 ENCOUNTER — Emergency Department (HOSPITAL_COMMUNITY): Payer: Medicare Other

## 2021-01-27 ENCOUNTER — Other Ambulatory Visit: Payer: Self-pay

## 2021-01-27 ENCOUNTER — Encounter (HOSPITAL_COMMUNITY): Payer: Self-pay | Admitting: Emergency Medicine

## 2021-01-27 ENCOUNTER — Inpatient Hospital Stay (HOSPITAL_COMMUNITY): Payer: Medicare Other

## 2021-01-27 ENCOUNTER — Inpatient Hospital Stay (HOSPITAL_COMMUNITY)
Admission: EM | Admit: 2021-01-27 | Discharge: 2021-02-06 | DRG: 064 | Disposition: A | Discharge status: Skilled Nursing Facility | Payer: Medicare Other | Source: Skilled Nursing Facility | Attending: Internal Medicine | Admitting: Internal Medicine

## 2021-01-27 DIAGNOSIS — Z951 Presence of aortocoronary bypass graft: Secondary | ICD-10-CM | POA: Diagnosis not present

## 2021-01-27 DIAGNOSIS — M5430 Sciatica, unspecified side: Secondary | ICD-10-CM | POA: Diagnosis not present

## 2021-01-27 DIAGNOSIS — R9431 Abnormal electrocardiogram [ECG] [EKG]: Secondary | ICD-10-CM | POA: Diagnosis not present

## 2021-01-27 DIAGNOSIS — R4781 Slurred speech: Secondary | ICD-10-CM | POA: Diagnosis present

## 2021-01-27 DIAGNOSIS — I491 Atrial premature depolarization: Secondary | ICD-10-CM | POA: Diagnosis not present

## 2021-01-27 DIAGNOSIS — Z9049 Acquired absence of other specified parts of digestive tract: Secondary | ICD-10-CM

## 2021-01-27 DIAGNOSIS — I700 Atherosclerosis of aorta: Secondary | ICD-10-CM | POA: Diagnosis not present

## 2021-01-27 DIAGNOSIS — Z79890 Hormone replacement therapy (postmenopausal): Secondary | ICD-10-CM | POA: Diagnosis not present

## 2021-01-27 DIAGNOSIS — Z7952 Long term (current) use of systemic steroids: Secondary | ICD-10-CM | POA: Diagnosis not present

## 2021-01-27 DIAGNOSIS — Z66 Do not resuscitate: Secondary | ICD-10-CM | POA: Diagnosis present

## 2021-01-27 DIAGNOSIS — R29898 Other symptoms and signs involving the musculoskeletal system: Secondary | ICD-10-CM | POA: Diagnosis not present

## 2021-01-27 DIAGNOSIS — I672 Cerebral atherosclerosis: Secondary | ICD-10-CM | POA: Diagnosis not present

## 2021-01-27 DIAGNOSIS — G319 Degenerative disease of nervous system, unspecified: Secondary | ICD-10-CM | POA: Diagnosis not present

## 2021-01-27 DIAGNOSIS — E860 Dehydration: Secondary | ICD-10-CM | POA: Diagnosis present

## 2021-01-27 DIAGNOSIS — Z7901 Long term (current) use of anticoagulants: Secondary | ICD-10-CM

## 2021-01-27 DIAGNOSIS — I4821 Permanent atrial fibrillation: Secondary | ICD-10-CM | POA: Diagnosis not present

## 2021-01-27 DIAGNOSIS — R5381 Other malaise: Secondary | ICD-10-CM | POA: Diagnosis present

## 2021-01-27 DIAGNOSIS — I10 Essential (primary) hypertension: Secondary | ICD-10-CM | POA: Diagnosis not present

## 2021-01-27 DIAGNOSIS — N390 Urinary tract infection, site not specified: Secondary | ICD-10-CM | POA: Diagnosis present

## 2021-01-27 DIAGNOSIS — Z20822 Contact with and (suspected) exposure to covid-19: Secondary | ICD-10-CM | POA: Diagnosis present

## 2021-01-27 DIAGNOSIS — I6523 Occlusion and stenosis of bilateral carotid arteries: Secondary | ICD-10-CM | POA: Diagnosis not present

## 2021-01-27 DIAGNOSIS — I110 Hypertensive heart disease with heart failure: Secondary | ICD-10-CM | POA: Diagnosis present

## 2021-01-27 DIAGNOSIS — Z87891 Personal history of nicotine dependence: Secondary | ICD-10-CM

## 2021-01-27 DIAGNOSIS — Z79899 Other long term (current) drug therapy: Secondary | ICD-10-CM

## 2021-01-27 DIAGNOSIS — E871 Hypo-osmolality and hyponatremia: Secondary | ICD-10-CM | POA: Diagnosis present

## 2021-01-27 DIAGNOSIS — E039 Hypothyroidism, unspecified: Secondary | ICD-10-CM | POA: Diagnosis present

## 2021-01-27 DIAGNOSIS — I639 Cerebral infarction, unspecified: Secondary | ICD-10-CM

## 2021-01-27 DIAGNOSIS — N3281 Overactive bladder: Secondary | ICD-10-CM | POA: Diagnosis not present

## 2021-01-27 DIAGNOSIS — I480 Paroxysmal atrial fibrillation: Secondary | ICD-10-CM | POA: Diagnosis present

## 2021-01-27 DIAGNOSIS — I471 Supraventricular tachycardia: Secondary | ICD-10-CM | POA: Diagnosis not present

## 2021-01-27 DIAGNOSIS — E782 Mixed hyperlipidemia: Secondary | ICD-10-CM | POA: Diagnosis present

## 2021-01-27 DIAGNOSIS — I4891 Unspecified atrial fibrillation: Secondary | ICD-10-CM | POA: Diagnosis present

## 2021-01-27 DIAGNOSIS — R531 Weakness: Secondary | ICD-10-CM | POA: Diagnosis not present

## 2021-01-27 DIAGNOSIS — I6503 Occlusion and stenosis of bilateral vertebral arteries: Secondary | ICD-10-CM | POA: Diagnosis not present

## 2021-01-27 DIAGNOSIS — R140 Abdominal distension (gaseous): Secondary | ICD-10-CM

## 2021-01-27 DIAGNOSIS — I5032 Chronic diastolic (congestive) heart failure: Secondary | ICD-10-CM | POA: Diagnosis present

## 2021-01-27 DIAGNOSIS — I6389 Other cerebral infarction: Secondary | ICD-10-CM | POA: Diagnosis not present

## 2021-01-27 DIAGNOSIS — R413 Other amnesia: Secondary | ICD-10-CM | POA: Diagnosis not present

## 2021-01-27 DIAGNOSIS — G9341 Metabolic encephalopathy: Secondary | ICD-10-CM | POA: Diagnosis present

## 2021-01-27 DIAGNOSIS — D589 Hereditary hemolytic anemia, unspecified: Secondary | ICD-10-CM | POA: Diagnosis present

## 2021-01-27 DIAGNOSIS — E785 Hyperlipidemia, unspecified: Secondary | ICD-10-CM | POA: Diagnosis present

## 2021-01-27 DIAGNOSIS — F419 Anxiety disorder, unspecified: Secondary | ICD-10-CM | POA: Diagnosis not present

## 2021-01-27 DIAGNOSIS — J301 Allergic rhinitis due to pollen: Secondary | ICD-10-CM | POA: Diagnosis not present

## 2021-01-27 DIAGNOSIS — K635 Polyp of colon: Secondary | ICD-10-CM | POA: Diagnosis not present

## 2021-01-27 DIAGNOSIS — I493 Ventricular premature depolarization: Secondary | ICD-10-CM | POA: Diagnosis not present

## 2021-01-27 DIAGNOSIS — R4182 Altered mental status, unspecified: Secondary | ICD-10-CM | POA: Diagnosis not present

## 2021-01-27 DIAGNOSIS — K5900 Constipation, unspecified: Secondary | ICD-10-CM | POA: Diagnosis not present

## 2021-01-27 DIAGNOSIS — I4430 Unspecified atrioventricular block: Secondary | ICD-10-CM | POA: Diagnosis not present

## 2021-01-27 DIAGNOSIS — J0120 Acute ethmoidal sinusitis, unspecified: Secondary | ICD-10-CM | POA: Diagnosis not present

## 2021-01-27 DIAGNOSIS — Z952 Presence of prosthetic heart valve: Secondary | ICD-10-CM

## 2021-01-27 DIAGNOSIS — K55011 Focal (segmental) acute (reversible) ischemia of small intestine: Secondary | ICD-10-CM | POA: Diagnosis not present

## 2021-01-27 DIAGNOSIS — R279 Unspecified lack of coordination: Secondary | ICD-10-CM | POA: Diagnosis not present

## 2021-01-27 DIAGNOSIS — I708 Atherosclerosis of other arteries: Secondary | ICD-10-CM | POA: Diagnosis not present

## 2021-01-27 DIAGNOSIS — I951 Orthostatic hypotension: Secondary | ICD-10-CM | POA: Diagnosis present

## 2021-01-27 DIAGNOSIS — Z9889 Other specified postprocedural states: Secondary | ICD-10-CM | POA: Diagnosis not present

## 2021-01-27 DIAGNOSIS — I2510 Atherosclerotic heart disease of native coronary artery without angina pectoris: Secondary | ICD-10-CM | POA: Diagnosis present

## 2021-01-27 DIAGNOSIS — R21 Rash and other nonspecific skin eruption: Secondary | ICD-10-CM | POA: Diagnosis not present

## 2021-01-27 DIAGNOSIS — G8191 Hemiplegia, unspecified affecting right dominant side: Secondary | ICD-10-CM | POA: Diagnosis present

## 2021-01-27 DIAGNOSIS — I6340 Cerebral infarction due to embolism of unspecified cerebral artery: Principal | ICD-10-CM | POA: Diagnosis present

## 2021-01-27 DIAGNOSIS — N3090 Cystitis, unspecified without hematuria: Secondary | ICD-10-CM | POA: Diagnosis not present

## 2021-01-27 DIAGNOSIS — Z743 Need for continuous supervision: Secondary | ICD-10-CM | POA: Diagnosis not present

## 2021-01-27 DIAGNOSIS — Z515 Encounter for palliative care: Secondary | ICD-10-CM | POA: Diagnosis not present

## 2021-01-27 DIAGNOSIS — K5641 Fecal impaction: Secondary | ICD-10-CM | POA: Diagnosis present

## 2021-01-27 DIAGNOSIS — B961 Klebsiella pneumoniae [K. pneumoniae] as the cause of diseases classified elsewhere: Secondary | ICD-10-CM | POA: Diagnosis present

## 2021-01-27 DIAGNOSIS — Z7189 Other specified counseling: Secondary | ICD-10-CM | POA: Diagnosis not present

## 2021-01-27 DIAGNOSIS — I6782 Cerebral ischemia: Secondary | ICD-10-CM | POA: Diagnosis not present

## 2021-01-27 DIAGNOSIS — D6859 Other primary thrombophilia: Secondary | ICD-10-CM | POA: Diagnosis not present

## 2021-01-27 LAB — COMPREHENSIVE METABOLIC PANEL
ALT: 17 U/L (ref 0–44)
AST: 22 U/L (ref 15–41)
Albumin: 3.8 g/dL (ref 3.5–5.0)
Alkaline Phosphatase: 54 U/L (ref 38–126)
Anion gap: 9 (ref 5–15)
BUN: 18 mg/dL (ref 8–23)
CO2: 26 mmol/L (ref 22–32)
Calcium: 9.3 mg/dL (ref 8.9–10.3)
Chloride: 97 mmol/L — ABNORMAL LOW (ref 98–111)
Creatinine, Ser: 1.04 mg/dL — ABNORMAL HIGH (ref 0.44–1.00)
GFR, Estimated: 51 mL/min — ABNORMAL LOW (ref >60)
Glucose, Bld: 115 mg/dL — ABNORMAL HIGH (ref 70–99)
Potassium: 4 mmol/L (ref 3.5–5.1)
Sodium: 132 mmol/L — ABNORMAL LOW (ref 135–145)
Total Bilirubin: 1 mg/dL (ref 0.3–1.2)
Total Protein: 6.5 g/dL (ref 6.5–8.1)

## 2021-01-27 LAB — DIFFERENTIAL
Abs Immature Granulocytes: 0.04 10*3/uL (ref 0.00–0.07)
Basophils Absolute: 0 10*3/uL (ref 0.0–0.1)
Basophils Relative: 0 %
Eosinophils Absolute: 0.1 10*3/uL (ref 0.0–0.5)
Eosinophils Relative: 1 %
Immature Granulocytes: 1 %
Lymphocytes Relative: 20 %
Lymphs Abs: 1.7 10*3/uL (ref 0.7–4.0)
Monocytes Absolute: 0.9 10*3/uL (ref 0.1–1.0)
Monocytes Relative: 11 %
Neutro Abs: 5.8 10*3/uL (ref 1.7–7.7)
Neutrophils Relative %: 67 %

## 2021-01-27 LAB — I-STAT CHEM 8, ED
BUN: 21 mg/dL (ref 8–23)
Calcium, Ion: 1.21 mmol/L (ref 1.15–1.40)
Chloride: 97 mmol/L — ABNORMAL LOW (ref 98–111)
Creatinine, Ser: 1 mg/dL (ref 0.44–1.00)
Glucose, Bld: 106 mg/dL — ABNORMAL HIGH (ref 70–99)
HCT: 39 % (ref 36.0–46.0)
Hemoglobin: 13.3 g/dL (ref 12.0–15.0)
Potassium: 4 mmol/L (ref 3.5–5.1)
Sodium: 134 mmol/L — ABNORMAL LOW (ref 135–145)
TCO2: 26 mmol/L (ref 22–32)

## 2021-01-27 LAB — URINALYSIS, ROUTINE W REFLEX MICROSCOPIC
Bilirubin Urine: NEGATIVE
Glucose, UA: NEGATIVE mg/dL
Hgb urine dipstick: NEGATIVE
Ketones, ur: NEGATIVE mg/dL
Nitrite: POSITIVE — AB
Protein, ur: NEGATIVE mg/dL
Specific Gravity, Urine: 1.005 (ref 1.005–1.030)
pH: 7 (ref 5.0–8.0)

## 2021-01-27 LAB — CBC
HCT: 38.6 % (ref 36.0–46.0)
Hemoglobin: 12.4 g/dL (ref 12.0–15.0)
MCH: 29 pg (ref 26.0–34.0)
MCHC: 32.1 g/dL (ref 30.0–36.0)
MCV: 90.2 fL (ref 80.0–100.0)
Platelets: 204 10*3/uL (ref 150–400)
RBC: 4.28 MIL/uL (ref 3.87–5.11)
RDW: 18.2 % — ABNORMAL HIGH (ref 11.5–15.5)
WBC: 8.5 10*3/uL (ref 4.0–10.5)
nRBC: 0 % (ref 0.0–0.2)

## 2021-01-27 LAB — PROTIME-INR
INR: 1.1 (ref 0.8–1.2)
Prothrombin Time: 13.7 seconds (ref 11.4–15.2)

## 2021-01-27 LAB — APTT: aPTT: 33 seconds (ref 24–36)

## 2021-01-27 LAB — CBG MONITORING, ED: Glucose-Capillary: 108 mg/dL — ABNORMAL HIGH (ref 70–99)

## 2021-01-27 MED ORDER — CEPHALEXIN 250 MG PO CAPS
500.0000 mg | ORAL_CAPSULE | Freq: Once | ORAL | Status: AC
  Administered 2021-01-27: 500 mg via ORAL
  Filled 2021-01-27: qty 2

## 2021-01-27 MED ORDER — STROKE: EARLY STAGES OF RECOVERY BOOK
Freq: Once | Status: AC
  Filled 2021-01-27 (×2): qty 1

## 2021-01-27 MED ORDER — LORATADINE 10 MG PO TABS
10.0000 mg | ORAL_TABLET | Freq: Every day | ORAL | Status: DC
  Administered 2021-01-27 – 2021-02-01 (×6): 10 mg via ORAL
  Filled 2021-01-27 (×6): qty 1

## 2021-01-27 MED ORDER — ACETAMINOPHEN 650 MG RE SUPP: 650.0000 mg | RECTAL | Status: DC | PRN

## 2021-01-27 MED ORDER — ACETAMINOPHEN 160 MG/5ML PO SOLN: 650.0000 mg | ORAL | Status: DC | PRN

## 2021-01-27 MED ORDER — SODIUM CHLORIDE 0.9 % IV SOLN: INTRAVENOUS | Status: DC

## 2021-01-27 MED ORDER — GABAPENTIN 100 MG PO CAPS
100.0000 mg | ORAL_CAPSULE | Freq: Three times a day (TID) | ORAL | Status: DC
  Administered 2021-01-27 – 2021-02-01 (×15): 100 mg via ORAL
  Filled 2021-01-27 (×15): qty 1

## 2021-01-27 MED ORDER — SODIUM CHLORIDE 0.9% FLUSH
3.0000 mL | Freq: Once | INTRAVENOUS | Status: AC
  Administered 2021-01-27: 3 mL via INTRAVENOUS

## 2021-01-27 MED ORDER — LEVOTHYROXINE SODIUM 88 MCG PO TABS
88.0000 ug | ORAL_TABLET | Freq: Every day | ORAL | Status: DC
  Administered 2021-01-28 – 2021-02-06 (×10): 88 ug via ORAL
  Filled 2021-01-27 (×10): qty 1

## 2021-01-27 MED ORDER — SODIUM CHLORIDE 0.9 % IV BOLUS
500.0000 mL | Freq: Once | INTRAVENOUS | Status: AC
  Administered 2021-01-27: 500 mL via INTRAVENOUS

## 2021-01-27 MED ORDER — ALPRAZOLAM 0.25 MG PO TABS
0.2500 mg | ORAL_TABLET | Freq: Every day | ORAL | Status: DC
  Administered 2021-01-27 – 2021-02-02 (×7): 0.25 mg via ORAL
  Filled 2021-01-27 (×7): qty 1

## 2021-01-27 MED ORDER — PREDNISONE 5 MG PO TABS
5.0000 mg | ORAL_TABLET | Freq: Every day | ORAL | Status: DC
  Administered 2021-01-28 – 2021-02-06 (×10): 5 mg via ORAL
  Filled 2021-01-27 (×11): qty 1

## 2021-01-27 MED ORDER — METOPROLOL TARTRATE 5 MG/5ML IV SOLN
2.5000 mg | INTRAVENOUS | Status: DC | PRN
  Administered 2021-01-27 – 2021-02-01 (×3): 2.5 mg via INTRAVENOUS
  Filled 2021-01-27 (×3): qty 5

## 2021-01-27 MED ORDER — ATORVASTATIN CALCIUM 10 MG PO TABS
20.0000 mg | ORAL_TABLET | Freq: Every day | ORAL | Status: DC
  Administered 2021-01-27 – 2021-02-05 (×10): 20 mg via ORAL
  Filled 2021-01-27 (×11): qty 2

## 2021-01-27 MED ORDER — APIXABAN 2.5 MG PO TABS
5.0000 mg | ORAL_TABLET | Freq: Two times a day (BID) | ORAL | Status: DC
  Filled 2021-01-27 (×2): qty 2

## 2021-01-27 MED ORDER — APIXABAN 2.5 MG PO TABS
2.5000 mg | ORAL_TABLET | Freq: Two times a day (BID) | ORAL | Status: DC
  Administered 2021-01-27 – 2021-01-28 (×3): 2.5 mg via ORAL
  Filled 2021-01-27 (×5): qty 1

## 2021-01-27 MED ORDER — ACETAMINOPHEN 325 MG PO TABS
650.0000 mg | ORAL_TABLET | ORAL | Status: DC | PRN
  Administered 2021-01-28 – 2021-01-29 (×2): 650 mg via ORAL
  Filled 2021-01-27 (×3): qty 2

## 2021-01-27 MED ORDER — OXYBUTYNIN CHLORIDE 5 MG PO TABS
5.0000 mg | ORAL_TABLET | ORAL | Status: DC
  Administered 2021-01-28 – 2021-02-05 (×5): 5 mg via ORAL
  Filled 2021-01-27 (×8): qty 1

## 2021-01-27 NOTE — ED Notes (Signed)
Pt to MRI

## 2021-01-27 NOTE — ED Notes (Signed)
Dress skin tares to both legs bilaterally.

## 2021-01-27 NOTE — ED Notes (Signed)
attempted report. RN needs to call me back

## 2021-01-27 NOTE — ED Triage Notes (Addendum)
Patient arrived with EMS from [facility] facility staff reported right side weakness , right leg weakness and slurred speech onset yesterday afternoon , CBG=136 , elevated blood pressure at nursing home 220/113.

## 2021-01-27 NOTE — ED Provider Notes (Signed)
[facility] EMERGENCY DEPARTMENT Provider Note   CSN: [id number] Arrival date & time: 01/27/21  3419     History Chief Complaint  Patient presents with  . Right Side Weakness/Slurred Speech    LSN : Yesterday Afternoon     Carol [name] is a 87 y.o. female.  Patient currently living at assisted living facility presents with mild slurred speech and right leg weakness that occurred sometime late last night.  Last known normal yesterday after dinner.  No history of stroke.  Patient has history of A. fib compliant with anticoagulant.  Currently no unilateral weakness however speech not at baseline per daughter.  No fevers or chills or shortness of breath.  No concerning headaches.  Patient does have hemolytic anemia has had intermittent rashes for which she is on 5 mg of prednisone currently.  Patient did take Benadryl last night 25 mg 1 dose after dinner.  No other new medications.        Past Medical History:  Diagnosis Date  . Acute blood loss anemia 05/16/2018  . AIHA (autoimmune hemolytic anemia) (HCC)   . Allergic rhinitis due to pollen 01/18/2021  . Anxiety disorder 01/18/2021  . Aortic stenosis   . Atherosclerotic heart disease of native coronary artery without angina pectoris 01/18/2021  . Atrial fibrillation ([place])    Noted during recent cardiac surgery and treated with amiodarone   . Autoimmune hemolytic anemia (HCC)   . AV heart block 11/23/2020  . CAD (coronary artery disease) 12/26/2011   Severe ostial LM disease noted at cath 12/26/11   . Chronic diastolic heart failure ([place]) 01/17/2012  . Complication of surgical procedure 01/18/2021  . Constipation 01/18/2021  . Coronary artery disease    Cath 1/13 left main  . 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 Dyspnea   . Elevated bilirubin 05/14/2018  . Essential hypertension 01/18/2021  . Hypercoagulable state ([place]) 01/18/2021  . Hyperlipidemia   . Hypertension   . Hypertensive heart  disease without CHF   . Hyponatremia 01/17/2012  . Hypothyroidism   . Long term (current) use of anticoagulants 07/14/2013  . Lumbar degenerative disc disease    Prior lumbar laminectomy 12/2009 Dr. Rolena Infante.  Failed epidural steroids.  Currently on gabapentin   . Lumbar disc disease   . Lung nodules    Sees [facility], thought to be benign  . Memory problem 01/18/2021  . Mixed hyperlipidemia 01/18/2021  . Obesity   . Obstructive jaundice 05/14/2018  . Overactive bladder 01/18/2021  . PAF (paroxysmal atrial fibrillation) ([place]) 01/30/2019  . Paroxysmal atrial fibrillation ([place]) 01/30/2019  . Personal history of solitary pulmonary nodule 11/07/2011   Followed in Pulmonary clinic/ [facility]/ [facility]   - PET 11/19/11 indeterminant LUL nodule but slt reduced in size vs prev studies so rec f/u cxr in 2 months (tickle file) Wedge resection by Dr. Servando Snare at time of AVR/CABG 12/28/2011 >   wedge biopsy/resection, Left upper lobe - NON-NECROTIZING GRANULOMATOUS INFLAMMATION WITH ASSOCIATED MULTINUCLEATED GIANT CELLS.   . Polyp of colon 01/18/2021  . S/P AVR and CABG 12/28/2011   19 mm Magna Ease pericardial tissue valve CABG x 3 (LIMA-LAD, SVG-Int, SVG-dRCA)12/28/2011 Dr. Servando Snare    . Sciatica 01/18/2021  . Senile purpura ([place]) 01/18/2021  . Sepsis ([place]) 08/17/2018  . Spinal stenosis   . Villous adenoma of rectum     Patient Active Problem List   Diagnosis Date Noted  . Allergic rhinitis due to pollen 01/18/2021  . Anxiety  disorder 01/18/2021  . Complication of surgical procedure 01/18/2021  . Constipation 01/18/2021  . Hypercoagulable state ([place]) 01/18/2021  . Memory problem 01/18/2021  . Overactive bladder 01/18/2021  . Polyp of colon 01/18/2021  . Sciatica 01/18/2021  . Senile purpura ([place]) 01/18/2021  . Atherosclerotic heart disease of native coronary artery without angina pectoris 01/18/2021  . Mixed hyperlipidemia 01/18/2021  . Essential hypertension 01/18/2021  . AV heart block 11/23/2020  .  Coronary artery disease   . Dyspnea   . Hypertension   . Lumbar disc disease   . Lung nodules   . Obesity   . Spinal stenosis   . Paroxysmal atrial fibrillation ([place]) 01/30/2019  . Sepsis ([place]) 08/17/2018  . Autoimmune hemolytic anemia (HCC)   . Acute blood loss anemia 05/14/2018  . Elevated bilirubin 05/14/2018  . Obstructive jaundice 05/14/2018  . 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 Long term (current) use of anticoagulants 07/14/2013  . Chronic diastolic heart failure ([place]) 01/17/2012  . Hyponatremia 01/17/2012  . Atrial fibrillation ([place])   . 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 CAD (coronary artery disease) 12/26/2011  . History of Villous adenoma of rectum   . Hypertensive heart disease without CHF   . Hyperlipidemia   . Hypothyroidism   . Lumbar degenerative disc disease   . Aortic stenosis 11/08/2011  . Personal history of solitary pulmonary nodule 11/07/2011    Past Surgical History:  Procedure Laterality Date  . AORTIC VALVE REPLACEMENT  12/28/2011   Procedure: AORTIC VALVE REPLACEMENT (AVR);  Surgeon: Grace Isaac, MD;  Location: [facility];  Service: Open Heart Surgery;  Laterality: N/A;  . CARDIAC CATHETERIZATION  2013  . CHOLECYSTECTOMY    . CORONARY ARTERY BYPASS GRAFT  12/28/2011   Procedure: CORONARY ARTERY BYPASS GRAFTING (CABG);  Surgeon: Grace Isaac, MD;  Location: [place];  Service: Open Heart Surgery;  Laterality: N/A;  . HERNIA REPAIR    . LEFT AND RIGHT HEART CATHETERIZATION WITH CORONARY ANGIOGRAM N/A 12/26/2011   Procedure: LEFT AND RIGHT HEART CATHETERIZATION WITH CORONARY ANGIOGRAM;  Surgeon: Jacolyn Reedy, MD;  Location: [facility] CATH LAB;  Service: Cardiovascular;  Laterality: N/A;  . LUMBAR LAMINECTOMY    . PARTIAL COLECTOMY  2008   Villous adenoma     OB History   No obstetric history on file.     Family History  Problem Relation Age of Onset  . Lymphoma Sister   . Cancer Sister   . Breast cancer Sister   . Cancer  Mother   . Heart attack Father   . Heart disease Father   . Heart failure Brother     Social History   Tobacco Use  . Smoking status: Former Smoker    Packs/day: 0.50    Years: 10.00    Pack years: 5.00    Types: Cigarettes    Quit date: 12/24/1978    Years since quitting: 42.1  . Smokeless tobacco: Never Used  Vaping Use  . Vaping Use: Never used  Substance Use Topics  . Alcohol use: No  . Drug use: No    Home Medications Prior to Admission medications   Medication Sig Start Date End Date Taking? Authorizing Provider  ALPRAZolam (XANAX) 0.25 MG tablet Take 0.25 mg by mouth at bedtime. 02/04/16  Yes [provider]  atorvastatin (LIPITOR) 20 MG tablet TAKE 1 TABLET BY MOUTH EVERYDAY AT BEDTIME Patient taking differently: Take 20 mg by mouth at bedtime. 12/06/20  Yes Revankar,  Reita Cliche, MD  BESIVANCE 0.6 % SUSP Place 1 drop into the left eye See admin instructions. Place 1 drop left eye 4 times daily for 2 days after Avastin eye injections 11/23/16  Yes [provider]  Calcium Carbonate-Vitamin D (CALCIUM 600 + D PO) Take 1 tablet by mouth every morning.   Yes [provider]  diphenhydrAMINE (BENADRYL) 25 mg capsule Take 25 mg by mouth 2 (two) times daily as needed (or allergic reactions).   Yes [provider]  docusate sodium (COLACE) 100 MG capsule Take 100 mg by mouth daily in the afternoon.   Yes [provider]  ELIQUIS 2.5 MG TABS tablet TAKE 1 TABLET BY MOUTH TWICE A DAY Patient taking differently: Take 2.5 mg by mouth in the morning and at bedtime. 03/02/20  Yes Revankar, Reita Cliche, MD  gabapentin (NEURONTIN) 100 MG capsule Take 100 mg by mouth 3 (three) times daily.  06/24/15  Yes [provider]  levothyroxine (SYNTHROID, LEVOTHROID) 88 MCG tablet Take 88 mcg by mouth daily before breakfast.  06/28/15  Yes [provider]  losartan (COZAAR) 100 MG tablet TAKE 1 TABLET BY MOUTH EVERY DAY Patient taking differently:  Take 100 mg by mouth in the morning. 11/11/20  Yes Revankar, Reita Cliche, MD  metoprolol tartrate (LOPRESSOR) 25 MG tablet Take 1 tablet (25 mg total) by mouth in the morning. 11/23/20  Yes Revankar, Reita Cliche, MD  Multiple Vitamins-Minerals (MULTI FOR HER 50+) TABS Take 1 tablet by mouth daily in the afternoon.   Yes [provider]  oxybutynin (DITROPAN) 5 MG tablet Take 5 mg by mouth every other day.   Yes [provider]  polyethylene glycol (MIRALAX / GLYCOLAX) packet Take 17 g by mouth daily as needed for mild constipation ([place]).   Yes [provider]  predniSONE (DELTASONE) 5 MG tablet Take 5 mg by mouth daily with breakfast.   Yes [provider]  Probiotic Product (PROBIOTIC PO) Take 1 capsule by mouth daily.   Yes [provider]  SANTYL ointment Apply 1 application topically See admin instructions. Apply daily as directed to the affected toe 01/11/21  Yes [provider]  cetirizine (ZYRTEC) 10 MG tablet Take 10 mg by mouth daily in the afternoon.    [provider]    Allergies    Ibuprofen, Tapentadol, Ace inhibitors, Angiotensin receptor blockers, Dicloxacillin, Doxycycline, Hydrocodone, Nsaids, Adhesive [tape], and Latex  Review of Systems   Review of Systems  Constitutional: Negative for chills and fever.  HENT: Negative for congestion.   Eyes: Negative for visual disturbance.  Respiratory: Negative for shortness of breath.   Cardiovascular: Negative for chest pain.  Gastrointestinal: Negative for abdominal pain and vomiting.  Genitourinary: Negative for dysuria and flank pain.  Musculoskeletal: Negative for back pain, neck pain and neck stiffness.  Skin: Positive for rash.  Neurological: Positive for speech difficulty and weakness. Negative for light-headedness and headaches.    Physical Exam Updated Vital Signs BP (!) 181/100   Pulse 93   Temp (!) 97.4 F (36.3 C) (Oral)   Resp 20   SpO2 97%    Physical Exam Vitals and nursing note reviewed.  Constitutional:      Appearance: She is well-developed and well-nourished. She is not ill-appearing.  HENT:     Head: Normocephalic and atraumatic.     Mouth/Throat:     Mouth: Mucous membranes are dry.  Eyes:     General:  Right eye: No discharge.        Left eye: No discharge.     Conjunctiva/sclera: Conjunctivae normal.  Neck:     Trachea: No tracheal deviation.  Cardiovascular:     Rate and Rhythm: Normal rate. Rhythm irregular.  Pulmonary:     Effort: Pulmonary effort is normal.     Breath sounds: Normal breath sounds.  Abdominal:     General: There is no distension.     Palpations: Abdomen is soft.     Tenderness: There is no abdominal tenderness. There is no guarding.  Musculoskeletal:        General: No edema.     Cervical back: Normal range of motion and neck supple.  Skin:    General: Skin is warm.     Capillary Refill: Capillary refill takes less than 2 seconds.     Findings: No rash.     Comments: Patient has multiple areas of ecchymosis and a few superficial skin tears lower extremities bilateral.  Patient has mild erythema and mild excoriation upper anterior chest and shoulder region, no significant warmth or induration.  Neurological:     General: No focal deficit present.     Mental Status: She is alert and oriented to person, place, and time.     Cranial Nerves: No cranial nerve deficit.     Sensory: No sensory deficit.     Motor: No weakness.     Coordination: Coordination normal.  Psychiatric:        Mood and Affect: Mood and affect normal.     ED Results / Procedures / Treatments   Labs (all labs ordered are listed, but only abnormal results are displayed) Labs Reviewed  CBC - Abnormal; Notable for the following components:      Result Value   RDW 18.2 (*)    All other components within normal limits  COMPREHENSIVE METABOLIC PANEL - Abnormal; Notable for the following components:    Sodium 132 (*)    Chloride 97 (*)    Glucose, Bld 115 (*)    Creatinine, Ser 1.04 (*)    GFR, Estimated 51 (*)    All other components within normal limits  URINALYSIS, ROUTINE W REFLEX MICROSCOPIC - Abnormal; Notable for the following components:   Nitrite POSITIVE (*)    Leukocytes,Ua LARGE (*)    Bacteria, UA RARE (*)    All other components within normal limits  I-STAT CHEM 8, ED - Abnormal; Notable for the following components:   Sodium 134 (*)    Chloride 97 (*)    Glucose, Bld 106 (*)    All other components within normal limits  CBG MONITORING, ED - Abnormal; Notable for the following components:   Glucose-Capillary 108 (*)    All other components within normal limits  URINE CULTURE  SARS CORONAVIRUS 2 BY RT PCR (HOSPITAL ORDER, [facility])  PROTIME-INR  APTT  DIFFERENTIAL    EKG EKG Interpretation  Date/Time:  Friday January 27 2021 06:38:43 EST Ventricular Rate:  114 PR Interval:  196 QRS Duration: 78 QT Interval:  332 QTC Calculation: 457 R Axis:   52 Text Interpretation: Sinus tachycardia with occasional Premature ventricular complexes Anteroseptal infarct , age undetermined Abnormal ECG Confirmed by Elnora Morrison [phone]) on 01/27/2021 8:35:18 AM   Radiology CT HEAD WO CONTRAST  Result Date: 01/27/2021 CLINICAL DATA:  Right-sided weakness with slurred speech EXAM: CT HEAD WITHOUT CONTRAST TECHNIQUE: Contiguous axial images were obtained from the base of  the skull through the vertex without intravenous contrast. COMPARISON:  02/19/2017 FINDINGS: Brain: No evidence of acute gray matter infarction, hemorrhage, hydrocephalus, extra-axial collection or mass lesion/mass effect. Confluent chronic small vessel ischemia in the deep cerebral white matter. Increased number of chronic appearing bilateral cerebellar infarcts. Age congruent brain volume Vascular: No hyperdense vessel or unexpected calcification. Skull: Normal. Negative for fracture or  focal lesion. Sinuses/Orbits: Bilateral cataract resection. IMPRESSION: 1. No acute finding. 2. Extensive chronic small vessel disease. Electronically Signed   By: Monte Fantasia M.D.   On: 01/27/2021 07:23   MR BRAIN WO CONTRAST  Addendum Date: 01/27/2021   ADDENDUM REPORT: 01/27/2021 12:00 ADDENDUM: These results were called by telephone at the time of interpretation on 01/27/2021 at 12:00 pm to provider [facility] , who verbally acknowledged these results. Electronically Signed   By: Pedro Earls M.D.   On: 01/27/2021 12:00   Result Date: 01/27/2021 CLINICAL DATA:  Stroke suspected. EXAM: MRI HEAD WITHOUT CONTRAST TECHNIQUE: Multiplanar, multiecho pulse sequences of the brain and surrounding structures were obtained without intravenous contrast. COMPARISON:  Head CT January 27, 2021. FINDINGS: Brain: Areas of restricted diffusion within the left cerebellar hemisphere and left caudate body extending into the lenticulocapsular region, consistent with acute infarct. No hemorrhage, hydrocephalus, extra-axial collection or mass lesion. Scattered and confluent foci of T2 hyperintensity are seen within the white matter of the cerebral hemispheres, nonspecific, most likely related to chronic microvascular ischemic changes. Remote lacunar infarcts in the bilateral cerebellar hemispheres. Vascular: Normal flow voids. Skull and upper cervical spine: Normal marrow signal. Sinuses/Orbits: Bilateral lens surgery. Paranasal sinuses are clear. IMPRESSION: 1. Areas of restricted diffusion within the left cerebellar hemisphere and left caudate body extending into the lenticulocapsular region, consistent with acute infarcts. 2. Remote lacunar infarcts in the bilateral cerebellar hemispheres. 3. Moderate chronic microvascular ischemic changes. Electronically Signed: By: Pedro Earls M.D. On: 01/27/2021 11:54    Procedures .Critical Care Performed by: Elnora Morrison, MD Authorized by:  Elnora Morrison, MD   Critical care provider statement:    Critical care time (minutes):  40   Critical care start time:  01/27/2021 11:20 PM   Critical care end time:  01/27/2021 12:00 PM   Critical care time was exclusive of:  Separately billable procedures and treating other patients and teaching time   Critical care was necessary to treat or prevent imminent or life-threatening deterioration of the following conditions:  CNS failure or compromise   Critical care was time spent personally by me on the following activities:  Discussions with consultants, evaluation of patient's response to treatment, examination of patient, ordering and performing treatments and interventions, ordering and review of laboratory studies, ordering and review of radiographic studies, pulse oximetry, re-evaluation of patient's condition, obtaining history from patient or surrogate and review of old charts     Medications Ordered in ED Medications  sodium chloride flush (NS) 0.9 % injection 3 mL (3 mLs Intravenous Given 01/27/21 0843)  sodium chloride 0.9 % bolus 500 mL (0 mLs Intravenous Stopped 01/27/21 0948)  cephALEXin (KEFLEX) capsule 500 mg (500 mg Oral Given 01/27/21 1214)    ED Course  I have reviewed the triage vital signs and the nursing notes.  Pertinent labs & imaging results that were available during my care of the patient were reviewed by me and considered in my medical decision making (see chart for details).    MDM Rules/Calculators/A&P  Patient presents with mild slurred speech and mild right leg weakness last known normal yesterday evening.  Currently no focal neuro deficits, patient very close to baseline except for fatigue appearance.  Discussed differential diagnosis including Benadryl/medication induced, metabolic blood work reviewed showing mild hyponatremia 133 which unlikely is the cause, dehydration with patient not drinking regularly, infectious, stroke with patient on  anticoagulant/transient right-sided weakness.  Blood work reviewed no signs of significant anemia, potassium normal, kidney function minimally elevated plan for small amount of fluids.  Urinalysis reviewed showing concern for infection with positive nitrite and positive leukocytes.  Glucose normal.  CT scan of the head no acute bleeding or big stroke.  Treatment of rash would include increasing her prednisone for for 5 days to 10 mg and outpatient follow-up.  Discussed would need MRI to look for occult stroke however patient has history of valve replacement so will discuss with MRI staff.  Discussed this may not change management as patient is compliant with anticoagulant however would be helpful to know as the cause.  The cause may be combination of age/dehydration/urine infection/Benadryl.  Discussed with radiology showing 2 acute strokes 1 in the basal ganglia region.  Paged neurology for consult.  Discussed with our hospitalist for admission.  Updated family on plan of care.   Final Clinical Impression(s) / ED Diagnoses Final diagnoses:  Right leg weakness  Urinary tract infection without hematuria, site unspecified  Rash and nonspecific skin eruption  Acute stroke due to ischemia [facility])    Rx / DC Orders ED Discharge Orders    None       Elnora Morrison, MD 01/27/21 1232

## 2021-01-27 NOTE — ED Notes (Signed)
DaughterMateo Flow [phone]

## 2021-01-27 NOTE — Plan of Care (Signed)

## 2021-01-27 NOTE — Progress Notes (Signed)
Carotid duplex bilateral study completed.   Please see CV Proc for preliminary results.   Braylynn Ghan, RDMS  

## 2021-01-27 NOTE — ED Notes (Signed)
Pt lives at assisted living, last night the nurses called daughter stating the pt couldn't walk well. Daughter got there around 1115pm and noticed the pt couldn't walk that well. Daughter states the pt was sleeping when she first got to see her. Around 12am she noticed the pt had slurred speech but she was not sure if it was from the benadryl she had taken. Pt is normally able to walk with walker however she was dragging right leg.

## 2021-01-27 NOTE — ED Notes (Signed)
RN Jarrett Soho notified of pts bp

## 2021-01-27 NOTE — H&P (Signed)
History and Physical    Carol [name] OMV:[id number] DOB: 1928/05/18 DOA: 01/27/2021  PCP: Lujean Amel, MD    Patient coming from:  Assisted living facility   Chief Complaint:  Right leg weakness/slurred speech   HPI: Carol [name] is a 87 y.o. female presenting to the emergency room complaints of right leg weakness and slurred speech that started yesterday afternoon sometime.  Last known normal was yesterday after dinner.  Patient has past medical history of acute blood loss anemia, autoimmune hemolytic anemia, atrial fibrillation on anticoagulation, heart disease, chronic diastolic heart failure, essential hypertension, hypothyroidism.  ED Course:  Vitals:   01/27/21 1030 01/27/21 1200 01/27/21 1215 01/27/21 1645  BP: 130/60 (!) 176/78 (!) 181/100 (!) 204/92  Pulse: (!) 110 (!) 102 93 (!) 129  Resp: '15 16 20 ' (!) 25  Temp:      TempSrc:      SpO2: 96% 96% 97% 93%  In the emergency room patient is hypertensive as above and oxygenating wnl on room air. Labs today show an elevated RDW of 18.2, sodium mildly low at 132, glucose of 115, serum creatinine of 1.04, EGFR 51, UA showed nitrite positive large leukocytes, patient has a history of E. coli UTI in 2019.  CT of the head without contrast showed extensive small vessel disease, no acute findings, MRI of the brain without contrast today shows left cerebellar acute infarcts.  Covid test is pending.  Urine culture has been collected in the emergency room.   Review of Systems:  Review of Systems  Skin: Positive for rash.  Neurological: Positive for speech change and weakness.  All other systems reviewed and are negative.    Past Medical History:  Diagnosis Date  . Acute blood loss anemia 05/16/2018  . AIHA (autoimmune hemolytic anemia) (HCC)   . Allergic rhinitis due to pollen 01/18/2021  . Anxiety disorder 01/18/2021  . Aortic stenosis   . Atherosclerotic heart disease of native coronary artery without angina  pectoris 01/18/2021  . Atrial fibrillation ([place])    Noted during recent cardiac surgery and treated with amiodarone   . Autoimmune hemolytic anemia (HCC)   . AV heart block 11/23/2020  . CAD (coronary artery disease) 12/26/2011   Severe ostial LM disease noted at cath 12/26/11   . Chronic diastolic heart failure ([place]) 01/17/2012  . Complication of surgical procedure 01/18/2021  . Constipation 01/18/2021  . Coronary artery disease    Cath 1/13 left main  . 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 Dyspnea   . Elevated bilirubin 05/14/2018  . Essential hypertension 01/18/2021  . Hypercoagulable state ([place]) 01/18/2021  . Hyperlipidemia   . Hypertension   . Hypertensive heart disease without CHF   . Hyponatremia 01/17/2012  . Hypothyroidism   . Long term (current) use of anticoagulants 07/14/2013  . Lumbar degenerative disc disease    Prior lumbar laminectomy 12/2009 Dr. Rolena Infante.  Failed epidural steroids.  Currently on gabapentin   . Lumbar disc disease   . Lung nodules    Sees [facility], thought to be benign  . Memory problem 01/18/2021  . Mixed hyperlipidemia 01/18/2021  . Obesity   . Obstructive jaundice 05/14/2018  . Overactive bladder 01/18/2021  . PAF (paroxysmal atrial fibrillation) ([place]) 01/30/2019  . Paroxysmal atrial fibrillation ([place]) 01/30/2019  . Personal history of solitary pulmonary nodule 11/07/2011   Followed in Pulmonary clinic/ [facility]/ [facility]   - PET 11/19/11 indeterminant LUL nodule but slt reduced in size vs prev studies  so rec f/u cxr in 2 months (tickle file) Wedge resection by Dr. Servando Snare at time of AVR/CABG 12/28/2011 >   wedge biopsy/resection, Left upper lobe - NON-NECROTIZING GRANULOMATOUS INFLAMMATION WITH ASSOCIATED MULTINUCLEATED GIANT CELLS.   . Polyp of colon 01/18/2021  . S/P AVR and CABG 12/28/2011   19 mm Magna Ease pericardial tissue valve CABG x 3 (LIMA-LAD, SVG-Int, SVG-dRCA)12/28/2011 Dr. Servando Snare    . Sciatica 01/18/2021  . Senile purpura ([place])  01/18/2021  . Sepsis ([place]) 08/17/2018  . Spinal stenosis   . Villous adenoma of rectum     Past Surgical History:  Procedure Laterality Date  . AORTIC VALVE REPLACEMENT  12/28/2011   Procedure: AORTIC VALVE REPLACEMENT (AVR);  Surgeon: Grace Isaac, MD;  Location: [facility];  Service: Open Heart Surgery;  Laterality: N/A;  . CARDIAC CATHETERIZATION  2013  . CHOLECYSTECTOMY    . CORONARY ARTERY BYPASS GRAFT  12/28/2011   Procedure: CORONARY ARTERY BYPASS GRAFTING (CABG);  Surgeon: Grace Isaac, MD;  Location: [facility];  Service: Open Heart Surgery;  Laterality: N/A;  . HERNIA REPAIR    . LEFT AND RIGHT HEART CATHETERIZATION WITH CORONARY ANGIOGRAM N/A 12/26/2011   Procedure: LEFT AND RIGHT HEART CATHETERIZATION WITH CORONARY ANGIOGRAM;  Surgeon: Jacolyn Reedy, MD;  Location: [facility] CATH LAB;  Service: Cardiovascular;  Laterality: N/A;  . LUMBAR LAMINECTOMY    . PARTIAL COLECTOMY  2008   Villous adenoma     reports that she quit smoking about 42 years ago. Her smoking use included cigarettes. She has a 5.00 pack-year smoking history. She has never used smokeless tobacco. She reports that she does not drink alcohol and does not use drugs.  Allergies  Allergen Reactions  . Ibuprofen Other (See Comments)    Hx of GI bleed  . Tapentadol Other (See Comments)    Nucynta- Knows she "cannot take"- made the patient "very sick"   . Ace Inhibitors Cough  . Angiotensin Receptor Blockers Cough  . Dicloxacillin Nausea Only and Other (See Comments)    Made the patient lightheaded, also  . Doxycycline Nausea Only  . Hydrocodone Nausea Only and Other (See Comments)    Made the patient lightheaded, also  . Nsaids     Can tolerate only Tylenol (has a history of GI BLEEDS)  . Adhesive [Tape] Rash    Blisters (can use only paper tape)  . Latex Rash and Other (See Comments)    Paper tape only    Family History  Problem Relation Age of Onset  . Lymphoma Sister   . Cancer Sister   . Breast cancer  Sister   . Cancer Mother   . Heart attack Father   . Heart disease Father   . Heart failure Brother     Prior to Admission medications   Medication Sig Start Date End Date Taking? Authorizing Provider  ALPRAZolam (XANAX) 0.25 MG tablet Take 0.25 mg by mouth at bedtime. 02/04/16  Yes [provider]  atorvastatin (LIPITOR) 20 MG tablet TAKE 1 TABLET BY MOUTH EVERYDAY AT BEDTIME Patient taking differently: Take 20 mg by mouth at bedtime. 12/06/20  Yes Revankar, Reita Cliche, MD  BESIVANCE 0.6 % SUSP Place 1 drop into the left eye See admin instructions. Place 1 drop left eye 4 times daily for 2 days after Avastin eye injections 11/23/16  Yes [provider]  Calcium Carbonate-Vitamin D (CALCIUM 600 + D PO) Take 1 tablet by mouth every morning.   Yes [provider]  diphenhydrAMINE (BENADRYL) 25 mg capsule Take 25 mg by mouth 2 (two) times daily as needed (or allergic reactions).   Yes [provider]  docusate sodium (COLACE) 100 MG capsule Take 100 mg by mouth daily in the afternoon.   Yes [provider]  ELIQUIS 2.5 MG TABS tablet TAKE 1 TABLET BY MOUTH TWICE A DAY Patient taking differently: Take 2.5 mg by mouth in the morning and at bedtime. 03/02/20  Yes Revankar, Reita Cliche, MD  gabapentin (NEURONTIN) 100 MG capsule Take 100 mg by mouth 3 (three) times daily.  06/24/15  Yes [provider]  levothyroxine (SYNTHROID, LEVOTHROID) 88 MCG tablet Take 88 mcg by mouth daily before breakfast.  06/28/15  Yes [provider]  losartan (COZAAR) 100 MG tablet TAKE 1 TABLET BY MOUTH EVERY DAY Patient taking differently: Take 100 mg by mouth in the morning. 11/11/20  Yes Revankar, Reita Cliche, MD  metoprolol tartrate (LOPRESSOR) 25 MG tablet Take 1 tablet (25 mg total) by mouth in the morning. 11/23/20  Yes Revankar, Reita Cliche, MD  Multiple Vitamins-Minerals (MULTI FOR HER 50+) TABS Take 1 tablet by mouth daily in the afternoon.   Yes [provider]   oxybutynin (DITROPAN) 5 MG tablet Take 5 mg by mouth every other day.   Yes [provider]  polyethylene glycol (MIRALAX / GLYCOLAX) packet Take 17 g by mouth daily as needed for mild constipation ([place]).   Yes [provider]  predniSONE (DELTASONE) 5 MG tablet Take 5 mg by mouth daily with breakfast.   Yes [provider]  Probiotic Product (PROBIOTIC PO) Take 1 capsule by mouth daily.   Yes [provider]  SANTYL ointment Apply 1 application topically See admin instructions. Apply daily as directed to the affected toe 01/11/21  Yes [provider]  cetirizine (ZYRTEC) 10 MG tablet Take 10 mg by mouth daily in the afternoon.    [provider]    Physical Exam: Vitals:   01/27/21 1030 01/27/21 1200 01/27/21 1215 01/27/21 1645  BP: 130/60 (!) 176/78 (!) 181/100 (!) 204/92  Pulse: (!) 110 (!) 102 93 (!) 129  Resp: '15 16 20 ' (!) 25  Temp:      TempSrc:      SpO2: 96% 96% 97% 93%    Physical Exam Vitals and nursing note reviewed.  Constitutional:      Appearance: She is ill-appearing.  HENT:     Head: Normocephalic and atraumatic.     Right Ear: External ear normal.     Left Ear: External ear normal.  Eyes:     Extraocular Movements: Extraocular movements intact.  Neck:     Vascular: No carotid bruit.  Cardiovascular:     Rate and Rhythm: Normal rate. Rhythm irregular.     Pulses: Normal pulses.     Heart sounds: Normal heart sounds.  Pulmonary:     Effort: Pulmonary effort is normal.     Breath sounds: Normal breath sounds.  Abdominal:     General: Bowel sounds are normal. There is no distension.     Palpations: Abdomen is soft.     Tenderness: There is no abdominal tenderness. There is no guarding.  Musculoskeletal:     Right lower leg: No edema.     Left lower leg: No edema.  Skin:    General: Skin is warm.     Findings: Rash present. No lesion.  Neurological:     General: No focal deficit present.  Mental Status: She is alert and oriented to person, place, and time.      Labs on Admission: I have personally reviewed following labs and imaging studies Labs  No results for input(s): CKTOTAL, CKMB, TROPONINI in the last 72 hours. Lab Results  Component Value Date   WBC 8.5 01/27/2021   HGB 13.3 01/27/2021   HCT 39.0 01/27/2021   MCV 90.2 01/27/2021   PLT 204 01/27/2021    Recent Labs  Lab 01/27/21 0643 01/27/21 0647  NA 132* 134*  K 4.0 4.0  CL 97* 97*  CO2 26  --   BUN 18 21  CREATININE 1.04* 1.00  CALCIUM 9.3  --   PROT 6.5  --   BILITOT 1.0  --   ALKPHOS 54  --   ALT 17  --   AST 22  --   GLUCOSE 115* 106*   No results found for: CHOL, HDL, LDLCALC, TRIG No results found for: DDIMER Invalid input(s): POCBNP  Urinalysis    Component Value Date/Time   COLORURINE YELLOW 01/27/2021 0900   APPEARANCEUR CLEAR 01/27/2021 0900   LABSPEC 1.005 01/27/2021 0900   PHURINE 7.0 01/27/2021 0900   GLUCOSEU NEGATIVE 01/27/2021 0900   HGBUR NEGATIVE 01/27/2021 0900   BILIRUBINUR NEGATIVE 01/27/2021 0900   KETONESUR NEGATIVE 01/27/2021 0900   PROTEINUR NEGATIVE 01/27/2021 0900   UROBILINOGEN 0.2 12/27/2011 2237   NITRITE POSITIVE (A) 01/27/2021 0900   LEUKOCYTESUR LARGE (A) 01/27/2021 0900    COVID-19 Labs  No results for input(s): DDIMER, FERRITIN, LDH, CRP in the last 72 hours.  No results found for: [place]  Radiological Exams on Admission: CT HEAD WO CONTRAST  Result Date: 01/27/2021 CLINICAL DATA:  Right-sided weakness with slurred speech EXAM: CT HEAD WITHOUT CONTRAST TECHNIQUE: Contiguous axial images were obtained from the base of the skull through the vertex without intravenous contrast. COMPARISON:  02/19/2017 FINDINGS: Brain: No evidence of acute gray matter infarction, hemorrhage, hydrocephalus, extra-axial collection or mass lesion/mass effect. Confluent chronic small vessel ischemia in the deep cerebral white matter. Increased number of chronic  appearing bilateral cerebellar infarcts. Age congruent brain volume Vascular: No hyperdense vessel or unexpected calcification. Skull: Normal. Negative for fracture or focal lesion. Sinuses/Orbits: Bilateral cataract resection. IMPRESSION: 1. No acute finding. 2. Extensive chronic small vessel disease. Electronically Signed   By: Monte Fantasia M.D.   On: 01/27/2021 07:23   MR BRAIN WO CONTRAST  Addendum Date: 01/27/2021   ADDENDUM REPORT: 01/27/2021 12:00 ADDENDUM: These results were called by telephone at the time of interpretation on 01/27/2021 at 12:00 pm to provider [facility] , who verbally acknowledged these results. Electronically Signed   By: Pedro Earls M.D.   On: 01/27/2021 12:00   Result Date: 01/27/2021 CLINICAL DATA:  Stroke suspected. EXAM: MRI HEAD WITHOUT CONTRAST TECHNIQUE: Multiplanar, multiecho pulse sequences of the brain and surrounding structures were obtained without intravenous contrast. COMPARISON:  Head CT January 27, 2021. FINDINGS: Brain: Areas of restricted diffusion within the left cerebellar hemisphere and left caudate body extending into the lenticulocapsular region, consistent with acute infarct. No hemorrhage, hydrocephalus, extra-axial collection or mass lesion. Scattered and confluent foci of T2 hyperintensity are seen within the white matter of the cerebral hemispheres, nonspecific, most likely related to chronic microvascular ischemic changes. Remote lacunar infarcts in the bilateral cerebellar hemispheres. Vascular: Normal flow voids. Skull and upper cervical spine: Normal marrow signal. Sinuses/Orbits: Bilateral lens surgery. Paranasal sinuses are clear. IMPRESSION: 1. Areas of restricted diffusion within the left  cerebellar hemisphere and left caudate body extending into the lenticulocapsular region, consistent with acute infarcts. 2. Remote lacunar infarcts in the bilateral cerebellar hemispheres. 3. Moderate chronic microvascular ischemic changes.  Electronically Signed: By: Pedro Earls M.D. On: 01/27/2021 11:54   VAS US CAROTID (at [facility] and [facility] only)  Result Date: 01/27/2021 Carotid Arterial Duplex Study Indications:       CVA. Risk Factors:      Hypertension, hyperlipidemia, coronary artery disease. Comparison Study:  01-22-2017 Prior carotid artery duplex study showed 1-39%                    stenosis bilaterally. Performing Technologist: Darlin Coco RDMS  Examination Guidelines: A complete evaluation includes B-mode imaging, spectral Doppler, color Doppler, and power Doppler as needed of all accessible portions of each vessel. Bilateral testing is considered an integral part of a complete examination. Limited examinations for reoccurring indications may be performed as noted.  Right Carotid Findings: +----------+--------+--------+--------+------------------+---------------------+           PSV cm/sEDV cm/sStenosisPlaque DescriptionComments              +----------+--------+--------+--------+------------------+---------------------+ CCA Prox  89      16                                                      +----------+--------+--------+--------+------------------+---------------------+ CCA Distal69      16              calcific                                +----------+--------+--------+--------+------------------+---------------------+ ICA Prox  131     31      1-39%   calcific          Velocities may                                                            underestimate degree                                                      of stenosis due to                                                        more proximal                                                             obstruction.          +----------+--------+--------+--------+------------------+---------------------+ ICA Mid   133     25                                                       +----------+--------+--------+--------+------------------+---------------------+  ICA Distal104     23                                                      +----------+--------+--------+--------+------------------+---------------------+ ECA       246             >50%    calcific                                +----------+--------+--------+--------+------------------+---------------------+ +----------+--------+-------+----------------+-------------------+           PSV cm/sEDV cmsDescribe        Arm Pressure (mmHG) +----------+--------+-------+----------------+-------------------+ [id number]            Multiphasic, WNL                    +----------+--------+-------+----------------+-------------------+ +---------+--------+--+--------+--+---------+ VertebralPSV cm/s44EDV cm/s12Antegrade +---------+--------+--+--------+--+---------+  Left Carotid Findings: +----------+--------+--------+--------+------------------+--------+           PSV cm/sEDV cm/sStenosisPlaque DescriptionComments +----------+--------+--------+--------+------------------+--------+ CCA Prox  101     18                                         +----------+--------+--------+--------+------------------+--------+ CCA Distal74      13              calcific                   +----------+--------+--------+--------+------------------+--------+ ICA Prox  140     18      1-39%   calcific                   +----------+--------+--------+--------+------------------+--------+ ICA Mid   126     23                                         +----------+--------+--------+--------+------------------+--------+ ICA Distal104     20                                         +----------+--------+--------+--------+------------------+--------+ ECA       260             >50%    calcific                   +----------+--------+--------+--------+------------------+--------+  +----------+--------+--------+----------------+-------------------+           PSV cm/sEDV cm/sDescribe        Arm Pressure (mmHG) +----------+--------+--------+----------------+-------------------+ [id number]             Multiphasic, WNL                    +----------+--------+--------+----------------+-------------------+ +---------+--------+--+--------+--+---------+ VertebralPSV cm/s97EDV cm/s15Antegrade +---------+--------+--+--------+--+---------+   Summary: Right Carotid: Velocities in the right ICA are consistent with a 1-39% stenosis.                The ECA appears >50% stenosed. Left Carotid: Velocities in the left ICA are consistent with a 1-39% stenosis.  The ECA appears >50% stenosed. Vertebrals:  Bilateral vertebral arteries demonstrate antegrade flow. Subclavians: Normal flow hemodynamics were seen in bilateral subclavian              arteries. *See table(s) above for measurements and observations.     Preliminary     EKG: Independently reviewed.  Sinus tachycardia at 114.    Assessment/Plan Principal Problem:   Acute CVA (cerebrovascular accident) [facility]) Active Problems:   Hyperlipidemia   Hypothyroidism   Chronic diastolic heart failure (HCC)   Atrial fibrillation (HCC)   AV heart block   Right leg weakness   Acute CVA/right leg weakness:  We'll admit patient to telemetry unit with neuro checks and NIH score assessment per nursing protocol.  Supportive care  with supplemental oxygen as needed.  Home regimen of metoprolol is held, Cozaar is held to allow for permissive hypertension. Patient's home regimen of Eliquis 2.5 mg twice a day has been continued. Neurology has been consulted for acute CVA.  Patient continued on her statin therapy. PT consult OT consult.  Fall and aspiration precaution. MRI of the brain noncontrast.  Hyperlipidemia: A.m. lipid panel, cardiac diet, continue Lipitor.  Hypothyroidism: We'll continue patient on home  regimen of levothyroxine at 88 mcg daily. We will also check patient's TSH and free T4.  Chronic diastolic heart failure: Stable, daily I's and O's, daily weights. We will continue patient on Eliquis, atorvastatin, no recent echocardiogram which we will obtain during this visit.  Paroxysmal atrial fibrillation: Patient has history of paroxysmal atrial fibrillation we'll continue patient on Eliquis and atorvastatin.  AV heart block: Patient has a history of bradycardia and has been seen by EP physiology for possible pacemaker option.  After discussing with the patient about the patient's heart rate I advised daughter to get mom a sleep study consult I suspect it may be related to hypoxia.  DVT prophylaxis:  Heparin  Code Status:  Full code  Family Communication:  Spaeth,[name] (Daughter)  [phone] (Mobile)   Disposition Plan:  To be determined  Consults called:  Neurology consult per ED MD  Admission status: Inpatient   Para Skeans MD [facility] [phone] How to contact the [facility] Attending or Consulting provider [employer] or covering provider during after hours [place], for this patient?    1. Check the care team in [facility] and look for a) attending/consulting [facility] provider listed and b) the [facility] team listed 2. Log into [email or web address] and use [place]'s universal password to access. If you do not have the password, please contact the hospital operator. 3. Locate the [facility] provider you are looking for under [facility] and page to a number that you can be directly reached. 4. If you still have difficulty reaching the provider, please page the [facility] (Director on Call) for the Hospitalists listed on amion for assistance. [email or web address] Password [facility] 01/27/2021, 5:44 PM

## 2021-01-28 ENCOUNTER — Inpatient Hospital Stay (HOSPITAL_COMMUNITY): Payer: Medicare Other

## 2021-01-28 DIAGNOSIS — I10 Essential (primary) hypertension: Secondary | ICD-10-CM

## 2021-01-28 DIAGNOSIS — I4891 Unspecified atrial fibrillation: Secondary | ICD-10-CM | POA: Diagnosis not present

## 2021-01-28 DIAGNOSIS — I639 Cerebral infarction, unspecified: Secondary | ICD-10-CM | POA: Diagnosis not present

## 2021-01-28 LAB — LIPID PANEL
Cholesterol: 123 mg/dL (ref 0–200)
HDL: 55 mg/dL (ref >40)
LDL Cholesterol: 48 mg/dL (ref 0–99)
Total CHOL/HDL Ratio: 2.2 RATIO
Triglycerides: 102 mg/dL (ref <150)
VLDL: 20 mg/dL (ref 0–40)

## 2021-01-28 LAB — SARS CORONAVIRUS 2 (TAT 6-24 HRS): SARS Coronavirus 2: NEGATIVE

## 2021-01-28 LAB — HEMOGLOBIN A1C
Hgb A1c MFr Bld: 4.5 % — ABNORMAL LOW (ref 4.8–5.6)
Mean Plasma Glucose: 82.45 mg/dL

## 2021-01-28 LAB — T4, FREE: Free T4: 1.19 ng/dL — ABNORMAL HIGH (ref 0.61–1.12)

## 2021-01-28 LAB — TSH: TSH: 3.494 u[IU]/mL (ref 0.350–4.500)

## 2021-01-28 MED ORDER — METOPROLOL TARTRATE 25 MG PO TABS
25.0000 mg | ORAL_TABLET | Freq: Every day | ORAL | Status: DC
  Administered 2021-01-28 – 2021-01-30 (×3): 25 mg via ORAL
  Filled 2021-01-28 (×4): qty 1

## 2021-01-28 MED ORDER — APIXABAN 5 MG PO TABS
5.0000 mg | ORAL_TABLET | Freq: Two times a day (BID) | ORAL | Status: DC
  Administered 2021-01-28 – 2021-02-06 (×18): 5 mg via ORAL
  Filled 2021-01-28 (×18): qty 1

## 2021-01-28 MED ORDER — IOHEXOL 350 MG/ML SOLN
75.0000 mL | Freq: Once | INTRAVENOUS | Status: AC | PRN
  Administered 2021-01-28: 75 mL via INTRAVENOUS

## 2021-01-28 NOTE — Progress Notes (Signed)
ANTICOAGULATION CONSULT NOTE - Initial Consult  Pharmacy Consult for apixaban Indication: atrial fibrillation  Allergies  Allergen Reactions  . Ibuprofen Other (See Comments)    Hx of GI bleed  . Tapentadol Other (See Comments)    Nucynta- Knows she "cannot take"- made the patient "very sick"   . Ace Inhibitors Cough  . Angiotensin Receptor Blockers Cough  . Dicloxacillin Nausea Only and Other (See Comments)    Made the patient lightheaded, also  . Doxycycline Nausea Only  . Hydrocodone Nausea Only and Other (See Comments)    Made the patient lightheaded, also  . Nsaids     Can tolerate only Tylenol (has a history of GI BLEEDS)  . Adhesive [Tape] Rash    Blisters (can use only paper tape)  . Latex Rash and Other (See Comments)    Paper tape only    Patient Measurements: Height: 5\' 2"  (157.5 cm) Weight: 64.3 kg (141 lb 12.1 oz) IBW/kg (Calculated) : 50.1   Vital Signs: Temp: 97.5 F (36.4 C) (02/05 1220) Temp Source: Oral (02/05 1220) BP: 127/65 (02/05 1220) Pulse Rate: 65 (02/05 1220)  Labs: Recent Labs    01/27/21 0643 01/27/21 0647  HGB 12.4 13.3  HCT 38.6 39.0  PLT 204  --   APTT 33  --   LABPROT 13.7  --   INR 1.1  --   CREATININE 1.04* 1.00    Estimated Creatinine Clearance: 32.3 mL/min (by C-G formula based on SCr of 1 mg/dL).   Medical History: Past Medical History:  Diagnosis Date  . Acute blood loss anemia 05/16/2018  . AIHA (autoimmune hemolytic anemia) (HCC)   . Allergic rhinitis due to pollen 01/18/2021  . Anxiety disorder 01/18/2021  . Aortic stenosis   . Atherosclerotic heart disease of native coronary artery without angina pectoris 01/18/2021  . Atrial fibrillation ([place])    Noted during recent cardiac surgery and treated with amiodarone   . Autoimmune hemolytic anemia (HCC)   . AV heart block 11/23/2020  . CAD (coronary artery disease) 12/26/2011   Severe ostial LM disease noted at cath 12/26/11   . Chronic diastolic heart failure ([place])  01/17/2012  . Complication of surgical procedure 01/18/2021  . Constipation 01/18/2021  . Coronary artery disease    Cath 1/13 left main  . 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 Dyspnea   . Elevated bilirubin 05/14/2018  . Essential hypertension 01/18/2021  . Hypercoagulable state ([place]) 01/18/2021  . Hyperlipidemia   . Hypertension   . Hypertensive heart disease without CHF   . Hyponatremia 01/17/2012  . Hypothyroidism   . Long term (current) use of anticoagulants 07/14/2013  . Lumbar degenerative disc disease    Prior lumbar laminectomy 12/2009 Dr. Rolena Infante.  Failed epidural steroids.  Currently on gabapentin   . Lumbar disc disease   . Lung nodules    Sees [facility], thought to be benign  . Memory problem 01/18/2021  . Mixed hyperlipidemia 01/18/2021  . Obesity   . Obstructive jaundice 05/14/2018  . Overactive bladder 01/18/2021  . PAF (paroxysmal atrial fibrillation) ([place]) 01/30/2019  . Paroxysmal atrial fibrillation ([place]) 01/30/2019  . Personal history of solitary pulmonary nodule 11/07/2011   Followed in Pulmonary clinic/ [facility]/ [facility]   - PET 11/19/11 indeterminant LUL nodule but slt reduced in size vs prev studies so rec f/u cxr in 2 months (tickle file) Wedge resection by Dr. Servando Snare at time of AVR/CABG 12/28/2011 >   wedge biopsy/resection, Left upper lobe -  NON-NECROTIZING GRANULOMATOUS INFLAMMATION WITH ASSOCIATED MULTINUCLEATED GIANT CELLS.   . Polyp of colon 01/18/2021  . S/P AVR and CABG 12/28/2011   19 mm Magna Ease pericardial tissue valve CABG x 3 (LIMA-LAD, SVG-Int, SVG-dRCA)12/28/2011 Dr. Servando Snare    . Sciatica 01/18/2021  . Senile purpura ([place]) 01/18/2021  . Sepsis ([place]) 08/17/2018  . Spinal stenosis   . Villous adenoma of rectum    Assessment: 87 yo female with h/o atrial fibrillation admitted for Acute CVA. Patient takes apixaban 2.5mg  BID at home PTA. Patient is >28 yo, but weight is >60kg and Scr 5mg /dl. Asked by Neurology to evaluate current dose.  Will adjust apixaban to 5mg  BID.     Goal of Therapy:  Prevention of stroke Monitor platelets by anticoagulation protocol: Yes   Plan:  Increase apixaban to 5mg  BID Monitor for bleeding.   Prudy Candy A. Levada Dy, PharmD, BCPS, FNKF Clinical Pharmacist [place] Please utilize Amion for appropriate phone number to reach the unit pharmacist ([place])   01/28/2021,2:16 PM

## 2021-01-28 NOTE — Evaluation (Signed)
Occupational Therapy Evaluation Patient Details Name: Carol [name] MRN: [id number] DOB: January 16, 1928 Today's Date: 01/28/2021    History of Present Illness Pt is a 87 y.o. female who presents with R leg weakness and slurred speech. 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 PMH: acute blood loss anemia, autoimmune hemoyltic anemia, a-fib on anticoagulation, heart disease, chronic diastolic heart failure, spinal stenosis, sciatica, s/p AVR and CABG, dyspnea, CAD, aortic stenosis, HTN, and hypothyroidism.   Clinical Impression   Pt was at ILF and independent in ADL/functional transfers without DME. Today Pt present with R inattention, L gaze preference (visual deficits), R sided weakness (chronic Bil shoulder weakness), decreased balance, coordination, mod A for SPT to BSC (harder going back to R) and mod to max A for all aspects of ADL. Pt will require skilled OT in the acute setting as well as SNF level therapy post-acute. Next session to focus on establishing HEP for RUE (incorporate LUE in task for Hand over hand?) and visual compensatory strategies as well as education for her daughter [name].     Follow Up Recommendations  SNF;Supervision/Assistance - 24 hour    Equipment Recommendations  3 in 1 bedside commode;Other (comment) (defer to next venue of care)    Recommendations for Other Services       Precautions / Restrictions Precautions Precautions: Fall Precaution Comments: R neglect; monitor HR; orthostatic hypotension; HOH Restrictions Weight Bearing Restrictions: No      Mobility Bed Mobility Overal bed mobility: Needs Assistance Bed Mobility: Supine to Sit;Sit to Supine     Supine to sit: HOB elevated;Mod assist (R side assist due to inattention and trunk assist) Sit to supine: Mod assist;+2 for safety/equipment (helicopter technique used for safe return supine)         Transfers Overall transfer level: Needs assistance Equipment used: 1 person hand held assist Transfers: Sit to/from [facility] Sit to Stand: Mod assist Stand pivot transfers: Mod assist       General transfer comment: gait belt used and blocking out R leg, weight shift assist for pivots, harder to pivot to R due to inattention    Balance Overall balance assessment: Needs assistance Sitting-balance support: Bilateral upper extremity supported;Feet supported Sitting balance-Leahy Scale: Poor Sitting balance - Comments: Bil UE support and min guard-minA for balance sitting statically EOB.   Standing balance support: Bilateral upper extremity supported Standing balance-Leahy Scale: Poor Standing balance comment: dependent on therapist for support                           ADL either performed or assessed with clinical judgement   ADL Overall ADL's : Needs assistance/impaired Eating/Feeding: NPO   Grooming: Moderate assistance;Wash/dry hands;Wash/dry face Grooming Details (indicate cue type and reason): multimodal cues used throughout Upper Body Bathing: Maximal assistance   Lower Body Bathing: Maximal assistance;Bed level   Upper Body Dressing : Maximal assistance;Sitting   Lower Body Dressing: Maximal assistance;Bed level   Toilet Transfer: Moderate assistance;Stand-pivot;BSC Toilet Transfer Details (indicate cue type and reason): R inattention, L gaze preference face to face utilized this session Toileting- Clothing Manipulation and Hygiene: Maximal assistance;+2 for safety/equipment;Sit to/from stand       Functional mobility during ADLs: Moderate assistance;Cueing for safety;Cueing for sequencing General ADL Comments: R sided weakness/inattention, L gaze preferece, confusion, decreased balance     Vision   Vision Assessment?: Yes;Vision impaired- to be further tested in  functional context Eye Alignment: Within Functional Limits Ocular  Range of Motion: Restricted on the right Alignment/Gaze Preference: Gaze left Tracking/Visual Pursuits: Requires cues, head turns, or add eye shifts to track;Impaired - to be further tested in functional context Visual Fields: Right visual field deficit;Impaired-to be further tested in functional context     Perception     Praxis      Pertinent Vitals/Pain Pain Assessment: Faces Faces Pain Scale: Hurts little more Pain Location: BLE Pain Descriptors / Indicators: Cramping;Grimacing;Guarding Pain Intervention(s): Limited activity within patient's tolerance;Monitored during session;Repositioned     Hand Dominance Right   Extremity/Trunk Assessment Upper Extremity Assessment Upper Extremity Assessment: Generalized weakness;RUE deficits/detail (Chronic Bil shoulder injury per daughter) RUE Deficits / Details: overall 3-/5, inattention impacting assessment RUE Sensation: WNL RUE Coordination: decreased fine motor;decreased gross motor   Lower Extremity Assessment Lower Extremity Assessment: Defer to PT evaluation   Cervical / Trunk Assessment Cervical / Trunk Assessment: Kyphotic   Communication Communication Communication: Expressive difficulties (dysarthria)   Cognition Arousal/Alertness: Lethargic Behavior During Therapy: Anxious Overall Cognitive Status: Impaired/Different from baseline Area of Impairment: Orientation;Attention;Memory;Following commands;Safety/judgement;Problem solving;Awareness                 Orientation Level: Disoriented to;Place;Situation;Time Current Attention Level: Sustained Memory: Decreased recall of precautions;Decreased short-term memory Following Commands: Follows one step commands with increased time Safety/Judgement: Decreased awareness of safety;Decreased awareness of deficits Awareness: Intellectual Problem Solving: Slow processing;Decreased initiation;Difficulty sequencing;Requires verbal cues;Requires tactile cues General  Comments: Pt repeating questions frequently throughout session. Pt with R inattention. Needs multimodal cues to initiate and sequence all tasks. Struggled to follow directions during MMT and visual assessment   General Comments       Exercises     Shoulder Instructions      Home Living Family/patient expects to be discharged to:: Assisted living   Available Help at Discharge: Family;Available PRN/intermittently Type of Home: Assisted living                       Home Equipment: [place] - 4 wheels;Shower seat - built in;Grab bars - tub/shower;Grab bars - toilet;Hand held shower head   Additional Comments: Pt reports having a standard bed, handicap hight toilet, and walk-in shower.  Lives With: Alone    Prior Functioning/Environment Level of Independence: Independent        Comments: ambulating without DME/doing her own ADL (Pt may be poor historian?)        OT Problem List: Decreased strength;Decreased activity tolerance;Impaired balance (sitting and/or standing);Impaired vision/perception;Decreased coordination;Decreased cognition;Decreased safety awareness;Decreased knowledge of use of DME or AE;Impaired UE functional use      OT Treatment/Interventions: Self-care/ADL training;Therapeutic exercise;Neuromuscular education;DME and/or AE instruction;Therapeutic activities;Cognitive remediation/compensation;Visual/perceptual remediation/compensation;Patient/family education;Balance training    OT Goals(Current goals can be found in the care plan section) Acute Rehab OT Goals Patient Stated Goal: per daughter: improve independent function OT Goal Formulation: With patient/family Time For Goal Achievement: 02/11/21 Potential to Achieve Goals: Fair ADL Goals Pt Will Perform Grooming: with min guard assist;sitting Pt Will Perform Upper Body Dressing: with min guard assist;sitting Pt Will Perform Lower Body Dressing: with mod assist;sit to/from stand Pt Will Transfer to  Toilet: with mod assist;ambulating Pt Will Perform Toileting - Clothing Manipulation and hygiene: with mod assist;sitting/lateral leans Pt/caregiver will Perform Home Exercise Program: Right Upper extremity;With Supervision;With written HEP provided Additional ADL Goal #1: Pt will locate grooming items on the Right side of environment using visual compensatory strategies 75% of the time without  cues  OT Frequency: Min 2X/week   Barriers to D/C:            Co-evaluation              AM-PAC OT "6 Clicks" Daily Activity     Outcome Measure Help from another person eating meals?: A Lot Help from another person taking care of personal grooming?: A Lot Help from another person toileting, which includes using toliet, bedpan, or urinal?: A Lot Help from another person bathing (including washing, rinsing, drying)?: A Lot Help from another person to put on and taking off regular upper body clothing?: A Lot Help from another person to put on and taking off regular lower body clothing?: A Lot 6 Click Score: 12   End of Session Equipment Utilized During Treatment: Gait belt Nurse Communication: Mobility status  Activity Tolerance: Patient tolerated treatment well Patient left: in bed;with call bell/phone within reach;with bed alarm set;with family/visitor present  OT Visit Diagnosis: Unsteadiness on feet (R26.81);Other abnormalities of gait and mobility (R26.89);Muscle weakness (generalized) (M62.81);Other symptoms and signs involving the nervous system (R29.898);Other symptoms and signs involving cognitive function                Time: 1031-5945 OT Time Calculation (min): 24 min Charges:  OT General Charges $OT Visit: 1 Visit OT Evaluation $OT Eval Moderate Complexity: 1 Mod OT Treatments $Self Care/Home Management : 8-22 mins  Jesse Sans OTR/L Acute Rehabilitation Services Pager: [phone] Office: [place] 01/28/2021, 6:26 PM

## 2021-01-28 NOTE — Consult Note (Addendum)
Stroke neurology consult note  Referring Physician: Florencia Reasons, MD    Reason for Consult: stroke  HPI: Carol [name] is a 87 y.o. female  with a history of atrial fibrillation (on Eliquis 2.5 PTA), AV heart block, HTN, HLD, AS s/p AVR (tissue valve) and CAD s/p CABG in 2013, chronic dCHF, R CRAO, short term memory problems and autoimmune hemolytic anemia who presented to the [facility] ED 01/27/21 for evaluation of R-sided weakness and dysarthria which started in the afternoon of 01/26/21. An MRI revealed areas of acute infarct within the left cerebellar hemisphere and left caudate body extending into the lenticulocapsular region. Eliquis was continued at 2.5mg  bid. Neurology was consulted for stroke.   Per daughter at bedside, patient lives independent living, at baseline able to taking care of herself although has some short-term memory difficulty.  She takes medication daily.  She was on Coumadin initially but then switched to Eliquis 2.5 mg twice daily.  She denies any history of stroke in the past.  Date last known well: Date: 01/26/2021 Time last known well: Unable to determine tPA Given: no - the pt was on eliquis and outside the therapeutic window for treatment.  Past Medical History Past Medical History:  Diagnosis Date  . Acute blood loss anemia 05/16/2018  . AIHA (autoimmune hemolytic anemia) (HCC)   . Allergic rhinitis due to pollen 01/18/2021  . Anxiety disorder 01/18/2021  . Aortic stenosis   . Atherosclerotic heart disease of native coronary artery without angina pectoris 01/18/2021  . Atrial fibrillation ([place])    Noted during recent cardiac surgery and treated with amiodarone   . Autoimmune hemolytic anemia (HCC)   . AV heart block 11/23/2020  . CAD (coronary artery disease) 12/26/2011   Severe ostial LM disease noted at cath 12/26/11   . Chronic diastolic heart failure ([place]) 01/17/2012  . Complication of surgical procedure 01/18/2021  . Constipation 01/18/2021  . Coronary artery  disease    Cath 1/13 left main  . 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 Dyspnea   . Elevated bilirubin 05/14/2018  . Essential hypertension 01/18/2021  . Hypercoagulable state ([place]) 01/18/2021  . Hyperlipidemia   . Hypertension   . Hypertensive heart disease without CHF   . Hyponatremia 01/17/2012  . Hypothyroidism   . Long term (current) use of anticoagulants 07/14/2013  . Lumbar degenerative disc disease    Prior lumbar laminectomy 12/2009 Dr. Rolena Infante.  Failed epidural steroids.  Currently on gabapentin   . Lumbar disc disease   . Lung nodules    Sees [facility], thought to be benign  . Memory problem 01/18/2021  . Mixed hyperlipidemia 01/18/2021  . Obesity   . Obstructive jaundice 05/14/2018  . Overactive bladder 01/18/2021  . PAF (paroxysmal atrial fibrillation) ([place]) 01/30/2019  . Paroxysmal atrial fibrillation ([place]) 01/30/2019  . Personal history of solitary pulmonary nodule 11/07/2011   Followed in Pulmonary clinic/ [facility]/ [facility]   - PET 11/19/11 indeterminant LUL nodule but slt reduced in size vs prev studies so rec f/u cxr in 2 months (tickle file) Wedge resection by Dr. Servando Snare at time of AVR/CABG 12/28/2011 >   wedge biopsy/resection, Left upper lobe - NON-NECROTIZING GRANULOMATOUS INFLAMMATION WITH ASSOCIATED MULTINUCLEATED GIANT CELLS.   . Polyp of colon 01/18/2021  . S/P AVR and CABG 12/28/2011   19 mm Magna Ease pericardial tissue valve CABG x 3 (LIMA-LAD, SVG-Int, SVG-dRCA)12/28/2011 Dr. Servando Snare    . Sciatica 01/18/2021  . Senile purpura ([place]) 01/18/2021  . Sepsis (  [place]) 08/17/2018  . Spinal stenosis   . Villous adenoma of rectum     Surgical History Past Surgical History:  Procedure Laterality Date  . AORTIC VALVE REPLACEMENT  12/28/2011   Procedure: AORTIC VALVE REPLACEMENT (AVR);  Surgeon: Grace Isaac, MD;  Location: [place];  Service: Open Heart Surgery;  Laterality: N/A;  . CARDIAC CATHETERIZATION  2013  . CHOLECYSTECTOMY    . CORONARY ARTERY  BYPASS GRAFT  12/28/2011   Procedure: CORONARY ARTERY BYPASS GRAFTING (CABG);  Surgeon: Grace Isaac, MD;  Location: [place];  Service: Open Heart Surgery;  Laterality: N/A;  . HERNIA REPAIR    . LEFT AND RIGHT HEART CATHETERIZATION WITH CORONARY ANGIOGRAM N/A 12/26/2011   Procedure: LEFT AND RIGHT HEART CATHETERIZATION WITH CORONARY ANGIOGRAM;  Surgeon: Jacolyn Reedy, MD;  Location: [facility] CATH LAB;  Service: Cardiovascular;  Laterality: N/A;  . LUMBAR LAMINECTOMY    . PARTIAL COLECTOMY  2008   Villous adenoma    Family History  Family History  Problem Relation Age of Onset  . Lymphoma Sister   . Cancer Sister   . Breast cancer Sister   . Cancer Mother   . Heart attack Father   . Heart disease Father   . Heart failure Brother     Social History:   reports that she quit smoking about 42 years ago. Her smoking use included cigarettes. She has a 5.00 pack-year smoking history. She has never used smokeless tobacco. She reports that she does not drink alcohol and does not use drugs.  Allergies:  Allergies  Allergen Reactions  . Ibuprofen Other (See Comments)    Hx of GI bleed  . Tapentadol Other (See Comments)    Nucynta- Knows she "cannot take"- made the patient "very sick"   . Ace Inhibitors Cough  . Angiotensin Receptor Blockers Cough  . Dicloxacillin Nausea Only and Other (See Comments)    Made the patient lightheaded, also  . Doxycycline Nausea Only  . Hydrocodone Nausea Only and Other (See Comments)    Made the patient lightheaded, also  . Nsaids     Can tolerate only Tylenol (has a history of GI BLEEDS)  . Adhesive [Tape] Rash    Blisters (can use only paper tape)  . Latex Rash and Other (See Comments)    Paper tape only    Home Medications:  Medications Prior to Admission  Medication Sig Dispense Refill  . ALPRAZolam (XANAX) 0.25 MG tablet Take 0.25 mg by mouth at bedtime.  1  . atorvastatin (LIPITOR) 20 MG tablet TAKE 1 TABLET BY MOUTH EVERYDAY AT BEDTIME  (Patient taking differently: Take 20 mg by mouth at bedtime.) 90 tablet 2  . BESIVANCE 0.6 % SUSP Place 1 drop into the left eye See admin instructions. Place 1 drop left eye 4 times daily for 2 days after Avastin eye injections  11  . Calcium Carbonate-Vitamin D (CALCIUM 600 + D PO) Take 1 tablet by mouth every morning.    . diphenhydrAMINE (BENADRYL) 25 mg capsule Take 25 mg by mouth 2 (two) times daily as needed (or allergic reactions).    . docusate sodium (COLACE) 100 MG capsule Take 100 mg by mouth daily in the afternoon.    Marland Kitchen ELIQUIS 2.5 MG TABS tablet TAKE 1 TABLET BY MOUTH TWICE A DAY (Patient taking differently: Take 2.5 mg by mouth in the morning and at bedtime.) 180 tablet 2  . gabapentin (NEURONTIN) 100 MG capsule Take 100 mg by mouth 3 (  three) times daily.   0  . levothyroxine (SYNTHROID, LEVOTHROID) 88 MCG tablet Take 88 mcg by mouth daily before breakfast.   2  . losartan (COZAAR) 100 MG tablet TAKE 1 TABLET BY MOUTH EVERY DAY (Patient taking differently: Take 100 mg by mouth in the morning.) 90 tablet 2  . metoprolol tartrate (LOPRESSOR) 25 MG tablet Take 1 tablet (25 mg total) by mouth in the morning. 90 tablet 3  . Multiple Vitamins-Minerals (MULTI FOR HER 50+) TABS Take 1 tablet by mouth daily in the afternoon.    Marland Kitchen oxybutynin (DITROPAN) 5 MG tablet Take 5 mg by mouth every other day.    . polyethylene glycol (MIRALAX / GLYCOLAX) packet Take 17 g by mouth daily as needed for mild constipation (MIX AND DRINK).    . predniSONE (DELTASONE) 5 MG tablet Take 5 mg by mouth daily with breakfast.    . Probiotic Product (PROBIOTIC PO) Take 1 capsule by mouth daily.    Marland Kitchen SANTYL ointment Apply 1 application topically See admin instructions. Apply daily as directed to the affected toe    . cetirizine (ZYRTEC) 10 MG tablet Take 10 mg by mouth daily in the afternoon.      Hospital Medications . ALPRAZolam  0.25 mg Oral QHS  . apixaban  2.5 mg Oral BID  . atorvastatin  20 mg Oral QHS  .  gabapentin  100 mg Oral TID  . levothyroxine  88 mcg Oral QAC breakfast  . loratadine  10 mg Oral Daily  . oxybutynin  5 mg Oral QODAY  . predniSONE  5 mg Oral Q breakfast    ROS:  General ROS: negative for - chills, fatigue, fever, night sweats, weight gain or weight loss Psychological ROS: negative for - behavioral disorder, hallucinations, memory difficulties, mood swings or suicidal ideation Ophthalmic ROS: negative for - blurry vision, double vision, eye pain or loss of vision ENT ROS: negative for - epistaxis, nasal discharge, oral lesions, sore throat, tinnitus or vertigo Allergy and Immunology ROS: negative for - hives or itchy/watery eyes Hematological and Lymphatic ROS: negative for - bleeding problems, bruising or swollen lymph nodes Endocrine ROS: negative for - galactorrhea, hair pattern changes, polydipsia/polyuria or temperature intolerance Respiratory ROS: negative for - cough, hemoptysis, shortness of breath or wheezing Cardiovascular ROS: negative for - chest pain, dyspnea on exertion, edema or irregular heartbeat Gastrointestinal ROS: negative for - abdominal pain, diarrhea, hematemesis, nausea/vomiting or stool incontinence Genito-Urinary ROS: negative for - dysuria, hematuria, incontinence or urinary frequency/urgency Musculoskeletal ROS: negative for - joint swelling or muscular weakness Neurological ROS: as noted in HPI Dermatological ROS: negative for rash and skin lesion changes   Physical Examination:   Temp:  [97.5 F (36.4 C)-98.5 F (36.9 C)] 97.5 F (36.4 C) (02/05 1220) Pulse Rate:  [65-137] 65 (02/05 1220) Resp:  [17-20] 20 (02/05 1220) BP: (127-174)/(64-87) 127/65 (02/05 1220) SpO2:  [96 %-99 %] 96 % (02/05 1220) Weight:  [64.3 kg] 64.3 kg (02/04 1810)  General - Well nourished, well developed, in no apparent distress, mildly lethargic.  Ophthalmologic - fundi not visualized due to noncooperation.  Cardiovascular - irregularly irregular heart  rate and rhythm.  Neuro - awake alert, eyes open, however mildly lethargic.  Able to follow simple commands, paucity of speech, able to name 2/4 but with perseveration, able to repeat.  Seem to have right hemianopia, and left gaze preference, but able to have right gaze.  Right facial droop, tongue protrusion to the right.  Bilateral chronic  shoulder injury 3-/5 deltoid bilaterally. However, left bicep and tricep with finger grip 4/5, right bicep tricep and finger grip 3/5.  Left lower extremity proximal 3/5 with knee flexion and DF/PF 4/5.  Right lower extremity proximal 2/5 with knee flexion and DF/PF 3/5.  Sensation symmetrical subjectively.  Bilateral finger-to-nose intact.  Gait not tested.    LABORATORY STUDIES:  Basic Metabolic Panel: Recent Labs  Lab 01/27/21 0643 01/27/21 0647  NA 132* 134*  K 4.0 4.0  CL 97* 97*  CO2 26  --   GLUCOSE 115* 106*  BUN 18 21  CREATININE 1.04* 1.00  CALCIUM 9.3  --     Liver Function Tests: Recent Labs  Lab 01/27/21 0643  AST 22  ALT 17  ALKPHOS 54  BILITOT 1.0  PROT 6.5  ALBUMIN 3.8   No results for input(s): LIPASE, AMYLASE in the last 168 hours. No results for input(s): AMMONIA in the last 168 hours.  CBC: Recent Labs  Lab 01/27/21 0643 01/27/21 0647  WBC 8.5  --   NEUTROABS 5.8  --   HGB 12.4 13.3  HCT 38.6 39.0  MCV 90.2  --   PLT 204  --     Cardiac Enzymes: No results for input(s): CKTOTAL, CKMB, CKMBINDEX, TROPONINI in the last 168 hours.  BNP: Invalid input(s): POCBNP  CBG: Recent Labs  Lab 01/27/21 0647  GLUCAP 108*    Microbiology:   Coagulation Studies: Recent Labs    01/27/21 0643  LABPROT 13.7  INR 1.1    Urinalysis:  Recent Labs  Lab 01/27/21 0900  COLORURINE YELLOW  LABSPEC 1.005  PHURINE 7.0  GLUCOSEU NEGATIVE  HGBUR NEGATIVE  BILIRUBINUR NEGATIVE  KETONESUR NEGATIVE  PROTEINUR NEGATIVE  NITRITE POSITIVE*  LEUKOCYTESUR LARGE*    Lipid Panel:     Component Value Date/Time    CHOL 123 01/28/2021 0301   TRIG 102 01/28/2021 0301   HDL 55 01/28/2021 0301   CHOLHDL 2.2 01/28/2021 0301   VLDL 20 01/28/2021 0301   LDLCALC 48 01/28/2021 0301    HgbA1C:  Lab Results  Component Value Date   HGBA1C 4.5 (L) 01/28/2021    Urine Drug Screen:  No results found for: LABOPIA, COCAINSCRNUR, LABBENZ, AMPHETMU, THCU, LABBARB   Alcohol Level:  No results for input(s): ETH in the last 168 hours.   IMAGING:  CT HEAD WO CONTRAST 01/27/2021 IMPRESSION:  1. No acute finding.  2. Extensive chronic small vessel disease.   MR BRAIN WO CONTRAST 01/27/2021   IMPRESSION:  1. Areas of restricted diffusion within the left cerebellar hemisphere and left caudate body extending into the lenticulocapsular region, consistent with acute infarcts.  2. Remote lacunar infarcts in the bilateral cerebellar hemispheres.  3. Moderate chronic microvascular ischemic changes.   VAS US CAROTID (at [facility] and [facility] only) 01/27/2021 Summary:  Right Carotid: Velocities in the right ICA are consistent with a 1-39% stenosis. The ECA appears >50% stenosed.  Left Carotid: Velocities in the left ICA are consistent with a 1-39% stenosis. The ECA appears >50% stenosed.  Vertebrals:  Bilateral vertebral arteries demonstrate antegrade flow.  Subclavians: Normal flow hemodynamics were seen in bilateral subclavian arteries.  Preliminary    Transthoracic Echocardiogram - pending  ECG - ST rate 114  BPM. (See cardiology reading for complete details)  Assessment:  Carol [name] is a 87 y.o. female with history of atrial fibrillation (Eliquis PTA), AV heart block, Htn, Hld, AS s/p AVR (tissue valve) and CAD s/p Cabg in 2013,  chronic diastolic CHF, hx of right CRAO, memory problems and autoimmune hemolytic anemia who [facility] ED [facility]/27/21 for evaluation of RLE weakness and dysarthria. She did not receive IV t-PA due to anticoagulation and late presentation (>4.5 hours from time of  onset).  Stroke: acute infarct within the left cerebellar hemisphere and left caudate body extending into the lenticulocapsular region - likely cardioembolic due to atrial fibrillation on the subtherapeutic dose of eliquis  CT head - No acute finding. Extensive chronic small vessel disease.   MRI head - Areas of restricted diffusion within the left cerebellar hemisphere and left caudate body extending into the lenticulocapsular region, consistent with acute infarcts. Remote lacunar infarcts in the bilateral cerebellar hemispheres.   CTA head and neck pending  Carotid Doppler - unremarkable  2D Echo -  pending  [place] Corona Virus 2 neg  LDL - 48  HgbA1c - 4.5  VTE prophylaxis - Eliquis  Eliquis (apixaban) 2.5mg  bid prior to admission, now on Eliquis (apixaban) 5mg  bid  Ongoing aggressive stroke risk factor management  Therapy recommendations:  pending  Disposition:  Pending  Chronic AF with RVR  Rate fluctuate with intermittent RVR  Cardiology on board  Okay with gradually starting metoprolol  On Eliquis 2.5 twice daily PTA, now on Eliquis 5 mg twice daily correct dosing  Hypertension  Home BP meds: metoprolol ; Losartan  Current BP meds: metoprolol prn  Stable . Permissive hypertension (OK if < 220/120) but gradually normalize in 5-7 days . Long-term BP goal normotensive  Hyperlipidemia  Home Lipid lowering medication: Lipitor 20 mg daily  LDL 48, goal < 70  Current lipid lowering medication: Lipitor 20 mg daily  Note high intensity statin needed given advanced age and LDL at goal with low intensity statin  Continue statin at discharge  Other Stroke Risk Factors  Advanced age  Former cigarette smoker - quit > 40 years ago  Hx stroke/TIA -lateral cerebellum old infarcts by imaging  Coronary artery disease status post CABG  Diastolic congestive Heart Failure   AS status post AVR  Other Active Problems, Findings and Recommendations  Code  status - DNR  Right CRAO  Mild hyponatremia - Na 132->134  AV block  History of autoimmune related anemia - current hemoglobin stable, no sign of bleeding  Hospital day #1  Thank you for the consult and thank you for the opportunity taking care of but this patient.  We will follow.  Rosalin Hawking, MD PhD Stroke Neurology 01/28/2021 5:39 PM

## 2021-01-28 NOTE — Evaluation (Signed)
Speech Language Pathology Evaluation Patient Details Name: Carol [name] MRN: [id number] DOB: 11-22-1928 Today's Date: 01/28/2021 Time: 1535-1600 SLP Time Calculation (min) (ACUTE ONLY): 25 min  Problem List:  Patient Active Problem List   Diagnosis Date Noted  . Acute CVA (cerebrovascular accident) ([place]) 01/27/2021  . Right leg weakness 01/27/2021  . Allergic rhinitis due to pollen 01/18/2021  . Anxiety disorder 01/18/2021  . Complication of surgical procedure 01/18/2021  . Constipation 01/18/2021  . Hypercoagulable state ([place]) 01/18/2021  . Memory problem 01/18/2021  . Overactive bladder 01/18/2021  . Polyp of colon 01/18/2021  . Sciatica 01/18/2021  . Senile purpura ([place]) 01/18/2021  . Atherosclerotic heart disease of native coronary artery without angina pectoris 01/18/2021  . Mixed hyperlipidemia 01/18/2021  . Essential hypertension 01/18/2021  . AV heart block 11/23/2020  . Coronary artery disease   . Dyspnea   . Hypertension   . Lumbar disc disease   . Lung nodules   . Obesity   . Spinal stenosis   . Paroxysmal atrial fibrillation ([place]) 01/30/2019  . Sepsis ([place]) 08/17/2018  . Autoimmune hemolytic anemia (HCC)   . Acute blood loss anemia 05/14/2018  . Elevated bilirubin 05/14/2018  . Obstructive jaundice 05/14/2018  . 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 Long term (current) use of anticoagulants 07/14/2013  . Chronic diastolic heart failure ([place]) 01/17/2012  . Hyponatremia 01/17/2012  . Atrial fibrillation ([place])   . 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 CAD (coronary artery disease) 12/26/2011  . History of Villous adenoma of rectum   . Hypertensive heart disease without CHF   . Hyperlipidemia   . Hypothyroidism   . Lumbar degenerative disc disease   . Aortic stenosis 11/08/2011  . Personal history of solitary pulmonary nodule 11/07/2011   Past Medical History:  Past Medical History:  Diagnosis Date  . Acute blood loss anemia  05/16/2018  . AIHA (autoimmune hemolytic anemia) (HCC)   . Allergic rhinitis due to pollen 01/18/2021  . Anxiety disorder 01/18/2021  . Aortic stenosis   . Atherosclerotic heart disease of native coronary artery without angina pectoris 01/18/2021  . Atrial fibrillation ([place])    Noted during recent cardiac surgery and treated with amiodarone   . Autoimmune hemolytic anemia (HCC)   . AV heart block 11/23/2020  . CAD (coronary artery disease) 12/26/2011   Severe ostial LM disease noted at cath 12/26/11   . Chronic diastolic heart failure ([place]) 01/17/2012  . Complication of surgical procedure 01/18/2021  . Constipation 01/18/2021  . Coronary artery disease    Cath 1/13 left main  . 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 Dyspnea   . Elevated bilirubin 05/14/2018  . Essential hypertension 01/18/2021  . Hypercoagulable state ([place]) 01/18/2021  . Hyperlipidemia   . Hypertension   . Hypertensive heart disease without CHF   . Hyponatremia 01/17/2012  . Hypothyroidism   . Long term (current) use of anticoagulants 07/14/2013  . Lumbar degenerative disc disease    Prior lumbar laminectomy 12/2009 Dr. Rolena Infante.  Failed epidural steroids.  Currently on gabapentin   . Lumbar disc disease   . Lung nodules    Sees [facility], thought to be benign  . Memory problem 01/18/2021  . Mixed hyperlipidemia 01/18/2021  . Obesity   . Obstructive jaundice 05/14/2018  . Overactive bladder 01/18/2021  . PAF (paroxysmal atrial fibrillation) ([place]) 01/30/2019  . Paroxysmal atrial fibrillation ([place]) 01/30/2019  . Personal history of solitary pulmonary nodule 11/07/2011   Followed  in Pulmonary clinic/ [facility]/ [facility]   - PET 11/19/11 indeterminant LUL nodule but slt reduced in size vs prev studies so rec f/u cxr in 2 months (tickle file) Wedge resection by Dr. Servando Snare at time of AVR/CABG 12/28/2011 >   wedge biopsy/resection, Left upper lobe - NON-NECROTIZING GRANULOMATOUS INFLAMMATION WITH ASSOCIATED MULTINUCLEATED  GIANT CELLS.   . Polyp of colon 01/18/2021  . S/P AVR and CABG 12/28/2011   19 mm Magna Ease pericardial tissue valve CABG x 3 (LIMA-LAD, SVG-Int, SVG-dRCA)12/28/2011 Dr. Servando Snare    . Sciatica 01/18/2021  . Senile purpura ([place]) 01/18/2021  . Sepsis ([place]) 08/17/2018  . Spinal stenosis   . Villous adenoma of rectum    Past Surgical History:  Past Surgical History:  Procedure Laterality Date  . AORTIC VALVE REPLACEMENT  12/28/2011   Procedure: AORTIC VALVE REPLACEMENT (AVR);  Surgeon: Grace Isaac, MD;  Location: [place];  Service: Open Heart Surgery;  Laterality: N/A;  . CARDIAC CATHETERIZATION  2013  . CHOLECYSTECTOMY    . CORONARY ARTERY BYPASS GRAFT  12/28/2011   Procedure: CORONARY ARTERY BYPASS GRAFTING (CABG);  Surgeon: Grace Isaac, MD;  Location: [place];  Service: Open Heart Surgery;  Laterality: N/A;  . HERNIA REPAIR    . LEFT AND RIGHT HEART CATHETERIZATION WITH CORONARY ANGIOGRAM N/A 12/26/2011   Procedure: LEFT AND RIGHT HEART CATHETERIZATION WITH CORONARY ANGIOGRAM;  Surgeon: Jacolyn Reedy, MD;  Location: [facility] CATH LAB;  Service: Cardiovascular;  Laterality: N/A;  . LUMBAR LAMINECTOMY    . PARTIAL COLECTOMY  2008   Villous adenoma   HPI:  87 y.o. female presenting to the emergency room complaints of right leg weakness and slurred speech that started 01/26/21.  On 01/27/21,  MRI brain indicated Areas of restricted diffusion within the left cerebellar hemisphere and left caudate body extending into the lenticulocapsular region, consistent with acute infarcts. 2. Remote lacunar infarcts in the bilateral cerebellar hemispheres; pt passed Yale swallow screen.  SLE generated.  Assessment / Plan / Recommendation Clinical Impression  Pt was evaluated by portions of SLUMS ([place] Mental Status Examination), so a total score was not obtained, but  other means including informal conversation, prior level of functioning provided by her daughter, Carol [name]'s report  and informal  observations noted within session.  Pt's speech was judged to be 75% intelligible within conversation with repetition required to clarify information.  Pt noted to repeat questions within assessment and confuse information given by daughter or SLP when following multi-step directives.  Pt exhibited normal cognitive function prior to CVA and lived independently in an apartment at [facility] per daughter.  Pt also noted to have decreased awareness of visual deficit during assessment as pt would not attend to right side when speaking within conversational tasks.  Pt presented with reduced processing of information with simple directives, answering questions and recalling information/words.  She has hearing aids at baseline and these were not available, but daughter stated she was "very different" than prior functioning.  Pt oriented to self and place, but not time and situation.  ST will f/u for cognitive impairment and dysarthria management/treatment while in acute setting.  Thank you for this consult.      SLP Assessment  SLP Recommendation/Assessment: Patient needs continued Speech Lanaguage Pathology Services SLP Visit Diagnosis: Dysarthria and anarthria (R47.1);Attention and concentration deficit;Cognitive communication deficit (R41.841) Attention and concentration deficit following: Cerebral infarction    Follow Up Recommendations  Other (comment) ([facility] [place])  Frequency and Duration min 2x/week  1 week      SLP Evaluation Cognition  Overall Cognitive Status: Impaired/Different from baseline Orientation Level: Oriented to person;Disoriented to place;Disoriented to time;Disoriented to situation Attention: Sustained Sustained Attention: Impaired Sustained Attention Impairment: Verbal basic;Functional basic Behaviors: Restless;Perseveration Comments: Pt repeating questions per nursing; daughter stated her cognition is singificantly different than before       Comprehension   Auditory Comprehension Conversation: Simple Interfering Components: Working Marine scientist;Attention EffectiveTechniques: Extra processing time;Repetition;Visual/Gestural cues Visual Recognition/Discrimination Discrimination: Not tested Reading Comprehension Reading Status: Not tested    Expression Expression Primary Mode of Expression: Verbal Verbal Expression Overall Verbal Expression: Impaired Level of Generative/Spontaneous Verbalization: Sentence Interfering Components: Attention Non-Verbal Means of Communication: Not applicable Written Expression Dominant Hand: Right Written Expression: Not tested   Oral / Motor  Oral Motor/Sensory Function Overall Oral Motor/Sensory Function: Within functional limits Motor Speech Overall Motor Speech: Appears within functional limits for tasks assessed Respiration: Within functional limits Phonation: Low vocal intensity Resonance: Within functional limits Articulation: Impaired Level of Impairment: Sentence Intelligibility: Intelligibility reduced Word: 50-74% accurate Phrase: 50-74% accurate Sentence: 50-74% accurate Conversation: 25-49% accurate Motor Planning: Witnin functional limits Motor Speech Errors: Not applicable                       Elvina Sidle, M.S., CCC-SLP 01/28/2021, 4:54 PM

## 2021-01-28 NOTE — Consult Note (Signed)
Cardiology Consult    Patient ID: Carol [name]; [id number]; August 14, 1928   Admit date: 01/27/2021 Date of Consult: 01/28/2021  Primary Care Provider: Lujean Amel, MD Primary Cardiologist: Jenean Lindau, MD  Primary Electrophysiologist: Dr. Quentin Ore  Patient Profile    Carol [name] is a 87 y.o. female with past medical history of CAD (s/p CABG in 2013), severe AS (s/p tissue AVR in 2013), paroxysmal atrial fibrillation, chronic diastolic CHF, NSVT, HTN, HLD and Hypothyroidism who is being seen today for the evaluation of atrial fibrillation with RVR at the request of Dr. Erlinda Hong.   History of Present Illness    Carol [name] recently wore a Zio patch in 10/2020 which showed episodes of SVT and 3 episodes of nocturnal high-grade AV block with the longest lasting 9 seconds. Was referred to EP and was examined by Dr. Quentin Ore in 11/2020 and reported being active at her senior living facility and denied any recent dizziness or presyncope. Given her asymptomatic state at that time, conservative management was pursued and she was continued on Lopressor 25mg  in AM. She did follow-up with Dr. Geraldo Pitter on 01/20/2021 and denied any new symptoms, therefore she was continued on her current regimen.   She presented to [facility] ED on 01/27/2021 from ALF for evaluation of slurred speech and right leg weakness. TPA was not administered as her symptoms had started the day prior to admission. Initial labs showed WBC 8.5, Hgb 12.4, platelets 204, Na+ 132, K+ 4.0 and creatinine 1.04. COVID pending. EKG on admission showed sinus tachycardia, HR 114 with PVC's. CT Head showed no acute findings. MRI Brain shows areas of restricted diffusion within the left cerebellar hemisphere and left caudate body extending into the lenticulocapsular region, consistent with acute infarcts and remote lacunar infarcts in the bilateral cerebellar hemispheres.  In talking with the patient today, she does have some residual slurred  speech. Denies any pain at this current time. She was working with PT and HR was in the 140's at times but was elevated to this prior to exercise. She denies any associated palpitations. No recent chest pain, dyspnea, orthopnea or lower extremity edema. Does report dizziness with ambulating around the room.     Past Medical History:  Diagnosis Date  . Acute blood loss anemia 05/16/2018  . AIHA (autoimmune hemolytic anemia) (HCC)   . Allergic rhinitis due to pollen 01/18/2021  . Anxiety disorder 01/18/2021  . Aortic stenosis   . Atherosclerotic heart disease of native coronary artery without angina pectoris 01/18/2021  . Atrial fibrillation ([place])    Noted during recent cardiac surgery and treated with amiodarone   . Autoimmune hemolytic anemia (HCC)   . AV heart block 11/23/2020  . CAD (coronary artery disease) 12/26/2011   Severe ostial LM disease noted at cath 12/26/11   . Chronic diastolic heart failure ([place]) 01/17/2012  . Complication of surgical procedure 01/18/2021  . Constipation 01/18/2021  . Coronary artery disease    Cath 1/13 left main  . 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 Dyspnea   . Elevated bilirubin 05/14/2018  . Essential hypertension 01/18/2021  . Hypercoagulable state ([place]) 01/18/2021  . Hyperlipidemia   . Hypertension   . Hypertensive heart disease without CHF   . Hyponatremia 01/17/2012  . Hypothyroidism   . Long term (current) use of anticoagulants 07/14/2013  . Lumbar degenerative disc disease    Prior lumbar laminectomy 12/2009 Dr. Rolena Infante.  Failed epidural steroids.  Currently on gabapentin   .  Lumbar disc disease   . Lung nodules    Sees [facility], thought to be benign  . Memory problem 01/18/2021  . Mixed hyperlipidemia 01/18/2021  . Obesity   . Obstructive jaundice 05/14/2018  . Overactive bladder 01/18/2021  . PAF (paroxysmal atrial fibrillation) ([place]) 01/30/2019  . Paroxysmal atrial fibrillation ([place]) 01/30/2019  . Personal history of solitary  pulmonary nodule 11/07/2011   Followed in Pulmonary clinic/ [facility]/ [facility]   - PET 11/19/11 indeterminant LUL nodule but slt reduced in size vs prev studies so rec f/u cxr in 2 months (tickle file) Wedge resection by Dr. Servando Snare at time of AVR/CABG 12/28/2011 >   wedge biopsy/resection, Left upper lobe - NON-NECROTIZING GRANULOMATOUS INFLAMMATION WITH ASSOCIATED MULTINUCLEATED GIANT CELLS.   . Polyp of colon 01/18/2021  . S/P AVR and CABG 12/28/2011   19 mm Magna Ease pericardial tissue valve CABG x 3 (LIMA-LAD, SVG-Int, SVG-dRCA)12/28/2011 Dr. Servando Snare    . Sciatica 01/18/2021  . Senile purpura ([place]) 01/18/2021  . Sepsis ([place]) 08/17/2018  . Spinal stenosis   . Villous adenoma of rectum     Past Surgical History:  Procedure Laterality Date  . AORTIC VALVE REPLACEMENT  12/28/2011   Procedure: AORTIC VALVE REPLACEMENT (AVR);  Surgeon: Grace Isaac, MD;  Location: [facility];  Service: Open Heart Surgery;  Laterality: N/A;  . CARDIAC CATHETERIZATION  2013  . CHOLECYSTECTOMY    . CORONARY ARTERY BYPASS GRAFT  12/28/2011   Procedure: CORONARY ARTERY BYPASS GRAFTING (CABG);  Surgeon: Grace Isaac, MD;  Location: [facility];  Service: Open Heart Surgery;  Laterality: N/A;  . HERNIA REPAIR    . LEFT AND RIGHT HEART CATHETERIZATION WITH CORONARY ANGIOGRAM N/A 12/26/2011   Procedure: LEFT AND RIGHT HEART CATHETERIZATION WITH CORONARY ANGIOGRAM;  Surgeon: Jacolyn Reedy, MD;  Location: [facility] CATH LAB;  Service: Cardiovascular;  Laterality: N/A;  . LUMBAR LAMINECTOMY    . PARTIAL COLECTOMY  2008   Villous adenoma     Home Medications:  Prior to Admission medications   Medication Sig Start Date End Date Taking? Authorizing Provider  ALPRAZolam (XANAX) 0.25 MG tablet Take 0.25 mg by mouth at bedtime. 02/04/16  Yes [provider]  atorvastatin (LIPITOR) 20 MG tablet TAKE 1 TABLET BY MOUTH EVERYDAY AT BEDTIME Patient taking differently: Take 20 mg by mouth at bedtime. 12/06/20  Yes Revankar,  Reita Cliche, MD  BESIVANCE 0.6 % SUSP Place 1 drop into the left eye See admin instructions. Place 1 drop left eye 4 times daily for 2 days after Avastin eye injections 11/23/16  Yes [provider]  Calcium Carbonate-Vitamin D (CALCIUM 600 + D PO) Take 1 tablet by mouth every morning.   Yes [provider]  diphenhydrAMINE (BENADRYL) 25 mg capsule Take 25 mg by mouth 2 (two) times daily as needed (or allergic reactions).   Yes [provider]  docusate sodium (COLACE) 100 MG capsule Take 100 mg by mouth daily in the afternoon.   Yes [provider]  ELIQUIS 2.5 MG TABS tablet TAKE 1 TABLET BY MOUTH TWICE A DAY Patient taking differently: Take 2.5 mg by mouth in the morning and at bedtime. 03/02/20  Yes Revankar, Reita Cliche, MD  gabapentin (NEURONTIN) 100 MG capsule Take 100 mg by mouth 3 (three) times daily.  06/24/15  Yes [provider]  levothyroxine (SYNTHROID, LEVOTHROID) 88 MCG tablet Take 88 mcg by mouth daily before breakfast.  06/28/15  Yes [provider]  losartan (COZAAR) 100 MG tablet  TAKE 1 TABLET BY MOUTH EVERY DAY Patient taking differently: Take 100 mg by mouth in the morning. 11/11/20  Yes Revankar, Reita Cliche, MD  metoprolol tartrate (LOPRESSOR) 25 MG tablet Take 1 tablet (25 mg total) by mouth in the morning. 11/23/20  Yes Revankar, Reita Cliche, MD  Multiple Vitamins-Minerals (MULTI FOR HER 50+) TABS Take 1 tablet by mouth daily in the afternoon.   Yes [provider]  oxybutynin (DITROPAN) 5 MG tablet Take 5 mg by mouth every other day.   Yes [provider]  polyethylene glycol (MIRALAX / GLYCOLAX) packet Take 17 g by mouth daily as needed for mild constipation ([place]).   Yes [provider]  predniSONE (DELTASONE) 5 MG tablet Take 5 mg by mouth daily with breakfast.   Yes [provider]  Probiotic Product (PROBIOTIC PO) Take 1 capsule by mouth daily.   Yes [provider]  SANTYL ointment  Apply 1 application topically See admin instructions. Apply daily as directed to the affected toe 01/11/21  Yes [provider]  cetirizine (ZYRTEC) 10 MG tablet Take 10 mg by mouth daily in the afternoon.    [provider]    Inpatient Medications: Scheduled Meds: . ALPRAZolam  0.25 mg Oral QHS  . apixaban  2.5 mg Oral BID  . atorvastatin  20 mg Oral QHS  . gabapentin  100 mg Oral TID  . levothyroxine  88 mcg Oral QAC breakfast  . loratadine  10 mg Oral Daily  . oxybutynin  5 mg Oral QODAY  . predniSONE  5 mg Oral Q breakfast   Continuous Infusions: . sodium chloride 50 mL/hr at 01/27/21 1501   PRN Meds: acetaminophen **OR** acetaminophen (TYLENOL) oral liquid 160 mg/5 mL **OR** acetaminophen, metoprolol tartrate  Allergies:    Allergies  Allergen Reactions  . Ibuprofen Other (See Comments)    Hx of GI bleed  . Tapentadol Other (See Comments)    Nucynta- Knows she "cannot take"- made the patient "very sick"   . Ace Inhibitors Cough  . Angiotensin Receptor Blockers Cough  . Dicloxacillin Nausea Only and Other (See Comments)    Made the patient lightheaded, also  . Doxycycline Nausea Only  . Hydrocodone Nausea Only and Other (See Comments)    Made the patient lightheaded, also  . Nsaids     Can tolerate only Tylenol (has a history of GI BLEEDS)  . Adhesive [Tape] Rash    Blisters (can use only paper tape)  . Latex Rash and Other (See Comments)    Paper tape only    Social History:   Social History   Socioeconomic History  . Marital status: Widowed    Spouse name: Not on file  . Number of children: 4  . Years of education: 79  . Highest education level: 12th grade  Occupational History  . Occupation: retired Scientist, clinical (histocompatibility and immunogenetics)  Tobacco Use  . Smoking status: Former Smoker    Packs/day: 0.50    Years: 10.00    Pack years: 5.00    Types: Cigarettes    Quit date: 12/24/1978    Years since quitting: 42.1  . Smokeless tobacco: Never Used  Vaping Use  .  Vaping Use: Never used  Substance and Sexual Activity  . Alcohol use: No  . Drug use: No  . Sexual activity: Not Currently    Birth control/protection: None  Other Topics Concern  . Not on file  Social History Narrative   Lives at [facility]  Apartment   Phone [phone]   [address]   Caffeine use: Drinks coffee- 2 cups per day at the most per pt   Social Determinants of Health   Financial Resource Strain: Not on file  Food Insecurity: Not on file  Transportation Needs: Not on file  Physical Activity: Not on file  Stress: Not on file  Social Connections: Not on file  Intimate Partner Violence: Not on file     Family History:    Family History  Problem Relation Age of Onset  . Lymphoma Sister   . Cancer Sister   . Breast cancer Sister   . Cancer Mother   . Heart attack Father   . Heart disease Father   . Heart failure Brother       Review of Systems    General:  No chills, fever, night sweats or weight changes.  Cardiovascular:  No chest pain, dyspnea on exertion, edema, orthopnea, palpitations, paroxysmal nocturnal dyspnea. Dermatological: No rash, lesions/masses Respiratory: No cough, dyspnea Urologic: No hematuria, dysuria Abdominal:   No nausea, vomiting, diarrhea, bright red blood per rectum, melena, or hematemesis Neurologic:  No visual changes. Positive for weakness and slurred speech.   All other systems reviewed and are otherwise negative except as noted above.  Physical Exam/Data    Vitals:   01/27/21 2103 01/28/21 0644 01/28/21 0819 01/28/21 1220  BP: (!) 174/75 132/64 (!) 149/69 127/65  Pulse: (!) 117 (!) 110 (!) 137 65  Resp: 17 17 18 20   Temp: 98 F (36.7 C)  97.9 F (36.6 C) (!) 97.5 F (36.4 C)  TempSrc:   Oral Oral  SpO2: 99%  98% 96%  Weight:      Height:        Intake/Output Summary (Last 24 hours) at 01/28/2021 1233 Last data filed at 01/28/2021 0600 Gross per 24 hour  Intake --  Output 600 ml  Net -600 ml   Filed  Weights   01/27/21 1810  Weight: 64.3 kg   Body mass index is 25.93 kg/m.   General: Pleasant elderly female appearing in NAD Psych: Normal affect. Neuro: Alert and oriented X 3.  HEENT: Normal in appearance.  Neck: Supple without bruits or JVD. Lungs:  Resp regular and unlabored, CTA without wheezing or rales. Heart: Irregularly irregular. no s3, s4. 2/6 SEM along RUSB.  Abdomen: Soft, non-tender, non-distended, BS + x 4.  Extremities: No clubbing. Trace lower extremity edema. DP/PT/Radials 2+ and equal bilaterally.   EKG:  The EKG was personally reviewed and demonstrates: sinus tachycardia, HR 114 with PVC's. No acute ST abnormalities when compared to prior tracings.   Telemetry:  Telemetry was personally reviewed and demonstrates: Atrial fibrillation with RVR, HR in 120's to 140's. Occasional PVC's.    Labs/Studies     Relevant CV Studies:  Zio Monitor: 11/04/2020 Baseline rhythm: Sinus  Minimum heart rate: 59 BPM.  Average heart rate: 88 BPM.  Maximal heart rate 129 BPM.  Atrial arrhythmia: Atrial runs longest lasting 15.9 seconds with an average rate of 130/min  Ventricular arrhythmia: 4 episodes of nonsustained ventricular tachycardia longest lasting 5 beats.  Ventricular bigeminy and trigeminy were also noted.  Conduction abnormality: Patient has high-grade AV conduction defects.  4 episodes of high-grade AV blocks were seen.  Also was noted Mobitz 1 AV block.  Symptoms: None significant   Carotid Dopplers: 01/27/2021 Summary:  Right Carotid: Velocities in the right ICA are consistent with a 1-39%  stenosis.  The ECA appears >50% stenosed.   Left Carotid: Velocities in the left ICA are consistent with a 1-39%  stenosis.        The ECA appears >50% stenosed.   Vertebrals: Bilateral vertebral arteries demonstrate antegrade flow.  Subclavians: Normal flow hemodynamics were seen in bilateral subclavian        arteries.     Laboratory Data:  Chemistry Recent Labs  Lab 01/27/21 0643 01/27/21 0647  NA 132* 134*  K 4.0 4.0  CL 97* 97*  CO2 26  --   GLUCOSE 115* 106*  BUN 18 21  CREATININE 1.04* 1.00  CALCIUM 9.3  --   GFRNONAA 51*  --   ANIONGAP 9  --     Recent Labs  Lab 01/27/21 0643  PROT 6.5  ALBUMIN 3.8  AST 22  ALT 17  ALKPHOS 54  BILITOT 1.0   Hematology Recent Labs  Lab 01/27/21 0643 01/27/21 0647  WBC 8.5  --   RBC 4.28  --   HGB 12.4 13.3  HCT 38.6 39.0  MCV 90.2  --   MCH 29.0  --   MCHC 32.1  --   RDW 18.2*  --   PLT 204  --    Cardiac EnzymesNo results for input(s): TROPONINI in the last 168 hours. No results for input(s): TROPIPOC in the last 168 hours.  BNPNo results for input(s): BNP, PROBNP in the last 168 hours.  DDimer No results for input(s): DDIMER in the last 168 hours.  Radiology/Studies:  CT HEAD WO CONTRAST  Result Date: 01/27/2021 CLINICAL DATA:  Right-sided weakness with slurred speech EXAM: CT HEAD WITHOUT CONTRAST TECHNIQUE: Contiguous axial images were obtained from the base of the skull through the vertex without intravenous contrast. COMPARISON:  02/19/2017 FINDINGS: Brain: No evidence of acute gray matter infarction, hemorrhage, hydrocephalus, extra-axial collection or mass lesion/mass effect. Confluent chronic small vessel ischemia in the deep cerebral white matter. Increased number of chronic appearing bilateral cerebellar infarcts. Age congruent brain volume Vascular: No hyperdense vessel or unexpected calcification. Skull: Normal. Negative for fracture or focal lesion. Sinuses/Orbits: Bilateral cataract resection. IMPRESSION: 1. No acute finding. 2. Extensive chronic small vessel disease. Electronically Signed   By: Monte Fantasia M.D.   On: 01/27/2021 07:23   MR BRAIN WO CONTRAST  Addendum Date: 01/27/2021   ADDENDUM REPORT: 01/27/2021 12:00 ADDENDUM: These results were called by telephone at the time of interpretation on 01/27/2021 at 12:00 pm  to provider [facility] , who verbally acknowledged these results. Electronically Signed   By: Pedro Earls M.D.   On: 01/27/2021 12:00   Result Date: 01/27/2021 CLINICAL DATA:  Stroke suspected. EXAM: MRI HEAD WITHOUT CONTRAST TECHNIQUE: Multiplanar, multiecho pulse sequences of the brain and surrounding structures were obtained without intravenous contrast. COMPARISON:  Head CT January 27, 2021. FINDINGS: Brain: Areas of restricted diffusion within the left cerebellar hemisphere and left caudate body extending into the lenticulocapsular region, consistent with acute infarct. No hemorrhage, hydrocephalus, extra-axial collection or mass lesion. Scattered and confluent foci of T2 hyperintensity are seen within the white matter of the cerebral hemispheres, nonspecific, most likely related to chronic microvascular ischemic changes. Remote lacunar infarcts in the bilateral cerebellar hemispheres. Vascular: Normal flow voids. Skull and upper cervical spine: Normal marrow signal. Sinuses/Orbits: Bilateral lens surgery. Paranasal sinuses are clear. IMPRESSION: 1. Areas of restricted diffusion within the left cerebellar hemisphere and left caudate body extending into the lenticulocapsular region, consistent with acute infarcts. 2. Remote lacunar infarcts  in the bilateral cerebellar hemispheres. 3. Moderate chronic microvascular ischemic changes. Electronically Signed: By: Pedro Earls M.D. On: 01/27/2021 11:54   Assessment & Plan    1. Atrial Fibrillation with RVR - She has a history of paroxysmal atrial fibrillation and went into atrial fibrillation with RVR yesterday evening. Some strips appear most consistent with 2:1 flutter at times on telemetry. Will ask for a repeat 12-Lead EKG. TSH and electrolytes are within a normal range.  - She was on Lopressor 25mg  in the AM prior to admission and was not on BID dosing given episodes of nocturnal heart-block as outlined above.  Lopressor was held at the time of admission to allow for permissive HTN in the setting of her acute CVA. If unable to restart Lopressor, would need to consider the use of Amiodarone given her rates currently in the 130's to 140's but again she does have a history of transient heart block which complicates the situation. Digoxin is not ideal in the setting of her advanced age.  - This patients CHA2DS2-VASc Score and unadjusted Ischemic Stroke Rate (% per year) is equal to 11.2 % stroke rate/year from a score of 7 (HTN, Vascular, Female, Age (2), CVA (2)). She was on Eliquis 2.5mg  BID prior to admission but given her only indication for reduced dosing is age (weight is above 60 kg and creatinine has been less than 1.5), she should be on 5mg  BID. In review of her primary Cardiologist's notes, it looks like she has always been on 2.5mg  BID. She does have a history of hemolytic anemia but it does not appear she has experienced any recent issues with bleeding.   2. CAD - She is s/p CABG in 2013. Denies any recent anginal symptoms. A repeat echocardiogram is pending.  - Continue Atorvastatin 20mg  daily. Was not on ASA due to the use of anticoagulation and BB currently held given her acute CVA.   3. Severe AS - She is s/p tissue AVR in 2013. A repeat echocardiogram has been ordered but not yet performed.   4. HLD - FLP shows total cholesterol of 123, triglycerides 102, HDL 55 and LDL 48. - Continue Atorvastatin 20mg  daily.   5. Hypothyroidism - TSH at 3.494 this admission.Continue Synthroid 88 mcg daily.    For questions or updates, please contact [place] Please consult [email or web address] for contact info under Cardiology/STEMI.  Signed, Erma Heritage, PA-C 01/28/2021, 12:33 PM Pager: [phone]

## 2021-01-28 NOTE — Progress Notes (Signed)
PROGRESS NOTE    Carol [name]  CNO:[id number] DOB: 12-11-1928 DOA: 01/27/2021 PCP: Lujean Amel, MD    Chief Complaint  Patient presents with  . Right Side Weakness/Slurred Speech    LSN : Yesterday Afternoon     Brief Narrative:  Carol [name] is a 87 y.o. female presenting to the emergency room complaints of right leg weakness and slurred speech that started 2/3 afternoon sometime.    Patient has past medical history of acute blood loss anemia, autoimmune hemolytic anemia, atrial fibrillation on anticoagulatio  Subjective:  She has slurred speech, she is in afib/rvr, denies chest pain, bp stable. No hypoxia, no edema  Assessment & Plan:   Principal Problem:   Acute CVA (cerebrovascular accident) ([place]) Active Problems:   Hyperlipidemia   Hypothyroidism   Chronic diastolic heart failure (HCC)   Atrial fibrillation (HCC)   AV heart block   Right leg weakness  Acute CVA -Present with slurred speech and right leg weakness - MRI " Areas of restricted diffusion within the left cerebellar hemisphere and left caudate body extending into the lenticulocapsular region, consistent with acute infarcts. 2. Remote lacunar infarcts in the bilateral cerebellar hemispheres. 3. Moderate chronic microvascular ischemic changes." Neurology consulted , will follow neuro recommendation  Acute metabolic encephalopathy  Daughter reports patient as baseline is aaox3,  takes her meds reliably , patient is from independent living ([place] ), walks with a walker/cane Likely from UTI and acute CVA ,expect improvement   Afib/RVR, recent history of bradycardia, was evaluated by EP physiology, metoprolol changed to once a day at home per daughter report -Possibly from stress and holding metoprolol for permissive hypertension -Cardiology consulted  CAD (s/p CABG in 2013), severe AS (s/p tissue AVR in 2013),   chronic diastolic CHF: Appears euvolemic -Cardiology on  board  UTI Urine culture positive for Klebsiella pneumonia, continue Rocephin, follow-up on final culture result  Orthostatic hypotension Pt reports symptomatic orthostatic hypotension Continue gentle hydration, will check am cortisol  Repeat orthostatic in am  History of hemolytic anemia, on chronic prednisone CBC stable  Hypothyroidism Continue Synthroid    DVT prophylaxis: apixaban (ELIQUIS) tablet 2.5 mg Start: 01/27/21 1445 SCD's Start: 01/27/21 1357 apixaban (ELIQUIS) tablet 2.5 mg   Code Status:DNR, daughter confirmed over the phone Family Communication: daughter over the phone Disposition:   Status is: Inpatient  Dispo: The patient is from: independent living               Anticipated d/c is to: [facility], [facility] pending therapy eval once medically stable              Anticipated d/c date is:               Patient currently   Consultants:   Neurology  cardiology  Procedures:   None  Antimicrobials:   Rocephin     Objective: Vitals:   01/27/21 1900 01/27/21 2103 01/28/21 0644 01/28/21 0819  BP: (!) 154/76 (!) 174/75 132/64 (!) 149/69  Pulse: (!) 118 (!) 117 (!) 110 (!) 137  Resp: 18 17 17 18   Temp: 98.5 F (36.9 C) 98 F (36.7 C)  97.9 F (36.6 C)  TempSrc: Oral   Oral  SpO2: 98% 99%  98%  Weight:      Height:        Intake/Output Summary (Last 24 hours) at 01/28/2021 1216 Last data filed at 01/28/2021 0600 Gross per 24 hour  Intake -  Output 600  ml  Net -600 ml   Filed Weights   01/27/21 1810  Weight: 64.3 kg    Examination:  General exam: frail, states she is at friends home, states it is 2021, but calm and cooperative Respiratory system: Clear to auscultation. Respiratory effort normal. Cardiovascular system: IRRR, + precordial soft murmur, No pedal edema. Gastrointestinal system: Abdomen is nondistended, soft and nontender. Normal bowel sounds heard. Central nervous system: Slightly confused, slurred speech, right lower  leg weakness Extremities: No edema Skin: chronic venous stasis changes bilateral lower extremity Psychiatry: confused but cooperative,     Data Reviewed: I have personally reviewed following labs and imaging studies  CBC: Recent Labs  Lab 01/27/21 0643 01/27/21 0647  WBC 8.5  --   NEUTROABS 5.8  --   HGB 12.4 13.3  HCT 38.6 39.0  MCV 90.2  --   PLT 204  --     Basic Metabolic Panel: Recent Labs  Lab 01/27/21 0643 01/27/21 0647  NA 132* 134*  K 4.0 4.0  CL 97* 97*  CO2 26  --   GLUCOSE 115* 106*  BUN 18 21  CREATININE 1.04* 1.00  CALCIUM 9.3  --     GFR: Estimated Creatinine Clearance: 32.3 mL/min (by C-G formula based on SCr of 1 mg/dL).  Liver Function Tests: Recent Labs  Lab 01/27/21 0643  AST 22  ALT 17  ALKPHOS 54  BILITOT 1.0  PROT 6.5  ALBUMIN 3.8    CBG: Recent Labs  Lab 01/27/21 0647  GLUCAP 108*     No results found for this or any previous visit (from the past 240 hour(s)).       Radiology Studies: CT HEAD WO CONTRAST  Result Date: 01/27/2021 CLINICAL DATA:  Right-sided weakness with slurred speech EXAM: CT HEAD WITHOUT CONTRAST TECHNIQUE: Contiguous axial images were obtained from the base of the skull through the vertex without intravenous contrast. COMPARISON:  02/19/2017 FINDINGS: Brain: No evidence of acute gray matter infarction, hemorrhage, hydrocephalus, extra-axial collection or mass lesion/mass effect. Confluent chronic small vessel ischemia in the deep cerebral white matter. Increased number of chronic appearing bilateral cerebellar infarcts. Age congruent brain volume Vascular: No hyperdense vessel or unexpected calcification. Skull: Normal. Negative for fracture or focal lesion. Sinuses/Orbits: Bilateral cataract resection. IMPRESSION: 1. No acute finding. 2. Extensive chronic small vessel disease. Electronically Signed   By: Monte Fantasia M.D.   On: 01/27/2021 07:23   MR BRAIN WO CONTRAST  Addendum Date: 01/27/2021    ADDENDUM REPORT: 01/27/2021 12:00 ADDENDUM: These results were called by telephone at the time of interpretation on 01/27/2021 at 12:00 pm to provider [facility] , who verbally acknowledged these results. Electronically Signed   By: Pedro Earls M.D.   On: 01/27/2021 12:00   Result Date: 01/27/2021 CLINICAL DATA:  Stroke suspected. EXAM: MRI HEAD WITHOUT CONTRAST TECHNIQUE: Multiplanar, multiecho pulse sequences of the brain and surrounding structures were obtained without intravenous contrast. COMPARISON:  Head CT January 27, 2021. FINDINGS: Brain: Areas of restricted diffusion within the left cerebellar hemisphere and left caudate body extending into the lenticulocapsular region, consistent with acute infarct. No hemorrhage, hydrocephalus, extra-axial collection or mass lesion. Scattered and confluent foci of T2 hyperintensity are seen within the white matter of the cerebral hemispheres, nonspecific, most likely related to chronic microvascular ischemic changes. Remote lacunar infarcts in the bilateral cerebellar hemispheres. Vascular: Normal flow voids. Skull and upper cervical spine: Normal marrow signal. Sinuses/Orbits: Bilateral lens surgery. Paranasal sinuses are clear. IMPRESSION: 1.  Areas of restricted diffusion within the left cerebellar hemisphere and left caudate body extending into the lenticulocapsular region, consistent with acute infarcts. 2. Remote lacunar infarcts in the bilateral cerebellar hemispheres. 3. Moderate chronic microvascular ischemic changes. Electronically Signed: By: Pedro Earls M.D. On: 01/27/2021 11:54   VAS US CAROTID (at [facility] and [facility] only)  Result Date: 01/27/2021 Carotid Arterial Duplex Study Indications:       CVA. Risk Factors:      Hypertension, hyperlipidemia, coronary artery disease. Comparison Study:  01-22-2017 Prior carotid artery duplex study showed 1-39%                    stenosis bilaterally. Performing Technologist: Darlin Coco  RDMS  Examination Guidelines: A complete evaluation includes B-mode imaging, spectral Doppler, color Doppler, and power Doppler as needed of all accessible portions of each vessel. Bilateral testing is considered an integral part of a complete examination. Limited examinations for reoccurring indications may be performed as noted.  Right Carotid Findings: +----------+--------+--------+--------+------------------+---------------------+           PSV cm/sEDV cm/sStenosisPlaque DescriptionComments              +----------+--------+--------+--------+------------------+---------------------+ CCA Prox  89      16                                                      +----------+--------+--------+--------+------------------+---------------------+ CCA Distal69      16              calcific                                +----------+--------+--------+--------+------------------+---------------------+ ICA Prox  131     31      1-39%   calcific          Velocities may                                                            underestimate degree                                                      of stenosis due to                                                        more proximal                                                             obstruction.          +----------+--------+--------+--------+------------------+---------------------+ ICA Mid  133     25                                                      +----------+--------+--------+--------+------------------+---------------------+ ICA Distal104     23                                                      +----------+--------+--------+--------+------------------+---------------------+ ECA       246             >50%    calcific                                +----------+--------+--------+--------+------------------+---------------------+  +----------+--------+-------+----------------+-------------------+           PSV cm/sEDV cmsDescribe        Arm Pressure (mmHG) +----------+--------+-------+----------------+-------------------+ [id number]            Multiphasic, WNL                    +----------+--------+-------+----------------+-------------------+ +---------+--------+--+--------+--+---------+ VertebralPSV cm/s44EDV cm/s12Antegrade +---------+--------+--+--------+--+---------+  Left Carotid Findings: +----------+--------+--------+--------+------------------+--------+           PSV cm/sEDV cm/sStenosisPlaque DescriptionComments +----------+--------+--------+--------+------------------+--------+ CCA Prox  101     18                                         +----------+--------+--------+--------+------------------+--------+ CCA Distal74      13              calcific                   +----------+--------+--------+--------+------------------+--------+ ICA Prox  140     18      1-39%   calcific                   +----------+--------+--------+--------+------------------+--------+ ICA Mid   126     23                                         +----------+--------+--------+--------+------------------+--------+ ICA Distal104     20                                         +----------+--------+--------+--------+------------------+--------+ ECA       260             >50%    calcific                   +----------+--------+--------+--------+------------------+--------+ +----------+--------+--------+----------------+-------------------+           PSV cm/sEDV cm/sDescribe        Arm Pressure (mmHG) +----------+--------+--------+----------------+-------------------+ [id number]             Multiphasic, WNL                    +----------+--------+--------+----------------+-------------------+ +---------+--------+--+--------+--+---------+ VertebralPSV cm/s97EDV cm/s15Antegrade  +---------+--------+--+--------+--+---------+  Summary: Right Carotid: Velocities in the right ICA are consistent with a 1-39% stenosis.                The ECA appears >50% stenosed. Left Carotid: Velocities in the left ICA are consistent with a 1-39% stenosis.               The ECA appears >50% stenosed. Vertebrals:  Bilateral vertebral arteries demonstrate antegrade flow. Subclavians: Normal flow hemodynamics were seen in bilateral subclavian              arteries. *See table(s) above for measurements and observations.     Preliminary         Scheduled Meds: . ALPRAZolam  0.25 mg Oral QHS  . apixaban  2.5 mg Oral BID  . atorvastatin  20 mg Oral QHS  . gabapentin  100 mg Oral TID  . levothyroxine  88 mcg Oral QAC breakfast  . loratadine  10 mg Oral Daily  . oxybutynin  5 mg Oral QODAY  . predniSONE  5 mg Oral Q breakfast   Continuous Infusions: . sodium chloride 50 mL/hr at 01/27/21 1501     LOS: 1 day   Time spent: 48mins, case discussed with neurology and cardiology Greater than 50% of this time was spent in counseling, explanation of diagnosis, planning of further management, and coordination of care.  I have personally reviewed and interpreted on  01/28/2021 daily labs, tele strips, imagings as discussed above under date review session and assessment and plans.  I reviewed all nursing notes, pharmacy notes, consultant notes,  vitals, pertinent old records  I have discussed plan of care as described above with RN , patient and family on 01/28/2021  Voice Recognition /Dragon dictation system was used to create this note, attempts have been made to correct errors. Please contact the author with questions and/or clarifications.   Florencia Reasons, MD PhD FACP [facility]  Available via Epic secure chat 7am-7pm for nonurgent issues Please page for urgent issues To page the attending provider between 7A-7P or the covering provider during after hours 7P-7A, please log into the web  site [email or web address] and access using universal [place] password for that web site. If you do not have the password, please call the hospital operator.    01/28/2021, 12:16 PM

## 2021-01-28 NOTE — Evaluation (Signed)
Physical Therapy Evaluation Patient Details Name: Carol [name] MRN: [id number] DOB: 02/23/1928 Today's Date: 01/28/2021   History of Present Illness  Pt is a 87 y.o. female who presents with R leg weakness and slurred speech. 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 PMH: acute blood loss anemia, autoimmune hemoyltic anemia, a-fib on anticoagulation, heart disease, chronic diastolic heart failure, spinal stenosis, sciatica, s/p AVR and CABG, dyspnea, CAD, aortic stenosis, HTN, and hypothyroidism.  Clinical Impression  Pt presents with condition above and deficits mentioned below, see PT Problem List. Per pt, she was independent with all functional mobility without an AD/AE living at an independent living facility. However, pt may be a poor historian as she displays STM, attention, processing, awareness, and sequencing cognitive deficits. Pt also displays a L gaze, R neglect, and possible ataxic movements in her R side along with deficits in R leg strength, R leg coordination, and overall balance that impact her independence and safety with all functional mobility and place her at high risk for falls and injuries. She required modA for all functional mobility this date. Will continue to follow acutely. Pt would benefit from skilled PT at a SNF following d/c to maximize her independence and safety with all functional mobility to decrease her risk for falls and injuries.    Follow Up Recommendations SNF;Supervision/Assistance - 24 hour    Equipment Recommendations  Rolling walker with 5" wheels;3in1 (PT);Wheelchair (measurements PT);Wheelchair cushion (measurements PT);Hospital bed    Recommendations for Other Services       Precautions / Restrictions Precautions Precautions: Fall Precaution Comments: R neglect; monitor HR; orthostatic hypotension; Contact and airborne precautions; HOH Restrictions Weight Bearing  Restrictions: No      Mobility  Bed Mobility Overal bed mobility: Needs Assistance Bed Mobility: Supine to Sit     Supine to sit: HOB elevated;Mod assist     General bed mobility comments: Extra time and cues to initiate movement of legs off L EOB. Pt neglecting R side and not attempting to grab rail with R hand or fully bring R leg off bed until max cues provided with physical assistance. [place] for R HHA to ascend trunk and to bring R leg off edge of bed.    Transfers Overall transfer level: Needs assistance Equipment used: Rolling walker (2 wheeled) Transfers: Sit to/from [facility] Sit to Stand: Mod assist Stand pivot transfers: Mod assist       General transfer comment: Cued pt for hand placement, with pt neglecting R hand placement on RW needing physical assistance for placement. ModA and extra time to power up to stand and then direct buttocks to chair towards L as she would avoid stepping with R foot and decided to try to sit prematurely.  Ambulation/Gait Ambulation/Gait assistance: Mod assist Gait Distance (Feet): 1 Feet Assistive device: Rolling walker (2 wheeled) Gait Pattern/deviations: Step-to pattern;Decreased step length - right;Decreased stance time - right;Decreased stance time - left;Decreased weight shift to right;Decreased weight shift to left;Decreased stride length;Ataxic;Shuffle Gait velocity: reduced Gait velocity interpretation: <1.31 ft/sec, indicative of household ambulator General Gait Details: Pt takes step with L foot but displayed ataxic movements/tremors at R leg and inability to take step despite max cues, needing modA to direct pt to chair to maintain safety. Assistance to place R hand on RW as she completely neglects it.  Stairs            Wheelchair Mobility    Modified Rankin (  Stroke Patients Only) Modified Rankin (Stroke Patients Only) Pre-Morbid Rankin Score: Slight disability Modified Rankin: Moderately severe  disability     Balance Overall balance assessment: Needs assistance Sitting-balance support: Bilateral upper extremity supported;Feet supported Sitting balance-Leahy Scale: Poor Sitting balance - Comments: Bil UE support and min guard-minA for balance sitting statically EOB.   Standing balance support: Bilateral upper extremity supported Standing balance-Leahy Scale: Poor Standing balance comment: Bil UE support and external support for static standing balance.                             Pertinent Vitals/Pain Pain Assessment: 0-10 Pain Score: 5  Pain Location: legs Pain Descriptors / Indicators: Cramping;Grimacing;Guarding Pain Intervention(s): Limited activity within patient's tolerance;Monitored during session;Repositioned    Home Living Family/patient expects to be discharged to:: Assisted living               Home Equipment: Walker - 4 wheels;Shower seat - built in;Grab bars - tub/shower;Grab bars - toilet Additional Comments: Pt reports living at an independent living facility and having a standard bed, handicap hight toilet, and walk-in shower.    Prior Function Level of Independence: Independent         Comments: Pt reports being independent with all mobility without AD/AE. (Pt may be poor historian?)     Hand Dominance   Dominant Hand: Right    Extremity/Trunk Assessment   Upper Extremity Assessment Upper Extremity Assessment: Defer to OT evaluation    Lower Extremity Assessment Lower Extremity Assessment: RLE deficits/detail;LLE deficits/detail RLE Deficits / Details: Generalized weakness and neglect of R side, not attempting to take step with R despite cues RLE Coordination: decreased fine motor;decreased gross motor (possible dysdiadochokinesia and ataxia noted) LLE Coordination: decreased gross motor    Cervical / Trunk Assessment Cervical / Trunk Assessment: Kyphotic  Communication   Communication: Expressive difficulties  (dysarthria)  Cognition Arousal/Alertness: Lethargic Behavior During Therapy: Anxious Overall Cognitive Status: Impaired/Different from baseline Area of Impairment: Orientation;Attention;Memory;Following commands;Safety/judgement;Problem solving;Awareness                 Orientation Level: Disoriented to;Place;Situation Current Attention Level: Selective Memory: Decreased recall of precautions;Decreased short-term memory Following Commands: Follows one step commands consistently;Follows one step commands with increased time Safety/Judgement: Decreased awareness of safety;Decreased awareness of deficits Awareness: Intellectual Problem Solving: Slow processing;Decreased initiation;Difficulty sequencing;Requires verbal cues;Requires tactile cues General Comments: Pt disoriented to place and situation, needing repeated re-orientation and assurance that everything was fine. Pt with R side neglect despite cues to attend to R side. Needs cues to initiate and sequence all tasks. Premature attempt to sit before fully in front of chair, needing modA to guide buttocks to chair to maintain safety.      General Comments General comments (skin integrity, edema, etc.): HR rose up to 140-150s with standing to transfer to chair; pt reports dizziness upon coming to sit with BP drop to 93/63 following transfer to chair, improved to 109/62 once reclined, RN and MD notified    Exercises     Assessment/Plan    PT Assessment Patient needs continued PT services  PT Problem List Decreased strength;Decreased range of motion;Decreased activity tolerance;Decreased balance;Decreased mobility;Decreased coordination;Decreased cognition;Decreased knowledge of use of DME;Decreased safety awareness;Decreased knowledge of precautions;Cardiopulmonary status limiting activity       PT Treatment Interventions DME instruction;Gait training;Functional mobility training;Therapeutic activities;Therapeutic exercise;Balance  training;Neuromuscular re-education;Cognitive remediation;Patient/family education    PT Goals (Current goals can be found in the Care  Plan section)  Acute Rehab PT Goals Patient Stated Goal: to stop the dizziness PT Goal Formulation: With patient Time For Goal Achievement: 02/11/21 Potential to Achieve Goals: Fair    Frequency Min 3X/week   Barriers to discharge        Co-evaluation               AM-PAC PT "6 Clicks" Mobility  Outcome Measure Help needed turning from your back to your side while in a flat bed without using bedrails?: A Lot Help needed moving from lying on your back to sitting on the side of a flat bed without using bedrails?: A Lot Help needed moving to and from a bed to a chair (including a wheelchair)?: A Lot Help needed standing up from a chair using your arms (e.g., wheelchair or bedside chair)?: A Lot Help needed to walk in hospital room?: A Lot Help needed climbing 3-5 steps with a railing? : Total 6 Click Score: 11    End of Session Equipment Utilized During Treatment: Gait belt Activity Tolerance: Patient limited by fatigue;Treatment limited secondary to medical complications (Comment) (BP and HR) Patient left: in chair;with call bell/phone within reach;with chair alarm set Nurse Communication: Mobility status;Other (comment) (HR and BP changes) PT Visit Diagnosis: Unsteadiness on feet (R26.81);Other abnormalities of gait and mobility (R26.89);Muscle weakness (generalized) (M62.81);Ataxic gait (R26.0);Difficulty in walking, not elsewhere classified (R26.2);Other symptoms and signs involving the nervous system (R29.898);Dizziness and giddiness (R42);Hemiplegia and hemiparesis Hemiplegia - Right/Left: Right Hemiplegia - dominant/non-dominant: Dominant Hemiplegia - caused by: Cerebral infarction    Time: 1253-1330 PT Time Calculation (min) (ACUTE ONLY): 37 min   Charges:   PT Evaluation $PT Eval Moderate Complexity: 1 Mod PT  Treatments $Therapeutic Activity: 8-22 mins        Moishe Spice, PT, DPT Acute Rehabilitation Services  Pager: [phone] Office: [phone]   Orvan Falconer 01/28/2021, 2:24 PM

## 2021-01-29 ENCOUNTER — Inpatient Hospital Stay (HOSPITAL_COMMUNITY): Payer: Medicare Other

## 2021-01-29 DIAGNOSIS — I4821 Permanent atrial fibrillation: Secondary | ICD-10-CM

## 2021-01-29 DIAGNOSIS — I6389 Other cerebral infarction: Secondary | ICD-10-CM | POA: Diagnosis not present

## 2021-01-29 DIAGNOSIS — I10 Essential (primary) hypertension: Secondary | ICD-10-CM | POA: Diagnosis not present

## 2021-01-29 DIAGNOSIS — I639 Cerebral infarction, unspecified: Secondary | ICD-10-CM | POA: Diagnosis not present

## 2021-01-29 LAB — URINE CULTURE: Culture: >=100000 — AB

## 2021-01-29 LAB — BASIC METABOLIC PANEL
Anion gap: 8 (ref 5–15)
BUN: 20 mg/dL (ref 8–23)
CO2: 25 mmol/L (ref 22–32)
Calcium: 8.8 mg/dL — ABNORMAL LOW (ref 8.9–10.3)
Chloride: 98 mmol/L (ref 98–111)
Creatinine, Ser: 1 mg/dL (ref 0.44–1.00)
GFR, Estimated: 53 mL/min — ABNORMAL LOW (ref >60)
Glucose, Bld: 108 mg/dL — ABNORMAL HIGH (ref 70–99)
Potassium: 3.8 mmol/L (ref 3.5–5.1)
Sodium: 131 mmol/L — ABNORMAL LOW (ref 135–145)

## 2021-01-29 LAB — ECHOCARDIOGRAM COMPLETE
AR max vel: 0.59 cm2
AV Area VTI: 0.53 cm2
AV Area mean vel: 0.57 cm2
AV Mean grad: 14.3 mmHg
AV Peak grad: 23.9 mmHg
Ao pk vel: 2.44 m/s
Area-P 1/2: 6.71 cm2
Height: 62 in
Weight: 2268.09 oz

## 2021-01-29 LAB — CORTISOL: Cortisol, Plasma: 19.1 ug/dL

## 2021-01-29 LAB — MAGNESIUM: Magnesium: 2.1 mg/dL (ref 1.7–2.4)

## 2021-01-29 MED ORDER — SENNA 8.6 MG PO TABS
2.0000 | ORAL_TABLET | Freq: Every day | ORAL | Status: DC
  Administered 2021-01-29 – 2021-02-06 (×8): 17.2 mg via ORAL
  Filled 2021-01-29 (×8): qty 2

## 2021-01-29 MED ORDER — SODIUM CHLORIDE 0.9 % IV SOLN: INTRAVENOUS | Status: DC

## 2021-01-29 MED ORDER — DOCUSATE SODIUM 100 MG PO CAPS
100.0000 mg | ORAL_CAPSULE | Freq: Every day | ORAL | Status: DC
  Administered 2021-01-29: 100 mg via ORAL
  Filled 2021-01-29: qty 1

## 2021-01-29 MED ORDER — POLYETHYLENE GLYCOL 3350 17 G PO PACK
17.0000 g | PACK | Freq: Every day | ORAL | Status: DC | PRN
  Administered 2021-01-29 – 2021-01-30 (×2): 17 g via ORAL
  Filled 2021-01-29 (×2): qty 1

## 2021-01-29 MED ORDER — LOSARTAN POTASSIUM 50 MG PO TABS: 50.0000 mg | ORAL_TABLET | Freq: Every day | ORAL | Status: DC

## 2021-01-29 MED ORDER — SODIUM CHLORIDE 0.9 % IV BOLUS
500.0000 mL | Freq: Once | INTRAVENOUS | Status: AC
  Administered 2021-01-29: 500 mL via INTRAVENOUS

## 2021-01-29 MED ORDER — NYSTATIN 100000 UNIT/GM EX POWD
Freq: Two times a day (BID) | CUTANEOUS | Status: DC
  Filled 2021-01-29: qty 15

## 2021-01-29 NOTE — Progress Notes (Signed)
Progress Note  Patient Name: Carol [name] Date of Encounter: 01/29/2021  Primary Cardiologist: Jenean Lindau, MD   Subjective   NAEO. HR 110s AF.  Inpatient Medications    Scheduled Meds: . ALPRAZolam  0.25 mg Oral QHS  . apixaban  5 mg Oral BID  . atorvastatin  20 mg Oral QHS  . gabapentin  100 mg Oral TID  . levothyroxine  88 mcg Oral QAC breakfast  . loratadine  10 mg Oral Daily  . metoprolol tartrate  25 mg Oral Daily  . oxybutynin  5 mg Oral QODAY  . predniSONE  5 mg Oral Q breakfast   Continuous Infusions: . sodium chloride 50 mL/hr at 01/27/21 1501   PRN Meds: acetaminophen **OR** acetaminophen (TYLENOL) oral liquid 160 mg/5 mL **OR** acetaminophen, metoprolol tartrate   Vital Signs    Vitals:   01/28/21 2002 01/29/21 0006 01/29/21 0418 01/29/21 0833  BP: 137/89 (!) 136/54 (!) 147/86 (!) 161/92  Pulse: 94 (!) 106 (!) 107 (!) 116  Resp: 16 17 16 20   Temp: 99.4 F (37.4 C) 98 F (36.7 C) 99.3 F (37.4 C) 98.1 F (36.7 C)  TempSrc: Oral Oral Oral Oral  SpO2: 99% 97% 99% 97%  Weight:      Height:        Intake/Output Summary (Last 24 hours) at 01/29/2021 1106 Last data filed at 01/29/2021 0700 Gross per 24 hour  Intake 348.77 ml  Output 300 ml  Net 48.77 ml   Filed Weights   01/27/21 1810  Weight: 64.3 kg    Telemetry    AF - Personally Reviewed  ECG    No new - Personally Reviewed  Physical Exam   GEN: No acute distress.   Neck: No JVD Cardiac: RRR, no murmurs, rubs, or gallops.  Respiratory: Clear to auscultation bilaterally. GI: Soft, nontender, non-distended  MS: No edema; No deformity. Neuro:  slurred speech, weakness. Speech improving. Psych: Normal affect   Labs    Chemistry Recent Labs  Lab 01/27/21 0643 01/27/21 0647 01/29/21 0202  NA 132* 134* 131*  K 4.0 4.0 3.8  CL 97* 97* 98  CO2 26  --  25  GLUCOSE 115* 106* 108*  BUN 18 21 20   CREATININE 1.04* 1.00 1.00  CALCIUM 9.3  --  8.8*  PROT 6.5  --   --    ALBUMIN 3.8  --   --   AST 22  --   --   ALT 17  --   --   ALKPHOS 54  --   --   BILITOT 1.0  --   --   GFRNONAA 51*  --  53*  ANIONGAP 9  --  8     Hematology Recent Labs  Lab 01/27/21 0643 01/27/21 0647  WBC 8.5  --   RBC 4.28  --   HGB 12.4 13.3  HCT 38.6 39.0  MCV 90.2  --   MCH 29.0  --   MCHC 32.1  --   RDW 18.2*  --   PLT 204  --     Cardiac EnzymesNo results for input(s): TROPONINI in the last 168 hours. No results for input(s): TROPIPOC in the last 168 hours.   BNPNo results for input(s): BNP, PROBNP in the last 168 hours.   DDimer No results for input(s): DDIMER in the last 168 hours.   Radiology    CT ANGIO HEAD W OR WO CONTRAST  Result Date: 01/28/2021 CLINICAL DATA:  Stroke. TIA. Left cerebellar and basal ganglia strokes. EXAM: CT ANGIOGRAPHY HEAD AND NECK TECHNIQUE: Multidetector CT imaging of the head and neck was performed using the standard protocol during bolus administration of intravenous contrast. Multiplanar CT image reconstructions and MIPs were obtained to evaluate the vascular anatomy. Carotid stenosis measurements (when applicable) are obtained utilizing NASCET criteria, using the distal internal carotid diameter as the denominator. CONTRAST:  4mL OMNIPAQUE IOHEXOL 350 MG/ML SOLN COMPARISON:  CT and MRI studies done yesterday. FINDINGS: CT HEAD FINDINGS Brain: Focal low-density now evident in the left cerebellum. No evidence of hemorrhagic transformation. Cerebral hemispheres show a background pattern of extensive chronic small vessel disease of the white matter. The acute infarction in the left basal ganglia and radiating white matter tracts is barely discernible. No hemorrhagic transformation. No new insult. No hydrocephalus or extra-axial collection. Vascular: There is atherosclerotic calcification of the major vessels at the base of the brain. Skull: Negative Sinuses: Clear Orbits: Normal Review of the MIP images confirms the above findings CTA NECK  FINDINGS Aortic arch: Aortic atherosclerotic calcification. Branching pattern is normal without flow limiting stenosis. Right carotid system: Common carotid artery shows atherosclerotic plaque but without flow limiting stenosis proximal to the bifurcation. Dense calcified plaque at the carotid bifurcation and ICA bulb. Minimal diameter in the ICA bulb is 1.5 mm. Compared to a more distal cervical ICA of 5 mm, this indicates a 70% stenosis. Left carotid system: Common carotid artery shows scattered plaque but is sufficiently patent to the bifurcation region. Dense calcified plaque at the carotid bifurcation and ICA bulb. Minimal diameter in the ICA bulb is 2.5 mm. Compared to a more distal cervical ICA diameter of 5 mm, this indicates a 50% stenosis. Vertebral arteries: Both subclavian arteries show atherosclerotic disease but without a flow limiting stenosis. Both vertebral arteries are patent with stenosis of 50% at their origins. Beyond that, the vessels are patent through the cervical region to the foramen magnum. Skeleton: Ordinary cervical spondylosis. Chronic arthropathy at the C1-2 articulation. Other neck: No mass or lymphadenopathy in the neck. Upper chest: Mild scarring at the lung apices.  No active process. Review of the MIP images confirms the above findings CTA HEAD FINDINGS Anterior circulation: Both internal carotid arteries are patent through the skull base and siphon regions. There is calcification in the carotid siphon regions with stenosis estimated at 30% on both sides. The anterior and middle cerebral vessels are patent without proximal stenosis, aneurysm or vascular malformation. No large or medium vessel occlusion identified. Posterior circulation: Both vertebral arteries are patent through the foramen magnum to the basilar. Focal plaque in the left vertebral V4 segment with stenosis of 30%. No basilar stenosis. Major posterior circulation branch vessels are patent. Left PCA receives its  supply from the anterior circulation. Venous sinuses: Patent and normal. Anatomic variants: None Review of the MIP images confirms the above findings IMPRESSION: 1. Dense calcified plaque at both carotid bifurcations and ICA bulb regions. 70% stenosis of the ICA bulb on the right. 50% stenosis of the ICA bulb on the left. 2. 50% stenoses at both vertebral artery origins. 30% stenosis of the left vertebral V4 segment. 3. No intracranial large or medium vessel occlusion or correctable proximal stenosis. Aortic Atherosclerosis (ICD10-I70.0). Electronically Signed   By: Nelson Chimes M.D.   On: 01/28/2021 19:56   CT ANGIO NECK W OR WO CONTRAST  Result Date: 01/28/2021 CLINICAL DATA:  Stroke. TIA. Left cerebellar and basal ganglia strokes. EXAM: CT ANGIOGRAPHY HEAD AND NECK  TECHNIQUE: Multidetector CT imaging of the head and neck was performed using the standard protocol during bolus administration of intravenous contrast. Multiplanar CT image reconstructions and MIPs were obtained to evaluate the vascular anatomy. Carotid stenosis measurements (when applicable) are obtained utilizing NASCET criteria, using the distal internal carotid diameter as the denominator. CONTRAST:  12mL OMNIPAQUE IOHEXOL 350 MG/ML SOLN COMPARISON:  CT and MRI studies done yesterday. FINDINGS: CT HEAD FINDINGS Brain: Focal low-density now evident in the left cerebellum. No evidence of hemorrhagic transformation. Cerebral hemispheres show a background pattern of extensive chronic small vessel disease of the white matter. The acute infarction in the left basal ganglia and radiating white matter tracts is barely discernible. No hemorrhagic transformation. No new insult. No hydrocephalus or extra-axial collection. Vascular: There is atherosclerotic calcification of the major vessels at the base of the brain. Skull: Negative Sinuses: Clear Orbits: Normal Review of the MIP images confirms the above findings CTA NECK FINDINGS Aortic arch: Aortic  atherosclerotic calcification. Branching pattern is normal without flow limiting stenosis. Right carotid system: Common carotid artery shows atherosclerotic plaque but without flow limiting stenosis proximal to the bifurcation. Dense calcified plaque at the carotid bifurcation and ICA bulb. Minimal diameter in the ICA bulb is 1.5 mm. Compared to a more distal cervical ICA of 5 mm, this indicates a 70% stenosis. Left carotid system: Common carotid artery shows scattered plaque but is sufficiently patent to the bifurcation region. Dense calcified plaque at the carotid bifurcation and ICA bulb. Minimal diameter in the ICA bulb is 2.5 mm. Compared to a more distal cervical ICA diameter of 5 mm, this indicates a 50% stenosis. Vertebral arteries: Both subclavian arteries show atherosclerotic disease but without a flow limiting stenosis. Both vertebral arteries are patent with stenosis of 50% at their origins. Beyond that, the vessels are patent through the cervical region to the foramen magnum. Skeleton: Ordinary cervical spondylosis. Chronic arthropathy at the C1-2 articulation. Other neck: No mass or lymphadenopathy in the neck. Upper chest: Mild scarring at the lung apices.  No active process. Review of the MIP images confirms the above findings CTA HEAD FINDINGS Anterior circulation: Both internal carotid arteries are patent through the skull base and siphon regions. There is calcification in the carotid siphon regions with stenosis estimated at 30% on both sides. The anterior and middle cerebral vessels are patent without proximal stenosis, aneurysm or vascular malformation. No large or medium vessel occlusion identified. Posterior circulation: Both vertebral arteries are patent through the foramen magnum to the basilar. Focal plaque in the left vertebral V4 segment with stenosis of 30%. No basilar stenosis. Major posterior circulation branch vessels are patent. Left PCA receives its supply from the anterior  circulation. Venous sinuses: Patent and normal. Anatomic variants: None Review of the MIP images confirms the above findings IMPRESSION: 1. Dense calcified plaque at both carotid bifurcations and ICA bulb regions. 70% stenosis of the ICA bulb on the right. 50% stenosis of the ICA bulb on the left. 2. 50% stenoses at both vertebral artery origins. 30% stenosis of the left vertebral V4 segment. 3. No intracranial large or medium vessel occlusion or correctable proximal stenosis. Aortic Atherosclerosis (ICD10-I70.0). Electronically Signed   By: Nelson Chimes M.D.   On: 01/28/2021 19:56   MR BRAIN WO CONTRAST  Addendum Date: 01/27/2021   ADDENDUM REPORT: 01/27/2021 12:00 ADDENDUM: These results were called by telephone at the time of interpretation on 01/27/2021 at 12:00 pm to provider JOSHUA ZAVITZ , who verbally acknowledged these results. Electronically  Signed   By: Pedro Earls M.D.   On: 01/27/2021 12:00   Result Date: 01/27/2021 CLINICAL DATA:  Stroke suspected. EXAM: MRI HEAD WITHOUT CONTRAST TECHNIQUE: Multiplanar, multiecho pulse sequences of the brain and surrounding structures were obtained without intravenous contrast. COMPARISON:  Head CT January 27, 2021. FINDINGS: Brain: Areas of restricted diffusion within the left cerebellar hemisphere and left caudate body extending into the lenticulocapsular region, consistent with acute infarct. No hemorrhage, hydrocephalus, extra-axial collection or mass lesion. Scattered and confluent foci of T2 hyperintensity are seen within the white matter of the cerebral hemispheres, nonspecific, most likely related to chronic microvascular ischemic changes. Remote lacunar infarcts in the bilateral cerebellar hemispheres. Vascular: Normal flow voids. Skull and upper cervical spine: Normal marrow signal. Sinuses/Orbits: Bilateral lens surgery. Paranasal sinuses are clear. IMPRESSION: 1. Areas of restricted diffusion within the left cerebellar hemisphere and  left caudate body extending into the lenticulocapsular region, consistent with acute infarcts. 2. Remote lacunar infarcts in the bilateral cerebellar hemispheres. 3. Moderate chronic microvascular ischemic changes. Electronically Signed: By: Pedro Earls M.D. On: 01/27/2021 11:54   VAS US CAROTID (at [facility] and [facility] only)  Result Date: 01/27/2021 Carotid Arterial Duplex Study Indications:       CVA. Risk Factors:      Hypertension, hyperlipidemia, coronary artery disease. Comparison Study:  01-22-2017 Prior carotid artery duplex study showed 1-39%                    stenosis bilaterally. Performing Technologist: Darlin Coco RDMS  Examination Guidelines: A complete evaluation includes B-mode imaging, spectral Doppler, color Doppler, and power Doppler as needed of all accessible portions of each vessel. Bilateral testing is considered an integral part of a complete examination. Limited examinations for reoccurring indications may be performed as noted.  Right Carotid Findings: +----------+--------+--------+--------+------------------+---------------------+           PSV cm/sEDV cm/sStenosisPlaque DescriptionComments              +----------+--------+--------+--------+------------------+---------------------+ CCA Prox  89      16                                                      +----------+--------+--------+--------+------------------+---------------------+ CCA Distal69      16              calcific                                +----------+--------+--------+--------+------------------+---------------------+ ICA Prox  131     31      1-39%   calcific          Velocities may                                                            underestimate degree  of stenosis due to                                                        more proximal                                                              obstruction.          +----------+--------+--------+--------+------------------+---------------------+ ICA Mid   133     25                                                      +----------+--------+--------+--------+------------------+---------------------+ ICA Distal104     23                                                      +----------+--------+--------+--------+------------------+---------------------+ ECA       246             >50%    calcific                                +----------+--------+--------+--------+------------------+---------------------+ +----------+--------+-------+----------------+-------------------+           PSV cm/sEDV cmsDescribe        Arm Pressure (mmHG) +----------+--------+-------+----------------+-------------------+ [id number]            Multiphasic, WNL                    +----------+--------+-------+----------------+-------------------+ +---------+--------+--+--------+--+---------+ VertebralPSV cm/s44EDV cm/s12Antegrade +---------+--------+--+--------+--+---------+  Left Carotid Findings: +----------+--------+--------+--------+------------------+--------+           PSV cm/sEDV cm/sStenosisPlaque DescriptionComments +----------+--------+--------+--------+------------------+--------+ CCA Prox  101     18                                         +----------+--------+--------+--------+------------------+--------+ CCA Distal74      13              calcific                   +----------+--------+--------+--------+------------------+--------+ ICA Prox  140     18      1-39%   calcific                   +----------+--------+--------+--------+------------------+--------+ ICA Mid   126     23                                         +----------+--------+--------+--------+------------------+--------+ ICA Distal104     20                                          +----------+--------+--------+--------+------------------+--------+  ECA       260             >50%    calcific                   +----------+--------+--------+--------+------------------+--------+ +----------+--------+--------+----------------+-------------------+           PSV cm/sEDV cm/sDescribe        Arm Pressure (mmHG) +----------+--------+--------+----------------+-------------------+ [id number]             Multiphasic, WNL                    +----------+--------+--------+----------------+-------------------+ +---------+--------+--+--------+--+---------+ VertebralPSV cm/s97EDV cm/s15Antegrade +---------+--------+--+--------+--+---------+   Summary: Right Carotid: Velocities in the right ICA are consistent with a 1-39% stenosis.                The ECA appears >50% stenosed. Left Carotid: Velocities in the left ICA are consistent with a 1-39% stenosis.               The ECA appears >50% stenosed. Vertebrals:  Bilateral vertebral arteries demonstrate antegrade flow. Subclavians: Normal flow hemodynamics were seen in bilateral subclavian              arteries. *See table(s) above for measurements and observations.     Preliminary     Cardiac Studies   No new    Assessment & Plan    Ms. Cente is a 87 year old woman with a history of coronary artery disease post CABG and paroxysmal atrial fibrillation and flutter.  She was admitted with a stroke yesterday.  Her A. fib is currently not rate controlled due to her metoprolol being held.    1.  Atrial fibrillation with rapid ventricular rates  Continue metoprolol 25 mg by mouth once daily.  Recommend Eliquis 5 mg by mouth twice daily for secondary prophylaxis of stroke.      For questions or updates, please contact [place] Please consult [email or web address] for contact info under Cardiology/STEMI.      Signed, Lars Mage, MD  01/29/2021, 11:06 AM

## 2021-01-29 NOTE — Progress Notes (Addendum)
STROKE TEAM PROGRESS NOTE   INTERVAL HISTORY Her granddaughter is at the bedside.  Pt lying in bed, no acute event overnight. No complains, she still has dysarthria and right sided hemiparesis. Now on eliquis 5mg  bid and metoprolol. Still has mild RVR but no low BP.   OBJECTIVE Vitals:   01/28/21 1800 01/28/21 2002 01/29/21 0006 01/29/21 0418  BP: (!) 177/87 137/89 (!) 136/54 (!) 147/86  Pulse: (!) 110 94 (!) 106 (!) 107  Resp: 20 16 17 16   Temp: 97.9 F (36.6 C) 99.4 F (37.4 C) 98 F (36.7 C) 99.3 F (37.4 C)  TempSrc:  Oral Oral Oral  SpO2: 100% 99% 97% 99%  Weight:      Height:        CBC:  Recent Labs  Lab 01/27/21 0643 01/27/21 0647  WBC 8.5  --   NEUTROABS 5.8  --   HGB 12.4 13.3  HCT 38.6 39.0  MCV 90.2  --   PLT 204  --     Basic Metabolic Panel:  Recent Labs  Lab 01/27/21 0643 01/27/21 0647 01/29/21 0202  NA 132* 134* 131*  K 4.0 4.0 3.8  CL 97* 97* 98  CO2 26  --  25  GLUCOSE 115* 106* 108*  BUN 18 21 20   CREATININE 1.04* 1.00 1.00  CALCIUM 9.3  --  8.8*  MG  --   --  2.1    Lipid Panel:     Component Value Date/Time   CHOL 123 01/28/2021 0301   TRIG 102 01/28/2021 0301   HDL 55 01/28/2021 0301   CHOLHDL 2.2 01/28/2021 0301   VLDL 20 01/28/2021 0301   LDLCALC 48 01/28/2021 0301   HgbA1c:  Lab Results  Component Value Date   HGBA1C 4.5 (L) 01/28/2021   Urine Drug Screen: No results found for: LABOPIA, COCAINSCRNUR, LABBENZ, AMPHETMU, THCU, LABBARB  Alcohol Level No results found for: ETH  IMAGING  CT ANGIO HEAD W OR WO CONTRAST CT ANGIO NECK W OR WO CONTRAST 01/28/2021 IMPRESSION:  1. Dense calcified plaque at both carotid bifurcations and ICA bulb regions. 70% stenosis of the ICA bulb on the right. 50% stenosis of the ICA bulb on the left.  2. 50% stenoses at both vertebral artery origins. 30% stenosis of the left vertebral V4 segment.  3. No intracranial large or medium vessel occlusion or correctable proximal stenosis.  Aortic  Atherosclerosis (ICD10-I70.0).   CT HEAD WO CONTRAST 01/27/2021 IMPRESSION:  1. No acute finding.  2. Extensive chronic small vessel disease.   MR BRAIN WO CONTRAST 01/27/2021   IMPRESSION:  1. Areas of restricted diffusion within the left cerebellar hemisphere and left caudate body extending into the lenticulocapsular region, consistent with acute infarcts.  2. Remote lacunar infarcts in the bilateral cerebellar hemispheres.  3. Moderate chronic microvascular ischemic changes.   VAS US CAROTID (at [facility] and [facility] only) 01/27/2021 Summary:  Right Carotid: Velocities in the right ICA are consistent with a 1-39% stenosis. The ECA appears >50% stenosed.  Left Carotid: Velocities in the left ICA are consistent with a 1-39% stenosis. The ECA appears >50% stenosed.  Vertebrals:  Bilateral vertebral arteries demonstrate antegrade flow.  Subclavians: Normal flow hemodynamics were seen in bilateral subclavian arteries.  Preliminary    Transthoracic Echocardiogram  01/29/2021 1. Left ventricular ejection fraction, by estimation, is 60 to 65%. The  left ventricle has normal function. The left ventricle has no regional  wall motion abnormalities. Left ventricular diastolic function could not  be evaluated.  2. Right ventricular systolic function is normal. The right ventricular  size is normal. There is normal pulmonary artery systolic pressure.  3. The mitral valve is abnormal. Trivial mitral valve regurgitation.  Moderate mitral annular calcification.  4. The aortic valve has been repaired/replaced. Aortic valve  regurgitation is trivial. There is a 19 mm Edwards MagnaEase valve present  in the aortic position. Echo findings are consistent with normal structure  and function of the aortic valve  prosthesis. Aortic valve mean gradient measures 14.3 mmHg. Aortic valve  Vmax measures 2.44 m/s.  5. The inferior vena cava is normal in size with greater than 50%  respiratory variability, suggesting  right atrial pressure of 3 mmHg.   ECG - ST rate 114 BPM. (See cardiology reading for complete details)  PHYSICAL EXAM Blood pressure (!) 147/86, pulse (!) 107, temperature 99.3 F (37.4 C), temperature source Oral, resp. rate 16, height 5\' 2"  (1.575 m), weight 64.3 kg, SpO2 99 %.  General - Well nourished, well developed, in no apparent distress, mildly lethargic.  Ophthalmologic - fundi not visualized due to noncooperation.  Cardiovascular - irregularly irregular heart rate and rhythm.  Neuro - awake alert, eyes open, able to follow simple commands, no aphasia but paucity of speech, able to name 2/4 with perseveration, able to repeat.  Seem to have right hemianopia, and left gaze preference, but able to have right gaze.  Right facial droop, tongue protrusion to the right.  Bilateral chronic shoulder injury 3-/5 deltoid bilaterally. However, left bicep and tricep with finger grip 4/5, right bicep tricep and finger grip 3/5.  Left lower extremity proximal 3/5 with knee flexion and DF/PF 4/5.  Right lower extremity proximal 2/5 with knee flexion and DF/PF 3/5.  Sensation symmetrical subjectively.  Bilateral finger-to-nose intact.  Gait not tested.  ASSESSMENT/PLAN Carol [name] is a 87 y.o. female with history of atrial fibrillation (on Eliquis 2.5 PTA), AV heart block, HTN, HLD, AS s/p AVR (tissue valve) and CAD s/p CABG in 2013, chronic dCHF, R CRAO, short term memory problems and autoimmune hemolytic anemia who presented to the [facility] ED 01/27/21 for evaluation of R-sided weakness and dysarthria which started in the afternoon of 01/26/21.. She did not receive IV t-PA due to late presentation (>4.5 hours from time of onset) as well as anticoagulation with Eliquis.  Stroke: acute infarct within the left cerebellar hemisphere and left caudate body extending into the lenticulocapsular region - likely cardioembolic due to atrial fibrillation on the subtherapeutic dose of eliquis  CT head - No  acute finding. Extensive chronic small vessel disease.   MRI head - Areas of restricted diffusion within the left cerebellar hemisphere and left caudate body extending into the lenticulocapsular region, consistent with acute infarcts. Remote lacunar infarcts in the bilateral cerebellar hemispheres.   CTA head and neck - Dense calcified plaque at both carotid bifurcations and ICA bulb regions. 70% stenosis of the ICA bulb on the right. 50% stenosis of the ICA bulb on the left. 50% stenoses at both vertebral artery origins. 30% stenosis of the left vertebral V4 segment.   Carotid Doppler - unremarkable  2D Echo - EF 60-65%  Sars Corona Virus 2 neg  LDL - 48  HgbA1c - 4.5  VTE prophylaxis - Eliquis  Eliquis (apixaban) 2.5mg  bid prior to admission, now on Eliquis (apixaban) 5mg  bid, continue on discharge.  Ongoing aggressive stroke risk factor management  Therapy recommendations:  SNF    Disposition:  Pending  Chronic AF with RVR  Rate fluctuate with intermittent RVR  Cardiology on board  Now on metoprolol 25 home dose  On Eliquis 5 twice daily  Carotid stenosis, bilateral  CTA head and neck - Dense calcified plaque at both carotid bifurcations and ICA bulb regions. 70% stenosis of the ICA bulb on the right. 50% stenosis of the ICA bulb on the left.   Carotid Doppler - unremarkable  Asymptomatic, not able to explain the current stroke  No intervention needed at this time  May consider outpt follow up with [facility] for monitoring  Hypertension  Home BP meds: metoprolol ; Losartan  On metoprolol 25  Stable on the high end  Gradually normalized BP in 2-3 days  Long-term BP goal normotensive  Hyperlipidemia  Home Lipid lowering medication: Lipitor 20 mg daily  LDL 48, goal < 70  Current lipid lowering medication: Lipitor 20 mg daily  Note high intensity statin not needed given advanced age and LDL at goal with low intensity statin  Continue statin at  discharge  Other Stroke Risk Factors  Advanced age  Former cigarette smoker - quit > 40 years ago  Hx stroke/TIA -lateral cerebellum old infarcts by imaging  Coronary artery disease status post CABG  Diastolic congestive Heart Failure   AS status post AVR  Other Active Problems   Code status - DNR  Right CRAO  Mild hyponatremia - Na 132->134->131  History of autoimmune related anemia - current hemoglobin stable, no sign of bleeding   Aortic Atherosclerosis (ICD10-I70.0)  Hospital day # 2  Neurology will sign off. Please call with questions. Pt will follow up with Dr. Jannifer Franklin at [facility] in about 4 weeks. Thanks for the consult.  Rosalin Hawking, MD PhD Stroke Neurology 01/29/2021 4:19 PM   To contact Stroke Continuity provider, please refer to [email or web address] After hours, contact General Neurology

## 2021-01-29 NOTE — NC FL2 (Signed)
[place] LEVEL OF CARE SCREENING TOOL     IDENTIFICATION  Patient Name: Carol [name] Birthdate: 06-11-1928 Sex: female Admission Date (Current Location): 01/27/2021  [facility] and [place] Number:  Herbalist and Address:  The [place], [place] [address], [place], [place] [address]      Provider Number: [id number]  Attending Physician Name and Address:  Florencia Reasons, MD  Relative Name and Phone Number:  Allena Napoleon [phone]    Current Level of Care: Hospital Recommended Level of Care: [place] Prior Approval Number:    Date Approved/Denied:   PASRR Number:    Discharge Plan: SNF    Current Diagnoses: Patient Active Problem List   Diagnosis Date Noted  . Acute CVA (cerebrovascular accident) ([place]) 01/27/2021  . Right leg weakness 01/27/2021  . Allergic rhinitis due to pollen 01/18/2021  . Anxiety disorder 01/18/2021  . Complication of surgical procedure 01/18/2021  . Constipation 01/18/2021  . Hypercoagulable state ([place]) 01/18/2021  . Memory problem 01/18/2021  . Overactive bladder 01/18/2021  . Polyp of colon 01/18/2021  . Sciatica 01/18/2021  . Senile purpura ([place]) 01/18/2021  . Atherosclerotic heart disease of native coronary artery without angina pectoris 01/18/2021  . Mixed hyperlipidemia 01/18/2021  . Essential hypertension 01/18/2021  . AV heart block 11/23/2020  . Coronary artery disease   . Dyspnea   . Hypertension   . Lumbar disc disease   . Lung nodules   . Obesity   . Spinal stenosis   . Paroxysmal atrial fibrillation ([place]) 01/30/2019  . Sepsis ([place]) 08/17/2018  . Autoimmune hemolytic anemia (HCC)   . Acute blood loss anemia 05/14/2018  . Elevated bilirubin 05/14/2018  . Obstructive jaundice 05/14/2018  . 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 Long term (current) use of anticoagulants 07/14/2013  . Chronic diastolic heart failure ([place]) 01/17/2012  .  Hyponatremia 01/17/2012  . Atrial fibrillation ([place])   . 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 CAD (coronary artery disease) 12/26/2011  . History of Villous adenoma of rectum   . Hypertensive heart disease without CHF   . Hyperlipidemia   . Hypothyroidism   . Lumbar degenerative disc disease   . Aortic stenosis 11/08/2011  . Personal history of solitary pulmonary nodule 11/07/2011    Orientation RESPIRATION BLADDER Height & Weight     Self  Normal Incontinent,External catheter Weight: 141 lb 12.1 oz (64.3 kg) Height:  5\' 2"  (157.5 cm)  BEHAVIORAL SYMPTOMS/MOOD NEUROLOGICAL BOWEL NUTRITION STATUS      Incontinent Diet (See DC summary)  AMBULATORY STATUS COMMUNICATION OF NEEDS Skin   Extensive Assist Verbally Skin abrasions (Ecchymosis bilateral arm, leg and buttock. Skin tear bilateral, pretibial, anterior, with foam dressing.)                       Personal Care Assistance Level of Assistance  Bathing,Feeding,Dressing Bathing Assistance: Maximum assistance Feeding assistance: Limited assistance Dressing Assistance: Maximum assistance     Functional Limitations Info  Sight,Hearing,Speech Sight Info: Adequate Hearing Info: Impaired Speech Info: Adequate    SPECIAL CARE FACTORS FREQUENCY  PT (By licensed PT),OT (By licensed OT)     PT Frequency: 5x week OT Frequency: 5x week            Contractures Contractures Info: Not present    Additional Factors Info  Code Status,Allergies,Psychotropic Code Status Info: DNR Allergies Info: Ibuprofen, Tapentadol, Ace Inhibitors, Angiotensin Receptor Blockers, Dicloxacillin,  Doxycycline, Hydrocodone, Nsaids, Adhesive tape, Latex Psychotropic Info: Aprozolam (Xanax)         Current Medications (01/29/2021):  This is the current hospital active medication list Current Facility-Administered Medications  Medication Dose Route Frequency Provider Last Rate Last Admin  . 0.9 %  sodium chloride infusion   Intravenous Continuous Florencia Reasons, MD 75 mL/hr at 01/29/21 1624 New Bag at 01/29/21 1624  . acetaminophen (TYLENOL) tablet 650 mg  650 mg Oral Q4H PRN Para Skeans, MD   650 mg at 01/28/21 2035   Or  . acetaminophen (TYLENOL) 160 MG/5ML solution 650 mg  650 mg Per Tube Q4H PRN Para Skeans, MD       Or  . acetaminophen (TYLENOL) suppository 650 mg  650 mg Rectal Q4H PRN Para Skeans, MD      . ALPRAZolam Duanne Moron) tablet 0.25 mg  0.25 mg Oral QHS Florina Ou V, MD   0.25 mg at 01/28/21 2035  . apixaban (ELIQUIS) tablet 5 mg  5 mg Oral BID Joselyn Glassman A, RPH   5 mg at 01/29/21 [id number]  . atorvastatin (LIPITOR) tablet 20 mg  20 mg Oral QHS Para Skeans, MD   20 mg at 01/28/21 2037  . docusate sodium (COLACE) capsule 100 mg  100 mg Oral Daily Vickie Epley, MD   100 mg at 01/29/21 1229  . gabapentin (NEURONTIN) capsule 100 mg  100 mg Oral TID Para Skeans, MD   100 mg at 01/29/21 1622  . levothyroxine (SYNTHROID) tablet 88 mcg  88 mcg Oral QAC breakfast Para Skeans, MD   88 mcg at 01/29/21 [phone]  . loratadine (CLARITIN) tablet 10 mg  10 mg Oral Daily Para Skeans, MD   10 mg at 01/29/21 0962  . metoprolol tartrate (LOPRESSOR) injection 2.5 mg  2.5 mg Intravenous Q4H PRN Para Skeans, MD   2.5 mg at 01/27/21 1722  . metoprolol tartrate (LOPRESSOR) tablet 25 mg  25 mg Oral Daily Bernerd Pho M, PA-C   25 mg at 01/29/21 0809  . nystatin (MYCOSTATIN/NYSTOP) topical powder   Topical BID Florencia Reasons, MD   Given at 01/29/21 1622  . oxybutynin (DITROPAN) tablet 5 mg  5 mg Oral Baron Sane, MD   5 mg at 01/28/21 [id number]  . polyethylene glycol (MIRALAX / GLYCOLAX) packet 17 g  17 g Oral Daily PRN Vickie Epley, MD   17 g at 01/29/21 1230  . predniSONE (DELTASONE) tablet 5 mg  5 mg Oral Q breakfast Para Skeans, MD   5 mg at 01/29/21 0809  . senna (SENOKOT) tablet 17.2 mg  2 tablet Oral Daily Vickie Epley, MD   17.2 mg at 01/29/21 1229     Discharge Medications: Please see discharge summary for a list of  discharge medications.  Relevant Imaging Results:  Relevant Lab Results:   Additional Information SS# [id number], Covid vaccinated  Coralee Pesa, [place]

## 2021-01-29 NOTE — Progress Notes (Signed)
*  PRELIMINARY RESULTS* Echocardiogram 2D Echocardiogram has been performed.  Leavy Cella 01/29/2021, 11:09 AM

## 2021-01-29 NOTE — Progress Notes (Addendum)
PROGRESS NOTE    Carol [name]  LEX:[id number] DOB: November 23, 1928 DOA: 01/27/2021 PCP: Lujean Amel, MD    Chief Complaint  Patient presents with  . Right Side Weakness/Slurred Speech    LSN : Yesterday Afternoon     Brief Narrative:  Carol [name] is a 87 y.o. female presenting to the emergency room complaints of right leg weakness and slurred speech that started 2/3 afternoon sometime.  found to have acute cva/uti   Subjective:  She has slurred speech, right lower extremity weakness,  she is in afib/rvr, heart rate is better than yesterday She  denies chest pain,  No hypoxia, no edema, no fever C/o being constipated   Assessment & Plan:   Principal Problem:   Acute CVA (cerebrovascular accident) [facility]) Active Problems:   Hyperlipidemia   Hypothyroidism   Chronic diastolic heart failure (HCC)   Atrial fibrillation (HCC)   AV heart block   Right leg weakness  Acute CVA, likely embolic -Present with slurred speech and right leg weakness - MRI " Areas of restricted diffusion within the left cerebellar hemisphere and left caudate body extending into the lenticulocapsular region, consistent with acute infarcts. 2. Remote lacunar infarcts in the bilateral cerebellar hemispheres. 3. Moderate chronic microvascular ischemic changes." She was on eliquis 2.5mg  bid, now on 5mg  bid, continue lipitor , PT recommended SNF Neurology consulted , will follow neuro recommendation  Acute metabolic encephalopathy  Daughter reports patient as baseline is aaox3,  takes her meds reliably , patient is from independent living ([facility] ), walks with a walker/cane Acute encephalopathy likely from UTI and acute CVA ,expect improvement   Afib/RVR, recent history of bradycardia, was evaluated by EP physiology, metoprolol changed to once a day at home per daughter report -Possibly from stress and holding metoprolol for permissive hypertension -Cardiology consulted  CAD (s/p  CABG in 2013), severe AS (s/p tissue AVR in 2013),   chronic diastolic CHF: Appears euvolemic to dry with poor oral intake , orthostatic hypotension, start on gentle hydration, close monitor volume status -Cardiology on board  UTI Urine culture positive for Klebsiella pneumonia, continue Rocephin  Orthostatic hypotension Pt reports symptomatic orthostatic hypotension Continue gentle hydration   am cortisol unremarkable Repeat orthostatic   Hyponatremia, likely due to poor oral intake, dehydration Continue hydration, encourage oral intake, repeat BMP in the morning  History of hemolytic anemia, on chronic prednisone CBC stable  Hypothyroidism Continue Synthroid    DVT prophylaxis: SCD's Start: 01/27/21 1357 apixaban (ELIQUIS) tablet 5 mg   Code Status:DNR, daughter confirmed over the phone Family Communication: daughter over the phone Disposition:   Status is: Inpatient  Dispo: The patient is from: independent living               [facility]ticipated d[facility] to: [facility], SNF               Anticipated d/c date is: 71-48hrs , need cardiology and urology clearance                Consultants:   Neurology  cardiology  Procedures:   None  Antimicrobials:   Rocephin     Objective: Vitals:   01/29/21 0418 01/29/21 0833 01/29/21 1153 01/29/21 1537  BP: (!) 147/86 (!) 161/92 (!) 151/80 (!) 170/87  Pulse: (!) 107 (!) 116 (!) 104 (!) 109  Resp: 16 20 20 18   Temp: 99.3 F (37.4 C) 98.1 F (36.7 C) (!) 97.4 F (36.3 C) 97.6 F (36.4 C)  TempSrc:  Oral Oral Oral Oral  SpO2: 99% 97% 100% 96%  Weight:      Height:        Intake/Output Summary (Last 24 hours) at 01/29/2021 1604 Last data filed at 01/29/2021 0700 Gross per 24 hour  Intake 348.77 ml  Output 300 ml  Net 48.77 ml   Filed Weights   01/27/21 1810  Weight: 64.3 kg    Examination:  General exam: frail, states she is at [facility], oriented to time today, she is calm and cooperative Respiratory  system: Clear to auscultation. Respiratory effort normal. Cardiovascular system: IRRR, + precordial soft murmur, No pedal edema. Gastrointestinal system: Abdomen is nondistended, soft and nontender. Normal bowel sounds heard. Central nervous system: Slightly confused, slurred speech, right lower leg weakness Extremities: No edema Skin: chronic venous stasis changes bilateral lower extremity Psychiatry: confused but cooperative,     Data Reviewed: I have personally reviewed following labs and imaging studies  CBC: Recent Labs  Lab 01/27/21 0643 01/27/21 0647  WBC 8.5  --   NEUTROABS 5.8  --   HGB 12.4 13.3  HCT 38.6 39.0  MCV 90.2  --   PLT 204  --     Basic Metabolic Panel: Recent Labs  Lab 01/27/21 0643 01/27/21 0647 01/29/21 0202  NA 132* 134* 131*  K 4.0 4.0 3.8  CL 97* 97* 98  CO2 26  --  25  GLUCOSE 115* 106* 108*  BUN 18 21 20   CREATININE 1.04* 1.00 1.00  CALCIUM 9.3  --  8.8*  MG  --   --  2.1    GFR: Estimated Creatinine Clearance: 32.3 mL/min (by C-G formula based on SCr of 1 mg/dL).  Liver Function Tests: Recent Labs  Lab 01/27/21 0643  AST 22  ALT 17  ALKPHOS 54  BILITOT 1.0  PROT 6.5  ALBUMIN 3.8    CBG: Recent Labs  Lab 01/27/21 0647  GLUCAP 108*     Recent Results (from the past 240 hour(s))  Urine culture     Status: Abnormal   Collection Time: 01/27/21  5:32 PM   Specimen: Urine, Random  Result Value Ref Range Status   Specimen Description URINE, RANDOM  Final   Special Requests   Final    NONE Performed at [facility], [place] [address], [place], [place] [address]    Culture >=100,000 COLONIES/mL KLEBSIELLA PNEUMONIAE (A)  Final   Report Status 01/29/2021 FINAL  Final   Organism ID, Bacteria KLEBSIELLA PNEUMONIAE (A)  Final      Susceptibility   Klebsiella pneumoniae - MIC*    AMPICILLIN >=32 RESISTANT Resistant     CEFAZOLIN <=4 SENSITIVE Sensitive     CEFEPIME <=0.12 SENSITIVE Sensitive     CEFTRIAXONE <=0.25  SENSITIVE Sensitive     CIPROFLOXACIN <=0.25 SENSITIVE Sensitive     GENTAMICIN <=1 SENSITIVE Sensitive     IMIPENEM <=0.25 SENSITIVE Sensitive     NITROFURANTOIN 32 SENSITIVE Sensitive     TRIMETH/SULFA >=320 RESISTANT Resistant     AMPICILLIN/SULBACTAM >=32 RESISTANT Resistant     PIP/TAZO <=4 SENSITIVE Sensitive     * >=100,000 COLONIES/mL KLEBSIELLA PNEUMONIAE  SARS CORONAVIRUS 2 (TAT 6-24 HRS) Nasopharyngeal Nasopharyngeal Swab     Status: None   Collection Time: 01/28/21  9:58 AM   Specimen: Nasopharyngeal Swab  Result Value Ref Range Status   SARS Coronavirus 2 NEGATIVE NEGATIVE Final    Comment: (NOTE) SARS-CoV-2 target nucleic acids are NOT DETECTED.  The SARS-CoV-2 RNA  is generally detectable in upper and lower respiratory specimens during the acute phase of infection. Negative results do not preclude SARS-CoV-2 infection, do not rule out co-infections with other pathogens, and should not be used as the sole basis for treatment or other patient management decisions. Negative results must be combined with clinical observations, patient history, and epidemiological information. The expected result is Negative.  Fact Sheet for Patients: SugarRoll.be  Fact Sheet for Healthcare Providers: https://www.woods-mathews.com/  This test is not yet approved or cleared by the [place] FDA and  has been authorized for detection and/or diagnosis of SARS-CoV-2 by FDA under an Emergency Use Authorization (EUA). This EUA will remain  in effect (meaning this test can be used) for the duration of the COVID-19 declaration under Se ction 564(b)(1) of the Act, 21 U.S.C. section 360bbb-3(b)(1), unless the authorization is terminated or revoked sooner.  Performed at [facility], [place] [address], [place], [place] [address]          Radiology Studies: CT ANGIO HEAD W OR WO CONTRAST  Result Date: 01/28/2021 CLINICAL DATA:  Stroke.  TIA. Left cerebellar and basal ganglia strokes. EXAM: CT ANGIOGRAPHY HEAD AND NECK TECHNIQUE: Multidetector CT imaging of the head and neck was performed using the standard protocol during bolus administration of intravenous contrast. Multiplanar CT image reconstructions and MIPs were obtained to evaluate the vascular anatomy. Carotid stenosis measurements (when applicable) are obtained utilizing NASCET criteria, using the distal internal carotid diameter as the denominator. CONTRAST:  15mL OMNIPAQUE IOHEXOL 350 MG/ML SOLN COMPARISON:  CT and MRI studies done yesterday. FINDINGS: CT HEAD FINDINGS Brain: Focal low-density now evident in the left cerebellum. No evidence of hemorrhagic transformation. Cerebral hemispheres show a background pattern of extensive chronic small vessel disease of the white matter. The acute infarction in the left basal ganglia and radiating white matter tracts is barely discernible. No hemorrhagic transformation. No new insult. No hydrocephalus or extra-axial collection. Vascular: There is atherosclerotic calcification of the major vessels at the base of the brain. Skull: Negative Sinuses: Clear Orbits: Normal Review of the MIP images confirms the above findings CTA NECK FINDINGS Aortic arch: Aortic atherosclerotic calcification. Branching pattern is normal without flow limiting stenosis. Right carotid system: Common carotid artery shows atherosclerotic plaque but without flow limiting stenosis proximal to the bifurcation. Dense calcified plaque at the carotid bifurcation and ICA bulb. Minimal diameter in the ICA bulb is 1.5 mm. Compared to a more distal cervical ICA of 5 mm, this indicates a 70% stenosis. Left carotid system: Common carotid artery shows scattered plaque but is sufficiently patent to the bifurcation region. Dense calcified plaque at the carotid bifurcation and ICA bulb. Minimal diameter in the ICA bulb is 2.5 mm. Compared to a more distal cervical ICA diameter of 5 mm, this  indicates a 50% stenosis. Vertebral arteries: Both subclavian arteries show atherosclerotic disease but without a flow limiting stenosis. Both vertebral arteries are patent with stenosis of 50% at their origins. Beyond that, the vessels are patent through the cervical region to the foramen magnum. Skeleton: Ordinary cervical spondylosis. Chronic arthropathy at the C1-2 articulation. Other neck: No mass or lymphadenopathy in the neck. Upper chest: Mild scarring at the lung apices.  No active process. Review of the MIP images confirms the above findings CTA HEAD FINDINGS Anterior circulation: Both internal carotid arteries are patent through the skull base and siphon regions. There is calcification in the carotid siphon regions with stenosis estimated at 30% on both sides. The anterior  and middle cerebral vessels are patent without proximal stenosis, aneurysm or vascular malformation. No large or medium vessel occlusion identified. Posterior circulation: Both vertebral arteries are patent through the foramen magnum to the basilar. Focal plaque in the left vertebral V4 segment with stenosis of 30%. No basilar stenosis. Major posterior circulation branch vessels are patent. Left PCA receives its supply from the anterior circulation. Venous sinuses: Patent and normal. Anatomic variants: None Review of the MIP images confirms the above findings IMPRESSION: 1. Dense calcified plaque at both carotid bifurcations and ICA bulb regions. 70% stenosis of the ICA bulb on the right. 50% stenosis of the ICA bulb on the left. 2. 50% stenoses at both vertebral artery origins. 30% stenosis of the left vertebral V4 segment. 3. No intracranial large or medium vessel occlusion or correctable proximal stenosis. Aortic Atherosclerosis (ICD10-I70.0). Electronically Signed   By: Nelson Chimes M.D.   On: 01/28/2021 19:56   CT ANGIO NECK W OR WO CONTRAST  Result Date: 01/28/2021 CLINICAL DATA:  Stroke. TIA. Left cerebellar and basal ganglia  strokes. EXAM: CT ANGIOGRAPHY HEAD AND NECK TECHNIQUE: Multidetector CT imaging of the head and neck was performed using the standard protocol during bolus administration of intravenous contrast. Multiplanar CT image reconstructions and MIPs were obtained to evaluate the vascular anatomy. Carotid stenosis measurements (when applicable) are obtained utilizing NASCET criteria, using the distal internal carotid diameter as the denominator. CONTRAST:  60mL OMNIPAQUE IOHEXOL 350 MG/ML SOLN COMPARISON:  CT and MRI studies done yesterday. FINDINGS: CT HEAD FINDINGS Brain: Focal low-density now evident in the left cerebellum. No evidence of hemorrhagic transformation. Cerebral hemispheres show a background pattern of extensive chronic small vessel disease of the white matter. The acute infarction in the left basal ganglia and radiating white matter tracts is barely discernible. No hemorrhagic transformation. No new insult. No hydrocephalus or extra-axial collection. Vascular: There is atherosclerotic calcification of the major vessels at the base of the brain. Skull: Negative Sinuses: Clear Orbits: Normal Review of the MIP images confirms the above findings CTA NECK FINDINGS Aortic arch: Aortic atherosclerotic calcification. Branching pattern is normal without flow limiting stenosis. Right carotid system: Common carotid artery shows atherosclerotic plaque but without flow limiting stenosis proximal to the bifurcation. Dense calcified plaque at the carotid bifurcation and ICA bulb. Minimal diameter in the ICA bulb is 1.5 mm. Compared to a more distal cervical ICA of 5 mm, this indicates a 70% stenosis. Left carotid system: Common carotid artery shows scattered plaque but is sufficiently patent to the bifurcation region. Dense calcified plaque at the carotid bifurcation and ICA bulb. Minimal diameter in the ICA bulb is 2.5 mm. Compared to a more distal cervical ICA diameter of 5 mm, this indicates a 50% stenosis. Vertebral  arteries: Both subclavian arteries show atherosclerotic disease but without a flow limiting stenosis. Both vertebral arteries are patent with stenosis of 50% at their origins. Beyond that, the vessels are patent through the cervical region to the foramen magnum. Skeleton: Ordinary cervical spondylosis. Chronic arthropathy at the C1-2 articulation. Other neck: No mass or lymphadenopathy in the neck. Upper chest: Mild scarring at the lung apices.  No active process. Review of the MIP images confirms the above findings CTA HEAD FINDINGS Anterior circulation: Both internal carotid arteries are patent through the skull base and siphon regions. There is calcification in the carotid siphon regions with stenosis estimated at 30% on both sides. The anterior and middle cerebral vessels are patent without proximal stenosis, aneurysm or vascular malformation. No  large or medium vessel occlusion identified. Posterior circulation: Both vertebral arteries are patent through the foramen magnum to the basilar. Focal plaque in the left vertebral V4 segment with stenosis of 30%. No basilar stenosis. Major posterior circulation branch vessels are patent. Left PCA receives its supply from the anterior circulation. Venous sinuses: Patent and normal. Anatomic variants: None Review of the MIP images confirms the above findings IMPRESSION: 1. Dense calcified plaque at both carotid bifurcations and ICA bulb regions. 70% stenosis of the ICA bulb on the right. 50% stenosis of the ICA bulb on the left. 2. 50% stenoses at both vertebral artery origins. 30% stenosis of the left vertebral V4 segment. 3. No intracranial large or medium vessel occlusion or correctable proximal stenosis. Aortic Atherosclerosis (ICD10-I70.0). Electronically Signed   By: Nelson Chimes M.D.   On: 01/28/2021 19:56   ECHOCARDIOGRAM COMPLETE  Result Date: 01/29/2021    ECHOCARDIOGRAM REPORT   Patient Name:   CHEY RACHELS Date of Exam: 01/29/2021 Medical Rec #:   [id number]           Height:       62.0 in Accession #:    [id number]          Weight:       141.8 lb Date of Birth:  Jul 28, 1928            BSA:          1.652 m Patient Age:    64 years            BP:           161/92 mmHg Patient Gender: F                   HR:           116 bpm. Exam Location:  Inpatient Procedure: 2D Echo Indications:    Stroke 434.91 / I163.9  History:        Patient has prior history of Echocardiogram examinations, most                 recent 01/17/2012. CAD, Prior CABG, Stroke, Arrythmias:Atrial                 Fibrillation; Risk Factors:Dyslipidemia, Former Smoker and                 Hypertension. Sepsis, 19 mm Magna Ease pericardial tissue valve                 12/28/2011.                 Aortic Valve: 19 mm Edwards MagnaEase valve is present in the                 aortic position.  Sonographer:    Leavy Cella Referring Phys: [place] Comments: Restricted Mobility. IMPRESSIONS  1. Left ventricular ejection fraction, by estimation, is 60 to 65%. The left ventricle has normal function. The left ventricle has no regional wall motion abnormalities. Left ventricular diastolic function could not be evaluated.  2. Right ventricular systolic function is normal. The right ventricular size is normal. There is normal pulmonary artery systolic pressure.  3. The mitral valve is abnormal. Trivial mitral valve regurgitation. Moderate mitral annular calcification.  4. The aortic valve has been repaired/replaced. Aortic valve regurgitation is trivial. There is a 19 mm Edwards MagnaEase valve present in the aortic position. Echo findings are consistent with normal structure and  function of the aortic valve prosthesis. Aortic valve mean gradient measures 14.3 mmHg. Aortic valve Vmax measures 2.44 m/s.  5. The inferior vena cava is normal in size with greater than 50% respiratory variability, suggesting right atrial pressure of 3 mmHg. Comparison(s): Prior images unable to be directly  viewed, comparison made by report only. Changes from prior study are noted. 01/17/12: LVEF 55-60%, aortic bioprosthesis -mean gradient 22 mmHg. FINDINGS  Left Ventricle: Left ventricular ejection fraction, by estimation, is 60 to 65%. The left ventricle has normal function. The left ventricle has no regional wall motion abnormalities. The left ventricular internal cavity size was normal in size. There is  no left ventricular hypertrophy. Left ventricular diastolic function could not be evaluated due to atrial fibrillation. Left ventricular diastolic function could not be evaluated. Right Ventricle: The right ventricular size is normal. No increase in right ventricular wall thickness. Right ventricular systolic function is normal. There is normal pulmonary artery systolic pressure. The tricuspid regurgitant velocity is 2.43 m/s, and  with an assumed right atrial pressure of 3 mmHg, the estimated right ventricular systolic pressure is 61.4 mmHg. Left Atrium: Left atrial size was normal in size. Right Atrium: Right atrial size was normal in size. Pericardium: There is no evidence of pericardial effusion. Mitral Valve: The mitral valve is abnormal. There is mild thickening of the mitral valve leaflet(s). Moderate mitral annular calcification. Trivial mitral valve regurgitation. Tricuspid Valve: The tricuspid valve is grossly normal. Tricuspid valve regurgitation is trivial. Aortic Valve: The aortic valve has been repaired/replaced. Aortic valve regurgitation is trivial. Aortic valve mean gradient measures 14.3 mmHg. Aortic valve peak gradient measures 23.9 mmHg. Aortic valve area, by VTI measures 0.53 cm. There is a 19 mm Edwards MagnaEase valve present in the aortic position. Echo findings are consistent with normal structure and function of the aortic valve prosthesis. Pulmonic Valve: The pulmonic valve was normal in structure. Pulmonic valve regurgitation is trivial. Aorta: The aortic root and ascending aorta are  structurally normal, with no evidence of dilitation. Venous: The inferior vena cava is normal in size with greater than 50% respiratory variability, suggesting right atrial pressure of 3 mmHg. IAS/Shunts: No atrial level shunt detected by color flow Doppler.  LEFT VENTRICLE PLAX 2D LVOT diam:     1.90 cm  Diastology LV SV:         23       LV e' medial:   3.70 cm/s LV SV Index:   14       LV E/e' medial: 34.3 LVOT Area:     2.84 cm  RIGHT VENTRICLE RV S prime:     14.20 cm/s TAPSE (M-mode): 1.5 cm LEFT ATRIUM             Index       RIGHT ATRIUM           Index LA diam:        3.60 cm 2.18 cm/m  RA Area:     16.20 cm LA Vol (A2C):   49.1 ml 29.73 ml/m RA Volume:   46.60 ml  28.22 ml/m LA Vol (A4C):   47.9 ml 29.00 ml/m LA Biplane Vol: 53.2 ml 32.21 ml/m  AORTIC VALVE AV Area (Vmax):    0.59 cm AV Area (Vmean):   0.57 cm AV Area (VTI):     0.53 cm AV Vmax:           244.33 cm/s AV Vmean:  174.000 cm/s AV VTI:            0.432 m AV Peak Grad:      23.9 mmHg AV Mean Grad:      14.3 mmHg LVOT Vmax:         51.20 cm/s LVOT Vmean:        35.267 cm/s LVOT VTI:          0.080 m LVOT/AV VTI ratio: 0.19  AORTA Ao Root diam: 2.40 cm MITRAL VALVE                TRICUSPID VALVE MV Area (PHT): 6.71 cm     TR Peak grad:   23.6 mmHg MV Decel Time: 113 msec     TR Vmax:        243.00 cm/s MV E velocity: 127.00 cm/s MV A velocity: 58.10 cm/s   SHUNTS MV E/A ratio:  2.19         Systemic VTI:  0.08 m                             Systemic Diam: 1.90 cm Lyman Bishop MD Electronically signed by Lyman Bishop MD Signature Date/Time: 01/29/2021/12:51:58 PM    Final         Scheduled Meds: . ALPRAZolam  0.25 mg Oral QHS  . apixaban  5 mg Oral BID  . atorvastatin  20 mg Oral QHS  . docusate sodium  100 mg Oral Daily  . gabapentin  100 mg Oral TID  . levothyroxine  88 mcg Oral QAC breakfast  . loratadine  10 mg Oral Daily  . metoprolol tartrate  25 mg Oral Daily  . nystatin   Topical BID  . oxybutynin  5 mg  Oral QODAY  . predniSONE  5 mg Oral Q breakfast  . senna  2 tablet Oral Daily   Continuous Infusions: . sodium chloride       LOS: 2 days   Time spent: 36mins Greater than 50% of this time was spent in counseling, explanation of diagnosis, planning of further management, and coordination of care.  I have personally reviewed and interpreted on  01/29/2021 daily labs, tele strips, imagings as discussed above under date review session and assessment and plans.  I reviewed all nursing notes, pharmacy notes, consultant notes,  vitals, pertinent old records  I have discussed plan of care as described above with RN , patient and family on 01/29/2021  Voice Recognition /Dragon dictation system was used to create this note, attempts have been made to correct errors. Please contact the author with questions and/or clarifications.   Florencia Reasons, MD PhD FACP [facility]  Available via Epic secure chat 7am-7pm for nonurgent issues Please page for urgent issues To page the attending provider between 7A-7P or the covering provider during after hours 7P-7A, please log into the web site [email or web address] and access using universal [place] password for that web site. If you do not have the password, please call the hospital operator.    01/29/2021, 4:04 PM

## 2021-01-29 NOTE — Social Work (Signed)
To Whom It May Concern:  Please be advised that the above-named patient will require a short-term nursing home stay - anticipated 30 days or less for rehabilitation and strengthening.  The plan is for return home.  

## 2021-01-29 NOTE — TOC Initial Note (Signed)
Transition of Care [facility]) - Initial/Assessment Note    Patient Details  Name: Carol [name] MRN: [id number] Date of Birth: March 06, 1928  Transition of Care [facility]) CM/SW Contact:    Coralee Pesa, [place] Phone Number: 01/29/2021, 5:00 PM  Clinical Narrative:                 CSW went to speak with pt and dtr at bedside. CSW explained SNF recommendation and they are agreeable. Pt's dtr noted that pt is from [facility] and they would like for her to go there for SNF. If for some reason unable to go there, the other friends homes would also be an option. Pt stated that she has had both Moderna shots and the booster. Pt's dtr advised that pt also has another long term care insurance that can be used if needed, the brother has this information. Pt's other dtr is requesting FMLA paperwork to come care for her mother, her email is [email or web address]. CSW will do workup and faxout, and request covid test. SW will follow for DC planning.  Expected Discharge Plan: [place] Barriers to Discharge: Continued Medical Work up,Insurance Authorization,SNF Pending bed offer   Patient Goals and CMS Choice Patient states their goals for this hospitalization and ongoing recovery are:: Pt and dtr agreeable to SNF at [facility]. CMS Medicare.gov Compare Post Acute Care list provided to:: Patient Choice offered to / list presented to : [place]  Expected Discharge Plan and Services Expected Discharge Plan: [place] Acute Care Choice: [place] Living arrangements for the past 2 months: [place]                                      Prior Living Arrangements/Services Living arrangements for the past 2 months: [place] Lives with:: Facility Resident Patient language and need for interpreter reviewed:: Yes Do you feel safe going back to the place where you live?: Yes      Need for  Family Participation in Patient Care: Yes (Comment) Care giver support system in place?: Yes (comment)   Criminal Activity/Legal Involvement Pertinent to Current Situation/Hospitalization: No - Comment as needed  Activities of Daily Living Home Assistive Devices/Equipment: Gilford Rile (specify type) ADL Screening (condition at time of admission) Patient's cognitive ability adequate to safely complete daily activities?: Yes Is the patient deaf or have difficulty hearing?: Yes Does the patient have difficulty seeing, even when wearing glasses/contacts?: Yes Does the patient have difficulty concentrating, remembering, or making decisions?: No Patient able to express need for assistance with ADLs?: Yes Does the patient have difficulty dressing or bathing?: Yes Independently performs ADLs?: No Communication: Independent Is this a change from baseline?: Pre-admission baseline Dressing (OT): Needs assistance Is this a change from baseline?: Pre-admission baseline Grooming: Needs assistance Is this a change from baseline?: Pre-admission baseline Feeding: Independent Bathing: Needs assistance Is this a change from baseline?: Pre-admission baseline Toileting: Independent In/Out Bed: Independent Walks in Home: Independent Does the patient have difficulty walking or climbing stairs?: Yes Weakness of Legs: Right Weakness of Arms/Hands: Both  Permission Sought/Granted Permission sought to share information with : Family Supports Permission granted to share information with : Yes, Verbal Permission Granted  Share Information with NAME: Allena Napoleon     Permission granted to share info w Relationship: Daughter  Permission granted to share info w  Contact Information: [phone]  Emotional Assessment Appearance:: Appears stated age Attitude/Demeanor/Rapport: Gracious Affect (typically observed): Pleasant Orientation: : Oriented to Self Alcohol / Substance Use: Not Applicable Psych  Involvement: No (comment)  Admission diagnosis:  Rash and nonspecific skin eruption [R21] Right leg weakness [R29.898] Urinary tract infection without hematuria, site unspecified [N39.0] Acute stroke due to ischemia [facility]) [I63.9] Patient Active Problem List   Diagnosis Date Noted  . Acute CVA (cerebrovascular accident) ([place]) 01/27/2021  . Right leg weakness 01/27/2021  . Allergic rhinitis due to pollen 01/18/2021  . Anxiety disorder 01/18/2021  . Complication of surgical procedure 01/18/2021  . Constipation 01/18/2021  . Hypercoagulable state ([place]) 01/18/2021  . Memory problem 01/18/2021  . Overactive bladder 01/18/2021  . Polyp of colon 01/18/2021  . Sciatica 01/18/2021  . Senile purpura ([place]) 01/18/2021  . Atherosclerotic heart disease of native coronary artery without angina pectoris 01/18/2021  . Mixed hyperlipidemia 01/18/2021  . Essential hypertension 01/18/2021  . AV heart block 11/23/2020  . Coronary artery disease   . Dyspnea   . Hypertension   . Lumbar disc disease   . Lung nodules   . Obesity   . Spinal stenosis   . Paroxysmal atrial fibrillation ([place]) 01/30/2019  . Sepsis ([place]) 08/17/2018  . Autoimmune hemolytic anemia (HCC)   . Acute blood loss anemia 05/14/2018  . Elevated bilirubin 05/14/2018  . Obstructive jaundice 05/14/2018  . 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 Long term (current) use of anticoagulants 07/14/2013  . Chronic diastolic heart failure ([place]) 01/17/2012  . Hyponatremia 01/17/2012  . Atrial fibrillation ([place])   . 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 CAD (coronary artery disease) 12/26/2011  . History of Villous adenoma of rectum   . Hypertensive heart disease without CHF   . Hyperlipidemia   . Hypothyroidism   . Lumbar degenerative disc disease   . Aortic stenosis 11/08/2011  . Personal history of solitary pulmonary nodule 11/07/2011   PCP:  Lujean Amel, MD Pharmacy:   [id number] - [place], [place] [address] Phone: [phone] Fax: [phone]     Social Determinants of Health (SDOH) Interventions    Readmission Risk Interventions No flowsheet data found.

## 2021-01-30 DIAGNOSIS — I4891 Unspecified atrial fibrillation: Secondary | ICD-10-CM | POA: Diagnosis not present

## 2021-01-30 DIAGNOSIS — I639 Cerebral infarction, unspecified: Secondary | ICD-10-CM | POA: Diagnosis not present

## 2021-01-30 LAB — BASIC METABOLIC PANEL
Anion gap: 9 (ref 5–15)
BUN: 23 mg/dL (ref 8–23)
CO2: 23 mmol/L (ref 22–32)
Calcium: 8 mg/dL — ABNORMAL LOW (ref 8.9–10.3)
Chloride: 98 mmol/L (ref 98–111)
Creatinine, Ser: 1.02 mg/dL — ABNORMAL HIGH (ref 0.44–1.00)
GFR, Estimated: 52 mL/min — ABNORMAL LOW (ref >60)
Glucose, Bld: 103 mg/dL — ABNORMAL HIGH (ref 70–99)
Potassium: 3.7 mmol/L (ref 3.5–5.1)
Sodium: 130 mmol/L — ABNORMAL LOW (ref 135–145)

## 2021-01-30 LAB — SARS CORONAVIRUS 2 BY RT PCR (HOSPITAL ORDER, PERFORMED IN ~~LOC~~ HOSPITAL LAB): SARS Coronavirus 2: NEGATIVE

## 2021-01-30 MED ORDER — CEPHALEXIN 500 MG PO CAPS
500.0000 mg | ORAL_CAPSULE | Freq: Two times a day (BID) | ORAL | Status: AC
  Administered 2021-01-30 – 2021-02-03 (×10): 500 mg via ORAL
  Filled 2021-01-30 (×10): qty 1

## 2021-01-30 NOTE — Progress Notes (Signed)
Physical Therapy Treatment Patient Details Name: Carol [name] MRN: [id number] DOB: [birth year] Today's Date: 01/30/2021    History of Present Illness Pt is a 87 y.o. female who presents with R leg weakness and slurred speech. 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 PMH: acute blood loss anemia, autoimmune hemoyltic anemia, a-fib on anticoagulation, heart disease, chronic diastolic heart failure, spinal stenosis, sciatica, s/p AVR and CABG, dyspnea, CAD, aortic stenosis, HTN, and hypothyroidism.    PT Comments    Patient progressing slowly towards PT goals. Continues to have right inattention, Right sided weakness, impaired balance and impaired cognition. Pt requires Mod A for bed mobility, standing and SPT to chair with step by step cues for sequencing. Difficulty advancing RLE during transfer. Pt reports "they are ignoring me here," however pt did not push the call bell. Instructed pt on where/how to push the call bell- placed cardiac lead on call bell button to help with tactile sensation as pt with poor vision. Pt incontinent upon standing so helped with clean up. Continue to recommend SNF. Will follow.   Follow Up Recommendations  SNF;Supervision/Assistance - 24 hour     Equipment Recommendations  Rolling walker with 5" wheels;3in1 (PT);Wheelchair (measurements PT);Wheelchair cushion (measurements PT);Hospital bed    Recommendations for Other Services       Precautions / Restrictions Precautions Precautions: Fall;Other (comment) Precaution Comments: Rt inattention; monitor HR; orthostatic hypotension; HOH; impaired vision Restrictions Weight Bearing Restrictions: No    Mobility  Bed Mobility Overal bed mobility: Needs Assistance Bed Mobility: Supine to Sit     Supine to sit: Mod assist;HOB elevated     General bed mobility comments: Extra time and cues to initiate movement of legs off EOB.  Cues to use RUE during transition.  Transfers Overall transfer level: Needs assistance Equipment used: Rolling walker (2 wheeled) Transfers: Sit to/from Stand Sit to Stand: Mod assist Stand pivot transfers: Mod assist       General transfer comment: assist to power to standing with cues for hand placement/technique, posterior lean through hips. Able to take a few steps to get to chair towards left side with difficulty advancing RLE, assist with RW management, weight shifting, and movement of RLE. No knee buckling. Trying to sit prematurely.  Ambulation/Gait                 Stairs             Wheelchair Mobility    Modified Rankin (Stroke Patients Only) Modified Rankin (Stroke Patients Only) Pre-Morbid Rankin Score: Slight disability Modified Rankin: Moderately severe disability     Balance Overall balance assessment: Needs assistance Sitting-balance support: Feet supported;Single extremity supported Sitting balance-Leahy Scale: Poor Sitting balance - Comments: Requires UE support sitting EOB, right lateral lean. Postural control: Right lateral lean Standing balance support: During functional activity Standing balance-Leahy Scale: Poor Standing balance comment: dependent on therapist for support and UE support.                            Cognition Arousal/Alertness: Awake/alert Behavior During Therapy: Anxious Overall Cognitive Status: Impaired/Different from baseline Area of Impairment: Attention;Memory;Following commands;Safety/judgement                   Current Attention Level: Sustained Memory: Decreased recall of precautions;Decreased short-term memory Following Commands: Follows one step commands with increased time (and repetition) Safety/Judgement: Decreased awareness of safety;Decreased awareness  of deficits Awareness: Intellectual Problem Solving: Slow processing;Decreased initiation;Difficulty sequencing;Requires verbal  cues;Requires tactile cues General Comments: Requires repetition to follow commands; HOH. Right inattention. "they are ignoring me in here," even though pt had not pushed the call bell. Having difficulty seeing call bell so placed lead sticker on there for tactile cues. Requires multimodal cues to initiate and sequence tasks.      Exercises      General Comments General comments (skin integrity, edema, etc.): HR in 130s bpm with activity. 2/4 DOE. Pt incontinent of urine upon standing, Got pt cleaned up in chair and asked tech for pure wick.      Pertinent Vitals/Pain Pain Assessment: Faces Faces Pain Scale: Hurts little more Pain Location: BLEs Pain Descriptors / Indicators: Sore;Tender Pain Intervention(s): Repositioned;Monitored during session    Home Living                      Prior Function            PT Goals (current goals can now be found in the care plan section) Progress towards PT goals: Progressing toward goals (slowly)    Frequency    Min 3X/week      PT Plan Current plan remains appropriate    Co-evaluation              AM-PAC PT "6 Clicks" Mobility   Outcome Measure  Help needed turning from your back to your side while in a flat bed without using bedrails?: A Lot Help needed moving from lying on your back to sitting on the side of a flat bed without using bedrails?: A Lot Help needed moving to and from a bed to a chair (including a wheelchair)?: A Lot Help needed standing up from a chair using your arms (e.g., wheelchair or bedside chair)?: A Lot Help needed to walk in hospital room?: A Lot Help needed climbing 3-5 steps with a railing? : Total 6 Click Score: 11    End of Session Equipment Utilized During Treatment: Gait belt Activity Tolerance: Patient tolerated treatment well Patient left: in chair;with call bell/phone within reach;with chair alarm set Nurse Communication: Mobility status;Other (comment) (need for purewick) PT  Visit Diagnosis: Unsteadiness on feet (R26.81);Other abnormalities of gait and mobility (R26.89);Muscle weakness (generalized) (M62.81);Ataxic gait (R26.0);Difficulty in walking, not elsewhere classified (R26.2);Other symptoms and signs involving the nervous system (R29.898);Dizziness and giddiness (R42);Hemiplegia and hemiparesis Hemiplegia - Right/Left: Right Hemiplegia - dominant/non-dominant: Dominant Hemiplegia - caused by: Cerebral infarction     Time: 5597-4163 PT Time Calculation (min) (ACUTE ONLY): 29 min  Charges:  $Therapeutic Activity: 23-37 mins                     Marisa Severin, PT, DPT Acute Rehabilitation Services Pager [phone] Office [place] 01/30/2021, 3:42 PM

## 2021-01-30 NOTE — Progress Notes (Signed)
  Speech Language Pathology Treatment: Cognitive-Linquistic  Patient Details Name: Carol [name] MRN: [id number] DOB: June 21, 1928 Today's Date: 01/30/2021 Time: 3474-2595 SLP Time Calculation (min) (ACUTE ONLY): 12 min  Assessment / Plan / Recommendation Clinical Impression  Pt sitting in recliner, requesting help to clean dentures. She demonstrated inattention and deficits in short-term recall, unable to identify that lower dentures were already in place and requesting repeated information after 1-2 minute delays. She was oriented to person and 2/3 elements of time independently, required multiple choice to identify [facility] as location.  She demonstrated improved clarity of speech today, with mild deficits impacting consonant specificity but not intelligibility.  She demonstrated visual recall of presented items after 3 and 5 minute delay with 50 then 80% accuracy.  Pt will benefit from ongoing SLP f/u here and in next level of care.   HPI HPI: 87 y.o. female presenting to the emergency room complaints of right leg weakness and slurred speech that started yesterday afternoon sometime      SLP Plan  Continue with current plan of care       Recommendations   SLP f/u for speech and cognition in next level of care                Oral Care Recommendations: Oral care BID SLP Visit Diagnosis: Dysarthria and anarthria (R47.1);Attention and concentration deficit;Cognitive communication deficit (R41.841) Attention and concentration deficit following: Cerebral infarction Plan: Continue with current plan of care       GO                Carol [name] 01/30/2021, 3:03 PM  Carol [name], [place] Office number [phone] Pager [phone]

## 2021-01-30 NOTE — TOC Progression Note (Signed)
Transition of Care [facility]) - Progression Note    Patient Details  Name: BUELAH RENNIE MRN: [id number] Date of Birth: 1928-07-19  Transition of Care [facility]) CM/SW [place], [place] Phone Number: 01/30/2021, 10:49 AM  Clinical Narrative:   CSW spoke with [place] at [facility], and they will have a SNF bed available for patient when she is ready. Per MD, not today but hopeful for tomorrow. CSW spoke with [name] to give update on discharge plan. CSW to follow.    Expected Discharge Plan: [place] Barriers to Discharge: Continued Medical Work up  Expected Discharge Plan and Services Expected Discharge Plan: [place] Choice: [place] arrangements for the past 2 months: Assisted Living Facility                                       Social Determinants of Health (SDOH) Interventions    Readmission Risk Interventions No flowsheet data found.

## 2021-01-30 NOTE — Progress Notes (Signed)
PROGRESS NOTE    Carol ARCH  [name] DOB: 1928-08-16 DOA: 01/27/2021 PCP: Lujean Amel, MD   Chief Complain: Right-sided weakness, slurred speech  Brief Narrative: Patient is a 87 year old female who presented to the emergency department with complaints of right leg weakness/slurred speech.  Work-up on presentation showed acute CVA.  Hospital course also remarkable for UTI, A. fib with RVR.  Cardiology and neurology were following.  Plan for skilled nursing facility placement when hemodynamically stable.  She was in A. fib with RVR this morning.  Assessment & Plan:   Principal Problem:   Acute CVA (cerebrovascular accident) [facility]) Active Problems:   Hyperlipidemia   Hypothyroidism   Chronic diastolic heart failure (HCC)   Atrial fibrillation (HCC)   AV heart block   Right leg weakness   Acute nonhemorrhagic CVA: Presented with slurred speech, right leg weakness.  MRI showed acute infarcts within the left cerebral hemisphere.  Stroke work-up initiated.  Neurology was following.  Stroke suspected to be embolic in etiology likely from A. fib with RVR and she was on subtherapeutic dose. Stroke work-up has been completed.  Eliquis dose has been increased to 5 mg twice daily.  Continue Lipitor.  Echo showed ejection fraction of 60-65%, no intracardiac source of emboli.  Hemoglobin A1c of 4.5.  TSH is normal.  LDL 48 PT/OT recommending skilled nursing facility on discharge. She should follow-up with [facility] neurology in 4 weeks.  Acute metabolic encephalopathy: At baseline, she is alert and oriented.  She is from independent living facility.  Walks with help of walker/cane.  Acute encephalopathy is thought to be secondary to UTI/acute CVA.  Continue monitoring mental status.  Delirium precautions She is alert and oriented today.  A. fib with RVR: Cardiology following.  Currently on metoprolol.  She was evaluated by EP sometime back.  Continue monitoring on telemetry.  She is  tachycardic this morning with heart rate in the range of 110-120.  History of coronary artery disease: Status post CABG in 2013, has history of severe aortic stenosis status post tissue AVR in 2013.  Follows with cardiology.  Chronic diastolic congestive heart failure: Currently appears euvolemic.  She was also orthostatic and was started on gentle IV fluids, which has been stopped now.  UTI: Urine culture showed Klebsiella pneumoniae, continue antibiotics for 5 days course.  Orthostatic hypotension: Started on gentle IV fluids,now stopped.  Cortisol unremarkable.  Continue to monitor  Hyponatremia: Could be from dehydration/poor oral intake and was given IV fluids.  Sodium levels stable in the range of 130.  Continue to monitor  History of hemolytic anemia:On chronic  prednisone therapy.  Hypothyroidism: Synthyroid             DVT prophylaxis:Eliquis Code Status: DNR Family Communication: None at the bedside Status is: Inpatient  Remains inpatient appropriate because:Inpatient level of care appropriate due to severity of illness   Dispo: The patient is from: Home              Anticipated d/c is to: SNF              Anticipated d/c date is: 1-2 days              Patient currently is not medically stable to d/c.   Difficult to place patient No      Consultants: Cardiology, neurology  Procedures: None  Antimicrobials:  Anti-infectives (From admission, onward)   Start     Dose/Rate Route Frequency Ordered Stop   01/27/21 1045  cephALEXin (KEFLEX) capsule 500 mg        500 mg Oral Once 01/27/21 1030 01/27/21 1214      Subjective:  Patient seen and examined at bedside this morning.  She was tachycardic this morning.  She denies any chest pain, shortness of breath or palpitations.  She denies any new complaints.  Mental status seem to have improved and she is alert and oriented.  Objective: Vitals:   01/29/21 2021 01/30/21 0034 01/30/21 0348 01/30/21 0731  BP:  (!) 167/92 (!) 163/84 (!) 129/94 (!) 154/93  Pulse: (!) 105 (!) 110 (!) 101 (!) 115  Resp: 16 18 16 20   Temp: 99.5 F (37.5 C) 98.7 F (37.1 C) 98.4 F (36.9 C) 97.7 F (36.5 C)  TempSrc: Oral Oral Oral Oral  SpO2: 98% 97% 100% 97%  Weight:      Height:        Intake/Output Summary (Last 24 hours) at 01/30/2021 1035 Last data filed at 01/30/2021 4742 Gross per 24 hour  Intake 958.84 ml  Output 200 ml  Net 758.84 ml   Filed Weights   01/27/21 1810  Weight: 64.3 kg    Examination:  General exam: Pleasant elderly female, very elderly HEENT:PERRL,Oral mucosa moist, Ear/Nose normal on gross exam Respiratory system: Bilateral diminished air sounds on the bases but no wheezes or crackles  cardiovascular system: A. fib with RVR. No JVD, murmurs, rubs, gallops or clicks. No pedal edema. Gastrointestinal system: Abdomen is nondistended, soft and nontender. No organomegaly or masses felt. Normal bowel sounds heard. Central nervous system: Alert and oriented.  Mild weakness on the right side Extremities: No edema, no clubbing ,no cyanosis Skin: No rashes, lesions or ulcers,no icterus ,no pallor    Data Reviewed: I have personally reviewed following labs and imaging studies  CBC: Recent Labs  Lab 01/27/21 0643 01/27/21 0647  WBC 8.5  --   NEUTROABS 5.8  --   HGB 12.4 13.3  HCT 38.6 39.0  MCV 90.2  --   PLT 204  --    Basic Metabolic Panel: Recent Labs  Lab 01/27/21 0643 01/27/21 0647 01/29/21 0202 01/30/21 0253  NA 132* 134* 131* 130*  K 4.0 4.0 3.8 3.7  CL 97* 97* 98 98  CO2 26  --  25 23  GLUCOSE 115* 106* 108* 103*  BUN 18 21 20 23   CREATININE 1.04* 1.00 1.00 1.02*  CALCIUM 9.3  --  8.8* 8.0*  MG  --   --  2.1  --    GFR: Estimated Creatinine Clearance: 31.6 mL/min (A) (by C-G formula based on SCr of 1.02 mg/dL (H)). Liver Function Tests: Recent Labs  Lab 01/27/21 0643  AST 22  ALT 17  ALKPHOS 54  BILITOT 1.0  PROT 6.5  ALBUMIN 3.8   No results for  input(s): LIPASE, AMYLASE in the last 168 hours. No results for input(s): AMMONIA in the last 168 hours. Coagulation Profile: Recent Labs  Lab 01/27/21 0643  INR 1.1   Cardiac Enzymes: No results for input(s): CKTOTAL, CKMB, CKMBINDEX, TROPONINI in the last 168 hours. BNP (last 3 results) No results for input(s): PROBNP in the last 8760 hours. HbA1C: Recent Labs    01/28/21 0301  HGBA1C 4.5*   CBG: Recent Labs  Lab 01/27/21 0647  GLUCAP 108*   Lipid Profile: Recent Labs    01/28/21 0301  CHOL 123  HDL 55  LDLCALC 48  TRIG 102  CHOLHDL 2.2   Thyroid Function Tests: Recent Labs  01/28/21 0301  TSH 3.494  FREET4 1.19*   Anemia Panel: No results for input(s): VITAMINB12, FOLATE, FERRITIN, TIBC, IRON, RETICCTPCT in the last 72 hours. Sepsis Labs: No results for input(s): PROCALCITON, LATICACIDVEN in the last 168 hours.  Recent Results (from the past 240 hour(s))  Urine culture     Status: Abnormal   Collection Time: 01/27/21  5:32 PM   Specimen: Urine, Random  Result Value Ref Range Status   Specimen Description URINE, RANDOM  Final   Special Requests   Final    NONE Performed at [facility], [address] [place], [place] [address]    Culture >=100,000 COLONIES/mL KLEBSIELLA PNEUMONIAE (A)  Final   Report Status 01/29/2021 FINAL  Final   Organism ID, Bacteria KLEBSIELLA PNEUMONIAE (A)  Final      Susceptibility   Klebsiella pneumoniae - MIC*    AMPICILLIN >=32 RESISTANT Resistant     CEFAZOLIN <=4 SENSITIVE Sensitive     CEFEPIME <=0.12 SENSITIVE Sensitive     CEFTRIAXONE <=0.25 SENSITIVE Sensitive     CIPROFLOXACIN <=0.25 SENSITIVE Sensitive     GENTAMICIN <=1 SENSITIVE Sensitive     IMIPENEM <=0.25 SENSITIVE Sensitive     NITROFURANTOIN 32 SENSITIVE Sensitive     TRIMETH/SULFA >=320 RESISTANT Resistant     AMPICILLIN/SULBACTAM >=32 RESISTANT Resistant     PIP/TAZO <=4 SENSITIVE Sensitive     * >=100,000 COLONIES/mL KLEBSIELLA PNEUMONIAE   SARS CORONAVIRUS 2 (TAT 6-24 HRS) Nasopharyngeal Nasopharyngeal Swab     Status: None   Collection Time: 01/28/21  9:58 AM   Specimen: Nasopharyngeal Swab  Result Value Ref Range Status   SARS Coronavirus 2 NEGATIVE NEGATIVE Final    Comment: (NOTE) SARS-CoV-2 target nucleic acids are NOT DETECTED.  The SARS-CoV-2 RNA is generally detectable in upper and lower respiratory specimens during the acute phase of infection. Negative results do not preclude SARS-CoV-2 infection, do not rule out co-infections with other pathogens, and should not be used as the sole basis for treatment or other patient management decisions. Negative results must be combined with clinical observations, patient history, and epidemiological information. The expected result is Negative.  Fact Sheet for Patients: SugarRoll.be  Fact Sheet for Healthcare Providers: https://www.woods-mathews.com/  This test is not yet approved or cleared by the [place] FDA and  has been authorized for detection and/or diagnosis of SARS-CoV-2 by FDA under an Emergency Use Authorization (EUA). This EUA will remain  in effect (meaning this test can be used) for the duration of the COVID-19 declaration under Se ction 564(b)(1) of the Act, 21 U.S.C. section 360bbb-3(b)(1), unless the authorization is terminated or revoked sooner.  Performed at [facility], [place] [address] [place], [place] [address]          Radiology Studies: CT ANGIO HEAD W OR WO CONTRAST  Result Date: 01/28/2021 CLINICAL DATA:  Stroke. TIA. Left cerebellar and basal ganglia strokes. EXAM: CT ANGIOGRAPHY HEAD AND NECK TECHNIQUE: Multidetector CT imaging of the head and neck was performed using the standard protocol during bolus administration of intravenous contrast. Multiplanar CT image reconstructions and MIPs were obtained to evaluate the vascular anatomy. Carotid stenosis measurements (when applicable)  are obtained utilizing NASCET criteria, using the distal internal carotid diameter as the denominator. CONTRAST:  98mL OMNIPAQUE IOHEXOL 350 MG/ML SOLN COMPARISON:  CT and MRI studies done yesterday. FINDINGS: CT HEAD FINDINGS Brain: Focal low-density now evident in the left cerebellum. No evidence of hemorrhagic transformation. Cerebral hemispheres show a background  pattern of extensive chronic small vessel disease of the white matter. The acute infarction in the left basal ganglia and radiating white matter tracts is barely discernible. No hemorrhagic transformation. No new insult. No hydrocephalus or extra-axial collection. Vascular: There is atherosclerotic calcification of the major vessels at the base of the brain. Skull: Negative Sinuses: Clear Orbits: Normal Review of the MIP images confirms the above findings CTA NECK FINDINGS Aortic arch: Aortic atherosclerotic calcification. Branching pattern is normal without flow limiting stenosis. Right carotid system: Common carotid artery shows atherosclerotic plaque but without flow limiting stenosis proximal to the bifurcation. Dense calcified plaque at the carotid bifurcation and ICA bulb. Minimal diameter in the ICA bulb is 1.5 mm. Compared to a more distal cervical ICA of 5 mm, this indicates a 70% stenosis. Left carotid system: Common carotid artery shows scattered plaque but is sufficiently patent to the bifurcation region. Dense calcified plaque at the carotid bifurcation and ICA bulb. Minimal diameter in the ICA bulb is 2.5 mm. Compared to a more distal cervical ICA diameter of 5 mm, this indicates a 50% stenosis. Vertebral arteries: Both subclavian arteries show atherosclerotic disease but without a flow limiting stenosis. Both vertebral arteries are patent with stenosis of 50% at their origins. Beyond that, the vessels are patent through the cervical region to the foramen magnum. Skeleton: Ordinary cervical spondylosis. Chronic arthropathy at the C1-2  articulation. Other neck: No mass or lymphadenopathy in the neck. Upper chest: Mild scarring at the lung apices.  No active process. Review of the MIP images confirms the above findings CTA HEAD FINDINGS Anterior circulation: Both internal carotid arteries are patent through the skull base and siphon regions. There is calcification in the carotid siphon regions with stenosis estimated at 30% on both sides. The anterior and middle cerebral vessels are patent without proximal stenosis, aneurysm or vascular malformation. No large or medium vessel occlusion identified. Posterior circulation: Both vertebral arteries are patent through the foramen magnum to the basilar. Focal plaque in the left vertebral V4 segment with stenosis of 30%. No basilar stenosis. Major posterior circulation branch vessels are patent. Left PCA receives its supply from the anterior circulation. Venous sinuses: Patent and normal. Anatomic variants: None Review of the MIP images confirms the above findings IMPRESSION: 1. Dense calcified plaque at both carotid bifurcations and ICA bulb regions. 70% stenosis of the ICA bulb on the right. 50% stenosis of the ICA bulb on the left. 2. 50% stenoses at both vertebral artery origins. 30% stenosis of the left vertebral V4 segment. 3. No intracranial large or medium vessel occlusion or correctable proximal stenosis. Aortic Atherosclerosis (ICD10-I70.0). Electronically Signed   By: Nelson Chimes M.D.   On: 01/28/2021 19:56   CT ANGIO NECK W OR WO CONTRAST  Result Date: 01/28/2021 CLINICAL DATA:  Stroke. TIA. Left cerebellar and basal ganglia strokes. EXAM: CT ANGIOGRAPHY HEAD AND NECK TECHNIQUE: Multidetector CT imaging of the head and neck was performed using the standard protocol during bolus administration of intravenous contrast. Multiplanar CT image reconstructions and MIPs were obtained to evaluate the vascular anatomy. Carotid stenosis measurements (when applicable) are obtained utilizing NASCET  criteria, using the distal internal carotid diameter as the denominator. CONTRAST:  68mL OMNIPAQUE IOHEXOL 350 MG/ML SOLN COMPARISON:  CT and MRI studies done yesterday. FINDINGS: CT HEAD FINDINGS Brain: Focal low-density now evident in the left cerebellum. No evidence of hemorrhagic transformation. Cerebral hemispheres show a background pattern of extensive chronic small vessel disease of the white matter. The acute infarction  in the left basal ganglia and radiating white matter tracts is barely discernible. No hemorrhagic transformation. No new insult. No hydrocephalus or extra-axial collection. Vascular: There is atherosclerotic calcification of the major vessels at the base of the brain. Skull: Negative Sinuses: Clear Orbits: Normal Review of the MIP images confirms the above findings CTA NECK FINDINGS Aortic arch: Aortic atherosclerotic calcification. Branching pattern is normal without flow limiting stenosis. Right carotid system: Common carotid artery shows atherosclerotic plaque but without flow limiting stenosis proximal to the bifurcation. Dense calcified plaque at the carotid bifurcation and ICA bulb. Minimal diameter in the ICA bulb is 1.5 mm. Compared to a more distal cervical ICA of 5 mm, this indicates a 70% stenosis. Left carotid system: Common carotid artery shows scattered plaque but is sufficiently patent to the bifurcation region. Dense calcified plaque at the carotid bifurcation and ICA bulb. Minimal diameter in the ICA bulb is 2.5 mm. Compared to a more distal cervical ICA diameter of 5 mm, this indicates a 50% stenosis. Vertebral arteries: Both subclavian arteries show atherosclerotic disease but without a flow limiting stenosis. Both vertebral arteries are patent with stenosis of 50% at their origins. Beyond that, the vessels are patent through the cervical region to the foramen magnum. Skeleton: Ordinary cervical spondylosis. Chronic arthropathy at the C1-2 articulation. Other neck: No mass  or lymphadenopathy in the neck. Upper chest: Mild scarring at the lung apices.  No active process. Review of the MIP images confirms the above findings CTA HEAD FINDINGS Anterior circulation: Both internal carotid arteries are patent through the skull base and siphon regions. There is calcification in the carotid siphon regions with stenosis estimated at 30% on both sides. The anterior and middle cerebral vessels are patent without proximal stenosis, aneurysm or vascular malformation. No large or medium vessel occlusion identified. Posterior circulation: Both vertebral arteries are patent through the foramen magnum to the basilar. Focal plaque in the left vertebral V4 segment with stenosis of 30%. No basilar stenosis. Major posterior circulation branch vessels are patent. Left PCA receives its supply from the anterior circulation. Venous sinuses: Patent and normal. Anatomic variants: None Review of the MIP images confirms the above findings IMPRESSION: 1. Dense calcified plaque at both carotid bifurcations and ICA bulb regions. 70% stenosis of the ICA bulb on the right. 50% stenosis of the ICA bulb on the left. 2. 50% stenoses at both vertebral artery origins. 30% stenosis of the left vertebral V4 segment. 3. No intracranial large or medium vessel occlusion or correctable proximal stenosis. Aortic Atherosclerosis (ICD10-I70.0). Electronically Signed   By: Nelson Chimes M.D.   On: 01/28/2021 19:56   ECHOCARDIOGRAM COMPLETE  Result Date: 01/29/2021    ECHOCARDIOGRAM REPORT   Patient Name:   Carol [name] Date of Exam: 01/29/2021 Medical Rec #:  [id number]           Height:       62.0 in Accession #:    [id number]          Weight:       141.8 lb Date of Birth:  18-Nov-1928            BSA:          1.652 m Patient Age:    58 years            BP:           161/92 mmHg Patient Gender: F  HR:           116 bpm. Exam Location:  Inpatient Procedure: 2D Echo Indications:    Stroke 434.91 / I163.9  History:         Patient has prior history of Echocardiogram examinations, most                 recent 01/17/2012. CAD, Prior CABG, Stroke, Arrythmias:Atrial                 Fibrillation; Risk Factors:Dyslipidemia, Former Smoker and                 Hypertension. Sepsis, 19 mm Magna Ease pericardial tissue valve                 12/28/2011.                 Aortic Valve: 19 mm Edwards MagnaEase valve is present in the                 aortic position.  Sonographer:    Leavy Cella Referring Phys: [place] Comments: Restricted Mobility. IMPRESSIONS  1. Left ventricular ejection fraction, by estimation, is 60 to 65%. The left ventricle has normal function. The left ventricle has no regional wall motion abnormalities. Left ventricular diastolic function could not be evaluated.  2. Right ventricular systolic function is normal. The right ventricular size is normal. There is normal pulmonary artery systolic pressure.  3. The mitral valve is abnormal. Trivial mitral valve regurgitation. Moderate mitral annular calcification.  4. The aortic valve has been repaired/replaced. Aortic valve regurgitation is trivial. There is a 19 mm Edwards MagnaEase valve present in the aortic position. Echo findings are consistent with normal structure and function of the aortic valve prosthesis. Aortic valve mean gradient measures 14.3 mmHg. Aortic valve Vmax measures 2.44 m/s.  5. The inferior vena cava is normal in size with greater than 50% respiratory variability, suggesting right atrial pressure of 3 mmHg. Comparison(s): Prior images unable to be directly viewed, comparison made by report only. Changes from prior study are noted. 01/17/12: LVEF 55-60%, aortic bioprosthesis -mean gradient 22 mmHg. FINDINGS  Left Ventricle: Left ventricular ejection fraction, by estimation, is 60 to 65%. The left ventricle has normal function. The left ventricle has no regional wall motion abnormalities. The left ventricular internal cavity  size was normal in size. There is  no left ventricular hypertrophy. Left ventricular diastolic function could not be evaluated due to atrial fibrillation. Left ventricular diastolic function could not be evaluated. Right Ventricle: The right ventricular size is normal. No increase in right ventricular wall thickness. Right ventricular systolic function is normal. There is normal pulmonary artery systolic pressure. The tricuspid regurgitant velocity is 2.43 m/s, and  with an assumed right atrial pressure of 3 mmHg, the estimated right ventricular systolic pressure is 29.5 mmHg. Left Atrium: Left atrial size was normal in size. Right Atrium: Right atrial size was normal in size. Pericardium: There is no evidence of pericardial effusion. Mitral Valve: The mitral valve is abnormal. There is mild thickening of the mitral valve leaflet(s). Moderate mitral annular calcification. Trivial mitral valve regurgitation. Tricuspid Valve: The tricuspid valve is grossly normal. Tricuspid valve regurgitation is trivial. Aortic Valve: The aortic valve has been repaired/replaced. Aortic valve regurgitation is trivial. Aortic valve mean gradient measures 14.3 mmHg. Aortic valve peak gradient measures 23.9 mmHg. Aortic valve area, by VTI measures 0.53 cm. There is a 19 mm Edwards MagnaEase valve  present in the aortic position. Echo findings are consistent with normal structure and function of the aortic valve prosthesis. Pulmonic Valve: The pulmonic valve was normal in structure. Pulmonic valve regurgitation is trivial. Aorta: The aortic root and ascending aorta are structurally normal, with no evidence of dilitation. Venous: The inferior vena cava is normal in size with greater than 50% respiratory variability, suggesting right atrial pressure of 3 mmHg. IAS/Shunts: No atrial level shunt detected by color flow Doppler.  LEFT VENTRICLE PLAX 2D LVOT diam:     1.90 cm  Diastology LV SV:         23       LV e' medial:   3.70 cm/s LV SV  Index:   14       LV E/e' medial: 34.3 LVOT Area:     2.84 cm  RIGHT VENTRICLE RV S prime:     14.20 cm/s TAPSE (M-mode): 1.5 cm LEFT ATRIUM             Index       RIGHT ATRIUM           Index LA diam:        3.60 cm 2.18 cm/m  RA Area:     16.20 cm LA Vol (A2C):   49.1 ml 29.73 ml/m RA Volume:   46.60 ml  28.22 ml/m LA Vol (A4C):   47.9 ml 29.00 ml/m LA Biplane Vol: 53.2 ml 32.21 ml/m  AORTIC VALVE AV Area (Vmax):    0.59 cm AV Area (Vmean):   0.57 cm AV Area (VTI):     0.53 cm AV Vmax:           244.33 cm/s AV Vmean:          174.000 cm/s AV VTI:            0.432 m AV Peak Grad:      23.9 mmHg AV Mean Grad:      14.3 mmHg LVOT Vmax:         51.20 cm/s LVOT Vmean:        35.267 cm/s LVOT VTI:          0.080 m LVOT/AV VTI ratio: 0.19  AORTA Ao Root diam: 2.40 cm MITRAL VALVE                TRICUSPID VALVE MV Area (PHT): 6.71 cm     TR Peak grad:   23.6 mmHg MV Decel Time: 113 msec     TR Vmax:        243.00 cm/s MV E velocity: 127.00 cm/s MV A velocity: 58.10 cm/s   SHUNTS MV E/A ratio:  2.19         Systemic VTI:  0.08 m                             Systemic Diam: 1.90 cm Lyman Bishop MD Electronically signed by Lyman Bishop MD Signature Date/Time: 01/29/2021/12:51:58 PM    Final         Scheduled Meds: . ALPRAZolam  0.25 mg Oral QHS  . apixaban  5 mg Oral BID  . atorvastatin  20 mg Oral QHS  . docusate sodium  100 mg Oral Daily  . gabapentin  100 mg Oral TID  . levothyroxine  88 mcg Oral QAC breakfast  . loratadine  10 mg Oral Daily  . metoprolol tartrate  25 mg Oral Daily  . nystatin  Topical BID  . oxybutynin  5 mg Oral QODAY  . predniSONE  5 mg Oral Q breakfast  . senna  2 tablet Oral Daily   Continuous Infusions: . sodium chloride 75 mL/hr at 01/30/21 0630     LOS: 3 days    Time spent: 35 mins.More than 50% of that time was spent in counseling and/or coordination of care.      Shelly Coss, MD [facility] P2/06/2021, 10:35 AM

## 2021-01-30 NOTE — Care Management Important Message (Signed)
Important Message  Patient Details  Name: Carol [name] MRN: [id number] Date of Birth: May 28, 1928   Medicare Important Message Given:  Yes     Iris Bratton 01/30/2021, 3:01 PM

## 2021-01-30 NOTE — Discharge Instructions (Signed)

## 2021-01-30 NOTE — Progress Notes (Signed)
Progress Note  Patient Name: Carol [name] Date of Encounter: 01/30/2021  Primary Cardiologist: Jenean Lindau, MD   Subjective   Denies any dyspnea, chest pain, or palpitations  Inpatient Medications    Scheduled Meds: . ALPRAZolam  0.25 mg Oral QHS  . apixaban  5 mg Oral BID  . atorvastatin  20 mg Oral QHS  . docusate sodium  100 mg Oral Daily  . gabapentin  100 mg Oral TID  . levothyroxine  88 mcg Oral QAC breakfast  . loratadine  10 mg Oral Daily  . metoprolol tartrate  25 mg Oral Daily  . nystatin   Topical BID  . oxybutynin  5 mg Oral QODAY  . predniSONE  5 mg Oral Q breakfast  . senna  2 tablet Oral Daily   Continuous Infusions: . sodium chloride 75 mL/hr at 01/30/21 0630   PRN Meds: acetaminophen **OR** acetaminophen (TYLENOL) oral liquid 160 mg/5 mL **OR** acetaminophen, metoprolol tartrate, polyethylene glycol   Vital Signs    Vitals:   01/29/21 2021 01/30/21 0034 01/30/21 0348 01/30/21 0731  BP: (!) 167/92 (!) 163/84 (!) 129/94 (!) 154/93  Pulse: (!) 105 (!) 110 (!) 101 (!) 115  Resp: 16 18 16 20   Temp: 99.5 F (37.5 C) 98.7 F (37.1 C) 98.4 F (36.9 C) 97.7 F (36.5 C)  TempSrc: Oral Oral Oral Oral  SpO2: 98% 97% 100% 97%  Weight:      Height:        Intake/Output Summary (Last 24 hours) at 01/30/2021 0957 Last data filed at 01/30/2021 6948 Gross per 24 hour  Intake 958.84 ml  Output 200 ml  Net 758.84 ml   Filed Weights   01/27/21 1810  Weight: 64.3 kg    Telemetry    Sinus tachycardia with PACs/PVCs - Personally Reviewed  ECG    No new - Personally Reviewed  Physical Exam   GEN: No acute distress.   Neck: No JVD Cardiac: tachycardic, irregular, no murmurs, rubs, or gallops.  Respiratory: Clear to auscultation bilaterally. GI: Soft, nontender, non-distended  MS: No edema; No deformity. Neuro:  slurred speech, weakness. Speech improving. Psych: Normal affect   Labs    Chemistry Recent Labs  Lab 01/27/21 0643  01/27/21 0647 01/29/21 0202 01/30/21 0253  NA 132* 134* 131* 130*  K 4.0 4.0 3.8 3.7  CL 97* 97* 98 98  CO2 26  --  25 23  GLUCOSE 115* 106* 108* 103*  BUN 18 21 20 23   CREATININE 1.04* 1.00 1.00 1.02*  CALCIUM 9.3  --  8.8* 8.0*  PROT 6.5  --   --   --   ALBUMIN 3.8  --   --   --   AST 22  --   --   --   ALT 17  --   --   --   ALKPHOS 54  --   --   --   BILITOT 1.0  --   --   --   GFRNONAA 51*  --  53* 52*  ANIONGAP 9  --  8 9     Hematology Recent Labs  Lab 01/27/21 0643 01/27/21 0647  WBC 8.5  --   RBC 4.28  --   HGB 12.4 13.3  HCT 38.6 39.0  MCV 90.2  --   MCH 29.0  --   MCHC 32.1  --   RDW 18.2*  --   PLT 204  --     Cardiac EnzymesNo  results for input(s): TROPONINI in the last 168 hours. No results for input(s): TROPIPOC in the last 168 hours.   BNPNo results for input(s): BNP, PROBNP in the last 168 hours.   DDimer No results for input(s): DDIMER in the last 168 hours.   Radiology    CT ANGIO HEAD W OR WO CONTRAST  Result Date: 01/28/2021 CLINICAL DATA:  Stroke. TIA. Left cerebellar and basal ganglia strokes. EXAM: CT ANGIOGRAPHY HEAD AND NECK TECHNIQUE: Multidetector CT imaging of the head and neck was performed using the standard protocol during bolus administration of intravenous contrast. Multiplanar CT image reconstructions and MIPs were obtained to evaluate the vascular anatomy. Carotid stenosis measurements (when applicable) are obtained utilizing NASCET criteria, using the distal internal carotid diameter as the denominator. CONTRAST:  24mL OMNIPAQUE IOHEXOL 350 MG/ML SOLN COMPARISON:  CT and MRI studies done yesterday. FINDINGS: CT HEAD FINDINGS Brain: Focal low-density now evident in the left cerebellum. No evidence of hemorrhagic transformation. Cerebral hemispheres show a background pattern of extensive chronic small vessel disease of the white matter. The acute infarction in the left basal ganglia and radiating white matter tracts is barely  discernible. No hemorrhagic transformation. No new insult. No hydrocephalus or extra-axial collection. Vascular: There is atherosclerotic calcification of the major vessels at the base of the brain. Skull: Negative Sinuses: Clear Orbits: Normal Review of the MIP images confirms the above findings CTA NECK FINDINGS Aortic arch: Aortic atherosclerotic calcification. Branching pattern is normal without flow limiting stenosis. Right carotid system: Common carotid artery shows atherosclerotic plaque but without flow limiting stenosis proximal to the bifurcation. Dense calcified plaque at the carotid bifurcation and ICA bulb. Minimal diameter in the ICA bulb is 1.5 mm. Compared to a more distal cervical ICA of 5 mm, this indicates a 70% stenosis. Left carotid system: Common carotid artery shows scattered plaque but is sufficiently patent to the bifurcation region. Dense calcified plaque at the carotid bifurcation and ICA bulb. Minimal diameter in the ICA bulb is 2.5 mm. Compared to a more distal cervical ICA diameter of 5 mm, this indicates a 50% stenosis. Vertebral arteries: Both subclavian arteries show atherosclerotic disease but without a flow limiting stenosis. Both vertebral arteries are patent with stenosis of 50% at their origins. Beyond that, the vessels are patent through the cervical region to the foramen magnum. Skeleton: Ordinary cervical spondylosis. Chronic arthropathy at the C1-2 articulation. Other neck: No mass or lymphadenopathy in the neck. Upper chest: Mild scarring at the lung apices.  No active process. Review of the MIP images confirms the above findings CTA HEAD FINDINGS Anterior circulation: Both internal carotid arteries are patent through the skull base and siphon regions. There is calcification in the carotid siphon regions with stenosis estimated at 30% on both sides. The anterior and middle cerebral vessels are patent without proximal stenosis, aneurysm or vascular malformation. No large or  medium vessel occlusion identified. Posterior circulation: Both vertebral arteries are patent through the foramen magnum to the basilar. Focal plaque in the left vertebral V4 segment with stenosis of 30%. No basilar stenosis. Major posterior circulation branch vessels are patent. Left PCA receives its supply from the anterior circulation. Venous sinuses: Patent and normal. Anatomic variants: None Review of the MIP images confirms the above findings IMPRESSION: 1. Dense calcified plaque at both carotid bifurcations and ICA bulb regions. 70% stenosis of the ICA bulb on the right. 50% stenosis of the ICA bulb on the left. 2. 50% stenoses at both vertebral artery origins. 30% stenosis  of the left vertebral V4 segment. 3. No intracranial large or medium vessel occlusion or correctable proximal stenosis. Aortic Atherosclerosis (ICD10-I70.0). Electronically Signed   By: Nelson Chimes M.D.   On: 01/28/2021 19:56   CT ANGIO NECK W OR WO CONTRAST  Result Date: 01/28/2021 CLINICAL DATA:  Stroke. TIA. Left cerebellar and basal ganglia strokes. EXAM: CT ANGIOGRAPHY HEAD AND NECK TECHNIQUE: Multidetector CT imaging of the head and neck was performed using the standard protocol during bolus administration of intravenous contrast. Multiplanar CT image reconstructions and MIPs were obtained to evaluate the vascular anatomy. Carotid stenosis measurements (when applicable) are obtained utilizing NASCET criteria, using the distal internal carotid diameter as the denominator. CONTRAST:  72mL OMNIPAQUE IOHEXOL 350 MG/ML SOLN COMPARISON:  CT and MRI studies done yesterday. FINDINGS: CT HEAD FINDINGS Brain: Focal low-density now evident in the left cerebellum. No evidence of hemorrhagic transformation. Cerebral hemispheres show a background pattern of extensive chronic small vessel disease of the white matter. The acute infarction in the left basal ganglia and radiating white matter tracts is barely discernible. No hemorrhagic  transformation. No new insult. No hydrocephalus or extra-axial collection. Vascular: There is atherosclerotic calcification of the major vessels at the base of the brain. Skull: Negative Sinuses: Clear Orbits: Normal Review of the MIP images confirms the above findings CTA NECK FINDINGS Aortic arch: Aortic atherosclerotic calcification. Branching pattern is normal without flow limiting stenosis. Right carotid system: Common carotid artery shows atherosclerotic plaque but without flow limiting stenosis proximal to the bifurcation. Dense calcified plaque at the carotid bifurcation and ICA bulb. Minimal diameter in the ICA bulb is 1.5 mm. Compared to a more distal cervical ICA of 5 mm, this indicates a 70% stenosis. Left carotid system: Common carotid artery shows scattered plaque but is sufficiently patent to the bifurcation region. Dense calcified plaque at the carotid bifurcation and ICA bulb. Minimal diameter in the ICA bulb is 2.5 mm. Compared to a more distal cervical ICA diameter of 5 mm, this indicates a 50% stenosis. Vertebral arteries: Both subclavian arteries show atherosclerotic disease but without a flow limiting stenosis. Both vertebral arteries are patent with stenosis of 50% at their origins. Beyond that, the vessels are patent through the cervical region to the foramen magnum. Skeleton: Ordinary cervical spondylosis. Chronic arthropathy at the C1-2 articulation. Other neck: No mass or lymphadenopathy in the neck. Upper chest: Mild scarring at the lung apices.  No active process. Review of the MIP images confirms the above findings CTA HEAD FINDINGS Anterior circulation: Both internal carotid arteries are patent through the skull base and siphon regions. There is calcification in the carotid siphon regions with stenosis estimated at 30% on both sides. The anterior and middle cerebral vessels are patent without proximal stenosis, aneurysm or vascular malformation. No large or medium vessel occlusion  identified. Posterior circulation: Both vertebral arteries are patent through the foramen magnum to the basilar. Focal plaque in the left vertebral V4 segment with stenosis of 30%. No basilar stenosis. Major posterior circulation branch vessels are patent. Left PCA receives its supply from the anterior circulation. Venous sinuses: Patent and normal. Anatomic variants: None Review of the MIP images confirms the above findings IMPRESSION: 1. Dense calcified plaque at both carotid bifurcations and ICA bulb regions. 70% stenosis of the ICA bulb on the right. 50% stenosis of the ICA bulb on the left. 2. 50% stenoses at both vertebral artery origins. 30% stenosis of the left vertebral V4 segment. 3. No intracranial large or medium vessel occlusion  or correctable proximal stenosis. Aortic Atherosclerosis (ICD10-I70.0). Electronically Signed   By: Nelson Chimes M.D.   On: 01/28/2021 19:56   ECHOCARDIOGRAM COMPLETE  Result Date: 01/29/2021    ECHOCARDIOGRAM REPORT   Patient Name:   Carol [name] Date of Exam: 01/29/2021 Medical Rec #:  [id number]           Height:       62.0 in Accession #:    [id number]          Weight:       141.8 lb Date of Birth:  09-02-1928            BSA:          1.652 m Patient Age:    41 years            BP:           161/92 mmHg Patient Gender: F                   HR:           116 bpm. Exam Location:  Inpatient Procedure: 2D Echo Indications:    Stroke 434.91 / I163.9  History:        Patient has prior history of Echocardiogram examinations, most                 recent 01/17/2012. CAD, Prior CABG, Stroke, Arrythmias:Atrial                 Fibrillation; Risk Factors:Dyslipidemia, Former Smoker and                 Hypertension. Sepsis, 19 mm Magna Ease pericardial tissue valve                 12/28/2011.                 Aortic Valve: 19 mm Edwards MagnaEase valve is present in the                 aortic position.  Sonographer:    Leavy Cella Referring Phys: [place]  Comments: Restricted Mobility. IMPRESSIONS  1. Left ventricular ejection fraction, by estimation, is 60 to 65%. The left ventricle has normal function. The left ventricle has no regional wall motion abnormalities. Left ventricular diastolic function could not be evaluated.  2. Right ventricular systolic function is normal. The right ventricular size is normal. There is normal pulmonary artery systolic pressure.  3. The mitral valve is abnormal. Trivial mitral valve regurgitation. Moderate mitral annular calcification.  4. The aortic valve has been repaired/replaced. Aortic valve regurgitation is trivial. There is a 19 mm Edwards MagnaEase valve present in the aortic position. Echo findings are consistent with normal structure and function of the aortic valve prosthesis. Aortic valve mean gradient measures 14.3 mmHg. Aortic valve Vmax measures 2.44 m/s.  5. The inferior vena cava is normal in size with greater than 50% respiratory variability, suggesting right atrial pressure of 3 mmHg. Comparison(s): Prior images unable to be directly viewed, comparison made by report only. Changes from prior study are noted. 01/17/12: LVEF 55-60%, aortic bioprosthesis -mean gradient 22 mmHg. FINDINGS  Left Ventricle: Left ventricular ejection fraction, by estimation, is 60 to 65%. The left ventricle has normal function. The left ventricle has no regional wall motion abnormalities. The left ventricular internal cavity size was normal in size. There is  no left ventricular hypertrophy. Left ventricular diastolic function could not be  evaluated due to atrial fibrillation. Left ventricular diastolic function could not be evaluated. Right Ventricle: The right ventricular size is normal. No increase in right ventricular wall thickness. Right ventricular systolic function is normal. There is normal pulmonary artery systolic pressure. The tricuspid regurgitant velocity is 2.43 m/s, and  with an assumed right atrial pressure of 3 mmHg, the  estimated right ventricular systolic pressure is 30.1 mmHg. Left Atrium: Left atrial size was normal in size. Right Atrium: Right atrial size was normal in size. Pericardium: There is no evidence of pericardial effusion. Mitral Valve: The mitral valve is abnormal. There is mild thickening of the mitral valve leaflet(s). Moderate mitral annular calcification. Trivial mitral valve regurgitation. Tricuspid Valve: The tricuspid valve is grossly normal. Tricuspid valve regurgitation is trivial. Aortic Valve: The aortic valve has been repaired/replaced. Aortic valve regurgitation is trivial. Aortic valve mean gradient measures 14.3 mmHg. Aortic valve peak gradient measures 23.9 mmHg. Aortic valve area, by VTI measures 0.53 cm. There is a 19 mm Edwards MagnaEase valve present in the aortic position. Echo findings are consistent with normal structure and function of the aortic valve prosthesis. Pulmonic Valve: The pulmonic valve was normal in structure. Pulmonic valve regurgitation is trivial. Aorta: The aortic root and ascending aorta are structurally normal, with no evidence of dilitation. Venous: The inferior vena cava is normal in size with greater than 50% respiratory variability, suggesting right atrial pressure of 3 mmHg. IAS/Shunts: No atrial level shunt detected by color flow Doppler.  LEFT VENTRICLE PLAX 2D LVOT diam:     1.90 cm  Diastology LV SV:         23       LV e' medial:   3.70 cm/s LV SV Index:   14       LV E/e' medial: 34.3 LVOT Area:     2.84 cm  RIGHT VENTRICLE RV S prime:     14.20 cm/s TAPSE (M-mode): 1.5 cm LEFT ATRIUM             Index       RIGHT ATRIUM           Index LA diam:        3.60 cm 2.18 cm/m  RA Area:     16.20 cm LA Vol (A2C):   49.1 ml 29.73 ml/m RA Volume:   46.60 ml  28.22 ml/m LA Vol (A4C):   47.9 ml 29.00 ml/m LA Biplane Vol: 53.2 ml 32.21 ml/m  AORTIC VALVE AV Area (Vmax):    0.59 cm AV Area (Vmean):   0.57 cm AV Area (VTI):     0.53 cm AV Vmax:           244.33 cm/s  AV Vmean:          174.000 cm/s AV VTI:            0.432 m AV Peak Grad:      23.9 mmHg AV Mean Grad:      14.3 mmHg LVOT Vmax:         51.20 cm/s LVOT Vmean:        35.267 cm/s LVOT VTI:          0.080 m LVOT/AV VTI ratio: 0.19  AORTA Ao Root diam: 2.40 cm MITRAL VALVE                TRICUSPID VALVE MV Area (PHT): 6.71 cm     TR Peak grad:   23.6 mmHg MV Decel Time: 113  msec     TR Vmax:        243.00 cm/s MV E velocity: 127.00 cm/s MV A velocity: 58.10 cm/s   SHUNTS MV E/A ratio:  2.19         Systemic VTI:  0.08 m                             Systemic Diam: 1.90 cm Lyman Bishop MD Electronically signed by Lyman Bishop MD Signature Date/Time: 01/29/2021/12:51:58 PM    Final     Cardiac Studies   No new    Assessment & Plan    Ms. Cente is a 87 year old woman with a history of coronary artery disease post CABG and paroxysmal atrial fibrillation and flutter.  She was admitted with a stroke.  Her A. fib is currently not rate controlled due to her metoprolol being held.    Atrial fibrillation with rapid ventricular rates.  Also with history of nocturnal high degree AV block on monitor 10/2020, so only taking metoprolol dose in AM.   -Currently appears in sinus tachycardia with PACs/PVCs though does appear to be some changes in P wave morphology, may represent MAT.  Will check EKG -Continue metoprolol 25 mg by mouth once daily.  -Continue Eliquis 5 mg by mouth twice daily for secondary prophylaxis of stroke.  Dose was previously 2.5 mg BID, increased after CVA.    For questions or updates, please contact [facility] Please consult [email or web address] for contact info under Cardiology/STEMI.      Signed, Lars Mage, MD  01/30/2021, 9:57 AM

## 2021-01-31 ENCOUNTER — Other Ambulatory Visit: Payer: Self-pay | Admitting: Cardiology

## 2021-01-31 DIAGNOSIS — I639 Cerebral infarction, unspecified: Secondary | ICD-10-CM | POA: Diagnosis not present

## 2021-01-31 DIAGNOSIS — I491 Atrial premature depolarization: Secondary | ICD-10-CM | POA: Diagnosis not present

## 2021-01-31 DIAGNOSIS — I493 Ventricular premature depolarization: Secondary | ICD-10-CM | POA: Diagnosis not present

## 2021-01-31 DIAGNOSIS — I4891 Unspecified atrial fibrillation: Secondary | ICD-10-CM | POA: Diagnosis not present

## 2021-01-31 LAB — BASIC METABOLIC PANEL
Anion gap: 10 (ref 5–15)
BUN: 24 mg/dL — ABNORMAL HIGH (ref 8–23)
CO2: 22 mmol/L (ref 22–32)
Calcium: 8.5 mg/dL — ABNORMAL LOW (ref 8.9–10.3)
Chloride: 99 mmol/L (ref 98–111)
Creatinine, Ser: 0.89 mg/dL (ref 0.44–1.00)
GFR, Estimated: >60 mL/min (ref >60)
Glucose, Bld: 111 mg/dL — ABNORMAL HIGH (ref 70–99)
Potassium: 4 mmol/L (ref 3.5–5.1)
Sodium: 131 mmol/L — ABNORMAL LOW (ref 135–145)

## 2021-01-31 LAB — MAGNESIUM: Magnesium: 2 mg/dL (ref 1.7–2.4)

## 2021-01-31 MED ORDER — BISACODYL 10 MG RE SUPP
10.0000 mg | Freq: Once | RECTAL | Status: AC
  Administered 2021-01-31: 10 mg via RECTAL
  Filled 2021-01-31: qty 1

## 2021-01-31 MED ORDER — METOPROLOL TARTRATE 50 MG PO TABS
50.0000 mg | ORAL_TABLET | Freq: Every day | ORAL | Status: DC
Start: 2021-01-31 — End: 2021-02-01
  Administered 2021-01-31: 50 mg via ORAL
  Filled 2021-01-31 (×2): qty 1

## 2021-01-31 MED ORDER — POLYETHYLENE GLYCOL 3350 17 G PO PACK
17.0000 g | PACK | Freq: Every day | ORAL | Status: DC
  Administered 2021-01-31 – 2021-02-01 (×2): 17 g via ORAL
  Filled 2021-01-31 (×2): qty 1

## 2021-01-31 MED ORDER — WHITE PETROLATUM EX OINT
TOPICAL_OINTMENT | CUTANEOUS | Status: AC
  Filled 2021-01-31: qty 28.35

## 2021-01-31 NOTE — Telephone Encounter (Signed)
Refill request received for eliquis 2.5mg  but pt qualifies for 5mg  will route to pharmd pool. 81f, 66.7kg, pt is currently admitted. Lovw/revankar (01/20/21) Creatinine, Ser Completed today  0.44 - 1.00 mg/dL 0.89

## 2021-01-31 NOTE — Progress Notes (Signed)
PROGRESS NOTE    Carol [name]  NID:[id number] DOB: 1928-05-05 DOA: 01/27/2021 PCP: Lujean Amel, MD   Chief Complain: Right-sided weakness, slurred speech  Brief Narrative: Patient is a 87 year old female who presented to the emergency department with complaints of right leg weakness/slurred speech.  Work-up on presentation showed acute CVA.  Hospital course also remarkable for UTI, A. fib with RVR.  Cardiology and neurology were following.  Plan for skilled nursing facility placement when hemodynamically stable.  Hospitalist remarkable for sinus tachycardia with PVCs.  Assessment & Plan:   Principal Problem:   Acute CVA (cerebrovascular accident) [facility]) Active Problems:   Hyperlipidemia   Hypothyroidism   Chronic diastolic heart failure (HCC)   Atrial fibrillation (HCC)   AV heart block   Right leg weakness   Acute nonhemorrhagic CVA: Presented with slurred speech, right leg weakness.  MRI showed acute infarcts within the left cerebral hemisphere.  Stroke work-up initiated.  Neurology was following.  Stroke suspected to be embolic in etiology likely from A. fib with RVR and she was on subtherapeutic dose. Stroke work-up has been completed.  Eliquis dose has been increased to 5 mg twice daily.  Continue Lipitor.  Echo showed ejection fraction of 60-65%, no intracardiac source of emboli.  Hemoglobin A1c of 4.5.  TSH is normal.  LDL 48 PT/OT recommending skilled nursing facility on discharge. She should follow-up with [facility] neurology in 4 weeks.  Acute metabolic encephalopathy: At baseline, she is alert and oriented.  She is from independent living facility.  Walks with help of walker/cane.  Acute encephalopathy is thought to be secondary to UTI/acute CVA.  Continue monitoring mental status.  Delirium precautions.  Mental status has improved. She is alert and oriented today.  A. fib with RVR/sinus tachycardia with PVCs: Cardiology following.  Currently on metoprolol.  She was  evaluated by EP sometime back.  Continue monitoring on telemetry.  She has been tachycardic recently with heart rate in the range of 110-120.  EKG showed sinus tachycardia with PVCs/Pacs.  Dose of metoprolol increased to 50 mg daily.  History of coronary artery disease: Status post CABG in 2013, has history of severe aortic stenosis status post tissue AVR in 2013.  Follows with cardiology.  Chronic diastolic congestive heart failure: Currently appears euvolemic.  She was also orthostatic and was started on gentle IV fluids, which has been stopped now.  UTI: Urine culture showed Klebsiella pneumoniae, continue antibiotics for 5 days course.  Orthostatic hypotension: Started on gentle IV fluids,now stopped.  Cortisol unremarkable.  Continue to monitor  Hyponatremia: Could be from dehydration/poor oral intake and was given IV fluids.  Sodium levels stable in the range of 130s.  Continue to monitor  History of hemolytic anemia:On chronic  prednisone therapy.  Hypothyroidism: Synthyroid  Constipation: Continue aggressive bowel regimen.  Debility/deconditioning: PT/OT recommended  Skilled nursing facility on discharge.             DVT prophylaxis:Eliquis Code Status: DNR Family Communication: daughter on phone on 01/31/21 Status is: Inpatient  Remains inpatient appropriate because:Inpatient level of care appropriate due to severity of illness   Dispo: The patient is from: Home              Anticipated d/c is to: SNF              Anticipated d/c date is: 1-2 days              Patient currently is not medically stable to d/c.  Difficult to place patient No      Consultants: Cardiology, neurology  Procedures: None  Antimicrobials:  Anti-infectives (From admission, onward)   Start     Dose/Rate Route Frequency Ordered Stop   01/30/21 1100  cephALEXin (KEFLEX) capsule 500 mg        500 mg Oral Every 12 hours 01/30/21 1045 02/04/21 0959   01/27/21 1045  cephALEXin (KEFLEX)  capsule 500 mg        500 mg Oral Once 01/27/21 1030 01/27/21 1216      Subjective:  Patient seen and examined at the bedside this morning.  Still on sinus tachycardia this morning with heart rate ranging from 110-120.  Denies any chest pain, shortness of breath or palpitations.  She was complaining of constipation Objective: Vitals:   01/30/21 1932 01/30/21 2313 01/31/21 0318 01/31/21 0908  BP: (!) 133/92 (!) 154/85 127/73 119/87  Pulse: (!) 109 (!) 108 99 (!) 114  Resp: 18 19 18 20   Temp: 98.6 F (37 C) 98.8 F (37.1 C) 98.5 F (36.9 C) 98 F (36.7 C)  TempSrc: Oral Oral Oral Oral  SpO2: 97% 96% 93% 96%  Weight:      Height:        Intake/Output Summary (Last 24 hours) at 01/31/2021 1119 Last data filed at 01/31/2021 8099 Gross per 24 hour  Intake 117 ml  Output --  Net 117 ml   Filed Weights   01/27/21 1810  Weight: 64.3 kg    Examination:   General exam: Very deconditioned, debilitated elderly female Respiratory system: Bilateral diminished air sounds but no wheezes or crackles cardiovascular system: Sinus tachycardia. No JVD, murmurs, rubs, gallops or clicks. Gastrointestinal system: Abdomen is distended, soft and nontender. No organomegaly or masses felt. Normal bowel sounds heard. Central nervous system: Alert and oriented. No focal neurological deficits. Extremities: No edema, no clubbing ,no cyanosis Skin: Scattered purpura on the skin      Data Reviewed: I have personally reviewed following labs and imaging studies  CBC: Recent Labs  Lab 01/27/21 0643 01/27/21 0647  WBC 8.5  --   NEUTROABS 5.8  --   HGB 12.4 13.3  HCT 38.6 39.0  MCV 90.2  --   PLT 204  --    Basic Metabolic Panel: Recent Labs  Lab 01/27/21 0643 01/27/21 0647 01/29/21 0202 01/30/21 0253 01/31/21 0140  NA 132* 134* 131* 130* 131*  K 4.0 4.0 3.8 3.7 4.0  CL 97* 97* 98 98 99  CO2 26  --  25 23 22   GLUCOSE 115* 106* 108* 103* 111*  BUN 18 21 20 23  24*  CREATININE 1.04*  1.00 1.00 1.02* 0.89  CALCIUM 9.3  --  8.8* 8.0* 8.5*  MG  --   --  2.1  --   --    GFR: Estimated Creatinine Clearance: 36.3 mL/min (by C-G formula based on SCr of 0.89 mg/dL). Liver Function Tests: Recent Labs  Lab 01/27/21 0643  AST 22  ALT 17  ALKPHOS 54  BILITOT 1.0  PROT 6.5  ALBUMIN 3.8   No results for input(s): LIPASE, AMYLASE in the last 168 hours. No results for input(s): AMMONIA in the last 168 hours. Coagulation Profile: Recent Labs  Lab 01/27/21 0643  INR 1.1   Cardiac Enzymes: No results for input(s): CKTOTAL, CKMB, CKMBINDEX, TROPONINI in the last 168 hours. BNP (last 3 results) No results for input(s): PROBNP in the last 8760 hours. HbA1C: No results for input(s): HGBA1C in the last  72 hours. CBG: Recent Labs  Lab 01/27/21 0647  GLUCAP 108*   Lipid Profile: No results for input(s): CHOL, HDL, LDLCALC, TRIG, CHOLHDL, LDLDIRECT in the last 72 hours. Thyroid Function Tests: No results for input(s): TSH, T4TOTAL, FREET4, T3FREE, THYROIDAB in the last 72 hours. Anemia Panel: No results for input(s): VITAMINB12, FOLATE, FERRITIN, TIBC, IRON, RETICCTPCT in the last 72 hours. Sepsis Labs: No results for input(s): PROCALCITON, LATICACIDVEN in the last 168 hours.  Recent Results (from the past 240 hour(s))  Urine culture     Status: Abnormal   Collection Time: 01/27/21  5:32 PM   Specimen: Urine, Random  Result Value Ref Range Status   Specimen Description URINE, RANDOM  Final   Special Requests   Final    NONE Performed at [facility], [address], [place], [place] [address]    Culture >=100,000 COLONIES/mL KLEBSIELLA PNEUMONIAE (A)  Final   Report Status 01/29/2021 FINAL  Final   Organism ID, Bacteria KLEBSIELLA PNEUMONIAE (A)  Final      Susceptibility   Klebsiella pneumoniae - MIC*    AMPICILLIN >=32 RESISTANT Resistant     CEFAZOLIN <=4 SENSITIVE Sensitive     CEFEPIME <=0.12 SENSITIVE Sensitive     CEFTRIAXONE <=0.25 SENSITIVE  Sensitive     CIPROFLOXACIN <=0.25 SENSITIVE Sensitive     GENTAMICIN <=1 SENSITIVE Sensitive     IMIPENEM <=0.25 SENSITIVE Sensitive     NITROFURANTOIN 32 SENSITIVE Sensitive     TRIMETH/SULFA >=320 RESISTANT Resistant     AMPICILLIN/SULBACTAM >=32 RESISTANT Resistant     PIP/TAZO <=4 SENSITIVE Sensitive     * >=100,000 COLONIES/mL KLEBSIELLA PNEUMONIAE  SARS CORONAVIRUS 2 (TAT 6-24 HRS) Nasopharyngeal Nasopharyngeal Swab     Status: None   Collection Time: 01/28/21  9:58 AM   Specimen: Nasopharyngeal Swab  Result Value Ref Range Status   SARS Coronavirus 2 NEGATIVE NEGATIVE Final    Comment: (NOTE) SARS-CoV-2 target nucleic acids are NOT DETECTED.  The SARS-CoV-2 RNA is generally detectable in upper and lower respiratory specimens during the acute phase of infection. Negative results do not preclude SARS-CoV-2 infection, do not rule out co-infections with other pathogens, and should not be used as the sole basis for treatment or other patient management decisions. Negative results must be combined with clinical observations, patient history, and epidemiological information. The expected result is Negative.  Fact Sheet for Patients: SugarRoll.be  Fact Sheet for Healthcare Providers: https://www.woods-mathews.com/  This test is not yet approved or cleared by the [place] FDA and  has been authorized for detection and/or diagnosis of SARS-CoV-2 by FDA under an Emergency Use Authorization (EUA). This EUA will remain  in effect (meaning this test can be used) for the duration of the COVID-19 declaration under Se ction 564(b)(1) of the Act, 21 U.S.C. section 360bbb-3(b)(1), unless the authorization is terminated or revoked sooner.  Performed at [facility], [place] [address], [place], [place] [address]   SARS Coronavirus 2 by RT PCR (hospital order, performed in [facility] hospital lab) Nasopharyngeal Nasopharyngeal Swab      Status: None   Collection Time: 01/30/21 11:10 AM   Specimen: Nasopharyngeal Swab  Result Value Ref Range Status   SARS Coronavirus 2 NEGATIVE NEGATIVE Final    Comment: (NOTE) SARS-CoV-2 target nucleic acids are NOT DETECTED.  The SARS-CoV-2 RNA is generally detectable in upper and lower respiratory specimens during the acute phase of infection. The lowest concentration of SARS-CoV-2 viral copies this assay can detect  is 250 copies / mL. A negative result does not preclude SARS-CoV-2 infection and should not be used as the sole basis for treatment or other patient management decisions.  A negative result may occur with improper specimen collection / handling, submission of specimen other than nasopharyngeal swab, presence of viral mutation(s) within the areas targeted by this assay, and inadequate number of viral copies (<250 copies / mL). A negative result must be combined with clinical observations, patient history, and epidemiological information.  Fact Sheet for Patients:   StrictlyIdeas.no  Fact Sheet for Healthcare Providers: BankingDealers.co.za  This test is not yet approved or  cleared by the [place] FDA and has been authorized for detection and/or diagnosis of SARS-CoV-2 by FDA under an Emergency Use Authorization (EUA).  This EUA will remain in effect (meaning this test can be used) for the duration of the COVID-19 declaration under Section 564(b)(1) of the Act, 21 U.S.C. section 360bbb-3(b)(1), unless the authorization is terminated or revoked sooner.  Performed at [facility], [place] [address] [place], [place] [address]          Radiology Studies: No results found.      Scheduled Meds: . ALPRAZolam  0.25 mg Oral QHS  . apixaban  5 mg Oral BID  . atorvastatin  20 mg Oral QHS  . bisacodyl  10 mg Rectal Once  . cephALEXin  500 mg Oral Q12H  . gabapentin  100 mg Oral TID  . levothyroxine  88  mcg Oral QAC breakfast  . loratadine  10 mg Oral Daily  . metoprolol tartrate  50 mg Oral Daily  . nystatin   Topical BID  . oxybutynin  5 mg Oral QODAY  . polyethylene glycol  17 g Oral Daily  . predniSONE  5 mg Oral Q breakfast  . senna  2 tablet Oral Daily   Continuous Infusions:    LOS: 4 days    Time spent: 35 mins.More than 50% of that time was spent in counseling and/or coordination of care.      Shelly Coss, MD [facility] P2/07/2021, 11:19 AM

## 2021-01-31 NOTE — TOC Progression Note (Signed)
Transition of Care [facility]) - Progression Note    Patient Details  Name: Carol [name] MRN: [id number] Date of Birth: Mar 25, 1928  Transition of Care [facility]) CM/SW [place], [place] Phone Number: 01/31/2021, 11:21 AM  Clinical Narrative:   CSW received call from [facility] that patient's PASRR was not on her FL2. CSW looked up PASRR on [facility]CMust and updated FL2, sent to [facility]. Per MD, not medically ready for discharge today. CSW updated [facility]. CSW to follow.    Expected Discharge Plan: [place] Barriers to Discharge: Continued Medical Work up  Expected Discharge Plan and Services Expected Discharge Plan: [place] Choice: [place] arrangements for the past 2 months: Assisted Living Facility                                       Social Determinants of Health (SDOH) Interventions    Readmission Risk Interventions No flowsheet data found.

## 2021-01-31 NOTE — Progress Notes (Signed)
Progress Note  Patient Name: Carol [name] Date of Encounter: 01/31/2021  Primary Cardiologist: Jenean Lindau, MD   Subjective   Denies any dyspnea, chest pain, or palpitations.  Does report lightheadedness, denies any syncope  Inpatient Medications    Scheduled Meds: . ALPRAZolam  0.25 mg Oral QHS  . apixaban  5 mg Oral BID  . atorvastatin  20 mg Oral QHS  . bisacodyl  10 mg Rectal Once  . cephALEXin  500 mg Oral Q12H  . gabapentin  100 mg Oral TID  . levothyroxine  88 mcg Oral QAC breakfast  . loratadine  10 mg Oral Daily  . metoprolol tartrate  25 mg Oral Daily  . nystatin   Topical BID  . oxybutynin  5 mg Oral QODAY  . polyethylene glycol  17 g Oral Daily  . predniSONE  5 mg Oral Q breakfast  . senna  2 tablet Oral Daily   Continuous Infusions:  PRN Meds: acetaminophen **OR** acetaminophen (TYLENOL) oral liquid 160 mg/5 mL **OR** acetaminophen, metoprolol tartrate   Vital Signs    Vitals:   01/30/21 1932 01/30/21 2313 01/31/21 0318 01/31/21 0908  BP: (!) 133/92 (!) 154/85 127/73 119/87  Pulse: (!) 109 (!) 108 99 (!) 114  Resp: 18 19 18 20   Temp: 98.6 F (37 C) 98.8 F (37.1 C) 98.5 F (36.9 C) 98 F (36.7 C)  TempSrc: Oral Oral Oral Oral  SpO2: 97% 96% 93% 96%  Weight:      Height:        Intake/Output Summary (Last 24 hours) at 01/31/2021 1040 Last data filed at 01/31/2021 9476 Gross per 24 hour  Intake 117 ml  Output -  Net 117 ml   Filed Weights   01/27/21 1810  Weight: 64.3 kg    Telemetry    Sinus tachycardia with PACs/PVCs - Personally Reviewed  ECG    ST with PACs/PVCs- Personally Reviewed  Physical Exam   GEN: No acute distress.   Neck: No JVD Cardiac: tachycardic, irregular, no murmurs Respiratory: Clear to auscultation bilaterally. GI: Soft MS: No edema; Ecchymosis Neuro:  slurred speech, weakness.  Psych: Normal affect   Labs    Chemistry Recent Labs  Lab 01/27/21 [phone] 01/27/21 0647 01/29/21 0202  01/30/21 0253 01/31/21 0140  NA 132*   < > 131* 130* 131*  K 4.0   < > 3.8 3.7 4.0  CL 97*   < > 98 98 99  CO2 26  --  25 23 22   GLUCOSE 115*   < > 108* 103* 111*  BUN 18   < > 20 23 24*  CREATININE 1.04*   < > 1.00 1.02* 0.89  CALCIUM 9.3  --  8.8* 8.0* 8.5*  PROT 6.5  --   --   --   --   ALBUMIN 3.8  --   --   --   --   AST 22  --   --   --   --   ALT 17  --   --   --   --   ALKPHOS 54  --   --   --   --   BILITOT 1.0  --   --   --   --   GFRNONAA 51*  --  53* 52* >60  ANIONGAP 9  --  8 9 10    < > = values in this interval not displayed.     Hematology Recent Labs  Lab 01/27/21  6384 01/27/21 0647  WBC 8.5  --   RBC 4.28  --   HGB 12.4 13.3  HCT 38.6 39.0  MCV 90.2  --   MCH 29.0  --   MCHC 32.1  --   RDW 18.2*  --   PLT 204  --     Cardiac EnzymesNo results for input(s): TROPONINI in the last 168 hours. No results for input(s): TROPIPOC in the last 168 hours.   BNPNo results for input(s): BNP, PROBNP in the last 168 hours.   DDimer No results for input(s): DDIMER in the last 168 hours.   Radiology    ECHOCARDIOGRAM COMPLETE  Result Date: 01/29/2021    ECHOCARDIOGRAM REPORT   Patient Name:   Carol [name] Date of Exam: 01/29/2021 Medical Rec #:  [id number]           Height:       62.0 in Accession #:    [id number]          Weight:       141.8 lb Date of Birth:  March 24, 1928            BSA:          1.652 m Patient Age:    87 years            BP:           161/92 mmHg Patient Gender: F                   HR:           116 bpm. Exam Location:  Inpatient Procedure: 2D Echo Indications:    Stroke 434.91 / I163.9  History:        Patient has prior history of Echocardiogram examinations, most                 recent 01/17/2012. CAD, Prior CABG, Stroke, Arrythmias:Atrial                 Fibrillation; Risk Factors:Dyslipidemia, Former Smoker and                 Hypertension. Sepsis, 19 mm Magna Ease pericardial tissue valve                 12/28/2011.                 Aortic Valve:  19 mm Edwards MagnaEase valve is present in the                 aortic position.  Sonographer:    Leavy Cella Referring Phys: [place] Comments: Restricted Mobility. IMPRESSIONS  1. Left ventricular ejection fraction, by estimation, is 60 to 65%. The left ventricle has normal function. The left ventricle has no regional wall motion abnormalities. Left ventricular diastolic function could not be evaluated.  2. Right ventricular systolic function is normal. The right ventricular size is normal. There is normal pulmonary artery systolic pressure.  3. The mitral valve is abnormal. Trivial mitral valve regurgitation. Moderate mitral annular calcification.  4. The aortic valve has been repaired/replaced. Aortic valve regurgitation is trivial. There is a 19 mm Edwards MagnaEase valve present in the aortic position. Echo findings are consistent with normal structure and function of the aortic valve prosthesis. Aortic valve mean gradient measures 14.3 mmHg. Aortic valve Vmax measures 2.44 m/s.  5. The inferior vena cava is normal in size with greater than 50% respiratory variability, suggesting right  atrial pressure of 3 mmHg. Comparison(s): Prior images unable to be directly viewed, comparison made by report only. Changes from prior study are noted. 01/17/12: LVEF 55-60%, aortic bioprosthesis -mean gradient 22 mmHg. FINDINGS  Left Ventricle: Left ventricular ejection fraction, by estimation, is 60 to 65%. The left ventricle has normal function. The left ventricle has no regional wall motion abnormalities. The left ventricular internal cavity size was normal in size. There is  no left ventricular hypertrophy. Left ventricular diastolic function could not be evaluated due to atrial fibrillation. Left ventricular diastolic function could not be evaluated. Right Ventricle: The right ventricular size is normal. No increase in right ventricular wall thickness. Right ventricular systolic function is  normal. There is normal pulmonary artery systolic pressure. The tricuspid regurgitant velocity is 2.43 m/s, and  with an assumed right atrial pressure of 3 mmHg, the estimated right ventricular systolic pressure is 40.0 mmHg. Left Atrium: Left atrial size was normal in size. Right Atrium: Right atrial size was normal in size. Pericardium: There is no evidence of pericardial effusion. Mitral Valve: The mitral valve is abnormal. There is mild thickening of the mitral valve leaflet(s). Moderate mitral annular calcification. Trivial mitral valve regurgitation. Tricuspid Valve: The tricuspid valve is grossly normal. Tricuspid valve regurgitation is trivial. Aortic Valve: The aortic valve has been repaired/replaced. Aortic valve regurgitation is trivial. Aortic valve mean gradient measures 14.3 mmHg. Aortic valve peak gradient measures 23.9 mmHg. Aortic valve area, by VTI measures 0.53 cm. There is a 19 mm Edwards MagnaEase valve present in the aortic position. Echo findings are consistent with normal structure and function of the aortic valve prosthesis. Pulmonic Valve: The pulmonic valve was normal in structure. Pulmonic valve regurgitation is trivial. Aorta: The aortic root and ascending aorta are structurally normal, with no evidence of dilitation. Venous: The inferior vena cava is normal in size with greater than 50% respiratory variability, suggesting right atrial pressure of 3 mmHg. IAS/Shunts: No atrial level shunt detected by color flow Doppler.  LEFT VENTRICLE PLAX 2D LVOT diam:     1.90 cm  Diastology LV SV:         23       LV e' medial:   3.70 cm/s LV SV Index:   14       LV E/e' medial: 34.3 LVOT Area:     2.84 cm  RIGHT VENTRICLE RV S prime:     14.20 cm/s TAPSE (M-mode): 1.5 cm LEFT ATRIUM             Index       RIGHT ATRIUM           Index LA diam:        3.60 cm 2.18 cm/m  RA Area:     16.20 cm LA Vol (A2C):   49.1 ml 29.73 ml/m RA Volume:   46.60 ml  28.22 ml/m LA Vol (A4C):   47.9 ml 29.00 ml/m  LA Biplane Vol: 53.2 ml 32.21 ml/m  AORTIC VALVE AV Area (Vmax):    0.59 cm AV Area (Vmean):   0.57 cm AV Area (VTI):     0.53 cm AV Vmax:           244.33 cm/s AV Vmean:          174.000 cm/s AV VTI:            0.432 m AV Peak Grad:      23.9 mmHg AV Mean Grad:      14.3 mmHg  LVOT Vmax:         51.20 cm/s LVOT Vmean:        35.267 cm/s LVOT VTI:          0.080 m LVOT/AV VTI ratio: 0.19  AORTA Ao Root diam: 2.40 cm MITRAL VALVE                TRICUSPID VALVE MV Area (PHT): 6.71 cm     TR Peak grad:   23.6 mmHg MV Decel Time: 113 msec     TR Vmax:        243.00 cm/s MV E velocity: 127.00 cm/s MV A velocity: 58.10 cm/s   SHUNTS MV E/A ratio:  2.19         Systemic VTI:  0.08 m                             Systemic Diam: 1.90 cm Lyman Bishop MD Electronically signed by Lyman Bishop MD Signature Date/Time: 01/29/2021/12:51:58 PM    Final     Cardiac Studies   No new   Assessment & Plan    Carol [name] is a 87 year old woman with a history of coronary artery disease post CABG and paroxysmal atrial fibrillation and flutter.  She was admitted with a stroke.    Atrial fibrillation with rapid ventricular rates.  Also with history of nocturnal high degree AV block on monitor 10/2020, so only taking metoprolol dose in AM.   -Currently appears in sinus tachycardia with very frequent PACs/PVCs, rates 100-130s -Maintain K>4, Mag>2 -Increase metoprolol to 50 mg once daily.  -Continue Eliquis 5 mg by mouth twice daily for secondary prophylaxis of stroke.  Dose was previously 2.5 mg BID, increased after CVA.    For questions or updates, please contact [place] Please consult [email or web address] for contact info under Cardiology/STEMI.      Signed, Lars Mage, MD  01/31/2021, 10:40 AM

## 2021-01-31 NOTE — NC FL2 (Signed)
[place] LEVEL OF CARE SCREENING TOOL     IDENTIFICATION  Patient Name: Carol [name] Birthdate: February 13, 1928 Sex: female Admission Date (Current Location): 01/27/2021  [facility] [place] Number:  Herbalist and Address:  The . [facility], [place] [address] [place], [place] [address]      Provider Number: [id number]  Attending Physician Name and Address:  Shelly Coss, MD  Relative Name and Phone Number:  Allena Napoleon [phone]    Current Level of Care: Hospital Recommended Level of Care: [place] Prior Approval Number:    Date Approved/Denied:   PASRR Number: [id number], Expires 03/01/21  Discharge Plan: SNF    Current Diagnoses: Patient Active Problem List   Diagnosis Date Noted  . Acute CVA (cerebrovascular accident) ([place]) 01/27/2021  . Right leg weakness 01/27/2021  . Allergic rhinitis due to pollen 01/18/2021  . Anxiety disorder 01/18/2021  . Complication of surgical procedure 01/18/2021  . Constipation 01/18/2021  . Hypercoagulable state ([place]) 01/18/2021  . Memory problem 01/18/2021  . Overactive bladder 01/18/2021  . Polyp of colon 01/18/2021  . Sciatica 01/18/2021  . Senile purpura ([place]) 01/18/2021  . Atherosclerotic heart disease of native coronary artery without angina pectoris 01/18/2021  . Mixed hyperlipidemia 01/18/2021  . Essential hypertension 01/18/2021  . AV heart block 11/23/2020  . Coronary artery disease   . Dyspnea   . Hypertension   . Lumbar disc disease   . Lung nodules   . Obesity   . Spinal stenosis   . Paroxysmal atrial fibrillation ([place]) 01/30/2019  . Sepsis ([place]) 08/17/2018  . Autoimmune hemolytic anemia (HCC)   . Acute blood loss anemia 05/14/2018  . Elevated bilirubin 05/14/2018  . Obstructive jaundice 05/14/2018  . 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 Long term (current) use of anticoagulants 07/14/2013  . Chronic diastolic heart  failure ([place]) 01/17/2012  . Hyponatremia 01/17/2012  . Atrial fibrillation ([place])   . 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 CAD (coronary artery disease) 12/26/2011  . History of Villous adenoma of rectum   . Hypertensive heart disease without CHF   . Hyperlipidemia   . Hypothyroidism   . Lumbar degenerative disc disease   . Aortic stenosis 11/08/2011  . Personal history of solitary pulmonary nodule 11/07/2011    Orientation RESPIRATION BLADDER Height & Weight     Self  Normal Incontinent,External catheter Weight: 141 lb 12.1 oz (64.3 kg) Height:  5\' 2"  (157.5 cm)  BEHAVIORAL SYMPTOMS/MOOD NEUROLOGICAL BOWEL NUTRITION STATUS      Incontinent Diet (See DC summary)  AMBULATORY STATUS COMMUNICATION OF NEEDS Skin   Extensive Assist Verbally Skin abrasions (Ecchymosis bilateral arm, leg and buttock. Skin tear bilateral, pretibial, anterior, with foam dressing.)                       Personal Care Assistance Level of Assistance  Bathing,Feeding,Dressing Bathing Assistance: Maximum assistance Feeding assistance: Limited assistance Dressing Assistance: Maximum assistance     Functional Limitations Info  Sight,Hearing,Speech Sight Info: Adequate Hearing Info: Impaired Speech Info: Adequate    SPECIAL CARE FACTORS FREQUENCY  PT (By licensed PT),OT (By licensed OT)     PT Frequency: 5x week OT Frequency: 5x week            Contractures Contractures Info: Not present    Additional Factors Info  Code Status,Allergies,Psychotropic Code Status Info: DNR Allergies Info: Ibuprofen, Tapentadol, Ace Inhibitors, Angiotensin Receptor Blockers,  Dicloxacillin, Doxycycline, Hydrocodone, Nsaids, Adhesive tape, Latex Psychotropic Info: Aprozolam (Xanax)         Current Medications (01/31/2021):  This is the current hospital active medication list Current Facility-Administered Medications  Medication Dose Route Frequency Provider Last Rate Last Admin  . acetaminophen (TYLENOL) tablet  650 mg  650 mg Oral Q4H PRN Para Skeans, MD   650 mg at 01/29/21 2143   Or  . acetaminophen (TYLENOL) 160 MG/5ML solution 650 mg  650 mg Per Tube Q4H PRN Para Skeans, MD       Or  . acetaminophen (TYLENOL) suppository 650 mg  650 mg Rectal Q4H PRN Para Skeans, MD      . ALPRAZolam Duanne Moron) tablet 0.25 mg  0.25 mg Oral QHS Florina Ou V, MD   0.25 mg at 01/30/21 2239  . apixaban (ELIQUIS) tablet 5 mg  5 mg Oral BID Joselyn Glassman A, RPH   5 mg at 01/30/21 2239  . atorvastatin (LIPITOR) tablet 20 mg  20 mg Oral QHS Para Skeans, MD   20 mg at 01/30/21 2239  . bisacodyl (DULCOLAX) suppository 10 mg  10 mg Rectal Once Shelly Coss, MD      . cephALEXin (KEFLEX) capsule 500 mg  500 mg Oral Q12H Shelly Coss, MD   500 mg at 01/30/21 2241  . gabapentin (NEURONTIN) capsule 100 mg  100 mg Oral TID Para Skeans, MD   100 mg at 01/30/21 2239  . levothyroxine (SYNTHROID) tablet 88 mcg  88 mcg Oral QAC breakfast Para Skeans, MD   88 mcg at 01/31/21 0608  . loratadine (CLARITIN) tablet 10 mg  10 mg Oral Daily Para Skeans, MD   10 mg at 01/30/21 1053  . metoprolol tartrate (LOPRESSOR) injection 2.5 mg  2.5 mg Intravenous Q4H PRN Para Skeans, MD   2.5 mg at 01/27/21 1722  . metoprolol tartrate (LOPRESSOR) tablet 25 mg  25 mg Oral Daily Bernerd Pho M, PA-C   25 mg at 01/30/21 1053  . nystatin (MYCOSTATIN/NYSTOP) topical powder   Topical BID Florencia Reasons, MD   Given at 01/30/21 2241  . oxybutynin (DITROPAN) tablet 5 mg  5 mg Oral Baron Sane, MD   5 mg at 01/30/21 1053  . polyethylene glycol (MIRALAX / GLYCOLAX) packet 17 g  17 g Oral Daily Adhikari, Amrit, MD      . predniSONE (DELTASONE) tablet 5 mg  5 mg Oral Q breakfast Para Skeans, MD   5 mg at 01/31/21 0801  . senna (SENOKOT) tablet 17.2 mg  2 tablet Oral Daily Vickie Epley, MD   17.2 mg at 01/29/21 1229     Discharge Medications: Please see discharge summary for a list of discharge medications.  Relevant Imaging  Results:  Relevant Lab Results:   Additional Information SS# [id number], Covid vaccinated  Geralynn Ochs, [place]

## 2021-01-31 NOTE — Plan of Care (Signed)
Fragile skin, easily to get a skin tear. New one to RUA. Problem: Education: Goal: Knowledge of General Education information will improve Description: Including pain rating scale, medication(s)/side effects and non-pharmacologic comfort measures Outcome: Progressing   Problem: Health Behavior/Discharge Planning: Goal: Ability to manage health-related needs will improve Outcome: Progressing   Problem: Clinical Measurements: Goal: Ability to maintain clinical measurements within normal limits will improve Outcome: Progressing Goal: Will remain free from infection Outcome: Progressing Goal: Diagnostic test results will improve Outcome: Progressing Goal: Respiratory complications will improve Outcome: Progressing Goal: Cardiovascular complication will be avoided Outcome: Progressing   Problem: Activity: Goal: Risk for activity intolerance will decrease Outcome: Progressing   Problem: Nutrition: Goal: Adequate nutrition will be maintained Outcome: Progressing   Problem: Coping: Goal: Level of anxiety will decrease Outcome: Progressing   Problem: Elimination: Goal: Will not experience complications related to bowel motility Outcome: Progressing Goal: Will not experience complications related to urinary retention Outcome: Progressing   Problem: Pain Managment: Goal: General experience of comfort will improve Outcome: Progressing   Problem: Safety: Goal: Ability to remain free from injury will improve Outcome: Progressing   Problem: Skin Integrity: Goal: Risk for impaired skin integrity will decrease Outcome: Progressing   Problem: Education: Goal: Knowledge of disease or condition will improve Outcome: Progressing Goal: Knowledge of secondary prevention will improve Outcome: Progressing Goal: Knowledge of patient specific risk factors addressed and post discharge goals established will improve Outcome: Progressing Goal: Individualized Educational  Video(s) Outcome: Progressing   Problem: Coping: Goal: Will verbalize positive feelings about self Outcome: Progressing Goal: Will identify appropriate support needs Outcome: Progressing   Problem: Health Behavior/Discharge Planning: Goal: Ability to manage health-related needs will improve Outcome: Progressing   Problem: Self-Care: Goal: Ability to participate in self-care as condition permits will improve Outcome: Progressing Goal: Verbalization of feelings and concerns over difficulty with self-care will improve Outcome: Progressing Goal: Ability to communicate needs accurately will improve Outcome: Progressing   Problem: Nutrition: Goal: Risk of aspiration will decrease Outcome: Progressing Goal: Dietary intake will improve Outcome: Progressing   Problem: Ischemic Stroke/TIA Tissue Perfusion: Goal: Complications of ischemic stroke/TIA will be minimized Outcome: Progressing

## 2021-02-01 ENCOUNTER — Inpatient Hospital Stay (HOSPITAL_COMMUNITY): Payer: Medicare Other

## 2021-02-01 DIAGNOSIS — I639 Cerebral infarction, unspecified: Secondary | ICD-10-CM | POA: Diagnosis not present

## 2021-02-01 DIAGNOSIS — I493 Ventricular premature depolarization: Secondary | ICD-10-CM | POA: Diagnosis not present

## 2021-02-01 DIAGNOSIS — I471 Supraventricular tachycardia: Secondary | ICD-10-CM | POA: Diagnosis not present

## 2021-02-01 DIAGNOSIS — I4891 Unspecified atrial fibrillation: Secondary | ICD-10-CM | POA: Diagnosis not present

## 2021-02-01 DIAGNOSIS — I491 Atrial premature depolarization: Secondary | ICD-10-CM | POA: Diagnosis not present

## 2021-02-01 LAB — CBC WITH DIFFERENTIAL/PLATELET
Abs Immature Granulocytes: 0.04 10*3/uL (ref 0.00–0.07)
Basophils Absolute: 0 10*3/uL (ref 0.0–0.1)
Basophils Relative: 0 %
Eosinophils Absolute: 0 10*3/uL (ref 0.0–0.5)
Eosinophils Relative: 0 %
HCT: 37.1 % (ref 36.0–46.0)
Hemoglobin: 12 g/dL (ref 12.0–15.0)
Immature Granulocytes: 0 %
Lymphocytes Relative: 10 %
Lymphs Abs: 1.1 10*3/uL (ref 0.7–4.0)
MCH: 28.2 pg (ref 26.0–34.0)
MCHC: 32.3 g/dL (ref 30.0–36.0)
MCV: 87.1 fL (ref 80.0–100.0)
Monocytes Absolute: 1.6 10*3/uL — ABNORMAL HIGH (ref 0.1–1.0)
Monocytes Relative: 14 %
Neutro Abs: 8.6 10*3/uL — ABNORMAL HIGH (ref 1.7–7.7)
Neutrophils Relative %: 76 %
Platelets: 252 10*3/uL (ref 150–400)
RBC: 4.26 MIL/uL (ref 3.87–5.11)
RDW: 17 % — ABNORMAL HIGH (ref 11.5–15.5)
WBC: 11.3 10*3/uL — ABNORMAL HIGH (ref 4.0–10.5)
nRBC: 0 % (ref 0.0–0.2)

## 2021-02-01 LAB — BASIC METABOLIC PANEL
Anion gap: 11 (ref 5–15)
BUN: 29 mg/dL — ABNORMAL HIGH (ref 8–23)
CO2: 22 mmol/L (ref 22–32)
Calcium: 9.2 mg/dL (ref 8.9–10.3)
Chloride: 99 mmol/L (ref 98–111)
Creatinine, Ser: 1.03 mg/dL — ABNORMAL HIGH (ref 0.44–1.00)
GFR, Estimated: 51 mL/min — ABNORMAL LOW (ref >60)
Glucose, Bld: 136 mg/dL — ABNORMAL HIGH (ref 70–99)
Potassium: 4.1 mmol/L (ref 3.5–5.1)
Sodium: 132 mmol/L — ABNORMAL LOW (ref 135–145)

## 2021-02-01 LAB — AMMONIA: Ammonia: 17 umol/L (ref 9–35)

## 2021-02-01 MED ORDER — ACETAMINOPHEN 650 MG RE SUPP: 650.0000 mg | RECTAL | Status: DC | PRN

## 2021-02-01 MED ORDER — ACETAMINOPHEN 325 MG PO TABS
650.0000 mg | ORAL_TABLET | ORAL | Status: DC | PRN
  Administered 2021-02-03: 650 mg via ORAL
  Filled 2021-02-01: qty 2

## 2021-02-01 MED ORDER — POLYETHYLENE GLYCOL 3350 17 G PO PACK
17.0000 g | PACK | Freq: Two times a day (BID) | ORAL | Status: DC
  Administered 2021-02-01 – 2021-02-05 (×9): 17 g via ORAL
  Filled 2021-02-01 (×9): qty 1

## 2021-02-01 MED ORDER — METOPROLOL TARTRATE 50 MG PO TABS
50.0000 mg | ORAL_TABLET | Freq: Two times a day (BID) | ORAL | Status: DC
  Administered 2021-02-01 (×2): 50 mg via ORAL
  Filled 2021-02-01 (×2): qty 1

## 2021-02-01 MED ORDER — ACETAMINOPHEN 160 MG/5ML PO SOLN
650.0000 mg | ORAL | Status: DC | PRN
  Administered 2021-02-01 – 2021-02-04 (×5): 650 mg via ORAL
  Filled 2021-02-01 (×5): qty 20.3

## 2021-02-01 NOTE — Progress Notes (Signed)
MD notified about multiple skin tears, WOC consult and worsening abd distention, awaiting new orders

## 2021-02-01 NOTE — Progress Notes (Signed)
   Reviewed case with Dr. Gardiner Rhyme and Dr. Quentin Ore  Pt continues to have AF with RVR 110-130s.  Will continue to increase her BB, her nocturnal pauses in the past not-withstanding.  No bradycardia noted thus far on tele.     Increase lopressor to 50 mg BID.   If she were to have symptomatic bradycardia or other AVB necessitating PPM consideration, that could be done this admission.   EP will follow along.   Legrand Como [address]" [place], PA-C  02/01/2021 9:48 AM

## 2021-02-01 NOTE — Progress Notes (Signed)
PROGRESS NOTE    RAVINA MILNER  HWE:[id number] DOB: 04-26-1928 DOA: 01/27/2021 PCP: Lujean Amel, MD   Chief Complain: Right-sided weakness, slurred speech  Brief Narrative: Patient is a 87 year old female who presented to the emergency department with complaints of right leg weakness/slurred speech.  Work-up on presentation showed acute CVA.  Hospital course also remarkable for UTI, A. fib with RVR.  Cardiology and neurology were following.  Plan for skilled nursing facility placement when hemodynamically stable.  Hospitalist remarkable for sinus tachycardia with PVCs/Afib with RVR.  Assessment & Plan:   Principal Problem:   Acute CVA (cerebrovascular accident) [facility]) Active Problems:   Hyperlipidemia   Hypothyroidism   Chronic diastolic heart failure (HCC)   Atrial fibrillation (HCC)   AV heart block   Right leg weakness   Acute nonhemorrhagic CVA: Presented with slurred speech, right leg weakness.  MRI showed acute infarcts within the left cerebral hemisphere.  Stroke work-up initiated.  Neurology was following.  Stroke suspected to be embolic in etiology likely from A. fib with RVR and she was on subtherapeutic dose. Stroke work-up has been completed.  Eliquis dose has been increased to 5 mg twice daily.  Continue Lipitor.  Echo showed ejection fraction of 60-65%, no intracardiac source of emboli.  Hemoglobin A1c of 4.5.  TSH is normal.  LDL 48 PT/OT recommending skilled nursing facility on discharge. She should follow-up with [facility] neurology in 4 weeks.  Acute metabolic encephalopathy: At baseline, she is alert and oriented.  She is from independent living facility.  Walks with help of walker/cane.  Acute encephalopathy is thought to be secondary to UTI/acute CVA.  Continue monitoring mental status.  Delirium precautions.  Mental status has improved. She is alert and awake  A. fib with RVR/sinus tachycardia with PVCs: Cardiology following.  Currently on metoprolol.  She  was evaluated by EP sometime back.  Continue monitoring on telemetry.  She has been tachycardic recently with heart rate in the range of 110-120.  EKG on 01/31/21 showed sinus tachycardia with PVCs/Pacs. EP re-consulted.  History of coronary artery disease: Status post CABG in 2013, has history of severe aortic stenosis status post tissue AVR in 2013.  Follows with cardiology.  Chronic diastolic congestive heart failure: Currently appears euvolemic.  She was also orthostatic and was started on gentle IV fluids, which has been stopped now.  UTI: Urine culture showed Klebsiella pneumoniae, continue antibiotics for 5 days course.  Orthostatic hypotension: Started on gentle IV fluids,now stopped.  Cortisol unremarkable.  Continue to monitor  Hyponatremia: Could be from dehydration/poor oral intake and was given IV fluids.  Sodium levels stable in the range of 130s.  Continue to monitor  History of hemolytic anemia:On chronic  prednisone therapy.  Hypothyroidism: Synthyroid  Constipation: Continue aggressive bowel regimen.  Debility/deconditioning: PT/OT recommended  Skilled nursing facility on discharge.             DVT prophylaxis:Eliquis Code Status: DNR Family Communication: daughter on phone on 01/31/21 Status is: Inpatient  Remains inpatient appropriate because:Inpatient level of care appropriate due to severity of illness   Dispo: The patient is from: Home              Anticipated d/c is to: SNF              Anticipated d/c date is: 2-3 days              Patient currently is not medically stable to d/c.   Difficult to place patient  No      Consultants: Cardiology, neurology  Procedures: None  Antimicrobials:  Anti-infectives (From admission, onward)   Start     Dose/Rate Route Frequency Ordered Stop   01/30/21 1100  cephALEXin (KEFLEX) capsule 500 mg        500 mg Oral Every 12 hours 01/30/21 1045 02/04/21 0959   01/27/21 1045  cephALEXin (KEFLEX) capsule 500 mg         500 mg Oral Once 01/27/21 1030 01/27/21 1216      Subjective:  Patient seen and examined the bedside this morning.  She looks little weak, confused today.  Had a bowel movement yesterday.  Denies shortness of breath or chest pain.  Continues to be tachycardic.   Objective: Vitals:   01/31/21 1137 01/31/21 2057 01/31/21 2348 02/01/21 0351  BP: 133/83 119/81 (!) 152/85 133/70  Pulse: (!) 116 (!) 109 (!) 105 (!) 109  Resp: 18 17 17 17   Temp: (!) 97.4 F (36.3 C) 98.8 F (37.1 C) 99.6 F (37.6 C) 100.1 F (37.8 C)  TempSrc: Oral Oral Oral Oral  SpO2: 96% 95% 95% 93%  Weight:      Height:        Intake/Output Summary (Last 24 hours) at 02/01/2021 0749 Last data filed at 01/31/2021 1859 Gross per 24 hour  Intake 354 ml  Output -  Net 354 ml   Filed Weights   01/27/21 1810  Weight: 64.3 kg    Examination:   General exam: Very deconditioned, debilitated elderly female Respiratory system:  no wheezes or crackles  Cardiovascular system: Afib with RVR. No JVD, murmurs, rubs, gallops or clicks. Gastrointestinal system: Abdomen is nondistended, soft and nontender. No organomegaly or masses felt. Normal bowel sounds heard. Central nervous system: Alert and oriented.  Mild right sided weakness Extremities: No edema, no clubbing ,no cyanosis Skin: Multiple scattered purpura     Data Reviewed: I have personally reviewed following labs and imaging studies  CBC: Recent Labs  Lab 01/27/21 0643 01/27/21 0647 02/01/21 0337  WBC 8.5  --  11.3*  NEUTROABS 5.8  --  8.6*  HGB 12.4 13.3 12.0  HCT 38.6 39.0 37.1  MCV 90.2  --  87.1  PLT 204  --  341   Basic Metabolic Panel: Recent Labs  Lab 01/27/21 0643 01/27/21 0647 01/29/21 0202 01/30/21 0253 01/31/21 0140 01/31/21 1214 02/01/21 0337  NA 132* 134* 131* 130* 131*  --  132*  K 4.0 4.0 3.8 3.7 4.0  --  4.1  CL 97* 97* 98 98 99  --  99  CO2 26  --  25 23 22   --  22  GLUCOSE 115* 106* 108* 103* 111*  --  136*   BUN 18 21 20 23  24*  --  29*  CREATININE 1.04* 1.00 1.00 1.02* 0.89  --  1.03*  CALCIUM 9.3  --  8.8* 8.0* 8.5*  --  9.2  MG  --   --  2.1  --   --  2.0  --    GFR: Estimated Creatinine Clearance: 31.3 mL/min (A) (by C-G formula based on SCr of 1.03 mg/dL (H)). Liver Function Tests: Recent Labs  Lab 01/27/21 0643  AST 22  ALT 17  ALKPHOS 54  BILITOT 1.0  PROT 6.5  ALBUMIN 3.8   No results for input(s): LIPASE, AMYLASE in the last 168 hours. No results for input(s): AMMONIA in the last 168 hours. Coagulation Profile: Recent Labs  Lab 01/27/21 0643  INR  1.1   Cardiac Enzymes: No results for input(s): CKTOTAL, CKMB, CKMBINDEX, TROPONINI in the last 168 hours. BNP (last 3 results) No results for input(s): PROBNP in the last 8760 hours. HbA1C: No results for input(s): HGBA1C in the last 72 hours. CBG: Recent Labs  Lab 01/27/21 0647  GLUCAP 108*   Lipid Profile: No results for input(s): CHOL, HDL, LDLCALC, TRIG, CHOLHDL, LDLDIRECT in the last 72 hours. Thyroid Function Tests: No results for input(s): TSH, T4TOTAL, FREET4, T3FREE, THYROIDAB in the last 72 hours. Anemia Panel: No results for input(s): VITAMINB12, FOLATE, FERRITIN, TIBC, IRON, RETICCTPCT in the last 72 hours. Sepsis Labs: No results for input(s): PROCALCITON, LATICACIDVEN in the last 168 hours.  Recent Results (from the past 240 hour(s))  Urine culture     Status: Abnormal   Collection Time: 01/27/21  5:32 PM   Specimen: Urine, Random  Result Value Ref Range Status   Specimen Description URINE, RANDOM  Final   Special Requests   Final    NONE Performed at [facility], [address], [place], [place] [address]    Culture >=100,000 COLONIES/mL KLEBSIELLA PNEUMONIAE (A)  Final   Report Status 01/29/2021 FINAL  Final   Organism ID, Bacteria KLEBSIELLA PNEUMONIAE (A)  Final      Susceptibility   Klebsiella pneumoniae - MIC*    AMPICILLIN >=32 RESISTANT Resistant     CEFAZOLIN <=4 SENSITIVE  Sensitive     CEFEPIME <=0.12 SENSITIVE Sensitive     CEFTRIAXONE <=0.25 SENSITIVE Sensitive     CIPROFLOXACIN <=0.25 SENSITIVE Sensitive     GENTAMICIN <=1 SENSITIVE Sensitive     IMIPENEM <=0.25 SENSITIVE Sensitive     NITROFURANTOIN 32 SENSITIVE Sensitive     TRIMETH/SULFA >=320 RESISTANT Resistant     AMPICILLIN/SULBACTAM >=32 RESISTANT Resistant     PIP/TAZO <=4 SENSITIVE Sensitive     * >=100,000 COLONIES/mL KLEBSIELLA PNEUMONIAE  SARS CORONAVIRUS 2 (TAT 6-24 HRS) Nasopharyngeal Nasopharyngeal Swab     Status: None   Collection Time: 01/28/21  9:58 AM   Specimen: Nasopharyngeal Swab  Result Value Ref Range Status   SARS Coronavirus 2 NEGATIVE NEGATIVE Final    Comment: (NOTE) SARS-CoV-2 target nucleic acids are NOT DETECTED.  The SARS-CoV-2 RNA is generally detectable in upper and lower respiratory specimens during the acute phase of infection. Negative results do not preclude SARS-CoV-2 infection, do not rule out co-infections with other pathogens, and should not be used as the sole basis for treatment or other patient management decisions. Negative results must be combined with clinical observations, patient history, and epidemiological information. The expected result is Negative.  Fact Sheet for Patients: SugarRoll.be  Fact Sheet for Healthcare Providers: https://www.woods-mathews.com/  This test is not yet approved or cleared by the [place] FDA and  has been authorized for detection and/or diagnosis of SARS-CoV-2 by FDA under an Emergency Use Authorization (EUA). This EUA will remain  in effect (meaning this test can be used) for the duration of the COVID-19 declaration under Se ction 564(b)(1) of the Act, 21 U.S.C. section 360bbb-3(b)(1), unless the authorization is terminated or revoked sooner.  Performed at [facility], [place] [address], [place], [place] [address]   SARS Coronavirus 2 by RT PCR (hospital  order, performed in [facility] hospital lab) Nasopharyngeal Nasopharyngeal Swab     Status: None   Collection Time: 01/30/21 11:10 AM   Specimen: Nasopharyngeal Swab  Result Value Ref Range Status   SARS Coronavirus 2 NEGATIVE NEGATIVE Final  Comment: (NOTE) SARS-CoV-2 target nucleic acids are NOT DETECTED.  The SARS-CoV-2 RNA is generally detectable in upper and lower respiratory specimens during the acute phase of infection. The lowest concentration of SARS-CoV-2 viral copies this assay can detect is 250 copies / mL. A negative result does not preclude SARS-CoV-2 infection and should not be used as the sole basis for treatment or other patient management decisions.  A negative result may occur with improper specimen collection / handling, submission of specimen other than nasopharyngeal swab, presence of viral mutation(s) within the areas targeted by this assay, and inadequate number of viral copies (<250 copies / mL). A negative result must be combined with clinical observations, patient history, and epidemiological information.  Fact Sheet for Patients:   StrictlyIdeas.no  Fact Sheet for Healthcare Providers: BankingDealers.co.za  This test is not yet approved or  cleared by the [place] FDA and has been authorized for detection and/or diagnosis of SARS-CoV-2 by FDA under an Emergency Use Authorization (EUA).  This EUA will remain in effect (meaning this test can be used) for the duration of the COVID-19 declaration under Section 564(b)(1) of the Act, 21 U.S.C. section 360bbb-3(b)(1), unless the authorization is terminated or revoked sooner.  Performed at [facility], [place] [address] [place], [place] [address]          Radiology Studies: No results found.      Scheduled Meds: . ALPRAZolam  0.25 mg Oral QHS  . apixaban  5 mg Oral BID  . atorvastatin  20 mg Oral QHS  . cephALEXin  500 mg Oral Q12H  .  gabapentin  100 mg Oral TID  . levothyroxine  88 mcg Oral QAC breakfast  . loratadine  10 mg Oral Daily  . metoprolol tartrate  50 mg Oral Daily  . nystatin   Topical BID  . oxybutynin  5 mg Oral QODAY  . polyethylene glycol  17 g Oral Daily  . predniSONE  5 mg Oral Q breakfast  . senna  2 tablet Oral Daily   Continuous Infusions:    LOS: 5 days    Time spent: 35 mins.More than 50% of that time was spent in counseling and/or coordination of care.      Shelly Coss, MD [facility] P2/08/2021, 7:49 AM

## 2021-02-01 NOTE — Progress Notes (Signed)
Progress Note  Patient Name: Carol [name] Date of Encounter: 02/01/2021  Primary Cardiologist: Jenean Lindau, MD   Subjective   Denies any dyspnea, chest pain, or palpitations  Inpatient Medications    Scheduled Meds: . ALPRAZolam  0.25 mg Oral QHS  . apixaban  5 mg Oral BID  . atorvastatin  20 mg Oral QHS  . cephALEXin  500 mg Oral Q12H  . gabapentin  100 mg Oral TID  . levothyroxine  88 mcg Oral QAC breakfast  . loratadine  10 mg Oral Daily  . metoprolol tartrate  50 mg Oral Daily  . nystatin   Topical BID  . oxybutynin  5 mg Oral QODAY  . polyethylene glycol  17 g Oral Daily  . predniSONE  5 mg Oral Q breakfast  . senna  2 tablet Oral Daily   Continuous Infusions:  PRN Meds: acetaminophen **OR** acetaminophen (TYLENOL) oral liquid 160 mg/5 mL **OR** acetaminophen, metoprolol tartrate   Vital Signs    Vitals:   01/31/21 2057 01/31/21 2348 02/01/21 0351 02/01/21 0758  BP: 119/81 (!) 152/85 133/70 128/64  Pulse: (!) 109 (!) 105 (!) 109 (!) 101  Resp: 17 17 17 18   Temp: 98.8 F (37.1 C) 99.6 F (37.6 C) 100.1 F (37.8 C) 98 F (36.7 C)  TempSrc: Oral Oral Oral Oral  SpO2: 95% 95% 93%   Weight:      Height:        Intake/Output Summary (Last 24 hours) at 02/01/2021 0858 Last data filed at 01/31/2021 1859 Gross per 24 hour  Intake 354 ml  Output --  Net 354 ml   Filed Weights   01/27/21 1810  Weight: 64.3 kg    Telemetry    Sinus tachycardia with PACs/PVCs with rate 120s followed by frequent episodes of SVT up to 140s - Personally Reviewed  ECG    ST with PACs/PVCs- Personally Reviewed  Physical Exam   GEN: No acute distress.   Neck: No JVD Cardiac: tachycardic, irregular, no murmurs Respiratory: Clear to auscultation bilaterally. GI: Soft MS: No edema; Ecchymosis Neuro:  slurred speech, weakness.  Psych: Normal affect   Labs    Chemistry Recent Labs  Lab 01/27/21 [phone] 01/27/21 0647 01/30/21 0253 01/31/21 0140  02/01/21 0337  NA 132*   < > 130* 131* 132*  K 4.0   < > 3.7 4.0 4.1  CL 97*   < > 98 99 99  CO2 26   < > 23 22 22   GLUCOSE 115*   < > 103* 111* 136*  BUN 18   < > 23 24* 29*  CREATININE 1.04*   < > 1.02* 0.89 1.03*  CALCIUM 9.3   < > 8.0* 8.5* 9.2  PROT 6.5  --   --   --   --   ALBUMIN 3.8  --   --   --   --   AST 22  --   --   --   --   ALT 17  --   --   --   --   ALKPHOS 54  --   --   --   --   BILITOT 1.0  --   --   --   --   GFRNONAA 51*   < > 52* >60 51*  ANIONGAP 9   < > 9 10 11    < > = values in this interval not displayed.     Hematology Recent Labs  Lab  01/27/21 0643 01/27/21 0647 02/01/21 0337  WBC 8.5  --  11.3*  RBC 4.28  --  4.26  HGB 12.4 13.3 12.0  HCT 38.6 39.0 37.1  MCV 90.2  --  87.1  MCH 29.0  --  28.2  MCHC 32.1  --  32.3  RDW 18.2*  --  17.0*  PLT 204  --  252    Cardiac EnzymesNo results for input(s): TROPONINI in the last 168 hours. No results for input(s): TROPIPOC in the last 168 hours.   BNPNo results for input(s): BNP, PROBNP in the last 168 hours.   DDimer No results for input(s): DDIMER in the last 168 hours.   Radiology    No results found.  Cardiac Studies   No new   Assessment & Plan    Ms. Zito is a 87 year old woman with a history of coronary artery disease post CABG and paroxysmal atrial fibrillation and flutter.  She was admitted with a stroke.    SVT/Atrial fibrillation with rapid ventricular rates.  Also with history of nocturnal high degree AV block on monitor 10/2020, so has been only taking metoprolol dose in AM.   -Currently appears in sinus tachycardia with frequent PACs/PVCs, rates 120s, alternating with frequent episodes of SVT in 140s -Maintain K>4, Mag>2 -Continue metoprolol 50 mg once daily.  -Continue Eliquis 5 mg by mouth twice daily for secondary prophylaxis of stroke.  Dose was previously 2.5 mg BID, increased after CVA. -Given her tachybrady syndrome with history of complete heart block but now  with frequent SVT, will discuss with EP, as may need PPM so we can be more aggressive with beta-blocker.      For questions or updates, please contact [place] Please consult [email or web address] for contact info under Cardiology/STEMI.      Signed, Lars Mage, MD  02/01/2021, 8:58 AM

## 2021-02-01 NOTE — Progress Notes (Signed)
SLP Cancellation Note  Patient Details Name: DANDRA SHAMBAUGH MRN: [id number] DOB: 1928-01-07   Cancelled treatment:       Reason Eval/Treat Not Completed: Patient at procedure or test/unavailable (Pt currently working with PT. SLP will follow up and re-attempt later as schedule allows.)  Berit Raczkowski I. Hardin Negus, [place], [place] Office number [phone] Pager [place] 02/01/2021, 2:59 PM

## 2021-02-01 NOTE — Progress Notes (Signed)
MD notified that family wanted to speak to them about patient's lethargy today.

## 2021-02-01 NOTE — Progress Notes (Signed)
Physical Therapy Treatment Patient Details Name: Carol [name] MRN: [id number] DOB: 06-19-1928 Today's Date: 02/01/2021    History of Present Illness Pt is a 87 y.o. female who presents with R leg weakness and slurred speech. 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 PMH: acute blood loss anemia, autoimmune hemoyltic anemia, a-fib on anticoagulation, heart disease, chronic diastolic heart failure, spinal stenosis, sciatica, s/p AVR and CABG, dyspnea, CAD, aortic stenosis, HTN, and hypothyroidism.    PT Comments    Pt lethargic this date with daughter noting a decline in pt's overall status today compared to yesterday. Pt's daughter reports pt just returned from getting another CT scan. Pt displays incontinence of bowels and bladder with exertion and poor skin integrity with multiple skin tears present on legs and redness from irritation at buttocks, RN aware. Pt displays pushing syndrome as she tends to push herself over to the R with her L UE in sitting and even reports she feels like she is falling to the L. Focused session on decreased pushing syndrome and improving functional mobility independence, but pt displays decreased strength as she needed maxA to come to stand and pivot towards the R with bil knee block and modA majority of time to maintain her sitting balance EOB. Pt may benefit from visual cues provided from mirror for midline orientation. Will continue to follow acutely. Current recommendations remain appropriate.   Follow Up Recommendations  SNF;Supervision/Assistance - 24 hour     Equipment Recommendations  Rolling walker with 5" wheels;3in1 (PT);Wheelchair (measurements PT);Wheelchair cushion (measurements PT);Hospital bed    Recommendations for Other Services       Precautions / Restrictions Precautions Precautions: Fall Precaution Comments: Rt inattention; pushes to R; monitor HR;  orthostatic hypotension; HOH; impaired vision Restrictions Weight Bearing Restrictions: No    Mobility  Bed Mobility Overal bed mobility: Needs Assistance Bed Mobility: Supine to Sit     Supine to sit: HOB elevated;Mod assist     General bed mobility comments: Extra time and cues to initiate movement of legs off L EOB. Increased difficulty attending to and managing R side, needing AAROM at R leg and hand-over-hand at R UE. ModA to ascend trunk and to bring R leg off edge of bed.  Transfers Overall transfer level: Needs assistance Equipment used: Rolling walker (2 wheeled) Transfers: Sit to/from [facility] Sit to Stand: Max assist Stand pivot transfers: Max assist       General transfer comment: Attempted sit to stand from elevated EOB to RW but pt unable to extend hips and knees to obtain upright posture even with knee block, thus reattempted without RW and PT anterior to pt providing physical assistance under buttocks with bil knee block. Success with maxA to come to stand 2nd attempt and pivot towards the R to chair.  Ambulation/Gait             General Gait Details: Unable to this date   Stairs             Wheelchair Mobility    Modified Rankin (Stroke Patients Only) Modified Rankin (Stroke Patients Only) Pre-Morbid Rankin Score: Slight disability Modified Rankin: Severe disability     Balance Overall balance assessment: Needs assistance Sitting-balance support: Bilateral upper extremity supported;Feet supported Sitting balance-Leahy Scale: Poor Sitting balance - Comments: Bil UE support with pt pushing herself over to her R with her L UE, despite max cues to correct. Provided verbal and tactile cues  to find and maintain midline, poor carryover. Improved midline following leaning pt onto L elbow or L hand placement more lateral to pt's body.   Standing balance support: Bilateral upper extremity supported Standing balance-Leahy Scale:  Zero Standing balance comment: MaxA and bil knee block to stand for a brief period of time.                            Cognition Arousal/Alertness: Lethargic Behavior During Therapy: WFL for tasks assessed/performed Overall Cognitive Status: Impaired/Different from baseline Area of Impairment: Attention;Memory;Following commands;Safety/judgement;Problem solving;Awareness                   Current Attention Level: Sustained Memory: Decreased recall of precautions;Decreased short-term memory Following Commands: Follows one step commands consistently;Follows one step commands with increased time Safety/Judgement: Decreased awareness of safety;Decreased awareness of deficits Awareness: Intellectual Problem Solving: Slow processing;Decreased initiation;Difficulty sequencing;Requires verbal cues;Requires tactile cues General Comments: Requires repetition to follow commands; HOH. Right inattention. Poor body and safety awareness as she displays pushing syndrome with her L UE pushing towards her R. Requires multimodal cues to initiate and sequence tasks.      Exercises      General Comments General comments (skin integrity, edema, etc.): Urinary and bowel incontinence with exertion; skin tear noted at R inferior posterior thigh, RN made aware      Pertinent Vitals/Pain Pain Assessment: Faces Faces Pain Scale: Hurts even more Pain Location: legs and buttocks at areas of skin tears/rash Pain Descriptors / Indicators: Grimacing;Guarding;Moaning Pain Intervention(s): Limited activity within patient's tolerance;Monitored during session;Repositioned (notified RN of skin tear at R inferior posterior thigh)    Home Living                      Prior Function            PT Goals (current goals can now be found in the care plan section) Acute Rehab PT Goals Patient Stated Goal: to improve PT Goal Formulation: With patient/family Time For Goal Achievement:  02/11/21 Potential to Achieve Goals: Fair Progress towards PT goals: Not progressing toward goals - comment (pt lethargic today)    Frequency    Min 3X/week      PT Plan Current plan remains appropriate    Co-evaluation              AM-PAC PT "6 Clicks" Mobility   Outcome Measure  Help needed turning from your back to your side while in a flat bed without using bedrails?: A Lot Help needed moving from lying on your back to sitting on the side of a flat bed without using bedrails?: A Lot Help needed moving to and from a bed to a chair (including a wheelchair)?: A Lot Help needed standing up from a chair using your arms (e.g., wheelchair or bedside chair)?: A Lot Help needed to walk in hospital room?: Total Help needed climbing 3-5 steps with a railing? : Total 6 Click Score: 10    End of Session Equipment Utilized During Treatment: Gait belt Activity Tolerance: Patient limited by fatigue Patient left: in chair;with call bell/phone within reach;with chair alarm set;with family/visitor present Nurse Communication: Mobility status;Other (comment) (skin tear; incontinence) PT Visit Diagnosis: Unsteadiness on feet (R26.81);Other abnormalities of gait and mobility (R26.89);Muscle weakness (generalized) (M62.81);Ataxic gait (R26.0);Difficulty in walking, not elsewhere classified (R26.2);Other symptoms and signs involving the nervous system (R29.898);Dizziness and giddiness (R42);Hemiplegia and hemiparesis Hemiplegia - Right/Left: Right  Hemiplegia - dominant/non-dominant: Dominant Hemiplegia - caused by: Cerebral infarction     Time: 7672-0947 PT Time Calculation (min) (ACUTE ONLY): 45 min  Charges:  $Therapeutic Activity: 23-37 mins $Neuromuscular Re-education: 8-22 mins                     Moishe Spice, PT, DPT Acute Rehabilitation Services  Pager: [phone] Office: [phone]    Orvan Falconer 02/01/2021, 5:37 PM

## 2021-02-02 DIAGNOSIS — I471 Supraventricular tachycardia: Secondary | ICD-10-CM | POA: Diagnosis not present

## 2021-02-02 DIAGNOSIS — I4430 Unspecified atrioventricular block: Secondary | ICD-10-CM | POA: Diagnosis not present

## 2021-02-02 DIAGNOSIS — I639 Cerebral infarction, unspecified: Secondary | ICD-10-CM | POA: Diagnosis not present

## 2021-02-02 DIAGNOSIS — I4891 Unspecified atrial fibrillation: Secondary | ICD-10-CM | POA: Diagnosis not present

## 2021-02-02 LAB — CBC WITH DIFFERENTIAL/PLATELET
Abs Immature Granulocytes: 0.06 10*3/uL (ref 0.00–0.07)
Basophils Absolute: 0 10*3/uL (ref 0.0–0.1)
Basophils Relative: 0 %
Eosinophils Absolute: 0.1 10*3/uL (ref 0.0–0.5)
Eosinophils Relative: 1 %
HCT: 33.4 % — ABNORMAL LOW (ref 36.0–46.0)
Hemoglobin: 10.7 g/dL — ABNORMAL LOW (ref 12.0–15.0)
Immature Granulocytes: 0 %
Lymphocytes Relative: 10 %
Lymphs Abs: 1.4 10*3/uL (ref 0.7–4.0)
MCH: 28.3 pg (ref 26.0–34.0)
MCHC: 32 g/dL (ref 30.0–36.0)
MCV: 88.4 fL (ref 80.0–100.0)
Monocytes Absolute: 1.4 10*3/uL — ABNORMAL HIGH (ref 0.1–1.0)
Monocytes Relative: 10 %
Neutro Abs: 11 10*3/uL — ABNORMAL HIGH (ref 1.7–7.7)
Neutrophils Relative %: 79 %
Platelets: 234 10*3/uL (ref 150–400)
RBC: 3.78 MIL/uL — ABNORMAL LOW (ref 3.87–5.11)
RDW: 16.9 % — ABNORMAL HIGH (ref 11.5–15.5)
WBC: 14 10*3/uL — ABNORMAL HIGH (ref 4.0–10.5)
nRBC: 0 % (ref 0.0–0.2)

## 2021-02-02 LAB — BASIC METABOLIC PANEL
Anion gap: 10 (ref 5–15)
BUN: 41 mg/dL — ABNORMAL HIGH (ref 8–23)
CO2: 23 mmol/L (ref 22–32)
Calcium: 8.8 mg/dL — ABNORMAL LOW (ref 8.9–10.3)
Chloride: 100 mmol/L (ref 98–111)
Creatinine, Ser: 1.04 mg/dL — ABNORMAL HIGH (ref 0.44–1.00)
GFR, Estimated: 51 mL/min — ABNORMAL LOW (ref >60)
Glucose, Bld: 119 mg/dL — ABNORMAL HIGH (ref 70–99)
Potassium: 4.2 mmol/L (ref 3.5–5.1)
Sodium: 133 mmol/L — ABNORMAL LOW (ref 135–145)

## 2021-02-02 MED ORDER — METOPROLOL TARTRATE 50 MG PO TABS
100.0000 mg | ORAL_TABLET | Freq: Every day | ORAL | Status: DC
  Administered 2021-02-02 – 2021-02-06 (×5): 100 mg via ORAL
  Filled 2021-02-02 (×5): qty 2

## 2021-02-02 MED ORDER — METOPROLOL TARTRATE 50 MG PO TABS
50.0000 mg | ORAL_TABLET | Freq: Every day | ORAL | Status: DC
  Administered 2021-02-02 – 2021-02-05 (×4): 50 mg via ORAL
  Filled 2021-02-02 (×4): qty 1

## 2021-02-02 NOTE — Consult Note (Signed)
WOC Nurse Consult Note: Patient receiving care in [place]. Reason for Consult: "multiple ulcers" Wound type: Multiple skin tears to BLE.  Also BLE are heavily bruised from the knees to the feet. Pressure Injury POA: Yes/No/NA Measurement: na Wound bed: pink Drainage (amount, consistency, odor) scant on existing dressings Periwound: very fragile, thin skin Dressing procedure/placement/frequency:  For every skin tear on the LEs, place vaseline gauze over the areas, secure with a few turns of kerlex. Change daily.  If the dressing is stuck to the wound, moisten it with saline to ease removal and prevent additional skin tearing. Of note, patient is incontinent of urine and stool.  No wounds observed on buttocks. Monitor the wound area(s) for worsening of condition such as: Signs/symptoms of infection,  Increase in size,  Development of or worsening of odor, Development of pain, or increased pain at the affected locations.  Notify the medical team if any of these develop.  Thank you for the consult.  [facility] nurse will not follow at this time.  Please re-consult the [facility] team if needed.  Val Riles, RN, MSN, CWOCN, CNS-BC, pager [phone]

## 2021-02-02 NOTE — Telephone Encounter (Addendum)
Agree that pt is on the incorrect dose of Eliquis. She qualifies for 5mg  BID due to afib indication, SCr consistently < 1.5, and weight consistently > 60kg.  Pt is currently admitted for stroke. "Stroke suspected to be embolic in etiology likely from A. fib with RVR and she was on subtherapeutic dose. Stroke work-up has been completed.  Eliquis dose has been increased to 5 mg twice daily."  Subtherapeutic 2.5mg  dosing has been prescribed by Dr Geraldo Pitter since 01/30/19. Rx refilled 2x since then by MD's RN or CMA. Per office policy, anticoag refills should be routed to anticoag team to assess for appropriate dosing which was not done.    Will submit safety zone as incorrect Eliquis dosing was likely cause of patient's ischemic stroke per inpatient workup.

## 2021-02-02 NOTE — Progress Notes (Signed)
SLP Cancellation Note  Patient Details Name: Carol [name] MRN: [id number] DOB: 1928/07/13   Cancelled treatment:       Reason Eval/Treat Not Completed: Patient at procedure or test/unavailable (Pt currently being transferred from the chair back to bed with nursing staff. SLP will reattempt later as schedule allows.)  Sherron Mummert I. Hardin Negus, [place], [place] Office number [phone] Pager [place] 02/02/2021, 4:41 PM

## 2021-02-02 NOTE — Progress Notes (Signed)
Electrophysiology Rounding Note  Patient Name: Carol [name] Date of Encounter: 02/02/2021  Primary Cardiologist: Jenean Lindau, MD Electrophysiologist: Dr. Quentin Ore   Subjective   Pt is feeling OK this am. No specific complaints at the moment. Denies lightheadedness or dizziness. No tachypalpitations.   Inpatient Medications    Scheduled Meds: . ALPRAZolam  0.25 mg Oral QHS  . apixaban  5 mg Oral BID  . atorvastatin  20 mg Oral QHS  . cephALEXin  500 mg Oral Q12H  . levothyroxine  88 mcg Oral QAC breakfast  . metoprolol tartrate  50 mg Oral BID  . nystatin   Topical BID  . oxybutynin  5 mg Oral QODAY  . polyethylene glycol  17 g Oral BID  . predniSONE  5 mg Oral Q breakfast  . senna  2 tablet Oral Daily   Continuous Infusions:  PRN Meds: acetaminophen **OR** acetaminophen (TYLENOL) oral liquid 160 mg/5 mL **OR** acetaminophen, metoprolol tartrate   Vital Signs    Vitals:   02/01/21 2034 02/01/21 2136 02/01/21 2313 02/02/21 0337  BP: (!) 93/54 122/60 (!) 93/51 138/61  Pulse: 89 79 95 81  Resp: 17  17 17   Temp: 97.7 F (36.5 C)  98 F (36.7 C) 98.3 F (36.8 C)  TempSrc: Oral  Oral Oral  SpO2: 97%  97% 97%  Weight:      Height:        Intake/Output Summary (Last 24 hours) at 02/02/2021 0748 Last data filed at 02/01/2021 0945 Gross per 24 hour  Intake 0 ml  Output -  Net 0 ml   Filed Weights   01/27/21 1810  Weight: 64.3 kg    Physical Exam    GEN- The patient is elderly appearing, alert and oriented x 3 today.   Head- normocephalic, atraumatic Eyes-  Sclera clear, conjunctiva pink Ears- hearing intact Oropharynx- clear Neck- supple Lungs- Clear to ausculation bilaterally, normal work of breathing Heart- Irregularly irregular rate and rhythm, no murmurs, rubs or gallops GI- soft, NT, ND, + BS Extremities- no clubbing or cyanosis. No edema Skin- no rash or lesion Psych- euthymic mood, full affect Neuro- strength and sensation are  intact  Labs    CBC Recent Labs    02/01/21 0337  WBC 11.3*  NEUTROABS 8.6*  HGB 12.0  HCT 37.1  MCV 87.1  PLT 622   Basic Metabolic Panel Recent Labs    01/31/21 0140 01/31/21 1214 02/01/21 0337  NA 131*  --  132*  K 4.0  --  4.1  CL 99  --  99  CO2 22  --  22  GLUCOSE 111*  --  136*  BUN 24*  --  29*  CREATININE 0.89  --  1.03*  CALCIUM 8.5*  --  9.2  MG  --  2.0  --    Liver Function Tests No results for input(s): AST, ALT, ALKPHOS, BILITOT, PROT, ALBUMIN in the last 72 hours. No results for input(s): LIPASE, AMYLASE in the last 72 hours. Cardiac Enzymes No results for input(s): CKTOTAL, CKMB, CKMBINDEX, TROPONINI in the last 72 hours.   Telemetry    AF 90-100s at rest, 110-120s with activity, brief periods of sinus, occasional PVCs. Occasional missed beat but no sustained bradycardia. (personally reviewed)  Radiology    DG Abd 1 View  Result Date: 02/01/2021 CLINICAL DATA:  Abdominal pain and distension EXAM: ABDOMEN - 1 VIEW COMPARISON:  CT abdomen 05/14/2018 FINDINGS: Small bowel pattern is unremarkable. Large amount of  gas and stool throughout the colon. Peripheral gas pattern in the rectum presumed to be peripheral gas around impacted stool. The unlikely possibility pneumatosis is not excluded. Arterial vascular calcification is noted throughout. Clips in the right upper quadrant consistent with previous cholecystectomy. Scoliosis and degenerative changes of the spine. IMPRESSION: Large amount of gas and stool throughout the colon. Peripheral gas pattern in the rectum presumed to be peripheral gas around impacted stool. The unlikely possibility of pneumatosis is not excluded. If there is clinical concern regarding the possibility bowel infarction, consider CT of the abdomen pelvis. Otherwise, the findings could simply be due to constipation. Electronically Signed   By: Nelson Chimes M.D.   On: 02/01/2021 19:26   CT HEAD WO CONTRAST  Result Date:  02/01/2021 CLINICAL DATA:  Delirium. EXAM: CT HEAD WITHOUT CONTRAST TECHNIQUE: Contiguous axial images were obtained from the base of the skull through the vertex without intravenous contrast. COMPARISON:  Noncontrast head CT 01/27/2021. Brain MRI 01/27/2021. CT angiogram head/neck 01/28/2021. FINDINGS: Brain: Moderate cerebral and cerebellar atrophy. Known early subacute infarct within the left corona radiata/basal ganglia. Background moderate multifocal T2/FLAIR hyperintensity within the cerebral white matter is nonspecific compatible with chronic small vessel ischemic disease. Redemonstrated small early subacute infarct within the left cerebellar hemisphere. Superimposed small chronic infarcts within the bilateral cerebellar hemispheres. There is no acute intracranial hemorrhage. No extra-axial fluid collection. No evidence of intracranial mass. No midline shift. Vascular: No hyperdense vessel.  Atherosclerotic calcifications. Skull: Normal. Negative for fracture or focal lesion. Sinuses/Orbits: Visualized orbits show no acute finding. Trace ethmoid sinus mucosal thickening. IMPRESSION: No evidence of interval acute intracranial abnormality. Known subacute infarcts within the left corona radiata/basal ganglia and left cerebellar hemisphere. Stable background moderate generalized atrophy of the brain and chronic small vessel ischemic disease. Unchanged small remote infarcts within the bilateral cerebellar hemispheres. Electronically Signed   By: Kellie Simmering DO   On: 02/01/2021 14:59    Patient Profile     Ms. Cente is a 87 year old woman with a history of coronary artery disease post CABG and paroxysmal atrial fibrillation and flutter. She was admitted with a stroke and had issues with AF rates after lopressor held  Assessment & Plan    1. AF with RVR Rates improved with increased lopressor.  We will continue to challenge her with rate control Increase lopressor to 100 mg q am and 50 mg q pm.  She  has h/o nocturnal CHB, so follow closely for pacing indication(s) She is on Eliquis 5 mg BID for secondary prophylaxis of stroke with CHA2DS2VASC of at least 8.   LVEF 60-65% by Echo 01/29/21  2. Goals of Care Of note, Palliative care has been consulted, but has not yet seen.  Per PT notes pt displays "pushing syndrome" as she tends to lean over to the R side pushing with her LUE in sitting position, and reports like she feels like she is "falling" to the left.  This may complicate healing from pacemaker, and overall her recovery.  Ideally, rate control will be enough to manage her. We will continue to consider her PPM placement will align with her goals of care, as currently she is not very symptomatic with rates in the 90-100s at rest.   For questions or updates, please contact [place] Please consult [email or web address] for contact info under Cardiology/STEMI.  Signed, Shirley Friar, PA-C  02/02/2021, 7:48 AM

## 2021-02-02 NOTE — Progress Notes (Signed)
MD notified that patient still has not had a bowel movement, awaiting new orders

## 2021-02-02 NOTE — Progress Notes (Signed)
MD notified about trace blood in stool after disimpaction

## 2021-02-02 NOTE — Progress Notes (Signed)
Physical Therapy Treatment Patient Details Name: Carol [name] MRN: [id number] DOB: [birth year] Today's Date: 02/02/2021    History of Present Illness Pt is a 87 y.o. female who presents with R leg weakness and slurred speech. 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 PMH: acute blood loss anemia, autoimmune hemoyltic anemia, a-fib on anticoagulation, heart disease, chronic diastolic heart failure, spinal stenosis, sciatica, s/p AVR and CABG, dyspnea, CAD, aortic stenosis, HTN, and hypothyroidism.    PT Comments    Pt progressing well, progressing towards goals.  Granddaughter present and engaged and encouraging during the session.  Pt heavy mod assist for all mobility, transfers, pushes to the R.  Reported lightheadedness (which may have been more dizziness given the location of her stroke) VSS when checked, HR 112.  Pt fatigued quickly, but was able to use steady standing frame to get to chair with two person assist.  Remains appropriate for SNF level rehab at discharge.  PT will continue to follow acutely for safe mobility progression.  Follow Up Recommendations  SNF     Equipment Recommendations  Rolling walker with 5" wheels;3in1 (PT);Wheelchair (measurements PT);Wheelchair cushion (measurements PT);Hospital bed    Recommendations for Other Services       Precautions / Restrictions Precautions Precautions: Fall Precaution Comments: Rt inattention; pushes to R; monitor HR; orthostatic hypotension; HOH; impaired vision    Mobility  Bed Mobility Overal bed mobility: Needs Assistance Bed Mobility: Supine to Sit     Supine to sit: Mod assist;HOB elevated     General bed mobility comments: Mod assist to support trunk, multimodal cues and extra time to complete task.  Mod assist to shift weight to scoot.    Transfers Overall transfer level: Needs assistance   Transfers: Sit to/from Stand;Stand  Pivot Transfers Sit to Stand: Mod assist;+2 physical assistance Stand pivot transfers:  (on stedy, leaning heavily to the R)       General transfer comment: Mod assist progressing to heavy mod assist with repetition to stand from elevated bed to sara stedy standing frame.  Pt pushing to the right in standing and difficulty extending fully at the hips, fatigues quickly and needed to sit after initial stand in front of mirror due to fatigue before seated transfer on the stedy.  Pt needed progressively more assist to stand as she fatigued.  Ambulation/Gait                 Stairs             Wheelchair Mobility    Modified Rankin (Stroke Patients Only) Modified Rankin (Stroke Patients Only) Pre-Morbid Rankin Score: Moderate disability Modified Rankin: Severe disability     Balance Overall balance assessment: Needs assistance Sitting-balance support: Bilateral upper extremity supported;Single extremity supported;Feet supported Sitting balance-Leahy Scale: Poor Sitting balance - Comments: pushing to the R with left UE, mirror used for visual input and pt able to help correct with cues to look up and find the middle.  Sat EOB for ~15 mins working on posture, finding midline and inhibiting her R push. Postural control: Right lateral lean (push) Standing balance support: Bilateral upper extremity supported Standing balance-Leahy Scale: Poor Standing balance comment: mod to heavy mod assist in standing on sara stedy standing frame to block bil knees padded by pillow due to LE wounds and fragile skin.  Pt with significant R lateral push progressing quickly due to fatigue from sitting EOB for so long.  Cognition Arousal/Alertness: Awake/alert Behavior During Therapy: WFL for tasks assessed/performed Overall Cognitive Status: Impaired/Different from baseline Area of Impairment: Orientation;Attention;Memory;Following  commands;Safety/judgement;Awareness;Problem solving                 Orientation Level: Time (not aware of month) Current Attention Level: Selective   Following Commands: Follows one step commands consistently Safety/Judgement: Decreased awareness of deficits Awareness: Emergent Problem Solving: Difficulty sequencing;Requires verbal cues;Requires tactile cues General Comments: Pt aware of stroke and granddaughter in room, some difficulty relaying full level of deficts, time, seems to be ok processing basic commands and maintaining attention with multiple people in the room.  Awareness of her specific deficits and direction of her lean (even with mirror for visual cues) impaired.      Exercises      General Comments        Pertinent Vitals/Pain Pain Assessment: Faces Faces Pain Scale: Hurts little more Pain Location: bil legs Pain Descriptors / Indicators: Grimacing;Guarding Pain Intervention(s): Limited activity within patient's tolerance;Monitored during session;Repositioned    Home Living                      Prior Function            PT Goals (current goals can now be found in the care plan section) Acute Rehab PT Goals Patient Stated Goal: to improve Progress towards PT goals: Progressing toward goals    Frequency    Min 3X/week      PT Plan Current plan remains appropriate    Co-evaluation              AM-PAC PT "6 Clicks" Mobility   Outcome Measure  Help needed turning from your back to your side while in a flat bed without using bedrails?: A Lot Help needed moving from lying on your back to sitting on the side of a flat bed without using bedrails?: A Lot Help needed moving to and from a bed to a chair (including a wheelchair)?: A Lot Help needed standing up from a chair using your arms (e.g., wheelchair or bedside chair)?: A Lot Help needed to walk in hospital room?: Total Help needed climbing 3-5 steps with a railing? : Total 6  Click Score: 10    End of Session   Activity Tolerance: Patient limited by fatigue;Patient limited by pain Patient left: in chair;with call bell/phone within reach;with family/visitor present (granddaughter)   PT Visit Diagnosis: Unsteadiness on feet (R26.81);Other abnormalities of gait and mobility (R26.89);Muscle weakness (generalized) (M62.81);Ataxic gait (R26.0);Difficulty in walking, not elsewhere classified (R26.2);Other symptoms and signs involving the nervous system (R29.898);Dizziness and giddiness (R42);Hemiplegia and hemiparesis Hemiplegia - Right/Left: Right Hemiplegia - dominant/non-dominant: Dominant Hemiplegia - caused by: Cerebral infarction     Time: 1275-1700 PT Time Calculation (min) (ACUTE ONLY): 34 min  Charges:  $Therapeutic Activity: 8-22 mins $Neuromuscular Re-education: 8-22 mins                     Verdene Lennert, PT, DPT  Acute Rehabilitation [phone] pager [phone]) [phone] office

## 2021-02-02 NOTE — Plan of Care (Signed)
Problem: Education: Goal: Knowledge of General Education information will improve Description: Including pain rating scale, medication(s)/side effects and non-pharmacologic comfort measures 02/02/2021 1745 by Laure Kidney, RN Outcome: Progressing 02/02/2021 1744 by Laure Kidney, RN Outcome: Progressing   Problem: Health Behavior/Discharge Planning: Goal: Ability to manage health-related needs will improve 02/02/2021 1745 by Laure Kidney, RN Outcome: Progressing 02/02/2021 1744 by Laure Kidney, RN Outcome: Progressing   Problem: Clinical Measurements: Goal: Ability to maintain clinical measurements within normal limits will improve 02/02/2021 1745 by Laure Kidney, RN Outcome: Progressing 02/02/2021 1744 by Laure Kidney, RN Outcome: Progressing Goal: Will remain free from infection 02/02/2021 1745 by Laure Kidney, RN Outcome: Progressing 02/02/2021 1744 by Laure Kidney, RN Outcome: Progressing Goal: Diagnostic test results will improve 02/02/2021 1745 by Laure Kidney, RN Outcome: Progressing 02/02/2021 1744 by Laure Kidney, RN Outcome: Progressing Goal: Respiratory complications will improve 02/02/2021 1745 by Laure Kidney, RN Outcome: Progressing 02/02/2021 1744 by Laure Kidney, RN Outcome: Progressing Goal: Cardiovascular complication will be avoided 02/02/2021 1745 by Laure Kidney, RN Outcome: Progressing 02/02/2021 1744 by Laure Kidney, RN Outcome: Progressing   Problem: Nutrition: Goal: Adequate nutrition will be maintained 02/02/2021 1745 by Laure Kidney, RN Outcome: Progressing 02/02/2021 1744 by Laure Kidney, RN Outcome: Progressing   Problem: Coping: Goal: Level of anxiety will decrease 02/02/2021 1745 by Laure Kidney, RN Outcome: Progressing 02/02/2021 1744 by Laure Kidney, RN Outcome: Progressing   Problem: Elimination: Goal: Will not experience complications related to urinary retention 02/02/2021 1745 by Laure Kidney,  RN Outcome: Progressing 02/02/2021 1744 by Laure Kidney, RN Outcome: Progressing   Problem: Pain Managment: Goal: General experience of comfort will improve 02/02/2021 1745 by Laure Kidney, RN Outcome: Progressing 02/02/2021 1744 by Laure Kidney, RN Outcome: Progressing   Problem: Safety: Goal: Ability to remain free from injury will improve 02/02/2021 1745 by Laure Kidney, RN Outcome: Progressing 02/02/2021 1744 by Laure Kidney, RN Outcome: Progressing   Problem: Education: Goal: Knowledge of disease or condition will improve 02/02/2021 1745 by Laure Kidney, RN Outcome: Progressing 02/02/2021 [place] by Laure Kidney, RN Outcome: Progressing Goal: Knowledge of secondary prevention will improve 02/02/2021 1745 by Laure Kidney, RN Outcome: Progressing 02/02/2021 [place] by Laure Kidney, RN Outcome: Progressing Goal: Knowledge of patient specific risk factors addressed and post discharge goals established will improve 02/02/2021 1745 by Laure Kidney, RN Outcome: Progressing 02/02/2021 [place] by Laure Kidney, RN Outcome: Progressing Goal: Individualized Educational Video(s) 02/02/2021 1745 by Laure Kidney, RN Outcome: Progressing 02/02/2021 [place] by Laure Kidney, RN Outcome: Progressing   Problem: Coping: Goal: Will verbalize positive feelings about self 02/02/2021 1745 by Laure Kidney, RN Outcome: Progressing 02/02/2021 1744 by Laure Kidney, RN Outcome: Progressing Goal: Will identify appropriate support needs 02/02/2021 1745 by Laure Kidney, RN Outcome: Progressing 02/02/2021 [place] by Laure Kidney, RN Outcome: Progressing   Problem: Health Behavior/Discharge Planning: Goal: Ability to manage health-related needs will improve 02/02/2021 1745 by Laure Kidney, RN Outcome: Progressing 02/02/2021 1744 by Laure Kidney, RN Outcome: Progressing   Problem: Self-Care: Goal: Ability to participate in self-care as condition permits will improve 02/02/2021 1745  by Laure Kidney, RN Outcome: Progressing 02/02/2021 1744 by Laure Kidney, RN Outcome: Progressing Goal: Verbalization of feelings and concerns over difficulty with self-care will improve 02/02/2021 1745 by Laure Kidney, RN Outcome: Progressing 02/02/2021  1744 by Laure Kidney, RN Outcome: Progressing Goal: Ability to communicate needs accurately will improve 02/02/2021 1745 by Laure Kidney, RN Outcome: Progressing 02/02/2021 1744 by Laure Kidney, RN Outcome: Progressing   Problem: Nutrition: Goal: Risk of aspiration will decrease 02/02/2021 1745 by Laure Kidney, RN Outcome: Progressing 02/02/2021 1744 by Laure Kidney, RN Outcome: Progressing   Problem: Ischemic Stroke/TIA Tissue Perfusion: Goal: Complications of ischemic stroke/TIA will be minimized 02/02/2021 1745 by Laure Kidney, RN Outcome: Progressing 02/02/2021 1744 by Laure Kidney, RN Outcome: Progressing

## 2021-02-02 NOTE — Progress Notes (Signed)
PROGRESS NOTE    Carol [name]  WUX:[id number] DOB: January 17, 1928 DOA: 01/27/2021 PCP: Lujean Amel, MD   Chief Complain: Right-sided weakness, slurred speech  Brief Narrative: Patient is a 87 year old female who presented to the emergency department with complaints of right leg weakness/slurred speech.  Work-up on presentation showed acute CVA.  Hospital course also remarkable for UTI, A. fib with RVR.  Cardiology and neurology were following.  Plan for skilled nursing facility placement when hemodynamically stable.  Hospitalist remarkable for sinus tachycardia with PVCs/Afib with RVR and constipation.  Assessment & Plan:   Principal Problem:   Acute CVA (cerebrovascular accident) [facility]) Active Problems:   Hyperlipidemia   Hypothyroidism   Chronic diastolic heart failure (HCC)   Atrial fibrillation (HCC)   AV heart block   Right leg weakness   Acute nonhemorrhagic CVA: Presented with slurred speech, right leg weakness.  MRI showed acute infarcts within the left cerebral hemisphere.  Stroke work-up initiated.  Neurology was following.  Stroke suspected to be embolic in etiology likely from A. fib with RVR and she was on subtherapeutic dose. Stroke work-up has been completed.  Eliquis dose has been increased to 5 mg twice daily.  Continue Lipitor.  Echo showed ejection fraction of 60-65%, no intracardiac source of emboli.  Hemoglobin A1c of 4.5.  TSH is normal.  LDL 48 PT/OT recommending skilled nursing facility on discharge. She should follow-up with [facility] neurology in 4 weeks.  Acute metabolic encephalopathy: At baseline, she is mostly alert and oriented.  She is from independent living facility.  Walks with help of walker/cane.  Acute encephalopathy was thought to be secondary to UTI/acute CVA.  Continue monitoring mental status.  Delirium precautions.   CT head follow-up done on 02/01/2019 did not show any acute intracranial abnormalities.  Continue supportive care.  She remains  alert and awake, orientation is fluctuating.  A. fib with RVR/sinus tachycardia with PVCs: Cardiology following.  Currently on metoprolol.  Continue monitoring on telemetry.  She has been tachycardic recently with heart rate in the range of 100-120.  EKG on 01/31/21 showed sinus tachycardia with PVCs/Pacs. EP following, currently on Lopressor.  EP considering pacemaker if needed.Marland Kitchen  History of coronary artery disease: Status post CABG in 2013, has history of severe aortic stenosis status post tissue AVR in 2013.  Follows with cardiology.  Chronic diastolic congestive heart failure: Currently appears euvolemic.  She was also orthostatic and was started on gentle IV fluids, which has been stopped now.  UTI: Urine culture showed Klebsiella pneumoniae, on antibiotics for 5 days course.  Orthostatic hypotension: Started on gentle IV fluids,now stopped.  Cortisol unremarkable.  Continue to monitor  Hyponatremia: Could be from dehydration/poor oral intake and was given IV fluids.  Sodium levels stable in the range of 130s.  Continue to monitor  History of hemolytic anemia:On chronic  prednisone therapy.  Hypothyroidism: Synthyroid  Constipation: Continue aggressive bowel regimen.  Abdominal x-ray did not show any obstruction but showed fecal impaction.  We will try to disimpact her today.  Multiple skin breakdowns: Wound care following.  Debility/deconditioning: PT/OT recommended  Skilled nursing facility on discharge.             DVT prophylaxis:Eliquis Code Status: DNR Family Communication: Granddaughter daughter at bedside Status is: Inpatient  Remains inpatient appropriate because:Inpatient level of care appropriate due to severity of illness   Dispo: The patient is from: Home              Anticipated d/c is  to: SNF              Anticipated d/c date is: 2-3 days              Patient currently is not medically stable to d/c.   Difficult to place patient  No      Consultants: Cardiology, neurology  Procedures: None  Antimicrobials:  Anti-infectives (From admission, onward)   Start     Dose/Rate Route Frequency Ordered Stop   01/30/21 1100  cephALEXin (KEFLEX) capsule 500 mg        500 mg Oral Every 12 hours 01/30/21 1045 02/04/21 0959   01/27/21 1045  cephALEXin (KEFLEX) capsule 500 mg        500 mg Oral Once 01/27/21 1030 01/27/21 1216      Subjective:  Patient seen and examined at the bedside this morning.  Hemodynamically stable.  Heart rate in the range of 100s.  Overall comfortable, slightly confused.  Looks little  uncomfortable but not in significant distress.  Abdomen distended ,had a small bowel movement this morning.   Objective: Vitals:   02/01/21 2034 02/01/21 2136 02/01/21 2313 02/02/21 0337  BP: (!) 93/54 122/60 (!) 93/51 138/61  Pulse: 89 79 95 81  Resp: 17  17 17   Temp: 97.7 F (36.5 C)  98 F (36.7 C) 98.3 F (36.8 C)  TempSrc: Oral  Oral Oral  SpO2: 97%  97% 97%  Weight:      Height:        Intake/Output Summary (Last 24 hours) at 02/02/2021 0747 Last data filed at 02/01/2021 0945 Gross per 24 hour  Intake 0 ml  Output --  Net 0 ml   Filed Weights   01/27/21 1810  Weight: 64.3 kg    Examination:   General exam: Very deconditioned, elderly female Respiratory system: Bilateral equal air entry, normal vesicular breath sounds, no wheezes or crackles  Cardiovascular system: A. fib. No JVD, murmurs, rubs, gallops or clicks. Gastrointestinal system: Abdomen is distended, soft and nontender. No organomegaly or masses felt.  Hyperactive bowel sounds heard. Central nervous system: Alert and awake but not oriented to time Extremities: No edema, no clubbing ,no cyanosis, scattered ecchymosis, skin breakdowns    Data Reviewed: I have personally reviewed following labs and imaging studies  CBC: Recent Labs  Lab 01/27/21 0643 01/27/21 0647 02/01/21 0337  WBC 8.5  --  11.3*  NEUTROABS 5.8  --   8.6*  HGB 12.4 13.3 12.0  HCT 38.6 39.0 37.1  MCV 90.2  --  87.1  PLT 204  --  580   Basic Metabolic Panel: Recent Labs  Lab 01/27/21 0643 01/27/21 0647 01/29/21 0202 01/30/21 0253 01/31/21 0140 01/31/21 1214 02/01/21 0337  NA 132* 134* 131* 130* 131*  --  132*  K 4.0 4.0 3.8 3.7 4.0  --  4.1  CL 97* 97* 98 98 99  --  99  CO2 26  --  25 23 22   --  22  GLUCOSE 115* 106* 108* 103* 111*  --  136*  BUN 18 21 20 23  24*  --  29*  CREATININE 1.04* 1.00 1.00 1.02* 0.89  --  1.03*  CALCIUM 9.3  --  8.8* 8.0* 8.5*  --  9.2  MG  --   --  2.1  --   --  2.0  --    GFR: Estimated Creatinine Clearance: 31.3 mL/min (A) (by C-G formula based on SCr of 1.03 mg/dL (H)). Liver Function Tests:  Recent Labs  Lab 01/27/21 0643  AST 22  ALT 17  ALKPHOS 54  BILITOT 1.0  PROT 6.5  ALBUMIN 3.8   No results for input(s): LIPASE, AMYLASE in the last 168 hours. Recent Labs  Lab 02/01/21 1317  AMMONIA 17   Coagulation Profile: Recent Labs  Lab 01/27/21 0643  INR 1.1   Cardiac Enzymes: No results for input(s): CKTOTAL, CKMB, CKMBINDEX, TROPONINI in the last 168 hours. BNP (last 3 results) No results for input(s): PROBNP in the last 8760 hours. HbA1C: No results for input(s): HGBA1C in the last 72 hours. CBG: Recent Labs  Lab 01/27/21 0647  GLUCAP 108*   Lipid Profile: No results for input(s): CHOL, HDL, LDLCALC, TRIG, CHOLHDL, LDLDIRECT in the last 72 hours. Thyroid Function Tests: No results for input(s): TSH, T4TOTAL, FREET4, T3FREE, THYROIDAB in the last 72 hours. Anemia Panel: No results for input(s): VITAMINB12, FOLATE, FERRITIN, TIBC, IRON, RETICCTPCT in the last 72 hours. Sepsis Labs: No results for input(s): PROCALCITON, LATICACIDVEN in the last 168 hours.  Recent Results (from the past 240 hour(s))  Urine culture     Status: Abnormal   Collection Time: 01/27/21  5:32 PM   Specimen: Urine, Random  Result Value Ref Range Status   Specimen Description URINE, RANDOM   Final   Special Requests   Final    NONE Performed at [facility], [address], [place], [place] [address]    Culture >=100,000 COLONIES/mL KLEBSIELLA PNEUMONIAE (A)  Final   Report Status 01/29/2021 FINAL  Final   Organism ID, Bacteria KLEBSIELLA PNEUMONIAE (A)  Final      Susceptibility   Klebsiella pneumoniae - MIC*    AMPICILLIN >=32 RESISTANT Resistant     CEFAZOLIN <=4 SENSITIVE Sensitive     CEFEPIME <=0.12 SENSITIVE Sensitive     CEFTRIAXONE <=0.25 SENSITIVE Sensitive     CIPROFLOXACIN <=0.25 SENSITIVE Sensitive     GENTAMICIN <=1 SENSITIVE Sensitive     IMIPENEM <=0.25 SENSITIVE Sensitive     NITROFURANTOIN 32 SENSITIVE Sensitive     TRIMETH/SULFA >=320 RESISTANT Resistant     AMPICILLIN/SULBACTAM >=32 RESISTANT Resistant     PIP/TAZO <=4 SENSITIVE Sensitive     * >=100,000 COLONIES/mL KLEBSIELLA PNEUMONIAE  SARS CORONAVIRUS 2 (TAT 6-24 HRS) Nasopharyngeal Nasopharyngeal Swab     Status: None   Collection Time: 01/28/21  9:58 AM   Specimen: Nasopharyngeal Swab  Result Value Ref Range Status   SARS Coronavirus 2 NEGATIVE NEGATIVE Final    Comment: (NOTE) SARS-CoV-2 target nucleic acids are NOT DETECTED.  The SARS-CoV-2 RNA is generally detectable in upper and lower respiratory specimens during the acute phase of infection. Negative results do not preclude SARS-CoV-2 infection, do not rule out co-infections with other pathogens, and should not be used as the sole basis for treatment or other patient management decisions. Negative results must be combined with clinical observations, patient history, and epidemiological information. The expected result is Negative.  Fact Sheet for Patients: SugarRoll.be  Fact Sheet for Healthcare Providers: https://www.woods-mathews.com/  This test is not yet approved or cleared by the [place] FDA and  has been authorized for detection and/or diagnosis of SARS-CoV-2 by FDA  under an Emergency Use Authorization (EUA). This EUA will remain  in effect (meaning this test can be used) for the duration of the COVID-19 declaration under Se ction 564(b)(1) of the Act, 21 U.S.C. section 360bbb-3(b)(1), unless the authorization is terminated or revoked sooner.  Performed at [facility], [address] [place], [place] [address]   SARS Coronavirus 2 by RT PCR (hospital order, performed in [facility] hospital lab) Nasopharyngeal Nasopharyngeal Swab     Status: None   Collection Time: 01/30/21 11:10 AM   Specimen: Nasopharyngeal Swab  Result Value Ref Range Status   SARS Coronavirus 2 NEGATIVE NEGATIVE Final    Comment: (NOTE) SARS-CoV-2 target nucleic acids are NOT DETECTED.  The SARS-CoV-2 RNA is generally detectable in upper and lower respiratory specimens during the acute phase of infection. The lowest concentration of SARS-CoV-2 viral copies this assay can detect is 250 copies / mL. A negative result does not preclude SARS-CoV-2 infection and should not be used as the sole basis for treatment or other patient management decisions.  A negative result may occur with improper specimen collection / handling, submission of specimen other than nasopharyngeal swab, presence of viral mutation(s) within the areas targeted by this assay, and inadequate number of viral copies (<250 copies / mL). A negative result must be combined with clinical observations, patient history, and epidemiological information.  Fact Sheet for Patients:   StrictlyIdeas.no  Fact Sheet for Healthcare Providers: BankingDealers.co.za  This test is not yet approved or  cleared by the [place] FDA and has been authorized for detection and/or diagnosis of SARS-CoV-2 by FDA under an Emergency Use Authorization (EUA).  This EUA will remain in effect (meaning this test can be used) for the duration of the COVID-19 declaration under  Section 564(b)(1) of the Act, 21 U.S.C. section 360bbb-3(b)(1), unless the authorization is terminated or revoked sooner.  Performed at [facility], [place] [address], [place], [place] [address]          Radiology Studies: DG Abd 1 View  Result Date: 02/01/2021 CLINICAL DATA:  Abdominal pain and distension EXAM: ABDOMEN - 1 VIEW COMPARISON:  CT abdomen 05/14/2018 FINDINGS: Small bowel pattern is unremarkable. Large amount of gas and stool throughout the colon. Peripheral gas pattern in the rectum presumed to be peripheral gas around impacted stool. The unlikely possibility pneumatosis is not excluded. Arterial vascular calcification is noted throughout. Clips in the right upper quadrant consistent with previous cholecystectomy. Scoliosis and degenerative changes of the spine. IMPRESSION: Large amount of gas and stool throughout the colon. Peripheral gas pattern in the rectum presumed to be peripheral gas around impacted stool. The unlikely possibility of pneumatosis is not excluded. If there is clinical concern regarding the possibility bowel infarction, consider CT of the abdomen pelvis. Otherwise, the findings could simply be due to constipation. Electronically Signed   By: Nelson Chimes M.D.   On: 02/01/2021 19:26   CT HEAD WO CONTRAST  Result Date: 02/01/2021 CLINICAL DATA:  Delirium. EXAM: CT HEAD WITHOUT CONTRAST TECHNIQUE: Contiguous axial images were obtained from the base of the skull through the vertex without intravenous contrast. COMPARISON:  Noncontrast head CT 01/27/2021. Brain MRI 01/27/2021. CT angiogram head/neck 01/28/2021. FINDINGS: Brain: Moderate cerebral and cerebellar atrophy. Known early subacute infarct within the left corona radiata/basal ganglia. Background moderate multifocal T2/FLAIR hyperintensity within the cerebral white matter is nonspecific compatible with chronic small vessel ischemic disease. Redemonstrated small early subacute infarct within the left  cerebellar hemisphere. Superimposed small chronic infarcts within the bilateral cerebellar hemispheres. There is no acute intracranial hemorrhage. No extra-axial fluid collection. No evidence of intracranial mass. No midline shift. Vascular: No hyperdense vessel.  Atherosclerotic calcifications. Skull: Normal. Negative for fracture or focal lesion. Sinuses/Orbits: Visualized orbits show no acute finding. Trace ethmoid sinus mucosal thickening.  IMPRESSION: No evidence of interval acute intracranial abnormality. Known subacute infarcts within the left corona radiata/basal ganglia and left cerebellar hemisphere. Stable background moderate generalized atrophy of the brain and chronic small vessel ischemic disease. Unchanged small remote infarcts within the bilateral cerebellar hemispheres. Electronically Signed   By: Kellie Simmering DO   On: 02/01/2021 14:59        Scheduled Meds: . ALPRAZolam  0.25 mg Oral QHS  . apixaban  5 mg Oral BID  . atorvastatin  20 mg Oral QHS  . cephALEXin  500 mg Oral Q12H  . levothyroxine  88 mcg Oral QAC breakfast  . metoprolol tartrate  50 mg Oral BID  . nystatin   Topical BID  . oxybutynin  5 mg Oral QODAY  . polyethylene glycol  17 g Oral BID  . predniSONE  5 mg Oral Q breakfast  . senna  2 tablet Oral Daily   Continuous Infusions:    LOS: 6 days    Time spent: 35 mins.More than 50% of that time was spent in counseling and/or coordination of care.      Shelly Coss, MD [facility] P2/09/2021, 7:47 AM

## 2021-02-02 NOTE — Progress Notes (Signed)
Occupational Therapy Treatment Patient Details Name: Carol [name] MRN: [id number] DOB: [birth year] Today's Date: 02/02/2021    History of present illness Pt is a 87 y.o. female who presents with R leg weakness and slurred speech. 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 PMH: acute blood loss anemia, autoimmune hemoyltic anemia, a-fib on anticoagulation, heart disease, chronic diastolic heart failure, spinal stenosis, sciatica, s/p AVR and CABG, dyspnea, CAD, aortic stenosis, HTN, and hypothyroidism.   OT comments  Patient met seated in recliner with granddaughter present at bedside. OT treatment session with focus on RUE NMR, ADL transfers, and activity tolerance. Patient with noted R lateral lean in recliner at rest requiring multimodal cues and visual feedback from mirror to correct. Patient able to maintain midline for brief period of time only, requiring frequent cueing prior to transfers. Patient indicating need for BM. Education provided on options including BSC vs bedpan. Patient noting increased fatigue with desire to use bedpan this date. Mod A +2 for sit to stand in stedy and light Mod A +2 for stand from perched position in stedy. Patient required Mod A +2 for return to supine 2/2 fatigue but able to roll to R with Min A for placement of bedpan. Patient positioned with pillow underneath RUE to maintain integrity of shoulder joint and towel roll on R to prevent pain/over stretching in cervical neck. Patient would benefit from continued acute OT services in prep for safe d/c to next level of care. SNF rehab continues to be appropriate.    Follow Up Recommendations  SNF;Supervision/Assistance - 24 hour    Equipment Recommendations  Other (comment) (Defer to next level of care)    Recommendations for Other Services      Precautions / Restrictions Precautions Precautions: Fall Precaution Comments: Rt  inattention; pushes to R; monitor HR; orthostatic hypotension; HOH; impaired vision Restrictions Weight Bearing Restrictions: No       Mobility Bed Mobility Overal bed mobility: Needs Assistance Bed Mobility: Sit to Supine     Supine to sit: Mod assist;+2 for physical assistance     General bed mobility comments: Mod A +2 at trunk and BLE 2/2 fatigue.  Transfers Overall transfer level: Needs assistance   Transfers: Sit to/from Stand;Stand Pivot Transfers Sit to Stand: Mod assist;+2 physical assistance Stand pivot transfers:  (on stedy, leaning heavily to the R)       General transfer comment: Patient with R lateral lean in standing with use of Stedy. Able to self-correct with multimodal cues and visual feedback from mirror.    Balance Overall balance assessment: Needs assistance Sitting-balance support: Bilateral upper extremity supported;Single extremity supported;Feet supported Sitting balance-Leahy Scale: Poor Sitting balance - Comments: Patient resting to R of recliner. Mirror used throughout treatment session for visual feedback. Patient able to come to midline but unable to maintain without cueing. Postural control: Right lateral lean (push) Standing balance support: Bilateral upper extremity supported Standing balance-Leahy Scale: Poor Standing balance comment: Heavy reliance on external assist from others. Anterior bias with difficulty attaining full upright position this date 2/2 fatigue.                           ADL either performed or assessed with clinical judgement   ADL                           Toilet Transfer: +2  for physical assistance;Moderate assistance Toilet Transfer Details (indicate cue type and reason): Patient with fatigue from previous PT session declining transfer to [facility]. Patient with desire to complete toileting at bed level. Mod A +2 for sit to stand in [place].                 Vision       Perception      Praxis      Cognition Arousal/Alertness: Awake/alert Behavior During Therapy: WFL for tasks assessed/performed Overall Cognitive Status: Impaired/Different from baseline Area of Impairment: Orientation;Attention;Memory;Following commands;Safety/judgement;Awareness;Problem solving                 Orientation Level: Time (not aware of month) Current Attention Level: Selective   Following Commands: Follows one step commands consistently Safety/Judgement: Decreased awareness of deficits Awareness: Emergent Problem Solving: Difficulty sequencing;Requires verbal cues;Requires tactile cues General Comments: Pt aware of stroke and granddaughter in room, some difficulty relaying full level of deficts, time, seems to be ok processing basic commands and maintaining attention with multiple people in the room.  Awareness of her specific deficits and direction of her lean (even with mirror for visual cues) impaired.        Exercises     Shoulder Instructions       General Comments Multiple skin tears covered with gauze.    Pertinent Vitals/ Pain       Pain Assessment: Faces Faces Pain Scale: Hurts little more Pain Location: bil legs Pain Descriptors / Indicators: Grimacing;Guarding Pain Intervention(s): Limited activity within patient's tolerance;Monitored during session  Home Living                                          Prior Functioning/Environment              Frequency  Min 2X/week        Progress Toward Goals  OT Goals(current goals can now be found in the care plan section)  Progress towards OT goals: Not progressing toward goals - comment (Patient limited by fatigue this session.)  Acute Rehab OT Goals Patient Stated Goal: to improve OT Goal Formulation: With patient/family Time For Goal Achievement: 02/11/21 Potential to Achieve Goals: Fair ADL Goals Pt Will Perform Grooming: with min guard assist;sitting Pt Will Perform Upper Body  Dressing: with min guard assist;sitting Pt Will Perform Lower Body Dressing: with mod assist;sit to/from stand Pt Will Transfer to Toilet: with mod assist;ambulating Pt Will Perform Toileting - Clothing Manipulation and hygiene: with mod assist;sitting/lateral leans Pt/caregiver will Perform Home Exercise Program: Right Upper extremity;With Supervision;With written HEP provided Additional ADL Goal #1: Pt will locate grooming items on the Right side of environment using visual compensatory strategies 75% of the time without cues  Plan Discharge plan remains appropriate;Frequency remains appropriate    Co-evaluation                 AM-PAC OT "6 Clicks" Daily Activity     Outcome Measure   Help from another person eating meals?: A Lot Help from another person taking care of personal grooming?: A Lot Help from another person toileting, which includes using toliet, bedpan, or urinal?: A Lot Help from another person bathing (including washing, rinsing, drying)?: A Lot Help from another person to put on and taking off regular upper body clothing?: A Lot Help from another person to put on and taking off regular  lower body clothing?: A Lot 6 Click Score: 12    End of Session Equipment Utilized During Treatment: Gait belt  OT Visit Diagnosis: Unsteadiness on feet (R26.81);Other abnormalities of gait and mobility (R26.89);Muscle weakness (generalized) (M62.81);Other symptoms and signs involving the nervous system (R29.898);Other symptoms and signs involving cognitive function   Activity Tolerance Patient limited by fatigue   Patient Left in bed;with call bell/phone within reach;with bed alarm set;with family/visitor present   Nurse Communication          Time: 3845-3646 OT Time Calculation (min): 25 min  Charges: OT General Charges $OT Visit: 1 Visit OT Treatments $Self Care/Home Management : 8-22 mins $Neuromuscular Re-education: 8-22 mins   H. OTR/L Supplemental  OT, Department of rehab services [phone]    R H. 02/02/2021, 12:57 PM

## 2021-02-03 ENCOUNTER — Inpatient Hospital Stay (HOSPITAL_COMMUNITY): Payer: Medicare Other

## 2021-02-03 DIAGNOSIS — I639 Cerebral infarction, unspecified: Secondary | ICD-10-CM | POA: Diagnosis not present

## 2021-02-03 DIAGNOSIS — Z66 Do not resuscitate: Secondary | ICD-10-CM | POA: Diagnosis not present

## 2021-02-03 DIAGNOSIS — I4891 Unspecified atrial fibrillation: Secondary | ICD-10-CM | POA: Diagnosis not present

## 2021-02-03 DIAGNOSIS — I480 Paroxysmal atrial fibrillation: Secondary | ICD-10-CM | POA: Diagnosis not present

## 2021-02-03 DIAGNOSIS — Z7189 Other specified counseling: Secondary | ICD-10-CM | POA: Diagnosis not present

## 2021-02-03 DIAGNOSIS — Z515 Encounter for palliative care: Secondary | ICD-10-CM | POA: Diagnosis not present

## 2021-02-03 DIAGNOSIS — I4430 Unspecified atrioventricular block: Secondary | ICD-10-CM | POA: Diagnosis not present

## 2021-02-03 LAB — GLUCOSE, CAPILLARY: Glucose-Capillary: 135 mg/dL — ABNORMAL HIGH (ref 70–99)

## 2021-02-03 MED ORDER — QUETIAPINE FUMARATE 25 MG PO TABS
25.0000 mg | ORAL_TABLET | Freq: Every day | ORAL | Status: DC
Start: 2021-02-03 — End: 2021-02-05
  Administered 2021-02-03 – 2021-02-04 (×2): 25 mg via ORAL
  Filled 2021-02-03 (×2): qty 1

## 2021-02-03 MED ORDER — MELATONIN 3 MG PO TABS
3.0000 mg | ORAL_TABLET | Freq: Every day | ORAL | Status: DC
Start: 2021-02-03 — End: 2021-02-06
  Administered 2021-02-03 – 2021-02-05 (×3): 3 mg via ORAL
  Filled 2021-02-03 (×3): qty 1

## 2021-02-03 NOTE — Progress Notes (Signed)
PROGRESS NOTE    Carol [name]  RWE:[id number] DOB: [birth year] DOA: 01/27/2021 PCP: Lujean Amel, MD   Chief Complain: Right-sided weakness, slurred speech  Brief Narrative: Patient is a 87 year old female who presented to the emergency department with complaints of right leg weakness/slurred speech.  Work-up on presentation showed acute CVA.  Hospital course also remarkable for UTI, A. fib with RVR.  Cardiology and neurology were following.  Plan for skilled nursing facility placement when hemodynamically stable.  Hospital Course  remarkable for sinus tachycardia with PVCs/Afib with RVR , constipation, altered mental status.  Trying to stabilize before discharge to skilled nursing facility.  Assessment & Plan:   Principal Problem:   Acute CVA (cerebrovascular accident) [facility]) Active Problems:   Hyperlipidemia   Hypothyroidism   Chronic diastolic heart failure (HCC)   Atrial fibrillation (HCC)   AV heart block   Right leg weakness   Acute nonhemorrhagic CVA: Presented with slurred speech, right leg weakness.  MRI showed acute infarcts within the left cerebral hemisphere.  Stroke work-up initiated.  Neurology was following.  Stroke suspected to be embolic in etiology likely from A. fib with RVR and she was on subtherapeutic dose. Stroke work-up has been completed.  Eliquis dose has been increased to 5 mg twice daily.  Continue Lipitor.  Echo showed ejection fraction of 60-65%, no intracardiac source of emboli.  Hemoglobin A1c of 4.5.  TSH is normal.  LDL 48 PT/OT recommending skilled nursing facility on discharge. She should follow-up with [facility] neurology in 4 weeks.  Acute metabolic encephalopathy: Confused on presentation.At baseline, she is mostly alert and oriented.  She is from independent living facility.  Walks with help of walker/cane.  Acute encephalopathy was thought to be secondary to UTI/acute CVA/possible underlying dementia, delirium.  Continue monitoring mental  status.  Delirium precautions.   CT head follow-up done on 02/01/2019 did not show any acute intracranial abnormalities.  Continue supportive care.  She remains alert and awake, orientation is fluctuating. Family is concerned about her confusion.  I have explained several times that this could be from hospital-acquired delirium versus the stroke.  Started on low-dose Seroquel, melatonin at bedtime.  I will request MRI of the head to rule out new stroke.  There is very little that we can do for her confusion at this time.  We will just wait and watch.  Ammonia level is normal.  A. fib with RVR/sinus tachycardia with PVCs: Cardiology following.  Currently on metoprolol.  Continue monitoring on telemetry.  She has been tachycardic recently with heart rate in the range of 100-120.  EKG on 01/31/21 showed sinus tachycardia with PVCs/Pacs. EP following, currently on Lopressor.  EP considering pacemaker if needed.Marland Kitchen  History of coronary artery disease: Status post CABG in 2013, has history of severe aortic stenosis status post tissue AVR in 2013.  Follows with cardiology.  Chronic diastolic congestive heart failure: Currently appears euvolemic.  She was also orthostatic and was started on gentle IV fluids, which has been stopped now.  UTI: Urine culture showed Klebsiella pneumoniae, on antibiotics for 5 days course.  Orthostatic hypotension: Started on gentle IV fluids,now stopped.  Cortisol unremarkable.  Continue to monitor  Hyponatremia: Could be from dehydration/poor oral intake and was given IV fluids.  Sodium levels stable in the range of 130s.  Continue to monitor  History of hemolytic anemia:On chronic  prednisone therapy.  Hypothyroidism: Synthyroid  Constipation: Continue aggressive bowel regimen.  Abdominal x-ray did not show any obstruction but showed fecal  impaction.  Disimpacted  Multiple skin breakdowns: Wound care following.  Debility/deconditioning: PT/OT recommended  Skilled nursing  facility on discharge.  Goals of care: Multiple comorbidities, advanced age.  Requested palliative care consult for goals of care discussion             DVT prophylaxis:Eliquis Code Status: DNR Family Communication: Granddaughter daughter at bedside Status is: Inpatient  Remains inpatient appropriate because:Inpatient level of care appropriate due to severity of illness   Dispo: The patient is from: Home              Anticipated d/c is to: SNF              Anticipated d/c date is: 2-3 days              Patient currently is not medically stable to d/c.   Difficult to place patient No      Consultants: Cardiology, neurology  Procedures: None  Antimicrobials:  Anti-infectives (From admission, onward)   Start     Dose/Rate Route Frequency Ordered Stop   01/30/21 1100  cephALEXin (KEFLEX) capsule 500 mg        500 mg Oral Every 12 hours 01/30/21 1045 02/04/21 0959   01/27/21 1045  cephALEXin (KEFLEX) capsule 500 mg        500 mg Oral Once 01/27/21 1030 01/27/21 1216      Subjective:  Patient seen and examined the bedside this morning.  Hemodynamically stable during my evaluation.  Heart rate is better.  She still remains confused but more alert than yesterday.  She talked to me.  She had a good bowel movement yesterday.   Objective: Vitals:   02/02/21 1534 02/02/21 2010 02/02/21 2344 02/03/21 0325  BP: (!) 157/89 122/89 118/80 117/84  Pulse: (!) 101 77 (!) 52 88  Resp: 20  17 18   Temp: 98.1 F (36.7 C) (!) 97.5 F (36.4 C) 98.8 F (37.1 C) 98 F (36.7 C)  TempSrc: Oral Oral Oral   SpO2: 100% 96% 98% 95%  Weight:      Height:       No intake or output data in the 24 hours ending 02/03/21 0747 Filed Weights   01/27/21 1810  Weight: 64.3 kg    Examination:   General exam: Very deconditioned, debilitated elderly female, pleasantly confused Respiratory system: Bilateral equal air entry, normal vesicular breath sounds, no wheezes or crackles   Cardiovascular system: Irregularly irregular rhythm. No JVD, murmurs, rubs, gallops or clicks. Gastrointestinal system: Abdomen is nondistended, soft and nontender. No organomegaly or masses felt. Normal bowel sounds heard. Central nervous system: Alert and awake but not oriented Extremities: No edema, no clubbing ,no cyanosis, scattered ecchymosis  skin: Scattered ecchymosis     Data Reviewed: I have personally reviewed following labs and imaging studies  CBC: Recent Labs  Lab 02/01/21 0337 02/02/21 0809  WBC 11.3* 14.0*  NEUTROABS 8.6* 11.0*  HGB 12.0 10.7*  HCT 37.1 33.4*  MCV 87.1 88.4  PLT 252 650   Basic Metabolic Panel: Recent Labs  Lab 01/29/21 0202 01/30/21 0253 01/31/21 0140 01/31/21 1214 02/01/21 0337 02/02/21 0809  NA 131* 130* 131*  --  132* 133*  K 3.8 3.7 4.0  --  4.1 4.2  CL 98 98 99  --  99 100  CO2 25 23 22   --  22 23  GLUCOSE 108* 103* 111*  --  136* 119*  BUN 20 23 24*  --  29* 41*  CREATININE 1.00  1.02* 0.89  --  1.03* 1.04*  CALCIUM 8.8* 8.0* 8.5*  --  9.2 8.8*  MG 2.1  --   --  2.0  --   --    GFR: Estimated Creatinine Clearance: 31 mL/min (A) (by C-G formula based on SCr of 1.04 mg/dL (H)). Liver Function Tests: No results for input(s): AST, ALT, ALKPHOS, BILITOT, PROT, ALBUMIN in the last 168 hours. No results for input(s): LIPASE, AMYLASE in the last 168 hours. Recent Labs  Lab 02/01/21 1317  AMMONIA 17   Coagulation Profile: No results for input(s): INR, PROTIME in the last 168 hours. Cardiac Enzymes: No results for input(s): CKTOTAL, CKMB, CKMBINDEX, TROPONINI in the last 168 hours. BNP (last 3 results) No results for input(s): PROBNP in the last 8760 hours. HbA1C: No results for input(s): HGBA1C in the last 72 hours. CBG: No results for input(s): GLUCAP in the last 168 hours. Lipid Profile: No results for input(s): CHOL, HDL, LDLCALC, TRIG, CHOLHDL, LDLDIRECT in the last 72 hours. Thyroid Function Tests: No results for  input(s): TSH, T4TOTAL, FREET4, T3FREE, THYROIDAB in the last 72 hours. Anemia Panel: No results for input(s): VITAMINB12, FOLATE, FERRITIN, TIBC, IRON, RETICCTPCT in the last 72 hours. Sepsis Labs: No results for input(s): PROCALCITON, LATICACIDVEN in the last 168 hours.  Recent Results (from the past 240 hour(s))  Urine culture     Status: Abnormal   Collection Time: 01/27/21  5:32 PM   Specimen: Urine, Random  Result Value Ref Range Status   Specimen Description URINE, RANDOM  Final   Special Requests   Final    NONE Performed at [facility], [address], [place], [place] [address]    Culture >=100,000 COLONIES/mL KLEBSIELLA PNEUMONIAE (A)  Final   Report Status 01/29/2021 FINAL  Final   Organism ID, Bacteria KLEBSIELLA PNEUMONIAE (A)  Final      Susceptibility   Klebsiella pneumoniae - MIC*    AMPICILLIN >=32 RESISTANT Resistant     CEFAZOLIN <=4 SENSITIVE Sensitive     CEFEPIME <=0.12 SENSITIVE Sensitive     CEFTRIAXONE <=0.25 SENSITIVE Sensitive     CIPROFLOXACIN <=0.25 SENSITIVE Sensitive     GENTAMICIN <=1 SENSITIVE Sensitive     IMIPENEM <=0.25 SENSITIVE Sensitive     NITROFURANTOIN 32 SENSITIVE Sensitive     TRIMETH/SULFA >=320 RESISTANT Resistant     AMPICILLIN/SULBACTAM >=32 RESISTANT Resistant     PIP/TAZO <=4 SENSITIVE Sensitive     * >=100,000 COLONIES/mL KLEBSIELLA PNEUMONIAE  SARS CORONAVIRUS 2 (TAT 6-24 HRS) Nasopharyngeal Nasopharyngeal Swab     Status: None   Collection Time: 01/28/21  9:58 AM   Specimen: Nasopharyngeal Swab  Result Value Ref Range Status   SARS Coronavirus 2 NEGATIVE NEGATIVE Final    Comment: (NOTE) SARS-CoV-2 target nucleic acids are NOT DETECTED.  The SARS-CoV-2 RNA is generally detectable in upper and lower respiratory specimens during the acute phase of infection. Negative results do not preclude SARS-CoV-2 infection, do not rule out co-infections with other pathogens, and should not be used as the sole basis for  treatment or other patient management decisions. Negative results must be combined with clinical observations, patient history, and epidemiological information. The expected result is Negative.  Fact Sheet for Patients: SugarRoll.be  Fact Sheet for Healthcare Providers: https://www.woods-mathews.com/  This test is not yet approved or cleared by the [place] FDA and  has been authorized for detection and/or diagnosis of SARS-CoV-2 by FDA under an Emergency Use Authorization (EUA). This EUA will remain  in effect (meaning this test can be used) for the duration of the COVID-19 declaration under Se ction 564(b)(1) of the Act, 21 U.S.C. section 360bbb-3(b)(1), unless the authorization is terminated or revoked sooner.  Performed at [facility], [place] [address] [place], [place] [address]   SARS Coronavirus 2 by RT PCR (hospital order, performed in [facility] hospital lab) Nasopharyngeal Nasopharyngeal Swab     Status: None   Collection Time: 01/30/21 11:10 AM   Specimen: Nasopharyngeal Swab  Result Value Ref Range Status   SARS Coronavirus 2 NEGATIVE NEGATIVE Final    Comment: (NOTE) SARS-CoV-2 target nucleic acids are NOT DETECTED.  The SARS-CoV-2 RNA is generally detectable in upper and lower respiratory specimens during the acute phase of infection. The lowest concentration of SARS-CoV-2 viral copies this assay can detect is 250 copies / mL. A negative result does not preclude SARS-CoV-2 infection and should not be used as the sole basis for treatment or other patient management decisions.  A negative result may occur with improper specimen collection / handling, submission of specimen other than nasopharyngeal swab, presence of viral mutation(s) within the areas targeted by this assay, and inadequate number of viral copies (<250 copies / mL). A negative result must be combined with clinical observations, patient history, and  epidemiological information.  Fact Sheet for Patients:   StrictlyIdeas.no  Fact Sheet for Healthcare Providers: BankingDealers.co.za  This test is not yet approved or  cleared by the [place] FDA and has been authorized for detection and/or diagnosis of SARS-CoV-2 by FDA under an Emergency Use Authorization (EUA).  This EUA will remain in effect (meaning this test can be used) for the duration of the COVID-19 declaration under Section 564(b)(1) of the Act, 21 U.S.C. section 360bbb-3(b)(1), unless the authorization is terminated or revoked sooner.  Performed at [facility], [place] [address], [place], [place] [address]          Radiology Studies: DG Abd 1 View  Result Date: 02/01/2021 CLINICAL DATA:  Abdominal pain and distension EXAM: ABDOMEN - 1 VIEW COMPARISON:  CT abdomen 05/14/2018 FINDINGS: Small bowel pattern is unremarkable. Large amount of gas and stool throughout the colon. Peripheral gas pattern in the rectum presumed to be peripheral gas around impacted stool. The unlikely possibility pneumatosis is not excluded. Arterial vascular calcification is noted throughout. Clips in the right upper quadrant consistent with previous cholecystectomy. Scoliosis and degenerative changes of the spine. IMPRESSION: Large amount of gas and stool throughout the colon. Peripheral gas pattern in the rectum presumed to be peripheral gas around impacted stool. The unlikely possibility of pneumatosis is not excluded. If there is clinical concern regarding the possibility bowel infarction, consider CT of the abdomen pelvis. Otherwise, the findings could simply be due to constipation. Electronically Signed   By: Nelson Chimes M.D.   On: 02/01/2021 19:26   CT HEAD WO CONTRAST  Result Date: 02/01/2021 CLINICAL DATA:  Delirium. EXAM: CT HEAD WITHOUT CONTRAST TECHNIQUE: Contiguous axial images were obtained from the base of the skull through the  vertex without intravenous contrast. COMPARISON:  Noncontrast head CT 01/27/2021. Brain MRI 01/27/2021. CT angiogram head/neck 01/28/2021. FINDINGS: Brain: Moderate cerebral and cerebellar atrophy. Known early subacute infarct within the left corona radiata/basal ganglia. Background moderate multifocal T2/FLAIR hyperintensity within the cerebral white matter is nonspecific compatible with chronic small vessel ischemic disease. Redemonstrated small early subacute infarct within the left cerebellar hemisphere. Superimposed small chronic infarcts within the bilateral cerebellar hemispheres. There is no  acute intracranial hemorrhage. No extra-axial fluid collection. No evidence of intracranial mass. No midline shift. Vascular: No hyperdense vessel.  Atherosclerotic calcifications. Skull: Normal. Negative for fracture or focal lesion. Sinuses/Orbits: Visualized orbits show no acute finding. Trace ethmoid sinus mucosal thickening. IMPRESSION: No evidence of interval acute intracranial abnormality. Known subacute infarcts within the left corona radiata/basal ganglia and left cerebellar hemisphere. Stable background moderate generalized atrophy of the brain and chronic small vessel ischemic disease. Unchanged small remote infarcts within the bilateral cerebellar hemispheres. Electronically Signed   By: Kellie Simmering DO   On: 02/01/2021 14:59        Scheduled Meds: . ALPRAZolam  0.25 mg Oral QHS  . apixaban  5 mg Oral BID  . atorvastatin  20 mg Oral QHS  . cephALEXin  500 mg Oral Q12H  . levothyroxine  88 mcg Oral QAC breakfast  . metoprolol tartrate  100 mg Oral Daily   And  . metoprolol tartrate  50 mg Oral QHS  . nystatin   Topical BID  . oxybutynin  5 mg Oral QODAY  . polyethylene glycol  17 g Oral BID  . predniSONE  5 mg Oral Q breakfast  . senna  2 tablet Oral Daily   Continuous Infusions:    LOS: 7 days    Time spent: 35 mins.More than 50% of that time was spent in counseling and/or  coordination of care.      Shelly Coss, MD [facility] P2/10/2021, 7:47 AM

## 2021-02-03 NOTE — Plan of Care (Signed)
  Problem: Activity: Goal: Risk for activity intolerance will decrease Outcome: Progressing   Problem: Coping: Goal: Level of anxiety will decrease Outcome: Progressing   Problem: Pain Managment: Goal: General experience of comfort will improve Outcome: Progressing   Problem: Safety: Goal: Ability to remain free from injury will improve Outcome: Progressing   Problem: Skin Integrity: Goal: Risk for impaired skin integrity will decrease Outcome: Progressing   Problem: Ischemic Stroke/TIA Tissue Perfusion: Goal: Complications of ischemic stroke/TIA will be minimized Outcome: Progressing

## 2021-02-03 NOTE — Progress Notes (Signed)
We received a spiritual care consult for Carol [name].  When I entered her room, family was not present. She had just awoken and was looking for her daughter. She asked for help in getting up to the bathroom.  I assisted her in calling for her nurse and will plan to have someone from our team check in with them at a later time.  Phil Campbell, Bcc Pager, [phone] 4:30 PM

## 2021-02-03 NOTE — Consult Note (Signed)
Palliative Medicine Inpatient Consult Note  Reason for consult:  Goals of Care "Admitted with stroke, very elderly,multiple comorbidities. If doesnt improve,needs to think about residential hospice"  HPI:  Per intake H&P --> Carol [name] is a 87 y.o. female with a PMH of acute blood loss anemia, autoimmune hemolytic anemia, atrial fibrillation on anticoagulation, heart disease, chronic diastolic heart failure, essential hypertension, hypothyroidism. She was admitted on 2/4 for right leg weakness/slurred speech. She was found to have an acute nonhemorrhagic CVA. Also of note (+) UTI and Afib with RVR. Stroke has been suspected to be embolic in nature in the setting of subtherapeutic anticoagulation. Patient has been admitted for the past seven days and remains to show minimal improvements. Palliative care has been asked to discuss goals of care and potential residential hospice.  Clinical Assessment/Goals of Care:  *Please note that this is a verbal dictation therefore any spelling or grammatical errors are due to the "[place] [facility]" system interpretation.  I have reviewed medical records including EPIC notes, labs and imaging, received report from bedside RN, assessed the patient who is lying in bed in no acute distress. She is noted to be disoriented to place.     I call Carol [name] (daughter) to further discuss diagnosis prognosis, GOC, EOL wishes, disposition and options.   I introduced Palliative Medicine as specialized medical care for people living with serious illness. It focuses on providing relief from the symptoms and stress of a serious illness. The goal is to improve quality of life for both the patient and the family.  Carol [name] shares with me that her mother lives in [place], [place].  She is a widow and has 4 children, 2 daughters and 2 sons.  Carol [name] lives locally un [place], her other daughter lives in [place], one of her sons lives in [place] and one of her  sons lives in [place].  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  She has a very involved granddaughter, Carol [name] who is a Holiday representative.  She is a woman who was noted to be quite active at her independent living facility friend's home prior to admission.  She enjoyed listening to books on tape and being social with other residents.  She is a very religious woman and practices Catholicism.  Prior to hospital admission Carol [name] was noted to have been independent of most basic activities of daily living with the exception of medication preparation, meal preparation, and showering.  She had an aide come in a few times a week to help her.  Her daughter set up her medications weekly.  She did utilize a walker for mobility aid.  In regards to her present health state Carol [name] shares with me that Carol [name] has been very altered incrementally since admission.  She suspects this is likely in the setting of her recent stroke.  We discussed the topic of delirium and how this results in fluctuations throughout the day of insight and attention.  We reviewed that this can be a detrimental prognosis in the long run and often leads to a new baseline level of function.  Carol [name] shares that her goals for her mother include her gaining physical strength and ideally becoming as independent as possible.  We reviewed the discussion of pacemaker placement. Carol [name] shares that her mother has historically not wanted a pacemaker through if Dr. Quentin [name] deemed it at necessary she would be  amenable to this.   A detailed discussion was had today regarding advanced directives - we have these on file and [place] which and list Carol [name] as he healthcare power of attorney.  We also have enlisted the desire for natural death.    Concepts specific to code status, artifical feeding and hydration, continued IV antibiotics and rehospitalization  was had. Patient is a designated DNAR/DNI which we confirmed.  I provided a MOST form to her family which we plan to complete over the weekend.  The difference between a aggressive medical intervention path  and a palliative comfort care path for this patient at this time was had.We reviewed the hope for improvement and the possibility of decline. We discussed arranging OP Palliative follow up to reach out to [facility] when she transitions to SNF.  I did broach the topic of hospice if things acutely worsen which her daughter was responsive towards but agreed that we are not yet there.  Discussed the importance of continued conversation with family and their  medical providers regarding overall plan of care and treatment options, ensuring decisions are within the context of the patients values and GOCs. ___________________________________________ Addendum:  I met with patients granddaughter, Carol [name] at bedside. She expressed concerns about Carol [name] cognitive state. She shares that yesterday she was completely aware and alert and today she is not. We discussed delirium. She feels that this is beyond that. I was able to reach out to the primary team to inform them of these concerns.   Decision Maker: Carol [name] (daughter) [phone]  SUMMARY OF RECOMMENDATIONS   DNAR/DNI  Will complete a MOST prior to discharge  TOC - OP Palliative support  Spiritual Support  Goals include gaining strength and ultimately functional improvement  PM would be an ongoing conversation family trusts the judgement of Dr. Quentin [name]  Appreciate primary team updating family given neuro fluctuations  Ongoing PMT support  Code Status/Advance Care Planning: DNAR/DNI    Palliative Prophylaxis:   Oral Care, Mobility, Delirium and Aspiration precuations  Additional Recommendations (Limitations, Scope, Preferences):  Continue current level of care  Psycho-social/Spiritual:   Desire for further  Chaplaincy support: Yes  Additional Recommendations: Education on chronic diseases and acute stroke   Prognosis: Multiple comorbid conditions and advanced ago though had a very functional baseline prior to acute CVA. Remains at high risk for 12 month mortality.  Discharge Planning: Discharge to [facility] SNF with OP Palliative support  Vitals:   02/02/21 2344 02/03/21 0325  BP: 118/80 117/84  Pulse: (!) 52 88  Resp: 17 18  Temp: 98.8 F (37.1 C) 98 F (36.7 C)  SpO2: 98% 95%   No intake or output data in the 24 hours ending 02/03/21 0653 Last Weight  Most recent update: 01/27/2021  6:38 PM   Weight  64.3 kg (141 lb 12.1 oz)           Gen:  Elderly Caucasian F in NAD HEENT: moist mucous membranes CV: Irregular rate and rhythm  PULM: clear to auscultation bilaterally  ABD: soft/nontender  EXT: (+) pedal edema, (+) ecchymosis on LE Neuro: Alert and oriented x1  PPS: 20%   This conversation/these recommendations were discussed with patient primary care team, Dr. Tawanna Solo  Time In: 0900 Time Out: 1020 Total Time: 80 Greater than 50%  of this time was spent counseling and coordinating care related to the above assessment and plan.  [place] Palliative Medicine Team Team Cell Phone: [phone] Please utilize secure chat  with additional questions, if there is no response within 30 minutes please call the above phone number  Palliative Medicine Team providers are available by phone from 7am to 7pm daily and can be reached through the team cell phone.  Should this patient require assistance outside of these hours, please call the patient's attending physician.

## 2021-02-03 NOTE — Progress Notes (Signed)
Electrophysiology Rounding Note  Patient Name: LAURELLE SKIVER Date of Encounter: 02/03/2021  Primary Cardiologist: Jenean Lindau, MD Electrophysiologist: Dr. Quentin Ore   Subjective   Feeling Ok this am. Denies lightheadedness or dizziness. No tachy palpitations. Still feels like she's falling to the left, and at times bracing herself on the edge of the bed with her left arm.   Inpatient Medications    Scheduled Meds: . ALPRAZolam  0.25 mg Oral QHS  . apixaban  5 mg Oral BID  . atorvastatin  20 mg Oral QHS  . cephALEXin  500 mg Oral Q12H  . levothyroxine  88 mcg Oral QAC breakfast  . metoprolol tartrate  100 mg Oral Daily   And  . metoprolol tartrate  50 mg Oral QHS  . nystatin   Topical BID  . oxybutynin  5 mg Oral QODAY  . polyethylene glycol  17 g Oral BID  . predniSONE  5 mg Oral Q breakfast  . senna  2 tablet Oral Daily   Continuous Infusions:  PRN Meds: acetaminophen **OR** acetaminophen (TYLENOL) oral liquid 160 mg/5 mL **OR** acetaminophen, metoprolol tartrate   Vital Signs    Vitals:   02/02/21 1534 02/02/21 2010 02/02/21 2344 02/03/21 0325  BP: (!) 157/89 122/89 118/80 117/84  Pulse: (!) 101 77 (!) 52 88  Resp: 20  17 18   Temp: 98.1 F (36.7 C) (!) 97.5 F (36.4 C) 98.8 F (37.1 C) 98 F (36.7 C)  TempSrc: Oral Oral Oral   SpO2: 100% 96% 98% 95%  Weight:      Height:       No intake or output data in the 24 hours ending 02/03/21 0710 Filed Weights   01/27/21 1810  Weight: 64.3 kg    Physical Exam    GEN- The patient is elderly appearing, alert and oriented x 3 today.   Head- normocephalic, atraumatic Eyes-  Sclera clear, conjunctiva pink Ears- hearing intact Oropharynx- clear Neck- supple Lungs- Clear to ausculation bilaterally, normal work of breathing Heart- Irregularly irregular rate and rhythm, no murmurs, rubs or gallops GI- soft, NT, ND, + BS Extremities- no clubbing or cyanosis. No edema Skin- no rash or lesion Psych-  euthymic mood, full affect Neuro- Right sided hemiparesis.   Labs    CBC Recent Labs    02/01/21 0337 02/02/21 0809  WBC 11.3* 14.0*  NEUTROABS 8.6* 11.0*  HGB 12.0 10.7*  HCT 37.1 33.4*  MCV 87.1 88.4  PLT 252 211   Basic Metabolic Panel Recent Labs    01/31/21 1214 02/01/21 0337 02/02/21 0809  NA  --  132* 133*  K  --  4.1 4.2  CL  --  99 100  CO2  --  22 23  GLUCOSE  --  136* 119*  BUN  --  29* 41*  CREATININE  --  1.03* 1.04*  CALCIUM  --  9.2 8.8*  MG 2.0  --   --    Liver Function Tests No results for input(s): AST, ALT, ALKPHOS, BILITOT, PROT, ALBUMIN in the last 72 hours. No results for input(s): LIPASE, AMYLASE in the last 72 hours. Cardiac Enzymes No results for input(s): CKTOTAL, CKMB, CKMBINDEX, TROPONINI in the last 72 hours.   Telemetry    AF with rates 90-100s, at times higher (personally reviewed)  Radiology    DG Abd 1 View  Result Date: 02/01/2021 CLINICAL DATA:  Abdominal pain and distension EXAM: ABDOMEN - 1 VIEW COMPARISON:  CT abdomen 05/14/2018 FINDINGS: Small  bowel pattern is unremarkable. Large amount of gas and stool throughout the colon. Peripheral gas pattern in the rectum presumed to be peripheral gas around impacted stool. The unlikely possibility pneumatosis is not excluded. Arterial vascular calcification is noted throughout. Clips in the right upper quadrant consistent with previous cholecystectomy. Scoliosis and degenerative changes of the spine. IMPRESSION: Large amount of gas and stool throughout the colon. Peripheral gas pattern in the rectum presumed to be peripheral gas around impacted stool. The unlikely possibility of pneumatosis is not excluded. If there is clinical concern regarding the possibility bowel infarction, consider CT of the abdomen pelvis. Otherwise, the findings could simply be due to constipation. Electronically Signed   By: Nelson Chimes M.D.   On: 02/01/2021 19:26   CT HEAD WO CONTRAST  Result Date:  02/01/2021 CLINICAL DATA:  Delirium. EXAM: CT HEAD WITHOUT CONTRAST TECHNIQUE: Contiguous axial images were obtained from the base of the skull through the vertex without intravenous contrast. COMPARISON:  Noncontrast head CT 01/27/2021. Brain MRI 01/27/2021. CT angiogram head/neck 01/28/2021. FINDINGS: Brain: Moderate cerebral and cerebellar atrophy. Known early subacute infarct within the left corona radiata/basal ganglia. Background moderate multifocal T2/FLAIR hyperintensity within the cerebral white matter is nonspecific compatible with chronic small vessel ischemic disease. Redemonstrated small early subacute infarct within the left cerebellar hemisphere. Superimposed small chronic infarcts within the bilateral cerebellar hemispheres. There is no acute intracranial hemorrhage. No extra-axial fluid collection. No evidence of intracranial mass. No midline shift. Vascular: No hyperdense vessel.  Atherosclerotic calcifications. Skull: Normal. Negative for fracture or focal lesion. Sinuses/Orbits: Visualized orbits show no acute finding. Trace ethmoid sinus mucosal thickening. IMPRESSION: No evidence of interval acute intracranial abnormality. Known subacute infarcts within the left corona radiata/basal ganglia and left cerebellar hemisphere. Stable background moderate generalized atrophy of the brain and chronic small vessel ischemic disease. Unchanged small remote infarcts within the bilateral cerebellar hemispheres. Electronically Signed   By: Kellie Simmering DO   On: 02/01/2021 14:59    Patient Profile     Ms. Cente is a 87 year old woman with a history of coronary artery disease post CABG and paroxysmal atrial fibrillation and flutter. She was admitted with a stroke and had issues with AF rates after lopressor held  Assessment & Plan    1. AF with RVR Rates overall improved but remain elevated with activity.  We will continue to challenge her with rate control Would plan to continue lopressor 100 mg q  am and 50 mg q pm for now.  She has h/o nocturnal CHB, so follow closely for pacing indication(s) She is on Eliquis 5 mg BID for secondary prophylaxis of stroke with CHA2DS2VASC of at least 8.   LVEF 60-65% by Echo 01/29/21  2. Goals of Care Of note, Palliative care has been consulted, but has not yet seen.  Per PT notes pt displays "pushing syndrome" as she tends to lean over to the R side pushing with her LUE in sitting position, and reports like she feels like she is "falling" to the left.  This may complicate healing from pacemaker, and overall her recovery.  Ideally, rate control will be enough to manage her. We will continue to consider her PPM placement will align with her goals of care, as currently she is not very symptomatic with rates in the 90-100s at rest.  Her primary deficits are also R sided, which could further complicate her healing if she were to need pacing.   For questions or updates, please contact [place] Please  consult [email or web address] for contact info under Cardiology/STEMI.  Signed, Shirley Friar, PA-C  02/03/2021, 7:10 AM

## 2021-02-04 DIAGNOSIS — Z7189 Other specified counseling: Secondary | ICD-10-CM | POA: Diagnosis not present

## 2021-02-04 DIAGNOSIS — I4430 Unspecified atrioventricular block: Secondary | ICD-10-CM | POA: Diagnosis not present

## 2021-02-04 DIAGNOSIS — Z515 Encounter for palliative care: Secondary | ICD-10-CM | POA: Diagnosis not present

## 2021-02-04 DIAGNOSIS — Z66 Do not resuscitate: Secondary | ICD-10-CM | POA: Diagnosis not present

## 2021-02-04 DIAGNOSIS — I4891 Unspecified atrial fibrillation: Secondary | ICD-10-CM | POA: Diagnosis not present

## 2021-02-04 LAB — CBC WITH DIFFERENTIAL/PLATELET
Abs Immature Granulocytes: 0.06 10*3/uL (ref 0.00–0.07)
Basophils Absolute: 0.1 10*3/uL (ref 0.0–0.1)
Basophils Relative: 1 %
Eosinophils Absolute: 0.1 10*3/uL (ref 0.0–0.5)
Eosinophils Relative: 1 %
HCT: 32.3 % — ABNORMAL LOW (ref 36.0–46.0)
Hemoglobin: 10.2 g/dL — ABNORMAL LOW (ref 12.0–15.0)
Immature Granulocytes: 1 %
Lymphocytes Relative: 14 %
Lymphs Abs: 1.5 10*3/uL (ref 0.7–4.0)
MCH: 27.8 pg (ref 26.0–34.0)
MCHC: 31.6 g/dL (ref 30.0–36.0)
MCV: 88 fL (ref 80.0–100.0)
Monocytes Absolute: 1.2 10*3/uL — ABNORMAL HIGH (ref 0.1–1.0)
Monocytes Relative: 11 %
Neutro Abs: 7.6 10*3/uL (ref 1.7–7.7)
Neutrophils Relative %: 72 %
Platelets: 241 10*3/uL (ref 150–400)
RBC: 3.67 MIL/uL — ABNORMAL LOW (ref 3.87–5.11)
RDW: 16.5 % — ABNORMAL HIGH (ref 11.5–15.5)
WBC: 10.5 10*3/uL (ref 4.0–10.5)
nRBC: 0 % (ref 0.0–0.2)

## 2021-02-04 LAB — BASIC METABOLIC PANEL
Anion gap: 9 (ref 5–15)
BUN: 33 mg/dL — ABNORMAL HIGH (ref 8–23)
CO2: 23 mmol/L (ref 22–32)
Calcium: 8.7 mg/dL — ABNORMAL LOW (ref 8.9–10.3)
Chloride: 102 mmol/L (ref 98–111)
Creatinine, Ser: 0.91 mg/dL (ref 0.44–1.00)
GFR, Estimated: 60 mL/min — ABNORMAL LOW (ref >60)
Glucose, Bld: 118 mg/dL — ABNORMAL HIGH (ref 70–99)
Potassium: 3.9 mmol/L (ref 3.5–5.1)
Sodium: 134 mmol/L — ABNORMAL LOW (ref 135–145)

## 2021-02-04 MED ORDER — SODIUM CHLORIDE 0.9 % IV BOLUS
500.0000 mL | Freq: Once | INTRAVENOUS | Status: AC
  Administered 2021-02-04: 500 mL via INTRAVENOUS

## 2021-02-04 MED ORDER — SODIUM CHLORIDE 0.9 % IV SOLN: INTRAVENOUS | Status: DC

## 2021-02-04 NOTE — Progress Notes (Addendum)
Palliative Medicine Inpatient Follow Up Note  Reason for consult:  Goals of Care "Admitted with stroke, very elderly,multiple comorbidities. If doesnt improve,needs to think about residential hospice"  HPI:  Per intake H&P --> Carol R Lucenteis a 87 y.o.femalewith a PMH of acute blood loss anemia, autoimmune hemolytic anemia, atrial fibrillation on anticoagulation, heart disease, chronic diastolic heart failure, essential hypertension, hypothyroidism. She was admitted on 2/4 for right leg weakness/slurred speech. She was found to have an acute nonhemorrhagic CVA. Also of note (+) UTI and Afib with RVR. Stroke has been suspected to be embolic in nature in the setting of subtherapeutic anticoagulation. Patient has been admitted for the past seven days and remains to show minimal improvements. Palliative care has been asked to discuss goals of care and potential residential hospice.  Today's Discussion (02/04/2021):  *Please note that this is a verbal dictation therefore any spelling or grammatical errors are due to the "[place] One" system interpretation.  Chart reviewed. I met with [name] at bedside this morning. She was not noted to be in any distress. She was more clear minded and answered my orientation questions appropriately this morning.   With the help of patients nursing technician, Essance and evening RN, we were able to get her up to the chair this morning for breakfast. She was maximum assistance.  I was able to go by [name]'s room again in the early afternoon. Her granddaughter, [name] was present. She shares with me that they are working on the MOST form but are having a hard time deciding if tube feeding should be pursued. I acknowledge that often it is much harder to stop an intervention than it is to start one, which she agrees with.  During this time patient had to get up to the bedside commode therefore our time was cut short. Discussed completion of the MOST  tomorrow.  Aided patient with bedside commode with nursing staff - remains a max assistance.  Questions and concerns addressed   Objective Assessment: Vital Signs Vitals:   02/04/21 0348 02/04/21 0754  BP: 117/61 120/68  Pulse: 80 89  Resp: 17 20  Temp: 98 F (36.7 C) 98.2 F (36.8 C)  SpO2: 97% 94%    Intake/Output Summary (Last 24 hours) at 02/04/2021 1127 Last data filed at 02/03/2021 1654 Gross per 24 hour  Intake --  Output 400 ml  Net -400 ml   Last Weight  Most recent update: 01/27/2021  6:38 PM   Weight  64.3 kg (141 lb 12.1 oz)           Gen:  Elderly Caucasian F in NAD HEENT: moist mucous membranes CV: Irregular rate and rhythm  PULM: clear to auscultation bilaterally  ABD: soft/nontender  EXT: (+) pedal edema, (+) ecchymosis on LE Neuro: Alert and oriented x2-3  SUMMARY OF RECOMMENDATIONS   DNAR/DNI  Will complete a MOST prior to discharge - Family is struggling with the artificial feeding part    TOC - OP Palliative support  Spiritual Support  Goals include gaining strength and ultimately functional improvement  PM would be an ongoing conversation family trusts the judgement of Dr. Quentin Ore  Appreciate primary team updating family given neuro fluctuations  Time Spent: 35 Greater than 50% of the time was spent in counseling and coordination of care ______________________________________________________________________________________ [place] Team Team Cell Phone: [phone] Please utilize secure chat with additional questions, if there is no response within 30 minutes please call the above phone number  Palliative Medicine Team providers are available by phone from 7am to 7pm daily and can be reached through the team cell phone.  Should this patient require assistance outside of these hours, please call the patient's attending physician.

## 2021-02-04 NOTE — Plan of Care (Signed)
  Problem: Education: Goal: Knowledge of General Education information will improve Description: Including pain rating scale, medication(s)/side effects and non-pharmacologic comfort measures Outcome: Progressing   Problem: Health Behavior/Discharge Planning: Goal: Ability to manage health-related needs will improve Outcome: Progressing   Problem: Clinical Measurements: Goal: Ability to maintain clinical measurements within normal limits will improve Outcome: Progressing Goal: Will remain free from infection Outcome: Progressing Goal: Diagnostic test results will improve Outcome: Progressing Goal: Respiratory complications will improve Outcome: Progressing Goal: Cardiovascular complication will be avoided Outcome: Progressing   Problem: Coping: Goal: Level of anxiety will decrease Outcome: Progressing   Problem: Elimination: Goal: Will not experience complications related to urinary retention Outcome: Progressing   Problem: Pain Managment: Goal: General experience of comfort will improve Outcome: Progressing   Problem: Safety: Goal: Ability to remain free from injury will improve Outcome: Progressing   Problem: Education: Goal: Knowledge of disease or condition will improve Outcome: Progressing Goal: Knowledge of secondary prevention will improve Outcome: Progressing Goal: Knowledge of patient specific risk factors addressed and post discharge goals established will improve Outcome: Progressing Goal: Individualized Educational Video(s) Outcome: Progressing   Problem: Coping: Goal: Will verbalize positive feelings about self Outcome: Progressing Goal: Will identify appropriate support needs Outcome: Progressing   Problem: Health Behavior/Discharge Planning: Goal: Ability to manage health-related needs will improve Outcome: Progressing   Problem: Self-Care: Goal: Ability to participate in self-care as condition permits will improve Outcome: Progressing Goal:  Verbalization of feelings and concerns over difficulty with self-care will improve Outcome: Progressing Goal: Ability to communicate needs accurately will improve Outcome: Progressing   Problem: Nutrition: Goal: Risk of aspiration will decrease Outcome: Progressing   Problem: Ischemic Stroke/TIA Tissue Perfusion: Goal: Complications of ischemic stroke/TIA will be minimized Outcome: Progressing

## 2021-02-04 NOTE — TOC Progression Note (Signed)
Transition of Care [facility]) - Progression Note    Patient Details  Name: Carol [name] MRN: [id number] Date of Birth: 11/09/1928  Transition of Care [facility]) CM/SW [place], [place] Phone Number: 02/04/2021, 2:51 PM  Clinical Narrative:    CSW was advised by MD that this pt would be ready to return to [facility] tomorrow. CSW attempted to contact the facility, a VM was left. CSW updated MD that a weekend DC may not be possible. SW will follow for DC planning.   Expected Discharge Plan: [place] Barriers to Discharge: Continued Medical Work up  Expected Discharge Plan and Services Expected Discharge Plan: [place] Choice: [place] arrangements for the past 2 months: Assisted Living Facility                                       Social Determinants of Health (SDOH) Interventions    Readmission Risk Interventions No flowsheet data found.

## 2021-02-04 NOTE — Progress Notes (Signed)
PROGRESS NOTE    Carol [name]  RKY:[id number] DOB: Feb 13, 1928 DOA: 01/27/2021 PCP: Lujean Amel, MD   Chief Complain: Right-sided weakness, slurred speech  Brief Narrative: Patient is a 87 year old female who presented to the emergency department with complaints of right leg weakness/slurred speech.  Work-up on presentation showed acute CVA.  Hospital course also remarkable for UTI, A. fib with RVR.  Cardiology and neurology were following.  Plan for skilled nursing facility placement when hemodynamically stable.  Hospital Course  remarkable for sinus tachycardia with PVCs/Afib with RVR , constipation, altered mental status.  Overall status has improved.    Assessment & Plan:   Principal Problem:   Acute CVA (cerebrovascular accident) [facility]) Active Problems:   Hyperlipidemia   Hypothyroidism   Chronic diastolic heart failure (HCC)   Atrial fibrillation (HCC)   AV heart block   Right leg weakness   Acute nonhemorrhagic CVA: Presented with slurred speech, right leg weakness.  MRI showed acute infarcts within the left cerebral hemisphere.  Stroke work-up initiated.  Neurology was following.  Stroke suspected to be embolic in etiology likely from A. fib with RVR and she was on subtherapeutic dose. Stroke work-up has been completed.  Eliquis dose has been increased to 5 mg twice daily.  Continue Lipitor.  Echo showed ejection fraction of 60-65%, no intracardiac source of emboli.  Hemoglobin A1c of 4.5.  TSH is normal.  LDL 48 PT/OT recommending skilled nursing facility on discharge. She should follow-up with [facility] neurology in 4 weeks.  Acute metabolic encephalopathy: Confused on presentation.At baseline, she is mostly alert and oriented.  She is from independent living facility.  Walks with help of walker/cane.  Acute encephalopathy was thought to be secondary to UTI/acute CVA/possible underlying dementia, delirium.  Continue monitoring mental status.  Delirium precautions.   CT  head follow-up done on 02/01/2019 did not show any acute intracranial abnormalities.  Continue supportive care.  She remains alert and awake, orientation is fluctuating. Family was concerned about her confusion.  I have explained several times that this could be from hospital-acquired delirium versus the stroke.  Started on low-dose Seroquel, melatonin at bedtime.  MRI did not show an acute intracranial abnormalities, showed recent stroke but not new.ammonia level is normal. Mental status has improved today and she is more alert and awake.  A. fib with RVR/sinus tachycardia with PVCs: Cardiology following.  Currently on metoprolol.  Continue monitoring on telemetry.  She has been tachycardic recently with heart rate in the range of 100-120.  EKG on 01/31/21 showed sinus tachycardia with PVCs/Pacs. EP following, currently on Lopressor.  EP considering pacemaker if needed but since her heart rate is currently well controlled in the range of 90s, the plan is held.  History of coronary artery disease: Status post CABG in 2013, has history of severe aortic stenosis status post tissue AVR in 2013.  Follows with cardiology.  Chronic diastolic congestive heart failure: Currently appears euvolemic.  She was also orthostatic and was started on gentle IV fluids, which has been stopped now.  UTI: Urine culture showed Klebsiella pneumoniae, on antibiotics for 5 days course.  Orthostatic hypotension: Started on gentle IV fluids,now stopped.  Cortisol unremarkable.  Continue to monitor  Hyponatremia: Could be from dehydration/poor oral intake and was given IV fluids.  Sodium levels stable in the range of 130s.  Continue to monitor  History of hemolytic anemia:On chronic  prednisone therapy.  Hypothyroidism: Synthyroid  Constipation: Continue aggressive bowel regimen.  Abdominal x-ray did not show  any obstruction but showed fecal impaction.  Disimpacted  Multiple skin breakdowns: Wound care  following.  Debility/deconditioning: PT/OT recommended  Skilled nursing facility on discharge.  Goals of care: Multiple comorbidities, advanced age.  Requested palliative care consult for goals of care discussion.  Plan is to continue full scope of treatment.             DVT prophylaxis:Eliquis Code Status: DNR Family Communication: Granddaughter daughter at bedside Status is: Inpatient  Remains inpatient appropriate because:Inpatient level of care appropriate due to severity of illness   Dispo: The patient is from: Home              Anticipated d/c is to: SNF              Anticipated d/c date is: 1 day              Patient currently is not medically stable to d/c.   Difficult to place patient No      Consultants: Cardiology, neurology  Procedures: None  Antimicrobials:  Anti-infectives (From admission, onward)   Start     Dose/Rate Route Frequency Ordered Stop   01/30/21 1100  cephALEXin (KEFLEX) capsule 500 mg        500 mg Oral Every 12 hours 01/30/21 1045 02/03/21 2138   01/27/21 1045  cephALEXin (KEFLEX) capsule 500 mg        500 mg Oral Once 01/27/21 1030 01/27/21 1216      Subjective:  Patient seen and examined the bedside this morning.  Hemodynamically stable.  She was sitting in the chair.  She looks more alert, awake and communicating more today.  Heart rate is better.   Objective: Vitals:   02/03/21 2140 02/03/21 2331 02/04/21 0348 02/04/21 0754  BP:  111/60 117/61 120/68  Pulse: (!) 101 72 80 89  Resp:  17 17 20   Temp:  98 F (36.7 C) 98 F (36.7 C) 98.2 F (36.8 C)  TempSrc:      SpO2:  96% 97% 94%  Weight:      Height:        Intake/Output Summary (Last 24 hours) at 02/04/2021 8850 Last data filed at 02/03/2021 1654 Gross per 24 hour  Intake -  Output 400 ml  Net -400 ml   Filed Weights   01/27/21 1810  Weight: 64.3 kg    Examination:  General exam: Very deconditioned and debilitated, elderly pleasant female Respiratory  system:  no wheezes or crackles  Cardiovascular system: Irregularly irregular rhythm ,no JVD, murmurs, rubs, gallops or clicks. Gastrointestinal system: Abdomen is nondistended, soft and nontender. No organomegaly or masses felt. Normal bowel sounds heard. Central nervous system: Alert and awake, oriented to place. Extremities: No edema, no clubbing ,no cyanosis Skin: Multiple skin breakdowns    Data Reviewed: I have personally reviewed following labs and imaging studies  CBC: Recent Labs  Lab 02/01/21 0337 02/02/21 0809 02/04/21 0518  WBC 11.3* 14.0* 10.5  NEUTROABS 8.6* 11.0* 7.6  HGB 12.0 10.7* 10.2*  HCT 37.1 33.4* 32.3*  MCV 87.1 88.4 88.0  PLT 252 234 277   Basic Metabolic Panel: Recent Labs  Lab 01/29/21 0202 01/30/21 0253 01/31/21 0140 01/31/21 1214 02/01/21 0337 02/02/21 0809 02/04/21 0518  NA 131* 130* 131*  --  132* 133* 134*  K 3.8 3.7 4.0  --  4.1 4.2 3.9  CL 98 98 99  --  99 100 102  CO2 25 23 22   --  22 23 23  GLUCOSE 108* 103* 111*  --  136* 119* 118*  BUN 20 23 24*  --  29* 41* 33*  CREATININE 1.00 1.02* 0.89  --  1.03* 1.04* 0.91  CALCIUM 8.8* 8.0* 8.5*  --  9.2 8.8* 8.7*  MG 2.1  --   --  2.0  --   --   --    GFR: Estimated Creatinine Clearance: 35.5 mL/min (by C-G formula based on SCr of 0.91 mg/dL). Liver Function Tests: No results for input(s): AST, ALT, ALKPHOS, BILITOT, PROT, ALBUMIN in the last 168 hours. No results for input(s): LIPASE, AMYLASE in the last 168 hours. Recent Labs  Lab 02/01/21 1317  AMMONIA 17   Coagulation Profile: No results for input(s): INR, PROTIME in the last 168 hours. Cardiac Enzymes: No results for input(s): CKTOTAL, CKMB, CKMBINDEX, TROPONINI in the last 168 hours. BNP (last 3 results) No results for input(s): PROBNP in the last 8760 hours. HbA1C: No results for input(s): HGBA1C in the last 72 hours. CBG: Recent Labs  Lab 02/03/21 1041  GLUCAP 135*   Lipid Profile: No results for input(s): CHOL,  HDL, LDLCALC, TRIG, CHOLHDL, LDLDIRECT in the last 72 hours. Thyroid Function Tests: No results for input(s): TSH, T4TOTAL, FREET4, T3FREE, THYROIDAB in the last 72 hours. Anemia Panel: No results for input(s): VITAMINB12, FOLATE, FERRITIN, TIBC, IRON, RETICCTPCT in the last 72 hours. Sepsis Labs: No results for input(s): PROCALCITON, LATICACIDVEN in the last 168 hours.  Recent Results (from the past 240 hour(s))  Urine culture     Status: Abnormal   Collection Time: 01/27/21  5:32 PM   Specimen: Urine, Random  Result Value Ref Range Status   Specimen Description URINE, RANDOM  Final   Special Requests   Final    NONE Performed at [facility], [address], [place], [place] [address]    Culture >=100,000 COLONIES/mL KLEBSIELLA PNEUMONIAE (A)  Final   Report Status 01/29/2021 FINAL  Final   Organism ID, Bacteria KLEBSIELLA PNEUMONIAE (A)  Final      Susceptibility   Klebsiella pneumoniae - MIC*    AMPICILLIN >=32 RESISTANT Resistant     CEFAZOLIN <=4 SENSITIVE Sensitive     CEFEPIME <=0.12 SENSITIVE Sensitive     CEFTRIAXONE <=0.25 SENSITIVE Sensitive     CIPROFLOXACIN <=0.25 SENSITIVE Sensitive     GENTAMICIN <=1 SENSITIVE Sensitive     IMIPENEM <=0.25 SENSITIVE Sensitive     NITROFURANTOIN 32 SENSITIVE Sensitive     TRIMETH/SULFA >=320 RESISTANT Resistant     AMPICILLIN/SULBACTAM >=32 RESISTANT Resistant     PIP/TAZO <=4 SENSITIVE Sensitive     * >=100,000 COLONIES/mL KLEBSIELLA PNEUMONIAE  SARS CORONAVIRUS 2 (TAT 6-24 HRS) Nasopharyngeal Nasopharyngeal Swab     Status: None   Collection Time: 01/28/21  9:58 AM   Specimen: Nasopharyngeal Swab  Result Value Ref Range Status   SARS Coronavirus 2 NEGATIVE NEGATIVE Final    Comment: (NOTE) SARS-CoV-2 target nucleic acids are NOT DETECTED.  The SARS-CoV-2 RNA is generally detectable in upper and lower respiratory specimens during the acute phase of infection. Negative results do not preclude SARS-CoV-2 infection, do  not rule out co-infections with other pathogens, and should not be used as the sole basis for treatment or other patient management decisions. Negative results must be combined with clinical observations, patient history, and epidemiological information. The expected result is Negative.  Fact Sheet for Patients: SugarRoll.be  Fact Sheet for Healthcare Providers: https://www.woods-mathews.com/  This test is not yet approved or cleared  by the [place] and  has been authorized for detection and/or diagnosis of SARS-CoV-2 by FDA under an Emergency Use Authorization (EUA). This EUA will remain  in effect (meaning this test can be used) for the duration of the COVID-19 declaration under Se ction 564(b)(1) of the Act, 21 U.S.C. section 360bbb-3(b)(1), unless the authorization is terminated or revoked sooner.  Performed at [facility], [place] [address], [place], [place] [address]   SARS Coronavirus 2 by RT PCR (hospital order, performed in [facility] hospital lab) Nasopharyngeal Nasopharyngeal Swab     Status: None   Collection Time: 01/30/21 11:10 AM   Specimen: Nasopharyngeal Swab  Result Value Ref Range Status   SARS Coronavirus 2 NEGATIVE NEGATIVE Final    Comment: (NOTE) SARS-CoV-2 target nucleic acids are NOT DETECTED.  The SARS-CoV-2 RNA is generally detectable in upper and lower respiratory specimens during the acute phase of infection. The lowest concentration of SARS-CoV-2 viral copies this assay can detect is 250 copies / mL. A negative result does not preclude SARS-CoV-2 infection and should not be used as the sole basis for treatment or other patient management decisions.  A negative result may occur with improper specimen collection / handling, submission of specimen other than nasopharyngeal swab, presence of viral mutation(s) within the areas targeted by this assay, and inadequate number of viral copies (<250  copies / mL). A negative result must be combined with clinical observations, patient history, and epidemiological information.  Fact Sheet for Patients:   StrictlyIdeas.no  Fact Sheet for Healthcare Providers: BankingDealers.co.za  This test is not yet approved or  cleared by the [place] FDA and has been authorized for detection and/or diagnosis of SARS-CoV-2 by FDA under an Emergency Use Authorization (EUA).  This EUA will remain in effect (meaning this test can be used) for the duration of the COVID-19 declaration under Section 564(b)(1) of the Act, 21 U.S.C. section 360bbb-3(b)(1), unless the authorization is terminated or revoked sooner.  Performed at [facility], [place] [address], [place], [place] [address]          Radiology Studies: MR BRAIN WO CONTRAST  Result Date: 02/03/2021 CLINICAL DATA:  Delirium.  Recent stroke. EXAM: MRI HEAD WITHOUT CONTRAST TECHNIQUE: Multiplanar, multiecho pulse sequences of the brain and surrounding structures were obtained without intravenous contrast. COMPARISON:  Head CT 02/01/2021 and MRI 01/27/2021 FINDINGS: Brain: Recent infarcts are again noted in the left corona radiata and left cerebellum. The left corona radiata infarct has slightly enlarged from the prior MRI while the cerebellar infarct is unchanged. No new infarct is identified elsewhere. Background patchy to confluent T2 hyperintensities in the cerebral white matter bilaterally are unchanged and nonspecific but compatible with moderate chronic small-vessel ischemic disease. Chronic bilateral cerebellar infarcts are again noted. There is moderate cerebral atrophy. Vascular: Major intracranial vascular flow voids are preserved. Skull and upper cervical spine: Unremarkable bone marrow signal. Sinuses/Orbits: Bilateral cataract extraction. Paranasal sinuses and mastoid air cells are clear. Other: None. IMPRESSION: 1. Slight enlargement  of the left corona radiata infarct since the 01/27/2021 MRI. 2. Unchanged subacute left cerebellar infarct. 3. Moderate chronic small-vessel ischemic disease. Electronically Signed   By: Logan Bores M.D.   On: 02/03/2021 13:04        Scheduled Meds: . apixaban  5 mg Oral BID  . atorvastatin  20 mg Oral QHS  . levothyroxine  88 mcg Oral QAC breakfast  . melatonin  3 mg Oral QHS  . metoprolol  tartrate  100 mg Oral Daily   And  . metoprolol tartrate  50 mg Oral QHS  . nystatin   Topical BID  . oxybutynin  5 mg Oral QODAY  . polyethylene glycol  17 g Oral BID  . predniSONE  5 mg Oral Q breakfast  . QUEtiapine  25 mg Oral QHS  . senna  2 tablet Oral Daily   Continuous Infusions:    LOS: 8 days    Time spent: 35 mins.More than 50% of that time was spent in counseling and/or coordination of care.      Shelly Coss, MD [facility] P2/11/2021, 8:12 AM

## 2021-02-04 NOTE — Progress Notes (Signed)
  Speech Language Pathology Treatment: Cognitive-Linquistic  Patient Details Name: Carol [name] MRN: [id number] DOB: Jul 18, 1928 Today's Date: 02/04/2021 Time: 1430-1450 SLP Time Calculation (min) (ACUTE ONLY): 20 min  Assessment / Plan / Recommendation Clinical Impression  Pt was asleep upon arrival but awakened briefly to voice and light touch.  Pt's daughter reports that pt has "perked up" since initiating IV fluids.  Pt was able to attend to and visually scan a calendar to locate the month, year, and date with max to total assist multimodal cues.  She needed mod-max assist multimodal cues to brush her tongue and gums but could replace her dentures after soaking them with set up assist.  Pt could repeat back basic information that was told to her after a brief delay with max assist multimodal cues.  Pt's granddaughter reports pt has been having significant difficulty with meals at this time due to impaired mastication and lethargy.  She reports that pt is only consuming pureed textures, thin liquids, and nutritional supplements due to prolonged and effortful mastication that is very tiring to the patient.  SLP paged the MD requesting orders for bedside swallow evaluation.  Pt was left in bed with granddaughter at bedside.  Continue per current plan of care.    HPI HPI: 87 y.o. female presenting to the emergency room complaints of right leg weakness and slurred speech that started yesterday afternoon sometime      SLP Plan  Continue with current plan of care       Recommendations                   Oral Care Recommendations: Oral care BID Follow up Recommendations: Skilled Nursing facility SLP Visit Diagnosis: Dysarthria and anarthria (R47.1);Attention and concentration deficit;Cognitive communication deficit (R41.841) Plan: Continue with current plan of care       GO                Carol [name], Carol [name] 02/04/2021, 3:02 PM

## 2021-02-04 NOTE — Progress Notes (Addendum)
MD notified about rash on pt's back reoccurring, and possible need for fecal disimpaction no new orders at this time, will continue to monitor

## 2021-02-04 NOTE — Progress Notes (Signed)
Patient very lethargic, BP 86/65, MD notified. Orders for fluid bolus and continuous IV fluids given. Patient became more alert after bolus and was able to get up to bedside commode. Will continue to monitor

## 2021-02-05 ENCOUNTER — Inpatient Hospital Stay (HOSPITAL_COMMUNITY): Payer: Medicare Other

## 2021-02-05 DIAGNOSIS — Z7189 Other specified counseling: Secondary | ICD-10-CM | POA: Diagnosis not present

## 2021-02-05 DIAGNOSIS — Z515 Encounter for palliative care: Secondary | ICD-10-CM | POA: Diagnosis not present

## 2021-02-05 DIAGNOSIS — I4891 Unspecified atrial fibrillation: Secondary | ICD-10-CM | POA: Diagnosis not present

## 2021-02-05 DIAGNOSIS — I4430 Unspecified atrioventricular block: Secondary | ICD-10-CM | POA: Diagnosis not present

## 2021-02-05 LAB — BASIC METABOLIC PANEL
Anion gap: 8 (ref 5–15)
BUN: 27 mg/dL — ABNORMAL HIGH (ref 8–23)
CO2: 26 mmol/L (ref 22–32)
Calcium: 8.5 mg/dL — ABNORMAL LOW (ref 8.9–10.3)
Chloride: 102 mmol/L (ref 98–111)
Creatinine, Ser: 0.84 mg/dL (ref 0.44–1.00)
GFR, Estimated: >60 mL/min (ref >60)
Glucose, Bld: 142 mg/dL — ABNORMAL HIGH (ref 70–99)
Potassium: 4 mmol/L (ref 3.5–5.1)
Sodium: 136 mmol/L (ref 135–145)

## 2021-02-05 MED ORDER — FLEET ENEMA 7-19 GM/118ML RE ENEM
1.0000 | ENEMA | Freq: Once | RECTAL | Status: AC
  Administered 2021-02-05: 1 via RECTAL
  Filled 2021-02-05: qty 1

## 2021-02-05 NOTE — Plan of Care (Signed)
  Problem: Health Behavior/Discharge Planning: Goal: Ability to manage health-related needs will improve Outcome: Progressing   Problem: Education: Goal: Knowledge of General Education information will improve Description: Including pain rating scale, medication(s)/side effects and non-pharmacologic comfort measures Outcome: Progressing

## 2021-02-05 NOTE — Evaluation (Signed)
Clinical/Bedside Swallow Evaluation Patient Details  Name: Carol [name] MRN: [id number] Date of Birth: 01/29/1928  Today's Date: 02/05/2021 Time: SLP Start Time (ACUTE ONLY): 0950 SLP Stop Time (ACUTE ONLY): 1020 SLP Time Calculation (min) (ACUTE ONLY): 30 min  Past Medical History:  Past Medical History:  Diagnosis Date  . Acute blood loss anemia 05/16/2018  . AIHA (autoimmune hemolytic anemia) (HCC)   . Allergic rhinitis due to pollen 01/18/2021  . Anxiety disorder 01/18/2021  . Aortic stenosis   . Atherosclerotic heart disease of native coronary artery without angina pectoris 01/18/2021  . Atrial fibrillation ([place])    Noted during recent cardiac surgery and treated with amiodarone   . Autoimmune hemolytic anemia (HCC)   . AV heart block 11/23/2020  . CAD (coronary artery disease) 12/26/2011   Severe ostial LM disease noted at cath 12/26/11   . Chronic diastolic heart failure ([facility]) 01/17/2012  . Complication of surgical procedure 01/18/2021  . Constipation 01/18/2021  . Coronary artery disease    Cath 1/13 left main  . 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 Dyspnea   . Elevated bilirubin 05/14/2018  . Essential hypertension 01/18/2021  . Hypercoagulable state ([place]) 01/18/2021  . Hyperlipidemia   . Hypertension   . Hypertensive heart disease without CHF   . Hyponatremia 01/17/2012  . Hypothyroidism   . Long term (current) use of anticoagulants 07/14/2013  . Lumbar degenerative disc disease    Prior lumbar laminectomy 12/2009 Dr. Rolena Infante.  Failed epidural steroids.  Currently on gabapentin   . Lumbar disc disease   . Lung nodules    Sees [facility], thought to be benign  . Memory problem 01/18/2021  . Mixed hyperlipidemia 01/18/2021  . Obesity   . Obstructive jaundice 05/14/2018  . Overactive bladder 01/18/2021  . PAF (paroxysmal atrial fibrillation) ([place]) 01/30/2019  . Paroxysmal atrial fibrillation ([place]) 01/30/2019  . Personal history of solitary pulmonary nodule  11/07/2011   Followed in Pulmonary clinic/ [facility]/ [facility]   - PET 11/19/11 indeterminant LUL nodule but slt reduced in size vs prev studies so rec f/u cxr in 2 months (tickle file) Wedge resection by Dr. Servando Snare at time of AVR/CABG 12/28/2011 >   wedge biopsy/resection, Left upper lobe - NON-NECROTIZING GRANULOMATOUS INFLAMMATION WITH ASSOCIATED MULTINUCLEATED GIANT CELLS.   . Polyp of colon 01/18/2021  . S/P AVR and CABG 12/28/2011   19 mm Magna Ease pericardial tissue valve CABG x 3 (LIMA-LAD, SVG-Int, SVG-dRCA)12/28/2011 Dr. Servando Snare    . Sciatica 01/18/2021  . Senile purpura ([place]) 01/18/2021  . Sepsis ([place]) 08/17/2018  . Spinal stenosis   . Villous adenoma of rectum    Past Surgical History:  Past Surgical History:  Procedure Laterality Date  . AORTIC VALVE REPLACEMENT  12/28/2011   Procedure: AORTIC VALVE REPLACEMENT (AVR);  Surgeon: Grace Isaac, MD;  Location: [facility];  Service: Open Heart Surgery;  Laterality: N/A;  . CARDIAC CATHETERIZATION  2013  . CHOLECYSTECTOMY    . CORONARY ARTERY BYPASS GRAFT  12/28/2011   Procedure: CORONARY ARTERY BYPASS GRAFTING (CABG);  Surgeon: Grace Isaac, MD;  Location: [facility];  Service: Open Heart Surgery;  Laterality: N/A;  . HERNIA REPAIR    . LEFT AND RIGHT HEART CATHETERIZATION WITH CORONARY ANGIOGRAM N/A 12/26/2011   Procedure: LEFT AND RIGHT HEART CATHETERIZATION WITH CORONARY ANGIOGRAM;  Surgeon: Jacolyn Reedy, MD;  Location: [facility] CATH LAB;  Service: Cardiovascular;  Laterality: N/A;  . LUMBAR LAMINECTOMY    . PARTIAL COLECTOMY  2008   Villous adenoma   HPI:  87 y.o. female presenting to the emergency room complaints of right leg weakness and slurred speech. MRI of the brain without contrast shows left cerebellar acute infarcts. Patient passed Gertie Fey however her daughter had concerns regarding patient's swallowing and mastication and BSE was ordered.   Assessment / Plan / Recommendation Clinical Impression  Patient presents with a mild  oropharyngeal dysphagia and suspected esophageal component (patient with some belching, delayed coughing and has been constipated with retention of gas). Per daughter, patient has taken Tums occasionally for hearburn but is not on any regularly scheduled PPI. Patient reportedly  has not been eating much and mainly is consuming soft solids and liquids. Patient demonstrated swift swallow with straw sips of thin liquids and did not start to exhibit any coughing  until after approximately 15 minutes. During this time, voice remained clear and coughing was mild and intermittent. With dentures in place, she took meds which were crushed/broken up in applesauce, exhibiting some delay in oral transit. She took two very small bites of banana and exhibited delayed mastication and oral transit. SLP discussed patient's PO diet with her daughter and recommending to keep her on regular solids, thin liquids but to order extra liquids, supplements and soft solids which will likely be where she can get her primary nutrition. SLP Visit Diagnosis: Dysphagia, unspecified (R13.10)    Aspiration Risk  Mild aspiration risk    Diet Recommendation Regular;Thin liquid   Liquid Administration via: Cup;Straw Medication Administration: Crushed with puree Supervision: Full supervision/cueing for compensatory strategies;Staff to assist with self feeding Compensations: Minimize environmental distractions;Slow rate;Small sips/bites Postural Changes: Seated upright at 90 degrees;Remain upright for at least 30 minutes after po intake    Other  Recommendations Oral Care Recommendations: Oral care BID   Follow up Recommendations Skilled Nursing facility      Frequency and Duration min 2x/week  1 week       Prognosis Prognosis for Safe Diet Advancement: Good      Swallow Study   General Date of Onset: 02/04/21 HPI: 87 y.o. female presenting to the emergency room complaints of right leg weakness and slurred speech. MRI of the  brain without contrast shows left cerebellar acute infarcts. Patient passed Gertie Fey however her daughter had concerns regarding patient's swallowing and mastication and BSE was ordered. Type of Study: Bedside Swallow Evaluation Previous Swallow Assessment: None Diet Prior to this Study: Regular;Thin liquids Temperature Spikes Noted: No Respiratory Status: Room air History of Recent Intubation: No Behavior/Cognition: Alert;Cooperative;Pleasant mood Oral Cavity Assessment: Within Functional Limits Oral Care Completed by SLP: No Oral Cavity - Dentition: Dentures, top;Dentures, bottom Vision: Functional for self-feeding Self-Feeding Abilities: Needs assist;Needs set up Patient Positioning: Upright in bed Baseline Vocal Quality: Normal Volitional Cough: Strong Volitional Swallow: Able to elicit    Oral/Motor/Sensory Function Overall Oral Motor/Sensory Function: Within functional limits   Ice Chips     Thin Liquid Thin Liquid: Impaired Presentation: Straw Pharyngeal  Phase Impairments: Cough - Delayed    Nectar Thick     Honey Thick     Puree Puree: Within functional limits   Solid     Solid: Impaired Oral Phase Impairments: Impaired mastication Oral Phase Functional Implications: Prolonged oral transit Other Comments: soft solid (banana)      Sonia Baller, MA, CCC-SLP Speech Therapy

## 2021-02-05 NOTE — Progress Notes (Signed)
PROGRESS NOTE    Carol [name]  POE:[id number] DOB: 01/23/1928 DOA: 01/27/2021 PCP: Lujean Amel, MD   Chief Complain: Right-sided weakness, slurred speech  Brief Narrative: Patient is a 87 year old female who presented to the emergency department with complaints of right leg weakness/slurred speech.  Work-up on presentation showed acute CVA.  Hospital course also remarkable for UTI, A. fib with RVR.  Cardiology and neurology were following.  Plan for skilled nursing facility placement when hemodynamically stable.  Hospital Course  remarkable for sinus tachycardia with PVCs/Afib with RVR , constipation, altered mental status.   Assessment & Plan:   Principal Problem:   Acute CVA (cerebrovascular accident) [facility]) Active Problems:   Hyperlipidemia   Hypothyroidism   Chronic diastolic heart failure (HCC)   Atrial fibrillation (HCC)   AV heart block   Right leg weakness   Acute nonhemorrhagic CVA: Presented with slurred speech, right leg weakness.  MRI showed acute infarcts within the left cerebral hemisphere.  Stroke work-up initiated.  Neurology was following.  Stroke suspected to be embolic in etiology likely from A. fib with RVR and she was on subtherapeutic dose. Stroke work-up has been completed.  Eliquis dose has been increased to 5 mg twice daily.  Continue Lipitor.  Echo showed ejection fraction of 60-65%, no intracardiac source of emboli.  Hemoglobin A1c of 4.5.  TSH is normal.  LDL 48 PT/OT recommending skilled nursing facility on discharge. She should follow-up with [facility] neurology in 4 weeks.  Acute metabolic encephalopathy: Confused on presentation.At baseline, she is mostly alert and oriented.  She is from independent living facility.  Walks with help of walker/cane.  Acute encephalopathy was thought to be secondary to UTI/acute CVA/possible underlying dementia, delirium.  Continue monitoring mental status.  Delirium precautions.   CT head follow-up done on 02/01/2019  did not show any acute intracranial abnormalities.   Family was concerned about her confusion.  I have explained several times that this could be from hospital-acquired delirium versus the stroke.  Started on low-dose Seroquel, melatonin at bedtime.  Seroquel will be discontinued due to sleepiness.  MRI did not show an acute intracranial abnormalities, showed recent stroke but not new.ammonia level is normal.  A. fib with RVR/sinus tachycardia with PVCs: Cardiology following.  Currently on metoprolol.  Continue monitoring on telemetry.  She has been tachycardic recently with heart rate in the range of 100-120.  EKG on 01/31/21 showed sinus tachycardia with PVCs/Pacs. EP following, currently on Lopressor.  EP considering pacemaker if needed but since her heart rate is currently well controlled in the range of 90s, the plan is held.  History of coronary artery disease: Status post CABG in 2013, has history of severe aortic stenosis status post tissue AVR in 2013.  Follows with cardiology.  Chronic diastolic congestive heart failure: Currently appears euvolemic.  She was also orthostatic and was started on gentle IV fluids, which has been stopped now.  UTI: Urine culture showed Klebsiella pneumoniae,completed  course of antibiotics for 5 days   Orthostatic hypotension: Started on gentle IV fluids,now stopped.  Cortisol unremarkable.  Continue to monitor  History of hemolytic anemia:On chronic  prednisone therapy.  Hypothyroidism: Synthyroid  Constipation: Continue aggressive bowel regimen.  Abdominal x-ray did not show any obstruction but showed fecal impaction.  Disimpacted  Multiple skin breakdowns: Wound care following.  Debility/deconditioning: PT/OT recommended  Skilled nursing facility on discharge.  Goals of care: Multiple comorbidities, advanced age.  Requested palliative care consult for goals of care discussion.  Plan  is to continue full scope of treatment.             DVT  prophylaxis:Eliquis Code Status: DNR Family Communication: Granddaughter daughter at bedside on 02/04/21 Status is: Inpatient  Remains inpatient appropriate because:Inpatient level of care appropriate due to severity of illness   Dispo: The patient is from: Home              Anticipated d/c is to: SNF              Anticipated d/c date is: 1 1-2 days              Patient currently is not medically stable to d/c.   Difficult to place patient No      Consultants: Cardiology, neurology  Procedures: None  Antimicrobials:  Anti-infectives (From admission, onward)   Start     Dose/Rate Route Frequency Ordered Stop   01/30/21 1100  cephALEXin (KEFLEX) capsule 500 mg        500 mg Oral Every 12 hours 01/30/21 1045 02/03/21 2138   01/27/21 1045  cephALEXin (KEFLEX) capsule 500 mg        500 mg Oral Once 01/27/21 1030 01/27/21 1216      Subjective:  Patient seen and examined the bedside this morning.  She was sleepy today.  When I called her name, she opened her eyes and recognized me as her doctor.  Her abdomen looks distended today and she was about to have a bowel movement.  Heart rate well controlled  Objective: Vitals:   02/04/21 2008 02/04/21 2225 02/04/21 2336 02/05/21 0448  BP: 138/63 (!) 152/70 128/66 (!) 142/104  Pulse: 97 97 100 (!) 108  Resp: 17  17 18   Temp: 97.6 F (36.4 C)  98.1 F (36.7 C) 98.1 F (36.7 C)  TempSrc: Oral  Oral Oral  SpO2: 96% 96% 95% 97%  Weight:      Height:        Intake/Output Summary (Last 24 hours) at 02/05/2021 0751 Last data filed at 02/05/2021 0600 Gross per 24 hour  Intake 2495 ml  Output 751 ml  Net 1744 ml   Filed Weights   01/27/21 1810  Weight: 64.3 kg    Examination:  General exam: Not in distress, sleepy, very elderly debilitated female Respiratory system: Bilateral equal air entry, normal vesicular breath sounds, no wheezes or crackles  Cardiovascular system: Irregularly irregular rhythm. No JVD, murmurs, rubs,  gallops or clicks. Gastrointestinal system: Abdomen is distended, soft and nontender. No organomegaly or masses felt.  Hyperactive bowel sounds heard. Central nervous system: Sleepy, awoke after calling her name Extremities: No edema, no clubbing ,no cyanosis Skin: Multiple skin breakdowns    Data Reviewed: I have personally reviewed following labs and imaging studies  CBC: Recent Labs  Lab 02/01/21 0337 02/02/21 0809 02/04/21 0518  WBC 11.3* 14.0* 10.5  NEUTROABS 8.6* 11.0* 7.6  HGB 12.0 10.7* 10.2*  HCT 37.1 33.4* 32.3*  MCV 87.1 88.4 88.0  PLT 252 234 675   Basic Metabolic Panel: Recent Labs  Lab 01/31/21 0140 01/31/21 1214 02/01/21 0337 02/02/21 0809 02/04/21 0518 02/05/21 0631  NA 131*  --  132* 133* 134* 136  K 4.0  --  4.1 4.2 3.9 4.0  CL 99  --  99 100 102 102  CO2 22  --  22 23 23 26   GLUCOSE 111*  --  136* 119* 118* 142*  BUN 24*  --  29* 41* 33* 27*  CREATININE  0.89  --  1.03* 1.04* 0.91 0.84  CALCIUM 8.5*  --  9.2 8.8* 8.7* 8.5*  MG  --  2.0  --   --   --   --    GFR: Estimated Creatinine Clearance: 38.4 mL/min (by C-G formula based on SCr of 0.84 mg/dL). Liver Function Tests: No results for input(s): AST, ALT, ALKPHOS, BILITOT, PROT, ALBUMIN in the last 168 hours. No results for input(s): LIPASE, AMYLASE in the last 168 hours. Recent Labs  Lab 02/01/21 1317  AMMONIA 17   Coagulation Profile: No results for input(s): INR, PROTIME in the last 168 hours. Cardiac Enzymes: No results for input(s): CKTOTAL, CKMB, CKMBINDEX, TROPONINI in the last 168 hours. BNP (last 3 results) No results for input(s): PROBNP in the last 8760 hours. HbA1C: No results for input(s): HGBA1C in the last 72 hours. CBG: Recent Labs  Lab 02/03/21 1041  GLUCAP 135*   Lipid Profile: No results for input(s): CHOL, HDL, LDLCALC, TRIG, CHOLHDL, LDLDIRECT in the last 72 hours. Thyroid Function Tests: No results for input(s): TSH, T4TOTAL, FREET4, T3FREE, THYROIDAB in the  last 72 hours. Anemia Panel: No results for input(s): VITAMINB12, FOLATE, FERRITIN, TIBC, IRON, RETICCTPCT in the last 72 hours. Sepsis Labs: No results for input(s): PROCALCITON, LATICACIDVEN in the last 168 hours.  Recent Results (from the past 240 hour(s))  Urine culture     Status: Abnormal   Collection Time: 01/27/21  5:32 PM   Specimen: Urine, Random  Result Value Ref Range Status   Specimen Description URINE, RANDOM  Final   Special Requests   Final    NONE Performed at [facility], [address], [place], [place] [address]    Culture >=100,000 COLONIES/mL KLEBSIELLA PNEUMONIAE (A)  Final   Report Status 01/29/2021 FINAL  Final   Organism ID, Bacteria KLEBSIELLA PNEUMONIAE (A)  Final      Susceptibility   Klebsiella pneumoniae - MIC*    AMPICILLIN >=32 RESISTANT Resistant     CEFAZOLIN <=4 SENSITIVE Sensitive     CEFEPIME <=0.12 SENSITIVE Sensitive     CEFTRIAXONE <=0.25 SENSITIVE Sensitive     CIPROFLOXACIN <=0.25 SENSITIVE Sensitive     GENTAMICIN <=1 SENSITIVE Sensitive     IMIPENEM <=0.25 SENSITIVE Sensitive     NITROFURANTOIN 32 SENSITIVE Sensitive     TRIMETH/SULFA >=320 RESISTANT Resistant     AMPICILLIN/SULBACTAM >=32 RESISTANT Resistant     PIP/TAZO <=4 SENSITIVE Sensitive     * >=100,000 COLONIES/mL KLEBSIELLA PNEUMONIAE  SARS CORONAVIRUS 2 (TAT 6-24 HRS) Nasopharyngeal Nasopharyngeal Swab     Status: None   Collection Time: 01/28/21  9:58 AM   Specimen: Nasopharyngeal Swab  Result Value Ref Range Status   SARS Coronavirus 2 NEGATIVE NEGATIVE Final    Comment: (NOTE) SARS-CoV-2 target nucleic acids are NOT DETECTED.  The SARS-CoV-2 RNA is generally detectable in upper and lower respiratory specimens during the acute phase of infection. Negative results do not preclude SARS-CoV-2 infection, do not rule out co-infections with other pathogens, and should not be used as the sole basis for treatment or other patient management decisions. Negative  results must be combined with clinical observations, patient history, and epidemiological information. The expected result is Negative.  Fact Sheet for Patients: SugarRoll.be  Fact Sheet for Healthcare Providers: https://www.woods-mathews.com/  This test is not yet approved or cleared by the [place] FDA and  has been authorized for detection and/or diagnosis of SARS-CoV-2 by FDA under an Emergency Use Authorization (EUA). This EUA  will remain  in effect (meaning this test can be used) for the duration of the COVID-19 declaration under Se ction 564(b)(1) of the Act, 21 U.S.C. section 360bbb-3(b)(1), unless the authorization is terminated or revoked sooner.  Performed at [facility], [place] [address] [place], [place] [address]   SARS Coronavirus 2 by RT PCR (hospital order, performed in [facility] hospital lab) Nasopharyngeal Nasopharyngeal Swab     Status: None   Collection Time: 01/30/21 11:10 AM   Specimen: Nasopharyngeal Swab  Result Value Ref Range Status   SARS Coronavirus 2 NEGATIVE NEGATIVE Final    Comment: (NOTE) SARS-CoV-2 target nucleic acids are NOT DETECTED.  The SARS-CoV-2 RNA is generally detectable in upper and lower respiratory specimens during the acute phase of infection. The lowest concentration of SARS-CoV-2 viral copies this assay can detect is 250 copies / mL. A negative result does not preclude SARS-CoV-2 infection and should not be used as the sole basis for treatment or other patient management decisions.  A negative result may occur with improper specimen collection / handling, submission of specimen other than nasopharyngeal swab, presence of viral mutation(s) within the areas targeted by this assay, and inadequate number of viral copies (<250 copies / mL). A negative result must be combined with clinical observations, patient history, and epidemiological information.  Fact Sheet for Patients:    StrictlyIdeas.no  Fact Sheet for Healthcare Providers: BankingDealers.co.za  This test is not yet approved or  cleared by the [place] FDA and has been authorized for detection and/or diagnosis of SARS-CoV-2 by FDA under an Emergency Use Authorization (EUA).  This EUA will remain in effect (meaning this test can be used) for the duration of the COVID-19 declaration under Section 564(b)(1) of the Act, 21 U.S.C. section 360bbb-3(b)(1), unless the authorization is terminated or revoked sooner.  Performed at [facility], [place] [address] [place], [place] [address]          Radiology Studies: MR BRAIN WO CONTRAST  Result Date: 02/03/2021 CLINICAL DATA:  Delirium.  Recent stroke. EXAM: MRI HEAD WITHOUT CONTRAST TECHNIQUE: Multiplanar, multiecho pulse sequences of the brain and surrounding structures were obtained without intravenous contrast. COMPARISON:  Head CT 02/01/2021 and MRI 01/27/2021 FINDINGS: Brain: Recent infarcts are again noted in the left corona radiata and left cerebellum. The left corona radiata infarct has slightly enlarged from the prior MRI while the cerebellar infarct is unchanged. No new infarct is identified elsewhere. Background patchy to confluent T2 hyperintensities in the cerebral white matter bilaterally are unchanged and nonspecific but compatible with moderate chronic small-vessel ischemic disease. Chronic bilateral cerebellar infarcts are again noted. There is moderate cerebral atrophy. Vascular: Major intracranial vascular flow voids are preserved. Skull and upper cervical spine: Unremarkable bone marrow signal. Sinuses/Orbits: Bilateral cataract extraction. Paranasal sinuses and mastoid air cells are clear. Other: None. IMPRESSION: 1. Slight enlargement of the left corona radiata infarct since the 01/27/2021 MRI. 2. Unchanged subacute left cerebellar infarct. 3. Moderate chronic small-vessel ischemic  disease. Electronically Signed   By: Logan Bores M.D.   On: 02/03/2021 13:04        Scheduled Meds: . apixaban  5 mg Oral BID  . atorvastatin  20 mg Oral QHS  . levothyroxine  88 mcg Oral QAC breakfast  . melatonin  3 mg Oral QHS  . metoprolol tartrate  100 mg Oral Daily   And  . metoprolol tartrate  50 mg Oral QHS  . nystatin   Topical BID  .  oxybutynin  5 mg Oral QODAY  . polyethylene glycol  17 g Oral BID  . predniSONE  5 mg Oral Q breakfast  . QUEtiapine  25 mg Oral QHS  . senna  2 tablet Oral Daily   Continuous Infusions:    LOS: 9 days    Time spent: 35 mins.More than 50% of that time was spent in counseling and/or coordination of care.      Shelly Coss, MD [facility] P2/13/2022, 7:51 AM

## 2021-02-05 NOTE — Plan of Care (Signed)
  Problem: Education: Goal: Knowledge of General Education information will improve Description: Including pain rating scale, medication(s)/side effects and non-pharmacologic comfort measures Outcome: Progressing   Problem: Health Behavior/Discharge Planning: Goal: Ability to manage health-related needs will improve Outcome: Progressing   Problem: Clinical Measurements: Goal: Ability to maintain clinical measurements within normal limits will improve Outcome: Progressing Goal: Will remain free from infection Outcome: Progressing Goal: Diagnostic test results will improve Outcome: Progressing Goal: Respiratory complications will improve Outcome: Progressing Goal: Cardiovascular complication will be avoided Outcome: Progressing   Problem: Coping: Goal: Level of anxiety will decrease Outcome: Progressing   Problem: Elimination: Goal: Will not experience complications related to bowel motility Outcome: Progressing Goal: Will not experience complications related to urinary retention Outcome: Progressing   Problem: Pain Managment: Goal: General experience of comfort will improve Outcome: Progressing   Problem: Safety: Goal: Ability to remain free from injury will improve Outcome: Progressing   Problem: Skin Integrity: Goal: Risk for impaired skin integrity will decrease Outcome: Progressing   Problem: Education: Goal: Knowledge of disease or condition will improve Outcome: Progressing Goal: Knowledge of secondary prevention will improve Outcome: Progressing Goal: Knowledge of patient specific risk factors addressed and post discharge goals established will improve Outcome: Progressing Goal: Individualized Educational Video(s) Outcome: Progressing   Problem: Coping: Goal: Will verbalize positive feelings about self Outcome: Progressing Goal: Will identify appropriate support needs Outcome: Progressing   Problem: Health Behavior/Discharge Planning: Goal: Ability to  manage health-related needs will improve Outcome: Progressing   Problem: Self-Care: Goal: Ability to participate in self-care as condition permits will improve Outcome: Progressing Goal: Verbalization of feelings and concerns over difficulty with self-care will improve Outcome: Progressing Goal: Ability to communicate needs accurately will improve Outcome: Progressing   Problem: Nutrition: Goal: Risk of aspiration will decrease Outcome: Progressing Goal: Dietary intake will improve Outcome: Progressing   Problem: Ischemic Stroke/TIA Tissue Perfusion: Goal: Complications of ischemic stroke/TIA will be minimized Outcome: Progressing

## 2021-02-05 NOTE — Progress Notes (Signed)
Palliative Medicine Inpatient Follow Up Note  Reason for consult:  Goals of Care "Admitted with stroke, very elderly,multiple comorbidities. If doesnt improve,needs to think about residential hospice"  HPI:  Per intake H&P --> Carol R Lucenteis a 87 y.o.femalewith a PMH of acute blood loss anemia, autoimmune hemolytic anemia, atrial fibrillation on anticoagulation, heart disease, chronic diastolic heart failure, essential hypertension, hypothyroidism. She was admitted on 2/4 for right leg weakness/slurred speech. She was found to have an acute nonhemorrhagic CVA. Also of note (+) UTI and Afib with RVR. Stroke has been suspected to be embolic in nature in the setting of subtherapeutic anticoagulation. Patient has been admitted for the past seven days and remains to show minimal improvements. Palliative care has been asked to discuss goals of care and potential residential hospice.  Today's Discussion (02/05/2021):  *Please note that this is a verbal dictation therefore any spelling or grammatical errors are due to the "[place]" system interpretation.  Chart reviewed.  I met with Richard and her daughter, [name] at bedside. Her daughter shares that her mother had just completed lunch and fallen back asleep. She tells me that Carol [name] has had a few loose bowel movements which required cleaning and this had cause her to be more tired. We reviewed the importance of Carol [name] getting out of bed regularly.   We discussed transition to [facility] rehabilitation. I shared with [name] that the hope is for Shivangi to make improvements in the oncoming days though it is quite possible that she continue to decline despite the best of efforts. I shared at that time communicating with the OP Palliative support team will be important to determine how to move forward.   [name] shares with me that she and her family have discussed and completed the MOST form as  below:  Cardiopulmonary Resuscitation: Do Not Attempt Resuscitation (DNR/No CPR)  Medical Interventions: Limited Additional Interventions: Use medical treatment, IV fluids and cardiac monitoring as indicated, DO NOT USE intubation or mechanical ventilation. May consider use of less invasive airway support such as BiPAP or CPAP. Also provide comfort measures. Transfer to the hospital if indicated. Avoid intensive care.   Antibiotics: Antibiotics if indicated  IV Fluids: IV fluids if indicated  Feeding Tube: No feeding tube   Questions and concerns addressed   Objective Assessment: Vital Signs Vitals:   02/05/21 0755 02/05/21 1224  BP: 119/70 121/65  Pulse: 89 90  Resp: 20   Temp: 98.1 F (36.7 C) 98 F (36.7 C)  SpO2: 100% 98%    Intake/Output Summary (Last 24 hours) at 02/05/2021 1357 Last data filed at 02/05/2021 0600 Gross per 24 hour  Intake 1995 ml  Output 751 ml  Net 1244 ml   Last Weight  Most recent update: 01/27/2021  6:38 PM   Weight  64.3 kg (141 lb 12.1 oz)           Gen:  Elderly Caucasian F in NAD HEENT: moist mucous membranes CV: Irregular rate and rhythm  PULM: clear to auscultation bilaterally  ABD: soft/nontender  EXT: (+) pedal edema, (+) ecchymosis on LE Neuro: Alert and oriented x2-3  SUMMARY OF RECOMMENDATIONS   DNAR/DNI  MOST Completed, paper copy placed onto the chart electric copy can be found in [facility]  DNR Form Completed, paper copy placed onto the chart electric copy can be found in [facility]  TOC - OP Palliative support  Spiritual Support  Goals include gaining strength and ultimately functional improvement  Time Spent: 35 Greater  than 50% of the time was spent in counseling and coordination of care ______________________________________________________________________________________ [place] Team Team Cell Phone: [phone] Please utilize secure chat with additional questions, if there is  no response within 30 minutes please call the above phone number  Palliative Medicine Team providers are available by phone from 7am to 7pm daily and can be reached through the team cell phone.  Should this patient require assistance outside of these hours, please call the patient's attending physician.

## 2021-02-05 NOTE — Progress Notes (Signed)
Telemetry called with EKG changes of 2nd/3rd deg HB, MD notified

## 2021-02-06 DIAGNOSIS — R509 Fever, unspecified: Secondary | ICD-10-CM | POA: Diagnosis not present

## 2021-02-06 DIAGNOSIS — R29898 Other symptoms and signs involving the musculoskeletal system: Secondary | ICD-10-CM | POA: Diagnosis not present

## 2021-02-06 DIAGNOSIS — Z7901 Long term (current) use of anticoagulants: Secondary | ICD-10-CM | POA: Diagnosis not present

## 2021-02-06 DIAGNOSIS — D62 Acute posthemorrhagic anemia: Secondary | ICD-10-CM | POA: Diagnosis not present

## 2021-02-06 DIAGNOSIS — M4800 Spinal stenosis, site unspecified: Secondary | ICD-10-CM | POA: Diagnosis not present

## 2021-02-06 DIAGNOSIS — I951 Orthostatic hypotension: Secondary | ICD-10-CM | POA: Diagnosis not present

## 2021-02-06 DIAGNOSIS — Z20822 Contact with and (suspected) exposure to covid-19: Secondary | ICD-10-CM | POA: Diagnosis present

## 2021-02-06 DIAGNOSIS — M5430 Sciatica, unspecified side: Secondary | ICD-10-CM | POA: Diagnosis not present

## 2021-02-06 DIAGNOSIS — Z87891 Personal history of nicotine dependence: Secondary | ICD-10-CM | POA: Diagnosis not present

## 2021-02-06 DIAGNOSIS — I459 Conduction disorder, unspecified: Secondary | ICD-10-CM | POA: Diagnosis present

## 2021-02-06 DIAGNOSIS — Z951 Presence of aortocoronary bypass graft: Secondary | ICD-10-CM | POA: Diagnosis not present

## 2021-02-06 DIAGNOSIS — R279 Unspecified lack of coordination: Secondary | ICD-10-CM | POA: Diagnosis not present

## 2021-02-06 DIAGNOSIS — F419 Anxiety disorder, unspecified: Secondary | ICD-10-CM | POA: Diagnosis not present

## 2021-02-06 DIAGNOSIS — R5383 Other fatigue: Secondary | ICD-10-CM | POA: Diagnosis not present

## 2021-02-06 DIAGNOSIS — R413 Other amnesia: Secondary | ICD-10-CM | POA: Diagnosis not present

## 2021-02-06 DIAGNOSIS — D6859 Other primary thrombophilia: Secondary | ICD-10-CM | POA: Diagnosis not present

## 2021-02-06 DIAGNOSIS — Z952 Presence of prosthetic heart valve: Secondary | ICD-10-CM | POA: Diagnosis not present

## 2021-02-06 DIAGNOSIS — Z7952 Long term (current) use of systemic steroids: Secondary | ICD-10-CM | POA: Diagnosis not present

## 2021-02-06 DIAGNOSIS — N3281 Overactive bladder: Secondary | ICD-10-CM | POA: Diagnosis not present

## 2021-02-06 DIAGNOSIS — R059 Cough, unspecified: Secondary | ICD-10-CM | POA: Diagnosis not present

## 2021-02-06 DIAGNOSIS — I959 Hypotension, unspecified: Secondary | ICD-10-CM | POA: Diagnosis not present

## 2021-02-06 DIAGNOSIS — Z8673 Personal history of transient ischemic attack (TIA), and cerebral infarction without residual deficits: Secondary | ICD-10-CM | POA: Diagnosis not present

## 2021-02-06 DIAGNOSIS — R7881 Bacteremia: Secondary | ICD-10-CM | POA: Diagnosis not present

## 2021-02-06 DIAGNOSIS — K921 Melena: Secondary | ICD-10-CM | POA: Diagnosis present

## 2021-02-06 DIAGNOSIS — F99 Mental disorder, not otherwise specified: Secondary | ICD-10-CM | POA: Diagnosis not present

## 2021-02-06 DIAGNOSIS — R5381 Other malaise: Secondary | ICD-10-CM | POA: Diagnosis not present

## 2021-02-06 DIAGNOSIS — I639 Cerebral infarction, unspecified: Secondary | ICD-10-CM | POA: Diagnosis not present

## 2021-02-06 DIAGNOSIS — A419 Sepsis, unspecified organism: Secondary | ICD-10-CM | POA: Diagnosis not present

## 2021-02-06 DIAGNOSIS — I4819 Other persistent atrial fibrillation: Secondary | ICD-10-CM | POA: Diagnosis not present

## 2021-02-06 DIAGNOSIS — R911 Solitary pulmonary nodule: Secondary | ICD-10-CM | POA: Diagnosis not present

## 2021-02-06 DIAGNOSIS — K635 Polyp of colon: Secondary | ICD-10-CM | POA: Diagnosis not present

## 2021-02-06 DIAGNOSIS — I5032 Chronic diastolic (congestive) heart failure: Secondary | ICD-10-CM | POA: Diagnosis present

## 2021-02-06 DIAGNOSIS — Z8249 Family history of ischemic heart disease and other diseases of the circulatory system: Secondary | ICD-10-CM | POA: Diagnosis not present

## 2021-02-06 DIAGNOSIS — I110 Hypertensive heart disease with heart failure: Secondary | ICD-10-CM | POA: Diagnosis present

## 2021-02-06 DIAGNOSIS — E782 Mixed hyperlipidemia: Secondary | ICD-10-CM | POA: Diagnosis present

## 2021-02-06 DIAGNOSIS — E871 Hypo-osmolality and hyponatremia: Secondary | ICD-10-CM | POA: Diagnosis not present

## 2021-02-06 DIAGNOSIS — E039 Hypothyroidism, unspecified: Secondary | ICD-10-CM | POA: Diagnosis present

## 2021-02-06 DIAGNOSIS — R000 Tachycardia, unspecified: Secondary | ICD-10-CM | POA: Diagnosis not present

## 2021-02-06 DIAGNOSIS — R0689 Other abnormalities of breathing: Secondary | ICD-10-CM | POA: Diagnosis not present

## 2021-02-06 DIAGNOSIS — I4891 Unspecified atrial fibrillation: Secondary | ICD-10-CM | POA: Diagnosis not present

## 2021-02-06 DIAGNOSIS — Z79899 Other long term (current) drug therapy: Secondary | ICD-10-CM | POA: Diagnosis not present

## 2021-02-06 DIAGNOSIS — I2510 Atherosclerotic heart disease of native coronary artery without angina pectoris: Secondary | ICD-10-CM | POA: Diagnosis present

## 2021-02-06 DIAGNOSIS — Z79890 Hormone replacement therapy (postmenopausal): Secondary | ICD-10-CM | POA: Diagnosis not present

## 2021-02-06 DIAGNOSIS — D591 Autoimmune hemolytic anemia, unspecified: Secondary | ICD-10-CM | POA: Diagnosis present

## 2021-02-06 DIAGNOSIS — A4102 Sepsis due to Methicillin resistant Staphylococcus aureus: Secondary | ICD-10-CM | POA: Diagnosis present

## 2021-02-06 DIAGNOSIS — Z743 Need for continuous supervision: Secondary | ICD-10-CM | POA: Diagnosis not present

## 2021-02-06 DIAGNOSIS — R0902 Hypoxemia: Secondary | ICD-10-CM | POA: Diagnosis not present

## 2021-02-06 DIAGNOSIS — R0603 Acute respiratory distress: Secondary | ICD-10-CM | POA: Diagnosis present

## 2021-02-06 DIAGNOSIS — Z807 Family history of other malignant neoplasms of lymphoid, hematopoietic and related tissues: Secondary | ICD-10-CM | POA: Diagnosis not present

## 2021-02-06 DIAGNOSIS — Z66 Do not resuscitate: Secondary | ICD-10-CM | POA: Diagnosis present

## 2021-02-06 DIAGNOSIS — I480 Paroxysmal atrial fibrillation: Secondary | ICD-10-CM | POA: Diagnosis present

## 2021-02-06 DIAGNOSIS — D72829 Elevated white blood cell count, unspecified: Secondary | ICD-10-CM | POA: Diagnosis not present

## 2021-02-06 DIAGNOSIS — Z803 Family history of malignant neoplasm of breast: Secondary | ICD-10-CM | POA: Diagnosis not present

## 2021-02-06 DIAGNOSIS — E785 Hyperlipidemia, unspecified: Secondary | ICD-10-CM | POA: Diagnosis not present

## 2021-02-06 DIAGNOSIS — R404 Transient alteration of awareness: Secondary | ICD-10-CM | POA: Diagnosis not present

## 2021-02-06 DIAGNOSIS — K5900 Constipation, unspecified: Secondary | ICD-10-CM | POA: Diagnosis not present

## 2021-02-06 DIAGNOSIS — J301 Allergic rhinitis due to pollen: Secondary | ICD-10-CM | POA: Diagnosis not present

## 2021-02-06 LAB — BASIC METABOLIC PANEL
Anion gap: 9 (ref 5–15)
BUN: 19 mg/dL (ref 8–23)
CO2: 26 mmol/L (ref 22–32)
Calcium: 8.6 mg/dL — ABNORMAL LOW (ref 8.9–10.3)
Chloride: 101 mmol/L (ref 98–111)
Creatinine, Ser: 0.75 mg/dL (ref 0.44–1.00)
GFR, Estimated: >60 mL/min (ref >60)
Glucose, Bld: 120 mg/dL — ABNORMAL HIGH (ref 70–99)
Potassium: 3.9 mmol/L (ref 3.5–5.1)
Sodium: 136 mmol/L (ref 135–145)

## 2021-02-06 LAB — CBC WITH DIFFERENTIAL/PLATELET
Abs Immature Granulocytes: 0.12 10*3/uL — ABNORMAL HIGH (ref 0.00–0.07)
Basophils Absolute: 0.1 10*3/uL (ref 0.0–0.1)
Basophils Relative: 1 %
Eosinophils Absolute: 0.2 10*3/uL (ref 0.0–0.5)
Eosinophils Relative: 2 %
HCT: 31.4 % — ABNORMAL LOW (ref 36.0–46.0)
Hemoglobin: 10.3 g/dL — ABNORMAL LOW (ref 12.0–15.0)
Immature Granulocytes: 1 %
Lymphocytes Relative: 18 %
Lymphs Abs: 1.8 10*3/uL (ref 0.7–4.0)
MCH: 28.5 pg (ref 26.0–34.0)
MCHC: 32.8 g/dL (ref 30.0–36.0)
MCV: 87 fL (ref 80.0–100.0)
Monocytes Absolute: 0.9 10*3/uL (ref 0.1–1.0)
Monocytes Relative: 9 %
Neutro Abs: 7.3 10*3/uL (ref 1.7–7.7)
Neutrophils Relative %: 69 %
Platelets: 269 10*3/uL (ref 150–400)
RBC: 3.61 MIL/uL — ABNORMAL LOW (ref 3.87–5.11)
RDW: 16.4 % — ABNORMAL HIGH (ref 11.5–15.5)
WBC: 10.3 10*3/uL (ref 4.0–10.5)
nRBC: 0 % (ref 0.0–0.2)

## 2021-02-06 LAB — RESP PANEL BY RT-PCR (FLU A&B, COVID) ARPGX2
Influenza A by PCR: NEGATIVE
Influenza B by PCR: NEGATIVE
SARS Coronavirus 2 by RT PCR: NEGATIVE

## 2021-02-06 MED ORDER — APIXABAN 5 MG PO TABS: 5.0000 mg | ORAL_TABLET | Freq: Two times a day (BID) | ORAL | Status: AC

## 2021-02-06 MED ORDER — METOPROLOL TARTRATE 100 MG PO TABS: 100.0000 mg | ORAL_TABLET | Freq: Every day | ORAL | Status: DC

## 2021-02-06 MED ORDER — SENNA 8.6 MG PO TABS: 2.0000 | ORAL_TABLET | Freq: Every day | ORAL | 0 refills | Status: AC

## 2021-02-06 MED ORDER — MELATONIN 3 MG PO TABS: 3.0000 mg | ORAL_TABLET | Freq: Every day | ORAL | 0 refills | Status: DC

## 2021-02-06 MED ORDER — METOPROLOL TARTRATE 50 MG PO TABS: 50.0000 mg | ORAL_TABLET | Freq: Every day | ORAL | Status: AC

## 2021-02-06 MED ORDER — NYSTATIN 100000 UNIT/GM EX POWD
Freq: Two times a day (BID) | CUTANEOUS | 0 refills | Status: AC
Start: 2021-02-06 — End: ?

## 2021-02-06 MED ORDER — POLYETHYLENE GLYCOL 3350 17 G PO PACK: 17.0000 g | PACK | Freq: Two times a day (BID) | ORAL | 0 refills | Status: AC

## 2021-02-06 NOTE — Progress Notes (Signed)
Physical Therapy Treatment Patient Details Name: Carol [name] MRN: [id number] DOB: 11/26/1928 Today's Date: 02/02/2021    History of Present Illness Pt is a 87 y.o. female who presents with R leg weakness and slurred speech. 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 PMH: acute blood loss anemia, autoimmune hemoyltic anemia, a-fib on anticoagulation, heart disease, chronic diastolic heart failure, spinal stenosis, sciatica, s/p AVR and CABG, dyspnea, CAD, aortic stenosis, HTN, and hypothyroidism.    PT Comments    Patient progressing slowly towards PT goals. Requires Mod A for bed mobility and Mod A of 2 for standing from EOB x2. Pt demonstrates flexed trunk/hips and needs max cues for upright posture. Incontinent of urine upon standing. Utilized stedy for transfer to chair. No pushing noted today and improved ability to maintain sitting balance EOB. Continues to demonstrate some right inattention but better able to attend to right side today with cues. Some mild dizziness once upright which resolved.  Pt continues to have cognitive deficits relating to memory, safety, awareness and problem solving. Continues to be appropriate for SNF. Will follow.   Follow Up Recommendations  SNF     Equipment Recommendations  Rolling walker with 5" wheels;3in1 (PT);Wheelchair (measurements PT);Wheelchair cushion (measurements PT);Hospital bed    Recommendations for Other Services       Precautions / Restrictions Precautions Precautions: Fall Precaution Comments: Rt inattention; pushes to R; monitor HR; orthostatic hypotension; HOH; impaired vision Restrictions Weight Bearing Restrictions: No    Mobility  Bed Mobility Overal bed mobility: Needs Assistance Bed Mobility: Supine to Sit     Supine to sit: Mod assist;HOB elevated     General bed mobility comments: Pt reaching to grab onto therapist's hand, assist  with trunk and scooting bottom to EOB. Increased time.    Transfers Overall transfer level: Needs assistance Equipment used: Rolling walker (2 wheeled);Ambulation equipment used Transfers: Sit to/from [facility] Sit to Stand: Mod assist;+2 physical assistance;From elevated surface Stand pivot transfers: Total assist       General transfer comment: Stood from EOB x2 with cues for hand placement/technique, flexed at hips/trunk and cues for upright. Incontinent of urine upon standing. Stood again with use of stedy with cues for hip extension and upright.  Ambulation/Gait             General Gait Details: Deferred   Stairs             Wheelchair Mobility    Modified Rankin (Stroke Patients Only) Modified Rankin (Stroke Patients Only) Pre-Morbid Rankin Score: Moderate disability Modified Rankin: Severe disability     Balance Overall balance assessment: Needs assistance Sitting-balance support: Feet supported;Single extremity supported Sitting balance-Leahy Scale: Poor Sitting balance - Comments: Requires at least 1 UE support sitting EOB, min guard for safety. No pushing noted today.   Standing balance support: During functional activity Standing balance-Leahy Scale: Poor Standing balance comment: Requires external support and UE support in standing, using RW vs stedy. Cues for upright, flexed at hips/trunk.                            Cognition Arousal/Alertness: Awake/alert Behavior During Therapy: WFL for tasks assessed/performed Overall Cognitive Status: Impaired/Different from baseline Area of Impairment: Orientation;Attention;Memory;Following commands;Safety/judgement;Awareness;Problem solving                 Orientation Level: Disoriented to;Time Current Attention Level: Selective Memory: Decreased recall  of precautions;Decreased short-term memory Following Commands: Follows one step commands  consistently Safety/Judgement: Decreased awareness of deficits Awareness: Emergent Problem Solving: Difficulty sequencing;Requires verbal cues;Requires tactile cues General Comments: Reports she got up to the bathroom this morning and walked in there despite not being able to walk. Aware she had a stroke and should be discharging to SNF today. Can follow simple commands, needs repetition at times due to poor STM.      Exercises      General Comments General comments (skin integrity, edema, etc.): Multiple skin tears covered in gauze on BLEs. Sensitive to touch.      Pertinent Vitals/Pain Pain Assessment: Faces Faces Pain Scale: Hurts little more Pain Location: BLEs with touch at times Pain Descriptors / Indicators: Grimacing;Discomfort;Tender Pain Intervention(s): Monitored during session;Repositioned    Home Living                      Prior Function            PT Goals (current goals can now be found in the care plan section) Progress towards PT goals: Progressing toward goals (slowly)    Frequency    Min 3X/week      PT Plan Current plan remains appropriate    Co-evaluation              AM-PAC PT "6 Clicks" Mobility   Outcome Measure  Help needed turning from your back to your side while in a flat bed without using bedrails?: A Lot Help needed moving from lying on your back to sitting on the side of a flat bed without using bedrails?: A Lot Help needed moving to and from a bed to a chair (including a wheelchair)?: Total Help needed standing up from a chair using your arms (e.g., wheelchair or bedside chair)?: A Lot Help needed to walk in hospital room?: Total Help needed climbing 3-5 steps with a railing? : Total 6 Click Score: 9    End of Session Equipment Utilized During Treatment: Gait belt Activity Tolerance: Patient limited by fatigue;Patient tolerated treatment well Patient left: in chair;with call bell/phone within reach;with chair  alarm set;with nursing/sitter in room Nurse Communication: Mobility status;Need for lift equipment (incontinence, stedy) PT Visit Diagnosis: Unsteadiness on feet (R26.81);Other abnormalities of gait and mobility (R26.89);Muscle weakness (generalized) (M62.81);Ataxic gait (R26.0);Difficulty in walking, not elsewhere classified (R26.2);Other symptoms and signs involving the nervous system (R29.898);Dizziness and giddiness (R42);Hemiplegia and hemiparesis Hemiplegia - Right/Left: Right Hemiplegia - dominant/non-dominant: Dominant Hemiplegia - caused by: Cerebral infarction     Time: 1001-1027 PT Time Calculation (min) (ACUTE ONLY): 26 min  Charges:  $Therapeutic Activity: 23-37 mins                     Marisa Severin, PT, DPT Acute Rehabilitation Services Pager [phone] Office [phone]       Marguarite Arbour A Sabra Heck 02/04/2021, 12:27 PM

## 2021-02-06 NOTE — TOC Transition Note (Signed)
Transition of Care [facility]) - CM/SW Discharge Note   Patient Details  Name: Carol [name] MRN: [id number] Date of Birth: 10-Apr-1928  Transition of [facility] CM/SW Contact:  Geralynn Ochs, LCSW Phone Number: 02/03/2021, 11:50 AM   Clinical Narrative:   Nurse to call report to [phone], Healthcare Room 32    Final next level of care: [place] Barriers to Discharge: Barriers Resolved   Patient Goals and CMS Choice Patient states their goals for this hospitalization and ongoing recovery are:: Pt and dtr agreeable to SNF at [facility]. CMS Medicare.gov Compare Post Acute Care list provided to:: Patient Choice offered to / list presented to : [name]  Discharge Placement              Patient chooses bed at: [facility] Patient to be transferred to facility by: [place] Name of family member notified: [name] Patient and family notified of of transfer: 02/20/2021  Discharge Plan and Services     Post Acute Care Choice: [place]                               Social Determinants of Health (SDOH) Interventions     Readmission Risk Interventions No flowsheet data found.

## 2021-02-06 NOTE — Discharge Summary (Signed)
Physician Discharge Summary  Carol [name] NOM:[id number] DOB: 1928-04-08 DOA: 01/27/2021  PCP: Lujean Amel, MD  Admit date: 01/27/2021 Discharge date: 02/16/2021  Admitted From:SNF Disposition:  SNF  Discharge Condition:Stable CODE STATUS: DNR Diet recommendation: Heart Healthy   Brief/Interim Summary: Patient is a 87 year old female who presented to the emergency department with complaints of right leg weakness/slurred speech.  Work-up on presentation showed acute CVA.  Hospital course also remarkable for UTI, A. fib with RVR.  Cardiology and neurology were following.  Plan for skilled nursing facility placement when hemodynamically stable.  Hospital Course  remarkable for sinus tachycardia with PVCs/Afib with RVR , constipation, altered mental status.  Currently she is hemodynamically stable.  Overall condition has improved.  She is medically stable for discharge to SNF today.  Following problems were addressed during her hospitalization:  Acute nonhemorrhagic CVA: Presented with slurred speech, right leg weakness.  MRI showed acute infarcts within the left cerebral hemisphere.  Stroke work-up initiated.  Neurology was following.  Stroke suspected to be embolic in etiology likely from A. fib with RVR and she was on subtherapeutic dose. Stroke work-up has been completed.  Eliquis dose has been increased to 5 mg twice daily.  Continue Lipitor.  Echo showed ejection fraction of 60-65%, no intracardiac source of emboli.  Hemoglobin A1c of 4.5.  TSH is normal.  LDL 48 PT/OT recommending skilled nursing facility on discharge. She should follow-up with [facility] neurology in 4 weeks.  Acute metabolic encephalopathy: Confused on presentation.At baseline, she is mostly alert and oriented.  She is from independent living facility.  Walks with help of walker/cane.  Acute encephalopathy was thought to be secondary to UTI/acute CVA/possible underlying dementia, delirium.  Continue monitoring mental  status.  Delirium precautions.   CT head follow-up done on 02/01/2019 did not show any acute intracranial abnormalities.   Family was concerned about her confusion.her confusion cud be from hospital-acquired delirium versus the stroke.  Started on melatonin at bedtime.   MRI did not show an acute intracranial abnormalities, showed recent stroke but not new.ammonia level is normal.  A. fib with RVR/sinus tachycardia with PVCs: Cardiology were following.  Currently on metoprolol.  Continue monitoring on telemetry.  She has been tachycardic recently with heart rate in the range of 100-120.  EKG on 01/31/21 showed sinus tachycardia with PVCs/Pacs. EP following, currently on Lopressor.  EP considering pacemaker if needed but since her heart rate is currently well controlled in the range of 90s, the plan is held.  History of coronary artery disease: Status post CABG in 2013, has history of severe aortic stenosis status post tissue AVR in 2013.  Follows with cardiology.  Chronic diastolic congestive heart failure: Currently appears euvolemic.  She was also orthostatic and was started on gentle IV fluids, which has been stopped now.  UTI: Urine culture showed Klebsiella pneumoniae,completed  course of antibiotics for 5 days   Orthostatic hypotension: Started on gentle IV fluids,now stopped.  Cortisol unremarkable.  Continue to monitor  History of hemolytic anemia:On chronic  prednisone therapy.  Hypothyroidism: Synthyroid  Constipation: Continue aggressive bowel regimen.  Abdominal x-ray did not show any obstruction but showed fecal impaction.  Disimpacted  Multiple skin breakdowns: Wound care were following.  Debility/deconditioning: PT/OT recommended  Skilled nursing facility on discharge.  Goals of care: Multiple comorbidities, advanced age.  Requested palliative care consult for goals of care discussion.    Outpatient follow-up recommended       Discharge Diagnoses:  Principal  Problem:  Acute CVA (cerebrovascular accident) [facility]) Active Problems:   Hyperlipidemia   Hypothyroidism   Chronic diastolic heart failure (HCC)   Atrial fibrillation (HCC)   AV heart block   Right leg weakness    Discharge Instructions  Discharge Instructions    Ambulatory referral to Neurology   Complete by: As directed    Follow up with Dr. Jannifer Franklin at [facility] in 4 weeks. She is Dr. Jannifer Franklin' patient. Thanks.   Diet - low sodium heart healthy   Complete by: As directed    Discharge instructions   Complete by: As directed    1)Please take prescribed medications as instructed 2) Do a CBC and BMP tests in a week. 3)Follow up with cardiology as an outpatient   Discharge wound care:   Complete by: As directed    As per wound care nurse   Increase activity slowly   Complete by: As directed      Allergies as of 01/31/2021      Reactions   Ibuprofen Other (See Comments)   Hx of GI bleed   Tapentadol Other (See Comments)   Nucynta- Knows she "cannot take"- made the patient "very sick"   Ace Inhibitors Cough   Angiotensin Receptor Blockers Cough   Dicloxacillin Nausea Only, Other (See Comments)   Made the patient lightheaded, also   Doxycycline Nausea Only   Hydrocodone Nausea Only, Other (See Comments)   Made the patient lightheaded, also   Nsaids    Can tolerate only Tylenol (has a history of GI BLEEDS)   Adhesive [tape] Rash   Blisters (can use only paper tape)   Latex Rash, Other (See Comments)   Paper tape only      Medication List    STOP taking these medications   ALPRAZolam 0.25 MG tablet Commonly known as: XANAX   losartan 100 MG tablet Commonly known as: COZAAR     TAKE these medications   apixaban 5 MG Tabs tablet Commonly known as: Eliquis Take 1 tablet (5 mg total) by mouth 2 (two) times daily. What changed:   medication strength  how much to take   atorvastatin 20 MG tablet Commonly known as: LIPITOR TAKE 1 TABLET BY MOUTH EVERYDAY AT  BEDTIME What changed: See the new instructions.   Besivance 0.6 % Susp Generic drug: Besifloxacin HCl Place 1 drop into the left eye See admin instructions. Place 1 drop left eye 4 times daily for 2 days after Avastin eye injections   CALCIUM 600 + D PO Take 1 tablet by mouth every morning.   cetirizine 10 MG tablet Commonly known as: ZYRTEC Take 10 mg by mouth daily in the afternoon.   diphenhydrAMINE 25 mg capsule Commonly known as: BENADRYL Take 25 mg by mouth 2 (two) times daily as needed (or allergic reactions).   docusate sodium 100 MG capsule Commonly known as: COLACE Take 100 mg by mouth daily in the afternoon.   gabapentin 100 MG capsule Commonly known as: NEURONTIN Take 100 mg by mouth 3 (three) times daily.   levothyroxine 88 MCG tablet Commonly known as: SYNTHROID Take 88 mcg by mouth daily before breakfast.   melatonin 3 MG Tabs tablet Take 1 tablet (3 mg total) by mouth at bedtime.   metoprolol tartrate 50 MG tablet Commonly known as: LOPRESSOR Take 1 tablet (50 mg total) by mouth at bedtime. What changed: You were already taking a medication with the same name, and this prescription was added. Make sure you understand how and when to  take each.   metoprolol tartrate 100 MG tablet Commonly known as: LOPRESSOR Take 1 tablet (100 mg total) by mouth daily. Start taking on: February 07, 2021 What changed:   medication strength  how much to take  when to take this   Multi For Her 50+ Tabs Take 1 tablet by mouth daily in the afternoon.   nystatin powder Commonly known as: MYCOSTATIN/NYSTOP Apply topically 2 (two) times daily.   oxybutynin 5 MG tablet Commonly known as: DITROPAN Take 5 mg by mouth every other day.   polyethylene glycol 17 g packet Commonly known as: MIRALAX / GLYCOLAX Take 17 g by mouth 2 (two) times daily. What changed:   when to take this  reasons to take this   predniSONE 5 MG tablet Commonly known as: DELTASONE Take 5  mg by mouth daily with breakfast.   PROBIOTIC PO Take 1 capsule by mouth daily.   Santyl ointment Generic drug: collagenase Apply 1 application topically See admin instructions. Apply daily as directed to the affected toe   senna 8.6 MG Tabs tablet Commonly known as: SENOKOT Take 2 tablets (17.2 mg total) by mouth daily. Start taking on: February 07, 2021            Discharge Care Instructions  (From admission, onward)         Start     Ordered   01/28/2021 0000  Discharge wound care:       Comments: As per wound care nurse   02/05/2021 1058          Contact information for follow-up providers    Schedule an appointment as soon as possible for a visit  with Lujean Amel, MD.   Specialty: Family Medicine Contact information: [address] [place] [address] [phone]        Kathrynn Ducking, MD. Schedule an appointment as soon as possible for a visit in 4 week(s).   Specialty: Neurology Contact information: [address] [place] [address] [phone]            Contact information for after-discharge care    Destination    [facility] .   Service: Skilled Nursing Contact information: [address] [address] [phone]                 Allergies  Allergen Reactions  . Ibuprofen Other (See Comments)    Hx of GI bleed  . Tapentadol Other (See Comments)    Nucynta- Knows she "cannot take"- made the patient "very sick"   . Ace Inhibitors Cough  . Angiotensin Receptor Blockers Cough  . Dicloxacillin Nausea Only and Other (See Comments)    Made the patient lightheaded, also  . Doxycycline Nausea Only  . Hydrocodone Nausea Only and Other (See Comments)    Made the patient lightheaded, also  . Nsaids     Can tolerate only Tylenol (has a history of GI BLEEDS)  . Adhesive [Tape] Rash    Blisters (can use only paper tape)  . Latex Rash and Other (See  Comments)    Paper tape only    Consultations:  Palliative care, Cardiology   Procedures/Studies: CT ANGIO HEAD W OR WO CONTRAST  Result Date: 01/28/2021 CLINICAL DATA:  Stroke. TIA. Left cerebellar and basal ganglia strokes. EXAM: CT ANGIOGRAPHY HEAD AND NECK TECHNIQUE: Multidetector CT imaging of the head and neck was performed using the standard protocol during bolus administration of intravenous  contrast. Multiplanar CT image reconstructions and MIPs were obtained to evaluate the vascular anatomy. Carotid stenosis measurements (when applicable) are obtained utilizing NASCET criteria, using the distal internal carotid diameter as the denominator. CONTRAST:  82mL OMNIPAQUE IOHEXOL 350 MG/ML SOLN COMPARISON:  CT and MRI studies done yesterday. FINDINGS: CT HEAD FINDINGS Brain: Focal low-density now evident in the left cerebellum. No evidence of hemorrhagic transformation. Cerebral hemispheres show a background pattern of extensive chronic small vessel disease of the white matter. The acute infarction in the left basal ganglia and radiating white matter tracts is barely discernible. No hemorrhagic transformation. No new insult. No hydrocephalus or extra-axial collection. Vascular: There is atherosclerotic calcification of the major vessels at the base of the brain. Skull: Negative Sinuses: Clear Orbits: Normal Review of the MIP images confirms the above findings CTA NECK FINDINGS Aortic arch: Aortic atherosclerotic calcification. Branching pattern is normal without flow limiting stenosis. Right carotid system: Common carotid artery shows atherosclerotic plaque but without flow limiting stenosis proximal to the bifurcation. Dense calcified plaque at the carotid bifurcation and ICA bulb. Minimal diameter in the ICA bulb is 1.5 mm. Compared to a more distal cervical ICA of 5 mm, this indicates a 70% stenosis. Left carotid system: Common carotid artery shows scattered plaque but is sufficiently patent to the  bifurcation region. Dense calcified plaque at the carotid bifurcation and ICA bulb. Minimal diameter in the ICA bulb is 2.5 mm. Compared to a more distal cervical ICA diameter of 5 mm, this indicates a 50% stenosis. Vertebral arteries: Both subclavian arteries show atherosclerotic disease but without a flow limiting stenosis. Both vertebral arteries are patent with stenosis of 50% at their origins. Beyond that, the vessels are patent through the cervical region to the foramen magnum. Skeleton: Ordinary cervical spondylosis. Chronic arthropathy at the C1-2 articulation. Other neck: No mass or lymphadenopathy in the neck. Upper chest: Mild scarring at the lung apices.  No active process. Review of the MIP images confirms the above findings CTA HEAD FINDINGS Anterior circulation: Both internal carotid arteries are patent through the skull base and siphon regions. There is calcification in the carotid siphon regions with stenosis estimated at 30% on both sides. The anterior and middle cerebral vessels are patent without proximal stenosis, aneurysm or vascular malformation. No large or medium vessel occlusion identified. Posterior circulation: Both vertebral arteries are patent through the foramen magnum to the basilar. Focal plaque in the left vertebral V4 segment with stenosis of 30%. No basilar stenosis. Major posterior circulation branch vessels are patent. Left PCA receives its supply from the anterior circulation. Venous sinuses: Patent and normal. Anatomic variants: None Review of the MIP images confirms the above findings IMPRESSION: 1. Dense calcified plaque at both carotid bifurcations and ICA bulb regions. 70% stenosis of the ICA bulb on the right. 50% stenosis of the ICA bulb on the left. 2. 50% stenoses at both vertebral artery origins. 30% stenosis of the left vertebral V4 segment. 3. No intracranial large or medium vessel occlusion or correctable proximal stenosis. Aortic Atherosclerosis (ICD10-I70.0).  Electronically Signed   By: Nelson Chimes M.D.   On: 01/28/2021 19:56   DG Abd 1 View  Result Date: 02/05/2021 CLINICAL DATA:  Abdominal distension EXAM: ABDOMEN - 1 VIEW COMPARISON:  February 01, 2021 FINDINGS: No appreciable bowel dilatation or air-fluid level to suggest bowel obstruction. No free air. Lung bases clear. Postoperative changes noted in the upper abdomen. There is aortic and splenic artery atherosclerotic calcification. IMPRESSION: No bowel obstruction or free  air. Lung bases clear. Aortic Atherosclerosis (ICD10-I70.0). Electronically Signed   By: Lowella Grip III M.D.   On: 02/05/2021 09:00   DG Abd 1 View  Result Date: 02/01/2021 CLINICAL DATA:  Abdominal pain and distension EXAM: ABDOMEN - 1 VIEW COMPARISON:  CT abdomen 05/14/2018 FINDINGS: Small bowel pattern is unremarkable. Large amount of gas and stool throughout the colon. Peripheral gas pattern in the rectum presumed to be peripheral gas around impacted stool. The unlikely possibility pneumatosis is not excluded. Arterial vascular calcification is noted throughout. Clips in the right upper quadrant consistent with previous cholecystectomy. Scoliosis and degenerative changes of the spine. IMPRESSION: Large amount of gas and stool throughout the colon. Peripheral gas pattern in the rectum presumed to be peripheral gas around impacted stool. The unlikely possibility of pneumatosis is not excluded. If there is clinical concern regarding the possibility bowel infarction, consider CT of the abdomen pelvis. Otherwise, the findings could simply be due to constipation. Electronically Signed   By: Nelson Chimes M.D.   On: 02/01/2021 19:26   CT HEAD WO CONTRAST  Result Date: 02/01/2021 CLINICAL DATA:  Delirium. EXAM: CT HEAD WITHOUT CONTRAST TECHNIQUE: Contiguous axial images were obtained from the base of the skull through the vertex without intravenous contrast. COMPARISON:  Noncontrast head CT 01/27/2021. Brain MRI 01/27/2021. CT  angiogram head/neck 01/28/2021. FINDINGS: Brain: Moderate cerebral and cerebellar atrophy. Known early subacute infarct within the left corona radiata/basal ganglia. Background moderate multifocal T2/FLAIR hyperintensity within the cerebral white matter is nonspecific compatible with chronic small vessel ischemic disease. Redemonstrated small early subacute infarct within the left cerebellar hemisphere. Superimposed small chronic infarcts within the bilateral cerebellar hemispheres. There is no acute intracranial hemorrhage. No extra-axial fluid collection. No evidence of intracranial mass. No midline shift. Vascular: No hyperdense vessel.  Atherosclerotic calcifications. Skull: Normal. Negative for fracture or focal lesion. Sinuses/Orbits: Visualized orbits show no acute finding. Trace ethmoid sinus mucosal thickening. IMPRESSION: No evidence of interval acute intracranial abnormality. Known subacute infarcts within the left corona radiata/basal ganglia and left cerebellar hemisphere. Stable background moderate generalized atrophy of the brain and chronic small vessel ischemic disease. Unchanged small remote infarcts within the bilateral cerebellar hemispheres. Electronically Signed   By: Kellie Simmering DO   On: 02/01/2021 14:59   CT HEAD WO CONTRAST  Result Date: 01/27/2021 CLINICAL DATA:  Right-sided weakness with slurred speech EXAM: CT HEAD WITHOUT CONTRAST TECHNIQUE: Contiguous axial images were obtained from the base of the skull through the vertex without intravenous contrast. COMPARISON:  02/19/2017 FINDINGS: Brain: No evidence of acute gray matter infarction, hemorrhage, hydrocephalus, extra-axial collection or mass lesion/mass effect. Confluent chronic small vessel ischemia in the deep cerebral white matter. Increased number of chronic appearing bilateral cerebellar infarcts. Age congruent brain volume Vascular: No hyperdense vessel or unexpected calcification. Skull: Normal. Negative for fracture or  focal lesion. Sinuses/Orbits: Bilateral cataract resection. IMPRESSION: 1. No acute finding. 2. Extensive chronic small vessel disease. Electronically Signed   By: Monte Fantasia M.D.   On: 01/27/2021 07:23   CT ANGIO NECK W OR WO CONTRAST  Result Date: 01/28/2021 CLINICAL DATA:  Stroke. TIA. Left cerebellar and basal ganglia strokes. EXAM: CT ANGIOGRAPHY HEAD AND NECK TECHNIQUE: Multidetector CT imaging of the head and neck was performed using the standard protocol during bolus administration of intravenous contrast. Multiplanar CT image reconstructions and MIPs were obtained to evaluate the vascular anatomy. Carotid stenosis measurements (when applicable) are obtained utilizing NASCET criteria, using the distal internal carotid diameter as the denominator.  CONTRAST:  28mL OMNIPAQUE IOHEXOL 350 MG/ML SOLN COMPARISON:  CT and MRI studies done yesterday. FINDINGS: CT HEAD FINDINGS Brain: Focal low-density now evident in the left cerebellum. No evidence of hemorrhagic transformation. Cerebral hemispheres show a background pattern of extensive chronic small vessel disease of the white matter. The acute infarction in the left basal ganglia and radiating white matter tracts is barely discernible. No hemorrhagic transformation. No new insult. No hydrocephalus or extra-axial collection. Vascular: There is atherosclerotic calcification of the major vessels at the base of the brain. Skull: Negative Sinuses: Clear Orbits: Normal Review of the MIP images confirms the above findings CTA NECK FINDINGS Aortic arch: Aortic atherosclerotic calcification. Branching pattern is normal without flow limiting stenosis. Right carotid system: Common carotid artery shows atherosclerotic plaque but without flow limiting stenosis proximal to the bifurcation. Dense calcified plaque at the carotid bifurcation and ICA bulb. Minimal diameter in the ICA bulb is 1.5 mm. Compared to a more distal cervical ICA of 5 mm, this indicates a 70%  stenosis. Left carotid system: Common carotid artery shows scattered plaque but is sufficiently patent to the bifurcation region. Dense calcified plaque at the carotid bifurcation and ICA bulb. Minimal diameter in the ICA bulb is 2.5 mm. Compared to a more distal cervical ICA diameter of 5 mm, this indicates a 50% stenosis. Vertebral arteries: Both subclavian arteries show atherosclerotic disease but without a flow limiting stenosis. Both vertebral arteries are patent with stenosis of 50% at their origins. Beyond that, the vessels are patent through the cervical region to the foramen magnum. Skeleton: Ordinary cervical spondylosis. Chronic arthropathy at the C1-2 articulation. Other neck: No mass or lymphadenopathy in the neck. Upper chest: Mild scarring at the lung apices.  No active process. Review of the MIP images confirms the above findings CTA HEAD FINDINGS Anterior circulation: Both internal carotid arteries are patent through the skull base and siphon regions. There is calcification in the carotid siphon regions with stenosis estimated at 30% on both sides. The anterior and middle cerebral vessels are patent without proximal stenosis, aneurysm or vascular malformation. No large or medium vessel occlusion identified. Posterior circulation: Both vertebral arteries are patent through the foramen magnum to the basilar. Focal plaque in the left vertebral V4 segment with stenosis of 30%. No basilar stenosis. Major posterior circulation branch vessels are patent. Left PCA receives its supply from the anterior circulation. Venous sinuses: Patent and normal. Anatomic variants: None Review of the MIP images confirms the above findings IMPRESSION: 1. Dense calcified plaque at both carotid bifurcations and ICA bulb regions. 70% stenosis of the ICA bulb on the right. 50% stenosis of the ICA bulb on the left. 2. 50% stenoses at both vertebral artery origins. 30% stenosis of the left vertebral V4 segment. 3. No  intracranial large or medium vessel occlusion or correctable proximal stenosis. Aortic Atherosclerosis (ICD10-I70.0). Electronically Signed   By: Nelson Chimes M.D.   On: 01/28/2021 19:56   MR BRAIN WO CONTRAST  Result Date: 02/03/2021 CLINICAL DATA:  Delirium.  Recent stroke. EXAM: MRI HEAD WITHOUT CONTRAST TECHNIQUE: Multiplanar, multiecho pulse sequences of the brain and surrounding structures were obtained without intravenous contrast. COMPARISON:  Head CT 02/01/2021 and MRI 01/27/2021 FINDINGS: Brain: Recent infarcts are again noted in the left corona radiata and left cerebellum. The left corona radiata infarct has slightly enlarged from the prior MRI while the cerebellar infarct is unchanged. No new infarct is identified elsewhere. Background patchy to confluent T2 hyperintensities in the cerebral white matter bilaterally  are unchanged and nonspecific but compatible with moderate chronic small-vessel ischemic disease. Chronic bilateral cerebellar infarcts are again noted. There is moderate cerebral atrophy. Vascular: Major intracranial vascular flow voids are preserved. Skull and upper cervical spine: Unremarkable bone marrow signal. Sinuses/Orbits: Bilateral cataract extraction. Paranasal sinuses and mastoid air cells are clear. Other: None. IMPRESSION: 1. Slight enlargement of the left corona radiata infarct since the 01/27/2021 MRI. 2. Unchanged subacute left cerebellar infarct. 3. Moderate chronic small-vessel ischemic disease. Electronically Signed   By: Logan Bores M.D.   On: 02/03/2021 13:04   MR BRAIN WO CONTRAST  Addendum Date: 01/27/2021   ADDENDUM REPORT: 01/27/2021 12:00 ADDENDUM: These results were called by telephone at the time of interpretation on 01/27/2021 at 12:00 pm to provider JOSHUA ZAVITZ , who verbally acknowledged these results. Electronically Signed   By: Pedro Earls M.D.   On: 01/27/2021 12:00   Result Date: 01/27/2021 CLINICAL DATA:  Stroke suspected. EXAM:  MRI HEAD WITHOUT CONTRAST TECHNIQUE: Multiplanar, multiecho pulse sequences of the brain and surrounding structures were obtained without intravenous contrast. COMPARISON:  Head CT January 27, 2021. FINDINGS: Brain: Areas of restricted diffusion within the left cerebellar hemisphere and left caudate body extending into the lenticulocapsular region, consistent with acute infarct. No hemorrhage, hydrocephalus, extra-axial collection or mass lesion. Scattered and confluent foci of T2 hyperintensity are seen within the white matter of the cerebral hemispheres, nonspecific, most likely related to chronic microvascular ischemic changes. Remote lacunar infarcts in the bilateral cerebellar hemispheres. Vascular: Normal flow voids. Skull and upper cervical spine: Normal marrow signal. Sinuses/Orbits: Bilateral lens surgery. Paranasal sinuses are clear. IMPRESSION: 1. Areas of restricted diffusion within the left cerebellar hemisphere and left caudate body extending into the lenticulocapsular region, consistent with acute infarcts. 2. Remote lacunar infarcts in the bilateral cerebellar hemispheres. 3. Moderate chronic microvascular ischemic changes. Electronically Signed: By: Pedro Earls M.D. On: 01/27/2021 11:54   ECHOCARDIOGRAM COMPLETE  Result Date: 01/29/2021    ECHOCARDIOGRAM REPORT   Patient Name:   Carol [name] Date of Exam: 01/29/2021 Medical Rec #:  [id number]           Height:       62.0 in Accession #:    [id number]          Weight:       141.8 lb Date of Birth:  1928/10/16            BSA:          1.652 m Patient Age:    18 years            BP:           161/92 mmHg Patient Gender: F                   HR:           116 bpm. Exam Location:  Inpatient Procedure: 2D Echo Indications:    Stroke 434.91 / I163.9  History:        Patient has prior history of Echocardiogram examinations, most                 recent 01/17/2012. CAD, Prior CABG, Stroke, Arrythmias:Atrial                 Fibrillation;  Risk Factors:Dyslipidemia, Former Smoker and                 Hypertension. Sepsis, 19 mm Magna Ease pericardial tissue valve  12/28/2011.                 Aortic Valve: 19 mm Edwards MagnaEase valve is present in the                 aortic position.  Sonographer:    Leavy Cella Referring Phys: [place] Comments: Restricted Mobility. IMPRESSIONS  1. Left ventricular ejection fraction, by estimation, is 60 to 65%. The left ventricle has normal function. The left ventricle has no regional wall motion abnormalities. Left ventricular diastolic function could not be evaluated.  2. Right ventricular systolic function is normal. The right ventricular size is normal. There is normal pulmonary artery systolic pressure.  3. The mitral valve is abnormal. Trivial mitral valve regurgitation. Moderate mitral annular calcification.  4. The aortic valve has been repaired/replaced. Aortic valve regurgitation is trivial. There is a 19 mm Edwards MagnaEase valve present in the aortic position. Echo findings are consistent with normal structure and function of the aortic valve prosthesis. Aortic valve mean gradient measures 14.3 mmHg. Aortic valve Vmax measures 2.44 m/s.  5. The inferior vena cava is normal in size with greater than 50% respiratory variability, suggesting right atrial pressure of 3 mmHg. Comparison(s): Prior images unable to be directly viewed, comparison made by report only. Changes from prior study are noted. 01/17/12: LVEF 55-60%, aortic bioprosthesis -mean gradient 22 mmHg. FINDINGS  Left Ventricle: Left ventricular ejection fraction, by estimation, is 60 to 65%. The left ventricle has normal function. The left ventricle has no regional wall motion abnormalities. The left ventricular internal cavity size was normal in size. There is  no left ventricular hypertrophy. Left ventricular diastolic function could not be evaluated due to atrial fibrillation. Left ventricular  diastolic function could not be evaluated. Right Ventricle: The right ventricular size is normal. No increase in right ventricular wall thickness. Right ventricular systolic function is normal. There is normal pulmonary artery systolic pressure. The tricuspid regurgitant velocity is 2.43 m/s, and  with an assumed right atrial pressure of 3 mmHg, the estimated right ventricular systolic pressure is 95.6 mmHg. Left Atrium: Left atrial size was normal in size. Right Atrium: Right atrial size was normal in size. Pericardium: There is no evidence of pericardial effusion. Mitral Valve: The mitral valve is abnormal. There is mild thickening of the mitral valve leaflet(s). Moderate mitral annular calcification. Trivial mitral valve regurgitation. Tricuspid Valve: The tricuspid valve is grossly normal. Tricuspid valve regurgitation is trivial. Aortic Valve: The aortic valve has been repaired/replaced. Aortic valve regurgitation is trivial. Aortic valve mean gradient measures 14.3 mmHg. Aortic valve peak gradient measures 23.9 mmHg. Aortic valve area, by VTI measures 0.53 cm. There is a 19 mm Edwards MagnaEase valve present in the aortic position. Echo findings are consistent with normal structure and function of the aortic valve prosthesis. Pulmonic Valve: The pulmonic valve was normal in structure. Pulmonic valve regurgitation is trivial. Aorta: The aortic root and ascending aorta are structurally normal, with no evidence of dilitation. Venous: The inferior vena cava is normal in size with greater than 50% respiratory variability, suggesting right atrial pressure of 3 mmHg. IAS/Shunts: No atrial level shunt detected by color flow Doppler.  LEFT VENTRICLE PLAX 2D LVOT diam:     1.90 cm  Diastology LV SV:         23       LV e' medial:   3.70 cm/s LV SV Index:   14       LV  E/e' medial: 34.3 LVOT Area:     2.84 cm  RIGHT VENTRICLE RV S prime:     14.20 cm/s TAPSE (M-mode): 1.5 cm LEFT ATRIUM             Index       RIGHT  ATRIUM           Index LA diam:        3.60 cm 2.18 cm/m  RA Area:     16.20 cm LA Vol (A2C):   49.1 ml 29.73 ml/m RA Volume:   46.60 ml  28.22 ml/m LA Vol (A4C):   47.9 ml 29.00 ml/m LA Biplane Vol: 53.2 ml 32.21 ml/m  AORTIC VALVE AV Area (Vmax):    0.59 cm AV Area (Vmean):   0.57 cm AV Area (VTI):     0.53 cm AV Vmax:           244.33 cm/s AV Vmean:          174.000 cm/s AV VTI:            0.432 m AV Peak Grad:      23.9 mmHg AV Mean Grad:      14.3 mmHg LVOT Vmax:         51.20 cm/s LVOT Vmean:        35.267 cm/s LVOT VTI:          0.080 m LVOT/AV VTI ratio: 0.19  AORTA Ao Root diam: 2.40 cm MITRAL VALVE                TRICUSPID VALVE MV Area (PHT): 6.71 cm     TR Peak grad:   23.6 mmHg MV Decel Time: 113 msec     TR Vmax:        243.00 cm/s MV E velocity: 127.00 cm/s MV A velocity: 58.10 cm/s   SHUNTS MV E/A ratio:  2.19         Systemic VTI:  0.08 m                             Systemic Diam: 1.90 cm Lyman Bishop MD Electronically signed by Lyman Bishop MD Signature Date/Time: 01/29/2021/12:51:58 PM    Final    VAS US CAROTID (at [facility] and [facility] only)  Result Date: 01/30/2021 Carotid Arterial Duplex Study Indications:       CVA. Risk Factors:      Hypertension, hyperlipidemia, coronary artery disease. Comparison Study:  01-22-2017 Prior carotid artery duplex study showed 1-39%                    stenosis bilaterally. Performing Technologist: Darlin Coco RDMS  Examination Guidelines: A complete evaluation includes B-mode imaging, spectral Doppler, color Doppler, and power Doppler as needed of all accessible portions of each vessel. Bilateral testing is considered an integral part of a complete examination. Limited examinations for reoccurring indications may be performed as noted.  Right Carotid Findings: +----------+--------+--------+--------+------------------+---------------------+           PSV cm/sEDV cm/sStenosisPlaque DescriptionComments               +----------+--------+--------+--------+------------------+---------------------+ CCA Prox  89      16                                                      +----------+--------+--------+--------+------------------+---------------------+  CCA Distal69      16              calcific                                +----------+--------+--------+--------+------------------+---------------------+ ICA Prox  131     31      1-39%   calcific          Velocities may                                                            underestimate degree                                                      of stenosis due to                                                        more proximal                                                             obstruction.          +----------+--------+--------+--------+------------------+---------------------+ ICA Mid   133     25                                                      +----------+--------+--------+--------+------------------+---------------------+ ICA Distal104     23                                                      +----------+--------+--------+--------+------------------+---------------------+ ECA       246             >50%    calcific                                +----------+--------+--------+--------+------------------+---------------------+ +----------+--------+-------+----------------+-------------------+           PSV cm/sEDV cmsDescribe        Arm Pressure (mmHG) +----------+--------+-------+----------------+-------------------+ [id number]            Multiphasic, WNL                    +----------+--------+-------+----------------+-------------------+ +---------+--------+--+--------+--+---------+ VertebralPSV cm/s44EDV cm/s12Antegrade +---------+--------+--+--------+--+---------+  Left Carotid Findings: +----------+--------+--------+--------+------------------+--------+            PSV cm/sEDV cm/sStenosisPlaque DescriptionComments +----------+--------+--------+--------+------------------+--------+ CCA Prox  101  18                                         +----------+--------+--------+--------+------------------+--------+ CCA Distal74      13              calcific                   +----------+--------+--------+--------+------------------+--------+ ICA Prox  140     18      1-39%   calcific                   +----------+--------+--------+--------+------------------+--------+ ICA Mid   126     23                                         +----------+--------+--------+--------+------------------+--------+ ICA Distal104     20                                         +----------+--------+--------+--------+------------------+--------+ ECA       260             >50%    calcific                   +----------+--------+--------+--------+------------------+--------+ +----------+--------+--------+----------------+-------------------+           PSV cm/sEDV cm/sDescribe        Arm Pressure (mmHG) +----------+--------+--------+----------------+-------------------+ [id number]             Multiphasic, WNL                    +----------+--------+--------+----------------+-------------------+ +---------+--------+--+--------+--+---------+ VertebralPSV cm/s97EDV cm/s15Antegrade +---------+--------+--+--------+--+---------+   Summary: Right Carotid: Velocities in the right ICA are consistent with a 1-39% stenosis.                The ECA appears >50% stenosed. Left Carotid: Velocities in the left ICA are consistent with a 1-39% stenosis.               The ECA appears >50% stenosed. Vertebrals:  Bilateral vertebral arteries demonstrate antegrade flow. Subclavians: Normal flow hemodynamics were seen in bilateral subclavian              arteries. *See table(s) above for measurements and observations.  Electronically signed by Antony Contras MD on  01/30/2021 at 8:49:27 AM.    Final        Subjective:  Patient seen and examined at the bedside this morning.  Hemodynamically stable for discharge.  Discharge Exam: Vitals:   02/15/2021 0400 02/04/2021 0844  BP: (!) 145/88 (!) 142/81  Pulse: 96 93  Resp: 18 18  Temp: 98.6 F (37 C) 98.4 F (36.9 C)  SpO2: 97% 98%   Vitals:   02/05/21 2251 02/01/2021 0000 02/05/2021 0400 02/07/2021 0844  BP: (!) 161/86 (!) 159/89 (!) 145/88 (!) 142/81  Pulse: (!) 110 (!) 101 96 93  Resp:  18 18 18   Temp:  98.7 F (37.1 C) 98.6 F (37 C) 98.4 F (36.9 C)  TempSrc:  Oral Oral Oral  SpO2:  96% 97% 98%  Weight:      Height:        General: Pt is alert, awake, not in acute distress  Cardiovascular: Irregularly irregular rhythm., S1/S2 +, no rubs, no gallops Respiratory: CTA bilaterally, no wheezing, no rhonchi Abdominal: Soft, NT, ND, bowel sounds + Extremities: no edema, no cyanosis    The results of significant diagnostics from this hospitalization (including imaging, microbiology, ancillary and laboratory) are listed below for reference.     Microbiology: Recent Results (from the past 240 hour(s))  Urine culture     Status: Abnormal   Collection Time: 01/27/21  5:32 PM   Specimen: Urine, Random  Result Value Ref Range Status   Specimen Description URINE, RANDOM  Final   Special Requests   Final    NONE Performed at [facility], [address], [place], [place] [address]    Culture >=100,000 COLONIES/mL KLEBSIELLA PNEUMONIAE (A)  Final   Report Status 01/29/2021 FINAL  Final   Organism ID, Bacteria KLEBSIELLA PNEUMONIAE (A)  Final      Susceptibility   Klebsiella pneumoniae - MIC*    AMPICILLIN >=32 RESISTANT Resistant     CEFAZOLIN <=4 SENSITIVE Sensitive     CEFEPIME <=0.12 SENSITIVE Sensitive     CEFTRIAXONE <=0.25 SENSITIVE Sensitive     CIPROFLOXACIN <=0.25 SENSITIVE Sensitive     GENTAMICIN <=1 SENSITIVE Sensitive     IMIPENEM <=0.25 SENSITIVE Sensitive      NITROFURANTOIN 32 SENSITIVE Sensitive     TRIMETH/SULFA >=320 RESISTANT Resistant     AMPICILLIN/SULBACTAM >=32 RESISTANT Resistant     PIP/TAZO <=4 SENSITIVE Sensitive     * >=100,000 COLONIES/mL KLEBSIELLA PNEUMONIAE  SARS CORONAVIRUS 2 (TAT 6-24 HRS) Nasopharyngeal Nasopharyngeal Swab     Status: None   Collection Time: 01/28/21  9:58 AM   Specimen: Nasopharyngeal Swab  Result Value Ref Range Status   SARS Coronavirus 2 NEGATIVE NEGATIVE Final    Comment: (NOTE) SARS-CoV-2 target nucleic acids are NOT DETECTED.  The SARS-CoV-2 RNA is generally detectable in upper and lower respiratory specimens during the acute phase of infection. Negative results do not preclude SARS-CoV-2 infection, do not rule out co-infections with other pathogens, and should not be used as the sole basis for treatment or other patient management decisions. Negative results must be combined with clinical observations, patient history, and epidemiological information. The expected result is Negative.  Fact Sheet for Patients: SugarRoll.be  Fact Sheet for Healthcare Providers: https://www.woods-mathews.com/  This test is not yet approved or cleared by the [place] FDA and  has been authorized for detection and/or diagnosis of SARS-CoV-2 by FDA under an Emergency Use Authorization (EUA). This EUA will remain  in effect (meaning this test can be used) for the duration of the COVID-19 declaration under Se ction 564(b)(1) of the Act, 21 U.S.C. section 360bbb-3(b)(1), unless the authorization is terminated or revoked sooner.  Performed at [facility], [place] [address], [place], [place] [address]   SARS Coronavirus 2 by RT PCR (hospital order, performed in [facility] hospital lab) Nasopharyngeal Nasopharyngeal Swab     Status: None   Collection Time: 01/30/21 11:10 AM   Specimen: Nasopharyngeal Swab  Result Value Ref Range Status   SARS Coronavirus 2  NEGATIVE NEGATIVE Final    Comment: (NOTE) SARS-CoV-2 target nucleic acids are NOT DETECTED.  The SARS-CoV-2 RNA is generally detectable in upper and lower respiratory specimens during the acute phase of infection. The lowest concentration of SARS-CoV-2 viral copies this assay can detect is 250 copies / mL. A negative result does not preclude SARS-CoV-2 infection and should not be used as the sole basis for  treatment or other patient management decisions.  A negative result may occur with improper specimen collection / handling, submission of specimen other than nasopharyngeal swab, presence of viral mutation(s) within the areas targeted by this assay, and inadequate number of viral copies (<250 copies / mL). A negative result must be combined with clinical observations, patient history, and epidemiological information.  Fact Sheet for Patients:   StrictlyIdeas.no  Fact Sheet for Healthcare Providers: BankingDealers.co.za  This test is not yet approved or  cleared by the [place] FDA and has been authorized for detection and/or diagnosis of SARS-CoV-2 by FDA under an Emergency Use Authorization (EUA).  This EUA will remain in effect (meaning this test can be used) for the duration of the COVID-19 declaration under Section 564(b)(1) of the Act, 21 U.S.C. section 360bbb-3(b)(1), unless the authorization is terminated or revoked sooner.  Performed at [facility], [place] [address], [place], [place] [address]      Labs: BNP (last 3 results) No results for input(s): BNP in the last 8760 hours. Basic Metabolic Panel: Recent Labs  Lab 01/31/21 1214 02/01/21 0337 02/02/21 0809 02/04/21 0518 02/05/21 0631 02/17/2021 0258  NA  --  132* 133* 134* 136 136  K  --  4.1 4.2 3.9 4.0 3.9  CL  --  99 100 102 102 101  CO2  --  22 23 23 26 26   GLUCOSE  --  136* 119* 118* 142* 120*  BUN  --  29* 41* 33* 27* 19  CREATININE  --  1.03*  1.04* 0.91 0.84 0.75  CALCIUM  --  9.2 8.8* 8.7* 8.5* 8.6*  MG 2.0  --   --   --   --   --    Liver Function Tests: No results for input(s): AST, ALT, ALKPHOS, BILITOT, PROT, ALBUMIN in the last 168 hours. No results for input(s): LIPASE, AMYLASE in the last 168 hours. Recent Labs  Lab 02/01/21 1317  AMMONIA 17   CBC: Recent Labs  Lab 02/01/21 0337 02/02/21 0809 02/04/21 0518 01/30/2021 0258  WBC 11.3* 14.0* 10.5 10.3  NEUTROABS 8.6* 11.0* 7.6 7.3  HGB 12.0 10.7* 10.2* 10.3*  HCT 37.1 33.4* 32.3* 31.4*  MCV 87.1 88.4 88.0 87.0  PLT 252 234 241 269   Cardiac Enzymes: No results for input(s): CKTOTAL, CKMB, CKMBINDEX, TROPONINI in the last 168 hours. BNP: Invalid input(s): POCBNP CBG: Recent Labs  Lab 02/03/21 1041  GLUCAP 135*   D-Dimer No results for input(s): DDIMER in the last 72 hours. Hgb A1c No results for input(s): HGBA1C in the last 72 hours. Lipid Profile No results for input(s): CHOL, HDL, LDLCALC, TRIG, CHOLHDL, LDLDIRECT in the last 72 hours. Thyroid function studies No results for input(s): TSH, T4TOTAL, T3FREE, THYROIDAB in the last 72 hours.  Invalid input(s): FREET3 Anemia work up No results for input(s): VITAMINB12, FOLATE, FERRITIN, TIBC, IRON, RETICCTPCT in the last 72 hours. Urinalysis    Component Value Date/Time   COLORURINE YELLOW 01/27/2021 0900   APPEARANCEUR CLEAR 01/27/2021 0900   LABSPEC 1.005 01/27/2021 0900   PHURINE 7.0 01/27/2021 0900   GLUCOSEU NEGATIVE 01/27/2021 0900   HGBUR NEGATIVE 01/27/2021 0900   BILIRUBINUR NEGATIVE 01/27/2021 0900   KETONESUR NEGATIVE 01/27/2021 0900   PROTEINUR NEGATIVE 01/27/2021 0900   UROBILINOGEN 0.2 12/27/2011 2237   NITRITE POSITIVE (A) 01/27/2021 0900   LEUKOCYTESUR LARGE (A) 01/27/2021 0900   Sepsis Labs Invalid input(s): PROCALCITONIN,  WBC,  LACTICIDVEN Microbiology Recent Results (from the past 240 hour(s))  Urine culture     Status: Abnormal   Collection Time: 01/27/21  5:32 PM    Specimen: Urine, Random  Result Value Ref Range Status   Specimen Description URINE, RANDOM  Final   Special Requests   Final    NONE Performed at [facility], [address], [place], [place] [address]    Culture >=100,000 COLONIES/mL KLEBSIELLA PNEUMONIAE (A)  Final   Report Status 01/29/2021 FINAL  Final   Organism ID, Bacteria KLEBSIELLA PNEUMONIAE (A)  Final      Susceptibility   Klebsiella pneumoniae - MIC*    AMPICILLIN >=32 RESISTANT Resistant     CEFAZOLIN <=4 SENSITIVE Sensitive     CEFEPIME <=0.12 SENSITIVE Sensitive     CEFTRIAXONE <=0.25 SENSITIVE Sensitive     CIPROFLOXACIN <=0.25 SENSITIVE Sensitive     GENTAMICIN <=1 SENSITIVE Sensitive     IMIPENEM <=0.25 SENSITIVE Sensitive     NITROFURANTOIN 32 SENSITIVE Sensitive     TRIMETH/SULFA >=320 RESISTANT Resistant     AMPICILLIN/SULBACTAM >=32 RESISTANT Resistant     PIP/TAZO <=4 SENSITIVE Sensitive     * >=100,000 COLONIES/mL KLEBSIELLA PNEUMONIAE  SARS CORONAVIRUS 2 (TAT 6-24 HRS) Nasopharyngeal Nasopharyngeal Swab     Status: None   Collection Time: 01/28/21  9:58 AM   Specimen: Nasopharyngeal Swab  Result Value Ref Range Status   SARS Coronavirus 2 NEGATIVE NEGATIVE Final    Comment: (NOTE) SARS-CoV-2 target nucleic acids are NOT DETECTED.  The SARS-CoV-2 RNA is generally detectable in upper and lower respiratory specimens during the acute phase of infection. Negative results do not preclude SARS-CoV-2 infection, do not rule out co-infections with other pathogens, and should not be used as the sole basis for treatment or other patient management decisions. Negative results must be combined with clinical observations, patient history, and epidemiological information. The expected result is Negative.  Fact Sheet for Patients: SugarRoll.be  Fact Sheet for Healthcare Providers: https://www.woods-mathews.com/  This test is not yet approved or cleared by the  [place] FDA and  has been authorized for detection and/or diagnosis of SARS-CoV-2 by FDA under an Emergency Use Authorization (EUA). This EUA will remain  in effect (meaning this test can be used) for the duration of the COVID-19 declaration under Se ction 564(b)(1) of the Act, 21 U.S.C. section 360bbb-3(b)(1), unless the authorization is terminated or revoked sooner.  Performed at [facility], [place] [address] [place], [place] [address]   SARS Coronavirus 2 by RT PCR (hospital order, performed in [facility] hospital lab) Nasopharyngeal Nasopharyngeal Swab     Status: None   Collection Time: 01/30/21 11:10 AM   Specimen: Nasopharyngeal Swab  Result Value Ref Range Status   SARS Coronavirus 2 NEGATIVE NEGATIVE Final    Comment: (NOTE) SARS-CoV-2 target nucleic acids are NOT DETECTED.  The SARS-CoV-2 RNA is generally detectable in upper and lower respiratory specimens during the acute phase of infection. The lowest concentration of SARS-CoV-2 viral copies this assay can detect is 250 copies / mL. A negative result does not preclude SARS-CoV-2 infection and should not be used as the sole basis for treatment or other patient management decisions.  A negative result may occur with improper specimen collection / handling, submission of specimen other than nasopharyngeal swab, presence of viral mutation(s) within the areas targeted by this assay, and inadequate number of viral copies (<250 copies / mL). A negative result must be combined with clinical observations, patient history, and epidemiological information.  Fact Sheet for  Patients:   StrictlyIdeas.no  Fact Sheet for Healthcare Providers: BankingDealers.co.za  This test is not yet approved or  cleared by the [place] FDA and has been authorized for detection and/or diagnosis of SARS-CoV-2 by FDA under an Emergency Use Authorization (EUA).  This EUA will remain in  effect (meaning this test can be used) for the duration of the COVID-19 declaration under Section 564(b)(1) of the Act, 21 U.S.C. section 360bbb-3(b)(1), unless the authorization is terminated or revoked sooner.  Performed at [facility], [place] [address], [place], [place] [address]     Please note: You were cared for by a hospitalist during your hospital stay. Once you are discharged, your primary care physician will handle any further medical issues. Please note that NO REFILLS for any discharge medications will be authorized once you are discharged, as it is imperative that you return to your primary care physician (or establish a relationship with a primary care physician if you do not have one) for your post hospital discharge needs so that they can reassess your need for medications and monitor your lab values.    Time coordinating discharge: 40 minutes  SIGNED:   Shelly Coss, MD  [facility] 02/18/2021, 10:59 AM Pager [phone]  If 7PM-7AM, please contact night-coverage [email or web address] Password [email or web address]

## 2021-02-07 ENCOUNTER — Encounter: Payer: Self-pay | Admitting: Nurse Practitioner

## 2021-02-07 ENCOUNTER — Non-Acute Institutional Stay (SKILLED_NURSING_FACILITY): Payer: Medicare Other | Admitting: Nurse Practitioner

## 2021-02-07 DIAGNOSIS — I2510 Atherosclerotic heart disease of native coronary artery without angina pectoris: Secondary | ICD-10-CM

## 2021-02-07 DIAGNOSIS — I480 Paroxysmal atrial fibrillation: Secondary | ICD-10-CM

## 2021-02-07 DIAGNOSIS — I951 Orthostatic hypotension: Secondary | ICD-10-CM | POA: Diagnosis not present

## 2021-02-07 DIAGNOSIS — R413 Other amnesia: Secondary | ICD-10-CM | POA: Diagnosis not present

## 2021-02-07 DIAGNOSIS — D62 Acute posthemorrhagic anemia: Secondary | ICD-10-CM | POA: Diagnosis not present

## 2021-02-07 DIAGNOSIS — I5032 Chronic diastolic (congestive) heart failure: Secondary | ICD-10-CM

## 2021-02-07 DIAGNOSIS — M4800 Spinal stenosis, site unspecified: Secondary | ICD-10-CM | POA: Diagnosis not present

## 2021-02-07 DIAGNOSIS — E039 Hypothyroidism, unspecified: Secondary | ICD-10-CM | POA: Diagnosis not present

## 2021-02-07 DIAGNOSIS — N3281 Overactive bladder: Secondary | ICD-10-CM

## 2021-02-07 DIAGNOSIS — I639 Cerebral infarction, unspecified: Secondary | ICD-10-CM | POA: Diagnosis not present

## 2021-02-07 DIAGNOSIS — K5900 Constipation, unspecified: Secondary | ICD-10-CM

## 2021-02-07 NOTE — Assessment & Plan Note (Signed)
Confusion, in hospital, CT head 02/01/21 no acute intracranial abnormality. UTI, acute CVA, underlying dementia may be contributory.

## 2021-02-07 NOTE — Progress Notes (Signed)
Location:    [facility] Room Number: 32 Place of Service:  SNF (31) Provider:  Maryclaire Stoecker, Lennie Odor NP  Lujean Amel, MD  Patient Care Team: Lujean Amel, MD as PCP - General (Family Medicine) Revankar, Reita Cliche, MD as PCP - Cardiology (Cardiology) Tanda Rockers, MD (Pulmonary Disease) Jacolyn Reedy, MD as Consulting Physician (Cardiology) Clent Jacks, MD as Consulting Physician (Ophthalmology) Hayden Pedro, MD as Consulting Physician (Ophthalmology) Gardiner Barefoot, DPM as Consulting Physician (Podiatry) Hayden Pedro, MD as Consulting Physician (Ophthalmology) Kathrynn Ducking, MD as Consulting Physician (Neurology) Revankar, Reita Cliche, MD as Consulting Physician (Cardiology)  Extended Emergency Contact Information Primary Emergency Contact: Spaeth,[name] Address: [address]          [place], [place] Work Phone: [phone] Mobile Phone: [phone] Relation: Daughter Preferred language: English Interpreter needed? No  Code Status:  Full Code Goals of care: Advanced Directive information Advanced Directives 01/27/2021  Does Patient Have a Medical Advance Directive? Yes  Type of Paramedic of [place];Living will  Does patient want to make changes to medical advance directive? No - Guardian declined  Copy of [place] in Chart? -  Would patient like information on creating a medical advance directive? -  Pre-existing out of facility DNR order (yellow form or pink MOST form) -     Chief Complaint  Patient presents with  . Acute Visit    Medication review    HPI:  Pt is a 87 y.o. female seen today for an acute visit for review medications.   Hospitalized 01/27/21-02/19/2021 for R leg weakness ,slurred speech, she was found to have acute CVA per MRI left cerebral, embolic hemisphere, Eliquis was increased to 5mg  bid, takes Atorvastatin.   UTI fully treated   Orthostatic  hypotension, cortisol unremarkable  Anemia chronic, Hgb 10.3 02/01/2021  Hypothyroidism, takes Levothyroxine, TSH 3.494 01/28/21  AFib, EF 50-65%, LDL 48. F/u cardiology, takes Metoprolol, Eliquis.   CHF euvolemic orthostatic Bun/creat 19/0.75 02/03/2021, EF 50-65%,  CAD Status post CABG in 2013, has history of severe aortic stenosis status post tissue AVR in 2013. Follows with cardiology.  Constipation, takes Senokot, MiraLax, Colace  Urine frequency, takes Oxybutynin  Peripheral neuropathy, takes Gabapentin  Confusion, in hospital, CT head 02/01/21 no acute intracranial abnormality. UTI, acute CVA, underlying dementia may be contributory.    Past Medical History:  Diagnosis Date  . Acute blood loss anemia 05/16/2018  . AIHA (autoimmune hemolytic anemia) (HCC)   . Allergic rhinitis due to pollen 01/18/2021  . Anxiety disorder 01/18/2021  . Aortic stenosis   . Atherosclerotic heart disease of native coronary artery without angina pectoris 01/18/2021  . Atrial fibrillation ([place])    Noted during recent cardiac surgery and treated with amiodarone   . Autoimmune hemolytic anemia (HCC)   . AV heart block 11/23/2020  . CAD (coronary artery disease) 12/26/2011   Severe ostial LM disease noted at cath 12/26/11   . Chronic diastolic heart failure ([place]) 01/17/2012  . Complication of surgical procedure 01/18/2021  . Constipation 01/18/2021  . Coronary artery disease    Cath 1/13 left main  . 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 Dyspnea   . Elevated bilirubin 05/14/2018  . Essential hypertension 01/18/2021  . Hypercoagulable state ([place]) 01/18/2021  . Hyperlipidemia   . Hypertension   . Hypertensive heart disease without CHF   . Hyponatremia 01/17/2012  . Hypothyroidism   . Long term (  current) use of anticoagulants 07/14/2013  . Lumbar degenerative disc disease    Prior lumbar laminectomy 12/2009 Dr. Rolena Infante.  Failed epidural steroids.  Currently on gabapentin   . Lumbar disc disease   .  Lung nodules    Sees [facility], thought to be benign  . Memory problem 01/18/2021  . Mixed hyperlipidemia 01/18/2021  . Obesity   . Obstructive jaundice 05/14/2018  . Overactive bladder 01/18/2021  . PAF (paroxysmal atrial fibrillation) ([place]) 01/30/2019  . Paroxysmal atrial fibrillation ([place]) 01/30/2019  . Personal history of solitary pulmonary nodule 11/07/2011   Followed in Pulmonary clinic/ [facility]/ [facility]   - PET 11/19/11 indeterminant LUL nodule but slt reduced in size vs prev studies so rec f/u cxr in 2 months (tickle file) Wedge resection by Dr. Servando Snare at time of AVR/CABG 12/28/2011 >   wedge biopsy/resection, Left upper lobe - NON-NECROTIZING GRANULOMATOUS INFLAMMATION WITH ASSOCIATED MULTINUCLEATED GIANT CELLS.   . Polyp of colon 01/18/2021  . S/P AVR and CABG 12/28/2011   19 mm Magna Ease pericardial tissue valve CABG x 3 (LIMA-LAD, SVG-Int, SVG-dRCA)12/28/2011 Dr. Servando Snare    . Sciatica 01/18/2021  . Senile purpura ([place]) 01/18/2021  . Sepsis ([place]) 08/17/2018  . Spinal stenosis   . Villous adenoma of rectum    Past Surgical History:  Procedure Laterality Date  . AORTIC VALVE REPLACEMENT  12/28/2011   Procedure: AORTIC VALVE REPLACEMENT (AVR);  Surgeon: Grace Isaac, MD;  Location: [facility];  Service: Open Heart Surgery;  Laterality: N/A;  . CARDIAC CATHETERIZATION  2013  . CHOLECYSTECTOMY    . CORONARY ARTERY BYPASS GRAFT  12/28/2011   Procedure: CORONARY ARTERY BYPASS GRAFTING (CABG);  Surgeon: Grace Isaac, MD;  Location: [facility];  Service: Open Heart Surgery;  Laterality: N/A;  . HERNIA REPAIR    . LEFT AND RIGHT HEART CATHETERIZATION WITH CORONARY ANGIOGRAM N/A 12/26/2011   Procedure: LEFT AND RIGHT HEART CATHETERIZATION WITH CORONARY ANGIOGRAM;  Surgeon: Jacolyn Reedy, MD;  Location: [facility] CATH LAB;  Service: Cardiovascular;  Laterality: N/A;  . LUMBAR LAMINECTOMY    . PARTIAL COLECTOMY  2008   Villous adenoma    Allergies  Allergen Reactions  . Ibuprofen Other (See Comments)     Hx of GI bleed  . Tapentadol Other (See Comments)    Nucynta- Knows she "cannot take"- made the patient "very sick"   . Ace Inhibitors Cough  . Angiotensin Receptor Blockers Cough  . Dicloxacillin Nausea Only and Other (See Comments)    Made the patient lightheaded, also  . Doxycycline Nausea Only  . Hydrocodone Nausea Only and Other (See Comments)    Made the patient lightheaded, also  . Nsaids     Can tolerate only Tylenol (has a history of GI BLEEDS)  . Adhesive [Tape] Rash    Blisters (can use only paper tape)  . Latex Rash and Other (See Comments)    Paper tape only    Allergies as of 02/07/2021      Reactions   Ibuprofen Other (See Comments)   Hx of GI bleed   Tapentadol Other (See Comments)   Nucynta- Knows she "cannot take"- made the patient "very sick"   Ace Inhibitors Cough   Angiotensin Receptor Blockers Cough   Dicloxacillin Nausea Only, Other (See Comments)   Made the patient lightheaded, also   Doxycycline Nausea Only   Hydrocodone Nausea Only, Other (See Comments)   Made the patient lightheaded, also   Nsaids    Can tolerate only  Tylenol (has a history of GI BLEEDS)   Adhesive [tape] Rash   Blisters (can use only paper tape)   Latex Rash, Other (See Comments)   Paper tape only      Medication List       Accurate as of February 07, 2021 11:59 PM. If you have any questions, ask your nurse or doctor.        STOP taking these medications   PROBIOTIC PO Stopped by: Argentina Kosch X Elbert Spickler, NP     TAKE these medications   apixaban 5 MG Tabs tablet Commonly known as: Eliquis Take 1 tablet (5 mg total) by mouth 2 (two) times daily.   atorvastatin 20 MG tablet Commonly known as: LIPITOR TAKE 1 TABLET BY MOUTH EVERYDAY AT BEDTIME   Besivance 0.6 % Susp Generic drug: Besifloxacin HCl Place 1 drop into the left eye See admin instructions. Place 1 drop left eye 4 times daily for 2 days after Avastin eye injections   CALCIUM 600 + D PO Take 1 tablet by mouth  every morning.   cetirizine 10 MG tablet Commonly known as: ZYRTEC Take 10 mg by mouth daily in the afternoon.   diphenhydrAMINE 25 mg capsule Commonly known as: BENADRYL Take 25 mg by mouth 2 (two) times daily as needed (or allergic reactions).   docusate sodium 100 MG capsule Commonly known as: COLACE Take 100 mg by mouth daily in the afternoon.   gabapentin 100 MG capsule Commonly known as: NEURONTIN Take 100 mg by mouth 3 (three) times daily.   levothyroxine 88 MCG tablet Commonly known as: SYNTHROID Take 88 mcg by mouth daily before breakfast.   melatonin 3 MG Tabs tablet Take 1 tablet (3 mg total) by mouth at bedtime.   metoprolol tartrate 50 MG tablet Commonly known as: LOPRESSOR Take 1 tablet (50 mg total) by mouth at bedtime.   metoprolol tartrate 100 MG tablet Commonly known as: LOPRESSOR Take 1 tablet (100 mg total) by mouth daily.   Multi For Her 50+ Tabs Take 1 tablet by mouth daily in the afternoon.   nystatin powder Commonly known as: MYCOSTATIN/NYSTOP Apply topically 2 (two) times daily.   oxybutynin 5 MG tablet Commonly known as: DITROPAN Take 5 mg by mouth every other day.   polyethylene glycol 17 g packet Commonly known as: MIRALAX / GLYCOLAX Take 17 g by mouth 2 (two) times daily.   predniSONE 5 MG tablet Commonly known as: DELTASONE Take 5 mg by mouth daily with breakfast.   saccharomyces boulardii 250 MG capsule Commonly known as: FLORASTOR Take 250 mg by mouth 2 (two) times daily.   Santyl ointment Generic drug: collagenase Apply 1 application topically See admin instructions. Apply daily as directed to the affected toe   senna 8.6 MG Tabs tablet Commonly known as: SENOKOT Take 2 tablets (17.2 mg total) by mouth daily.   zinc oxide 20 % ointment Apply 1 application topically as needed for irritation.       Review of Systems  Constitutional: Positive for activity change and fatigue. Negative for appetite change, diaphoresis  and fever.  HENT: Positive for hearing loss. Negative for congestion, trouble swallowing and voice change.   Respiratory: Negative for cough, shortness of breath and wheezing.   Cardiovascular: Positive for leg swelling. Negative for chest pain and palpitations.  Gastrointestinal: Negative for abdominal pain, constipation, nausea and vomiting.  Genitourinary: Positive for frequency. Negative for dysuria and urgency.  Musculoskeletal: Positive for arthralgias, back pain and gait problem.  Skin:  Ecchymoses all R+L arms.   Neurological: Positive for weakness. Negative for seizures, facial asymmetry, speech difficulty and headaches.       Memory lapses. R leg weakness.   Psychiatric/Behavioral: Negative for agitation, behavioral problems, hallucinations and sleep disturbance. The patient is not nervous/anxious.     Immunization History  Administered Date(s) Administered  . Influenza Split 10/09/2007, 09/06/2009, 09/22/2010, 09/21/2011, 09/15/2012, 09/24/2013, 09/27/2014, 09/28/2015, 09/17/2016, 10/07/2019  . Influenza,inj,Quad PF,6+ Mos 09/18/2017, 09/25/2018, 09/30/2020  . Influenza-Unspecified 08/26/2018, 10/20/2019  . Moderna Sars-Covid-2 Vaccination 12/28/2019, 01/25/2020  . Pneumococcal Conjugate-13 04/21/2014  . Pneumococcal Polysaccharide-23 12/30/2003  . Td 12/30/2003, 10/21/2015  . Zoster 09/12/2006, 08/04/2018, 10/06/2018   Pertinent  Health Maintenance Due  Topic Date Due  . DEXA SCAN  Never done  . INFLUENZA VACCINE  Completed  . PNA vac Low Risk Adult  Completed   No flowsheet data found. Functional Status Survey:    Vitals:   02/07/21 1016  BP: 126/66  Pulse: 90  Resp: 18  Temp: (!) 96.6 F (35.9 C)  SpO2: 95%  Weight: 137 lb (62.1 kg)  Height: 5\' 4"  (1.626 m)   Body mass index is 23.52 kg/m. Physical Exam Constitutional:      Appearance: Normal appearance.  HENT:     Head: Normocephalic and atraumatic.     Nose: Nose normal.     Mouth/Throat:      Mouth: Mucous membranes are moist.  Eyes:     Extraocular Movements: Extraocular movements intact.     Conjunctiva/sclera: Conjunctivae normal.     Pupils: Pupils are equal, round, and reactive to light.  Cardiovascular:     Rate and Rhythm: Normal rate and regular rhythm.     Heart sounds: No murmur heard.   Pulmonary:     Effort: Pulmonary effort is normal.     Breath sounds: No wheezing, rhonchi or rales.  Abdominal:     General: Bowel sounds are normal. There is no distension.     Palpations: Abdomen is soft.     Tenderness: There is no abdominal tenderness. There is no right CVA tenderness, left CVA tenderness, guarding or rebound.  Musculoskeletal:     Cervical back: Normal range of motion and neck supple.     Right lower leg: Edema present.     Left lower leg: Edema present.     Comments: Trace edema BLE  Skin:    General: Skin is warm and dry.     Findings: Bruising present.     Comments: Multiple ecchymoses arms, legs. Dark purple discoloration BLE from knee down.   Neurological:     General: No focal deficit present.     Mental Status: She is alert and oriented to person, place, and time. Mental status is at baseline.     Cranial Nerves: No cranial nerve deficit.     Sensory: No sensory deficit.     Motor: Weakness present.     Coordination: Coordination abnormal.     Gait: Gait abnormal.     Comments: Right leg weakness.   Psychiatric:        Mood and Affect: Mood normal.        Behavior: Behavior normal.        Thought Content: Thought content normal.     Labs reviewed: Recent Labs    01/29/21 0202 01/30/21 0253 01/31/21 1214 02/01/21 0337 02/04/21 0518 02/05/21 0631 02/07/2021 0258  NA 131*   < >  --    < > 134* 136 136  K 3.8   < >  --    < > 3.9 4.0 3.9  CL 98   < >  --    < > 102 102 101  CO2 25   < >  --    < > 23 26 26   GLUCOSE 108*   < >  --    < > 118* 142* 120*  BUN 20   < >  --    < > 33* 27* 19  CREATININE 1.00   < >  --    < > 0.91 0.84  0.75  CALCIUM 8.8*   < >  --    < > 8.7* 8.5* 8.6*  MG 2.1  --  2.0  --   --   --   --    < > = values in this interval not displayed.   Recent Labs    11/28/20 1144 01/27/21 0643  AST 19 22  ALT 14 17  ALKPHOS 50 54  BILITOT 0.8 1.0  PROT 6.3* 6.5  ALBUMIN 3.8 3.8   Recent Labs    02/02/21 0809 02/04/21 0518 02/11/2021 0258  WBC 14.0* 10.5 10.3  NEUTROABS 11.0* 7.6 7.3  HGB 10.7* 10.2* 10.3*  HCT 33.4* 32.3* 31.4*  MCV 88.4 88.0 87.0  PLT 234 241 269   Lab Results  Component Value Date   TSH 3.494 01/28/2021   Lab Results  Component Value Date   HGBA1C 4.5 (L) 01/28/2021   Lab Results  Component Value Date   CHOL 123 01/28/2021   HDL 55 01/28/2021   LDLCALC 48 01/28/2021   TRIG 102 01/28/2021   CHOLHDL 2.2 01/28/2021    Significant Diagnostic Results in last 30 days:  CT ANGIO HEAD W OR WO CONTRAST  Result Date: 01/28/2021 CLINICAL DATA:  Stroke. TIA. Left cerebellar and basal ganglia strokes. EXAM: CT ANGIOGRAPHY HEAD AND NECK TECHNIQUE: Multidetector CT imaging of the head and neck was performed using the standard protocol during bolus administration of intravenous contrast. Multiplanar CT image reconstructions and MIPs were obtained to evaluate the vascular anatomy. Carotid stenosis measurements (when applicable) are obtained utilizing NASCET criteria, using the distal internal carotid diameter as the denominator. CONTRAST:  46mL OMNIPAQUE IOHEXOL 350 MG/ML SOLN COMPARISON:  CT and MRI studies done yesterday. FINDINGS: CT HEAD FINDINGS Brain: Focal low-density now evident in the left cerebellum. No evidence of hemorrhagic transformation. Cerebral hemispheres show a background pattern of extensive chronic small vessel disease of the white matter. The acute infarction in the left basal ganglia and radiating white matter tracts is barely discernible. No hemorrhagic transformation. No new insult. No hydrocephalus or extra-axial collection. Vascular: There is  atherosclerotic calcification of the major vessels at the base of the brain. Skull: Negative Sinuses: Clear Orbits: Normal Review of the MIP images confirms the above findings CTA NECK FINDINGS Aortic arch: Aortic atherosclerotic calcification. Branching pattern is normal without flow limiting stenosis. Right carotid system: Common carotid artery shows atherosclerotic plaque but without flow limiting stenosis proximal to the bifurcation. Dense calcified plaque at the carotid bifurcation and ICA bulb. Minimal diameter in the ICA bulb is 1.5 mm. Compared to a more distal cervical ICA of 5 mm, this indicates a 70% stenosis. Left carotid system: Common carotid artery shows scattered plaque but is sufficiently patent to the bifurcation region. Dense calcified plaque at the carotid bifurcation and ICA bulb. Minimal diameter in the ICA bulb is 2.5 mm. Compared to a more distal cervical ICA diameter of  5 mm, this indicates a 50% stenosis. Vertebral arteries: Both subclavian arteries show atherosclerotic disease but without a flow limiting stenosis. Both vertebral arteries are patent with stenosis of 50% at their origins. Beyond that, the vessels are patent through the cervical region to the foramen magnum. Skeleton: Ordinary cervical spondylosis. Chronic arthropathy at the C1-2 articulation. Other neck: No mass or lymphadenopathy in the neck. Upper chest: Mild scarring at the lung apices.  No active process. Review of the MIP images confirms the above findings CTA HEAD FINDINGS Anterior circulation: Both internal carotid arteries are patent through the skull base and siphon regions. There is calcification in the carotid siphon regions with stenosis estimated at 30% on both sides. The anterior and middle cerebral vessels are patent without proximal stenosis, aneurysm or vascular malformation. No large or medium vessel occlusion identified. Posterior circulation: Both vertebral arteries are patent through the foramen magnum  to the basilar. Focal plaque in the left vertebral V4 segment with stenosis of 30%. No basilar stenosis. Major posterior circulation branch vessels are patent. Left PCA receives its supply from the anterior circulation. Venous sinuses: Patent and normal. Anatomic variants: None Review of the MIP images confirms the above findings IMPRESSION: 1. Dense calcified plaque at both carotid bifurcations and ICA bulb regions. 70% stenosis of the ICA bulb on the right. 50% stenosis of the ICA bulb on the left. 2. 50% stenoses at both vertebral artery origins. 30% stenosis of the left vertebral V4 segment. 3. No intracranial large or medium vessel occlusion or correctable proximal stenosis. Aortic Atherosclerosis (ICD10-I70.0). Electronically Signed   By: Nelson Chimes M.D.   On: 01/28/2021 19:56   DG Abd 1 View  Result Date: 02/05/2021 CLINICAL DATA:  Abdominal distension EXAM: ABDOMEN - 1 VIEW COMPARISON:  February 01, 2021 FINDINGS: No appreciable bowel dilatation or air-fluid level to suggest bowel obstruction. No free air. Lung bases clear. Postoperative changes noted in the upper abdomen. There is aortic and splenic artery atherosclerotic calcification. IMPRESSION: No bowel obstruction or free air. Lung bases clear. Aortic Atherosclerosis (ICD10-I70.0). Electronically Signed   By: Lowella Grip III M.D.   On: 02/05/2021 09:00   DG Abd 1 View  Result Date: 02/01/2021 CLINICAL DATA:  Abdominal pain and distension EXAM: ABDOMEN - 1 VIEW COMPARISON:  CT abdomen 05/14/2018 FINDINGS: Small bowel pattern is unremarkable. Large amount of gas and stool throughout the colon. Peripheral gas pattern in the rectum presumed to be peripheral gas around impacted stool. The unlikely possibility pneumatosis is not excluded. Arterial vascular calcification is noted throughout. Clips in the right upper quadrant consistent with previous cholecystectomy. Scoliosis and degenerative changes of the spine. IMPRESSION: Large amount of gas  and stool throughout the colon. Peripheral gas pattern in the rectum presumed to be peripheral gas around impacted stool. The unlikely possibility of pneumatosis is not excluded. If there is clinical concern regarding the possibility bowel infarction, consider CT of the abdomen pelvis. Otherwise, the findings could simply be due to constipation. Electronically Signed   By: Nelson Chimes M.D.   On: 02/01/2021 19:26   CT HEAD WO CONTRAST  Result Date: 02/01/2021 CLINICAL DATA:  Delirium. EXAM: CT HEAD WITHOUT CONTRAST TECHNIQUE: Contiguous axial images were obtained from the base of the skull through the vertex without intravenous contrast. COMPARISON:  Noncontrast head CT 01/27/2021. Brain MRI 01/27/2021. CT angiogram head/neck 01/28/2021. FINDINGS: Brain: Moderate cerebral and cerebellar atrophy. Known early subacute infarct within the left corona radiata/basal ganglia. Background moderate multifocal T2/FLAIR hyperintensity within the  cerebral white matter is nonspecific compatible with chronic small vessel ischemic disease. Redemonstrated small early subacute infarct within the left cerebellar hemisphere. Superimposed small chronic infarcts within the bilateral cerebellar hemispheres. There is no acute intracranial hemorrhage. No extra-axial fluid collection. No evidence of intracranial mass. No midline shift. Vascular: No hyperdense vessel.  Atherosclerotic calcifications. Skull: Normal. Negative for fracture or focal lesion. Sinuses/Orbits: Visualized orbits show no acute finding. Trace ethmoid sinus mucosal thickening. IMPRESSION: No evidence of interval acute intracranial abnormality. Known subacute infarcts within the left corona radiata/basal ganglia and left cerebellar hemisphere. Stable background moderate generalized atrophy of the brain and chronic small vessel ischemic disease. Unchanged small remote infarcts within the bilateral cerebellar hemispheres. Electronically Signed   By: Kellie Simmering DO   On:  02/01/2021 14:59   CT HEAD WO CONTRAST  Result Date: 01/27/2021 CLINICAL DATA:  Right-sided weakness with slurred speech EXAM: CT HEAD WITHOUT CONTRAST TECHNIQUE: Contiguous axial images were obtained from the base of the skull through the vertex without intravenous contrast. COMPARISON:  02/19/2017 FINDINGS: Brain: No evidence of acute gray matter infarction, hemorrhage, hydrocephalus, extra-axial collection or mass lesion/mass effect. Confluent chronic small vessel ischemia in the deep cerebral white matter. Increased number of chronic appearing bilateral cerebellar infarcts. Age congruent brain volume Vascular: No hyperdense vessel or unexpected calcification. Skull: Normal. Negative for fracture or focal lesion. Sinuses/Orbits: Bilateral cataract resection. IMPRESSION: 1. No acute finding. 2. Extensive chronic small vessel disease. Electronically Signed   By: Monte Fantasia M.D.   On: 01/27/2021 07:23   CT ANGIO NECK W OR WO CONTRAST  Result Date: 01/28/2021 CLINICAL DATA:  Stroke. TIA. Left cerebellar and basal ganglia strokes. EXAM: CT ANGIOGRAPHY HEAD AND NECK TECHNIQUE: Multidetector CT imaging of the head and neck was performed using the standard protocol during bolus administration of intravenous contrast. Multiplanar CT image reconstructions and MIPs were obtained to evaluate the vascular anatomy. Carotid stenosis measurements (when applicable) are obtained utilizing NASCET criteria, using the distal internal carotid diameter as the denominator. CONTRAST:  76mL OMNIPAQUE IOHEXOL 350 MG/ML SOLN COMPARISON:  CT and MRI studies done yesterday. FINDINGS: CT HEAD FINDINGS Brain: Focal low-density now evident in the left cerebellum. No evidence of hemorrhagic transformation. Cerebral hemispheres show a background pattern of extensive chronic small vessel disease of the white matter. The acute infarction in the left basal ganglia and radiating white matter tracts is barely discernible. No hemorrhagic  transformation. No new insult. No hydrocephalus or extra-axial collection. Vascular: There is atherosclerotic calcification of the major vessels at the base of the brain. Skull: Negative Sinuses: Clear Orbits: Normal Review of the MIP images confirms the above findings CTA NECK FINDINGS Aortic arch: Aortic atherosclerotic calcification. Branching pattern is normal without flow limiting stenosis. Right carotid system: Common carotid artery shows atherosclerotic plaque but without flow limiting stenosis proximal to the bifurcation. Dense calcified plaque at the carotid bifurcation and ICA bulb. Minimal diameter in the ICA bulb is 1.5 mm. Compared to a more distal cervical ICA of 5 mm, this indicates a 70% stenosis. Left carotid system: Common carotid artery shows scattered plaque but is sufficiently patent to the bifurcation region. Dense calcified plaque at the carotid bifurcation and ICA bulb. Minimal diameter in the ICA bulb is 2.5 mm. Compared to a more distal cervical ICA diameter of 5 mm, this indicates a 50% stenosis. Vertebral arteries: Both subclavian arteries show atherosclerotic disease but without a flow limiting stenosis. Both vertebral arteries are patent with stenosis of 50% at their origins.  Beyond that, the vessels are patent through the cervical region to the foramen magnum. Skeleton: Ordinary cervical spondylosis. Chronic arthropathy at the C1-2 articulation. Other neck: No mass or lymphadenopathy in the neck. Upper chest: Mild scarring at the lung apices.  No active process. Review of the MIP images confirms the above findings CTA HEAD FINDINGS Anterior circulation: Both internal carotid arteries are patent through the skull base and siphon regions. There is calcification in the carotid siphon regions with stenosis estimated at 30% on both sides. The anterior and middle cerebral vessels are patent without proximal stenosis, aneurysm or vascular malformation. No large or medium vessel occlusion  identified. Posterior circulation: Both vertebral arteries are patent through the foramen magnum to the basilar. Focal plaque in the left vertebral V4 segment with stenosis of 30%. No basilar stenosis. Major posterior circulation branch vessels are patent. Left PCA receives its supply from the anterior circulation. Venous sinuses: Patent and normal. Anatomic variants: None Review of the MIP images confirms the above findings IMPRESSION: 1. Dense calcified plaque at both carotid bifurcations and ICA bulb regions. 70% stenosis of the ICA bulb on the right. 50% stenosis of the ICA bulb on the left. 2. 50% stenoses at both vertebral artery origins. 30% stenosis of the left vertebral V4 segment. 3. No intracranial large or medium vessel occlusion or correctable proximal stenosis. Aortic Atherosclerosis (ICD10-I70.0). Electronically Signed   By: Nelson Chimes M.D.   On: 01/28/2021 19:56   MR BRAIN WO CONTRAST  Result Date: 02/03/2021 CLINICAL DATA:  Delirium.  Recent stroke. EXAM: MRI HEAD WITHOUT CONTRAST TECHNIQUE: Multiplanar, multiecho pulse sequences of the brain and surrounding structures were obtained without intravenous contrast. COMPARISON:  Head CT 02/01/2021 and MRI 01/27/2021 FINDINGS: Brain: Recent infarcts are again noted in the left corona radiata and left cerebellum. The left corona radiata infarct has slightly enlarged from the prior MRI while the cerebellar infarct is unchanged. No new infarct is identified elsewhere. Background patchy to confluent T2 hyperintensities in the cerebral white matter bilaterally are unchanged and nonspecific but compatible with moderate chronic small-vessel ischemic disease. Chronic bilateral cerebellar infarcts are again noted. There is moderate cerebral atrophy. Vascular: Major intracranial vascular flow voids are preserved. Skull and upper cervical spine: Unremarkable bone marrow signal. Sinuses/Orbits: Bilateral cataract extraction. Paranasal sinuses and mastoid air  cells are clear. Other: None. IMPRESSION: 1. Slight enlargement of the left corona radiata infarct since the 01/27/2021 MRI. 2. Unchanged subacute left cerebellar infarct. 3. Moderate chronic small-vessel ischemic disease. Electronically Signed   By: Logan Bores M.D.   On: 02/03/2021 13:04   MR BRAIN WO CONTRAST  Addendum Date: 01/27/2021   ADDENDUM REPORT: 01/27/2021 12:00 ADDENDUM: These results were called by telephone at the time of interpretation on 01/27/2021 at 12:00 pm to provider JOSHUA ZAVITZ , who verbally acknowledged these results. Electronically Signed   By: Pedro Earls M.D.   On: 01/27/2021 12:00   Result Date: 01/27/2021 CLINICAL DATA:  Stroke suspected. EXAM: MRI HEAD WITHOUT CONTRAST TECHNIQUE: Multiplanar, multiecho pulse sequences of the brain and surrounding structures were obtained without intravenous contrast. COMPARISON:  Head CT January 27, 2021. FINDINGS: Brain: Areas of restricted diffusion within the left cerebellar hemisphere and left caudate body extending into the lenticulocapsular region, consistent with acute infarct. No hemorrhage, hydrocephalus, extra-axial collection or mass lesion. Scattered and confluent foci of T2 hyperintensity are seen within the white matter of the cerebral hemispheres, nonspecific, most likely related to chronic microvascular ischemic changes. Remote lacunar infarcts  in the bilateral cerebellar hemispheres. Vascular: Normal flow voids. Skull and upper cervical spine: Normal marrow signal. Sinuses/Orbits: Bilateral lens surgery. Paranasal sinuses are clear. IMPRESSION: 1. Areas of restricted diffusion within the left cerebellar hemisphere and left caudate body extending into the lenticulocapsular region, consistent with acute infarcts. 2. Remote lacunar infarcts in the bilateral cerebellar hemispheres. 3. Moderate chronic microvascular ischemic changes. Electronically Signed: By: Pedro Earls M.D. On: 01/27/2021 11:54    ECHOCARDIOGRAM COMPLETE  Result Date: 01/29/2021    ECHOCARDIOGRAM REPORT   Patient Name:   DELANA MANGANELLO Date of Exam: 01/29/2021 Medical Rec #:  [id number]           Height:       62.0 in Accession #:    [id number]          Weight:       141.8 lb Date of Birth:  10-23-1928            BSA:          1.652 m Patient Age:    5 years            BP:           161/92 mmHg Patient Gender: F                   HR:           116 bpm. Exam Location:  Inpatient Procedure: 2D Echo Indications:    Stroke 434.91 / I163.9  History:        Patient has prior history of Echocardiogram examinations, most                 recent 01/17/2012. CAD, Prior CABG, Stroke, Arrythmias:Atrial                 Fibrillation; Risk Factors:Dyslipidemia, Former Smoker and                 Hypertension. Sepsis, 19 mm Magna Ease pericardial tissue valve                 12/28/2011.                 Aortic Valve: 19 mm Edwards MagnaEase valve is present in the                 aortic position.  Sonographer:    Leavy Cella Referring Phys: [place] Comments: Restricted Mobility. IMPRESSIONS  1. Left ventricular ejection fraction, by estimation, is 60 to 65%. The left ventricle has normal function. The left ventricle has no regional wall motion abnormalities. Left ventricular diastolic function could not be evaluated.  2. Right ventricular systolic function is normal. The right ventricular size is normal. There is normal pulmonary artery systolic pressure.  3. The mitral valve is abnormal. Trivial mitral valve regurgitation. Moderate mitral annular calcification.  4. The aortic valve has been repaired/replaced. Aortic valve regurgitation is trivial. There is a 19 mm Edwards MagnaEase valve present in the aortic position. Echo findings are consistent with normal structure and function of the aortic valve prosthesis. Aortic valve mean gradient measures 14.3 mmHg. Aortic valve Vmax measures 2.44 m/s.  5. The inferior vena cava is  normal in size with greater than 50% respiratory variability, suggesting right atrial pressure of 3 mmHg. Comparison(s): Prior images unable to be directly viewed, comparison made by report only. Changes from prior study are noted. 01/17/12: LVEF 55-60%, aortic bioprosthesis -mean  gradient 22 mmHg. FINDINGS  Left Ventricle: Left ventricular ejection fraction, by estimation, is 60 to 65%. The left ventricle has normal function. The left ventricle has no regional wall motion abnormalities. The left ventricular internal cavity size was normal in size. There is  no left ventricular hypertrophy. Left ventricular diastolic function could not be evaluated due to atrial fibrillation. Left ventricular diastolic function could not be evaluated. Right Ventricle: The right ventricular size is normal. No increase in right ventricular wall thickness. Right ventricular systolic function is normal. There is normal pulmonary artery systolic pressure. The tricuspid regurgitant velocity is 2.43 m/s, and  with an assumed right atrial pressure of 3 mmHg, the estimated right ventricular systolic pressure is 64.3 mmHg. Left Atrium: Left atrial size was normal in size. Right Atrium: Right atrial size was normal in size. Pericardium: There is no evidence of pericardial effusion. Mitral Valve: The mitral valve is abnormal. There is mild thickening of the mitral valve leaflet(s). Moderate mitral annular calcification. Trivial mitral valve regurgitation. Tricuspid Valve: The tricuspid valve is grossly normal. Tricuspid valve regurgitation is trivial. Aortic Valve: The aortic valve has been repaired/replaced. Aortic valve regurgitation is trivial. Aortic valve mean gradient measures 14.3 mmHg. Aortic valve peak gradient measures 23.9 mmHg. Aortic valve area, by VTI measures 0.53 cm. There is a 19 mm Edwards MagnaEase valve present in the aortic position. Echo findings are consistent with normal structure and function of the aortic valve  prosthesis. Pulmonic Valve: The pulmonic valve was normal in structure. Pulmonic valve regurgitation is trivial. Aorta: The aortic root and ascending aorta are structurally normal, with no evidence of dilitation. Venous: The inferior vena cava is normal in size with greater than 50% respiratory variability, suggesting right atrial pressure of 3 mmHg. IAS/Shunts: No atrial level shunt detected by color flow Doppler.  LEFT VENTRICLE PLAX 2D LVOT diam:     1.90 cm  Diastology LV SV:         23       LV e' medial:   3.70 cm/s LV SV Index:   14       LV E/e' medial: 34.3 LVOT Area:     2.84 cm  RIGHT VENTRICLE RV S prime:     14.20 cm/s TAPSE (M-mode): 1.5 cm LEFT ATRIUM             Index       RIGHT ATRIUM           Index LA diam:        3.60 cm 2.18 cm/m  RA Area:     16.20 cm LA Vol (A2C):   49.1 ml 29.73 ml/m RA Volume:   46.60 ml  28.22 ml/m LA Vol (A4C):   47.9 ml 29.00 ml/m LA Biplane Vol: 53.2 ml 32.21 ml/m  AORTIC VALVE AV Area (Vmax):    0.59 cm AV Area (Vmean):   0.57 cm AV Area (VTI):     0.53 cm AV Vmax:           244.33 cm/s AV Vmean:          174.000 cm/s AV VTI:            0.432 m AV Peak Grad:      23.9 mmHg AV Mean Grad:      14.3 mmHg LVOT Vmax:         51.20 cm/s LVOT Vmean:        35.267 cm/s LVOT VTI:  0.080 m LVOT/AV VTI ratio: 0.19  AORTA Ao Root diam: 2.40 cm MITRAL VALVE                TRICUSPID VALVE MV Area (PHT): 6.71 cm     TR Peak grad:   23.6 mmHg MV Decel Time: 113 msec     TR Vmax:        243.00 cm/s MV E velocity: 127.00 cm/s MV A velocity: 58.10 cm/s   SHUNTS MV E/A ratio:  2.19         Systemic VTI:  0.08 m                             Systemic Diam: 1.90 cm Lyman Bishop MD Electronically signed by Lyman Bishop MD Signature Date/Time: 01/29/2021/12:51:58 PM    Final    VAS US CAROTID (at [facility] and [facility] only)  Result Date: 01/30/2021 Carotid Arterial Duplex Study Indications:       CVA. Risk Factors:      Hypertension, hyperlipidemia, coronary artery disease. Comparison  Study:  01-22-2017 Prior carotid artery duplex study showed 1-39%                    stenosis bilaterally. Performing Technologist: Darlin Coco RDMS  Examination Guidelines: A complete evaluation includes B-mode imaging, spectral Doppler, color Doppler, and power Doppler as needed of all accessible portions of each vessel. Bilateral testing is considered an integral part of a complete examination. Limited examinations for reoccurring indications may be performed as noted.  Right Carotid Findings: +----------+--------+--------+--------+------------------+---------------------+           PSV cm/sEDV cm/sStenosisPlaque DescriptionComments              +----------+--------+--------+--------+------------------+---------------------+ CCA Prox  89      16                                                      +----------+--------+--------+--------+------------------+---------------------+ CCA Distal69      16              calcific                                +----------+--------+--------+--------+------------------+---------------------+ ICA Prox  131     31      1-39%   calcific          Velocities may                                                            underestimate degree                                                      of stenosis due to  more proximal                                                             obstruction.          +----------+--------+--------+--------+------------------+---------------------+ ICA Mid   133     25                                                      +----------+--------+--------+--------+------------------+---------------------+ ICA Distal104     23                                                      +----------+--------+--------+--------+------------------+---------------------+ ECA       246             >50%    calcific                                 +----------+--------+--------+--------+------------------+---------------------+ +----------+--------+-------+----------------+-------------------+           PSV cm/sEDV cmsDescribe        Arm Pressure (mmHG) +----------+--------+-------+----------------+-------------------+ [id number]            Multiphasic, WNL                    +----------+--------+-------+----------------+-------------------+ +---------+--------+--+--------+--+---------+ VertebralPSV cm/s44EDV cm/s12Antegrade +---------+--------+--+--------+--+---------+  Left Carotid Findings: +----------+--------+--------+--------+------------------+--------+           PSV cm/sEDV cm/sStenosisPlaque DescriptionComments +----------+--------+--------+--------+------------------+--------+ CCA Prox  101     18                                         +----------+--------+--------+--------+------------------+--------+ CCA Distal74      13              calcific                   +----------+--------+--------+--------+------------------+--------+ ICA Prox  140     18      1-39%   calcific                   +----------+--------+--------+--------+------------------+--------+ ICA Mid   126     23                                         +----------+--------+--------+--------+------------------+--------+ ICA Distal104     20                                         +----------+--------+--------+--------+------------------+--------+ ECA       260             >50%    calcific                   +----------+--------+--------+--------+------------------+--------+ +----------+--------+--------+----------------+-------------------+  PSV cm/sEDV cm/sDescribe        Arm Pressure (mmHG) +----------+--------+--------+----------------+-------------------+ [id number]             Multiphasic, WNL                    +----------+--------+--------+----------------+-------------------+  +---------+--------+--+--------+--+---------+ VertebralPSV cm/s97EDV cm/s15Antegrade +---------+--------+--+--------+--+---------+   Summary: Right Carotid: Velocities in the right ICA are consistent with a 1-39% stenosis.                The ECA appears >50% stenosed. Left Carotid: Velocities in the left ICA are consistent with a 1-39% stenosis.               The ECA appears >50% stenosed. Vertebrals:  Bilateral vertebral arteries demonstrate antegrade flow. Subclavians: Normal flow hemodynamics were seen in bilateral subclavian              arteries. *See table(s) above for measurements and observations.  Electronically signed by Antony Contras MD on 01/30/2021 at 8:49:27 AM.    Final     Assessment/Plan Acute CVA (cerebrovascular accident) [facility]) Hospitalized 01/27/21-01/27/2021 for R leg weakness ,slurred speech, she was found to have acute CVA per MRI left cerebral, embolic hemisphere, Eliquis was increased to 5mg  bid, takes Atorvastatin.    Orthostatic hypotension Orthostatic hypotension, cortisol unremarkable  Acute blood loss anemia Anemia chronic, Hgb 10.3 02/04/2021   Hypothyroidism , takes Levothyroxine, TSH 3.494 01/28/21   Atrial fibrillation (HCC) EF 50-65%, LDL 48. F/u cardiology, takes Metoprolol, Eliquis.    Chronic diastolic heart failure (HCC) euvolemic orthostatic Bun/creat 19/0.75 02/19/2021, EF 50-65%,  Coronary artery disease Status post CABG in 2013, has history of severe aortic stenosis status post tissue AVR in 2013. Follows with cardiology.   Constipation takes Senokot, MiraLax, Colace   Overactive bladder takes Oxybutynin  Spinal stenosis  takes Gabapentin   Memory problem Confusion, in hospital, CT head 02/01/21 no acute intracranial abnormality. UTI, acute CVA, underlying dementia may be contributory.     Family/ staff Communication: plan of care reviewed with the patient and charge nurse.   Labs/tests ordered:    Time spend 35 minutes.

## 2021-02-07 NOTE — Assessment & Plan Note (Signed)
euvolemic orthostatic Bun/creat 19/0.75 01/25/2021, EF 50-65%,

## 2021-02-07 NOTE — Assessment & Plan Note (Signed)
Status post CABG in 2013, has history of severe aortic stenosis status post tissue AVR in 2013. Follows with cardiology.

## 2021-02-07 NOTE — Assessment & Plan Note (Signed)
EF 50-65%, LDL 48. F/u cardiology, takes Metoprolol, Eliquis.

## 2021-02-07 NOTE — Assessment & Plan Note (Signed)
Orthostatic hypotension, cortisol unremarkable

## 2021-02-07 NOTE — Assessment & Plan Note (Signed)
,   takes Levothyroxine, TSH 3.494 01/28/21

## 2021-02-07 NOTE — Assessment & Plan Note (Signed)
Hospitalized 01/27/21-02/10/2021 for R leg weakness ,slurred speech, she was found to have acute CVA per MRI left cerebral, embolic hemisphere, Eliquis was increased to 5mg  bid, takes Atorvastatin.

## 2021-02-07 NOTE — Assessment & Plan Note (Signed)
takes Senokot, MiraLax, Colace

## 2021-02-07 NOTE — Assessment & Plan Note (Signed)
Anemia chronic, Hgb 10.3 01/24/2021

## 2021-02-07 NOTE — Assessment & Plan Note (Signed)
takes Oxybutynin

## 2021-02-07 NOTE — Assessment & Plan Note (Signed)
takes Gabapentin

## 2021-02-08 ENCOUNTER — Encounter: Payer: Self-pay | Admitting: Nurse Practitioner

## 2021-02-09 ENCOUNTER — Encounter: Payer: Self-pay | Admitting: Internal Medicine

## 2021-02-09 ENCOUNTER — Non-Acute Institutional Stay (SKILLED_NURSING_FACILITY): Payer: Medicare Other | Admitting: Internal Medicine

## 2021-02-09 DIAGNOSIS — I951 Orthostatic hypotension: Secondary | ICD-10-CM

## 2021-02-09 DIAGNOSIS — I639 Cerebral infarction, unspecified: Secondary | ICD-10-CM | POA: Diagnosis not present

## 2021-02-09 DIAGNOSIS — E785 Hyperlipidemia, unspecified: Secondary | ICD-10-CM

## 2021-02-09 DIAGNOSIS — D591 Autoimmune hemolytic anemia, unspecified: Secondary | ICD-10-CM | POA: Diagnosis not present

## 2021-02-09 DIAGNOSIS — E039 Hypothyroidism, unspecified: Secondary | ICD-10-CM | POA: Diagnosis not present

## 2021-02-09 DIAGNOSIS — I480 Paroxysmal atrial fibrillation: Secondary | ICD-10-CM

## 2021-02-09 DIAGNOSIS — R5383 Other fatigue: Secondary | ICD-10-CM

## 2021-02-09 LAB — HEPATIC FUNCTION PANEL
ALT: 17 (ref 7–35)
AST: 21 (ref 13–35)
Alkaline Phosphatase: 72 (ref 25–125)
Bilirubin, Total: 1

## 2021-02-09 LAB — COMPREHENSIVE METABOLIC PANEL
Albumin: 3.3 — AB (ref 3.5–5.0)
Calcium: 9.2 (ref 8.7–10.7)
Globulin: 1.9

## 2021-02-09 LAB — BASIC METABOLIC PANEL
BUN: 24 — AB (ref 4–21)
CO2: 31 — AB (ref 13–22)
Chloride: 91 — AB (ref 99–108)
Creatinine: 0.9 (ref 0.5–1.1)
Glucose: 134
Potassium: 4.5 (ref 3.4–5.3)
Sodium: 128 — AB (ref 137–147)

## 2021-02-09 LAB — CBC AND DIFFERENTIAL
HCT: 31 — AB (ref 36–46)
Hemoglobin: 10.2 — AB (ref 12.0–16.0)
Neutrophils Absolute: 11138
Platelets: 295 (ref 150–399)
WBC: 13.5

## 2021-02-09 LAB — CBC: RBC: 3.61 — AB (ref 3.87–5.11)

## 2021-02-09 NOTE — Progress Notes (Signed)
Provider:  Veleta Miners MD Location:    [facility] Room Number: 32 Place of Service:  SNF (31)  PCP: Lujean Amel, MD Patient Care Team: Lujean Amel, MD as PCP - General (Family Medicine) Revankar, Reita Cliche, MD as PCP - Cardiology (Cardiology) Tanda Rockers, MD (Pulmonary Disease) Jacolyn Reedy, MD as Consulting Physician (Cardiology) Clent Jacks, MD as Consulting Physician (Ophthalmology) Hayden Pedro, MD as Consulting Physician (Ophthalmology) Gardiner Barefoot, DPM as Consulting Physician (Podiatry) Hayden Pedro, MD as Consulting Physician (Ophthalmology) Kathrynn Ducking, MD as Consulting Physician (Neurology) Revankar, Reita Cliche, MD as Consulting Physician (Cardiology)  Extended Emergency Contact Information Primary Emergency Contact: Spaeth,[name] Address: [address]          [place], [place] Work Phone: [phone] Mobile Phone: [phone] Relation: Daughter Preferred language: English Interpreter needed? No  Code Status: DNR Goals of Care: Advanced Directive information Advanced Directives 01/27/2021  Does Patient Have a Medical Advance Directive? Yes  Type of Paramedic of [place];Living will  Does patient want to make changes to medical advance directive? No - Guardian declined  Copy of [place] in Chart? -  Would patient like information on creating a medical advance directive? -  Pre-existing out of facility DNR order (yellow form or pink MOST form) -      Chief Complaint  Patient presents with  . New Admit To SNF    Admission to SNF    HPI: Patient is a 87 y.o. female seen today for admission to SNF for therapy  Patient was admitted in the hospital from 2/4-2/14 with acute nonhemorrhagic CVA,and  metabolic encephalopathy  Patient has a history of paroxysmal A. fib on was on Eliquis, hypertension History of advanced AV block mostly at night,  carotid stenosis, S/P AVR History of AIHA on chronic prednisone  Patient lives in [place].  Was very active and walk with her walker.  She complained of weakness in her and slurred speech.  In ED was found to have nonhemorrhagic acute left cerebellar and left caudate infarcts.  Also remote lacunar infarcts in the cerebellar hemisphere strokes  Her stroke was thought to be embolic.  Her Eliquis was increased to 5 mg twice daily.  Continue on Lipitor.  Echo was negative for any intracardiac source of emboli. Patient also had confusion which was new for her.  Was started on melatonin at night.  Also treated for UTI with antibiotics for 5 days  Patient is now admitted in SNF for therapy But since she has been here she has been more confused.  This morning was very sleepy tired weak.  No cough fever chest pain shortness of breath or dysuria.  Her daughter in the room says patient is hallucinating. Patient did respond appropriately to me.  Did not remember much from the hospitalization. Has multiple Skin tears No Pain or other acute symptoms   Past Medical History:  Diagnosis Date  . Acute blood loss anemia 05/16/2018  . AIHA (autoimmune hemolytic anemia) (HCC)   . Allergic rhinitis due to pollen 01/18/2021  . Anxiety disorder 01/18/2021  . Aortic stenosis   . Atherosclerotic heart disease of native coronary artery without angina pectoris 01/18/2021  . Atrial fibrillation ([place])    Noted during recent cardiac surgery and treated with amiodarone   . Autoimmune hemolytic anemia (HCC)   . AV heart block 11/23/2020  . CAD (coronary artery disease) 12/26/2011   Severe ostial LM  disease noted at cath 12/26/11   . Chronic diastolic heart failure ([place]) 01/17/2012  . Complication of surgical procedure 01/18/2021  . Constipation 01/18/2021  . Coronary artery disease    Cath 1/13 left main  . 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 Dyspnea   . Elevated bilirubin 05/14/2018  . Essential  hypertension 01/18/2021  . Hypercoagulable state ([place]) 01/18/2021  . Hyperlipidemia   . Hypertension   . Hypertensive heart disease without CHF   . Hyponatremia 01/17/2012  . Hypothyroidism   . Long term (current) use of anticoagulants 07/14/2013  . Lumbar degenerative disc disease    Prior lumbar laminectomy 12/2009 Dr. Rolena Infante.  Failed epidural steroids.  Currently on gabapentin   . Lumbar disc disease   . Lung nodules    Sees [facility], thought to be benign  . Memory problem 01/18/2021  . Mixed hyperlipidemia 01/18/2021  . Obesity   . Obstructive jaundice 05/14/2018  . Overactive bladder 01/18/2021  . PAF (paroxysmal atrial fibrillation) ([place]) 01/30/2019  . Paroxysmal atrial fibrillation ([place]) 01/30/2019  . Personal history of solitary pulmonary nodule 11/07/2011   Followed in Pulmonary clinic/ [facility]/ [facility]   - PET 11/19/11 indeterminant LUL nodule but slt reduced in size vs prev studies so rec f/u cxr in 2 months (tickle file) Wedge resection by Dr. Servando Snare at time of AVR/CABG 12/28/2011 >   wedge biopsy/resection, Left upper lobe - NON-NECROTIZING GRANULOMATOUS INFLAMMATION WITH ASSOCIATED MULTINUCLEATED GIANT CELLS.   . Polyp of colon 01/18/2021  . S/P AVR and CABG 12/28/2011   19 mm Magna Ease pericardial tissue valve CABG x 3 (LIMA-LAD, SVG-Int, SVG-dRCA)12/28/2011 Dr. Servando Snare    . Sciatica 01/18/2021  . Senile purpura ([place]) 01/18/2021  . Sepsis ([place]) 08/17/2018  . Spinal stenosis   . Villous adenoma of rectum    Past Surgical History:  Procedure Laterality Date  . AORTIC VALVE REPLACEMENT  12/28/2011   Procedure: AORTIC VALVE REPLACEMENT (AVR);  Surgeon: Grace Isaac, MD;  Location: [facility];  Service: Open Heart Surgery;  Laterality: N/A;  . CARDIAC CATHETERIZATION  2013  . CHOLECYSTECTOMY    . CORONARY ARTERY BYPASS GRAFT  12/28/2011   Procedure: CORONARY ARTERY BYPASS GRAFTING (CABG);  Surgeon: Grace Isaac, MD;  Location: [facility];  Service: Open Heart Surgery;  Laterality: N/A;   . HERNIA REPAIR    . LEFT AND RIGHT HEART CATHETERIZATION WITH CORONARY ANGIOGRAM N/A 12/26/2011   Procedure: LEFT AND RIGHT HEART CATHETERIZATION WITH CORONARY ANGIOGRAM;  Surgeon: Jacolyn Reedy, MD;  Location: [facility] CATH LAB;  Service: Cardiovascular;  Laterality: N/A;  . LUMBAR LAMINECTOMY    . PARTIAL COLECTOMY  2008   Villous adenoma    reports that she quit smoking about 42 years ago. Her smoking use included cigarettes. She has a 5.00 pack-year smoking history. She has never used smokeless tobacco. She reports that she does not drink alcohol and does not use drugs. Social History   Socioeconomic History  . Marital status: Widowed    Spouse name: Not on file  . Number of children: 4  . Years of education: 64  . Highest education level: 12th grade  Occupational History  . Occupation: retired Scientist, clinical (histocompatibility and immunogenetics)  Tobacco Use  . Smoking status: Former Smoker    Packs/day: 0.50    Years: 10.00    Pack years: 5.00    Types: Cigarettes    Quit date: 12/24/1978    Years since quitting: 42.1  . Smokeless tobacco: Never Used  Vaping Use  . Vaping Use: Never used  Substance and Sexual Activity  . Alcohol use: No  . Drug use: No  . Sexual activity: Not Currently    Birth control/protection: None  Other Topics Concern  . Not on file  Social History Narrative   Lives at [place]   Phone [phone]   Apt [phone]   Caffeine use: Drinks coffee- 2 cups per day at the most per pt   Social Determinants of Health   Financial Resource Strain: Not on file  Food Insecurity: Not on file  Transportation Needs: Not on file  Physical Activity: Not on file  Stress: Not on file  Social Connections: Not on file  Intimate Partner Violence: Not on file    Functional Status Survey:    Family History  Problem Relation Age of Onset  . Lymphoma Sister   . Cancer Sister   . Breast cancer Sister   . Cancer Mother   . Heart attack Father   . Heart disease Father   . Heart failure  Brother     Health Maintenance  Topic Date Due  . DEXA SCAN  Never done  . COVID-19 Vaccine (3 - Booster for Moderna series) 07/24/2020  . TETANUS/TDAP  10/20/2025  . INFLUENZA VACCINE  Completed  . PNA vac Low Risk Adult  Completed    Allergies  Allergen Reactions  . Ibuprofen Other (See Comments)    Hx of GI bleed  . Tapentadol Other (See Comments)    Nucynta- Knows she "cannot take"- made the patient "very sick"   . Ace Inhibitors Cough  . Angiotensin Receptor Blockers Cough  . Dicloxacillin Nausea Only and Other (See Comments)    Made the patient lightheaded, also  . Doxycycline Nausea Only  . Hydrocodone Nausea Only and Other (See Comments)    Made the patient lightheaded, also  . Nsaids     Can tolerate only Tylenol (has a history of GI BLEEDS)  . Adhesive [Tape] Rash    Blisters (can use only paper tape)  . Latex Rash and Other (See Comments)    Paper tape only    Allergies as of 02/09/2021      Reactions   Ibuprofen Other (See Comments)   Hx of GI bleed   Tapentadol Other (See Comments)   Nucynta- Knows she "cannot take"- made the patient "very sick"   Ace Inhibitors Cough   Angiotensin Receptor Blockers Cough   Dicloxacillin Nausea Only, Other (See Comments)   Made the patient lightheaded, also   Doxycycline Nausea Only   Hydrocodone Nausea Only, Other (See Comments)   Made the patient lightheaded, also   Nsaids    Can tolerate only Tylenol (has a history of GI BLEEDS)   Adhesive [tape] Rash   Blisters (can use only paper tape)   Latex Rash, Other (See Comments)   Paper tape only      Medication List       Accurate as of February 09, 2021 10:06 AM. If you have any questions, ask your nurse or doctor.        apixaban 5 MG Tabs tablet Commonly known as: Eliquis Take 1 tablet (5 mg total) by mouth 2 (two) times daily.   atorvastatin 20 MG tablet Commonly known as: LIPITOR TAKE 1 TABLET BY MOUTH EVERYDAY AT BEDTIME   Besivance 0.6 %  Susp Generic drug: Besifloxacin HCl Place 1 drop into the left eye See admin instructions. Place 1 drop left  eye 4 times daily for 2 days after Avastin eye injections   CALCIUM 600 + D PO Take 1 tablet by mouth every morning.   cetirizine 10 MG tablet Commonly known as: ZYRTEC Take 10 mg by mouth daily in the afternoon.   diphenhydrAMINE 25 mg capsule Commonly known as: BENADRYL Take 25 mg by mouth 2 (two) times daily as needed (or allergic reactions).   docusate sodium 100 MG capsule Commonly known as: COLACE Take 100 mg by mouth daily in the afternoon.   gabapentin 100 MG capsule Commonly known as: NEURONTIN Take 100 mg by mouth 3 (three) times daily.   levothyroxine 88 MCG tablet Commonly known as: SYNTHROID Take 88 mcg by mouth daily before breakfast.   melatonin 3 MG Tabs tablet Take 1 tablet (3 mg total) by mouth at bedtime.   metoprolol tartrate 50 MG tablet Commonly known as: LOPRESSOR Take 1 tablet (50 mg total) by mouth at bedtime.   metoprolol tartrate 100 MG tablet Commonly known as: LOPRESSOR Take 1 tablet (100 mg total) by mouth daily.   Multi For Her 50+ Tabs Take 1 tablet by mouth daily in the afternoon.   nystatin powder Commonly known as: MYCOSTATIN/NYSTOP Apply topically 2 (two) times daily.   oxybutynin 5 MG tablet Commonly known as: DITROPAN Take 5 mg by mouth every other day.   polyethylene glycol 17 g packet Commonly known as: MIRALAX / GLYCOLAX Take 17 g by mouth 2 (two) times daily.   predniSONE 5 MG tablet Commonly known as: DELTASONE Take 5 mg by mouth daily with breakfast.   saccharomyces boulardii 250 MG capsule Commonly known as: FLORASTOR Take 250 mg by mouth 2 (two) times daily.   Santyl ointment Generic drug: collagenase Apply 1 application topically See admin instructions. Apply daily as directed to the affected toe   senna 8.6 MG Tabs tablet Commonly known as: SENOKOT Take 2 tablets (17.2 mg total) by mouth daily.    zinc oxide 20 % ointment Apply 1 application topically as needed for irritation.       Review of Systems  Constitutional: Positive for activity change and appetite change.  HENT: Negative.   Respiratory: Negative.   Cardiovascular: Negative.   Gastrointestinal: Negative.   Genitourinary: Negative.   Musculoskeletal: Positive for gait problem.  Skin: Positive for color change and wound.  Neurological: Positive for weakness.  Psychiatric/Behavioral: Positive for confusion and hallucinations.  All other systems reviewed and are negative.   Vitals:   02/09/21 1001  BP: (!) 151/83  Pulse: 84  Resp: 19  Temp: (!) 97.4 F (36.3 C)  SpO2: 94%  Weight: 137 lb (62.1 kg)  Height: 5\' 4"  (1.626 m)   Body mass index is 23.52 kg/m. Physical Exam Vitals reviewed.  Constitutional:      Comments: Was little Sleepy and tired  HENT:     Head: Normocephalic.     Nose: Nose normal.     Mouth/Throat:     Mouth: Mucous membranes are moist.     Pharynx: Oropharynx is clear.  Eyes:     Pupils: Pupils are equal, round, and reactive to light.  Cardiovascular:     Rate and Rhythm: Normal rate. Rhythm irregular.     Pulses: Normal pulses.  Pulmonary:     Effort: Pulmonary effort is normal.     Comments: Rales in Right lower Lung Abdominal:     General: Abdomen is flat. Bowel sounds are normal.  Musculoskeletal:  General: No swelling.     Cervical back: Neck supple.  Skin:    General: Skin is warm.     Comments: Has multiple Skin tears on arms and Legs But none looked Infected  Neurological:     Mental Status: She is alert.     Comments: Right Sided 2/5 strength in UE and LE Left was 3/5 in UE and LE Speech was slurred Right facial Droop Would respond but slow and confused about the events Sleepy  Psychiatric:        Mood and Affect: Mood normal.        Thought Content: Thought content normal.     Labs reviewed: Basic Metabolic Panel: Recent Labs     01/29/21 0202 01/30/21 0253 01/31/21 1214 02/01/21 0337 02/04/21 0518 02/05/21 0631 02/01/2021 0258  NA 131*   < >  --    < > 134* 136 136  K 3.8   < >  --    < > 3.9 4.0 3.9  CL 98   < >  --    < > 102 102 101  CO2 25   < >  --    < > 23 26 26   GLUCOSE 108*   < >  --    < > 118* 142* 120*  BUN 20   < >  --    < > 33* 27* 19  CREATININE 1.00   < >  --    < > 0.91 0.84 0.75  CALCIUM 8.8*   < >  --    < > 8.7* 8.5* 8.6*  MG 2.1  --  2.0  --   --   --   --    < > = values in this interval not displayed.   Liver Function Tests: Recent Labs    11/28/20 1144 01/27/21 0643  AST 19 22  ALT 14 17  ALKPHOS 50 54  BILITOT 0.8 1.0  PROT 6.3* 6.5  ALBUMIN 3.8 3.8   No results for input(s): LIPASE, AMYLASE in the last 8760 hours. Recent Labs    02/01/21 1317  AMMONIA 17   CBC: Recent Labs    02/02/21 0809 02/04/21 0518 01/28/2021 0258  WBC 14.0* 10.5 10.3  NEUTROABS 11.0* 7.6 7.3  HGB 10.7* 10.2* 10.3*  HCT 33.4* 32.3* 31.4*  MCV 88.4 88.0 87.0  PLT 234 241 269   Cardiac Enzymes: No results for input(s): CKTOTAL, CKMB, CKMBINDEX, TROPONINI in the last 8760 hours. BNP: Invalid input(s): POCBNP Lab Results  Component Value Date   HGBA1C 4.5 (L) 01/28/2021   Lab Results  Component Value Date   TSH 3.494 01/28/2021   Lab Results  Component Value Date   VITAMINB12 1,134 02/05/2017   Lab Results  Component Value Date   FOLATE  01/20/2011    >20.0 (NOTE)  Reference Ranges        Deficient:       0.4 - 3.3 ng/mL        Indeterminate:   3.4 - 5.4 ng/mL        Normal:              > 5.4 ng/mL   Lab Results  Component Value Date   IRON 55 09/12/2011   TIBC 399 09/12/2011   FERRITIN 24 09/12/2011    Imaging and Procedures obtained prior to SNF admission: CT ANGIO HEAD W OR WO CONTRAST  Result Date: 01/28/2021 CLINICAL DATA:  Stroke. TIA. Left cerebellar and basal  ganglia strokes. EXAM: CT ANGIOGRAPHY HEAD AND NECK TECHNIQUE: Multidetector CT imaging of the head and  neck was performed using the standard protocol during bolus administration of intravenous contrast. Multiplanar CT image reconstructions and MIPs were obtained to evaluate the vascular anatomy. Carotid stenosis measurements (when applicable) are obtained utilizing NASCET criteria, using the distal internal carotid diameter as the denominator. CONTRAST:  74mL OMNIPAQUE IOHEXOL 350 MG/ML SOLN COMPARISON:  CT and MRI studies done yesterday. FINDINGS: CT HEAD FINDINGS Brain: Focal low-density now evident in the left cerebellum. No evidence of hemorrhagic transformation. Cerebral hemispheres show a background pattern of extensive chronic small vessel disease of the white matter. The acute infarction in the left basal ganglia and radiating white matter tracts is barely discernible. No hemorrhagic transformation. No new insult. No hydrocephalus or extra-axial collection. Vascular: There is atherosclerotic calcification of the major vessels at the base of the brain. Skull: Negative Sinuses: Clear Orbits: Normal Review of the MIP images confirms the above findings CTA NECK FINDINGS Aortic arch: Aortic atherosclerotic calcification. Branching pattern is normal without flow limiting stenosis. Right carotid system: Common carotid artery shows atherosclerotic plaque but without flow limiting stenosis proximal to the bifurcation. Dense calcified plaque at the carotid bifurcation and ICA bulb. Minimal diameter in the ICA bulb is 1.5 mm. Compared to a more distal cervical ICA of 5 mm, this indicates a 70% stenosis. Left carotid system: Common carotid artery shows scattered plaque but is sufficiently patent to the bifurcation region. Dense calcified plaque at the carotid bifurcation and ICA bulb. Minimal diameter in the ICA bulb is 2.5 mm. Compared to a more distal cervical ICA diameter of 5 mm, this indicates a 50% stenosis. Vertebral arteries: Both subclavian arteries show atherosclerotic disease but without a flow limiting  stenosis. Both vertebral arteries are patent with stenosis of 50% at their origins. Beyond that, the vessels are patent through the cervical region to the foramen magnum. Skeleton: Ordinary cervical spondylosis. Chronic arthropathy at the C1-2 articulation. Other neck: No mass or lymphadenopathy in the neck. Upper chest: Mild scarring at the lung apices.  No active process. Review of the MIP images confirms the above findings CTA HEAD FINDINGS Anterior circulation: Both internal carotid arteries are patent through the skull base and siphon regions. There is calcification in the carotid siphon regions with stenosis estimated at 30% on both sides. The anterior and middle cerebral vessels are patent without proximal stenosis, aneurysm or vascular malformation. No large or medium vessel occlusion identified. Posterior circulation: Both vertebral arteries are patent through the foramen magnum to the basilar. Focal plaque in the left vertebral V4 segment with stenosis of 30%. No basilar stenosis. Major posterior circulation branch vessels are patent. Left PCA receives its supply from the anterior circulation. Venous sinuses: Patent and normal. Anatomic variants: None Review of the MIP images confirms the above findings IMPRESSION: 1. Dense calcified plaque at both carotid bifurcations and ICA bulb regions. 70% stenosis of the ICA bulb on the right. 50% stenosis of the ICA bulb on the left. 2. 50% stenoses at both vertebral artery origins. 30% stenosis of the left vertebral V4 segment. 3. No intracranial large or medium vessel occlusion or correctable proximal stenosis. Aortic Atherosclerosis (ICD10-I70.0). Electronically Signed   By: Nelson Chimes M.D.   On: 01/28/2021 19:56   CT HEAD WO CONTRAST  Result Date: 01/27/2021 CLINICAL DATA:  Right-sided weakness with slurred speech EXAM: CT HEAD WITHOUT CONTRAST TECHNIQUE: Contiguous axial images were obtained from the base of the skull through  the vertex without intravenous  contrast. COMPARISON:  02/19/2017 FINDINGS: Brain: No evidence of acute gray matter infarction, hemorrhage, hydrocephalus, extra-axial collection or mass lesion/mass effect. Confluent chronic small vessel ischemia in the deep cerebral white matter. Increased number of chronic appearing bilateral cerebellar infarcts. Age congruent brain volume Vascular: No hyperdense vessel or unexpected calcification. Skull: Normal. Negative for fracture or focal lesion. Sinuses/Orbits: Bilateral cataract resection. IMPRESSION: 1. No acute finding. 2. Extensive chronic small vessel disease. Electronically Signed   By: Monte Fantasia M.D.   On: 01/27/2021 07:23   CT ANGIO NECK W OR WO CONTRAST  Result Date: 01/28/2021 CLINICAL DATA:  Stroke. TIA. Left cerebellar and basal ganglia strokes. EXAM: CT ANGIOGRAPHY HEAD AND NECK TECHNIQUE: Multidetector CT imaging of the head and neck was performed using the standard protocol during bolus administration of intravenous contrast. Multiplanar CT image reconstructions and MIPs were obtained to evaluate the vascular anatomy. Carotid stenosis measurements (when applicable) are obtained utilizing NASCET criteria, using the distal internal carotid diameter as the denominator. CONTRAST:  53mL OMNIPAQUE IOHEXOL 350 MG/ML SOLN COMPARISON:  CT and MRI studies done yesterday. FINDINGS: CT HEAD FINDINGS Brain: Focal low-density now evident in the left cerebellum. No evidence of hemorrhagic transformation. Cerebral hemispheres show a background pattern of extensive chronic small vessel disease of the white matter. The acute infarction in the left basal ganglia and radiating white matter tracts is barely discernible. No hemorrhagic transformation. No new insult. No hydrocephalus or extra-axial collection. Vascular: There is atherosclerotic calcification of the major vessels at the base of the brain. Skull: Negative Sinuses: Clear Orbits: Normal Review of the MIP images confirms the above findings CTA  NECK FINDINGS Aortic arch: Aortic atherosclerotic calcification. Branching pattern is normal without flow limiting stenosis. Right carotid system: Common carotid artery shows atherosclerotic plaque but without flow limiting stenosis proximal to the bifurcation. Dense calcified plaque at the carotid bifurcation and ICA bulb. Minimal diameter in the ICA bulb is 1.5 mm. Compared to a more distal cervical ICA of 5 mm, this indicates a 70% stenosis. Left carotid system: Common carotid artery shows scattered plaque but is sufficiently patent to the bifurcation region. Dense calcified plaque at the carotid bifurcation and ICA bulb. Minimal diameter in the ICA bulb is 2.5 mm. Compared to a more distal cervical ICA diameter of 5 mm, this indicates a 50% stenosis. Vertebral arteries: Both subclavian arteries show atherosclerotic disease but without a flow limiting stenosis. Both vertebral arteries are patent with stenosis of 50% at their origins. Beyond that, the vessels are patent through the cervical region to the foramen magnum. Skeleton: Ordinary cervical spondylosis. Chronic arthropathy at the C1-2 articulation. Other neck: No mass or lymphadenopathy in the neck. Upper chest: Mild scarring at the lung apices.  No active process. Review of the MIP images confirms the above findings CTA HEAD FINDINGS Anterior circulation: Both internal carotid arteries are patent through the skull base and siphon regions. There is calcification in the carotid siphon regions with stenosis estimated at 30% on both sides. The anterior and middle cerebral vessels are patent without proximal stenosis, aneurysm or vascular malformation. No large or medium vessel occlusion identified. Posterior circulation: Both vertebral arteries are patent through the foramen magnum to the basilar. Focal plaque in the left vertebral V4 segment with stenosis of 30%. No basilar stenosis. Major posterior circulation branch vessels are patent. Left PCA receives its  supply from the anterior circulation. Venous sinuses: Patent and normal. Anatomic variants: None Review of the MIP images confirms  the above findings IMPRESSION: 1. Dense calcified plaque at both carotid bifurcations and ICA bulb regions. 70% stenosis of the ICA bulb on the right. 50% stenosis of the ICA bulb on the left. 2. 50% stenoses at both vertebral artery origins. 30% stenosis of the left vertebral V4 segment. 3. No intracranial large or medium vessel occlusion or correctable proximal stenosis. Aortic Atherosclerosis (ICD10-I70.0). Electronically Signed   By: Nelson Chimes M.D.   On: 01/28/2021 19:56   MR BRAIN WO CONTRAST  Addendum Date: 01/27/2021   ADDENDUM REPORT: 01/27/2021 12:00 ADDENDUM: These results were called by telephone at the time of interpretation on 01/27/2021 at 12:00 pm to provider JOSHUA ZAVITZ , who verbally acknowledged these results. Electronically Signed   By: Pedro Earls M.D.   On: 01/27/2021 12:00   Result Date: 01/27/2021 CLINICAL DATA:  Stroke suspected. EXAM: MRI HEAD WITHOUT CONTRAST TECHNIQUE: Multiplanar, multiecho pulse sequences of the brain and surrounding structures were obtained without intravenous contrast. COMPARISON:  Head CT January 27, 2021. FINDINGS: Brain: Areas of restricted diffusion within the left cerebellar hemisphere and left caudate body extending into the lenticulocapsular region, consistent with acute infarct. No hemorrhage, hydrocephalus, extra-axial collection or mass lesion. Scattered and confluent foci of T2 hyperintensity are seen within the white matter of the cerebral hemispheres, nonspecific, most likely related to chronic microvascular ischemic changes. Remote lacunar infarcts in the bilateral cerebellar hemispheres. Vascular: Normal flow voids. Skull and upper cervical spine: Normal marrow signal. Sinuses/Orbits: Bilateral lens surgery. Paranasal sinuses are clear. IMPRESSION: 1. Areas of restricted diffusion within the left  cerebellar hemisphere and left caudate body extending into the lenticulocapsular region, consistent with acute infarcts. 2. Remote lacunar infarcts in the bilateral cerebellar hemispheres. 3. Moderate chronic microvascular ischemic changes. Electronically Signed: By: Pedro Earls M.D. On: 01/27/2021 11:54   VAS US CAROTID (at [facility] and [facility] only)  Result Date: 01/30/2021 Carotid Arterial Duplex Study Indications:       CVA. Risk Factors:      Hypertension, hyperlipidemia, coronary artery disease. Comparison Study:  01-22-2017 Prior carotid artery duplex study showed 1-39%                    stenosis bilaterally. Performing Technologist: Darlin Coco RDMS  Examination Guidelines: A complete evaluation includes B-mode imaging, spectral Doppler, color Doppler, and power Doppler as needed of all accessible portions of each vessel. Bilateral testing is considered an integral part of a complete examination. Limited examinations for reoccurring indications may be performed as noted.  Right Carotid Findings: +----------+--------+--------+--------+------------------+---------------------+           PSV cm/sEDV cm/sStenosisPlaque DescriptionComments              +----------+--------+--------+--------+------------------+---------------------+ CCA Prox  89      16                                                      +----------+--------+--------+--------+------------------+---------------------+ CCA Distal69      16              calcific                                +----------+--------+--------+--------+------------------+---------------------+ ICA Prox  131     31  1-39%   calcific          Velocities may                                                            underestimate degree                                                      of stenosis due to                                                        more proximal                                                              obstruction.          +----------+--------+--------+--------+------------------+---------------------+ ICA Mid   133     25                                                      +----------+--------+--------+--------+------------------+---------------------+ ICA Distal104     23                                                      +----------+--------+--------+--------+------------------+---------------------+ ECA       246             >50%    calcific                                +----------+--------+--------+--------+------------------+---------------------+ +----------+--------+-------+----------------+-------------------+           PSV cm/sEDV cmsDescribe        Arm Pressure (mmHG) +----------+--------+-------+----------------+-------------------+ [id number]            Multiphasic, WNL                    +----------+--------+-------+----------------+-------------------+ +---------+--------+--+--------+--+---------+ VertebralPSV cm/s44EDV cm/s12Antegrade +---------+--------+--+--------+--+---------+  Left Carotid Findings: +----------+--------+--------+--------+------------------+--------+           PSV cm/sEDV cm/sStenosisPlaque DescriptionComments +----------+--------+--------+--------+------------------+--------+ CCA Prox  101     18                                         +----------+--------+--------+--------+------------------+--------+ CCA Distal74      13              calcific                   +----------+--------+--------+--------+------------------+--------+  ICA Prox  140     18      1-39%   calcific                   +----------+--------+--------+--------+------------------+--------+ ICA Mid   126     23                                         +----------+--------+--------+--------+------------------+--------+ ICA Distal104     20                                          +----------+--------+--------+--------+------------------+--------+ ECA       260             >50%    calcific                   +----------+--------+--------+--------+------------------+--------+ +----------+--------+--------+----------------+-------------------+           PSV cm/sEDV cm/sDescribe        Arm Pressure (mmHG) +----------+--------+--------+----------------+-------------------+ [id number]             Multiphasic, WNL                    +----------+--------+--------+----------------+-------------------+ +---------+--------+--+--------+--+---------+ VertebralPSV cm/s97EDV cm/s15Antegrade +---------+--------+--+--------+--+---------+   Summary: Right Carotid: Velocities in the right ICA are consistent with a 1-39% stenosis.                The ECA appears >50% stenosed. Left Carotid: Velocities in the left ICA are consistent with a 1-39% stenosis.               The ECA appears >50% stenosed. Vertebrals:  Bilateral vertebral arteries demonstrate antegrade flow. Subclavians: Normal flow hemodynamics were seen in bilateral subclavian              arteries. *See table(s) above for measurements and observations.  Electronically signed by Antony Contras MD on 01/30/2021 at 8:49:27 AM.    Final     Assessment/Plan Lethargy ? Confusion ? Etiology Get Labs CBC and CMP Also Ordered Chest Xray Will do Covid test Was negative D/w the daughter in the room. She wants to wait and not transfer to ED yet Did have same symptoms in Hospital Discontinue Melatonin and Benadryl PRN  Acute CVA (cerebrovascular accident) ([place]) Eliquis dose increased Per therapy Flaccid and weak on Right side Needs Full assist On Statin Echo in the hospital was negative for any Emboli   Paroxysmal atrial fibrillation (HCC) On Eliquis  Metoprolol dose was incrased in the hsopital Has h/o Complete Heart Block at night by her monitor Cardiology want conservative approach Hypothyroidism, unspecified  type Normal in 2/22 Hyperlipidemia, unspecified hyperlipidemia type LDL 48 AIHA (autoimmune hemolytic anemia) (HCC) On Prednisone Has Failed Taper before Multiple Skin tears' D/w Wound care Nurse   Family/ staff Communication: Discussed with Daughter in the room  Labs/tests ordered:

## 2021-02-10 ENCOUNTER — Non-Acute Institutional Stay (SKILLED_NURSING_FACILITY): Payer: Medicare Other | Admitting: Nurse Practitioner

## 2021-02-10 ENCOUNTER — Encounter: Payer: Self-pay | Admitting: Nurse Practitioner

## 2021-02-10 DIAGNOSIS — R5383 Other fatigue: Secondary | ICD-10-CM | POA: Diagnosis not present

## 2021-02-10 DIAGNOSIS — I5032 Chronic diastolic (congestive) heart failure: Secondary | ICD-10-CM | POA: Diagnosis not present

## 2021-02-10 DIAGNOSIS — I639 Cerebral infarction, unspecified: Secondary | ICD-10-CM | POA: Diagnosis not present

## 2021-02-10 DIAGNOSIS — D591 Autoimmune hemolytic anemia, unspecified: Secondary | ICD-10-CM | POA: Diagnosis not present

## 2021-02-10 DIAGNOSIS — I2510 Atherosclerotic heart disease of native coronary artery without angina pectoris: Secondary | ICD-10-CM

## 2021-02-10 DIAGNOSIS — N3281 Overactive bladder: Secondary | ICD-10-CM

## 2021-02-10 DIAGNOSIS — E039 Hypothyroidism, unspecified: Secondary | ICD-10-CM

## 2021-02-10 DIAGNOSIS — K5900 Constipation, unspecified: Secondary | ICD-10-CM

## 2021-02-10 DIAGNOSIS — I480 Paroxysmal atrial fibrillation: Secondary | ICD-10-CM

## 2021-02-10 DIAGNOSIS — I951 Orthostatic hypotension: Secondary | ICD-10-CM | POA: Diagnosis not present

## 2021-02-10 DIAGNOSIS — E871 Hypo-osmolality and hyponatremia: Secondary | ICD-10-CM

## 2021-02-10 DIAGNOSIS — M5430 Sciatica, unspecified side: Secondary | ICD-10-CM

## 2021-02-10 DIAGNOSIS — D72829 Elevated white blood cell count, unspecified: Secondary | ICD-10-CM | POA: Insufficient documentation

## 2021-02-10 NOTE — Assessment & Plan Note (Signed)
takes Gabapentin

## 2021-02-10 NOTE — Assessment & Plan Note (Signed)
seems better today, pending CXR

## 2021-02-10 NOTE — Assessment & Plan Note (Signed)
takes Levothyroxicortisol unremarkable ne, TSH 3.494 01/28/21

## 2021-02-10 NOTE — Assessment & Plan Note (Signed)
takes Oxybutynin

## 2021-02-10 NOTE — Assessment & Plan Note (Addendum)
leukocytosis, wbc 13.5, neutrophils 82.5%. afebrile, edema about 1+BLE, appetite is fair. Pending CXR, repeat UA C/S, repeat CBC/diff in one week.  02/10/21 CXR no acute cardiopulmonary disease.  02/12/21 Urine culture no growth.

## 2021-02-10 NOTE — Progress Notes (Addendum)
Location:   SNF [place] Room Number: 32 Place of Service:  SNF (31) Provider: [facility] Florentina Marquart NP  Lujean Amel, MD  Patient Care Team: Lujean Amel, MD as PCP - General (Family Medicine) Revankar, Reita Cliche, MD as PCP - Cardiology (Cardiology) Tanda Rockers, MD (Pulmonary Disease) Jacolyn Reedy, MD as Consulting Physician (Cardiology) Clent Jacks, MD as Consulting Physician (Ophthalmology) Hayden Pedro, MD as Consulting Physician (Ophthalmology) Gardiner Barefoot, DPM as Consulting Physician (Podiatry) Hayden Pedro, MD as Consulting Physician (Ophthalmology) Kathrynn Ducking, MD as Consulting Physician (Neurology) Revankar, Reita Cliche, MD as Consulting Physician (Cardiology)  Extended Emergency Contact Information Primary Emergency Contact: Spaeth,[name] Address: [address]          [place], [place] Work Phone: [phone] Mobile Phone: [phone] Relation: Daughter Preferred language: English Interpreter needed? No  Code Status:  DNR Goals of care: Advanced Directive information Advanced Directives 01/27/2021  Does Patient Have a Medical Advance Directive? Yes  Type of Paramedic of [place];Living will  Does patient want to make changes to medical advance directive? No - Guardian declined  Copy of [place] in Chart? -  Would patient like information on creating a medical advance directive? -  Pre-existing out of facility DNR order (yellow form or pink MOST form) -     Chief Complaint  Patient presents with  . Acute Visit    Low sodium    HPI:  Pt is a 87 y.o. female seen today for an acute visit for hyponatremia, Na 128 02/09/21, leukocytosis, wbc 13.5, neutrophils 82.5%. afebrile, edema about 1+BLE, appetite is fair.      Hospitalized 01/27/21-02/03/2021 for R leg weakness ,slurred speech, she was found to have acute CVA per MRI left cerebral, embolic hemisphere, Eliquis was  increased to 5m bid, takes Atorvastatin.              Lethargy, seems better today,  CXR 02/10/21 no acute cardiopulmonary disease.              Orthostatic hypotension,             Anemia chronic/Autoimmune hemolytic anemia, Hgb 10.2 02/09/21, on Prednisone             Hypothyroidism, takes Levothyroxicortisol unremarkable ne, TSH 3.494 01/28/21             AFib, EF 50-65%, LDL 48. F/u cardiology, takes Metoprolol, Eliquis.              CHF euvolemic orthostatic Bun/creat 24/0.94 02/09/21, EF 50-65%,             CAD Status post CABG in 2013, has history of severe aortic stenosis status post tissue AVR in 2013. Follows with cardiology.             Constipation, takes Senokot, MiraLax, Colace             Urine frequency, takes Oxybutynin             Peripheral neuropathy, takes Gabapentin             Confusion, in hospital, CT head 02/01/21 no acute intracranial abnormality. UTI, acute CVA, underlying dementia may be contributory.     Past Medical History:  Diagnosis Date  . Acute blood loss anemia 05/16/2018  . AIHA (autoimmune hemolytic anemia) (HCC)   . Allergic rhinitis due to pollen 01/18/2021  . Anxiety disorder 01/18/2021  . Aortic stenosis   . Atherosclerotic  heart disease of native coronary artery without angina pectoris 01/18/2021  . Atrial fibrillation ([place])    Noted during recent cardiac surgery and treated with amiodarone   . Autoimmune hemolytic anemia (HCC)   . AV heart block 11/23/2020  . CAD (coronary artery disease) 12/26/2011   Severe ostial LM disease noted at cath 12/26/11   . Chronic diastolic heart failure ([place]) 01/17/2012  . Complication of surgical procedure 01/18/2021  . Constipation 01/18/2021  . Coronary artery disease    Cath 1/13 left main  . 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 Dyspnea   . Elevated bilirubin 05/14/2018  . Essential hypertension 01/18/2021  . Hypercoagulable state ([place]) 01/18/2021  . Hyperlipidemia   . Hypertension   .  Hypertensive heart disease without CHF   . Hyponatremia 01/17/2012  . Hypothyroidism   . Long term (current) use of anticoagulants 07/14/2013  . Lumbar degenerative disc disease    Prior lumbar laminectomy 12/2009 Dr. Rolena Infante.  Failed epidural steroids.  Currently on gabapentin   . Lumbar disc disease   . Lung nodules    Sees [facility], thought to be benign  . Memory problem 01/18/2021  . Mixed hyperlipidemia 01/18/2021  . Obesity   . Obstructive jaundice 05/14/2018  . Overactive bladder 01/18/2021  . PAF (paroxysmal atrial fibrillation) ([place]) 01/30/2019  . Paroxysmal atrial fibrillation ([place]) 01/30/2019  . Personal history of solitary pulmonary nodule 11/07/2011   Followed in Pulmonary clinic/ [facility]/ [facility]   - PET 11/19/11 indeterminant LUL nodule but slt reduced in size vs prev studies so rec f/u cxr in 2 months (tickle file) Wedge resection by Dr. Servando Snare at time of AVR/CABG 12/28/2011 >   wedge biopsy/resection, Left upper lobe - NON-NECROTIZING GRANULOMATOUS INFLAMMATION WITH ASSOCIATED MULTINUCLEATED GIANT CELLS.   . Polyp of colon 01/18/2021  . S/P AVR and CABG 12/28/2011   19 mm Magna Ease pericardial tissue valve CABG x 3 (LIMA-LAD, SVG-Int, SVG-dRCA)12/28/2011 Dr. Servando Snare    . Sciatica 01/18/2021  . Senile purpura ([place]) 01/18/2021  . Sepsis ([place]) 08/17/2018  . Spinal stenosis   . Villous adenoma of rectum    Past Surgical History:  Procedure Laterality Date  . AORTIC VALVE REPLACEMENT  12/28/2011   Procedure: AORTIC VALVE REPLACEMENT (AVR);  Surgeon: Grace Isaac, MD;  Location: [place];  Service: Open Heart Surgery;  Laterality: N/A;  . CARDIAC CATHETERIZATION  2013  . CHOLECYSTECTOMY    . CORONARY ARTERY BYPASS GRAFT  12/28/2011   Procedure: CORONARY ARTERY BYPASS GRAFTING (CABG);  Surgeon: Grace Isaac, MD;  Location: [place];  Service: Open Heart Surgery;  Laterality: N/A;  . HERNIA REPAIR    . LEFT AND RIGHT HEART CATHETERIZATION WITH CORONARY ANGIOGRAM N/A 12/26/2011    Procedure: LEFT AND RIGHT HEART CATHETERIZATION WITH CORONARY ANGIOGRAM;  Surgeon: Jacolyn Reedy, MD;  Location: [facility] CATH LAB;  Service: Cardiovascular;  Laterality: N/A;  . LUMBAR LAMINECTOMY    . PARTIAL COLECTOMY  2008   Villous adenoma    Allergies  Allergen Reactions  . Ibuprofen Other (See Comments)    Hx of GI bleed  . Tapentadol Other (See Comments)    Nucynta- Knows she "cannot take"- made the patient "very sick"   . Ace Inhibitors Cough  . Angiotensin Receptor Blockers Cough  . Dicloxacillin Nausea Only and Other (See Comments)    Made the patient lightheaded, also  . Doxycycline Nausea Only  . Hydrocodone Nausea Only and Other (See Comments)    Made  the patient lightheaded, also  . Nsaids     Can tolerate only Tylenol (has a history of GI BLEEDS)  . Adhesive [Tape] Rash    Blisters (can use only paper tape)  . Latex Rash and Other (See Comments)    Paper tape only    Allergies as of 02/10/2021      Reactions   Ibuprofen Other (See Comments)   Hx of GI bleed   Tapentadol Other (See Comments)   Nucynta- Knows she "cannot take"- made the patient "very sick"   Ace Inhibitors Cough   Angiotensin Receptor Blockers Cough   Dicloxacillin Nausea Only, Other (See Comments)   Made the patient lightheaded, also   Doxycycline Nausea Only   Hydrocodone Nausea Only, Other (See Comments)   Made the patient lightheaded, also   Nsaids    Can tolerate only Tylenol (has a history of GI BLEEDS)   Adhesive [tape] Rash   Blisters (can use only paper tape)   Latex Rash, Other (See Comments)   Paper tape only      Medication List       Accurate as of February 10, 2021 11:59 PM. If you have any questions, ask your nurse or doctor.        STOP taking these medications   diphenhydrAMINE 25 mg capsule Commonly known as: BENADRYL Stopped by: Wallis Vancott X Anshi Jalloh, NP   melatonin 3 MG Tabs tablet Stopped by: Aariah Godette X Ramani Riva, NP     TAKE these medications   apixaban 5 MG Tabs  tablet Commonly known as: Eliquis Take 1 tablet (5 mg total) by mouth 2 (two) times daily.   atorvastatin 20 MG tablet Commonly known as: LIPITOR TAKE 1 TABLET BY MOUTH EVERYDAY AT BEDTIME   Besivance 0.6 % Susp Generic drug: Besifloxacin HCl Place 1 drop into the left eye See admin instructions. Place 1 drop left eye 4 times daily for 2 days after Avastin eye injections   CALCIUM 600 + D PO Take 1 tablet by mouth every morning.   cetirizine 10 MG tablet Commonly known as: ZYRTEC Take 10 mg by mouth daily in the afternoon.   docusate sodium 100 MG capsule Commonly known as: COLACE Take 100 mg by mouth daily in the afternoon.   gabapentin 100 MG capsule Commonly known as: NEURONTIN Take 100 mg by mouth 3 (three) times daily.   levothyroxine 88 MCG tablet Commonly known as: SYNTHROID Take 88 mcg by mouth daily before breakfast.   metoprolol tartrate 50 MG tablet Commonly known as: LOPRESSOR Take 1 tablet (50 mg total) by mouth at bedtime.   metoprolol tartrate 100 MG tablet Commonly known as: LOPRESSOR Take 1 tablet (100 mg total) by mouth daily.   Multi For Her 50+ Tabs Take 1 tablet by mouth daily in the afternoon.   nystatin powder Commonly known as: MYCOSTATIN/NYSTOP Apply topically 2 (two) times daily.   oxybutynin 5 MG tablet Commonly known as: DITROPAN Take 5 mg by mouth every other day.   polyethylene glycol 17 g packet Commonly known as: MIRALAX / GLYCOLAX Take 17 g by mouth 2 (two) times daily.   predniSONE 5 MG tablet Commonly known as: DELTASONE Take 5 mg by mouth daily with breakfast.   saccharomyces boulardii 250 MG capsule Commonly known as: FLORASTOR Take 250 mg by mouth 2 (two) times daily.   Santyl ointment Generic drug: collagenase Apply 1 application topically See admin instructions. Apply daily as directed to the affected toe   senna 8.6 MG  Tabs tablet Commonly known as: SENOKOT Take 2 tablets (17.2 mg total) by mouth daily.    zinc oxide 20 % ointment Apply 1 application topically as needed for irritation.       Review of Systems  Constitutional: Positive for activity change and fatigue. Negative for appetite change, diaphoresis and fever.  HENT: Positive for hearing loss. Negative for congestion, trouble swallowing and voice change.   Respiratory: Negative for cough, shortness of breath and wheezing.   Cardiovascular: Positive for leg swelling. Negative for chest pain and palpitations.  Gastrointestinal: Negative for abdominal pain, constipation, nausea and vomiting.  Genitourinary: Positive for frequency. Negative for dysuria and urgency.  Musculoskeletal: Positive for arthralgias, back pain and gait problem.  Skin:       Ecchymoses all R+L arms.   Neurological: Positive for weakness. Negative for seizures, facial asymmetry, speech difficulty and headaches.       Memory lapses. R leg weakness.   Psychiatric/Behavioral: Negative for agitation, behavioral problems, hallucinations and sleep disturbance. The patient is not nervous/anxious.     Immunization History  Administered Date(s) Administered  . Influenza Split 10/09/2007, 09/06/2009, 09/22/2010, 09/21/2011, 09/15/2012, 09/24/2013, 09/27/2014, 09/28/2015, 09/17/2016, 10/07/2019  . Influenza,inj,Quad PF,6+ Mos 09/18/2017, 09/25/2018, 09/30/2020  . Influenza-Unspecified 08/26/2018, 10/20/2019  . Moderna Sars-Covid-2 Vaccination 12/28/2019, 01/25/2020  . Pneumococcal Conjugate-13 04/21/2014  . Pneumococcal Polysaccharide-23 12/30/2003  . Td 12/30/2003, 10/21/2015  . Zoster 09/12/2006, 08/04/2018, 10/06/2018   Pertinent  Health Maintenance Due  Topic Date Due  . DEXA SCAN  Never done  . INFLUENZA VACCINE  Completed  . PNA vac Low Risk Adult  Completed   No flowsheet data found. Functional Status Survey:    Vitals:   02/10/21 1433  BP: 121/85  Pulse: 94  Resp: 18  Temp: (!) 97 F (36.1 C)  SpO2: 97%  Weight: 137 lb (62.1 kg)  Height: 5'  4" (1.626 m)   Body mass index is 23.52 kg/m. Physical Exam Constitutional:      Appearance: Normal appearance.  HENT:     Head: Normocephalic and atraumatic.     Nose: Nose normal.     Mouth/Throat:     Mouth: Mucous membranes are moist.  Eyes:     Extraocular Movements: Extraocular movements intact.     Conjunctiva/sclera: Conjunctivae normal.     Pupils: Pupils are equal, round, and reactive to light.  Cardiovascular:     Rate and Rhythm: Normal rate and regular rhythm.     Heart sounds: No murmur heard.   Pulmonary:     Effort: Pulmonary effort is normal.     Breath sounds: No wheezing, rhonchi or rales.  Abdominal:     General: Bowel sounds are normal. There is no distension.     Palpations: Abdomen is soft.     Tenderness: There is no abdominal tenderness. There is no right CVA tenderness, left CVA tenderness, guarding or rebound.  Musculoskeletal:     Cervical back: Normal range of motion and neck supple.     Right lower leg: Edema present.     Left lower leg: Edema present.     Comments: Trace edema BLE  Skin:    General: Skin is warm and dry.     Findings: Bruising present.     Comments: Multiple ecchymoses arms, legs. Dark purple discoloration BLE from knee down.   Neurological:     General: No focal deficit present.     Mental Status: She is alert and oriented to person, place, and time.  Mental status is at baseline.     Cranial Nerves: No cranial nerve deficit.     Sensory: No sensory deficit.     Motor: Weakness present.     Coordination: Coordination abnormal.     Gait: Gait abnormal.     Comments: Right leg weakness.   Psychiatric:        Mood and Affect: Mood normal.        Behavior: Behavior normal.        Thought Content: Thought content normal.     Labs reviewed: Recent Labs    01/29/21 0202 01/30/21 0253 01/31/21 1214 02/01/21 0337 02/04/21 0518 02/05/21 0631 02/01/2021 0258 02/09/21 0000  NA 131*   < >  --    < > 134* 136 136 128*  K  3.8   < >  --    < > 3.9 4.0 3.9 4.5  CL 98   < >  --    < > 102 102 101 91*  CO2 25   < >  --    < > '23 26 26 ' 31*  GLUCOSE 108*   < >  --    < > 118* 142* 120*  --   BUN 20   < >  --    < > 33* 27* 19 24*  CREATININE 1.00   < >  --    < > 0.91 0.84 0.75 0.9  CALCIUM 8.8*   < >  --    < > 8.7* 8.5* 8.6* 9.2  MG 2.1  --  2.0  --   --   --   --   --    < > = values in this interval not displayed.   Recent Labs    11/28/20 1144 01/27/21 0643 02/09/21 0000  AST '19 22 21  ' ALT '14 17 17  ' ALKPHOS 50 54 72  BILITOT 0.8 1.0  --   PROT 6.3* 6.5  --   ALBUMIN 3.8 3.8 3.3*   Recent Labs    02/02/21 0809 02/04/21 0518 02/12/2021 0258 02/09/21 0000  WBC 14.0* 10.5 10.3 13.5  NEUTROABS 11.0* 7.6 7.3 11,138.00  HGB 10.7* 10.2* 10.3* 10.2*  HCT 33.4* 32.3* 31.4* 31*  MCV 88.4 88.0 87.0  --   PLT 234 241 269 295   Lab Results  Component Value Date   TSH 3.494 01/28/2021   Lab Results  Component Value Date   HGBA1C 4.5 (L) 01/28/2021   Lab Results  Component Value Date   CHOL 123 01/28/2021   HDL 55 01/28/2021   LDLCALC 48 01/28/2021   TRIG 102 01/28/2021   CHOLHDL 2.2 01/28/2021    Significant Diagnostic Results in last 30 days:  CT ANGIO HEAD W OR WO CONTRAST  Result Date: 01/28/2021 CLINICAL DATA:  Stroke. TIA. Left cerebellar and basal ganglia strokes. EXAM: CT ANGIOGRAPHY HEAD AND NECK TECHNIQUE: Multidetector CT imaging of the head and neck was performed using the standard protocol during bolus administration of intravenous contrast. Multiplanar CT image reconstructions and MIPs were obtained to evaluate the vascular anatomy. Carotid stenosis measurements (when applicable) are obtained utilizing NASCET criteria, using the distal internal carotid diameter as the denominator. CONTRAST:  24m OMNIPAQUE IOHEXOL 350 MG/ML SOLN COMPARISON:  CT and MRI studies done yesterday. FINDINGS: CT HEAD FINDINGS Brain: Focal low-density now evident in the left cerebellum. No evidence of  hemorrhagic transformation. Cerebral hemispheres show a background pattern of extensive chronic small vessel disease of the white matter. The  acute infarction in the left basal ganglia and radiating white matter tracts is barely discernible. No hemorrhagic transformation. No new insult. No hydrocephalus or extra-axial collection. Vascular: There is atherosclerotic calcification of the major vessels at the base of the brain. Skull: Negative Sinuses: Clear Orbits: Normal Review of the MIP images confirms the above findings CTA NECK FINDINGS Aortic arch: Aortic atherosclerotic calcification. Branching pattern is normal without flow limiting stenosis. Right carotid system: Common carotid artery shows atherosclerotic plaque but without flow limiting stenosis proximal to the bifurcation. Dense calcified plaque at the carotid bifurcation and ICA bulb. Minimal diameter in the ICA bulb is 1.5 mm. Compared to a more distal cervical ICA of 5 mm, this indicates a 70% stenosis. Left carotid system: Common carotid artery shows scattered plaque but is sufficiently patent to the bifurcation region. Dense calcified plaque at the carotid bifurcation and ICA bulb. Minimal diameter in the ICA bulb is 2.5 mm. Compared to a more distal cervical ICA diameter of 5 mm, this indicates a 50% stenosis. Vertebral arteries: Both subclavian arteries show atherosclerotic disease but without a flow limiting stenosis. Both vertebral arteries are patent with stenosis of 50% at their origins. Beyond that, the vessels are patent through the cervical region to the foramen magnum. Skeleton: Ordinary cervical spondylosis. Chronic arthropathy at the C1-2 articulation. Other neck: No mass or lymphadenopathy in the neck. Upper chest: Mild scarring at the lung apices.  No active process. Review of the MIP images confirms the above findings CTA HEAD FINDINGS Anterior circulation: Both internal carotid arteries are patent through the skull base and siphon  regions. There is calcification in the carotid siphon regions with stenosis estimated at 30% on both sides. The anterior and middle cerebral vessels are patent without proximal stenosis, aneurysm or vascular malformation. No large or medium vessel occlusion identified. Posterior circulation: Both vertebral arteries are patent through the foramen magnum to the basilar. Focal plaque in the left vertebral V4 segment with stenosis of 30%. No basilar stenosis. Major posterior circulation branch vessels are patent. Left PCA receives its supply from the anterior circulation. Venous sinuses: Patent and normal. Anatomic variants: None Review of the MIP images confirms the above findings IMPRESSION: 1. Dense calcified plaque at both carotid bifurcations and ICA bulb regions. 70% stenosis of the ICA bulb on the right. 50% stenosis of the ICA bulb on the left. 2. 50% stenoses at both vertebral artery origins. 30% stenosis of the left vertebral V4 segment. 3. No intracranial large or medium vessel occlusion or correctable proximal stenosis. Aortic Atherosclerosis (ICD10-I70.0). Electronically Signed   By: Nelson Chimes M.D.   On: 01/28/2021 19:56   DG Abd 1 View  Result Date: 02/05/2021 CLINICAL DATA:  Abdominal distension EXAM: ABDOMEN - 1 VIEW COMPARISON:  February 01, 2021 FINDINGS: No appreciable bowel dilatation or air-fluid level to suggest bowel obstruction. No free air. Lung bases clear. Postoperative changes noted in the upper abdomen. There is aortic and splenic artery atherosclerotic calcification. IMPRESSION: No bowel obstruction or free air. Lung bases clear. Aortic Atherosclerosis (ICD10-I70.0). Electronically Signed   By: Lowella Grip III M.D.   On: 02/05/2021 09:00   DG Abd 1 View  Result Date: 02/01/2021 CLINICAL DATA:  Abdominal pain and distension EXAM: ABDOMEN - 1 VIEW COMPARISON:  CT abdomen 05/14/2018 FINDINGS: Small bowel pattern is unremarkable. Large amount of gas and stool throughout the  colon. Peripheral gas pattern in the rectum presumed to be peripheral gas around impacted stool. The unlikely possibility pneumatosis is not  excluded. Arterial vascular calcification is noted throughout. Clips in the right upper quadrant consistent with previous cholecystectomy. Scoliosis and degenerative changes of the spine. IMPRESSION: Large amount of gas and stool throughout the colon. Peripheral gas pattern in the rectum presumed to be peripheral gas around impacted stool. The unlikely possibility of pneumatosis is not excluded. If there is clinical concern regarding the possibility bowel infarction, consider CT of the abdomen pelvis. Otherwise, the findings could simply be due to constipation. Electronically Signed   By: Nelson Chimes M.D.   On: 02/01/2021 19:26   CT HEAD WO CONTRAST  Result Date: 02/01/2021 CLINICAL DATA:  Delirium. EXAM: CT HEAD WITHOUT CONTRAST TECHNIQUE: Contiguous axial images were obtained from the base of the skull through the vertex without intravenous contrast. COMPARISON:  Noncontrast head CT 01/27/2021. Brain MRI 01/27/2021. CT angiogram head/neck 01/28/2021. FINDINGS: Brain: Moderate cerebral and cerebellar atrophy. Known early subacute infarct within the left corona radiata/basal ganglia. Background moderate multifocal T2/FLAIR hyperintensity within the cerebral white matter is nonspecific compatible with chronic small vessel ischemic disease. Redemonstrated small early subacute infarct within the left cerebellar hemisphere. Superimposed small chronic infarcts within the bilateral cerebellar hemispheres. There is no acute intracranial hemorrhage. No extra-axial fluid collection. No evidence of intracranial mass. No midline shift. Vascular: No hyperdense vessel.  Atherosclerotic calcifications. Skull: Normal. Negative for fracture or focal lesion. Sinuses/Orbits: Visualized orbits show no acute finding. Trace ethmoid sinus mucosal thickening. IMPRESSION: No evidence of interval  acute intracranial abnormality. Known subacute infarcts within the left corona radiata/basal ganglia and left cerebellar hemisphere. Stable background moderate generalized atrophy of the brain and chronic small vessel ischemic disease. Unchanged small remote infarcts within the bilateral cerebellar hemispheres. Electronically Signed   By: Kellie Simmering DO   On: 02/01/2021 14:59   CT HEAD WO CONTRAST  Result Date: 01/27/2021 CLINICAL DATA:  Right-sided weakness with slurred speech EXAM: CT HEAD WITHOUT CONTRAST TECHNIQUE: Contiguous axial images were obtained from the base of the skull through the vertex without intravenous contrast. COMPARISON:  02/19/2017 FINDINGS: Brain: No evidence of acute gray matter infarction, hemorrhage, hydrocephalus, extra-axial collection or mass lesion/mass effect. Confluent chronic small vessel ischemia in the deep cerebral white matter. Increased number of chronic appearing bilateral cerebellar infarcts. Age congruent brain volume Vascular: No hyperdense vessel or unexpected calcification. Skull: Normal. Negative for fracture or focal lesion. Sinuses/Orbits: Bilateral cataract resection. IMPRESSION: 1. No acute finding. 2. Extensive chronic small vessel disease. Electronically Signed   By: Monte Fantasia M.D.   On: 01/27/2021 07:23   CT ANGIO NECK W OR WO CONTRAST  Result Date: 01/28/2021 CLINICAL DATA:  Stroke. TIA. Left cerebellar and basal ganglia strokes. EXAM: CT ANGIOGRAPHY HEAD AND NECK TECHNIQUE: Multidetector CT imaging of the head and neck was performed using the standard protocol during bolus administration of intravenous contrast. Multiplanar CT image reconstructions and MIPs were obtained to evaluate the vascular anatomy. Carotid stenosis measurements (when applicable) are obtained utilizing NASCET criteria, using the distal internal carotid diameter as the denominator. CONTRAST:  62m OMNIPAQUE IOHEXOL 350 MG/ML SOLN COMPARISON:  CT and MRI studies done yesterday.  FINDINGS: CT HEAD FINDINGS Brain: Focal low-density now evident in the left cerebellum. No evidence of hemorrhagic transformation. Cerebral hemispheres show a background pattern of extensive chronic small vessel disease of the white matter. The acute infarction in the left basal ganglia and radiating white matter tracts is barely discernible. No hemorrhagic transformation. No new insult. No hydrocephalus or extra-axial collection. Vascular: There is atherosclerotic calcification of the  major vessels at the base of the brain. Skull: Negative Sinuses: Clear Orbits: Normal Review of the MIP images confirms the above findings CTA NECK FINDINGS Aortic arch: Aortic atherosclerotic calcification. Branching pattern is normal without flow limiting stenosis. Right carotid system: Common carotid artery shows atherosclerotic plaque but without flow limiting stenosis proximal to the bifurcation. Dense calcified plaque at the carotid bifurcation and ICA bulb. Minimal diameter in the ICA bulb is 1.5 mm. Compared to a more distal cervical ICA of 5 mm, this indicates a 70% stenosis. Left carotid system: Common carotid artery shows scattered plaque but is sufficiently patent to the bifurcation region. Dense calcified plaque at the carotid bifurcation and ICA bulb. Minimal diameter in the ICA bulb is 2.5 mm. Compared to a more distal cervical ICA diameter of 5 mm, this indicates a 50% stenosis. Vertebral arteries: Both subclavian arteries show atherosclerotic disease but without a flow limiting stenosis. Both vertebral arteries are patent with stenosis of 50% at their origins. Beyond that, the vessels are patent through the cervical region to the foramen magnum. Skeleton: Ordinary cervical spondylosis. Chronic arthropathy at the C1-2 articulation. Other neck: No mass or lymphadenopathy in the neck. Upper chest: Mild scarring at the lung apices.  No active process. Review of the MIP images confirms the above findings CTA HEAD FINDINGS  Anterior circulation: Both internal carotid arteries are patent through the skull base and siphon regions. There is calcification in the carotid siphon regions with stenosis estimated at 30% on both sides. The anterior and middle cerebral vessels are patent without proximal stenosis, aneurysm or vascular malformation. No large or medium vessel occlusion identified. Posterior circulation: Both vertebral arteries are patent through the foramen magnum to the basilar. Focal plaque in the left vertebral V4 segment with stenosis of 30%. No basilar stenosis. Major posterior circulation branch vessels are patent. Left PCA receives its supply from the anterior circulation. Venous sinuses: Patent and normal. Anatomic variants: None Review of the MIP images confirms the above findings IMPRESSION: 1. Dense calcified plaque at both carotid bifurcations and ICA bulb regions. 70% stenosis of the ICA bulb on the right. 50% stenosis of the ICA bulb on the left. 2. 50% stenoses at both vertebral artery origins. 30% stenosis of the left vertebral V4 segment. 3. No intracranial large or medium vessel occlusion or correctable proximal stenosis. Aortic Atherosclerosis (ICD10-I70.0). Electronically Signed   By: Nelson Chimes M.D.   On: 01/28/2021 19:56   MR BRAIN WO CONTRAST  Result Date: 02/03/2021 CLINICAL DATA:  Delirium.  Recent stroke. EXAM: MRI HEAD WITHOUT CONTRAST TECHNIQUE: Multiplanar, multiecho pulse sequences of the brain and surrounding structures were obtained without intravenous contrast. COMPARISON:  Head CT 02/01/2021 and MRI 01/27/2021 FINDINGS: Brain: Recent infarcts are again noted in the left corona radiata and left cerebellum. The left corona radiata infarct has slightly enlarged from the prior MRI while the cerebellar infarct is unchanged. No new infarct is identified elsewhere. Background patchy to confluent T2 hyperintensities in the cerebral white matter bilaterally are unchanged and nonspecific but compatible  with moderate chronic small-vessel ischemic disease. Chronic bilateral cerebellar infarcts are again noted. There is moderate cerebral atrophy. Vascular: Major intracranial vascular flow voids are preserved. Skull and upper cervical spine: Unremarkable bone marrow signal. Sinuses/Orbits: Bilateral cataract extraction. Paranasal sinuses and mastoid air cells are clear. Other: None. IMPRESSION: 1. Slight enlargement of the left corona radiata infarct since the 01/27/2021 MRI. 2. Unchanged subacute left cerebellar infarct. 3. Moderate chronic small-vessel ischemic disease. Electronically Signed  By: Logan Bores M.D.   On: 02/03/2021 13:04   MR BRAIN WO CONTRAST  Addendum Date: 01/27/2021   ADDENDUM REPORT: 01/27/2021 12:00 ADDENDUM: These results were called by telephone at the time of interpretation on 01/27/2021 at 12:00 pm to provider JOSHUA ZAVITZ , who verbally acknowledged these results. Electronically Signed   By: Pedro Earls M.D.   On: 01/27/2021 12:00   Result Date: 01/27/2021 CLINICAL DATA:  Stroke suspected. EXAM: MRI HEAD WITHOUT CONTRAST TECHNIQUE: Multiplanar, multiecho pulse sequences of the brain and surrounding structures were obtained without intravenous contrast. COMPARISON:  Head CT January 27, 2021. FINDINGS: Brain: Areas of restricted diffusion within the left cerebellar hemisphere and left caudate body extending into the lenticulocapsular region, consistent with acute infarct. No hemorrhage, hydrocephalus, extra-axial collection or mass lesion. Scattered and confluent foci of T2 hyperintensity are seen within the white matter of the cerebral hemispheres, nonspecific, most likely related to chronic microvascular ischemic changes. Remote lacunar infarcts in the bilateral cerebellar hemispheres. Vascular: Normal flow voids. Skull and upper cervical spine: Normal marrow signal. Sinuses/Orbits: Bilateral lens surgery. Paranasal sinuses are clear. IMPRESSION: 1. Areas of  restricted diffusion within the left cerebellar hemisphere and left caudate body extending into the lenticulocapsular region, consistent with acute infarcts. 2. Remote lacunar infarcts in the bilateral cerebellar hemispheres. 3. Moderate chronic microvascular ischemic changes. Electronically Signed: By: Pedro Earls M.D. On: 01/27/2021 11:54   ECHOCARDIOGRAM COMPLETE  Result Date: 01/29/2021    ECHOCARDIOGRAM REPORT   Patient Name:   DELAILA NAND Date of Exam: 01/29/2021 Medical Rec #:  [id number]           Height:       62.0 in Accession #:    [id number]          Weight:       141.8 lb Date of Birth:  1928-12-11            BSA:          1.652 m Patient Age:    44 years            BP:           161/92 mmHg Patient Gender: F                   HR:           116 bpm. Exam Location:  Inpatient Procedure: 2D Echo Indications:    Stroke 434.91 / I163.9  History:        Patient has prior history of Echocardiogram examinations, most                 recent 01/17/2012. CAD, Prior CABG, Stroke, Arrythmias:Atrial                 Fibrillation; Risk Factors:Dyslipidemia, Former Smoker and                 Hypertension. Sepsis, 19 mm Magna Ease pericardial tissue valve                 12/28/2011.                 Aortic Valve: 19 mm Edwards MagnaEase valve is present in the                 aortic position.  Sonographer:    Leavy Cella Referring Phys: Irwin Comments: Restricted Mobility. IMPRESSIONS  1. Left ventricular ejection  fraction, by estimation, is 60 to 65%. The left ventricle has normal function. The left ventricle has no regional wall motion abnormalities. Left ventricular diastolic function could not be evaluated.  2. Right ventricular systolic function is normal. The right ventricular size is normal. There is normal pulmonary artery systolic pressure.  3. The mitral valve is abnormal. Trivial mitral valve regurgitation. Moderate mitral annular calcification.  4. The  aortic valve has been repaired/replaced. Aortic valve regurgitation is trivial. There is a 19 mm Edwards MagnaEase valve present in the aortic position. Echo findings are consistent with normal structure and function of the aortic valve prosthesis. Aortic valve mean gradient measures 14.3 mmHg. Aortic valve Vmax measures 2.44 m/s.  5. The inferior vena cava is normal in size with greater than 50% respiratory variability, suggesting right atrial pressure of 3 mmHg. Comparison(s): Prior images unable to be directly viewed, comparison made by report only. Changes from prior study are noted. 01/17/12: LVEF 55-60%, aortic bioprosthesis -mean gradient 22 mmHg. FINDINGS  Left Ventricle: Left ventricular ejection fraction, by estimation, is 60 to 65%. The left ventricle has normal function. The left ventricle has no regional wall motion abnormalities. The left ventricular internal cavity size was normal in size. There is  no left ventricular hypertrophy. Left ventricular diastolic function could not be evaluated due to atrial fibrillation. Left ventricular diastolic function could not be evaluated. Right Ventricle: The right ventricular size is normal. No increase in right ventricular wall thickness. Right ventricular systolic function is normal. There is normal pulmonary artery systolic pressure. The tricuspid regurgitant velocity is 2.43 m/s, and  with an assumed right atrial pressure of 3 mmHg, the estimated right ventricular systolic pressure is 97.6 mmHg. Left Atrium: Left atrial size was normal in size. Right Atrium: Right atrial size was normal in size. Pericardium: There is no evidence of pericardial effusion. Mitral Valve: The mitral valve is abnormal. There is mild thickening of the mitral valve leaflet(s). Moderate mitral annular calcification. Trivial mitral valve regurgitation. Tricuspid Valve: The tricuspid valve is grossly normal. Tricuspid valve regurgitation is trivial. Aortic Valve: The aortic valve has  been repaired/replaced. Aortic valve regurgitation is trivial. Aortic valve mean gradient measures 14.3 mmHg. Aortic valve peak gradient measures 23.9 mmHg. Aortic valve area, by VTI measures 0.53 cm. There is a 19 mm Edwards MagnaEase valve present in the aortic position. Echo findings are consistent with normal structure and function of the aortic valve prosthesis. Pulmonic Valve: The pulmonic valve was normal in structure. Pulmonic valve regurgitation is trivial. Aorta: The aortic root and ascending aorta are structurally normal, with no evidence of dilitation. Venous: The inferior vena cava is normal in size with greater than 50% respiratory variability, suggesting right atrial pressure of 3 mmHg. IAS/Shunts: No atrial level shunt detected by color flow Doppler.  LEFT VENTRICLE PLAX 2D LVOT diam:     1.90 cm  Diastology LV SV:         23       LV e' medial:   3.70 cm/s LV SV Index:   14       LV E/e' medial: 34.3 LVOT Area:     2.84 cm  RIGHT VENTRICLE RV S prime:     14.20 cm/s TAPSE (M-mode): 1.5 cm LEFT ATRIUM             Index       RIGHT ATRIUM           Index LA diam:  3.60 cm 2.18 cm/m  RA Area:     16.20 cm LA Vol (A2C):   49.1 ml 29.73 ml/m RA Volume:   46.60 ml  28.22 ml/m LA Vol (A4C):   47.9 ml 29.00 ml/m LA Biplane Vol: 53.2 ml 32.21 ml/m  AORTIC VALVE AV Area (Vmax):    0.59 cm AV Area (Vmean):   0.57 cm AV Area (VTI):     0.53 cm AV Vmax:           244.33 cm/s AV Vmean:          174.000 cm/s AV VTI:            0.432 m AV Peak Grad:      23.9 mmHg AV Mean Grad:      14.3 mmHg LVOT Vmax:         51.20 cm/s LVOT Vmean:        35.267 cm/s LVOT VTI:          0.080 m LVOT/AV VTI ratio: 0.19  AORTA Ao Root diam: 2.40 cm MITRAL VALVE                TRICUSPID VALVE MV Area (PHT): 6.71 cm     TR Peak grad:   23.6 mmHg MV Decel Time: 113 msec     TR Vmax:        243.00 cm/s MV E velocity: 127.00 cm/s MV A velocity: 58.10 cm/s   SHUNTS MV E/A ratio:  2.19         Systemic VTI:  0.08 m                              Systemic Diam: 1.90 cm Lyman Bishop MD Electronically signed by Lyman Bishop MD Signature Date/Time: 01/29/2021/12:51:58 PM    Final    VAS US CAROTID (at [facility] and [facility] only)  Result Date: 01/30/2021 Carotid Arterial Duplex Study Indications:       CVA. Risk Factors:      Hypertension, hyperlipidemia, coronary artery disease. Comparison Study:  01-22-2017 Prior carotid artery duplex study showed 1-39%                    stenosis bilaterally. Performing Technologist: Darlin Coco RDMS  Examination Guidelines: A complete evaluation includes B-mode imaging, spectral Doppler, color Doppler, and power Doppler as needed of all accessible portions of each vessel. Bilateral testing is considered an integral part of a complete examination. Limited examinations for reoccurring indications may be performed as noted.  Right Carotid Findings: +----------+--------+--------+--------+------------------+---------------------+           PSV cm/sEDV cm/sStenosisPlaque DescriptionComments              +----------+--------+--------+--------+------------------+---------------------+ CCA Prox  89      16                                                      +----------+--------+--------+--------+------------------+---------------------+ CCA Distal69      16              calcific                                +----------+--------+--------+--------+------------------+---------------------+ ICA Prox  131  31      1-39%   calcific          Velocities may                                                            underestimate degree                                                      of stenosis due to                                                        more proximal                                                             obstruction.          +----------+--------+--------+--------+------------------+---------------------+ ICA Mid   133     25                                                       +----------+--------+--------+--------+------------------+---------------------+ ICA Distal104     23                                                      +----------+--------+--------+--------+------------------+---------------------+ ECA       246             >50%    calcific                                +----------+--------+--------+--------+------------------+---------------------+ +----------+--------+-------+----------------+-------------------+           PSV cm/sEDV cmsDescribe        Arm Pressure (mmHG) +----------+--------+-------+----------------+-------------------+ [id number]            Multiphasic, WNL                    +----------+--------+-------+----------------+-------------------+ +---------+--------+--+--------+--+---------+ VertebralPSV cm/s44EDV cm/s12Antegrade +---------+--------+--+--------+--+---------+  Left Carotid Findings: +----------+--------+--------+--------+------------------+--------+           PSV cm/sEDV cm/sStenosisPlaque DescriptionComments +----------+--------+--------+--------+------------------+--------+ CCA Prox  101     18                                         +----------+--------+--------+--------+------------------+--------+ CCA Distal74      13  calcific                   +----------+--------+--------+--------+------------------+--------+ ICA Prox  140     18      1-39%   calcific                   +----------+--------+--------+--------+------------------+--------+ ICA Mid   126     23                                         +----------+--------+--------+--------+------------------+--------+ ICA Distal104     20                                         +----------+--------+--------+--------+------------------+--------+ ECA       260             >50%    calcific                    +----------+--------+--------+--------+------------------+--------+ +----------+--------+--------+----------------+-------------------+           PSV cm/sEDV cm/sDescribe        Arm Pressure (mmHG) +----------+--------+--------+----------------+-------------------+ [id number]             Multiphasic, WNL                    +----------+--------+--------+----------------+-------------------+ +---------+--------+--+--------+--+---------+ VertebralPSV cm/s97EDV cm/s15Antegrade +---------+--------+--+--------+--+---------+   Summary: Right Carotid: Velocities in the right ICA are consistent with a 1-39% stenosis.                The ECA appears >50% stenosed. Left Carotid: Velocities in the left ICA are consistent with a 1-39% stenosis.               The ECA appears >50% stenosed. Vertebrals:  Bilateral vertebral arteries demonstrate antegrade flow. Subclavians: Normal flow hemodynamics were seen in bilateral subclavian              arteries. *See table(s) above for measurements and observations.  Electronically signed by Antony Contras MD on 01/30/2021 at 8:49:27 AM.    Final     Assessment/Plan Hyponatremia Na 128 02/09/21, adding salt tab 1gm qd, fluid restriction 1524m per day, CMP/eGFR 1 week.   Leukocytosis leukocytosis, wbc 13.5, neutrophils 82.5%. afebrile, edema about 1+BLE, appetite is fair. Pending CXR, repeat UA C/S, repeat CBC/diff in one week.  02/10/21 CXR no acute cardiopulmonary disease.  02/12/21 Urine culture no growth.   Acute CVA (cerebrovascular accident) ([facility] Hospitalized 01/27/21-01/28/2021 for R leg weakness ,slurred speech, she was found to have acute CVA per MRI left cerebral, embolic hemisphere, Eliquis was increased to 58mbid, takes Atorvastatin.   Lethargy  seems better today, pending CXR   Orthostatic hypotension Orthostatic hypotension,   Autoimmune hemolytic anemia (HCC) Hgb 10.2 02/09/21, on Prednisone   Hypothyroidism takes Levothyroxicortisol  unremarkable ne, TSH 3.494 01/28/21   Atrial fibrillation (HCC) EF 50-65%, LDL 48. F/u cardiology, takes Metoprolol, Eliquis.    Chronic diastolic heart failure (HCC) euvolemic orthostatic Bun/creat 24/0.94 02/09/21, EF 50-65%,   CAD (coronary artery disease) Status post CABG in 2013, has history of severe aortic stenosis status post tissue AVR in 2013. Follows with cardiology  Constipation takes Senokot, MiraLax, Colace   Overactive bladder takes Oxybutynin  Sciatica takes Gabapentin     Family/ staff Communication: plan  of care reviewed with the patient and charge nurse.   Labs/tests ordered: UA C/S, CBC/diff, CMP/eGFR  Time spend 35 minutes.

## 2021-02-10 NOTE — Assessment & Plan Note (Signed)
takes Senokot, MiraLax, Colace

## 2021-02-10 NOTE — Assessment & Plan Note (Addendum)
Na 128 02/09/21, adding salt tab 1gm qd, fluid restriction 1587m per day, CMP/eGFR 1 week.

## 2021-02-10 NOTE — Assessment & Plan Note (Signed)
Status post CABG in 2013, has history of severe aortic stenosis status post tissue AVR in 2013. Follows with cardiology

## 2021-02-10 NOTE — Assessment & Plan Note (Signed)
Orthostatic hypotension,

## 2021-02-10 NOTE — Assessment & Plan Note (Signed)
euvolemic orthostatic Bun/creat 24/0.94 02/09/21, EF 50-65%,

## 2021-02-10 NOTE — Assessment & Plan Note (Signed)
EF 50-65%, LDL 48. F/u cardiology, takes Metoprolol, Eliquis.

## 2021-02-10 NOTE — Assessment & Plan Note (Signed)
Hgb 10.2 02/09/21, on Prednisone

## 2021-02-10 NOTE — Assessment & Plan Note (Signed)
Hospitalized 01/27/21-02/20/2021 for R leg weakness ,slurred speech, she was found to have acute CVA per MRI left cerebral, embolic hemisphere, Eliquis was increased to 5mg  bid, takes Atorvastatin.

## 2021-02-13 ENCOUNTER — Encounter: Payer: Self-pay | Admitting: Nurse Practitioner

## 2021-02-13 LAB — BASIC METABOLIC PANEL WITH GFR
BUN: 19 (ref 4–21)
CO2: 31 — AB (ref 13–22)
Chloride: 95 — AB (ref 99–108)
Creatinine: 0.8 (ref 0.5–1.1)
Glucose: 91
Potassium: 4.1 (ref 3.4–5.3)
Sodium: 132 — AB (ref 137–147)

## 2021-02-13 LAB — CBC: RBC: 3.29 — AB (ref 3.87–5.11)

## 2021-02-13 LAB — CBC AND DIFFERENTIAL
HCT: 29 — AB (ref 36–46)
Hemoglobin: 9.4 — AB (ref 12.0–16.0)
Neutrophils Absolute: 8494
Platelets: 281 (ref 150–399)
WBC: 11.7

## 2021-02-13 LAB — HEPATIC FUNCTION PANEL
ALT: 11 (ref 7–35)
AST: 17 (ref 13–35)
Alkaline Phosphatase: 58 (ref 25–125)
Bilirubin, Total: 0.9

## 2021-02-13 LAB — COMPREHENSIVE METABOLIC PANEL WITH GFR
Albumin: 3 — AB (ref 3.5–5.0)
Calcium: 9 (ref 8.7–10.7)
Globulin: 1.9

## 2021-02-14 ENCOUNTER — Other Ambulatory Visit: Payer: Self-pay | Admitting: Pharmacist

## 2021-02-16 LAB — CBC AND DIFFERENTIAL
HCT: 24 — AB (ref 36–46)
Hemoglobin: 8.2 — AB (ref 12.0–16.0)
Neutrophils Absolute: 7373
Platelets: 252 (ref 150–399)
WBC: 10.1

## 2021-02-16 LAB — BASIC METABOLIC PANEL
BUN: 22 — AB (ref 4–21)
CO2: 29 — AB (ref 13–22)
Chloride: 97 — AB (ref 99–108)
Creatinine: 0.7 (ref 0.5–1.1)
Glucose: 98
Potassium: 4 (ref 3.4–5.3)
Sodium: 134 — AB (ref 137–147)

## 2021-02-16 LAB — COMPREHENSIVE METABOLIC PANEL
Albumin: 3 — AB (ref 3.5–5.0)
Calcium: 8.8 (ref 8.7–10.7)
Globulin: 1.8

## 2021-02-16 LAB — HEPATIC FUNCTION PANEL
ALT: 11 (ref 7–35)
AST: 15 (ref 13–35)
Alkaline Phosphatase: 55 (ref 25–125)
Bilirubin, Total: 0.5

## 2021-02-16 LAB — CBC: RBC: 2.84 — AB (ref 3.87–5.11)

## 2021-02-17 ENCOUNTER — Non-Acute Institutional Stay (SKILLED_NURSING_FACILITY): Payer: Medicare Other | Admitting: Internal Medicine

## 2021-02-17 ENCOUNTER — Encounter: Payer: Self-pay | Admitting: Internal Medicine

## 2021-02-17 DIAGNOSIS — D591 Autoimmune hemolytic anemia, unspecified: Secondary | ICD-10-CM | POA: Diagnosis not present

## 2021-02-17 DIAGNOSIS — I639 Cerebral infarction, unspecified: Secondary | ICD-10-CM | POA: Diagnosis not present

## 2021-02-17 DIAGNOSIS — I480 Paroxysmal atrial fibrillation: Secondary | ICD-10-CM | POA: Diagnosis not present

## 2021-02-17 DIAGNOSIS — E871 Hypo-osmolality and hyponatremia: Secondary | ICD-10-CM | POA: Diagnosis not present

## 2021-02-17 NOTE — Progress Notes (Signed)
Location:    [facility] Room Number: 32 Place of Service:  SNF ([phone]) Provider:  Veleta Miners MD  Carol Amel, MD  Patient Care Team: Carol Amel, MD as PCP - General (Family Medicine) Revankar, Reita Cliche, MD as PCP - Cardiology (Cardiology) Tanda Rockers, MD (Pulmonary Disease) Jacolyn Reedy, MD as Consulting Physician (Cardiology) Clent Jacks, MD as Consulting Physician (Ophthalmology) Hayden Pedro, MD as Consulting Physician (Ophthalmology) Gardiner Barefoot, DPM as Consulting Physician (Podiatry) Hayden Pedro, MD as Consulting Physician (Ophthalmology) Kathrynn Ducking, MD as Consulting Physician (Neurology) Revankar, Reita Cliche, MD as Consulting Physician (Cardiology)  Extended Emergency Contact Information Primary Emergency Contact: Carol [name],Carol [name] Address: [address]          [place], [place] Work Phone: [phone] Mobile Phone: [phone] Relation: Daughter Preferred language: English Interpreter needed? No  Code Status:  DNR Goals of care: Advanced Directive information Advanced Directives 01/27/2021  Does Patient Have a Medical Advance Directive? Yes  Type of Paramedic of [place];Living will  Does patient want to make changes to medical advance directive? No - Guardian declined  Copy of [place] in Chart? -  Would patient like information on creating a medical advance directive? -  Pre-existing out of facility DNR order (yellow form or pink MOST form) -     Chief Complaint  Patient presents with  . Acute Visit    Anemia    HPI:  Pt is a 87 y.o. female seen today for an acute visit for Anemia and Sleepiness, Confusion   Patient was admitted in the hospital from 2/4-2/14 with acute nonhemorrhagic CVA,and  metabolic encephalopathy Her stroke was thought to be embolic.  Her Eliquis was increased to 5 mg twice daily  Patient has a history of  paroxysmal A. fib on was on Eliquis, hypertension History of advanced AV block mostly at night, carotid stenosis, S/P AVR History of AIHA on chronic prednisone  Anemia Patient had regular Follow up labs done and Her Hgb is 8.2 Down from 9.4 a week ago Patient does have h/o AIHA is on Chronic Prednisone and has appointment with Hematology. No Signs of Overt bleeding Thought her Eliquis was increased this Hospitalization S/P CVA Continues to be weak Sleepy Confused ? Hallucinations Needing Complete Assist with her ADLS Not much Progress with Therapy Appetite is good and Weight is good Per Nurses her BP has been on Lower side   Past Medical History:  Diagnosis Date  . Acute blood loss anemia 05/16/2018  . AIHA (autoimmune hemolytic anemia) (HCC)   . Allergic rhinitis due to pollen 01/18/2021  . Anxiety disorder 01/18/2021  . Aortic stenosis   . Atherosclerotic heart disease of native coronary artery without angina pectoris 01/18/2021  . Atrial fibrillation ([place])    Noted during recent cardiac surgery and treated with amiodarone   . Autoimmune hemolytic anemia (HCC)   . AV heart block 11/23/2020  . CAD (coronary artery disease) 12/26/2011   Severe ostial LM disease noted at cath 12/26/11   . Chronic diastolic heart failure ([place]) 01/17/2012  . Complication of surgical procedure 01/18/2021  . Constipation 01/18/2021  . Coronary artery disease    Cath 1/13 left main  . 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 Dyspnea   . Elevated bilirubin 05/14/2018  . Essential hypertension 01/18/2021  . Hypercoagulable state ([place]) 01/18/2021  . Hyperlipidemia   . Hypertension   . Hypertensive heart disease  without CHF   . Hyponatremia 01/17/2012  . Hypothyroidism   . Long term (current) use of anticoagulants 07/14/2013  . Lumbar degenerative disc disease    Prior lumbar laminectomy 12/2009 Dr. Rolena Infante.  Failed epidural steroids.  Currently on gabapentin   . Lumbar disc disease   . Lung  nodules    Sees [facility], thought to be benign  . Memory problem 01/18/2021  . Mixed hyperlipidemia 01/18/2021  . Obesity   . Obstructive jaundice 05/14/2018  . Overactive bladder 01/18/2021  . PAF (paroxysmal atrial fibrillation) ([place]) 01/30/2019  . Paroxysmal atrial fibrillation ([place]) 01/30/2019  . Personal history of solitary pulmonary nodule 11/07/2011   Followed in Pulmonary clinic/ [facility]/ [facility]   - PET 11/19/11 indeterminant LUL nodule but slt reduced in size vs prev studies so rec f/u cxr in 2 months (tickle file) Wedge resection by Dr. Servando Snare at time of AVR/CABG 12/28/2011 >   wedge biopsy/resection, Left upper lobe - NON-NECROTIZING GRANULOMATOUS INFLAMMATION WITH ASSOCIATED MULTINUCLEATED GIANT CELLS.   . Polyp of colon 01/18/2021  . S/P AVR and CABG 12/28/2011   19 mm Magna Ease pericardial tissue valve CABG x 3 (LIMA-LAD, SVG-Int, SVG-dRCA)12/28/2011 Dr. Servando Snare    . Sciatica 01/18/2021  . Senile purpura ([place]) 01/18/2021  . Sepsis ([place]) 08/17/2018  . Spinal stenosis   . Villous adenoma of rectum    Past Surgical History:  Procedure Laterality Date  . AORTIC VALVE REPLACEMENT  12/28/2011   Procedure: AORTIC VALVE REPLACEMENT (AVR);  Surgeon: Grace Isaac, MD;  Location: [facility];  Service: Open Heart Surgery;  Laterality: N/A;  . CARDIAC CATHETERIZATION  2013  . CHOLECYSTECTOMY    . CORONARY ARTERY BYPASS GRAFT  12/28/2011   Procedure: CORONARY ARTERY BYPASS GRAFTING (CABG);  Surgeon: Grace Isaac, MD;  Location: [facility];  Service: Open Heart Surgery;  Laterality: N/A;  . HERNIA REPAIR    . LEFT AND RIGHT HEART CATHETERIZATION WITH CORONARY ANGIOGRAM N/A 12/26/2011   Procedure: LEFT AND RIGHT HEART CATHETERIZATION WITH CORONARY ANGIOGRAM;  Surgeon: Jacolyn Reedy, MD;  Location: [facility] CATH [facility];  Service: Cardiovascular;  Laterality: N/A;  . LUMBAR LAMINECTOMY    . PARTIAL COLECTOMY  2008   Villous adenoma    Allergies  Allergen Reactions  . Ibuprofen Other (See Comments)     Hx of GI bleed  . Tapentadol Other (See Comments)    Nucynta- Knows she "cannot take"- made the patient "very sick"   . Ace Inhibitors Cough  . Angiotensin Receptor Blockers Cough  . Dicloxacillin Nausea Only and Other (See Comments)    Made the patient lightheaded, also  . Doxycycline Nausea Only  . Hydrocodone Nausea Only and Other (See Comments)    Made the patient lightheaded, also  . Nsaids     Can tolerate only Tylenol (has a history of GI BLEEDS)  . Adhesive [Tape] Rash    Blisters (can use only paper tape)  . Latex Rash and Other (See Comments)    Paper tape only    Allergies as of 02/17/2021      Reactions   Ibuprofen Other (See Comments)   Hx of GI bleed   Tapentadol Other (See Comments)   Nucynta- Knows she "cannot take"- made the patient "very sick"   Ace Inhibitors Cough   Angiotensin Receptor Blockers Cough   Dicloxacillin Nausea Only, Other (See Comments)   Made the patient lightheaded, also   Doxycycline Nausea Only   Hydrocodone Nausea Only, Other (See Comments)  Made the patient lightheaded, also   Nsaids    Can tolerate only Tylenol (has a history of GI BLEEDS)   Adhesive [tape] Rash   Blisters (can use only paper tape)   Latex Rash, Other (See Comments)   Paper tape only      Medication List       Accurate as of February 17, 2021  2:40 PM. If you have any questions, ask your nurse or doctor.        STOP taking these medications   saccharomyces boulardii 250 MG capsule Commonly known as: FLORASTOR Stopped by: Virgie Dad, MD     TAKE these medications   apixaban 5 MG Tabs tablet Commonly known as: Eliquis Take 1 tablet (5 mg total) by mouth 2 (two) times daily.   atorvastatin 20 MG tablet Commonly known as: LIPITOR TAKE 1 TABLET BY MOUTH EVERYDAY AT BEDTIME   Besivance 0.6 % Susp Generic drug: Besifloxacin HCl Place 1 drop into the left eye See admin instructions. Place 1 drop left eye 4 times daily for 2 days after Avastin eye  injections   CALCIUM 600 + D PO Take 1 tablet by mouth every morning.   cetirizine 10 MG tablet Commonly known as: ZYRTEC Take 10 mg by mouth daily in the afternoon.   docusate sodium 100 MG capsule Commonly known as: COLACE Take 100 mg by mouth daily in the afternoon.   gabapentin 100 MG capsule Commonly known as: NEURONTIN Take 100 mg by mouth 3 (three) times daily.   levothyroxine 88 MCG tablet Commonly known as: SYNTHROID Take 88 mcg by mouth daily before breakfast.   metoprolol tartrate 50 MG tablet Commonly known as: LOPRESSOR Take 1 tablet (50 mg total) by mouth at bedtime.   metoprolol tartrate 100 MG tablet Commonly known as: LOPRESSOR Take 1 tablet (100 mg total) by mouth daily.   Multi For Her 50+ Tabs Take 1 tablet by mouth daily in the afternoon.   nystatin powder Commonly known as: MYCOSTATIN/NYSTOP Apply topically 2 (two) times daily.   oxybutynin 5 MG tablet Commonly known as: DITROPAN Take 5 mg by mouth every other day.   polyethylene glycol 17 g packet Commonly known as: MIRALAX / GLYCOLAX Take 17 g by mouth 2 (two) times daily.   predniSONE 5 MG tablet Commonly known as: DELTASONE Take 5 mg by mouth daily with breakfast.   Santyl ointment Generic drug: collagenase Apply 1 application topically See admin instructions. Apply daily as directed to the affected toe   senna 8.6 MG Tabs tablet Commonly known as: SENOKOT Take 2 tablets (17.2 mg total) by mouth daily.   zinc oxide 20 % ointment Apply 1 application topically as needed for irritation.       Review of Systems  Unable to Review. Patient sleepy and COnfused  Immunization History  Administered Date(s) Administered  . Influenza Split 10/09/2007, 09/06/2009, 09/22/2010, 09/21/2011, 09/15/2012, 09/24/2013, 09/27/2014, 09/28/2015, 09/17/2016, 10/07/2019  . Influenza,inj,Quad PF,6+ Mos 09/18/2017, 09/25/2018, 09/30/2020  . Influenza-Unspecified 08/26/2018, 10/20/2019  . Moderna  Sars-Covid-2 Vaccination 12/28/2019, 01/25/2020  . Pneumococcal Conjugate-13 04/21/2014  . Pneumococcal Polysaccharide-23 12/30/2003  . Td 12/30/2003, 10/21/2015  . Zoster 09/12/2006, 08/04/2018, 10/06/2018   Pertinent  Health Maintenance Due  Topic Date Due  . DEXA SCAN  Never done  . INFLUENZA VACCINE  Completed  . PNA vac Low Risk Adult  Completed   No flowsheet data found. Functional Status Survey:    Vitals:   02/17/21 1431  BP: 132/69  Pulse: 69  Resp: 20  Temp: (!) 97.2 F (36.2 C)  SpO2: 95%  Weight: 141 lb 1.6 oz (64 kg)  Height: 5\' 4"  (1.626 m)   Body mass index is 24.22 kg/m. Physical Exam Vitals reviewed.  Constitutional:      Appearance: Normal appearance.     Comments: Sleepy  HENT:     Head: Normocephalic.     Nose: Nose normal.     Mouth/Throat:     Mouth: Mucous membranes are moist.     Pharynx: Oropharynx is clear.  Eyes:     Pupils: Pupils are equal, round, and reactive to light.  Cardiovascular:     Rate and Rhythm: Normal rate and regular rhythm.     Pulses: Normal pulses.  Pulmonary:     Effort: Pulmonary effort is normal.     Breath sounds: Normal breath sounds.  Abdominal:     General: Abdomen is flat. Bowel sounds are normal.     Palpations: Abdomen is soft.  Musculoskeletal:        General: No swelling.     Cervical back: Neck supple.     Comments: Does have Number of Skin tears  Skin:    General: Skin is warm.  Neurological:     Comments: Right Sided 2/5 strength in UE and LE Left was 3/5 in UE and LE Speech is slurred Right facial Droop Would respond but slow   Psychiatric:        Mood and Affect: Mood normal.        Thought Content: Thought content normal.     Labs reviewed: Recent Labs    01/29/21 0202 01/30/21 0253 01/31/21 1214 02/01/21 0337 02/04/21 0518 02/05/21 0631 01/28/2021 0258 02/09/21 0000 02/13/21 0000  NA 131*   < >  --    < > 134* 136 136 128* 132*  K 3.8   < >  --    < > 3.9 4.0 3.9 4.5 4.1   CL 98   < >  --    < > 102 102 101 91* 95*  CO2 25   < >  --    < > 23 26 26  31* 31*  GLUCOSE 108*   < >  --    < > 118* 142* 120*  --   --   BUN 20   < >  --    < > 33* 27* 19 24* 19  CREATININE 1.00   < >  --    < > 0.91 0.84 0.75 0.9 0.8  CALCIUM 8.8*   < >  --    < > 8.7* 8.5* 8.6* 9.2 9.0  MG 2.1  --  2.0  --   --   --   --   --   --    < > = values in this interval not displayed.   Recent Labs    11/28/20 1144 01/27/21 0643 02/09/21 0000 02/13/21 0000  AST 19 22 21 17   ALT 14 17 17 11   ALKPHOS 50 54 72 58  BILITOT 0.8 1.0  --   --   PROT 6.3* 6.5  --   --   ALBUMIN 3.8 3.8 3.3* 3.0*   Recent Labs    02/02/21 0809 02/04/21 0518 02/02/2021 0258 02/09/21 0000 02/13/21 0000  WBC 14.0* 10.5 10.3 13.5 11.7  NEUTROABS 11.0* 7.6 7.3 11,138.00 8,494.00  HGB 10.7* 10.2* 10.3* 10.2* 9.4*  HCT 33.4* 32.3* 31.4* 31* 29*  MCV 88.4  88.0 87.0  --   --   PLT 234 241 269 295 281   Lab Results  Component Value Date   TSH 3.494 01/28/2021   Lab Results  Component Value Date   HGBA1C 4.5 (L) 01/28/2021   Lab Results  Component Value Date   CHOL 123 01/28/2021   HDL 55 01/28/2021   LDLCALC 48 01/28/2021   TRIG 102 01/28/2021   CHOLHDL 2.2 01/28/2021    Significant Diagnostic Results in last 30 days:  CT ANGIO HEAD W OR WO CONTRAST  Result Date: 01/28/2021 CLINICAL DATA:  Stroke. TIA. Left cerebellar and basal ganglia strokes. EXAM: CT ANGIOGRAPHY HEAD AND NECK TECHNIQUE: Multidetector CT imaging of the head and neck was performed using the standard protocol during bolus administration of intravenous contrast. Multiplanar CT image reconstructions and MIPs were obtained to evaluate the vascular anatomy. Carotid stenosis measurements (when applicable) are obtained utilizing NASCET criteria, using the distal internal carotid diameter as the denominator. CONTRAST:  36mL OMNIPAQUE IOHEXOL 350 MG/ML SOLN COMPARISON:  CT and MRI studies done yesterday. FINDINGS: CT HEAD FINDINGS Brain:  Focal low-density now evident in the left cerebellum. No evidence of hemorrhagic transformation. Cerebral hemispheres show a background pattern of extensive chronic small vessel disease of the white matter. The acute infarction in the left basal ganglia and radiating white matter tracts is barely discernible. No hemorrhagic transformation. No new insult. No hydrocephalus or extra-axial collection. Vascular: There is atherosclerotic calcification of the major vessels at the base of the brain. Skull: Negative Sinuses: Clear Orbits: Normal Review of the MIP images confirms the above findings CTA NECK FINDINGS Aortic arch: Aortic atherosclerotic calcification. Branching pattern is normal without flow limiting stenosis. Right carotid system: Common carotid artery shows atherosclerotic plaque but without flow limiting stenosis proximal to the bifurcation. Dense calcified plaque at the carotid bifurcation and ICA bulb. Minimal diameter in the ICA bulb is 1.5 mm. Compared to a more distal cervical ICA of 5 mm, this indicates a 70% stenosis. Left carotid system: Common carotid artery shows scattered plaque but is sufficiently patent to the bifurcation region. Dense calcified plaque at the carotid bifurcation and ICA bulb. Minimal diameter in the ICA bulb is 2.5 mm. Compared to a more distal cervical ICA diameter of 5 mm, this indicates a 50% stenosis. Vertebral arteries: Both subclavian arteries show atherosclerotic disease but without a flow limiting stenosis. Both vertebral arteries are patent with stenosis of 50% at their origins. Beyond that, the vessels are patent through the cervical region to the foramen magnum. Skeleton: Ordinary cervical spondylosis. Chronic arthropathy at the C1-2 articulation. Other neck: No mass or lymphadenopathy in the neck. Upper chest: Mild scarring at the lung apices.  No active process. Review of the MIP images confirms the above findings CTA HEAD FINDINGS Anterior circulation: Both  internal carotid arteries are patent through the skull base and siphon regions. There is calcification in the carotid siphon regions with stenosis estimated at 30% on both sides. The anterior and middle cerebral vessels are patent without proximal stenosis, aneurysm or vascular malformation. No large or medium vessel occlusion identified. Posterior circulation: Both vertebral arteries are patent through the foramen magnum to the basilar. Focal plaque in the left vertebral V4 segment with stenosis of 30%. No basilar stenosis. Major posterior circulation branch vessels are patent. Left PCA receives its supply from the anterior circulation. Venous sinuses: Patent and normal. Anatomic variants: None Review of the MIP images confirms the above findings IMPRESSION: 1. Dense calcified plaque at  both carotid bifurcations and ICA bulb regions. 70% stenosis of the ICA bulb on the right. 50% stenosis of the ICA bulb on the left. 2. 50% stenoses at both vertebral artery origins. 30% stenosis of the left vertebral V4 segment. 3. No intracranial large or medium vessel occlusion or correctable proximal stenosis. Aortic Atherosclerosis (ICD10-I70.0). Electronically Signed   By: Nelson Chimes M.D.   On: 01/28/2021 19:56   DG Abd 1 View  Result Date: 02/05/2021 CLINICAL DATA:  Abdominal distension EXAM: ABDOMEN - 1 VIEW COMPARISON:  February 01, 2021 FINDINGS: No appreciable bowel dilatation or air-fluid level to suggest bowel obstruction. No free air. Lung bases clear. Postoperative changes noted in the upper abdomen. There is aortic and splenic artery atherosclerotic calcification. IMPRESSION: No bowel obstruction or free air. Lung bases clear. Aortic Atherosclerosis (ICD10-I70.0). Electronically Signed   By: Lowella Grip III M.D.   On: 02/05/2021 09:00   DG Abd 1 View  Result Date: 02/01/2021 CLINICAL DATA:  Abdominal pain and distension EXAM: ABDOMEN - 1 VIEW COMPARISON:  CT abdomen 05/14/2018 FINDINGS: Small bowel  pattern is unremarkable. Large amount of gas and stool throughout the colon. Peripheral gas pattern in the rectum presumed to be peripheral gas around impacted stool. The unlikely possibility pneumatosis is not excluded. Arterial vascular calcification is noted throughout. Clips in the right upper quadrant consistent with previous cholecystectomy. Scoliosis and degenerative changes of the spine. IMPRESSION: Large amount of gas and stool throughout the colon. Peripheral gas pattern in the rectum presumed to be peripheral gas around impacted stool. The unlikely possibility of pneumatosis is not excluded. If there is clinical concern regarding the possibility bowel infarction, consider CT of the abdomen pelvis. Otherwise, the findings could simply be due to constipation. Electronically Signed   By: Nelson Chimes M.D.   On: 02/01/2021 19:26   CT HEAD WO CONTRAST  Result Date: 02/01/2021 CLINICAL DATA:  Delirium. EXAM: CT HEAD WITHOUT CONTRAST TECHNIQUE: Contiguous axial images were obtained from the base of the skull through the vertex without intravenous contrast. COMPARISON:  Noncontrast head CT 01/27/2021. Brain MRI 01/27/2021. CT angiogram head/neck 01/28/2021. FINDINGS: Brain: Moderate cerebral and cerebellar atrophy. Known early subacute infarct within the left corona radiata/basal ganglia. Background moderate multifocal T2/FLAIR hyperintensity within the cerebral white matter is nonspecific compatible with chronic small vessel ischemic disease. Redemonstrated small early subacute infarct within the left cerebellar hemisphere. Superimposed small chronic infarcts within the bilateral cerebellar hemispheres. There is no acute intracranial hemorrhage. No extra-axial fluid collection. No evidence of intracranial mass. No midline shift. Vascular: No hyperdense vessel.  Atherosclerotic calcifications. Skull: Normal. Negative for fracture or focal lesion. Sinuses/Orbits: Visualized orbits show no acute finding. Trace  ethmoid sinus mucosal thickening. IMPRESSION: No evidence of interval acute intracranial abnormality. Known subacute infarcts within the left corona radiata/basal ganglia and left cerebellar hemisphere. Stable background moderate generalized atrophy of the brain and chronic small vessel ischemic disease. Unchanged small remote infarcts within the bilateral cerebellar hemispheres. Electronically Signed   By: Kellie Simmering DO   On: 02/01/2021 14:59   CT HEAD WO CONTRAST  Result Date: 01/27/2021 CLINICAL DATA:  Right-sided weakness with slurred speech EXAM: CT HEAD WITHOUT CONTRAST TECHNIQUE: Contiguous axial images were obtained from the base of the skull through the vertex without intravenous contrast. COMPARISON:  02/19/2017 FINDINGS: Brain: No evidence of acute gray matter infarction, hemorrhage, hydrocephalus, extra-axial collection or mass lesion/mass effect. Confluent chronic small vessel ischemia in the deep cerebral white matter. Increased number of chronic appearing  bilateral cerebellar infarcts. Age congruent brain volume Vascular: No hyperdense vessel or unexpected calcification. Skull: Normal. Negative for fracture or focal lesion. Sinuses/Orbits: Bilateral cataract resection. IMPRESSION: 1. No acute finding. 2. Extensive chronic small vessel disease. Electronically Signed   By: Monte Fantasia M.D.   On: 01/27/2021 07:23   CT ANGIO NECK W OR WO CONTRAST  Result Date: 01/28/2021 CLINICAL DATA:  Stroke. TIA. Left cerebellar and basal ganglia strokes. EXAM: CT ANGIOGRAPHY HEAD AND NECK TECHNIQUE: Multidetector CT imaging of the head and neck was performed using the standard protocol during bolus administration of intravenous contrast. Multiplanar CT image reconstructions and MIPs were obtained to evaluate the vascular anatomy. Carotid stenosis measurements (when applicable) are obtained utilizing NASCET criteria, using the distal internal carotid diameter as the denominator. CONTRAST:  21mL OMNIPAQUE  IOHEXOL 350 MG/ML SOLN COMPARISON:  CT and MRI studies done yesterday. FINDINGS: CT HEAD FINDINGS Brain: Focal low-density now evident in the left cerebellum. No evidence of hemorrhagic transformation. Cerebral hemispheres show a background pattern of extensive chronic small vessel disease of the white matter. The acute infarction in the left basal ganglia and radiating white matter tracts is barely discernible. No hemorrhagic transformation. No new insult. No hydrocephalus or extra-axial collection. Vascular: There is atherosclerotic calcification of the major vessels at the base of the brain. Skull: Negative Sinuses: Clear Orbits: Normal Review of the MIP images confirms the above findings CTA NECK FINDINGS Aortic arch: Aortic atherosclerotic calcification. Branching pattern is normal without flow limiting stenosis. Right carotid system: Common carotid artery shows atherosclerotic plaque but without flow limiting stenosis proximal to the bifurcation. Dense calcified plaque at the carotid bifurcation and ICA bulb. Minimal diameter in the ICA bulb is 1.5 mm. Compared to a more distal cervical ICA of 5 mm, this indicates a 70% stenosis. Left carotid system: Common carotid artery shows scattered plaque but is sufficiently patent to the bifurcation region. Dense calcified plaque at the carotid bifurcation and ICA bulb. Minimal diameter in the ICA bulb is 2.5 mm. Compared to a more distal cervical ICA diameter of 5 mm, this indicates a 50% stenosis. Vertebral arteries: Both subclavian arteries show atherosclerotic disease but without a flow limiting stenosis. Both vertebral arteries are patent with stenosis of 50% at their origins. Beyond that, the vessels are patent through the cervical region to the foramen magnum. Skeleton: Ordinary cervical spondylosis. Chronic arthropathy at the C1-2 articulation. Other neck: No mass or lymphadenopathy in the neck. Upper chest: Mild scarring at the lung apices.  No active process.  Review of the MIP images confirms the above findings CTA HEAD FINDINGS Anterior circulation: Both internal carotid arteries are patent through the skull base and siphon regions. There is calcification in the carotid siphon regions with stenosis estimated at 30% on both sides. The anterior and middle cerebral vessels are patent without proximal stenosis, aneurysm or vascular malformation. No large or medium vessel occlusion identified. Posterior circulation: Both vertebral arteries are patent through the foramen magnum to the basilar. Focal plaque in the left vertebral V4 segment with stenosis of 30%. No basilar stenosis. Major posterior circulation branch vessels are patent. Left PCA receives its supply from the anterior circulation. Venous sinuses: Patent and normal. Anatomic variants: None Review of the MIP images confirms the above findings IMPRESSION: 1. Dense calcified plaque at both carotid bifurcations and ICA bulb regions. 70% stenosis of the ICA bulb on the right. 50% stenosis of the ICA bulb on the left. 2. 50% stenoses at both vertebral artery origins.  30% stenosis of the left vertebral V4 segment. 3. No intracranial large or medium vessel occlusion or correctable proximal stenosis. Aortic Atherosclerosis (ICD10-I70.0). Electronically Signed   By: Nelson Chimes M.D.   On: 01/28/2021 19:56   MR BRAIN WO CONTRAST  Result Date: 02/03/2021 CLINICAL DATA:  Delirium.  Recent stroke. EXAM: MRI HEAD WITHOUT CONTRAST TECHNIQUE: Multiplanar, multiecho pulse sequences of the brain and surrounding structures were obtained without intravenous contrast. COMPARISON:  Head CT 02/01/2021 and MRI 01/27/2021 FINDINGS: Brain: Recent infarcts are again noted in the left corona radiata and left cerebellum. The left corona radiata infarct has slightly enlarged from the prior MRI while the cerebellar infarct is unchanged. No new infarct is identified elsewhere. Background patchy to confluent T2 hyperintensities in the  cerebral white matter bilaterally are unchanged and nonspecific but compatible with moderate chronic small-vessel ischemic disease. Chronic bilateral cerebellar infarcts are again noted. There is moderate cerebral atrophy. Vascular: Major intracranial vascular flow voids are preserved. Skull and upper cervical spine: Unremarkable bone marrow signal. Sinuses/Orbits: Bilateral cataract extraction. Paranasal sinuses and mastoid air cells are clear. Other: None. IMPRESSION: 1. Slight enlargement of the left corona radiata infarct since the 01/27/2021 MRI. 2. Unchanged subacute left cerebellar infarct. 3. Moderate chronic small-vessel ischemic disease. Electronically Signed   By: Logan Bores M.D.   On: 02/03/2021 13:04   MR BRAIN WO CONTRAST  Addendum Date: 01/27/2021   ADDENDUM REPORT: 01/27/2021 12:00 ADDENDUM: These results were called by telephone at the time of interpretation on 01/27/2021 at 12:00 pm to provider JOSHUA ZAVITZ , who verbally acknowledged these results. Electronically Signed   By: Pedro Earls M.D.   On: 01/27/2021 12:00   Result Date: 01/27/2021 CLINICAL DATA:  Stroke suspected. EXAM: MRI HEAD WITHOUT CONTRAST TECHNIQUE: Multiplanar, multiecho pulse sequences of the brain and surrounding structures were obtained without intravenous contrast. COMPARISON:  Head CT January 27, 2021. FINDINGS: Brain: Areas of restricted diffusion within the left cerebellar hemisphere and left caudate body extending into the lenticulocapsular region, consistent with acute infarct. No hemorrhage, hydrocephalus, extra-axial collection or mass lesion. Scattered and confluent foci of T2 hyperintensity are seen within the white matter of the cerebral hemispheres, nonspecific, most likely related to chronic microvascular ischemic changes. Remote lacunar infarcts in the bilateral cerebellar hemispheres. Vascular: Normal flow voids. Skull and upper cervical spine: Normal marrow signal. Sinuses/Orbits:  Bilateral lens surgery. Paranasal sinuses are clear. IMPRESSION: 1. Areas of restricted diffusion within the left cerebellar hemisphere and left caudate body extending into the lenticulocapsular region, consistent with acute infarcts. 2. Remote lacunar infarcts in the bilateral cerebellar hemispheres. 3. Moderate chronic microvascular ischemic changes. Electronically Signed: By: Pedro Earls M.D. On: 01/27/2021 11:54   ECHOCARDIOGRAM COMPLETE  Result Date: 01/29/2021    ECHOCARDIOGRAM REPORT   Patient Name:   Carol [name] Date of Exam: 01/29/2021 Medical Rec #:  [id number]           Height:       62.0 in Accession #:    [id number]          Weight:       141.8 lb Date of Birth:  1928-10-16            BSA:          1.652 m Patient Age:    57 years            BP:           161/92 mmHg Patient Gender:  F                   HR:           116 bpm. Exam Location:  Inpatient Procedure: 2D Echo Indications:    Stroke 434.91 / I163.9  History:        Patient has prior history of Echocardiogram examinations, most                 recent 01/17/2012. CAD, Prior CABG, Stroke, Arrythmias:Atrial                 Fibrillation; Risk Factors:Dyslipidemia, Former Smoker and                 Hypertension. Sepsis, 19 mm Magna Ease pericardial tissue valve                 12/28/2011.                 Aortic Valve: 19 mm Edwards MagnaEase valve is present in the                 aortic position.  Sonographer:    Leavy Cella Referring Phys: [place] Comments: Restricted Mobility. IMPRESSIONS  1. Left ventricular ejection fraction, by estimation, is 60 to 65%. The left ventricle has normal function. The left ventricle has no regional wall motion abnormalities. Left ventricular diastolic function could not be evaluated.  2. Right ventricular systolic function is normal. The right ventricular size is normal. There is normal pulmonary artery systolic pressure.  3. The mitral valve is abnormal. Trivial  mitral valve regurgitation. Moderate mitral annular calcification.  4. The aortic valve has been repaired/replaced. Aortic valve regurgitation is trivial. There is a 19 mm Edwards MagnaEase valve present in the aortic position. Echo findings are consistent with normal structure and function of the aortic valve prosthesis. Aortic valve mean gradient measures 14.3 mmHg. Aortic valve Vmax measures 2.44 m/s.  5. The inferior vena cava is normal in size with greater than 50% respiratory variability, suggesting right atrial pressure of 3 mmHg. Comparison(s): Prior images unable to be directly viewed, comparison made by report only. Changes from prior study are noted. 01/17/12: LVEF 55-60%, aortic bioprosthesis -mean gradient 22 mmHg. FINDINGS  Left Ventricle: Left ventricular ejection fraction, by estimation, is 60 to 65%. The left ventricle has normal function. The left ventricle has no regional wall motion abnormalities. The left ventricular internal cavity size was normal in size. There is  no left ventricular hypertrophy. Left ventricular diastolic function could not be evaluated due to atrial fibrillation. Left ventricular diastolic function could not be evaluated. Right Ventricle: The right ventricular size is normal. No increase in right ventricular wall thickness. Right ventricular systolic function is normal. There is normal pulmonary artery systolic pressure. The tricuspid regurgitant velocity is 2.43 m/s, and  with an assumed right atrial pressure of 3 mmHg, the estimated right ventricular systolic pressure is 84.1 mmHg. Left Atrium: Left atrial size was normal in size. Right Atrium: Right atrial size was normal in size. Pericardium: There is no evidence of pericardial effusion. Mitral Valve: The mitral valve is abnormal. There is mild thickening of the mitral valve leaflet(s). Moderate mitral annular calcification. Trivial mitral valve regurgitation. Tricuspid Valve: The tricuspid valve is grossly normal.  Tricuspid valve regurgitation is trivial. Aortic Valve: The aortic valve has been repaired/replaced. Aortic valve regurgitation is trivial. Aortic valve mean gradient measures 14.3 mmHg. Aortic valve peak gradient  measures 23.9 mmHg. Aortic valve area, by VTI measures 0.53 cm. There is a 19 mm Edwards MagnaEase valve present in the aortic position. Echo findings are consistent with normal structure and function of the aortic valve prosthesis. Pulmonic Valve: The pulmonic valve was normal in structure. Pulmonic valve regurgitation is trivial. Aorta: The aortic root and ascending aorta are structurally normal, with no evidence of dilitation. Venous: The inferior vena cava is normal in size with greater than 50% respiratory variability, suggesting right atrial pressure of 3 mmHg. IAS/Shunts: No atrial level shunt detected by color flow Doppler.  LEFT VENTRICLE PLAX 2D LVOT diam:     1.90 cm  Diastology LV SV:         23       LV e' medial:   3.70 cm/s LV SV Index:   14       LV E/e' medial: 34.3 LVOT Area:     2.84 cm  RIGHT VENTRICLE RV S prime:     14.20 cm/s TAPSE (M-mode): 1.5 cm LEFT ATRIUM             Index       RIGHT ATRIUM           Index LA diam:        3.60 cm 2.18 cm/m  RA Area:     16.20 cm LA Vol (A2C):   49.1 ml 29.73 ml/m RA Volume:   46.60 ml  28.22 ml/m LA Vol (A4C):   47.9 ml 29.00 ml/m LA Biplane Vol: 53.2 ml 32.21 ml/m  AORTIC VALVE AV Area (Vmax):    0.59 cm AV Area (Vmean):   0.57 cm AV Area (VTI):     0.53 cm AV Vmax:           244.33 cm/s AV Vmean:          174.000 cm/s AV VTI:            0.432 m AV Peak Grad:      23.9 mmHg AV Mean Grad:      14.3 mmHg LVOT Vmax:         51.20 cm/s LVOT Vmean:        35.267 cm/s LVOT VTI:          0.080 m LVOT/AV VTI ratio: 0.19  AORTA Ao Root diam: 2.40 cm MITRAL VALVE                TRICUSPID VALVE MV Area (PHT): 6.71 cm     TR Peak grad:   23.6 mmHg MV Decel Time: 113 msec     TR Vmax:        243.00 cm/s MV E velocity: 127.00 cm/s MV A velocity:  58.10 cm/s   SHUNTS MV E/A ratio:  2.19         Systemic VTI:  0.08 m                             Systemic Diam: 1.90 cm Lyman Bishop MD Electronically signed by Lyman Bishop MD Signature Date/Time: 01/29/2021/12:51:58 PM    Final    VAS US CAROTID (at [facility] and [facility] only)  Result Date: 01/30/2021 Carotid Arterial Duplex Study Indications:       CVA. Risk Factors:      Hypertension, hyperlipidemia, coronary artery disease. Comparison Study:  01-22-2017 Prior carotid artery duplex study showed 1-39%  stenosis bilaterally. Performing Technologist: Darlin Coco RDMS  Examination Guidelines: A complete evaluation includes B-mode imaging, spectral Doppler, color Doppler, and power Doppler as needed of all accessible portions of each vessel. Bilateral testing is considered an integral part of a complete examination. Limited examinations for reoccurring indications may be performed as noted.  Right Carotid Findings: +----------+--------+--------+--------+------------------+---------------------+           PSV cm/sEDV cm/sStenosisPlaque DescriptionComments              +----------+--------+--------+--------+------------------+---------------------+ CCA Prox  89      16                                                      +----------+--------+--------+--------+------------------+---------------------+ CCA Distal69      16              calcific                                +----------+--------+--------+--------+------------------+---------------------+ ICA Prox  131     31      1-39%   calcific          Velocities may                                                            underestimate degree                                                      of stenosis due to                                                        more proximal                                                             obstruction.           +----------+--------+--------+--------+------------------+---------------------+ ICA Mid   133     25                                                      +----------+--------+--------+--------+------------------+---------------------+ ICA Distal104     23                                                      +----------+--------+--------+--------+------------------+---------------------+  ECA       246             >50%    calcific                                +----------+--------+--------+--------+------------------+---------------------+ +----------+--------+-------+----------------+-------------------+           PSV cm/sEDV cmsDescribe        Arm Pressure (mmHG) +----------+--------+-------+----------------+-------------------+ [id number]            Multiphasic, WNL                    +----------+--------+-------+----------------+-------------------+ +---------+--------+--+--------+--+---------+ VertebralPSV cm/s44EDV cm/s12Antegrade +---------+--------+--+--------+--+---------+  Left Carotid Findings: +----------+--------+--------+--------+------------------+--------+           PSV cm/sEDV cm/sStenosisPlaque DescriptionComments +----------+--------+--------+--------+------------------+--------+ CCA Prox  101     18                                         +----------+--------+--------+--------+------------------+--------+ CCA Distal74      13              calcific                   +----------+--------+--------+--------+------------------+--------+ ICA Prox  140     18      1-39%   calcific                   +----------+--------+--------+--------+------------------+--------+ ICA Mid   126     23                                         +----------+--------+--------+--------+------------------+--------+ ICA Distal104     20                                         +----------+--------+--------+--------+------------------+--------+  ECA       260             >50%    calcific                   +----------+--------+--------+--------+------------------+--------+ +----------+--------+--------+----------------+-------------------+           PSV cm/sEDV cm/sDescribe        Arm Pressure (mmHG) +----------+--------+--------+----------------+-------------------+ [id number]             Multiphasic, WNL                    +----------+--------+--------+----------------+-------------------+ +---------+--------+--+--------+--+---------+ VertebralPSV cm/s97EDV cm/s15Antegrade +---------+--------+--+--------+--+---------+   Summary: Right Carotid: Velocities in the right ICA are consistent with a 1-39% stenosis.                The ECA appears >50% stenosed. Left Carotid: Velocities in the left ICA are consistent with a 1-39% stenosis.               The ECA appears >50% stenosed. Vertebrals:  Bilateral vertebral arteries demonstrate antegrade flow. Subclavians: Normal flow hemodynamics were seen in bilateral subclavian              arteries. *See table(s) above for measurements and observations.  Electronically signed by Antony Contras MD on 01/30/2021 at 8:49:27 AM.    Final  Assessment/Plan Acute CVA (cerebrovascular accident) [facility]) Not doing well Continues to be Tired sleepy. Not much progress with therapy Discussed with Social worker Eliquis dose was increased in the hospital On Statin Echo in the hospital was negative for any Emboli Plan for follow up with Neurology Hyponatremia ON Water restriction Sodium is in good levels NA 134 Autoimmune hemolytic anemia (HCC) HGB is low again. She is on Prednisone Will repeat in 1 week Has follow up with Hematology  Paroxysmal atrial fibrillation (HCC) On Eliquis and Metoprolol Dose for Metoprolol decreased due to low BP Hyperlipidemia, unspecified hyperlipidemia type LDL 48 Multiple Skin tears' D/w Wound care Nurse ACP D/W the Social worker They had Care plan  Meeting with the Family and Therapy has told them that her progression stays very slow She would not be able to go back to her Apartment     Family/ staff Communication:   Labs/tests ordered:  CBC  Total time spent in this patient care encounter was  _45  minutes; greater than 50% of the visit spent counseling  staff, reviewing records , Labs and coordinating care for problems addressed at this encounter.

## 2021-02-21 ENCOUNTER — Encounter: Payer: Self-pay | Admitting: Nurse Practitioner

## 2021-02-21 ENCOUNTER — Non-Acute Institutional Stay (SKILLED_NURSING_FACILITY): Payer: Medicare Other | Admitting: Nurse Practitioner

## 2021-02-21 DIAGNOSIS — E871 Hypo-osmolality and hyponatremia: Secondary | ICD-10-CM

## 2021-02-21 DIAGNOSIS — I2510 Atherosclerotic heart disease of native coronary artery without angina pectoris: Secondary | ICD-10-CM

## 2021-02-21 DIAGNOSIS — 419620001 Death: Secondary | SNOMED CT | POA: Diagnosis not present

## 2021-02-21 DIAGNOSIS — D591 Autoimmune hemolytic anemia, unspecified: Secondary | ICD-10-CM

## 2021-02-21 DIAGNOSIS — I5032 Chronic diastolic (congestive) heart failure: Secondary | ICD-10-CM

## 2021-02-21 DIAGNOSIS — N3281 Overactive bladder: Secondary | ICD-10-CM | POA: Diagnosis not present

## 2021-02-21 DIAGNOSIS — I951 Orthostatic hypotension: Secondary | ICD-10-CM

## 2021-02-21 DIAGNOSIS — M5430 Sciatica, unspecified side: Secondary | ICD-10-CM

## 2021-02-21 DIAGNOSIS — I4819 Other persistent atrial fibrillation: Secondary | ICD-10-CM | POA: Diagnosis not present

## 2021-02-21 DIAGNOSIS — I639 Cerebral infarction, unspecified: Secondary | ICD-10-CM | POA: Diagnosis not present

## 2021-02-21 DIAGNOSIS — E039 Hypothyroidism, unspecified: Secondary | ICD-10-CM | POA: Diagnosis not present

## 2021-02-21 DIAGNOSIS — R413 Other amnesia: Secondary | ICD-10-CM

## 2021-02-21 DIAGNOSIS — K5900 Constipation, unspecified: Secondary | ICD-10-CM | POA: Diagnosis not present

## 2021-02-21 DIAGNOSIS — R5383 Other fatigue: Secondary | ICD-10-CM | POA: Diagnosis not present

## 2021-02-21 NOTE — Assessment & Plan Note (Signed)
Orthostatic hypotension,

## 2021-02-21 NOTE — Progress Notes (Signed)
Location:    [facility] Room Number: 32 Place of Service:  SNF (31) Provider: Lennie Odor Mast NP  Lujean Amel, MD  Patient Care Team: Lujean Amel, MD as PCP - General (Family Medicine) Revankar, Reita Cliche, MD as PCP - Cardiology (Cardiology) Tanda Rockers, MD (Pulmonary Disease) Jacolyn Reedy, MD as Consulting Physician (Cardiology) Clent Jacks, MD as Consulting Physician (Ophthalmology) Hayden Pedro, MD as Consulting Physician (Ophthalmology) Gardiner Barefoot, DPM as Consulting Physician (Podiatry) Hayden Pedro, MD as Consulting Physician (Ophthalmology) Kathrynn Ducking, MD as Consulting Physician (Neurology) Revankar, Reita Cliche, MD as Consulting Physician (Cardiology)  Extended Emergency Contact Information Primary Emergency Contact: Spaeth,[name] Address: [address]          [place], [place] Work Phone: [phone] Mobile Phone: [phone] Relation: Daughter Preferred language: English Interpreter needed? No  Code Status:  DNR Goals of care: Advanced Directive information Advanced Directives 01/27/2021  Does Patient Have a Medical Advance Directive? Yes  Type of Paramedic of [place];Living will  Does patient want to make changes to medical advance directive? No - Guardian declined  Copy of [place] in Chart? -  Would patient like information on creating a medical advance directive? -  Pre-existing out of facility DNR order (yellow form or pink MOST form) -     Chief Complaint  Patient presents with  . Medical Management of Chronic Issues    HPI:  Pt is a 87 y.o. female seen today for medical management of chronic diseases.      Hyponatremia, improved, on 1535ml/day fluid restriction, Na 134 02/16/21                          Acute CVA 01/2021 with R leg weakness ,trouble with word finding, per MRI left cerebral, embolic hemisphere, Eliquis was increased  to 5mg  bid, takes Atorvastatin.  Lethargy, seems better today,  CXR 02/10/21 no acute cardiopulmonary disease.  Orthostatic hypotension, Anemia chronic/Autoimmune hemolytic anemia, Hgb 8.2 02/16/21, on Prednisone Hypothyroidism, takes Levothyroxicortisol unremarkable ne, TSH 3.494 01/28/21 AFib, EF 50-65%, LDL 48. F/u cardiology, takes Metoprolol, Eliquis.  CHF euvolemic orthostaticBun/creat 22/0.7 02/16/21,  EF 50-65%, CAD Status post CABG in 2013, has history of severe aortic stenosis status post tissue AVR in 2013. Follows with cardiology. Constipation, takes Senokot, MiraLax, Colace Urine frequency, takes Oxybutynin Peripheral neuropathy, takes Gabapentin Confusion, in hospital, CT head 02/01/21 no acute intracranial abnormality. UTI, acute CVA, underlying dementia may be contributory. F/u neurology.     Past Medical History:  Diagnosis Date  . Acute blood loss anemia 05/16/2018  . AIHA (autoimmune hemolytic anemia) (HCC)   . Allergic rhinitis due to pollen 01/18/2021  . Anxiety disorder 01/18/2021  . Aortic stenosis   . Atherosclerotic heart disease of native coronary artery without angina pectoris 01/18/2021  . Atrial fibrillation ([place])    Noted during recent cardiac surgery and treated with amiodarone   . Autoimmune hemolytic anemia (HCC)   . AV heart block 11/23/2020  . CAD (coronary artery disease) 12/26/2011   Severe ostial LM disease noted at cath 12/26/11   . Chronic diastolic heart failure ([place]) 01/17/2012  . Complication of surgical procedure 01/18/2021  . Constipation 01/18/2021  . Coronary artery disease    Cath 1/13 left main  . 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 Dyspnea   . Elevated bilirubin 05/14/2018  . Essential hypertension 01/18/2021  .  Hypercoagulable state ([place]) 01/18/2021  . Hyperlipidemia   . Hypertension   .  Hypertensive heart disease without CHF   . Hyponatremia 01/17/2012  . Hypothyroidism   . Long term (current) use of anticoagulants 07/14/2013  . Lumbar degenerative disc disease    Prior lumbar laminectomy 12/2009 Dr. Rolena Infante.  Failed epidural steroids.  Currently on gabapentin   . Lumbar disc disease   . Lung nodules    Sees [facility], thought to be benign  . Memory problem 01/18/2021  . Mixed hyperlipidemia 01/18/2021  . Obesity   . Obstructive jaundice 05/14/2018  . Overactive bladder 01/18/2021  . PAF (paroxysmal atrial fibrillation) ([place]) 01/30/2019  . Paroxysmal atrial fibrillation ([place]) 01/30/2019  . Personal history of solitary pulmonary nodule 11/07/2011   Followed in Pulmonary clinic/ [facility]/ [facility]   - PET 11/19/11 indeterminant LUL nodule but slt reduced in size vs prev studies so rec f/u cxr in 2 months (tickle file) Wedge resection by Dr. Servando Snare at time of AVR/CABG 12/28/2011 >   wedge biopsy/resection, Left upper lobe - NON-NECROTIZING GRANULOMATOUS INFLAMMATION WITH ASSOCIATED MULTINUCLEATED GIANT CELLS.   . Polyp of colon 01/18/2021  . S/P AVR and CABG 12/28/2011   19 mm Magna Ease pericardial tissue valve CABG x 3 (LIMA-LAD, SVG-Int, SVG-dRCA)12/28/2011 Dr. Servando Snare    . Sciatica 01/18/2021  . Senile purpura ([place]) 01/18/2021  . Sepsis ([place]) 08/17/2018  . Spinal stenosis   . Villous adenoma of rectum    Past Surgical History:  Procedure Laterality Date  . AORTIC VALVE REPLACEMENT  12/28/2011   Procedure: AORTIC VALVE REPLACEMENT (AVR);  Surgeon: Grace Isaac, MD;  Location: [facility];  Service: Open Heart Surgery;  Laterality: N/A;  . CARDIAC CATHETERIZATION  2013  . CHOLECYSTECTOMY    . CORONARY ARTERY BYPASS GRAFT  12/28/2011   Procedure: CORONARY ARTERY BYPASS GRAFTING (CABG);  Surgeon: Grace Isaac, MD;  Location: [facility];  Service: Open Heart Surgery;  Laterality: N/A;  . HERNIA REPAIR    . LEFT AND RIGHT HEART CATHETERIZATION WITH CORONARY ANGIOGRAM N/A 12/26/2011    Procedure: LEFT AND RIGHT HEART CATHETERIZATION WITH CORONARY ANGIOGRAM;  Surgeon: Jacolyn Reedy, MD;  Location[facility] C[facility];  Service: Cardiovascular;  Laterality: N/A;  . LUMBAR LAMINECTOMY    . PARTIAL COLECTOMY  2008   Villous adenoma    Allergies  Allergen Reactions  . Ibuprofen Other (See Comments)    Hx of GI bleed  . Tapentadol Other (See Comments)    Nucynta- Knows she "cannot take"- made the patient "very sick"   . Ace Inhibitors Cough  . Angiotensin Receptor Blockers Cough  . Dicloxacillin Nausea Only and Other (See Comments)    Made the patient lightheaded, also  . Doxycycline Nausea Only  . Hydrocodone Nausea Only and Other (See Comments)    Made the patient lightheaded, also  . Nsaids     Can tolerate only Tylenol (has a history of GI BLEEDS)  . Adhesive [Tape] Rash    Blisters (can use only paper tape)  . Latex Rash and Other (See Comments)    Paper tape only    Allergies as of 02/21/2021      Reactions   Ibuprofen Other (See Comments)   Hx of GI bleed   Tapentadol Other (See Comments)   Nucynta- Knows she "cannot take"- made the patient "very sick"   Ace Inhibitors Cough   Angiotensin Receptor Blockers Cough   Dicloxacillin Nausea Only, Other (See Comments)   Made the  patient lightheaded, also   Doxycycline Nausea Only   Hydrocodone Nausea Only, Other (See Comments)   Made the patient lightheaded, also   Nsaids    Can tolerate only Tylenol (has a history of GI BLEEDS)   Adhesive [tape] Rash   Blisters (can use only paper tape)   Latex Rash, Other (See Comments)   Paper tape only      Medication List       Accurate as of February 21, 2021  3:57 PM. If you have any questions, ask your nurse or doctor.        acetaminophen 325 MG tablet Commonly known as: TYLENOL Take 650 mg by mouth every morning.   apixaban 5 MG Tabs tablet Commonly known as: Eliquis Take 1 tablet (5 mg total) by mouth 2 (two) times daily.   atorvastatin 20 MG  tablet Commonly known as: LIPITOR TAKE 1 TABLET BY MOUTH EVERYDAY AT BEDTIME   Besivance 0.6 % Susp Generic drug: Besifloxacin HCl Place 1 drop into the left eye See admin instructions. Place 1 drop left eye 4 times daily for 2 days after Avastin eye injections   CALCIUM 600 + D PO Take 1 tablet by mouth every morning.   cetirizine 10 MG tablet Commonly known as: ZYRTEC Take 10 mg by mouth daily in the afternoon.   docusate sodium 100 MG capsule Commonly known as: COLACE Take 100 mg by mouth daily in the afternoon.   feeding supplement Liqd Take 1 Container by mouth daily.   gabapentin 100 MG capsule Commonly known as: NEURONTIN Take 100 mg by mouth 3 (three) times daily.   levothyroxine 88 MCG tablet Commonly known as: SYNTHROID Take 88 mcg by mouth daily before breakfast.   Metoprolol Tartrate 75 MG Tabs Take 1 tablet by mouth daily. What changed: Another medication with the same name was removed. Continue taking this medication, and follow the directions you see here. Changed by: Man X Mast, NP   metoprolol tartrate 50 MG tablet Commonly known as: LOPRESSOR Take 1 tablet (50 mg total) by mouth at bedtime. What changed: Another medication with the same name was removed. Continue taking this medication, and follow the directions you see here. Changed by: Man X Mast, NP   Multi For Her 50+ Tabs Take 1 tablet by mouth daily in the afternoon.   nystatin powder Commonly known as: MYCOSTATIN/NYSTOP Apply topically 2 (two) times daily.   oxybutynin 5 MG tablet Commonly known as: DITROPAN Take 5 mg by mouth every other day.   polyethylene glycol 17 g packet Commonly known as: MIRALAX / GLYCOLAX Take 17 g by mouth 2 (two) times daily.   predniSONE 5 MG tablet Commonly known as: DELTASONE Take 5 mg by mouth daily with breakfast.   Santyl ointment Generic drug: collagenase Apply 1 application topically See admin instructions. Apply daily as directed to the  affected toe   senna 8.6 MG Tabs tablet Commonly known as: SENOKOT Take 2 tablets (17.2 mg total) by mouth daily.   zinc oxide 20 % ointment Apply 1 application topically as needed for irritation.       Review of Systems  Constitutional: Positive for fatigue. Negative for fever and unexpected weight change.  HENT: Positive for hearing loss. Negative for congestion, trouble swallowing and voice change.   Respiratory: Negative for cough, shortness of breath and wheezing.   Cardiovascular: Positive for leg swelling. Negative for chest pain and palpitations.  Gastrointestinal: Negative for abdominal pain and constipation.  Genitourinary: Positive  for frequency. Negative for dysuria and urgency.  Musculoskeletal: Positive for arthralgias, back pain and gait problem.  Skin:       Ecchymoses all R+L arms.   Neurological: Positive for weakness. Negative for seizures, speech difficulty, light-headedness and headaches.       Memory lapses. R leg weakness.   Psychiatric/Behavioral: Positive for confusion. Negative for agitation, behavioral problems and sleep disturbance. The patient is not nervous/anxious.     Immunization History  Administered Date(s) Administered  . Influenza Split 10/09/2007, 09/06/2009, 09/22/2010, 09/21/2011, 09/15/2012, 09/24/2013, 09/27/2014, 09/28/2015, 09/17/2016, 10/07/2019  . Influenza,inj,Quad PF,6+ Mos 09/18/2017, 09/25/2018, 09/30/2020  . Influenza-Unspecified 08/26/2018, 10/20/2019  . Moderna Sars-Covid-2 Vaccination 12/28/2019, 01/25/2020  . Pneumococcal Conjugate-13 04/21/2014  . Pneumococcal Polysaccharide-23 12/30/2003  . Td 12/30/2003, 10/21/2015  . Zoster 09/12/2006, 08/04/2018, 10/06/2018   Pertinent  Health Maintenance Due  Topic Date Due  . DEXA SCAN  Never done  . INFLUENZA VACCINE  Completed  . PNA vac Low Risk Adult  Completed   No flowsheet data found. Functional Status Survey:    Vitals:   02/21/21 1510  BP: 132/69  Pulse: 80   Resp: 20  Temp: (!) 97.3 F (36.3 C)  SpO2: 95%  Weight: 139 lb 3.2 oz (63.1 kg)  Height: 5\' 4"  (1.626 m)   Body mass index is 23.89 kg/m. Physical Exam Constitutional:      Appearance: Normal appearance.  HENT:     Head: Normocephalic and atraumatic.     Nose: Nose normal.     Mouth/Throat:     Mouth: Mucous membranes are moist.  Eyes:     Extraocular Movements: Extraocular movements intact.     Conjunctiva/sclera: Conjunctivae normal.     Pupils: Pupils are equal, round, and reactive to light.  Cardiovascular:     Rate and Rhythm: Normal rate and regular rhythm.     Heart sounds: No murmur heard.   Pulmonary:     Effort: Pulmonary effort is normal.     Breath sounds: No rales.  Abdominal:     General: Bowel sounds are normal.     Palpations: Abdomen is soft.     Tenderness: There is no abdominal tenderness.  Musculoskeletal:     Cervical back: Normal range of motion and neck supple.     Right lower leg: Edema present.     Left lower leg: Edema present.     Comments: Trace edema BLE  Skin:    General: Skin is warm and dry.     Findings: Bruising present.     Comments: Multiple ecchymoses arms, legs. Dark purple discoloration BLE from knee down.   Neurological:     General: No focal deficit present.     Mental Status: She is alert and oriented to person, place, and time. Mental status is at baseline.     Motor: Weakness present.     Coordination: Coordination abnormal.     Gait: Gait abnormal.     Comments: Right leg weakness.   Psychiatric:        Mood and Affect: Mood normal.        Behavior: Behavior normal.        Thought Content: Thought content normal.     Labs reviewed: Recent Labs    01/29/21 0202 01/30/21 0253 01/31/21 1214 02/01/21 0337 02/04/21 0518 02/05/21 0631 02/02/2021 0258 02/09/21 0000 02/13/21 0000 02/16/21 0000  NA 131*   < >  --    < > 134* 136 136 128* 132* 134*  K 3.8   < >  --    < > 3.9 4.0 3.9 4.5 4.1 4.0  CL 98   < >   --    < > 102 102 101 91* 95* 97*  CO2 25   < >  --    < > 23 26 26  31* 31* 29*  GLUCOSE 108*   < >  --    < > 118* 142* 120*  --   --   --   BUN 20   < >  --    < > 33* 27* 19 24* 19 22*  CREATININE 1.00   < >  --    < > 0.91 0.84 0.75 0.9 0.8 0.7  CALCIUM 8.8*   < >  --    < > 8.7* 8.5* 8.6* 9.2 9.0 8.8  MG 2.1  --  2.0  --   --   --   --   --   --   --    < > = values in this interval not displayed.   Recent Labs    11/28/20 1144 11/28/20 1144 01/27/21 0643 02/09/21 0000 02/13/21 0000 02/16/21 0000  AST 19   < > 22 21 17 15   ALT 14   < > 17 17 11 11   ALKPHOS 50  --  54 72 58 55  BILITOT 0.8  --  1.0  --   --   --   PROT 6.3*  --  6.5  --   --   --   ALBUMIN 3.8  --  3.8 3.3* 3.0* 3.0*   < > = values in this interval not displayed.   Recent Labs    02/02/21 0809 02/04/21 0518 02/10/2021 0258 02/09/21 0000 02/13/21 0000 02/16/21 0000  WBC 14.0* 10.5 10.3 13.5 11.7 10.1  NEUTROABS 11.0* 7.6 7.3 11,138.00 8,494.00 7,373.00  HGB 10.7* 10.2* 10.3* 10.2* 9.4* 8.2*  HCT 33.4* 32.3* 31.4* 31* 29* 24*  MCV 88.4 88.0 87.0  --   --   --   PLT 234 241 269 295 281 252   Lab Results  Component Value Date   TSH 3.494 01/28/2021   Lab Results  Component Value Date   HGBA1C 4.5 (L) 01/28/2021   Lab Results  Component Value Date   CHOL 123 01/28/2021   HDL 55 01/28/2021   LDLCALC 48 01/28/2021   TRIG 102 01/28/2021   CHOLHDL 2.2 01/28/2021    Significant Diagnostic Results in last 30 days:  CT ANGIO HEAD W OR WO CONTRAST  Result Date: 01/28/2021 CLINICAL DATA:  Stroke. TIA. Left cerebellar and basal ganglia strokes. EXAM: CT ANGIOGRAPHY HEAD AND NECK TECHNIQUE: Multidetector CT imaging of the head and neck was performed using the standard protocol during bolus administration of intravenous contrast. Multiplanar CT image reconstructions and MIPs were obtained to evaluate the vascular anatomy. Carotid stenosis measurements (when applicable) are obtained utilizing NASCET  criteria, using the distal internal carotid diameter as the denominator. CONTRAST:  108mL OMNIPAQUE IOHEXOL 350 MG/ML SOLN COMPARISON:  CT and MRI studies done yesterday. FINDINGS: CT HEAD FINDINGS Brain: Focal low-density now evident in the left cerebellum. No evidence of hemorrhagic transformation. Cerebral hemispheres show a background pattern of extensive chronic small vessel disease of the white matter. The acute infarction in the left basal ganglia and radiating white matter tracts is barely discernible. No hemorrhagic transformation. No new insult. No hydrocephalus or extra-axial collection. Vascular: There is atherosclerotic calcification of the major  vessels at the base of the brain. Skull: Negative Sinuses: Clear Orbits: Normal Review of the MIP images confirms the above findings CTA NECK FINDINGS Aortic arch: Aortic atherosclerotic calcification. Branching pattern is normal without flow limiting stenosis. Right carotid system: Common carotid artery shows atherosclerotic plaque but without flow limiting stenosis proximal to the bifurcation. Dense calcified plaque at the carotid bifurcation and ICA bulb. Minimal diameter in the ICA bulb is 1.5 mm. Compared to a more distal cervical ICA of 5 mm, this indicates a 70% stenosis. Left carotid system: Common carotid artery shows scattered plaque but is sufficiently patent to the bifurcation region. Dense calcified plaque at the carotid bifurcation and ICA bulb. Minimal diameter in the ICA bulb is 2.5 mm. Compared to a more distal cervical ICA diameter of 5 mm, this indicates a 50% stenosis. Vertebral arteries: Both subclavian arteries show atherosclerotic disease but without a flow limiting stenosis. Both vertebral arteries are patent with stenosis of 50% at their origins. Beyond that, the vessels are patent through the cervical region to the foramen magnum. Skeleton: Ordinary cervical spondylosis. Chronic arthropathy at the C1-2 articulation. Other neck: No mass  or lymphadenopathy in the neck. Upper chest: Mild scarring at the lung apices.  No active process. Review of the MIP images confirms the above findings CTA HEAD FINDINGS Anterior circulation: Both internal carotid arteries are patent through the skull base and siphon regions. There is calcification in the carotid siphon regions with stenosis estimated at 30% on both sides. The anterior and middle cerebral vessels are patent without proximal stenosis, aneurysm or vascular malformation. No large or medium vessel occlusion identified. Posterior circulation: Both vertebral arteries are patent through the foramen magnum to the basilar. Focal plaque in the left vertebral V4 segment with stenosis of 30%. No basilar stenosis. Major posterior circulation branch vessels are patent. Left PCA receives its supply from the anterior circulation. Venous sinuses: Patent and normal. Anatomic variants: None Review of the MIP images confirms the above findings IMPRESSION: 1. Dense calcified plaque at both carotid bifurcations and ICA bulb regions. 70% stenosis of the ICA bulb on the right. 50% stenosis of the ICA bulb on the left. 2. 50% stenoses at both vertebral artery origins. 30% stenosis of the left vertebral V4 segment. 3. No intracranial large or medium vessel occlusion or correctable proximal stenosis. Aortic Atherosclerosis (ICD10-I70.0). Electronically Signed   By: Nelson Chimes M.D.   On: 01/28/2021 19:56   DG Abd 1 View  Result Date: 02/05/2021 CLINICAL DATA:  Abdominal distension EXAM: ABDOMEN - 1 VIEW COMPARISON:  February 01, 2021 FINDINGS: No appreciable bowel dilatation or air-fluid level to suggest bowel obstruction. No free air. Lung bases clear. Postoperative changes noted in the upper abdomen. There is aortic and splenic artery atherosclerotic calcification. IMPRESSION: No bowel obstruction or free air. Lung bases clear. Aortic Atherosclerosis (ICD10-I70.0). Electronically Signed   By: Lowella Grip III M.D.    On: 02/05/2021 09:00   DG Abd 1 View  Result Date: 02/01/2021 CLINICAL DATA:  Abdominal pain and distension EXAM: ABDOMEN - 1 VIEW COMPARISON:  CT abdomen 05/14/2018 FINDINGS: Small bowel pattern is unremarkable. Large amount of gas and stool throughout the colon. Peripheral gas pattern in the rectum presumed to be peripheral gas around impacted stool. The unlikely possibility pneumatosis is not excluded. Arterial vascular calcification is noted throughout. Clips in the right upper quadrant consistent with previous cholecystectomy. Scoliosis and degenerative changes of the spine. IMPRESSION: Large amount of gas and stool throughout the colon.  Peripheral gas pattern in the rectum presumed to be peripheral gas around impacted stool. The unlikely possibility of pneumatosis is not excluded. If there is clinical concern regarding the possibility bowel infarction, consider CT of the abdomen pelvis. Otherwise, the findings could simply be due to constipation. Electronically Signed   By: Nelson Chimes M.D.   On: 02/01/2021 19:26   CT HEAD WO CONTRAST  Result Date: 02/01/2021 CLINICAL DATA:  Delirium. EXAM: CT HEAD WITHOUT CONTRAST TECHNIQUE: Contiguous axial images were obtained from the base of the skull through the vertex without intravenous contrast. COMPARISON:  Noncontrast head CT 01/27/2021. Brain MRI 01/27/2021. CT angiogram head/neck 01/28/2021. FINDINGS: Brain: Moderate cerebral and cerebellar atrophy. Known early subacute infarct within the left corona radiata/basal ganglia. Background moderate multifocal T2/FLAIR hyperintensity within the cerebral white matter is nonspecific compatible with chronic small vessel ischemic disease. Redemonstrated small early subacute infarct within the left cerebellar hemisphere. Superimposed small chronic infarcts within the bilateral cerebellar hemispheres. There is no acute intracranial hemorrhage. No extra-axial fluid collection. No evidence of intracranial mass. No  midline shift. Vascular: No hyperdense vessel.  Atherosclerotic calcifications. Skull: Normal. Negative for fracture or focal lesion. Sinuses/Orbits: Visualized orbits show no acute finding. Trace ethmoid sinus mucosal thickening. IMPRESSION: No evidence of interval acute intracranial abnormality. Known subacute infarcts within the left corona radiata/basal ganglia and left cerebellar hemisphere. Stable background moderate generalized atrophy of the brain and chronic small vessel ischemic disease. Unchanged small remote infarcts within the bilateral cerebellar hemispheres. Electronically Signed   By: Kellie Simmering DO   On: 02/01/2021 14:59   CT HEAD WO CONTRAST  Result Date: 01/27/2021 CLINICAL DATA:  Right-sided weakness with slurred speech EXAM: CT HEAD WITHOUT CONTRAST TECHNIQUE: Contiguous axial images were obtained from the base of the skull through the vertex without intravenous contrast. COMPARISON:  02/19/2017 FINDINGS: Brain: No evidence of acute gray matter infarction, hemorrhage, hydrocephalus, extra-axial collection or mass lesion/mass effect. Confluent chronic small vessel ischemia in the deep cerebral white matter. Increased number of chronic appearing bilateral cerebellar infarcts. Age congruent brain volume Vascular: No hyperdense vessel or unexpected calcification. Skull: Normal. Negative for fracture or focal lesion. Sinuses/Orbits: Bilateral cataract resection. IMPRESSION: 1. No acute finding. 2. Extensive chronic small vessel disease. Electronically Signed   By: Monte Fantasia M.D.   On: 01/27/2021 07:23   CT ANGIO NECK W OR WO CONTRAST  Result Date: 01/28/2021 CLINICAL DATA:  Stroke. TIA. Left cerebellar and basal ganglia strokes. EXAM: CT ANGIOGRAPHY HEAD AND NECK TECHNIQUE: Multidetector CT imaging of the head and neck was performed using the standard protocol during bolus administration of intravenous contrast. Multiplanar CT image reconstructions and MIPs were obtained to evaluate the  vascular anatomy. Carotid stenosis measurements (when applicable) are obtained utilizing NASCET criteria, using the distal internal carotid diameter as the denominator. CONTRAST:  8mL OMNIPAQUE IOHEXOL 350 MG/ML SOLN COMPARISON:  CT and MRI studies done yesterday. FINDINGS: CT HEAD FINDINGS Brain: Focal low-density now evident in the left cerebellum. No evidence of hemorrhagic transformation. Cerebral hemispheres show a background pattern of extensive chronic small vessel disease of the white matter. The acute infarction in the left basal ganglia and radiating white matter tracts is barely discernible. No hemorrhagic transformation. No new insult. No hydrocephalus or extra-axial collection. Vascular: There is atherosclerotic calcification of the major vessels at the base of the brain. Skull: Negative Sinuses: Clear Orbits: Normal Review of the MIP images confirms the above findings CTA NECK FINDINGS Aortic arch: Aortic atherosclerotic calcification. Branching pattern is  normal without flow limiting stenosis. Right carotid system: Common carotid artery shows atherosclerotic plaque but without flow limiting stenosis proximal to the bifurcation. Dense calcified plaque at the carotid bifurcation and ICA bulb. Minimal diameter in the ICA bulb is 1.5 mm. Compared to a more distal cervical ICA of 5 mm, this indicates a 70% stenosis. Left carotid system: Common carotid artery shows scattered plaque but is sufficiently patent to the bifurcation region. Dense calcified plaque at the carotid bifurcation and ICA bulb. Minimal diameter in the ICA bulb is 2.5 mm. Compared to a more distal cervical ICA diameter of 5 mm, this indicates a 50% stenosis. Vertebral arteries: Both subclavian arteries show atherosclerotic disease but without a flow limiting stenosis. Both vertebral arteries are patent with stenosis of 50% at their origins. Beyond that, the vessels are patent through the cervical region to the foramen magnum. Skeleton:  Ordinary cervical spondylosis. Chronic arthropathy at the C1-2 articulation. Other neck: No mass or lymphadenopathy in the neck. Upper chest: Mild scarring at the lung apices.  No active process. Review of the MIP images confirms the above findings CTA HEAD FINDINGS Anterior circulation: Both internal carotid arteries are patent through the skull base and siphon regions. There is calcification in the carotid siphon regions with stenosis estimated at 30% on both sides. The anterior and middle cerebral vessels are patent without proximal stenosis, aneurysm or vascular malformation. No large or medium vessel occlusion identified. Posterior circulation: Both vertebral arteries are patent through the foramen magnum to the basilar. Focal plaque in the left vertebral V4 segment with stenosis of 30%. No basilar stenosis. Major posterior circulation branch vessels are patent. Left PCA receives its supply from the anterior circulation. Venous sinuses: Patent and normal. Anatomic variants: None Review of the MIP images confirms the above findings IMPRESSION: 1. Dense calcified plaque at both carotid bifurcations and ICA bulb regions. 70% stenosis of the ICA bulb on the right. 50% stenosis of the ICA bulb on the left. 2. 50% stenoses at both vertebral artery origins. 30% stenosis of the left vertebral V4 segment. 3. No intracranial large or medium vessel occlusion or correctable proximal stenosis. Aortic Atherosclerosis (ICD10-I70.0). Electronically Signed   By: Nelson Chimes M.D.   On: 01/28/2021 19:56   MR BRAIN WO CONTRAST  Result Date: 02/03/2021 CLINICAL DATA:  Delirium.  Recent stroke. EXAM: MRI HEAD WITHOUT CONTRAST TECHNIQUE: Multiplanar, multiecho pulse sequences of the brain and surrounding structures were obtained without intravenous contrast. COMPARISON:  Head CT 02/01/2021 and MRI 01/27/2021 FINDINGS: Brain: Recent infarcts are again noted in the left corona radiata and left cerebellum. The left corona radiata  infarct has slightly enlarged from the prior MRI while the cerebellar infarct is unchanged. No new infarct is identified elsewhere. Background patchy to confluent T2 hyperintensities in the cerebral white matter bilaterally are unchanged and nonspecific but compatible with moderate chronic small-vessel ischemic disease. Chronic bilateral cerebellar infarcts are again noted. There is moderate cerebral atrophy. Vascular: Major intracranial vascular flow voids are preserved. Skull and upper cervical spine: Unremarkable bone marrow signal. Sinuses/Orbits: Bilateral cataract extraction. Paranasal sinuses and mastoid air cells are clear. Other: None. IMPRESSION: 1. Slight enlargement of the left corona radiata infarct since the 01/27/2021 MRI. 2. Unchanged subacute left cerebellar infarct. 3. Moderate chronic small-vessel ischemic disease. Electronically Signed   By: Logan Bores M.D.   On: 02/03/2021 13:04   MR BRAIN WO CONTRAST  Addendum Date: 01/27/2021   ADDENDUM REPORT: 01/27/2021 12:00 ADDENDUM: These results were called by telephone  at the time of interpretation on 01/27/2021 at 12:00 pm to provider [facility] , who verbally acknowledged these results. Electronically Signed   By: Pedro Earls M.D.   On: 01/27/2021 12:00   Result Date: 01/27/2021 CLINICAL DATA:  Stroke suspected. EXAM: MRI HEAD WITHOUT CONTRAST TECHNIQUE: Multiplanar, multiecho pulse sequences of the brain and surrounding structures were obtained without intravenous contrast. COMPARISON:  Head CT January 27, 2021. FINDINGS: Brain: Areas of restricted diffusion within the left cerebellar hemisphere and left caudate body extending into the lenticulocapsular region, consistent with acute infarct. No hemorrhage, hydrocephalus, extra-axial collection or mass lesion. Scattered and confluent foci of T2 hyperintensity are seen within the white matter of the cerebral hemispheres, nonspecific, most likely related to chronic microvascular  ischemic changes. Remote lacunar infarcts in the bilateral cerebellar hemispheres. Vascular: Normal flow voids. Skull and upper cervical spine: Normal marrow signal. Sinuses/Orbits: Bilateral lens surgery. Paranasal sinuses are clear. IMPRESSION: 1. Areas of restricted diffusion within the left cerebellar hemisphere and left caudate body extending into the lenticulocapsular region, consistent with acute infarcts. 2. Remote lacunar infarcts in the bilateral cerebellar hemispheres. 3. Moderate chronic microvascular ischemic changes. Electronically Signed: By: Pedro Earls M.D. On: 01/27/2021 11:54   ECHOCARDIOGRAM COMPLETE  Result Date: 01/29/2021    ECHOCARDIOGRAM REPORT   Patient Name:   ARYSSA ROSAMOND Date of Exam: 01/29/2021 Medical Rec #:  [id number]           Height:       62.0 in Accession #:    [id number]          Weight:       141.8 lb Date of Birth:  July 07, 1928            BSA:          1.652 m Patient Age:    69 years            BP:           161/92 mmHg Patient Gender: F                   HR:           116 bpm. Exam Location:  Inpatient Procedure: 2D Echo Indications:    Stroke 434.91 / I163.9  History:        Patient has prior history of Echocardiogram examinations, most                 recent 01/17/2012. CAD, Prior CABG, Stroke, Arrythmias:Atrial                 Fibrillation; Risk Factors:Dyslipidemia, Former Smoker and                 Hypertension. Sepsis, 19 mm Magna Ease pericardial tissue valve                 12/28/2011.                 Aortic Valve: 19 mm Edwards MagnaEase valve is present in the                 aortic position.  Sonographer:    Leavy Cella Referring Phys: [place] Comments: Restricted Mobility. IMPRESSIONS  1. Left ventricular ejection fraction, by estimation, is 60 to 65%. The left ventricle has normal function. The left ventricle has no regional wall motion abnormalities. Left ventricular diastolic function could not be evaluated.   2.  Right ventricular systolic function is normal. The right ventricular size is normal. There is normal pulmonary artery systolic pressure.  3. The mitral valve is abnormal. Trivial mitral valve regurgitation. Moderate mitral annular calcification.  4. The aortic valve has been repaired/replaced. Aortic valve regurgitation is trivial. There is a 19 mm Edwards MagnaEase valve present in the aortic position. Echo findings are consistent with normal structure and function of the aortic valve prosthesis. Aortic valve mean gradient measures 14.3 mmHg. Aortic valve Vmax measures 2.44 m/s.  5. The inferior vena cava is normal in size with greater than 50% respiratory variability, suggesting right atrial pressure of 3 mmHg. Comparison(s): Prior images unable to be directly viewed, comparison made by report only. Changes from prior study are noted. 01/17/12: LVEF 55-60%, aortic bioprosthesis -mean gradient 22 mmHg. FINDINGS  Left Ventricle: Left ventricular ejection fraction, by estimation, is 60 to 65%. The left ventricle has normal function. The left ventricle has no regional wall motion abnormalities. The left ventricular internal cavity size was normal in size. There is  no left ventricular hypertrophy. Left ventricular diastolic function could not be evaluated due to atrial fibrillation. Left ventricular diastolic function could not be evaluated. Right Ventricle: The right ventricular size is normal. No increase in right ventricular wall thickness. Right ventricular systolic function is normal. There is normal pulmonary artery systolic pressure. The tricuspid regurgitant velocity is 2.43 m/s, and  with an assumed right atrial pressure of 3 mmHg, the estimated right ventricular systolic pressure is 20.2 mmHg. Left Atrium: Left atrial size was normal in size. Right Atrium: Right atrial size was normal in size. Pericardium: There is no evidence of pericardial effusion. Mitral Valve: The mitral valve is abnormal. There is  mild thickening of the mitral valve leaflet(s). Moderate mitral annular calcification. Trivial mitral valve regurgitation. Tricuspid Valve: The tricuspid valve is grossly normal. Tricuspid valve regurgitation is trivial. Aortic Valve: The aortic valve has been repaired/replaced. Aortic valve regurgitation is trivial. Aortic valve mean gradient measures 14.3 mmHg. Aortic valve peak gradient measures 23.9 mmHg. Aortic valve area, by VTI measures 0.53 cm. There is a 19 mm Edwards MagnaEase valve present in the aortic position. Echo findings are consistent with normal structure and function of the aortic valve prosthesis. Pulmonic Valve: The pulmonic valve was normal in structure. Pulmonic valve regurgitation is trivial. Aorta: The aortic root and ascending aorta are structurally normal, with no evidence of dilitation. Venous: The inferior vena cava is normal in size with greater than 50% respiratory variability, suggesting right atrial pressure of 3 mmHg. IAS/Shunts: No atrial level shunt detected by color flow Doppler.  LEFT VENTRICLE PLAX 2D LVOT diam:     1.90 cm  Diastology LV SV:         23       LV e' medial:   3.70 cm/s LV SV Index:   14       LV E/e' medial: 34.3 LVOT Area:     2.84 cm  RIGHT VENTRICLE RV S prime:     14.20 cm/s TAPSE (M-mode): 1.5 cm LEFT ATRIUM             Index       RIGHT ATRIUM           Index LA diam:        3.60 cm 2.18 cm/m  RA Area:     16.20 cm LA Vol (A2C):   49.1 ml 29.73 ml/m RA Volume:   46.60 ml  28.22 ml/m LA  Vol (A4C):   47.9 ml 29.00 ml/m LA Biplane Vol: 53.2 ml 32.21 ml/m  AORTIC VALVE AV Area (Vmax):    0.59 cm AV Area (Vmean):   0.57 cm AV Area (VTI):     0.53 cm AV Vmax:           244.33 cm/s AV Vmean:          174.000 cm/s AV VTI:            0.432 m AV Peak Grad:      23.9 mmHg AV Mean Grad:      14.3 mmHg LVOT Vmax:         51.20 cm/s LVOT Vmean:        35.267 cm/s LVOT VTI:          0.080 m LVOT/AV VTI ratio: 0.19  AORTA Ao Root diam: 2.40 cm MITRAL VALVE                 TRICUSPID VALVE MV Area (PHT): 6.71 cm     TR Peak grad:   23.6 mmHg MV Decel Time: 113 msec     TR Vmax:        243.00 cm/s MV E velocity: 127.00 cm/s MV A velocity: 58.10 cm/s   SHUNTS MV E/A ratio:  2.19         Systemic VTI:  0.08 m                             Systemic Diam: 1.90 cm Lyman Bishop MD Electronically signed by Lyman Bishop MD Signature Date/Time: 01/29/2021/12:51:58 PM    Final    VAS US CAROTID (at [facility] and [facility] only)  Result Date: 01/30/2021 Carotid Arterial Duplex Study Indications:       CVA. Risk Factors:      Hypertension, hyperlipidemia, coronary artery disease. Comparison Study:  01-22-2017 Prior carotid artery duplex study showed 1-39%                    stenosis bilaterally. Performing Technologist: Darlin Coco RDMS  Examination Guidelines: A complete evaluation includes B-mode imaging, spectral Doppler, color Doppler, and power Doppler as needed of all accessible portions of each vessel. Bilateral testing is considered an integral part of a complete examination. Limited examinations for reoccurring indications may be performed as noted.  Right Carotid Findings: +----------+--------+--------+--------+------------------+---------------------+           PSV cm/sEDV cm/sStenosisPlaque DescriptionComments              +----------+--------+--------+--------+------------------+---------------------+ CCA Prox  89      16                                                      +----------+--------+--------+--------+------------------+---------------------+ CCA Distal69      16              calcific                                +----------+--------+--------+--------+------------------+---------------------+ ICA Prox  131     31      1-39%   calcific          Velocities may  underestimate degree                                                      of stenosis due to                                                         more proximal                                                             obstruction.          +----------+--------+--------+--------+------------------+---------------------+ ICA Mid   133     25                                                      +----------+--------+--------+--------+------------------+---------------------+ ICA Distal104     23                                                      +----------+--------+--------+--------+------------------+---------------------+ ECA       246             >50%    calcific                                +----------+--------+--------+--------+------------------+---------------------+ +----------+--------+-------+----------------+-------------------+           PSV cm/sEDV cmsDescribe        Arm Pressure (mmHG) +----------+--------+-------+----------------+-------------------+ [id number]            Multiphasic, WNL                    +----------+--------+-------+----------------+-------------------+ +---------+--------+--+--------+--+---------+ VertebralPSV cm/s44EDV cm/s12Antegrade +---------+--------+--+--------+--+---------+  Left Carotid Findings: +----------+--------+--------+--------+------------------+--------+           PSV cm/sEDV cm/sStenosisPlaque DescriptionComments +----------+--------+--------+--------+------------------+--------+ CCA Prox  101     18                                         +----------+--------+--------+--------+------------------+--------+ CCA Distal74      13              calcific                   +----------+--------+--------+--------+------------------+--------+ ICA Prox  140     18      1-39%   calcific                   +----------+--------+--------+--------+------------------+--------+ ICA Mid   126     23                                          +----------+--------+--------+--------+------------------+--------+  ICA Distal104     20                                         +----------+--------+--------+--------+------------------+--------+ ECA       260             >50%    calcific                   +----------+--------+--------+--------+------------------+--------+ +----------+--------+--------+----------------+-------------------+           PSV cm/sEDV cm/sDescribe        Arm Pressure (mmHG) +----------+--------+--------+----------------+-------------------+ [id number]             Multiphasic, WNL                    +----------+--------+--------+----------------+-------------------+ +---------+--------+--+--------+--+---------+ VertebralPSV cm/s97EDV cm/s15Antegrade +---------+--------+--+--------+--+---------+   Summary: Right Carotid: Velocities in the right ICA are consistent with a 1-39% stenosis.                The ECA appears >50% stenosed. Left Carotid: Velocities in the left ICA are consistent with a 1-39% stenosis.               The ECA appears >50% stenosed. Vertebrals:  Bilateral vertebral arteries demonstrate antegrade flow. Subclavians: Normal flow hemodynamics were seen in bilateral subclavian              arteries. *See table(s) above for measurements and observations.  Electronically signed by Antony Contras MD on 01/30/2021 at 8:49:27 AM.    Final     Assessment/Plan Autoimmune hemolytic anemia (HCC) Anemia chronic/Autoimmune hemolytic anemia, Hgb 8.2 02/16/21, on Prednisone. Repeat CBC   Hypothyroidism  takes Levothyroxine, cortisol unremarkable TSH 3.494 01/28/21   Atrial fibrillation (HCC) EF 50-65%, LDL 48. F/u cardiology, takes Metoprolol, Eliquis.   Chronic diastolic heart failure (HCC)  euvolemic orthostaticBun/creat 22/0.7 02/16/21,  EF 50-65%,   CAD (coronary artery disease) Status post CABG in 2013, has history of severe aortic stenosis status post tissue AVR in 2013. Follows with  cardiology.  Constipation  takes Senokot, MiraLax, Colace   Overactive bladder  takes Oxybutynin   Sciatica Only lower back pain when sitting in w/c for a while, no pain travels to legs, GDR: decrease Gabapentin 100mg  qhs in setting of lethargy/sleepiness, may resume if pain worsens.    Memory problem Confusion, in hospital, CT head 02/01/21 no acute intracranial abnormality. UTI, acute CVA, underlying dementia may be contributory. F/u neurology.    Orthostatic hypotension Orthostatic hypotension,   Lethargy Lethargy, seems better today,  CXR 02/10/21 no acute cardiopulmonary disease.   Acute CVA (cerebrovascular accident) ([place]) Acute CVA 01/2021 with R leg weakness ,trouble with word finding, per MRI left cerebral, embolic hemisphere, Eliquis was increased to 5mg  bid, takes Atorvastatin.    Hyponatremia Hyponatremia, improved, on 1551ml/day fluid restriction, Na 134 02/16/21    Family/ staff Communication: plan of care reviewed with the patient and charge nurse.   Labs/tests ordered: CBC   Time spend 35 minutes.

## 2021-02-21 NOTE — Assessment & Plan Note (Signed)
Confusion, in hospital, CT head 02/01/21 no acute intracranial abnormality. UTI, acute CVA, underlying dementia may be contributory. F/u neurology.

## 2021-02-21 NOTE — Assessment & Plan Note (Signed)
Status post CABG in 2013, has history of severe aortic stenosis status post tissue AVR in 2013. Follows with cardiology.

## 2021-02-21 NOTE — Assessment & Plan Note (Addendum)
euvolemic orthostaticBun/creat 22/0.7 02/16/21,  EF 50-65%,

## 2021-02-21 NOTE — Assessment & Plan Note (Signed)
takes Senokot, MiraLax, Colace

## 2021-02-21 NOTE — Assessment & Plan Note (Addendum)
Hyponatremia, improved, on 1555ml/day fluid restriction, Na 134 02/16/21

## 2021-02-21 NOTE — Assessment & Plan Note (Addendum)
Only lower back pain when sitting in w/c for a while, no pain travels to legs, GDR: decrease Gabapentin 100mg  qhs in setting of lethargy/sleepiness, may resume if pain worsens.

## 2021-02-21 NOTE — Assessment & Plan Note (Signed)
Acute CVA 01/2021 with R leg weakness ,trouble with word finding, per MRI left cerebral, embolic hemisphere, Eliquis was increased to 5mg  bid, takes Atorvastatin.

## 2021-02-21 NOTE — Assessment & Plan Note (Signed)
Lethargy, seems better today,  CXR 02/10/21 no acute cardiopulmonary disease.

## 2021-02-21 NOTE — Assessment & Plan Note (Signed)
EF 50-65%, LDL 48. F/u cardiology, takes Metoprolol, Eliquis.

## 2021-02-21 NOTE — Assessment & Plan Note (Signed)
takes Levothyroxine, cortisol unremarkable TSH 3.494 01/28/21

## 2021-02-21 NOTE — Assessment & Plan Note (Signed)
takes Oxybutynin

## 2021-02-21 NOTE — Assessment & Plan Note (Addendum)
Anemia chronic/Autoimmune hemolytic anemia, Hgb 8.2 02/16/21, on Prednisone. Repeat CBC

## 2021-02-21 DEATH — deceased

## 2021-02-23 ENCOUNTER — Non-Acute Institutional Stay (SKILLED_NURSING_FACILITY): Payer: Medicare Other | Admitting: Internal Medicine

## 2021-02-23 DIAGNOSIS — I5032 Chronic diastolic (congestive) heart failure: Secondary | ICD-10-CM | POA: Diagnosis not present

## 2021-02-23 DIAGNOSIS — E039 Hypothyroidism, unspecified: Secondary | ICD-10-CM | POA: Diagnosis not present

## 2021-02-23 DIAGNOSIS — I4819 Other persistent atrial fibrillation: Secondary | ICD-10-CM | POA: Diagnosis not present

## 2021-02-23 DIAGNOSIS — I2510 Atherosclerotic heart disease of native coronary artery without angina pectoris: Secondary | ICD-10-CM | POA: Diagnosis not present

## 2021-02-23 DIAGNOSIS — I639 Cerebral infarction, unspecified: Secondary | ICD-10-CM | POA: Diagnosis not present

## 2021-02-23 DIAGNOSIS — D591 Autoimmune hemolytic anemia, unspecified: Secondary | ICD-10-CM | POA: Diagnosis not present

## 2021-02-24 ENCOUNTER — Inpatient Hospital Stay: Payer: Medicare Other | Attending: Oncology

## 2021-02-24 ENCOUNTER — Other Ambulatory Visit: Payer: Self-pay

## 2021-02-24 ENCOUNTER — Telehealth: Payer: Self-pay | Admitting: Oncology

## 2021-02-24 ENCOUNTER — Inpatient Hospital Stay (HOSPITAL_BASED_OUTPATIENT_CLINIC_OR_DEPARTMENT_OTHER): Payer: Medicare Other | Admitting: Nurse Practitioner

## 2021-02-24 ENCOUNTER — Telehealth: Payer: Self-pay | Admitting: *Deleted

## 2021-02-24 ENCOUNTER — Encounter: Payer: Self-pay | Admitting: Nurse Practitioner

## 2021-02-24 ENCOUNTER — Encounter: Payer: Self-pay | Admitting: Internal Medicine

## 2021-02-24 VITALS — BP 114/77 | HR 72 | Temp 98.1°F | Resp 16 | Ht 64.0 in

## 2021-02-24 DIAGNOSIS — Z7901 Long term (current) use of anticoagulants: Secondary | ICD-10-CM | POA: Diagnosis not present

## 2021-02-24 DIAGNOSIS — I639 Cerebral infarction, unspecified: Secondary | ICD-10-CM | POA: Diagnosis not present

## 2021-02-24 DIAGNOSIS — Z8673 Personal history of transient ischemic attack (TIA), and cerebral infarction without residual deficits: Secondary | ICD-10-CM | POA: Diagnosis not present

## 2021-02-24 DIAGNOSIS — D591 Autoimmune hemolytic anemia, unspecified: Secondary | ICD-10-CM | POA: Insufficient documentation

## 2021-02-24 DIAGNOSIS — E785 Hyperlipidemia, unspecified: Secondary | ICD-10-CM | POA: Insufficient documentation

## 2021-02-24 DIAGNOSIS — E039 Hypothyroidism, unspecified: Secondary | ICD-10-CM | POA: Diagnosis not present

## 2021-02-24 LAB — CMP (CANCER CENTER ONLY)
ALT: 18 U/L (ref 0–44)
AST: 27 U/L (ref 15–41)
Albumin: 3 g/dL — ABNORMAL LOW (ref 3.5–5.0)
Alkaline Phosphatase: 67 U/L (ref 38–126)
Anion gap: 9 (ref 5–15)
BUN: 32 mg/dL — ABNORMAL HIGH (ref 8–23)
CO2: 22 mmol/L (ref 22–32)
Calcium: 8.9 mg/dL (ref 8.9–10.3)
Chloride: 100 mmol/L (ref 98–111)
Creatinine: 1.03 mg/dL — ABNORMAL HIGH (ref 0.44–1.00)
GFR, Estimated: 51 mL/min — ABNORMAL LOW (ref >60)
Glucose, Bld: 165 mg/dL — ABNORMAL HIGH (ref 70–99)
Potassium: 4.2 mmol/L (ref 3.5–5.1)
Sodium: 131 mmol/L — ABNORMAL LOW (ref 135–145)
Total Bilirubin: 1.2 mg/dL (ref 0.3–1.2)
Total Protein: 6.2 g/dL — ABNORMAL LOW (ref 6.5–8.1)

## 2021-02-24 LAB — PREPARE RBC (CROSSMATCH)

## 2021-02-24 LAB — CBC WITH DIFFERENTIAL (CANCER CENTER ONLY)
Abs Immature Granulocytes: 0.06 10*3/uL (ref 0.00–0.07)
Basophils Absolute: 0 10*3/uL (ref 0.0–0.1)
Basophils Relative: 0 %
Eosinophils Absolute: 0 10*3/uL (ref 0.0–0.5)
Eosinophils Relative: 0 %
HCT: 25.9 % — ABNORMAL LOW (ref 36.0–46.0)
Hemoglobin: 8 g/dL — ABNORMAL LOW (ref 12.0–15.0)
Immature Granulocytes: 1 %
Lymphocytes Relative: 13 %
Lymphs Abs: 1.3 10*3/uL (ref 0.7–4.0)
MCH: 28 pg (ref 26.0–34.0)
MCHC: 30.9 g/dL (ref 30.0–36.0)
MCV: 90.6 fL (ref 80.0–100.0)
Monocytes Absolute: 0.6 10*3/uL (ref 0.1–1.0)
Monocytes Relative: 5 %
Neutro Abs: 8.3 10*3/uL — ABNORMAL HIGH (ref 1.7–7.7)
Neutrophils Relative %: 81 %
Platelet Count: 279 10*3/uL (ref 150–400)
RBC: 2.86 MIL/uL — ABNORMAL LOW (ref 3.87–5.11)
RDW: 18.7 % — ABNORMAL HIGH (ref 11.5–15.5)
WBC Count: 10.3 10*3/uL (ref 4.0–10.5)
nRBC: 0 % (ref 0.0–0.2)

## 2021-02-24 LAB — RETICULOCYTES
Immature Retic Fract: 26.7 % — ABNORMAL HIGH (ref 2.3–15.9)
RBC.: 2.7 MIL/uL — ABNORMAL LOW (ref 3.87–5.11)
Retic Count, Absolute: 237.5 10*3/uL — ABNORMAL HIGH (ref 19.0–186.0)
Retic Ct Pct: 8.8 % — ABNORMAL HIGH (ref 0.4–3.1)

## 2021-02-24 LAB — LACTATE DEHYDROGENASE: LDH: 253 U/L — ABNORMAL HIGH (ref 98–192)

## 2021-02-24 LAB — SAMPLE TO BLOOD BANK

## 2021-02-24 NOTE — Progress Notes (Signed)
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 Yes  Type of Paramedic of [place];Living will  Does patient want to make changes to medical advance directive? No - Guardian declined  Copy of [place] in Chart? -  Would patient like information on creating a medical advance directive? -  Pre-existing out of facility DNR order (yellow form or pink MOST form) -     Chief Complaint  Patient presents with  . Acute Visit    HPI: Patient is a 87 y.o. female seen today for an acute visit for Anemia  Patient was admitted in the hospital from 2/4-2/14 with acute nonhemorrhagic CVA,andmetabolic encephalopathy Her stroke was thought to be embolic. Her Eliquis was increased to 5 mg twice daily  Patient has a history of paroxysmal A. fib on was on Eliquis, hypertension History of advanced AV block mostly at night, carotid stenosis, S/P AVR History of AIHA on chronic prednisone  Anemia Had lab done today and her Hgb is down from 9.4 to 8.3 and now 7.3 Patient does feel weak and tired. Low Endurance per Therapy Not seen any other signs of Bleeding Denies Chest pain or SOB S/P CVA Per therapy doing some better. But still complete assist for her ADLS Appetite is good  Past Medical History:  Diagnosis Date  . Acute blood loss anemia 05/16/2018  . AIHA (autoimmune hemolytic anemia) (HCC)   . Allergic rhinitis due to pollen 01/18/2021  . Anxiety disorder 01/18/2021  . Aortic stenosis   . Atherosclerotic heart disease of native coronary artery without angina pectoris 01/18/2021  . Atrial fibrillation ([place])    Noted during recent cardiac surgery and treated with amiodarone   . Autoimmune hemolytic anemia (HCC)   . AV heart block 11/23/2020  . CAD (coronary artery disease) 12/26/2011   Severe ostial LM disease  noted at cath 12/26/11   . Chronic diastolic heart failure ([place]) 01/17/2012  . Complication of surgical procedure 01/18/2021  . Constipation 01/18/2021  . Coronary artery disease    Cath 1/13 left main  . 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 Dyspnea   . Elevated bilirubin 05/14/2018  . Essential hypertension 01/18/2021  . Hypercoagulable state ([place]) 01/18/2021  . Hyperlipidemia   . Hypertension   . Hypertensive heart disease without CHF   . Hyponatremia 01/17/2012  . Hypothyroidism   . Long term (current) use of anticoagulants 07/14/2013  . Lumbar degenerative disc disease    Prior lumbar laminectomy 12/2009 Dr. Rolena Infante.  Failed epidural steroids.  Currently on gabapentin   . Lumbar disc disease   . Lung nodules    Sees [facility], thought to be benign  . Memory problem 01/18/2021  . Mixed hyperlipidemia 01/18/2021  . Obesity   . Obstructive jaundice 05/14/2018  . Overactive bladder 01/18/2021  . PAF (paroxysmal atrial fibrillation) ([place]) 01/30/2019  . Paroxysmal atrial fibrillation ([place]) 01/30/2019  . Personal history of solitary pulmonary nodule 11/07/2011   Followed in Pulmonary clinic/ [facility]/ [facility]   - PET 11/19/11 indeterminant LUL nodule but slt reduced in size vs prev studies so rec f/u cxr in 2 months (tickle file) Wedge resection by Dr. Servando Snare at time of AVR/CABG 12/28/2011 >   wedge biopsy/resection, Left upper lobe - NON-NECROTIZING GRANULOMATOUS INFLAMMATION WITH ASSOCIATED MULTINUCLEATED GIANT CELLS.   . Polyp  of colon 01/18/2021  . S/P AVR and CABG 12/28/2011   19 mm Magna Ease pericardial tissue valve CABG x 3 (LIMA-LAD, SVG-Int, SVG-dRCA)12/28/2011 Dr. Servando Snare    . Sciatica 01/18/2021  . Senile purpura ([place]) 01/18/2021  . Sepsis ([place]) 08/17/2018  . Spinal stenosis   . Villous adenoma of rectum     Past Surgical History:  Procedure Laterality Date  . AORTIC VALVE REPLACEMENT  12/28/2011   Procedure: AORTIC VALVE REPLACEMENT (AVR);  Surgeon: Grace Isaac, MD;  Location: [place];  Service: Open Heart Surgery;  Laterality: N/A;  . CARDIAC CATHETERIZATION  2013  . CHOLECYSTECTOMY    . CORONARY ARTERY BYPASS GRAFT  12/28/2011   Procedure: CORONARY ARTERY BYPASS GRAFTING (CABG);  Surgeon: Grace Isaac, MD;  Location: [place];  Service: Open Heart Surgery;  Laterality: N/A;  . HERNIA REPAIR    . LEFT AND RIGHT HEART CATHETERIZATION WITH CORONARY ANGIOGRAM N/A 12/26/2011   Procedure: LEFT AND RIGHT HEART CATHETERIZATION WITH CORONARY ANGIOGRAM;  Surgeon: Jacolyn Reedy, MD;  Location: [facility] CATH LAB;  Service: Cardiovascular;  Laterality: N/A;  . LUMBAR LAMINECTOMY    . PARTIAL COLECTOMY  2008   Villous adenoma    Allergies  Allergen Reactions  . Ibuprofen Other (See Comments)    Hx of GI bleed  . Tapentadol Other (See Comments)    Nucynta- Knows she "cannot take"- made the patient "very sick"   . Ace Inhibitors Cough  . Angiotensin Receptor Blockers Cough  . Dicloxacillin Nausea Only and Other (See Comments)    Made the patient lightheaded, also  . Doxycycline Nausea Only  . Hydrocodone Nausea Only and Other (See Comments)    Made the patient lightheaded, also  . Nsaids     Can tolerate only Tylenol (has a history of GI BLEEDS)  . Adhesive [Tape] Rash    Blisters (can use only paper tape)  . Latex Rash and Other (See Comments)    Paper tape only    Outpatient Encounter Medications as of 02/23/2021  Medication Sig  . acetaminophen (TYLENOL) 325 MG tablet Take 650 mg by mouth every morning.  Marland Kitchen apixaban (ELIQUIS) 5 MG TABS tablet Take 1 tablet (5 mg total) by mouth 2 (two) times daily.  Marland Kitchen atorvastatin (LIPITOR) 20 MG tablet TAKE 1 TABLET BY MOUTH EVERYDAY AT BEDTIME  . BESIVANCE 0.6 % SUSP Place 1 drop into the left eye See admin instructions. Place 1 drop left eye 4 times daily for 2 days after Avastin eye injections  . Calcium Carbonate-Vitamin D (CALCIUM 600 + D PO) Take 1 tablet by mouth every morning.  . cetirizine (ZYRTEC)  10 MG tablet Take 10 mg by mouth daily in the afternoon.  . docusate sodium (COLACE) 100 MG capsule Take 100 mg by mouth daily in the afternoon.  . feeding supplement (BOOST HIGH PROTEIN) LIQD Take 1 Container by mouth daily.  Marland Kitchen gabapentin (NEURONTIN) 100 MG capsule Take 100 mg by mouth daily.  Marland Kitchen levothyroxine (SYNTHROID, LEVOTHROID) 88 MCG tablet Take 88 mcg by mouth daily before breakfast.   . metoprolol tartrate (LOPRESSOR) 50 MG tablet Take 1 tablet (50 mg total) by mouth at bedtime.  . Metoprolol Tartrate 75 MG TABS Take 1 tablet by mouth daily.  . Multiple Vitamins-Minerals (MULTI FOR HER 50+) TABS Take 1 tablet by mouth daily in the afternoon.  . nystatin (MYCOSTATIN/NYSTOP) powder Apply topically 2 (two) times daily.  Marland Kitchen oxybutynin (DITROPAN) 5 MG tablet Take 5 mg by mouth  every other day.  . polyethylene glycol (MIRALAX / GLYCOLAX) 17 g packet Take 17 g by mouth 2 (two) times daily.  . predniSONE (DELTASONE) 10 MG tablet Take 60 mg by mouth daily with breakfast.  . SANTYL ointment Apply 1 application topically See admin instructions. Apply daily as directed to the affected toe  . senna (SENOKOT) 8.6 MG TABS tablet Take 2 tablets (17.2 mg total) by mouth daily.  Marland Kitchen zinc oxide 20 % ointment Apply 1 application topically as needed for irritation.   No facility-administered encounter medications on file as of 02/23/2021.    Review of Systems:  Review of Systems  Constitutional: Positive for activity change.  HENT: Negative.   Respiratory: Negative.   Cardiovascular: Negative.   Gastrointestinal: Negative.   Genitourinary: Negative.   Musculoskeletal: Positive for gait problem.  Skin: Positive for color change.  Neurological: Positive for weakness.  Psychiatric/Behavioral: Positive for confusion.    Health Maintenance  Topic Date Due  . DEXA SCAN  Never done  . COVID-19 Vaccine (3 - Booster for Moderna series) 07/24/2020  . TETANUS/TDAP  10/20/2025  . INFLUENZA VACCINE   Completed  . PNA vac Low Risk Adult  Completed  . HPV VACCINES  Aged Out    Physical Exam: Vitals:   02/24/21 0813  BP: 135/68  Pulse: 83  Resp: 18  Temp: 98.7 F (37.1 C)   There is no height or weight on file to calculate BMI. Physical Exam Vitals reviewed.  Constitutional:      Appearance: Normal appearance.  HENT:     Head: Normocephalic.     Nose: Nose normal.     Mouth/Throat:     Mouth: Mucous membranes are moist.     Pharynx: Oropharynx is clear.  Eyes:     Pupils: Pupils are equal, round, and reactive to light.  Cardiovascular:     Rate and Rhythm: Normal rate.     Pulses: Normal pulses.  Pulmonary:     Effort: Pulmonary effort is normal. No respiratory distress.     Breath sounds: Normal breath sounds.  Abdominal:     General: Abdomen is flat. Bowel sounds are normal.     Palpations: Abdomen is soft.  Musculoskeletal:        General: No swelling.     Cervical back: Neck supple.     Comments: Has skin tears and Bruises in her Hands and Legs  Skin:    General: Skin is warm and dry.  Neurological:     Mental Status: She is alert.     Comments:  Right Sided 2/5 strength in UE and LE Left was 3/5 in UE and LE Speech is slurred Right facial Droop Is more Alert today  Psychiatric:        Mood and Affect: Mood normal.        Thought Content: Thought content normal.     Labs reviewed: Basic Metabolic Panel: Recent Labs    11/04/20 1441 11/28/20 1144 01/28/21 0301 01/29/21 0202 01/30/21 0253 01/31/21 1214 02/01/21 0337 02/04/21 0518 02/05/21 0631 02/11/2021 0258 02/09/21 0000 02/13/21 0000 02/16/21 0000  NA 132*   < >  --  131*   < >  --    < > 134* 136 136 128* 132* 134*  K 4.7   < >  --  3.8   < >  --    < > 3.9 4.0 3.9 4.5 4.1 4.0  CL 93*   < >  --  98   < >  --    < >  102 102 101 91* 95* 97*  CO2 26   < >  --  25   < >  --    < > 23 26 26  31* 31* 29*  GLUCOSE 127*   < >  --  108*   < >  --    < > 118* 142* 120*  --   --   --   BUN 27   <  >  --  20   < >  --    < > 33* 27* 19 24* 19 22*  CREATININE 0.98   < >  --  1.00   < >  --    < > 0.91 0.84 0.75 0.9 0.8 0.7  CALCIUM 9.8   < >  --  8.8*   < >  --    < > 8.7* 8.5* 8.6* 9.2 9.0 8.8  MG  --   --   --  2.1  --  2.0  --   --   --   --   --   --   --   TSH 2.440  --  3.494  --   --   --   --   --   --   --   --   --   --    < > = values in this interval not displayed.   Liver Function Tests: Recent Labs    11/28/20 1144 11/28/20 1144 01/27/21 0643 02/09/21 0000 02/13/21 0000 02/16/21 0000  AST 19   < > 22 21 17 15   ALT 14   < > 17 17 11 11   ALKPHOS 50  --  54 72 58 55  BILITOT 0.8  --  1.0  --   --   --   PROT 6.3*  --  6.5  --   --   --   ALBUMIN 3.8  --  3.8 3.3* 3.0* 3.0*   < > = values in this interval not displayed.   No results for input(s): LIPASE, AMYLASE in the last 8760 hours. Recent Labs    02/01/21 1317  AMMONIA 17   CBC: Recent Labs    02/02/21 0809 02/04/21 0518 02/03/2021 0258 02/09/21 0000 02/13/21 0000 02/16/21 0000  WBC 14.0* 10.5 10.3 13.5 11.7 10.1  NEUTROABS 11.0* 7.6 7.3 11,138.00 8,494.00 7,373.00  HGB 10.7* 10.2* 10.3* 10.2* 9.4* 8.2*  HCT 33.4* 32.3* 31.4* 31* 29* 24*  MCV 88.4 88.0 87.0  --   --   --   PLT 234 241 269 295 281 252   Lipid Panel: Recent Labs    01/28/21 0301  CHOL 123  HDL 55  LDLCALC 48  TRIG 102  CHOLHDL 2.2   Lab Results  Component Value Date   HGBA1C 4.5 (L) 01/28/2021    Procedures since last visit: CT ANGIO HEAD W OR WO CONTRAST  Result Date: 01/28/2021 CLINICAL DATA:  Stroke. TIA. Left cerebellar and basal ganglia strokes. EXAM: CT ANGIOGRAPHY HEAD AND NECK TECHNIQUE: Multidetector CT imaging of the head and neck was performed using the standard protocol during bolus administration of intravenous contrast. Multiplanar CT image reconstructions and MIPs were obtained to evaluate the vascular anatomy. Carotid stenosis measurements (when applicable) are obtained utilizing NASCET criteria, using the  distal internal carotid diameter as the denominator. CONTRAST:  53mL OMNIPAQUE IOHEXOL 350 MG/ML SOLN COMPARISON:  CT and MRI studies done yesterday. FINDINGS: CT HEAD FINDINGS Brain: Focal low-density now evident in the left cerebellum.  No evidence of hemorrhagic transformation. Cerebral hemispheres show a background pattern of extensive chronic small vessel disease of the white matter. The acute infarction in the left basal ganglia and radiating white matter tracts is barely discernible. No hemorrhagic transformation. No new insult. No hydrocephalus or extra-axial collection. Vascular: There is atherosclerotic calcification of the major vessels at the base of the brain. Skull: Negative Sinuses: Clear Orbits: Normal Review of the MIP images confirms the above findings CTA NECK FINDINGS Aortic arch: Aortic atherosclerotic calcification. Branching pattern is normal without flow limiting stenosis. Right carotid system: Common carotid artery shows atherosclerotic plaque but without flow limiting stenosis proximal to the bifurcation. Dense calcified plaque at the carotid bifurcation and ICA bulb. Minimal diameter in the ICA bulb is 1.5 mm. Compared to a more distal cervical ICA of 5 mm, this indicates a 70% stenosis. Left carotid system: Common carotid artery shows scattered plaque but is sufficiently patent to the bifurcation region. Dense calcified plaque at the carotid bifurcation and ICA bulb. Minimal diameter in the ICA bulb is 2.5 mm. Compared to a more distal cervical ICA diameter of 5 mm, this indicates a 50% stenosis. Vertebral arteries: Both subclavian arteries show atherosclerotic disease but without a flow limiting stenosis. Both vertebral arteries are patent with stenosis of 50% at their origins. Beyond that, the vessels are patent through the cervical region to the foramen magnum. Skeleton: Ordinary cervical spondylosis. Chronic arthropathy at the C1-2 articulation. Other neck: No mass or lymphadenopathy  in the neck. Upper chest: Mild scarring at the lung apices.  No active process. Review of the MIP images confirms the above findings CTA HEAD FINDINGS Anterior circulation: Both internal carotid arteries are patent through the skull base and siphon regions. There is calcification in the carotid siphon regions with stenosis estimated at 30% on both sides. The anterior and middle cerebral vessels are patent without proximal stenosis, aneurysm or vascular malformation. No large or medium vessel occlusion identified. Posterior circulation: Both vertebral arteries are patent through the foramen magnum to the basilar. Focal plaque in the left vertebral V4 segment with stenosis of 30%. No basilar stenosis. Major posterior circulation branch vessels are patent. Left PCA receives its supply from the anterior circulation. Venous sinuses: Patent and normal. Anatomic variants: None Review of the MIP images confirms the above findings IMPRESSION: 1. Dense calcified plaque at both carotid bifurcations and ICA bulb regions. 70% stenosis of the ICA bulb on the right. 50% stenosis of the ICA bulb on the left. 2. 50% stenoses at both vertebral artery origins. 30% stenosis of the left vertebral V4 segment. 3. No intracranial large or medium vessel occlusion or correctable proximal stenosis. Aortic Atherosclerosis (ICD10-I70.0). Electronically Signed   By: Nelson Chimes M.D.   On: 01/28/2021 19:56   DG Abd 1 View  Result Date: 02/05/2021 CLINICAL DATA:  Abdominal distension EXAM: ABDOMEN - 1 VIEW COMPARISON:  February 01, 2021 FINDINGS: No appreciable bowel dilatation or air-fluid level to suggest bowel obstruction. No free air. Lung bases clear. Postoperative changes noted in the upper abdomen. There is aortic and splenic artery atherosclerotic calcification. IMPRESSION: No bowel obstruction or free air. Lung bases clear. Aortic Atherosclerosis (ICD10-I70.0). Electronically Signed   By: Lowella Grip III M.D.   On: 02/05/2021  09:00   DG Abd 1 View  Result Date: 02/01/2021 CLINICAL DATA:  Abdominal pain and distension EXAM: ABDOMEN - 1 VIEW COMPARISON:  CT abdomen 05/14/2018 FINDINGS: Small bowel pattern is unremarkable. Large amount of gas and stool throughout  the colon. Peripheral gas pattern in the rectum presumed to be peripheral gas around impacted stool. The unlikely possibility pneumatosis is not excluded. Arterial vascular calcification is noted throughout. Clips in the right upper quadrant consistent with previous cholecystectomy. Scoliosis and degenerative changes of the spine. IMPRESSION: Large amount of gas and stool throughout the colon. Peripheral gas pattern in the rectum presumed to be peripheral gas around impacted stool. The unlikely possibility of pneumatosis is not excluded. If there is clinical concern regarding the possibility bowel infarction, consider CT of the abdomen pelvis. Otherwise, the findings could simply be due to constipation. Electronically Signed   By: Nelson Chimes M.D.   On: 02/01/2021 19:26   CT HEAD WO CONTRAST  Result Date: 02/01/2021 CLINICAL DATA:  Delirium. EXAM: CT HEAD WITHOUT CONTRAST TECHNIQUE: Contiguous axial images were obtained from the base of the skull through the vertex without intravenous contrast. COMPARISON:  Noncontrast head CT 01/27/2021. Brain MRI 01/27/2021. CT angiogram head/neck 01/28/2021. FINDINGS: Brain: Moderate cerebral and cerebellar atrophy. Known early subacute infarct within the left corona radiata/basal ganglia. Background moderate multifocal T2/FLAIR hyperintensity within the cerebral white matter is nonspecific compatible with chronic small vessel ischemic disease. Redemonstrated small early subacute infarct within the left cerebellar hemisphere. Superimposed small chronic infarcts within the bilateral cerebellar hemispheres. There is no acute intracranial hemorrhage. No extra-axial fluid collection. No evidence of intracranial mass. No midline shift.  Vascular: No hyperdense vessel.  Atherosclerotic calcifications. Skull: Normal. Negative for fracture or focal lesion. Sinuses/Orbits: Visualized orbits show no acute finding. Trace ethmoid sinus mucosal thickening. IMPRESSION: No evidence of interval acute intracranial abnormality. Known subacute infarcts within the left corona radiata/basal ganglia and left cerebellar hemisphere. Stable background moderate generalized atrophy of the brain and chronic small vessel ischemic disease. Unchanged small remote infarcts within the bilateral cerebellar hemispheres. Electronically Signed   By: Kellie Simmering DO   On: 02/01/2021 14:59   CT HEAD WO CONTRAST  Result Date: 01/27/2021 CLINICAL DATA:  Right-sided weakness with slurred speech EXAM: CT HEAD WITHOUT CONTRAST TECHNIQUE: Contiguous axial images were obtained from the base of the skull through the vertex without intravenous contrast. COMPARISON:  02/19/2017 FINDINGS: Brain: No evidence of acute gray matter infarction, hemorrhage, hydrocephalus, extra-axial collection or mass lesion/mass effect. Confluent chronic small vessel ischemia in the deep cerebral white matter. Increased number of chronic appearing bilateral cerebellar infarcts. Age congruent brain volume Vascular: No hyperdense vessel or unexpected calcification. Skull: Normal. Negative for fracture or focal lesion. Sinuses/Orbits: Bilateral cataract resection. IMPRESSION: 1. No acute finding. 2. Extensive chronic small vessel disease. Electronically Signed   By: Monte Fantasia M.D.   On: 01/27/2021 07:23   CT ANGIO NECK W OR WO CONTRAST  Result Date: 01/28/2021 CLINICAL DATA:  Stroke. TIA. Left cerebellar and basal ganglia strokes. EXAM: CT ANGIOGRAPHY HEAD AND NECK TECHNIQUE: Multidetector CT imaging of the head and neck was performed using the standard protocol during bolus administration of intravenous contrast. Multiplanar CT image reconstructions and MIPs were obtained to evaluate the vascular  anatomy. Carotid stenosis measurements (when applicable) are obtained utilizing NASCET criteria, using the distal internal carotid diameter as the denominator. CONTRAST:  68mL OMNIPAQUE IOHEXOL 350 MG/ML SOLN COMPARISON:  CT and MRI studies done yesterday. FINDINGS: CT HEAD FINDINGS Brain: Focal low-density now evident in the left cerebellum. No evidence of hemorrhagic transformation. Cerebral hemispheres show a background pattern of extensive chronic small vessel disease of the white matter. The acute infarction in the left basal ganglia and radiating white matter  tracts is barely discernible. No hemorrhagic transformation. No new insult. No hydrocephalus or extra-axial collection. Vascular: There is atherosclerotic calcification of the major vessels at the base of the brain. Skull: Negative Sinuses: Clear Orbits: Normal Review of the MIP images confirms the above findings CTA NECK FINDINGS Aortic arch: Aortic atherosclerotic calcification. Branching pattern is normal without flow limiting stenosis. Right carotid system: Common carotid artery shows atherosclerotic plaque but without flow limiting stenosis proximal to the bifurcation. Dense calcified plaque at the carotid bifurcation and ICA bulb. Minimal diameter in the ICA bulb is 1.5 mm. Compared to a more distal cervical ICA of 5 mm, this indicates a 70% stenosis. Left carotid system: Common carotid artery shows scattered plaque but is sufficiently patent to the bifurcation region. Dense calcified plaque at the carotid bifurcation and ICA bulb. Minimal diameter in the ICA bulb is 2.5 mm. Compared to a more distal cervical ICA diameter of 5 mm, this indicates a 50% stenosis. Vertebral arteries: Both subclavian arteries show atherosclerotic disease but without a flow limiting stenosis. Both vertebral arteries are patent with stenosis of 50% at their origins. Beyond that, the vessels are patent through the cervical region to the foramen magnum. Skeleton: Ordinary  cervical spondylosis. Chronic arthropathy at the C1-2 articulation. Other neck: No mass or lymphadenopathy in the neck. Upper chest: Mild scarring at the lung apices.  No active process. Review of the MIP images confirms the above findings CTA HEAD FINDINGS Anterior circulation: Both internal carotid arteries are patent through the skull base and siphon regions. There is calcification in the carotid siphon regions with stenosis estimated at 30% on both sides. The anterior and middle cerebral vessels are patent without proximal stenosis, aneurysm or vascular malformation. No large or medium vessel occlusion identified. Posterior circulation: Both vertebral arteries are patent through the foramen magnum to the basilar. Focal plaque in the left vertebral V4 segment with stenosis of 30%. No basilar stenosis. Major posterior circulation branch vessels are patent. Left PCA receives its supply from the anterior circulation. Venous sinuses: Patent and normal. Anatomic variants: None Review of the MIP images confirms the above findings IMPRESSION: 1. Dense calcified plaque at both carotid bifurcations and ICA bulb regions. 70% stenosis of the ICA bulb on the right. 50% stenosis of the ICA bulb on the left. 2. 50% stenoses at both vertebral artery origins. 30% stenosis of the left vertebral V4 segment. 3. No intracranial large or medium vessel occlusion or correctable proximal stenosis. Aortic Atherosclerosis (ICD10-I70.0). Electronically Signed   By: Nelson Chimes M.D.   On: 01/28/2021 19:56   MR BRAIN WO CONTRAST  Result Date: 02/03/2021 CLINICAL DATA:  Delirium.  Recent stroke. EXAM: MRI HEAD WITHOUT CONTRAST TECHNIQUE: Multiplanar, multiecho pulse sequences of the brain and surrounding structures were obtained without intravenous contrast. COMPARISON:  Head CT 02/01/2021 and MRI 01/27/2021 FINDINGS: Brain: Recent infarcts are again noted in the left corona radiata and left cerebellum. The left corona radiata infarct  has slightly enlarged from the prior MRI while the cerebellar infarct is unchanged. No new infarct is identified elsewhere. Background patchy to confluent T2 hyperintensities in the cerebral white matter bilaterally are unchanged and nonspecific but compatible with moderate chronic small-vessel ischemic disease. Chronic bilateral cerebellar infarcts are again noted. There is moderate cerebral atrophy. Vascular: Major intracranial vascular flow voids are preserved. Skull and upper cervical spine: Unremarkable bone marrow signal. Sinuses/Orbits: Bilateral cataract extraction. Paranasal sinuses and mastoid air cells are clear. Other: None. IMPRESSION: 1. Slight enlargement of the left  corona radiata infarct since the 01/27/2021 MRI. 2. Unchanged subacute left cerebellar infarct. 3. Moderate chronic small-vessel ischemic disease. Electronically Signed   By: Logan Bores M.D.   On: 02/03/2021 13:04   MR BRAIN WO CONTRAST  Addendum Date: 01/27/2021   ADDENDUM REPORT: 01/27/2021 12:00 ADDENDUM: These results were called by telephone at the time of interpretation on 01/27/2021 at 12:00 pm to provider JOSHUA ZAVITZ , who verbally acknowledged these results. Electronically Signed   By: Pedro Earls M.D.   On: 01/27/2021 12:00   Result Date: 01/27/2021 CLINICAL DATA:  Stroke suspected. EXAM: MRI HEAD WITHOUT CONTRAST TECHNIQUE: Multiplanar, multiecho pulse sequences of the brain and surrounding structures were obtained without intravenous contrast. COMPARISON:  Head CT January 27, 2021. FINDINGS: Brain: Areas of restricted diffusion within the left cerebellar hemisphere and left caudate body extending into the lenticulocapsular region, consistent with acute infarct. No hemorrhage, hydrocephalus, extra-axial collection or mass lesion. Scattered and confluent foci of T2 hyperintensity are seen within the white matter of the cerebral hemispheres, nonspecific, most likely related to chronic microvascular  ischemic changes. Remote lacunar infarcts in the bilateral cerebellar hemispheres. Vascular: Normal flow voids. Skull and upper cervical spine: Normal marrow signal. Sinuses/Orbits: Bilateral lens surgery. Paranasal sinuses are clear. IMPRESSION: 1. Areas of restricted diffusion within the left cerebellar hemisphere and left caudate body extending into the lenticulocapsular region, consistent with acute infarcts. 2. Remote lacunar infarcts in the bilateral cerebellar hemispheres. 3. Moderate chronic microvascular ischemic changes. Electronically Signed: By: Pedro Earls M.D. On: 01/27/2021 11:54   ECHOCARDIOGRAM COMPLETE  Result Date: 01/29/2021    ECHOCARDIOGRAM REPORT   Patient Name:   JANAH MCCULLOH Date of Exam: 01/29/2021 Medical Rec #:  [id number]           Height:       62.0 in Accession #:    [id number]          Weight:       141.8 lb Date of Birth:  02/17/1928            BSA:          1.652 m Patient Age:    6 years            BP:           161/92 mmHg Patient Gender: F                   HR:           116 bpm. Exam Location:  Inpatient Procedure: 2D Echo Indications:    Stroke 434.91 / I163.9  History:        Patient has prior history of Echocardiogram examinations, most                 recent 01/17/2012. CAD, Prior CABG, Stroke, Arrythmias:Atrial                 Fibrillation; Risk Factors:Dyslipidemia, Former Smoker and                 Hypertension. Sepsis, 19 mm Magna Ease pericardial tissue valve                 12/28/2011.                 Aortic Valve: 19 mm Edwards MagnaEase valve is present in the                 aortic position.  Sonographer:    Leavy Cella Referring Phys: [place] Comments: Restricted Mobility. IMPRESSIONS  1. Left ventricular ejection fraction, by estimation, is 60 to 65%. The left ventricle has normal function. The left ventricle has no regional wall motion abnormalities. Left ventricular diastolic function could not be evaluated.   2. Right ventricular systolic function is normal. The right ventricular size is normal. There is normal pulmonary artery systolic pressure.  3. The mitral valve is abnormal. Trivial mitral valve regurgitation. Moderate mitral annular calcification.  4. The aortic valve has been repaired/replaced. Aortic valve regurgitation is trivial. There is a 19 mm Edwards MagnaEase valve present in the aortic position. Echo findings are consistent with normal structure and function of the aortic valve prosthesis. Aortic valve mean gradient measures 14.3 mmHg. Aortic valve Vmax measures 2.44 m/s.  5. The inferior vena cava is normal in size with greater than 50% respiratory variability, suggesting right atrial pressure of 3 mmHg. Comparison(s): Prior images unable to be directly viewed, comparison made by report only. Changes from prior study are noted. 01/17/12: LVEF 55-60%, aortic bioprosthesis -mean gradient 22 mmHg. FINDINGS  Left Ventricle: Left ventricular ejection fraction, by estimation, is 60 to 65%. The left ventricle has normal function. The left ventricle has no regional wall motion abnormalities. The left ventricular internal cavity size was normal in size. There is  no left ventricular hypertrophy. Left ventricular diastolic function could not be evaluated due to atrial fibrillation. Left ventricular diastolic function could not be evaluated. Right Ventricle: The right ventricular size is normal. No increase in right ventricular wall thickness. Right ventricular systolic function is normal. There is normal pulmonary artery systolic pressure. The tricuspid regurgitant velocity is 2.43 m/s, and  with an assumed right atrial pressure of 3 mmHg, the estimated right ventricular systolic pressure is 38.7 mmHg. Left Atrium: Left atrial size was normal in size. Right Atrium: Right atrial size was normal in size. Pericardium: There is no evidence of pericardial effusion. Mitral Valve: The mitral valve is abnormal. There is  mild thickening of the mitral valve leaflet(s). Moderate mitral annular calcification. Trivial mitral valve regurgitation. Tricuspid Valve: The tricuspid valve is grossly normal. Tricuspid valve regurgitation is trivial. Aortic Valve: The aortic valve has been repaired/replaced. Aortic valve regurgitation is trivial. Aortic valve mean gradient measures 14.3 mmHg. Aortic valve peak gradient measures 23.9 mmHg. Aortic valve area, by VTI measures 0.53 cm. There is a 19 mm Edwards MagnaEase valve present in the aortic position. Echo findings are consistent with normal structure and function of the aortic valve prosthesis. Pulmonic Valve: The pulmonic valve was normal in structure. Pulmonic valve regurgitation is trivial. Aorta: The aortic root and ascending aorta are structurally normal, with no evidence of dilitation. Venous: The inferior vena cava is normal in size with greater than 50% respiratory variability, suggesting right atrial pressure of 3 mmHg. IAS/Shunts: No atrial level shunt detected by color flow Doppler.  LEFT VENTRICLE PLAX 2D LVOT diam:     1.90 cm  Diastology LV SV:         23       LV e' medial:   3.70 cm/s LV SV Index:   14       LV E/e' medial: 34.3 LVOT Area:     2.84 cm  RIGHT VENTRICLE RV S prime:     14.20 cm/s TAPSE (M-mode): 1.5 cm LEFT ATRIUM             Index  RIGHT ATRIUM           Index LA diam:        3.60 cm 2.18 cm/m  RA Area:     16.20 cm LA Vol (A2C):   49.1 ml 29.73 ml/m RA Volume:   46.60 ml  28.22 ml/m LA Vol (A4C):   47.9 ml 29.00 ml/m LA Biplane Vol: 53.2 ml 32.21 ml/m  AORTIC VALVE AV Area (Vmax):    0.59 cm AV Area (Vmean):   0.57 cm AV Area (VTI):     0.53 cm AV Vmax:           244.33 cm/s AV Vmean:          174.000 cm/s AV VTI:            0.432 m AV Peak Grad:      23.9 mmHg AV Mean Grad:      14.3 mmHg LVOT Vmax:         51.20 cm/s LVOT Vmean:        35.267 cm/s LVOT VTI:          0.080 m LVOT/AV VTI ratio: 0.19  AORTA Ao Root diam: 2.40 cm MITRAL VALVE                 TRICUSPID VALVE MV Area (PHT): 6.71 cm     TR Peak grad:   23.6 mmHg MV Decel Time: 113 msec     TR Vmax:        243.00 cm/s MV E velocity: 127.00 cm/s MV A velocity: 58.10 cm/s   SHUNTS MV E/A ratio:  2.19         Systemic VTI:  0.08 m                             Systemic Diam: 1.90 cm Lyman Bishop MD Electronically signed by Lyman Bishop MD Signature Date/Time: 01/29/2021/12:51:58 PM    Final    VAS US CAROTID (at [facility] and [facility] only)  Result Date: 01/30/2021 Carotid Arterial Duplex Study Indications:       CVA. Risk Factors:      Hypertension, hyperlipidemia, coronary artery disease. Comparison Study:  01-22-2017 Prior carotid artery duplex study showed 1-39%                    stenosis bilaterally. Performing Technologist: Darlin Coco RDMS  Examination Guidelines: A complete evaluation includes B-mode imaging, spectral Doppler, color Doppler, and power Doppler as needed of all accessible portions of each vessel. Bilateral testing is considered an integral part of a complete examination. Limited examinations for reoccurring indications may be performed as noted.  Right Carotid Findings: +----------+--------+--------+--------+------------------+---------------------+           PSV cm/sEDV cm/sStenosisPlaque DescriptionComments              +----------+--------+--------+--------+------------------+---------------------+ CCA Prox  89      16                                                      +----------+--------+--------+--------+------------------+---------------------+ CCA Distal69      16              calcific                                +----------+--------+--------+--------+------------------+---------------------+  ICA Prox  131     31      1-39%   calcific          Velocities may                                                            underestimate degree                                                      of stenosis due to                                                         more proximal                                                             obstruction.          +----------+--------+--------+--------+------------------+---------------------+ ICA Mid   133     25                                                      +----------+--------+--------+--------+------------------+---------------------+ ICA Distal104     23                                                      +----------+--------+--------+--------+------------------+---------------------+ ECA       246             >50%    calcific                                +----------+--------+--------+--------+------------------+---------------------+ +----------+--------+-------+----------------+-------------------+           PSV cm/sEDV cmsDescribe        Arm Pressure (mmHG) +----------+--------+-------+----------------+-------------------+ [id number]            Multiphasic, WNL                    +----------+--------+-------+----------------+-------------------+ +---------+--------+--+--------+--+---------+ VertebralPSV cm/s44EDV cm/s12Antegrade +---------+--------+--+--------+--+---------+  Left Carotid Findings: +----------+--------+--------+--------+------------------+--------+           PSV cm/sEDV cm/sStenosisPlaque DescriptionComments +----------+--------+--------+--------+------------------+--------+ CCA Prox  101     18                                         +----------+--------+--------+--------+------------------+--------+ CCA Distal74      13  calcific                   +----------+--------+--------+--------+------------------+--------+ ICA Prox  140     18      1-39%   calcific                   +----------+--------+--------+--------+------------------+--------+ ICA Mid   126     23                                          +----------+--------+--------+--------+------------------+--------+ ICA Distal104     20                                         +----------+--------+--------+--------+------------------+--------+ ECA       260             >50%    calcific                   +----------+--------+--------+--------+------------------+--------+ +----------+--------+--------+----------------+-------------------+           PSV cm/sEDV cm/sDescribe        Arm Pressure (mmHG) +----------+--------+--------+----------------+-------------------+ [id number]             Multiphasic, WNL                    +----------+--------+--------+----------------+-------------------+ +---------+--------+--+--------+--+---------+ VertebralPSV cm/s97EDV cm/s15Antegrade +---------+--------+--+--------+--+---------+   Summary: Right Carotid: Velocities in the right ICA are consistent with a 1-39% stenosis.                The ECA appears >50% stenosed. Left Carotid: Velocities in the left ICA are consistent with a 1-39% stenosis.               The ECA appears >50% stenosed. Vertebrals:  Bilateral vertebral arteries demonstrate antegrade flow. Subclavians: Normal flow hemodynamics were seen in bilateral subclavian              arteries. *See table(s) above for measurements and observations.  Electronically signed by Antony Contras MD on 01/30/2021 at 8:49:27 AM.    Final     Assessment/Plan   Autoimmune hemolytic anemia ([place]) Possible reason for Anemia Will also test Stool for blood D/W family and Also Dr Benay Spice.  She is Weak but no SOB or Chest Pain Increased Prednisone to 60 mg Will try to see if we can get her in their office tomorrow for Transfusion and further Blood testing  Acute CVA (cerebrovascular accident) (HCC)MRI  Left Cerebral Infarct Ambolic On Eliquis and statin Doing better with therapy. Lethargy improved also Still needs help with her ADLS On Statin Echo in the hospital was negative for any  Emboli Plan for follow up with Neurology   Persistent atrial fibrillation (HCC) On Eliquis and Metoprolol Dose was reduced for Metoprolol due to Low BP  Hyponatremia On Fluid restriction Sodium stable  Hyperlipidemia, unspecified hyperlipidemia type LDL 48 Multiple Skin tears' D/w Wound care Nurse Hypothyroid TSH normal levels     Labs/tests ordered:  * No order type specified * Next appt:  Visit date not found Total time spent in this patient care encounter was 45 _  minutes; greater than 50% of the visit spent counseling patient and staff, reviewing records , Labs and coordinating care for problems addressed at this encounter.

## 2021-02-24 NOTE — Telephone Encounter (Signed)
Daughter([name]) wanted to speak to MD about her mother's lab results. Nurse is aware and is awaiting lab results.

## 2021-02-24 NOTE — Telephone Encounter (Signed)
Call from SNF stating Dr. Lyndel Safe spoke w/Dr. Benay Spice last night and she is en route to [facility] for labs and transfusion. Informed him we are not aware of any orders for this. Told RN we can draw labs, but most likely will not have opening to transfuse her today and she does not have transportation or is Saturday appropriate due to total care and paralysis--uses lift. Once labs are processed, can see if a cancellation has occurred to allow transfusion today.

## 2021-02-24 NOTE — Progress Notes (Addendum)
[facility] [place] OFFICE PROGRESS NOTE   Diagnosis: Autoimmune hemolytic anemia  INTERVAL HISTORY:   Carol [name] is seen today in an unscheduled visit due to recent finding of progressive anemia.  She was hospitalized 01/27/2021 to 2/142022 with acute nonhemorrhagic CVA and metabolic encephalopathy.  Stroke thought to be embolic from atrial fibrillation.  Eliquis was increased to 5 mg twice daily.  She had lab work done at the nursing facility yesterday with hemoglobin returning at 7.3.  Dr. Lyndel Safe contacted Dr. Benay Spice yesterday and she was started on prednisone 60 mg daily.  She is accompanied by her daughter who reports Mrs. Intake has been weak, lethargic recently.  No bleeding that the daughter is aware of.  Some confusion at times.  Right side weakness.  She does not ambulate independently.  Daughter reports she is working with physical therapy with 2 people assisting.  No fever, cough, shortness of breath.  Objective:  Vital signs in last 24 hours:  Blood pressure 114/77, pulse 72, temperature 98.1 F (36.7 C), temperature source Tympanic, resp. rate 16, height 5\' 4"  (1.626 m), SpO2 98 %.    HEENT: No thrush or ulcers. Resp: Lungs clear, poor effort.  No respiratory distress. Cardio: Distant heart sounds.  Irregular. GI: No hepatosplenomegaly. Vascular: No leg edema. Neuro: Seems sleepy.  Arouses to verbal stimuli.  Follows commands.  Seems mildly confused. Skin: Bandage at the right lower outer leg.   Lab Results:  Lab Results  Component Value Date   WBC 10.3 02/24/2021   HGB 8.0 (L) 02/24/2021   HCT 25.9 (L) 02/24/2021   MCV 90.6 02/24/2021   PLT 279 02/24/2021   NEUTROABS 8.3 (H) 02/24/2021    Imaging:  No results found.  Medications: I have reviewed the patient's current medications.  Assessment/Plan: 1. Autoimmune hemolytic anemia, status post 2 units of packed red blood cells 05/14/2018, prednisone 05/15/2018, Solu-Medrol started  05/16/2018;prednisone 60 mg daily beginning 05/19/2018  Prednisone taper to 40 mg daily beginning 05/23/2018  Prednisone taper to 30 mg daily beginning 05/30/2018  Prednisone taper to 20 mg daily beginning 06/13/2018  Prednisone taper to 15 mg daily beginning 06/28/2018  Prednisone taper to 10 mg daily beginning 07/23/2018  Prednisone taper to 5 mg daily beginning 09/01/2018  Prednisone dose increased 09/16/2018 due to a rash,20 mg for 2 weeks followed by 10 mg per dermatology  Prednisone decreased to 5 mg daily beginning 10/14/2018  Prednisone decreased to 2.5 mg daily approximately 11/18/2018  Prednisone decreased to 2.5 mg every other day 12/09/2018  Prednisone increased to 2.5 mg daily 12/15/2018 due to recurrent rash  Prednisone changed to 1 mg every other day  Recurrent hemolysis 07/13/2019, prednisone 40 mg daily  Prednisone taper to 30 mg daily beginning 07/24/2019  Prednisone taper to 20 mg daily beginning 08/07/2019  Prednisone taper to 15 mg daily beginning 09/01/2019  Prednisone taper to 10 mg daily beginning 10/02/2019  Prednisone taper to 5 mg daily beginning 11/02/2019  Prednisone 5 mg daily continued  Prednisone increased to 10 mg daily 12/12/2020 secondary to progressive skin rash  Progressive anemia 02/23/2021, prednisone 60 mg daily  Labs 02/24/2021 consistent with hemolysis  Transfuse 1 unit of blood 02/24/2021  2. History of coronary artery disease  3. Status post aortic valve replacement, maintained on Coumadin  4. Hyperlipidemia  5. Hypothyroidism  6.Hospitalization 08/17/2018 through 08/20/2018 with E. coli UTI  7.   Recurrent rash 11/28/2020 -much improved following initiation of Claritin and an increase in the prednisone to  10 mg daily beginning 12/12/2020  8.   Stroke in February 2022, felt to be embolic related atrial fibrillation, Eliquis increased to 5 mg twice daily    Disposition: Carol [name] appears to have recurrent hemolysis.   She is symptomatic from the anemia.  We are making arrangements to transfuse 1 unit of blood today.  She should continue prednisone 60 mg daily for 5 days then decrease to 40 mg daily; repeat CBC and reticulocyte count in 1 week with further instructions based on those results; follow-up at the [facility] in 3 weeks.  Patient seen with Dr. Benay Spice.    [name] ANP/GNP-BC   02/24/2021  11:45 AM  This was a shared visit with [name].  Ms. Farrier was interviewed and examined.  She was admitted last month with an acute CVA.  She has persistent right-sided weakness and altered mental status.  I was contacted by Dr. Lyndel Safe yesterday with a report of a low hemoglobin at the skilled nursing facility.  The hemoglobin remains low today.  She is mounting a reticulocytosis and the LDH is elevated.  These findings are consistent with recurrent hemolysis.  She started higher dose prednisone yesterday.  I recommend continuing prednisone at a dose of 60 mg daily for 5 days and then tapered to 40 mg daily.  We will arrange for transfusion with 1 unit of packed red blood cells.  I recommend a weekly CBC be obtained via Dr. Lyndel Safe.  Julieanne Manson, MD

## 2021-02-27 ENCOUNTER — Telehealth: Payer: Self-pay | Admitting: Nurse Practitioner

## 2021-02-27 ENCOUNTER — Other Ambulatory Visit: Payer: Self-pay

## 2021-02-27 ENCOUNTER — Ambulatory Visit: Payer: Medicare Other | Admitting: Nurse Practitioner

## 2021-02-27 ENCOUNTER — Inpatient Hospital Stay: Payer: Medicare Other

## 2021-02-27 ENCOUNTER — Other Ambulatory Visit: Payer: Medicare Other

## 2021-02-27 DIAGNOSIS — D591 Autoimmune hemolytic anemia, unspecified: Secondary | ICD-10-CM

## 2021-02-27 DIAGNOSIS — A4102 Sepsis due to Methicillin resistant Staphylococcus aureus: Secondary | ICD-10-CM | POA: Diagnosis not present

## 2021-02-27 DIAGNOSIS — Z66 Do not resuscitate: Secondary | ICD-10-CM | POA: Diagnosis not present

## 2021-02-27 DIAGNOSIS — I5032 Chronic diastolic (congestive) heart failure: Secondary | ICD-10-CM | POA: Diagnosis not present

## 2021-02-27 DIAGNOSIS — K921 Melena: Secondary | ICD-10-CM | POA: Diagnosis not present

## 2021-02-27 DIAGNOSIS — R911 Solitary pulmonary nodule: Secondary | ICD-10-CM | POA: Diagnosis not present

## 2021-02-27 DIAGNOSIS — A419 Sepsis, unspecified organism: Secondary | ICD-10-CM | POA: Diagnosis not present

## 2021-02-27 DIAGNOSIS — Z20822 Contact with and (suspected) exposure to covid-19: Secondary | ICD-10-CM | POA: Diagnosis not present

## 2021-02-27 DIAGNOSIS — R7881 Bacteremia: Secondary | ICD-10-CM | POA: Diagnosis not present

## 2021-02-27 DIAGNOSIS — R509 Fever, unspecified: Secondary | ICD-10-CM | POA: Diagnosis not present

## 2021-02-27 LAB — RETICULOCYTES
Immature Retic Fract: 28.6 % — ABNORMAL HIGH (ref 2.3–15.9)
RBC.: 2.65 MIL/uL — ABNORMAL LOW (ref 3.87–5.11)
Retic Count, Absolute: 237 10*3/uL — ABNORMAL HIGH (ref 19.0–186.0)
Retic Ct Pct: 9 % — ABNORMAL HIGH (ref 0.4–3.1)

## 2021-02-27 LAB — TYPE AND SCREEN
ABO/RH(D): O POS
Antibody Screen: NEGATIVE
Unit division: 0

## 2021-02-27 LAB — CBC WITH DIFFERENTIAL (CANCER CENTER ONLY)
Abs Immature Granulocytes: 0.06 10*3/uL (ref 0.00–0.07)
Basophils Absolute: 0 10*3/uL (ref 0.0–0.1)
Basophils Relative: 0 %
Eosinophils Absolute: 0 10*3/uL (ref 0.0–0.5)
Eosinophils Relative: 0 %
HCT: 25.4 % — ABNORMAL LOW (ref 36.0–46.0)
Hemoglobin: 7.7 g/dL — ABNORMAL LOW (ref 12.0–15.0)
Immature Granulocytes: 1 %
Lymphocytes Relative: 13 %
Lymphs Abs: 1.6 10*3/uL (ref 0.7–4.0)
MCH: 27.2 pg (ref 26.0–34.0)
MCHC: 30.3 g/dL (ref 30.0–36.0)
MCV: 89.8 fL (ref 80.0–100.0)
Monocytes Absolute: 0.4 10*3/uL (ref 0.1–1.0)
Monocytes Relative: 3 %
Neutro Abs: 10.6 10*3/uL — ABNORMAL HIGH (ref 1.7–7.7)
Neutrophils Relative %: 83 %
Platelet Count: 267 10*3/uL (ref 150–400)
RBC: 2.83 MIL/uL — ABNORMAL LOW (ref 3.87–5.11)
RDW: 18 % — ABNORMAL HIGH (ref 11.5–15.5)
WBC Count: 12.6 10*3/uL — ABNORMAL HIGH (ref 4.0–10.5)
nRBC: 0 % (ref 0.0–0.2)

## 2021-02-27 LAB — CMP (CANCER CENTER ONLY)
ALT: 20 U/L (ref 0–44)
AST: 49 U/L — ABNORMAL HIGH (ref 15–41)
Albumin: 3.1 g/dL — ABNORMAL LOW (ref 3.5–5.0)
Alkaline Phosphatase: 66 U/L (ref 38–126)
Anion gap: 7 (ref 5–15)
BUN: 31 mg/dL — ABNORMAL HIGH (ref 8–23)
CO2: 24 mmol/L (ref 22–32)
Calcium: 9 mg/dL (ref 8.9–10.3)
Chloride: 101 mmol/L (ref 98–111)
Creatinine: 1.15 mg/dL — ABNORMAL HIGH (ref 0.44–1.00)
GFR, Estimated: 45 mL/min — ABNORMAL LOW (ref >60)
Glucose, Bld: 189 mg/dL — ABNORMAL HIGH (ref 70–99)
Potassium: 4.8 mmol/L (ref 3.5–5.1)
Sodium: 132 mmol/L — ABNORMAL LOW (ref 135–145)
Total Bilirubin: 1 mg/dL (ref 0.3–1.2)
Total Protein: 6.1 g/dL — ABNORMAL LOW (ref 6.5–8.1)

## 2021-02-27 LAB — BPAM RBC
Blood Product Expiration Date: 202203282359
Unit Type and Rh: 9500

## 2021-02-27 LAB — SAMPLE TO BLOOD BANK

## 2021-02-27 LAB — LACTATE DEHYDROGENASE: LDH: 306 U/L — ABNORMAL HIGH (ref 98–192)

## 2021-02-27 MED ORDER — ACETAMINOPHEN 325 MG PO TABS
650.0000 mg | ORAL_TABLET | Freq: Once | ORAL | Status: AC
  Administered 2021-02-27: 650 mg via ORAL

## 2021-02-27 MED ORDER — ACETAMINOPHEN 325 MG PO TABS
ORAL_TABLET | ORAL | Status: AC
  Filled 2021-02-27: qty 2

## 2021-02-27 MED ORDER — SODIUM CHLORIDE 0.9% IV SOLUTION
250.0000 mL | Freq: Once | INTRAVENOUS | Status: AC
  Administered 2021-02-27: 250 mL via INTRAVENOUS
  Filled 2021-02-27: qty 250

## 2021-02-27 NOTE — Telephone Encounter (Signed)
Scheduled appointments per 3/4 los. Spoke to patient's care giver who is aware of appointments date and times.

## 2021-02-27 NOTE — Patient Instructions (Signed)

## 2021-02-28 LAB — TYPE AND SCREEN
ABO/RH(D): O POS
Antibody Screen: NEGATIVE
Unit division: 0

## 2021-02-28 LAB — BPAM RBC
Blood Product Expiration Date: 202203282359
ISSUE DATE / TIME: 202203071553
Unit Type and Rh: 9500

## 2021-03-01 ENCOUNTER — Telehealth: Payer: Self-pay | Admitting: *Deleted

## 2021-03-01 ENCOUNTER — Ambulatory Visit: Payer: Medicare Other | Admitting: Nurse Practitioner

## 2021-03-01 ENCOUNTER — Other Ambulatory Visit: Payer: Medicare Other

## 2021-03-01 NOTE — Telephone Encounter (Signed)
Return call from caregiver w/phone and fax # for [facility]

## 2021-03-01 NOTE — Telephone Encounter (Signed)
Caregiver has called to ask if her labs on 03/17/21 could be drawn a couple days prior to office visit at [facility] so she won't have to get here so early that day? She will obtain phone # to facility so orders can be faxed.

## 2021-03-02 ENCOUNTER — Inpatient Hospital Stay (HOSPITAL_COMMUNITY)
Admission: EM | Admit: 2021-03-02 | Discharge: 2021-03-24 | DRG: 872 | Disposition: E | Payer: Medicare Other | Source: Skilled Nursing Facility | Attending: Family Medicine | Admitting: Family Medicine

## 2021-03-02 ENCOUNTER — Encounter: Payer: Self-pay | Admitting: Nurse Practitioner

## 2021-03-02 ENCOUNTER — Other Ambulatory Visit: Payer: Self-pay

## 2021-03-02 ENCOUNTER — Emergency Department (HOSPITAL_COMMUNITY): Payer: Medicare Other

## 2021-03-02 ENCOUNTER — Inpatient Hospital Stay (HOSPITAL_COMMUNITY): Payer: Medicare Other

## 2021-03-02 ENCOUNTER — Non-Acute Institutional Stay (SKILLED_NURSING_FACILITY): Payer: Medicare Other | Admitting: Nurse Practitioner

## 2021-03-02 ENCOUNTER — Encounter (HOSPITAL_COMMUNITY): Payer: Self-pay | Admitting: Family Medicine

## 2021-03-02 DIAGNOSIS — Z888 Allergy status to other drugs, medicaments and biological substances status: Secondary | ICD-10-CM

## 2021-03-02 DIAGNOSIS — E039 Hypothyroidism, unspecified: Secondary | ICD-10-CM

## 2021-03-02 DIAGNOSIS — R5383 Other fatigue: Secondary | ICD-10-CM | POA: Diagnosis not present

## 2021-03-02 DIAGNOSIS — I2510 Atherosclerotic heart disease of native coronary artery without angina pectoris: Secondary | ICD-10-CM | POA: Diagnosis present

## 2021-03-02 DIAGNOSIS — I951 Orthostatic hypotension: Secondary | ICD-10-CM | POA: Diagnosis not present

## 2021-03-02 DIAGNOSIS — I639 Cerebral infarction, unspecified: Secondary | ICD-10-CM

## 2021-03-02 DIAGNOSIS — R0902 Hypoxemia: Secondary | ICD-10-CM

## 2021-03-02 DIAGNOSIS — R7881 Bacteremia: Secondary | ICD-10-CM | POA: Diagnosis not present

## 2021-03-02 DIAGNOSIS — Z951 Presence of aortocoronary bypass graft: Secondary | ICD-10-CM

## 2021-03-02 DIAGNOSIS — Z807 Family history of other malignant neoplasms of lymphoid, hematopoietic and related tissues: Secondary | ICD-10-CM | POA: Diagnosis not present

## 2021-03-02 DIAGNOSIS — D591 Autoimmune hemolytic anemia, unspecified: Secondary | ICD-10-CM | POA: Diagnosis present

## 2021-03-02 DIAGNOSIS — A4102 Sepsis due to Methicillin resistant Staphylococcus aureus: Principal | ICD-10-CM | POA: Diagnosis present

## 2021-03-02 DIAGNOSIS — Z803 Family history of malignant neoplasm of breast: Secondary | ICD-10-CM

## 2021-03-02 DIAGNOSIS — Z8673 Personal history of transient ischemic attack (TIA), and cerebral infarction without residual deficits: Secondary | ICD-10-CM

## 2021-03-02 DIAGNOSIS — R000 Tachycardia, unspecified: Secondary | ICD-10-CM | POA: Diagnosis not present

## 2021-03-02 DIAGNOSIS — M5136 Other intervertebral disc degeneration, lumbar region: Secondary | ICD-10-CM

## 2021-03-02 DIAGNOSIS — R911 Solitary pulmonary nodule: Secondary | ICD-10-CM | POA: Diagnosis not present

## 2021-03-02 DIAGNOSIS — A419 Sepsis, unspecified organism: Secondary | ICD-10-CM | POA: Diagnosis not present

## 2021-03-02 DIAGNOSIS — I480 Paroxysmal atrial fibrillation: Secondary | ICD-10-CM | POA: Diagnosis present

## 2021-03-02 DIAGNOSIS — D72829 Elevated white blood cell count, unspecified: Secondary | ICD-10-CM | POA: Diagnosis present

## 2021-03-02 DIAGNOSIS — Z66 Do not resuscitate: Secondary | ICD-10-CM | POA: Diagnosis present

## 2021-03-02 DIAGNOSIS — Z79890 Hormone replacement therapy (postmenopausal): Secondary | ICD-10-CM

## 2021-03-02 DIAGNOSIS — K921 Melena: Secondary | ICD-10-CM | POA: Diagnosis present

## 2021-03-02 DIAGNOSIS — F99 Mental disorder, not otherwise specified: Secondary | ICD-10-CM | POA: Diagnosis not present

## 2021-03-02 DIAGNOSIS — I459 Conduction disorder, unspecified: Secondary | ICD-10-CM | POA: Diagnosis present

## 2021-03-02 DIAGNOSIS — Z886 Allergy status to analgesic agent status: Secondary | ICD-10-CM

## 2021-03-02 DIAGNOSIS — Z8249 Family history of ischemic heart disease and other diseases of the circulatory system: Secondary | ICD-10-CM

## 2021-03-02 DIAGNOSIS — E871 Hypo-osmolality and hyponatremia: Secondary | ICD-10-CM

## 2021-03-02 DIAGNOSIS — Z952 Presence of prosthetic heart valve: Secondary | ICD-10-CM | POA: Diagnosis not present

## 2021-03-02 DIAGNOSIS — N3281 Overactive bladder: Secondary | ICD-10-CM

## 2021-03-02 DIAGNOSIS — I5032 Chronic diastolic (congestive) heart failure: Secondary | ICD-10-CM | POA: Diagnosis present

## 2021-03-02 DIAGNOSIS — K5900 Constipation, unspecified: Secondary | ICD-10-CM

## 2021-03-02 DIAGNOSIS — Z20822 Contact with and (suspected) exposure to covid-19: Secondary | ICD-10-CM | POA: Diagnosis present

## 2021-03-02 DIAGNOSIS — E782 Mixed hyperlipidemia: Secondary | ICD-10-CM | POA: Diagnosis present

## 2021-03-02 DIAGNOSIS — I110 Hypertensive heart disease with heart failure: Secondary | ICD-10-CM | POA: Diagnosis present

## 2021-03-02 DIAGNOSIS — Z79899 Other long term (current) drug therapy: Secondary | ICD-10-CM | POA: Diagnosis not present

## 2021-03-02 DIAGNOSIS — Z7952 Long term (current) use of systemic steroids: Secondary | ICD-10-CM | POA: Diagnosis not present

## 2021-03-02 DIAGNOSIS — R0603 Acute respiratory distress: Secondary | ICD-10-CM | POA: Diagnosis present

## 2021-03-02 DIAGNOSIS — R404 Transient alteration of awareness: Secondary | ICD-10-CM | POA: Diagnosis not present

## 2021-03-02 DIAGNOSIS — Z91040 Latex allergy status: Secondary | ICD-10-CM

## 2021-03-02 DIAGNOSIS — I10 Essential (primary) hypertension: Secondary | ICD-10-CM | POA: Diagnosis present

## 2021-03-02 DIAGNOSIS — R0689 Other abnormalities of breathing: Secondary | ICD-10-CM | POA: Diagnosis not present

## 2021-03-02 DIAGNOSIS — R413 Other amnesia: Secondary | ICD-10-CM

## 2021-03-02 DIAGNOSIS — Z7901 Long term (current) use of anticoagulants: Secondary | ICD-10-CM

## 2021-03-02 DIAGNOSIS — Z87891 Personal history of nicotine dependence: Secondary | ICD-10-CM

## 2021-03-02 DIAGNOSIS — M51369 Other intervertebral disc degeneration, lumbar region without mention of lumbar back pain or lower extremity pain: Secondary | ICD-10-CM

## 2021-03-02 DIAGNOSIS — R509 Fever, unspecified: Secondary | ICD-10-CM | POA: Diagnosis not present

## 2021-03-02 DIAGNOSIS — I959 Hypotension, unspecified: Secondary | ICD-10-CM | POA: Diagnosis not present

## 2021-03-02 DIAGNOSIS — Z881 Allergy status to other antibiotic agents status: Secondary | ICD-10-CM

## 2021-03-02 LAB — LACTIC ACID, PLASMA
Lactic Acid, Venous: 10.4 mmol/L (ref 0.5–1.9)
Lactic Acid, Venous: >11 mmol/L (ref 0.5–1.9)

## 2021-03-02 LAB — CBC WITH DIFFERENTIAL/PLATELET
Abs Immature Granulocytes: 0 10*3/uL (ref 0.00–0.07)
Basophils Absolute: 0 10*3/uL (ref 0.0–0.1)
Basophils Relative: 0 %
Eosinophils Absolute: 0 10*3/uL (ref 0.0–0.5)
Eosinophils Relative: 0 %
HCT: 32.8 % — ABNORMAL LOW (ref 36.0–46.0)
Hemoglobin: 9.9 g/dL — ABNORMAL LOW (ref 12.0–15.0)
Lymphocytes Relative: 2 %
Lymphs Abs: 0.5 10*3/uL — ABNORMAL LOW (ref 0.7–4.0)
MCH: 28.4 pg (ref 26.0–34.0)
MCHC: 30.2 g/dL (ref 30.0–36.0)
MCV: 94.3 fL (ref 80.0–100.0)
Monocytes Absolute: 0.2 10*3/uL (ref 0.1–1.0)
Monocytes Relative: 1 %
Neutro Abs: 22.6 10*3/uL — ABNORMAL HIGH (ref 1.7–7.7)
Neutrophils Relative %: 97 %
Platelets: 127 10*3/uL — ABNORMAL LOW (ref 150–400)
RBC: 3.48 MIL/uL — ABNORMAL LOW (ref 3.87–5.11)
RDW: 19.3 % — ABNORMAL HIGH (ref 11.5–15.5)
WBC: 23.3 10*3/uL — ABNORMAL HIGH (ref 4.0–10.5)
nRBC: 0 /100 WBC
nRBC: 1.5 % — ABNORMAL HIGH (ref 0.0–0.2)

## 2021-03-02 LAB — COMPREHENSIVE METABOLIC PANEL
ALT: 36 U/L (ref 0–44)
AST: 72 U/L — ABNORMAL HIGH (ref 15–41)
Albumin: 2.4 g/dL — ABNORMAL LOW (ref 3.5–5.0)
Alkaline Phosphatase: 68 U/L (ref 38–126)
Anion gap: 13 (ref 5–15)
BUN: 45 mg/dL — ABNORMAL HIGH (ref 8–23)
CO2: 16 mmol/L — ABNORMAL LOW (ref 22–32)
Calcium: 8.4 mg/dL — ABNORMAL LOW (ref 8.9–10.3)
Chloride: 108 mmol/L (ref 98–111)
Creatinine, Ser: 1.61 mg/dL — ABNORMAL HIGH (ref 0.44–1.00)
GFR, Estimated: 30 mL/min — ABNORMAL LOW (ref >60)
Glucose, Bld: 108 mg/dL — ABNORMAL HIGH (ref 70–99)
Potassium: 4.2 mmol/L (ref 3.5–5.1)
Sodium: 137 mmol/L (ref 135–145)
Total Bilirubin: 0.9 mg/dL (ref 0.3–1.2)
Total Protein: 4.6 g/dL — ABNORMAL LOW (ref 6.5–8.1)

## 2021-03-02 LAB — APTT: aPTT: 38 seconds — ABNORMAL HIGH (ref 24–36)

## 2021-03-02 LAB — RESP PANEL BY RT-PCR (FLU A&B, COVID) ARPGX2
Influenza A by PCR: NEGATIVE
Influenza B by PCR: NEGATIVE
SARS Coronavirus 2 by RT PCR: NEGATIVE

## 2021-03-02 LAB — PROTIME-INR
INR: 3.3 — ABNORMAL HIGH (ref 0.8–1.2)
Prothrombin Time: 32.3 seconds — ABNORMAL HIGH (ref 11.4–15.2)

## 2021-03-02 MED ORDER — ATORVASTATIN CALCIUM 10 MG PO TABS: 20.0000 mg | ORAL_TABLET | Freq: Every day | ORAL | Status: DC

## 2021-03-02 MED ORDER — APIXABAN 5 MG PO TABS: 5.0000 mg | ORAL_TABLET | Freq: Two times a day (BID) | ORAL | Status: DC

## 2021-03-02 MED ORDER — SODIUM CHLORIDE 0.9 % IV SOLN
2.0000 g | Freq: Once | INTRAVENOUS | Status: AC
  Administered 2021-03-02: 2 g via INTRAVENOUS
  Filled 2021-03-02: qty 2

## 2021-03-02 MED ORDER — ONDANSETRON HCL 4 MG/2ML IJ SOLN: 4.0000 mg | Freq: Four times a day (QID) | INTRAMUSCULAR | Status: DC | PRN

## 2021-03-02 MED ORDER — LEVOTHYROXINE SODIUM 88 MCG PO TABS: 88.0000 ug | ORAL_TABLET | Freq: Every day | ORAL | Status: DC

## 2021-03-02 MED ORDER — METOPROLOL TARTRATE 25 MG PO TABS: 75.0000 mg | ORAL_TABLET | Freq: Every day | ORAL | Status: DC

## 2021-03-02 MED ORDER — LACTATED RINGERS IV BOLUS (SEPSIS)
1000.0000 mL | Freq: Once | INTRAVENOUS | Status: AC
  Administered 2021-03-02: 1000 mL via INTRAVENOUS

## 2021-03-02 MED ORDER — ACETAMINOPHEN 650 MG RE SUPP
650.0000 mg | Freq: Once | RECTAL | Status: AC
  Administered 2021-03-02: 650 mg via RECTAL
  Filled 2021-03-02: qty 1

## 2021-03-02 MED ORDER — METRONIDAZOLE IN NACL 5-0.79 MG/ML-% IV SOLN
500.0000 mg | Freq: Once | INTRAVENOUS | Status: AC
  Administered 2021-03-02: 500 mg via INTRAVENOUS
  Filled 2021-03-02: qty 100

## 2021-03-02 MED ORDER — LACTATED RINGERS IV SOLN: INTRAVENOUS | Status: DC

## 2021-03-02 MED ORDER — IPRATROPIUM-ALBUTEROL 0.5-2.5 (3) MG/3ML IN SOLN: 3.0000 mL | Freq: Three times a day (TID) | RESPIRATORY_TRACT | Status: DC

## 2021-03-02 MED ORDER — VANCOMYCIN HCL 1000 MG/200ML IV SOLN: 1000.0000 mg | INTRAVENOUS | Status: DC

## 2021-03-02 MED ORDER — SODIUM CHLORIDE 0.9 % IV SOLN: 2.0000 g | INTRAVENOUS | Status: DC

## 2021-03-02 MED ORDER — ONDANSETRON HCL 4 MG PO TABS: 4.0000 mg | ORAL_TABLET | Freq: Four times a day (QID) | ORAL | Status: DC | PRN

## 2021-03-02 MED ORDER — ACETAMINOPHEN 650 MG RE SUPP: 650.0000 mg | Freq: Four times a day (QID) | RECTAL | Status: DC | PRN

## 2021-03-02 MED ORDER — ACETAMINOPHEN 325 MG PO TABS: 650.0000 mg | ORAL_TABLET | Freq: Four times a day (QID) | ORAL | Status: DC | PRN

## 2021-03-02 MED ORDER — VANCOMYCIN HCL 1250 MG/250ML IV SOLN
1250.0000 mg | Freq: Once | INTRAVENOUS | Status: AC
  Administered 2021-03-02: 1250 mg via INTRAVENOUS
  Filled 2021-03-02: qty 250

## 2021-03-02 MED ORDER — METOPROLOL TARTRATE 25 MG PO TABS: 50.0000 mg | ORAL_TABLET | Freq: Every day | ORAL | Status: DC

## 2021-03-02 MED ORDER — SODIUM CHLORIDE 0.9 % IV SOLN: 1.0000 g | Freq: Three times a day (TID) | INTRAVENOUS | Status: DC

## 2021-03-02 NOTE — Assessment & Plan Note (Signed)
takes Senokot, MiraLax, Colace

## 2021-03-02 NOTE — Assessment & Plan Note (Signed)
O2 desaturation, O2 2lpm to bring Sat O2 >90%, mild wheezes noted posterior mid to lower lungs. Will have DuoNeb q8hr x 72 hours. CXR ap/lateral to evaluate.

## 2021-03-02 NOTE — ED Notes (Signed)
One set of cultures obtained

## 2021-03-02 NOTE — Assessment & Plan Note (Signed)
worsened today, CXR 02/10/21 no acute cardiopulmonary disease.cannot stay awake long enough to take oral meds, food, fluids.  No noted new  facial or limb weakness. HPOA declined ED eval presently. Will update CBC/diff, CMP/eGFR.

## 2021-03-02 NOTE — Assessment & Plan Note (Addendum)
takes Levothyroxicortisol unremarkable, TSH 3.494 01/28/21

## 2021-03-02 NOTE — Assessment & Plan Note (Signed)
CHF euvolemic orthostaticBun/creat 31/1.15 03/16/2021, EF 50-65%,

## 2021-03-02 NOTE — ED Triage Notes (Signed)
Pt aarrived via ems from a skilled facility due to complaints of an infection. EMS has been bagging pt due to periods of apnea. EMS reports pt has bronchis in both lungs, and an elevated wbc. Ems also reports pt has a fever of 103.6. Pt is not alert and orientated, ems reports pt has been this way the whole time. Family is on the way

## 2021-03-02 NOTE — Assessment & Plan Note (Signed)
Hyponatremia, improved, on 1545ml/day fluid restriction, Na 132 03/22/2021

## 2021-03-02 NOTE — ED Notes (Signed)
Md notified of pts lactic of 10.3

## 2021-03-02 NOTE — Progress Notes (Signed)
Location:    [facility] Room Number: 32 Place of Service:  SNF (31) Provider: Lennie Odor Zamir Staples NP  Lujean Amel, MD  Patient Care Team: Lujean Amel, MD as PCP - General (Family Medicine) Revankar, Reita Cliche, MD as PCP - Cardiology (Cardiology) Tanda Rockers, MD (Pulmonary Disease) Jacolyn Reedy, MD as Consulting Physician (Cardiology) Clent Jacks, MD as Consulting Physician (Ophthalmology) Hayden Pedro, MD as Consulting Physician (Ophthalmology) Gardiner Barefoot, DPM as Consulting Physician (Podiatry) Hayden Pedro, MD as Consulting Physician (Ophthalmology) Kathrynn Ducking, MD as Consulting Physician (Neurology) Revankar, Reita Cliche, MD as Consulting Physician (Cardiology)  Extended Emergency Contact Information Primary Emergency Contact: Spaeth,[name] Address: [address]          [place], [place] Work Phone: [phone] Mobile Phone: [phone] Relation: Daughter Preferred language: English Interpreter needed? No  Code Status:  DNR Goals of care: Advanced Directive information Advanced Directives 01/27/2021  Does Patient Have a Medical Advance Directive? Yes  Type of Paramedic of [place];Living will  Does patient want to make changes to medical advance directive? No - Guardian declined  Copy of [place] in Chart? -  Would patient like information on creating a medical advance directive? -  Pre-existing out of facility DNR order (yellow form or pink MOST form) -     Chief Complaint  Patient presents with  . Acute Visit    Lethargy, O2 desaturation    HPI:  Pt is a 87 y.o. female seen today for an acute visit for lethargy, cannot stay awake long enough to take oral meds, food, or fluid. O2 desaturation, O2 2lpm to bring Sat O2 >90%, mild wheezes noted posterior mid to lower lungs. No noted new  facial or limb weakness.   Hyponatremia, improved, on 1512m/day  fluid restriction, Na 132 02/26/2021  Acute CVA 01/2021 with R leg weakness ,trouble with word finding, per MRI left cerebral, embolic hemisphere, Eliquis was increased to 551mbid, takes Atorvastatin. Lethargy, worsened today, CXR 02/10/21 no acute cardiopulmonary disease. Orthostatic hypotension, Anemia chronic/Autoimmune hemolytic anemia, Hgb 7.7 03/21/2021 on Prednisone, s/p transfusion, f/u Oncology Hypothyroidism, takes Levothyroxicortisol unremarkable. TSH 3.494 01/28/21 AFib, EF 50-65%, LDL 48. F/u cardiology, takes Metoprolol, Eliquis.  CHF euvolemic orthostaticBun/creat 31/1.15 03/17/2021, EF 50-65%, CAD Status post CABG in 2013, has history of severe aortic stenosis status post tissue AVR in 2013. Follows with cardiology. Constipation, takes Senokot, MiraLax, Colace Urine frequency, takes Oxybutynin lumbar degenerative disc disease, takes Gabapentin, Prior lumbar laminectomy 12/2009 Dr. BrRolena Infante Failed epidural steroids.  Currently on gabapentin Confusion, in hospital, CT head 02/01/21 no acute intracranial abnormality. UTI, acute CVA, underlying dementia may be contributory. F/u neurology.        Past Medical History:  Diagnosis Date  . Acute blood loss anemia 05/16/2018  . AIHA (autoimmune hemolytic anemia) (HCC)   . Allergic rhinitis due to pollen 01/18/2021  . Anxiety disorder 01/18/2021  . Aortic stenosis   . Atherosclerotic heart disease of native coronary artery without angina pectoris 01/18/2021  . Atrial fibrillation ([place]   Noted during recent cardiac surgery and treated with amiodarone   . Autoimmune hemolytic anemia (HCC)   . AV heart block 11/23/2020  . CAD (coronary artery disease) 12/26/2011   Severe ostial LM disease noted at cath 12/26/11   . Chronic diastolic heart failure ([place]/24/2013  . Complication of surgical  procedure 01/18/2021  . Constipation 01/18/2021  . Coronary artery disease  Cath 1/13 left main  . 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 Dyspnea   . Elevated bilirubin 05/14/2018  . Essential hypertension 01/18/2021  . Hypercoagulable state ([place]) 01/18/2021  . Hyperlipidemia   . Hypertension   . Hypertensive heart disease without CHF   . Hyponatremia 01/17/2012  . Hypothyroidism   . Long term (current) use of anticoagulants 07/14/2013  . Lumbar degenerative disc disease    Prior lumbar laminectomy 12/2009 Dr. Rolena Infante.  Failed epidural steroids.  Currently on gabapentin   . Lumbar disc disease   . Lung nodules    Sees [facility], thought to be benign  . Memory problem 01/18/2021  . Mixed hyperlipidemia 01/18/2021  . Obesity   . Obstructive jaundice 05/14/2018  . Overactive bladder 01/18/2021  . PAF (paroxysmal atrial fibrillation) ([place]) 01/30/2019  . Paroxysmal atrial fibrillation ([place]) 01/30/2019  . Personal history of solitary pulmonary nodule 11/07/2011   Followed in Pulmonary clinic/ [facility]/ [facility]   - PET 11/19/11 indeterminant LUL nodule but slt reduced in size vs prev studies so rec f/u cxr in 2 months (tickle file) Wedge resection by Dr. Servando Snare at time of AVR/CABG 12/28/2011 >   wedge biopsy/resection, Left upper lobe - NON-NECROTIZING GRANULOMATOUS INFLAMMATION WITH ASSOCIATED MULTINUCLEATED GIANT CELLS.   . Polyp of colon 01/18/2021  . S/P AVR and CABG 12/28/2011   19 mm Magna Ease pericardial tissue valve CABG x 3 (LIMA-LAD, SVG-Int, SVG-dRCA)12/28/2011 Dr. Servando Snare    . Sciatica 01/18/2021  . Senile purpura ([place]) 01/18/2021  . Sepsis ([place]) 08/17/2018  . Spinal stenosis   . Villous adenoma of rectum    Past Surgical History:  Procedure Laterality Date  . AORTIC VALVE REPLACEMENT  12/28/2011   Procedure: AORTIC VALVE REPLACEMENT (AVR);  Surgeon: Grace Isaac, MD;  Location: [facility];  Service: Open Heart Surgery;  Laterality: N/A;  . CARDIAC CATHETERIZATION  2013   . CHOLECYSTECTOMY    . CORONARY ARTERY BYPASS GRAFT  12/28/2011   Procedure: CORONARY ARTERY BYPASS GRAFTING (CABG);  Surgeon: Grace Isaac, MD;  Location: [facility];  Service: Open Heart Surgery;  Laterality: N/A;  . HERNIA REPAIR    . LEFT AND RIGHT HEART CATHETERIZATION WITH CORONARY ANGIOGRAM N/A 12/26/2011   Procedure: LEFT AND RIGHT HEART CATHETERIZATION WITH CORONARY ANGIOGRAM;  Surgeon: Jacolyn Reedy, MD;  Location: [facility] CATH LAB;  Service: Cardiovascular;  Laterality: N/A;  . LUMBAR LAMINECTOMY    . PARTIAL COLECTOMY  2008   Villous adenoma    Allergies  Allergen Reactions  . Ibuprofen Other (See Comments)    Hx of GI bleed  . Tapentadol Other (See Comments)    Nucynta- Knows she "cannot take"- made the patient "very sick"   . Ace Inhibitors Cough  . Angiotensin Receptor Blockers Cough  . Dicloxacillin Nausea Only and Other (See Comments)    Made the patient lightheaded, also  . Doxycycline Nausea Only  . Hydrocodone Nausea Only and Other (See Comments)    Made the patient lightheaded, also  . Nsaids     Can tolerate only Tylenol (has a history of GI BLEEDS)  . Adhesive [Tape] Rash    Blisters (can use only paper tape)  . Latex Rash and Other (See Comments)    Paper tape only    Allergies as of 03/06/2021      Reactions   Ibuprofen Other (See Comments)   Hx of GI bleed   Tapentadol Other (See Comments)   Nucynta- Knows she "cannot take"-  made the patient "very sick"   Ace Inhibitors Cough   Angiotensin Receptor Blockers Cough   Dicloxacillin Nausea Only, Other (See Comments)   Made the patient lightheaded, also   Doxycycline Nausea Only   Hydrocodone Nausea Only, Other (See Comments)   Made the patient lightheaded, also   Nsaids    Can tolerate only Tylenol (has a history of GI BLEEDS)   Adhesive [tape] Rash   Blisters (can use only paper tape)   Latex Rash, Other (See Comments)   Paper tape only      Medication List       Accurate as of March 02, 2021   7:16 PM. If you have any questions, ask your nurse or doctor.        acetaminophen 325 MG tablet Commonly known as: TYLENOL Take 650 mg by mouth every morning.   apixaban 5 MG Tabs tablet Commonly known as: Eliquis Take 1 tablet (5 mg total) by mouth 2 (two) times daily.   atorvastatin 20 MG tablet Commonly known as: LIPITOR TAKE 1 TABLET BY MOUTH EVERYDAY AT BEDTIME   Besivance 0.6 % Susp Generic drug: Besifloxacin HCl Place 1 drop into the left eye See admin instructions. Place 1 drop left eye 4 times daily for 2 days after Avastin eye injections   CALCIUM 600 + D PO Take 1 tablet by mouth every morning.   cetirizine 10 MG tablet Commonly known as: ZYRTEC Take 10 mg by mouth daily in the afternoon.   docusate sodium 100 MG capsule Commonly known as: COLACE Take 100 mg by mouth daily in the afternoon.   feeding supplement Liqd Take 1 Container by mouth daily.   gabapentin 100 MG capsule Commonly known as: NEURONTIN Take 100 mg by mouth daily.   ipratropium-albuterol 0.5-2.5 (3) MG/3ML Soln Commonly known as: DUONEB Take 3 mLs by nebulization 3 (three) times daily.   levothyroxine 88 MCG tablet Commonly known as: SYNTHROID Take 88 mcg by mouth daily before breakfast.   Metoprolol Tartrate 75 MG Tabs Take 1 tablet by mouth daily.   metoprolol tartrate 50 MG tablet Commonly known as: LOPRESSOR Take 1 tablet (50 mg total) by mouth at bedtime.   Multi For Her 50+ Tabs Take 1 tablet by mouth daily in the afternoon.   nystatin powder Commonly known as: MYCOSTATIN/NYSTOP Apply topically 2 (two) times daily.   oxybutynin 5 MG tablet Commonly known as: DITROPAN Take 5 mg by mouth every other day.   polyethylene glycol 17 g packet Commonly known as: MIRALAX / GLYCOLAX Take 17 g by mouth 2 (two) times daily.   predniSONE 20 MG tablet Commonly known as: DELTASONE Take 60 mg by mouth daily with breakfast. For 5 days.   Santyl ointment Generic drug:  collagenase Apply 1 application topically See admin instructions. Apply daily as directed to the affected toe   senna 8.6 MG Tabs tablet Commonly known as: SENOKOT Take 2 tablets (17.2 mg total) by mouth daily.   zinc oxide 20 % ointment Apply 1 application topically as needed for irritation.       Review of Systems  Constitutional: Positive for activity change, appetite change and fatigue. Negative for chills, diaphoresis and fever.  HENT: Positive for hearing loss. Negative for congestion, trouble swallowing and voice change.   Respiratory: Positive for shortness of breath and wheezing. Negative for cough and chest tightness.   Cardiovascular: Positive for leg swelling. Negative for chest pain and palpitations.  Gastrointestinal: Negative for abdominal pain and constipation.  Genitourinary: Positive for frequency. Negative for dysuria and urgency.  Musculoskeletal: Positive for arthralgias, back pain and gait problem.  Skin:       Ecchymoses all R+L arms.   Neurological: Positive for weakness. Negative for seizures, speech difficulty, light-headedness and headaches.       Memory lapses. R leg weakness.   Psychiatric/Behavioral: Positive for confusion. Negative for agitation, behavioral problems and sleep disturbance. The patient is not nervous/anxious.     Immunization History  Administered Date(s) Administered  . Influenza Split 10/09/2007, 09/06/2009, 09/22/2010, 09/21/2011, 09/15/2012, 09/24/2013, 09/27/2014, 09/28/2015, 09/17/2016, 10/07/2019  . Influenza,inj,Quad PF,6+ Mos 09/18/2017, 09/25/2018, 09/30/2020  . Influenza-Unspecified 08/26/2018, 10/20/2019  . Moderna Sars-Covid-2 Vaccination 12/28/2019, 01/25/2020  . Pneumococcal Conjugate-13 04/21/2014  . Pneumococcal Polysaccharide-23 12/30/2003  . Td 12/30/2003, 10/21/2015  . Zoster 09/12/2006, 08/04/2018, 10/06/2018   There are no preventive care reminders to display for this patient. No flowsheet data  found. Functional Status Survey:    Vitals:   03/20/2021 1517  BP: 120/79  Pulse: 96  Resp: 20  Temp: 97.7 F (36.5 C)  SpO2: 90%  Weight: 136 lb 12.8 oz (62.1 kg)  Height: '5\' 4"'  (1.626 m)   Body mass index is 23.48 kg/m. Physical Exam Constitutional:      Comments: Open eyes when spoke to   HENT:     Head: Normocephalic and atraumatic.     Nose: Nose normal.     Mouth/Throat:     Mouth: Mucous membranes are dry.  Eyes:     Extraocular Movements: Extraocular movements intact.     Conjunctiva/sclera: Conjunctivae normal.     Pupils: Pupils are equal, round, and reactive to light.  Cardiovascular:     Rate and Rhythm: Normal rate and regular rhythm.     Heart sounds: No murmur heard.   Pulmonary:     Effort: Respiratory distress present.     Breath sounds: Wheezing present. No rhonchi or rales.  Abdominal:     General: Bowel sounds are normal.     Palpations: Abdomen is soft.     Tenderness: There is no abdominal tenderness.  Musculoskeletal:     Cervical back: Normal range of motion and neck supple.     Right lower leg: Edema present.     Left lower leg: Edema present.     Comments: Trace edema BLE  Skin:    General: Skin is warm and dry.     Findings: Bruising present.     Comments: Multiple ecchymoses arms, legs. Dark purple discoloration BLE from knee down.   Neurological:     Mental Status: She is alert.     Cranial Nerves: No cranial nerve deficit.     Motor: Weakness present.     Coordination: Coordination abnormal.     Gait: Gait abnormal.     Comments: Right leg weakness.   Psychiatric:     Comments: Lethargic, opened eyes when spoke to .      Labs reviewed: Recent Labs    01/29/21 0202 01/30/21 0253 01/31/21 1214 02/01/21 0337 02/24/21 1015 03/18/2021 1414 03/15/2021 1924  NA 131*   < >  --    < > 131* 132* 137  K 3.8   < >  --    < > 4.2 4.8 4.2  CL 98   < >  --    < > 100 101 108  CO2 25   < >  --    < > 22 24 16*  GLUCOSE 108*   < >  --     < >  165* 189* 108*  BUN 20   < >  --    < > 32* 31* 45*  CREATININE 1.00   < >  --    < > 1.03* 1.15* 1.61*  CALCIUM 8.8*   < >  --    < > 8.9 9.0 8.4*  MG 2.1  --  2.0  --   --   --   --    < > = values in this interval not displayed.   Recent Labs    02/24/21 1015 03/11/2021 1414 02/28/2021 1924  AST 27 49* 72*  ALT 18 20 36  ALKPHOS 67 66 68  BILITOT 1.2 1.0 0.9  PROT 6.2* 6.1* 4.6*  ALBUMIN 3.0* 3.1* 2.4*   Recent Labs    02/24/21 1015 03/23/2021 1414 03/12/2021 1924  WBC 10.3 12.6* 23.3*  NEUTROABS 8.3* 10.6* 22.6*  HGB 8.0* 7.7* 9.9*  HCT 25.9* 25.4* 32.8*  MCV 90.6 89.8 94.3  PLT 279 267 127*   Lab Results  Component Value Date   TSH 3.494 01/28/2021   Lab Results  Component Value Date   HGBA1C 4.5 (L) 01/28/2021   Lab Results  Component Value Date   CHOL 123 01/28/2021   HDL 55 01/28/2021   LDLCALC 48 01/28/2021   TRIG 102 01/28/2021   CHOLHDL 2.2 01/28/2021    Significant Diagnostic Results in last 30 days:  DG Abd 1 View  Result Date: 02/05/2021 CLINICAL DATA:  Abdominal distension EXAM: ABDOMEN - 1 VIEW COMPARISON:  February 01, 2021 FINDINGS: No appreciable bowel dilatation or air-fluid level to suggest bowel obstruction. No free air. Lung bases clear. Postoperative changes noted in the upper abdomen. There is aortic and splenic artery atherosclerotic calcification. IMPRESSION: No bowel obstruction or free air. Lung bases clear. Aortic Atherosclerosis (ICD10-I70.0). Electronically Signed   By: Lowella Grip III M.D.   On: 02/05/2021 09:00   DG Abd 1 View  Result Date: 02/01/2021 CLINICAL DATA:  Abdominal pain and distension EXAM: ABDOMEN - 1 VIEW COMPARISON:  CT abdomen 05/14/2018 FINDINGS: Small bowel pattern is unremarkable. Large amount of gas and stool throughout the colon. Peripheral gas pattern in the rectum presumed to be peripheral gas around impacted stool. The unlikely possibility pneumatosis is not excluded. Arterial vascular calcification is  noted throughout. Clips in the right upper quadrant consistent with previous cholecystectomy. Scoliosis and degenerative changes of the spine. IMPRESSION: Large amount of gas and stool throughout the colon. Peripheral gas pattern in the rectum presumed to be peripheral gas around impacted stool. The unlikely possibility of pneumatosis is not excluded. If there is clinical concern regarding the possibility bowel infarction, consider CT of the abdomen pelvis. Otherwise, the findings could simply be due to constipation. Electronically Signed   By: Nelson Chimes M.D.   On: 02/01/2021 19:26   CT HEAD WO CONTRAST  Result Date: 02/01/2021 CLINICAL DATA:  Delirium. EXAM: CT HEAD WITHOUT CONTRAST TECHNIQUE: Contiguous axial images were obtained from the base of the skull through the vertex without intravenous contrast. COMPARISON:  Noncontrast head CT 01/27/2021. Brain MRI 01/27/2021. CT angiogram head/neck 01/28/2021. FINDINGS: Brain: Moderate cerebral and cerebellar atrophy. Known early subacute infarct within the left corona radiata/basal ganglia. Background moderate multifocal T2/FLAIR hyperintensity within the cerebral white matter is nonspecific compatible with chronic small vessel ischemic disease. Redemonstrated small early subacute infarct within the left cerebellar hemisphere. Superimposed small chronic infarcts within the bilateral cerebellar hemispheres. There is no acute intracranial hemorrhage. No extra-axial fluid collection. No evidence  of intracranial mass. No midline shift. Vascular: No hyperdense vessel.  Atherosclerotic calcifications. Skull: Normal. Negative for fracture or focal lesion. Sinuses/Orbits: Visualized orbits show no acute finding. Trace ethmoid sinus mucosal thickening. IMPRESSION: No evidence of interval acute intracranial abnormality. Known subacute infarcts within the left corona radiata/basal ganglia and left cerebellar hemisphere. Stable background moderate generalized atrophy of the  brain and chronic small vessel ischemic disease. Unchanged small remote infarcts within the bilateral cerebellar hemispheres. Electronically Signed   By: Kellie Simmering DO   On: 02/01/2021 14:59   MR BRAIN WO CONTRAST  Result Date: 02/03/2021 CLINICAL DATA:  Delirium.  Recent stroke. EXAM: MRI HEAD WITHOUT CONTRAST TECHNIQUE: Multiplanar, multiecho pulse sequences of the brain and surrounding structures were obtained without intravenous contrast. COMPARISON:  Head CT 02/01/2021 and MRI 01/27/2021 FINDINGS: Brain: Recent infarcts are again noted in the left corona radiata and left cerebellum. The left corona radiata infarct has slightly enlarged from the prior MRI while the cerebellar infarct is unchanged. No new infarct is identified elsewhere. Background patchy to confluent T2 hyperintensities in the cerebral white matter bilaterally are unchanged and nonspecific but compatible with moderate chronic small-vessel ischemic disease. Chronic bilateral cerebellar infarcts are again noted. There is moderate cerebral atrophy. Vascular: Major intracranial vascular flow voids are preserved. Skull and upper cervical spine: Unremarkable bone marrow signal. Sinuses/Orbits: Bilateral cataract extraction. Paranasal sinuses and mastoid air cells are clear. Other: None. IMPRESSION: 1. Slight enlargement of the left corona radiata infarct since the 01/27/2021 MRI. 2. Unchanged subacute left cerebellar infarct. 3. Moderate chronic small-vessel ischemic disease. Electronically Signed   By: Logan Bores M.D.   On: 02/03/2021 13:04   DG Chest Port 1 View  Result Date: 03/01/2021 CLINICAL DATA:  Questionable sepsis, history of hypertension, PF, lung nodules EXAM: PORTABLE CHEST 1 VIEW COMPARISON:  Radiograph 08/17/2018 FINDINGS: Low lung volumes and atelectatic changes. Some coarsened reticular opacities are fairly similar to prior counting for differences in technique and inflation. Postsurgical changes noted in the left upper  lung likely for resection of nodule seen on comparison CT imaging. No confluent opacity. No convincing features of edema. No pneumothorax or visible layering effusion. Postsurgical changes related to prior CABG and bioprosthetic aortic valve replacement including intact and aligned sternotomy wires and multiple surgical clips projecting over the mediastinum. The aorta is calcified. The remaining cardiomediastinal contours are unremarkable. Degenerative changes are present in the imaged spine and shoulders. Soft tissue mineralization near the rotator cuff musculature may reflect hydroxyapatite deposition. IMPRESSION: 1. Low lung volumes with atelectatic changes. 2. Postsurgical changes in the left upper lung likely for resection of nodule seen on comparison CT imaging. 3. No acute cardiopulmonary abnormality. 4. Aortic atherosclerosis. 5. Prior sternotomy, CABG and aortic valve replacement. Electronically Signed   By: Lovena Le M.D.   On: 03/13/2021 20:11    Assessment/Plan Lethargy worsened today, CXR 02/10/21 no acute cardiopulmonary disease.cannot stay awake long enough to take oral meds, food, fluids.  No noted new  facial or limb weakness. HPOA declined ED eval presently. Will update CBC/diff, CMP/eGFR.   Hypoxia O2 desaturation, O2 2lpm to bring Sat O2 >90%, mild wheezes noted posterior mid to lower lungs. Will have DuoNeb q8hr x 72 hours. CXR ap/lateral to evaluate.   Hyponatremia Hyponatremia, improved, on 1538m/day fluid restriction, Na 132 03/12/2021  Acute CVA (cerebrovascular accident) ([place] Acute CVA 01/2021 with R leg weakness ,trouble with word finding, per MRI left cerebral, embolic hemisphere, Eliquis was increased to 583mbid, takes Atorvastatin.  Orthostatic hypotension chronic  Autoimmune hemolytic anemia (HCC) Anemia chronic/Autoimmune hemolytic anemia, Hgb 7.7 02/23/2021 on Prednisone, s/p transfusion, f/u Oncology   Hypothyroidism  takes Levothyroxicortisol unremarkable, TSH  3.494 01/28/21  Atrial fibrillation (HCC) AFib, EF 50-65%, LDL 48. F/u cardiology, takes Metoprolol, Eliquis.    Chronic diastolic heart failure (HCC) CHF euvolemic orthostaticBun/creat 31/1.15 02/28/2021, EF 50-65%,   Atherosclerotic heart disease of native coronary artery without angina pectoris Status post CABG in 2013, has history of severe aortic stenosis status post tissue AVR in 2013. Follows with cardiology.  Constipation takes Senokot, MiraLax, Colace   Overactive bladder  takes Oxybutynin  Lumbar degenerative disc disease  takes Gabapentin, Prior lumbar laminectomy 12/2009 Dr. Rolena Infante.  Failed epidural steroids.  Currently on gabapentin   Memory problem Confusion, in hospital, CT head 02/01/21 no acute intracranial abnormality. UTI, acute CVA, underlying dementia may be contributory. F/u neurology.       Family/ staff Communication: plan of care reviewed with the patient, the patient HPOA daughter, and charge nurse.   Labs/tests ordered: CBC/diff, CMP/eGFR, CXR ap/lateral.   Time spend 35 minutes.

## 2021-03-02 NOTE — Assessment & Plan Note (Signed)
chronic

## 2021-03-02 NOTE — Assessment & Plan Note (Signed)
Acute CVA 01/2021 with R leg weakness ,trouble with word finding, per MRI left cerebral, embolic hemisphere, Eliquis was increased to 5mg  bid, takes Atorvastatin.

## 2021-03-02 NOTE — Progress Notes (Signed)
Following for Code Sepsis  

## 2021-03-02 NOTE — Assessment & Plan Note (Signed)
AFib, EF 50-65%, LDL 48. F/u cardiology, takes Metoprolol, Eliquis.

## 2021-03-02 NOTE — ED Notes (Signed)
Only have one iv access, the iv ems placed does not work with fluids. Waiting on iv team for another iv to hang other medications that are incompatible with antibiotic currently hanging

## 2021-03-02 NOTE — ED Provider Notes (Addendum)
[facility] EMERGENCY DEPARTMENT Provider Note   CSN: [id number] Arrival date & time: 02/22/2021  1916     History Chief Complaint  Patient presents with  . Blood Infection    Carol [name] is a 87 y.o. female.  87 year old female presents from facility for leukocytosis and fever.  According to her daughter, patient was afebrile earlier this afternoon but then suddenly developed a fever.  Blood work was performed at the facility which does show a increased white blood cell count 25,000.  Patient's temperature was 103.6.  Patient was mainly responsive according to EMS.  She did require bag mask valve assistance.  Patient is a DNR and does not want mechanical ventilation or chest compressions        Past Medical History:  Diagnosis Date  . Acute blood loss anemia 05/16/2018  . AIHA (autoimmune hemolytic anemia) (HCC)   . Allergic rhinitis due to pollen 01/18/2021  . Anxiety disorder 01/18/2021  . Aortic stenosis   . Atherosclerotic heart disease of native coronary artery without angina pectoris 01/18/2021  . Atrial fibrillation ([place])    Noted during recent cardiac surgery and treated with amiodarone   . Autoimmune hemolytic anemia (HCC)   . AV heart block 11/23/2020  . CAD (coronary artery disease) 12/26/2011   Severe ostial LM disease noted at cath 12/26/11   . Chronic diastolic heart failure ([place]) 01/17/2012  . Complication of surgical procedure 01/18/2021  . Constipation 01/18/2021  . Coronary artery disease    Cath 1/13 left main  . 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 Dyspnea   . Elevated bilirubin 05/14/2018  . Essential hypertension 01/18/2021  . Hypercoagulable state ([place]) 01/18/2021  . Hyperlipidemia   . Hypertension   . Hypertensive heart disease without CHF   . Hyponatremia 01/17/2012  . Hypothyroidism   . Long term (current) use of anticoagulants 07/14/2013  . Lumbar degenerative disc disease    Prior lumbar laminectomy  12/2009 Dr. Rolena Infante.  Failed epidural steroids.  Currently on gabapentin   . Lumbar disc disease   . Lung nodules    Sees [facility], thought to be benign  . Memory problem 01/18/2021  . Mixed hyperlipidemia 01/18/2021  . Obesity   . Obstructive jaundice 05/14/2018  . Overactive bladder 01/18/2021  . PAF (paroxysmal atrial fibrillation) ([place]) 01/30/2019  . Paroxysmal atrial fibrillation ([place]) 01/30/2019  . Personal history of solitary pulmonary nodule 11/07/2011   Followed in Pulmonary clinic/ [facility]/ [facility]   - PET 11/19/11 indeterminant LUL nodule but slt reduced in size vs prev studies so rec f/u cxr in 2 months (tickle file) Wedge resection by Dr. Servando Snare at time of AVR/CABG 12/28/2011 >   wedge biopsy/resection, Left upper lobe - NON-NECROTIZING GRANULOMATOUS INFLAMMATION WITH ASSOCIATED MULTINUCLEATED GIANT CELLS.   . Polyp of colon 01/18/2021  . S/P AVR and CABG 12/28/2011   19 mm Magna Ease pericardial tissue valve CABG x 3 (LIMA-LAD, SVG-Int, SVG-dRCA)12/28/2011 Dr. Servando Snare    . Sciatica 01/18/2021  . Senile purpura ([place]) 01/18/2021  . Sepsis ([place]) 08/17/2018  . Spinal stenosis   . Villous adenoma of rectum     Patient Active Problem List   Diagnosis Date Noted  . Hypoxia 02/28/2021  . Leukocytosis 02/10/2021  . Lethargy 02/10/2021  . Orthostatic hypotension 02/07/2021  . Acute CVA (cerebrovascular accident) ([place]) 01/27/2021  . Right leg weakness 01/27/2021  . Allergic rhinitis due to pollen 01/18/2021  . Anxiety disorder 01/18/2021  . Complication of surgical  procedure 01/18/2021  . Constipation 01/18/2021  . Hypercoagulable state ([place]) 01/18/2021  . Memory problem 01/18/2021  . Overactive bladder 01/18/2021  . Polyp of colon 01/18/2021  . Sciatica 01/18/2021  . Senile purpura ([place]) 01/18/2021  . Atherosclerotic heart disease of native coronary artery without angina pectoris 01/18/2021  . Mixed hyperlipidemia 01/18/2021  . Essential hypertension 01/18/2021  . AV heart block  11/23/2020  . Coronary artery disease   . Dyspnea   . Hypertension   . Lumbar disc disease   . Lung nodules   . Obesity   . Spinal stenosis   . Paroxysmal atrial fibrillation ([place]) 01/30/2019  . Sepsis ([place]) 08/17/2018  . Autoimmune hemolytic anemia (HCC)   . Acute blood loss anemia 05/14/2018  . Elevated bilirubin 05/14/2018  . Obstructive jaundice 05/14/2018  . 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 Long term (current) use of anticoagulants 07/14/2013  . Chronic diastolic heart failure ([place]) 01/17/2012  . Hyponatremia 01/17/2012  . Atrial fibrillation ([place])   . 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 CAD (coronary artery disease) 12/26/2011  . History of Villous adenoma of rectum   . Hypertensive heart disease without CHF   . Hyperlipidemia   . Hypothyroidism   . Lumbar degenerative disc disease   . Aortic stenosis 11/08/2011  . Personal history of solitary pulmonary nodule 11/07/2011    Past Surgical History:  Procedure Laterality Date  . AORTIC VALVE REPLACEMENT  12/28/2011   Procedure: AORTIC VALVE REPLACEMENT (AVR);  Surgeon: Grace Isaac, MD;  Location: [facility];  Service: Open Heart Surgery;  Laterality: N/A;  . CARDIAC CATHETERIZATION  2013  . CHOLECYSTECTOMY    . CORONARY ARTERY BYPASS GRAFT  12/28/2011   Procedure: CORONARY ARTERY BYPASS GRAFTING (CABG);  Surgeon: Grace Isaac, MD;  Location: [facility];  Service: Open Heart Surgery;  Laterality: N/A;  . HERNIA REPAIR    . LEFT AND RIGHT HEART CATHETERIZATION WITH CORONARY ANGIOGRAM N/A 12/26/2011   Procedure: LEFT AND RIGHT HEART CATHETERIZATION WITH CORONARY ANGIOGRAM;  Surgeon: Jacolyn Reedy, MD;  Location: [facility] CATH LAB;  Service: Cardiovascular;  Laterality: N/A;  . LUMBAR LAMINECTOMY    . PARTIAL COLECTOMY  2008   Villous adenoma     OB History   No obstetric history on file.     Family History  Problem Relation Age of Onset  . Lymphoma Sister   . Cancer Sister   . Breast cancer Sister    . Cancer Mother   . Heart attack Father   . Heart disease Father   . Heart failure Brother     Social History   Tobacco Use  . Smoking status: Former Smoker    Packs/day: 0.50    Years: 10.00    Pack years: 5.00    Types: Cigarettes    Quit date: 12/24/1978    Years since quitting: 42.2  . Smokeless tobacco: Never Used  Vaping Use  . Vaping Use: Never used  Substance Use Topics  . Alcohol use: No  . Drug use: No    Home Medications Prior to Admission medications   Medication Sig Start Date End Date Taking? Authorizing Provider  acetaminophen (TYLENOL) 325 MG tablet Take 650 mg by mouth every morning.    [provider]  apixaban (ELIQUIS) 5 MG TABS tablet Take 1 tablet (5 mg total) by mouth 2 (two) times daily. 02/01/2021   Shelly Coss, MD  atorvastatin (LIPITOR) 20 MG tablet TAKE 1 TABLET BY  MOUTH EVERYDAY AT BEDTIME 12/06/20   Revankar, Reita Cliche, MD  BESIVANCE 0.6 % SUSP Place 1 drop into the left eye See admin instructions. Place 1 drop left eye 4 times daily for 2 days after Avastin eye injections 11/23/16   [provider]  Calcium Carbonate-Vitamin D (CALCIUM 600 + D PO) Take 1 tablet by mouth every morning.    [provider]  cetirizine (ZYRTEC) 10 MG tablet Take 10 mg by mouth daily in the afternoon.    [provider]  docusate sodium (COLACE) 100 MG capsule Take 100 mg by mouth daily in the afternoon.    [provider]  feeding supplement (BOOST HIGH PROTEIN) LIQD Take 1 Container by mouth daily.    [provider]  gabapentin (NEURONTIN) 100 MG capsule Take 100 mg by mouth daily. 06/24/15   [provider]  ipratropium-albuterol (DUONEB) 0.5-2.5 (3) MG/3ML SOLN Take 3 mLs by nebulization 3 (three) times daily.    [provider]  levothyroxine (SYNTHROID, LEVOTHROID) 88 MCG tablet Take 88 mcg by mouth daily before breakfast.  06/28/15   [provider]  metoprolol tartrate (LOPRESSOR) 50 MG  tablet Take 1 tablet (50 mg total) by mouth at bedtime. 02/07/2021   Shelly Coss, MD  Metoprolol Tartrate 75 MG TABS Take 1 tablet by mouth daily.    [provider]  Multiple Vitamins-Minerals (MULTI FOR HER 50+) TABS Take 1 tablet by mouth daily in the afternoon.    [provider]  nystatin (MYCOSTATIN/NYSTOP) powder Apply topically 2 (two) times daily. 01/28/2021   Shelly Coss, MD  oxybutynin (DITROPAN) 5 MG tablet Take 5 mg by mouth every other day.    [provider]  polyethylene glycol (MIRALAX / GLYCOLAX) 17 g packet Take 17 g by mouth 2 (two) times daily. 02/16/2021   Shelly Coss, MD  predniSONE (DELTASONE) 20 MG tablet Take 40 mg by mouth daily with breakfast.    [provider]  SANTYL ointment Apply 1 application topically See admin instructions. Apply daily as directed to the affected toe 01/11/21   [provider]  senna (SENOKOT) 8.6 MG TABS tablet Take 2 tablets (17.2 mg total) by mouth daily. 02/07/21   Shelly Coss, MD  zinc oxide 20 % ointment Apply 1 application topically as needed for irritation.    [provider]    Allergies    Ibuprofen, Tapentadol, Ace inhibitors, Angiotensin receptor blockers, Dicloxacillin, Doxycycline, Hydrocodone, Nsaids, Adhesive [tape], and Latex  Review of Systems   Review of Systems  Unable to perform ROS: Mental status change    Physical Exam Updated Vital Signs BP 116/69 (BP Location: Right Arm)   Pulse (!) 118   Resp (!) 26   Physical Exam Vitals and nursing note reviewed.  Constitutional:      General: She is not in acute distress.    Appearance: Normal appearance. She is well-developed. She is not toxic-appearing.  HENT:     Head: Normocephalic and atraumatic.  Eyes:     General: Lids are normal.     Conjunctiva/sclera: Conjunctivae normal.     Pupils: Pupils are equal, round, and reactive to light.  Neck:     Thyroid: No thyroid mass.     Trachea: No tracheal  deviation.  Cardiovascular:     Rate and Rhythm: Regular rhythm. Tachycardia present.     Heart sounds: Normal heart sounds. No murmur heard. No gallop.   Pulmonary:     Effort: Pulmonary effort is  normal. No respiratory distress.     Breath sounds: Normal breath sounds. No stridor. No decreased breath sounds, wheezing, rhonchi or rales.  Abdominal:     General: Bowel sounds are normal. There is no distension.     Palpations: Abdomen is soft.     Tenderness: There is no abdominal tenderness. There is no rebound.  Musculoskeletal:        General: No tenderness. Normal range of motion.     Cervical back: Normal range of motion and neck supple.  Skin:    General: Skin is warm and dry.     Findings: No abrasion or rash.  Neurological:     Mental Status: She is lethargic and disoriented.     GCS: GCS eye subscore is 2. GCS verbal subscore is 3. GCS motor subscore is 3.     Comments: Patient unable to participate in exam  Psychiatric:        Attention and Perception: She is inattentive.     ED Results / Procedures / Treatments   Labs (all labs ordered are listed, but only abnormal results are displayed) Labs Reviewed  RESP PANEL BY RT-PCR (FLU A&B, COVID) ARPGX2  CULTURE, BLOOD (ROUTINE X 2)  CULTURE, BLOOD (ROUTINE X 2)  URINE CULTURE  LACTIC ACID, PLASMA  LACTIC ACID, PLASMA  COMPREHENSIVE METABOLIC PANEL  CBC WITH DIFFERENTIAL/PLATELET  PROTIME-INR  APTT  URINALYSIS, ROUTINE W REFLEX MICROSCOPIC    EKG None  Radiology No results found.  Procedures Procedures   Medications Ordered in ED Medications  lactated ringers infusion (has no administration in time range)  lactated ringers bolus 1,000 mL (has no administration in time range)    And  lactated ringers bolus 1,000 mL (has no administration in time range)  ceFEPIme (MAXIPIME) 2 g in sodium chloride 0.9 % 100 mL IVPB (has no administration in time range)  metroNIDAZOLE (FLAGYL) IVPB 500 mg (has no  administration in time range)  vancomycin (VANCOREADY) IVPB 1000 mg/200 mL (has no administration in time range)  acetaminophen (TYLENOL) suppository 650 mg (has no administration in time range)    ED Course  I have reviewed the triage vital signs and the nursing notes.  Pertinent labs & imaging results that were available during my care of the patient were reviewed by me and considered in my medical decision making (see chart for details).    MDM Rules/Calculators/A&P                          Code sepsis called and patient arrived.  Tylenol rectally ordered.  Long discussion was performed with patient's daughter who confirms that patient is a DNR including no intubation or compressions.  Patient's lactic acid is 10 and she has significant leukocytosis of 23.3.  Patient was admitted to the hospital  CRITICAL CARE Performed by: Leota Jacobsen Total critical care time: 55 minutes Critical care time was exclusive of separately billable procedures and treating other patients. Critical care was necessary to treat or prevent imminent or life-threatening deterioration. Critical care was time spent personally by me on the following activities: development of treatment plan with patient and/or surrogate as well as nursing, discussions with consultants, evaluation of patient's response to treatment, examination of patient, obtaining history from patient or surrogate, ordering and performing treatments and interventions, ordering and review of laboratory studies, ordering and review of radiographic studies, pulse oximetry and re-evaluation of patient's condition.  Final Clinical Impression(s) / ED Diagnoses Final  diagnoses:  None    Rx / DC Orders ED Discharge Orders    None       Lacretia Leigh, MD 03/08/2021 2043    Lacretia Leigh, MD 02/26/2021 [id number]

## 2021-03-02 NOTE — Assessment & Plan Note (Signed)
Anemia chronic/Autoimmune hemolytic anemia, Hgb 7.7 03/08/2021 on Prednisone, s/p transfusion, f/u Oncology

## 2021-03-02 NOTE — Assessment & Plan Note (Signed)
takes Oxybutynin

## 2021-03-02 NOTE — H&P (Signed)
History and Physical    Carol [name]:[id number] DOB: [birth year] DOA: 02/22/2021  PCP: Lujean Amel, MD   Patient coming from: SNF  Chief Complaint: Fever, decreased responsiveness, respiratory distress  HPI: Carol [name] is a 87 y.o. female with medical history significant for stroke, HTN, immune hemolytic anemia, diastolic CHF, CAD, status post aortic valve replacement and CABG, paroxysmal atrial fibrillation who presents from SNF by EMS with report of leukocytosis and fever.  According to her daughter, patient was afebrile earlier this afternoon but then suddenly developed a fever.  She has had multiple episodes of loose stool today according to the daughter. Blood work was performed at the facility which does show a increased white blood cell count 25,000. Patient's temperature was 103.6.  She developed respiratory distress and tachypnea. Patient was minimally responsive according to EMS.  She did require bag mask valve assistance.  Patient is a DNR and does not want mechanical ventilation or chest compressions She did have a stroke a month ago and has been at skilled nursing facility getting the speech and physical therapy rehabilitation.  She is on mechanical soft diet according to the daughter.  Is unknown if she has been having difficulty with swallowing or had any aspiration event.  ED Course: She is found to have elevated lactic acid level, elevated WBC, decreased responsiveness, hypoxia.  Urinalysis been ordered but has not been obtained yet.  Chest x-ray does not show any acute infiltrate.  Patient is diagnosed with sepsis and given IV fluid hydration and started on empiric antibiotics  Review of Systems:  Unable to obtain secondary to acute condition  Past Medical History:  Diagnosis Date  . Acute blood loss anemia 05/16/2018  . AIHA (autoimmune hemolytic anemia) (HCC)   . Allergic rhinitis due to pollen 01/18/2021  . Anxiety disorder 01/18/2021  . Aortic stenosis    . Atherosclerotic heart disease of native coronary artery without angina pectoris 01/18/2021  . Atrial fibrillation ([place])    Noted during recent cardiac surgery and treated with amiodarone   . Autoimmune hemolytic anemia (HCC)   . AV heart block 11/23/2020  . CAD (coronary artery disease) 12/26/2011   Severe ostial LM disease noted at cath 12/26/11   . Chronic diastolic heart failure ([place]) 01/17/2012  . Complication of surgical procedure 01/18/2021  . Constipation 01/18/2021  . Coronary artery disease    Cath 1/13 left main  . 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 Dyspnea   . Elevated bilirubin 05/14/2018  . Essential hypertension 01/18/2021  . Hypercoagulable state ([place]) 01/18/2021  . Hyperlipidemia   . Hypertension   . Hypertensive heart disease without CHF   . Hyponatremia 01/17/2012  . Hypothyroidism   . Long term (current) use of anticoagulants 07/14/2013  . Lumbar degenerative disc disease    Prior lumbar laminectomy 12/2009 Dr. Rolena Infante.  Failed epidural steroids.  Currently on gabapentin   . Lumbar disc disease   . Lung nodules    Sees [facility], thought to be benign  . Memory problem 01/18/2021  . Mixed hyperlipidemia 01/18/2021  . Obesity   . Obstructive jaundice 05/14/2018  . Overactive bladder 01/18/2021  . PAF (paroxysmal atrial fibrillation) ([place]) 01/30/2019  . Paroxysmal atrial fibrillation ([place]) 01/30/2019  . Personal history of solitary pulmonary nodule 11/07/2011   Followed in Pulmonary clinic/ [facility]/ [facility]   - PET 11/19/11 indeterminant LUL nodule but slt reduced in size vs prev studies so rec f/u cxr in 2 months (tickle file)  Wedge resection by Dr. Servando Snare at time of AVR/CABG 12/28/2011 >   wedge biopsy/resection, Left upper lobe - NON-NECROTIZING GRANULOMATOUS INFLAMMATION WITH ASSOCIATED MULTINUCLEATED GIANT CELLS.   . Polyp of colon 01/18/2021  . S/P AVR and CABG 12/28/2011   19 mm Magna Ease pericardial tissue valve CABG x 3 (LIMA-LAD, SVG-Int,  SVG-dRCA)12/28/2011 Dr. Servando Snare    . Sciatica 01/18/2021  . Senile purpura ([place]) 01/18/2021  . Sepsis ([place]) 08/17/2018  . Spinal stenosis   . Villous adenoma of rectum     Past Surgical History:  Procedure Laterality Date  . AORTIC VALVE REPLACEMENT  12/28/2011   Procedure: AORTIC VALVE REPLACEMENT (AVR);  Surgeon: Grace Isaac, MD;  Location: [place];  Service: Open Heart Surgery;  Laterality: N/A;  . CARDIAC CATHETERIZATION  2013  . CHOLECYSTECTOMY    . CORONARY ARTERY BYPASS GRAFT  12/28/2011   Procedure: CORONARY ARTERY BYPASS GRAFTING (CABG);  Surgeon: Grace Isaac, MD;  Location: [place];  Service: Open Heart Surgery;  Laterality: N/A;  . HERNIA REPAIR    . LEFT AND RIGHT HEART CATHETERIZATION WITH CORONARY ANGIOGRAM N/A 12/26/2011   Procedure: LEFT AND RIGHT HEART CATHETERIZATION WITH CORONARY ANGIOGRAM;  Surgeon: Jacolyn Reedy, MD;  Location: [facility] CATH LAB;  Service: Cardiovascular;  Laterality: N/A;  . LUMBAR LAMINECTOMY    . PARTIAL COLECTOMY  2008   Villous adenoma    Social History  reports that she quit smoking about 42 years ago. Her smoking use included cigarettes. She has a 5.00 pack-year smoking history. She has never used smokeless tobacco. She reports that she does not drink alcohol and does not use drugs.  Allergies  Allergen Reactions  . Ibuprofen Other (See Comments)    Hx of GI bleed  . Tapentadol Other (See Comments)    Nucynta- Knows she "cannot take"- made the patient "very sick"   . Ace Inhibitors Cough  . Angiotensin Receptor Blockers Cough  . Dicloxacillin Nausea Only and Other (See Comments)    Made the patient lightheaded, also  . Doxycycline Nausea Only  . Hydrocodone Nausea Only and Other (See Comments)    Made the patient lightheaded, also  . Nsaids     Can tolerate only Tylenol (has a history of GI BLEEDS)  . Adhesive [Tape] Rash    Blisters (can use only paper tape)  . Latex Rash and Other (See Comments)    Paper tape only    Family  History  Problem Relation Age of Onset  . Lymphoma Sister   . Cancer Sister   . Breast cancer Sister   . Cancer Mother   . Heart attack Father   . Heart disease Father   . Heart failure Brother      Prior to Admission medications   Medication Sig Start Date End Date Taking? Authorizing Provider  acetaminophen (TYLENOL) 325 MG tablet Take 650 mg by mouth every morning.    [provider]  apixaban (ELIQUIS) 5 MG TABS tablet Take 1 tablet (5 mg total) by mouth 2 (two) times daily. 02/04/2021   Shelly Coss, MD  atorvastatin (LIPITOR) 20 MG tablet TAKE 1 TABLET BY MOUTH EVERYDAY AT BEDTIME 12/06/20   Revankar, Reita Cliche, MD  BESIVANCE 0.6 % SUSP Place 1 drop into the left eye See admin instructions. Place 1 drop left eye 4 times daily for 2 days after Avastin eye injections 11/23/16   [provider]  Calcium Carbonate-Vitamin D (CALCIUM 600 + D PO) Take 1 tablet by  mouth every morning.    [provider]  cetirizine (ZYRTEC) 10 MG tablet Take 10 mg by mouth daily in the afternoon.    [provider]  docusate sodium (COLACE) 100 MG capsule Take 100 mg by mouth daily in the afternoon.    [provider]  feeding supplement (BOOST HIGH PROTEIN) LIQD Take 1 Container by mouth daily.    [provider]  gabapentin (NEURONTIN) 100 MG capsule Take 100 mg by mouth daily. 06/24/15   [provider]  ipratropium-albuterol (DUONEB) 0.5-2.5 (3) MG/3ML SOLN Take 3 mLs by nebulization 3 (three) times daily.    [provider]  levothyroxine (SYNTHROID, LEVOTHROID) 88 MCG tablet Take 88 mcg by mouth daily before breakfast.  06/28/15   [provider]  metoprolol tartrate (LOPRESSOR) 50 MG tablet Take 1 tablet (50 mg total) by mouth at bedtime. 01/26/2021   Shelly Coss, MD  Metoprolol Tartrate 75 MG TABS Take 1 tablet by mouth daily.    [provider]  Multiple Vitamins-Minerals (MULTI FOR HER 50+) TABS Take 1 tablet by  mouth daily in the afternoon.    [provider]  nystatin (MYCOSTATIN/NYSTOP) powder Apply topically 2 (two) times daily. 01/24/2021   Shelly Coss, MD  oxybutynin (DITROPAN) 5 MG tablet Take 5 mg by mouth every other day.    [provider]  polyethylene glycol (MIRALAX / GLYCOLAX) 17 g packet Take 17 g by mouth 2 (two) times daily. 02/13/2021   Shelly Coss, MD  predniSONE (DELTASONE) 20 MG tablet Take 40 mg by mouth daily with breakfast.    [provider]  SANTYL ointment Apply 1 application topically See admin instructions. Apply daily as directed to the affected toe 01/11/21   [provider]  senna (SENOKOT) 8.6 MG TABS tablet Take 2 tablets (17.2 mg total) by mouth daily. 02/07/21   Shelly Coss, MD  zinc oxide 20 % ointment Apply 1 application topically as needed for irritation.    [provider]    Physical Exam: Vitals:   03/05/2021 2052 03/23/2021 2100 03/09/2021 2140 03/21/2021 2154  BP: (!) 149/98 (!) 161/89 (!) 110/96   Pulse: (!) 121 75 (!) 115   Resp: (!) 34 17 (!) 33   Temp:    (!) 101.4 F (38.6 C)  TempSrc:    Rectal  SpO2: 92% 99% 99%     Constitutional: NAD, calm, comfortable Vitals:   03/06/2021 2052 03/19/2021 2100 03/14/2021 2140 02/22/2021 2154  BP: (!) 149/98 (!) 161/89 (!) 110/96   Pulse: (!) 121 75 (!) 115   Resp: (!) 34 17 (!) 33   Temp:    (!) 101.4 F (38.6 C)  TempSrc:    Rectal  SpO2: 92% 99% 99%    General: WDWN, Alert.  Oriented to name.  Does not verbally respond Eyes: EOMI, PERRL, conjunctivae normal.  Sclera nonicteric HENT:  [place]/AT, external ears normal.  Nares patent without epistasis.  Nasal trumpet in place.  Mucous membranes are moist. Posterior pharynx clear of any exudate or lesions. Neck: supple, no masses, no thyromegaly.  Trachea midline Respiratory: Diminished breath sounds.  Mild diffuse Rales.  No rhonchi.  Shallow inspiration.  No wheezing, no crackles.  Tachypnea.  No accessory muscle use.   Cardiovascular: Regular rate and rhythm, no murmurs / rubs / gallops. No extremity edema. 2+ pedal pulses. No carotid bruits.  Abdomen: Soft, patient winces and groans to palpation of abdomen, nondistended, no rebound or guarding.  No masses palpated.  Bowel sounds hyperactive Musculoskeletal: Moves all 4 extremities spontaneously.. no cyanosis.  Normal muscle tone.  Skin: Warm, dry, intact no rashes, lesions, ulcers. No induration Neurologic: Unable to assess accurately due to acute condition, patient not able to participate in exam    Labs on Admission: I have personally reviewed following labs and imaging studies  CBC: Recent Labs  Lab 02/24/21 1015 03/12/2021 1414 02/24/2021 1924  WBC 10.3 12.6* 23.3*  NEUTROABS 8.3* 10.6* 22.6*  HGB 8.0* 7.7* 9.9*  HCT 25.9* 25.4* 32.8*  MCV 90.6 89.8 94.3  PLT 279 267 127*    Basic Metabolic Panel: Recent Labs  Lab 02/24/21 1015 03/22/2021 1414 03/04/2021 1924  NA 131* 132* 137  K 4.2 4.8 4.2  CL 100 101 108  CO2 22 24 16*  GLUCOSE 165* 189* 108*  BUN 32* 31* 45*  CREATININE 1.03* 1.15* 1.61*  CALCIUM 8.9 9.0 8.4*    GFR: Estimated Creatinine Clearance: 19.7 mL/min (A) (by C-G formula based on SCr of 1.61 mg/dL (H)).  Liver Function Tests: Recent Labs  Lab 02/24/21 1015 03/08/2021 1414 03/19/2021 1924  AST 27 49* 72*  ALT 18 20 36  ALKPHOS 67 66 68  BILITOT 1.2 1.0 0.9  PROT 6.2* 6.1* 4.6*  ALBUMIN 3.0* 3.1* 2.4*    Urine analysis:    Component Value Date/Time   COLORURINE YELLOW 01/27/2021 0900   APPEARANCEUR CLEAR 01/27/2021 0900   LABSPEC 1.005 01/27/2021 0900   PHURINE 7.0 01/27/2021 0900   GLUCOSEU NEGATIVE 01/27/2021 0900   HGBUR NEGATIVE 01/27/2021 0900   BILIRUBINUR NEGATIVE 01/27/2021 0900   KETONESUR NEGATIVE 01/27/2021 0900   PROTEINUR NEGATIVE 01/27/2021 0900   UROBILINOGEN 0.2 12/27/2011 2237   NITRITE POSITIVE (A) 01/27/2021 0900   LEUKOCYTESUR LARGE (A) 01/27/2021 0900    Radiological Exams on  Admission: DG Chest Port 1 View  Result Date: 02/22/2021 CLINICAL DATA:  Questionable sepsis, history of hypertension, PF, lung nodules EXAM: PORTABLE CHEST 1 VIEW COMPARISON:  Radiograph 08/17/2018 FINDINGS: Low lung volumes and atelectatic changes. Some coarsened reticular opacities are fairly similar to prior counting for differences in technique and inflation. Postsurgical changes noted in the left upper lung likely for resection of nodule seen on comparison CT imaging. No confluent opacity. No convincing features of edema. No pneumothorax or visible layering effusion. Postsurgical changes related to prior CABG and bioprosthetic aortic valve replacement including intact and aligned sternotomy wires and multiple surgical clips projecting over the mediastinum. The aorta is calcified. The remaining cardiomediastinal contours are unremarkable. Degenerative changes are present in the imaged spine and shoulders. Soft tissue mineralization near the rotator cuff musculature may reflect hydroxyapatite deposition. IMPRESSION: 1. Low lung volumes with atelectatic changes. 2. Postsurgical changes in the left upper lung likely for resection of nodule seen on comparison CT imaging. 3. No acute cardiopulmonary abnormality. 4. Aortic atherosclerosis. 5. Prior sternotomy, CABG and aortic valve replacement. Electronically Signed   By: Lovena Le M.D.   On: 03/11/2021 20:11    EKG: Independently reviewed.  EKG shows sinus tachycardia with PVCs and PACs.  Minimal inferior T wave elevation which is nondiagnostic.  QTc is 432  Assessment/Plan Principal Problem:   Sepsis  Ms. Zawadzki is admitted to telemetry floor.  Patient has sepsis based on fever, leukocytosis, altered mental status. Chest x-ray does not show any infiltrate.  Urinalysis is ordered but pending.  Patient has had multiple loose watery stools today according to the daughter and will check C. difficile and treat if positive. Currently hemodynamically  stable.  She is requiring supplemental oxygen. Will obtain CT of the chest to further evaluate for possible underlying pneumonia. Patient had a stroke a month ago and has been working with speech therapy at skilled nursing facility and is possible she had some aspiration. Been placed on vancomycin and cefepime for empiric antibiotic coverage Lactic acid level was elevated at greater than 10. Continue IV fluid hydration.  Monitor serial lactic acid levels.  Active Problems:   Acute respiratory distress Above oxygen as needed to keep O2 sat greater than 93%.  Obtain CT of the chest for further evaluation for possible underlying pneumonia Duo nebs every few hours as needed for shortness of breath, cough, wheeze.    Chronic diastolic heart failure  Check serial troponin and BNP.  IV fluids have been provided and will monitor fluid status to not overload her    Autoimmune hemolytic anemia  Chronic.  Hemoglobin hematocrit is stable at this time.  Recheck H&H in morning    Paroxysmal atrial fibrillation  Patient is chronically anticoagulated with Eliquis.    Essential hypertension Continue home dose of metoprolol.  Monitor blood pressure.    Leukocytosis WBC is greater than 25,000.  Recheck CBC in morning    Hypoxia , Oxygen provided.  Incentive spirometer every 2 hours while awake.    S/P AVR and CABG   Long term (current) use of anticoagulants     DVT prophylaxis: Pt is on Eliquis for anticoagulation chronically Code Status:   DNR/DNI  Family Communication:  Discussed diagnosis and plan with patient's daughter who is at the bedside.  She confirms CODE STATUS of DNR/DNI.  Questions answered.  Further recommendations to follow as clinically indicated Disposition Plan:   Patient is from:  SNF  Anticipated DC to:  SNF  Anticipated DC date:  Anticipate 2 midnight or more stay in the hospital.  Patient has grim prognosis   Admission status:  Inpatient   Yevonne Aline Chotiner  MD [facility]  How to contact the [facility] Attending or Consulting provider [place] or covering provider during after hours [place], for this patient?   1. Check the care team in [facility] and look for a) attending/consulting [facility] provider listed and b) the [facility] team listed 2. Log into [email or web address] and use [place]'s universal password to access. If you do not have the password, please contact the hospital operator. 3. Locate the [facility] provider you are looking for under [facility] and page to a number that you can be directly reached. 4. If you still have difficulty reaching the provider, please page the [facility] (Director on Call) for the Hospitalists listed on amion for assistance.  03/20/2021, 9:56 PM

## 2021-03-02 NOTE — Assessment & Plan Note (Signed)
Confusion, in hospital, CT head 02/01/21 no acute intracranial abnormality. UTI, acute CVA, underlying dementia may be contributory. F/u neurology.

## 2021-03-02 NOTE — ED Notes (Signed)
Pt is incontinent, pt was shooting out bloody diarrhea. Cleaned pt up and put new brief on.

## 2021-03-02 NOTE — Progress Notes (Signed)
Pharmacy Antibiotic Note  Carol [name] is a 87 y.o. female admitted on 03/16/2021 with sepsis.  Pharmacy has been consulted for Cefepime and Vancomycin dosing.      Temp (24hrs), Avg:99 F (37.2 C), Min:97.7 F (36.5 C), Max:100.3 F (37.9 C)  Recent Labs  Lab 02/24/21 1015 03/21/2021 1414 02/28/2021 1924  WBC 10.3 12.6* 23.3*  CREATININE 1.03* 1.15* 1.61*  LATICACIDVEN  --   --  10.4*    Estimated Creatinine Clearance: 19.7 mL/min (A) (by C-G formula based on SCr of 1.61 mg/dL (H)).    Allergies  Allergen Reactions  . Ibuprofen Other (See Comments)    Hx of GI bleed  . Tapentadol Other (See Comments)    Nucynta- Knows she "cannot take"- made the patient "very sick"   . Ace Inhibitors Cough  . Angiotensin Receptor Blockers Cough  . Dicloxacillin Nausea Only and Other (See Comments)    Made the patient lightheaded, also  . Doxycycline Nausea Only  . Hydrocodone Nausea Only and Other (See Comments)    Made the patient lightheaded, also  . Nsaids     Can tolerate only Tylenol (has a history of GI BLEEDS)  . Adhesive [Tape] Rash    Blisters (can use only paper tape)  . Latex Rash and Other (See Comments)    Paper tape only    Antimicrobials this admission: 3/10 Cefepime >>  3/10 Vancomycin >>   Dose adjustments this admission: N/a  Microbiology results: Pending   Plan:  - Cefepime 2g IV q24h - Vancomycin 1250mg  IV x 1 dose  - Followed by Vancomycin 1000mg  IV q48h - Est Calc AUC 539 - Monitor patients renal function and urine output small increase in Scr from previously  - De-escalate ABX when appropriate   Thank you for allowing pharmacy to be a part of this patient's care.  Duanne Limerick PharmD. BCPS 03/12/2021 8:52 PM

## 2021-03-02 NOTE — Assessment & Plan Note (Signed)
Status post CABG in 2013, has history of severe aortic stenosis status post tissue AVR in 2013. Follows with cardiology.

## 2021-03-02 NOTE — Assessment & Plan Note (Addendum)
takes Gabapentin, Prior lumbar laminectomy 12/2009 Dr. Rolena Infante.  Failed epidural steroids.  Currently on gabapentin

## 2021-03-03 ENCOUNTER — Telehealth: Payer: Self-pay | Admitting: *Deleted

## 2021-03-03 ENCOUNTER — Encounter: Payer: Self-pay | Admitting: Nurse Practitioner

## 2021-03-03 LAB — BLOOD CULTURE ID PANEL (REFLEXED) - BCID2

## 2021-03-03 LAB — PROCALCITONIN: Procalcitonin: 38.73 ng/mL

## 2021-03-03 LAB — C DIFFICILE QUICK SCREEN W PCR REFLEX
C Diff antigen: NEGATIVE
C Diff interpretation: NOT DETECTED
C Diff toxin: NEGATIVE

## 2021-03-03 LAB — LACTIC ACID, PLASMA: Lactic Acid, Venous: >11 mmol/L (ref 0.5–1.9)

## 2021-03-03 MED ORDER — LORAZEPAM 2 MG/ML IJ SOLN: 0.5000 mg | INTRAMUSCULAR | Status: DC

## 2021-03-05 LAB — CULTURE, BLOOD (ROUTINE X 2): Special Requests: ADEQUATE

## 2021-03-07 ENCOUNTER — Ambulatory Visit: Payer: Medicare Other | Admitting: Cardiology

## 2021-03-08 ENCOUNTER — Inpatient Hospital Stay: Payer: Medicare Other | Admitting: Neurology

## 2021-03-14 ENCOUNTER — Other Ambulatory Visit: Payer: Self-pay | Admitting: Cardiology

## 2021-03-17 ENCOUNTER — Ambulatory Visit: Payer: Medicare Other | Admitting: Cardiology

## 2021-03-17 ENCOUNTER — Other Ambulatory Visit: Payer: Medicare Other

## 2021-03-17 ENCOUNTER — Ambulatory Visit: Payer: Medicare Other | Admitting: Nurse Practitioner

## 2021-03-22 ENCOUNTER — Ambulatory Visit: Payer: Medicare Other | Admitting: Nurse Practitioner

## 2021-03-22 ENCOUNTER — Other Ambulatory Visit: Payer: Medicare Other

## 2021-03-24 DIAGNOSIS — 419620001 Death: Secondary | SNOMED CT | POA: Diagnosis not present

## 2021-03-24 NOTE — Progress Notes (Signed)
   03/20/21 0102  Clinical Encounter Type  Visited With Patient and family together  Visit Type Death  Referral From Nurse  Consult/Referral To Chaplain  Spiritual Encounters  Spiritual Needs Emotional;Grief support   Pt Placement: [facility] [employer].    Chaplain responded to page from Pt's nurse, Jazmine, for Pt death. Pt's daughter and granddaughter, [name] and Eldred, respectively, were at bedside. Chaplain engaged active listening and provided grief and emotional support. Chaplain answered Pt's family's questions about patient placement and next steps. Pt's family asked about last rites (the Pt was [place]) and this chaplain explained that the anointing of the sick cannot be performed after death. Chaplain remains available.   This note was prepared by Chaplain Resident, Dante Gang, MDiv. Chaplain remains available as needed through the on-call pager: [phone].

## 2021-03-24 NOTE — Death Summary Note (Signed)
DEATH SUMMARY   Patient Details  Name: Carol [name] MRN: [id number] DOB: [birth year]  Admission/Discharge Information   Admit Date:  March 31, 2021  Date of Death: Date of Death: Apr 01, 2021  Time of Death:    Length of Stay: 1  Referring Physician: Lujean Amel, MD   Reason(s) for Hospitalization  Sepsis  Diagnoses  Preliminary cause of death:  Sepsis Secondary Diagnoses (including complications and co-morbidities):  Principal Problem:   Sepsis ([place]) Active Problems:   Acute respiratory distress   Chronic diastolic heart failure ([place])   Autoimmune hemolytic anemia (HCC)   Paroxysmal atrial fibrillation (H[facility])   Essential hypertension   Leukocytosis   Hypoxia   S/P AVR and CABG   Long term (current) use of anticoagulants   Brief Hospital Course (including significant findings, care, treatment, and services provided and events leading to death)  Carol [name] is a 87 y.o. year old female who medical history significant for stroke, HTN, immune hemolytic anemia, diastolic CHF, CAD, status post aortic valve replacement and CABG, paroxysmal atrial fibrillation who presents from SNF by EMS with report of leukocytosis and fever. According to her daughter, patient was afebrile earlier this afternoon but then suddenly developed a fever.  She has had multiple episodes of loose stool today according to the daughter.Blood work was performed at the facility which does show a increased white blood cell count 25,000. Patient's temperature was 103.6.  She developed respiratory distress and tachypnea.Patient was minimally responsive according to EMS. She did require bag mask valve assistance. Patient is a DNR and does not want mechanical ventilation or chest compressions She did have a stroke a month ago and has been at skilled nursing facility getting the speech and physical therapy rehabilitation.  She is on mechanical soft diet according to the daughter.  Is unknown if she has been  having difficulty with swallowing or had any aspiration event. While in the ER awaiting bed on floor she developed bloody stools. She remained hemodynamically stable over the past few hours.  Around 0050 on 04/01/2021 RN was called into room by family who stated Carol [name] appeared to be struggling to breath. Within 2 minutes she went apneic and was pulseless. She was pronounced at 0053 by RN with family at bedside.  I went to bedside when notified by RN and spoke with family, answered questions and offered condolences.  -Was notified by nursing staff per family request.    Pertinent Labs and Studies  Significant Diagnostic Studies DG Abd 1 View  Result Date: 02/05/2021 CLINICAL DATA:  Abdominal distension EXAM: ABDOMEN - 1 VIEW COMPARISON:  February 01, 2021 FINDINGS: No appreciable bowel dilatation or air-fluid level to suggest bowel obstruction. No free air. Lung bases clear. Postoperative changes noted in the upper abdomen. There is aortic and splenic artery atherosclerotic calcification. IMPRESSION: No bowel obstruction or free air. Lung bases clear. Aortic Atherosclerosis (ICD10-I70.0). Electronically Signed   By: Lowella Grip III M.D.   On: 02/05/2021 09:00   DG Abd 1 View  Result Date: 02/01/2021 CLINICAL DATA:  Abdominal pain and distension EXAM: ABDOMEN - 1 VIEW COMPARISON:  CT abdomen 05/14/2018 FINDINGS: Small bowel pattern is unremarkable. Large amount of gas and stool throughout the colon. Peripheral gas pattern in the rectum presumed to be peripheral gas around impacted stool. The unlikely possibility pneumatosis is not excluded. Arterial vascular calcification is noted throughout. Clips in the right upper quadrant consistent with previous cholecystectomy. Scoliosis and degenerative changes of the spine. IMPRESSION: Large amount of  gas and stool throughout the colon. Peripheral gas pattern in the rectum presumed to be peripheral gas around impacted stool. The unlikely possibility of  pneumatosis is not excluded. If there is clinical concern regarding the possibility bowel infarction, consider CT of the abdomen pelvis. Otherwise, the findings could simply be due to constipation. Electronically Signed   By: Nelson Chimes M.D.   On: 02/01/2021 19:26   CT HEAD WO CONTRAST  Result Date: 02/01/2021 CLINICAL DATA:  Delirium. EXAM: CT HEAD WITHOUT CONTRAST TECHNIQUE: Contiguous axial images were obtained from the base of the skull through the vertex without intravenous contrast. COMPARISON:  Noncontrast head CT 01/27/2021. Brain MRI 01/27/2021. CT angiogram head/neck 01/28/2021. FINDINGS: Brain: Moderate cerebral and cerebellar atrophy. Known early subacute infarct within the left corona radiata/basal ganglia. Background moderate multifocal T2/FLAIR hyperintensity within the cerebral white matter is nonspecific compatible with chronic small vessel ischemic disease. Redemonstrated small early subacute infarct within the left cerebellar hemisphere. Superimposed small chronic infarcts within the bilateral cerebellar hemispheres. There is no acute intracranial hemorrhage. No extra-axial fluid collection. No evidence of intracranial mass. No midline shift. Vascular: No hyperdense vessel.  Atherosclerotic calcifications. Skull: Normal. Negative for fracture or focal lesion. Sinuses/Orbits: Visualized orbits show no acute finding. Trace ethmoid sinus mucosal thickening. IMPRESSION: No evidence of interval acute intracranial abnormality. Known subacute infarcts within the left corona radiata/basal ganglia and left cerebellar hemisphere. Stable background moderate generalized atrophy of the brain and chronic small vessel ischemic disease. Unchanged small remote infarcts within the bilateral cerebellar hemispheres. Electronically Signed   By: Kellie Simmering DO   On: 02/01/2021 14:59   MR BRAIN WO CONTRAST  Result Date: 02/03/2021 CLINICAL DATA:  Delirium.  Recent stroke. EXAM: MRI HEAD WITHOUT CONTRAST  TECHNIQUE: Multiplanar, multiecho pulse sequences of the brain and surrounding structures were obtained without intravenous contrast. COMPARISON:  Head CT 02/01/2021 and MRI 01/27/2021 FINDINGS: Brain: Recent infarcts are again noted in the left corona radiata and left cerebellum. The left corona radiata infarct has slightly enlarged from the prior MRI while the cerebellar infarct is unchanged. No new infarct is identified elsewhere. Background patchy to confluent T2 hyperintensities in the cerebral white matter bilaterally are unchanged and nonspecific but compatible with moderate chronic small-vessel ischemic disease. Chronic bilateral cerebellar infarcts are again noted. There is moderate cerebral atrophy. Vascular: Major intracranial vascular flow voids are preserved. Skull and upper cervical spine: Unremarkable bone marrow signal. Sinuses/Orbits: Bilateral cataract extraction. Paranasal sinuses and mastoid air cells are clear. Other: None. IMPRESSION: 1. Slight enlargement of the left corona radiata infarct since the 01/27/2021 MRI. 2. Unchanged subacute left cerebellar infarct. 3. Moderate chronic small-vessel ischemic disease. Electronically Signed   By: Logan Bores M.D.   On: 02/03/2021 13:04   DG Chest Port 1 View  Result Date: 03/16/2021 CLINICAL DATA:  Questionable sepsis, history of hypertension, PF, lung nodules EXAM: PORTABLE CHEST 1 VIEW COMPARISON:  Radiograph 08/17/2018 FINDINGS: Low lung volumes and atelectatic changes. Some coarsened reticular opacities are fairly similar to prior counting for differences in technique and inflation. Postsurgical changes noted in the left upper lung likely for resection of nodule seen on comparison CT imaging. No confluent opacity. No convincing features of edema. No pneumothorax or visible layering effusion. Postsurgical changes related to prior CABG and bioprosthetic aortic valve replacement including intact and aligned sternotomy wires and multiple surgical  clips projecting over the mediastinum. The aorta is calcified. The remaining cardiomediastinal contours are unremarkable. Degenerative changes are present in the imaged spine and  shoulders. Soft tissue mineralization near the rotator cuff musculature may reflect hydroxyapatite deposition. IMPRESSION: 1. Low lung volumes with atelectatic changes. 2. Postsurgical changes in the left upper lung likely for resection of nodule seen on comparison CT imaging. 3. No acute cardiopulmonary abnormality. 4. Aortic atherosclerosis. 5. Prior sternotomy, CABG and aortic valve replacement. Electronically Signed   By: Lovena Le M.D.   On: 03/13/2021 20:11    Microbiology Recent Results (from the past 240 hour(s))  Resp Panel by RT-PCR (Flu A&B, Covid) Nasopharyngeal Swab     Status: None   Collection Time: 03/02/2021  7:49 PM   Specimen: Nasopharyngeal Swab; Nasopharyngeal(NP) swabs in vial transport medium  Result Value Ref Range Status   SARS Coronavirus 2 by RT PCR NEGATIVE NEGATIVE Final    Comment: (NOTE) SARS-CoV-2 target nucleic acids are NOT DETECTED.  The SARS-CoV-2 RNA is generally detectable in upper respiratory specimens during the acute phase of infection. The lowest concentration of SARS-CoV-2 viral copies this assay can detect is 138 copies/mL. A negative result does not preclude SARS-Cov-2 infection and should not be used as the sole basis for treatment or other patient management decisions. A negative result may occur with  improper specimen collection/handling, submission of specimen other than nasopharyngeal swab, presence of viral mutation(s) within the areas targeted by this assay, and inadequate number of viral copies(<138 copies/mL). A negative result must be combined with clinical observations, patient history, and epidemiological information. The expected result is Negative.  Fact Sheet for Patients:  EntrepreneurPulse.com.au  Fact Sheet for Healthcare  Providers:  IncredibleEmployment.be  This test is no t yet approved or cleared by the [place] FDA and  has been authorized for detection and/or diagnosis of SARS-CoV-2 by FDA under an Emergency Use Authorization (EUA). This EUA will remain  in effect (meaning this test can be used) for the duration of the COVID-19 declaration under Section 564(b)(1) of the Act, 21 U.S.C.section 360bbb-3(b)(1), unless the authorization is terminated  or revoked sooner.       Influenza A by PCR NEGATIVE NEGATIVE Final   Influenza B by PCR NEGATIVE NEGATIVE Final    Comment: (NOTE) The Xpert Xpress SARS-CoV-2/FLU/RSV plus assay is intended as an aid in the diagnosis of influenza from Nasopharyngeal swab specimens and should not be used as a sole basis for treatment. Nasal washings and aspirates are unacceptable for Xpert Xpress SARS-CoV-2/FLU/RSV testing.  Fact Sheet for Patients: EntrepreneurPulse.com.au  Fact Sheet for Healthcare Providers: IncredibleEmployment.be  This test is not yet approved or cleared by the [place] FDA and has been authorized for detection and/or diagnosis of SARS-CoV-2 by FDA under an Emergency Use Authorization (EUA). This EUA will remain in effect (meaning this test can be used) for the duration of the COVID-19 declaration under Section 564(b)(1) of the Act, 21 U.S.C. section 360bbb-3(b)(1), unless the authorization is terminated or revoked.  Performed at [facility], [place] [address], [place], [place] [address]     Lab Basic Metabolic Panel: Recent Labs  Lab 02/24/21 1015 03/18/2021 1414 03/12/2021 1924  NA 131* 132* 137  K 4.2 4.8 4.2  CL 100 101 108  CO2 22 24 16*  GLUCOSE 165* 189* 108*  BUN 32* 31* 45*  CREATININE 1.03* 1.15* 1.61*  CALCIUM 8.9 9.0 8.4*   Liver Function Tests: Recent Labs  Lab 02/24/21 1015 03/07/2021 1414 02/23/2021 1924  AST 27 49* 72*  ALT 18 20 36  ALKPHOS 67  66 68  BILITOT 1.2 1.0 0.9  PROT 6.2* 6.1* 4.6*  ALBUMIN 3.0* 3.1* 2.4*   No results for input(s): LIPASE, AMYLASE in the last 168 hours. No results for input(s): AMMONIA in the last 168 hours. CBC: Recent Labs  Lab 02/24/21 1015 03/18/2021 1414 03/01/2021 1924  WBC 10.3 12.6* 23.3*  NEUTROABS 8.3* 10.6* 22.6*  HGB 8.0* 7.7* 9.9*  HCT 25.9* 25.4* 32.8*  MCV 90.6 89.8 94.3  PLT 279 267 127*   Cardiac Enzymes: No results for input(s): CKTOTAL, CKMB, CKMBINDEX, TROPONINI in the last 168 hours. Sepsis Labs: Recent Labs  Lab 02/24/21 1015 03/08/2021 1414 02/25/2021 1924 03/07/2021 2124 03/10/2021 2300 03/05/2021 2350  PROCALCITON  --   --   --   --   --  38.73  WBC 10.3 12.6* 23.3*  --   --   --   LATICACIDVEN  --   --  10.4* >11.0* >11.0*  --      Yevonne Aline Naji Mehringer 03-05-2021, 1:28 AM

## 2021-03-24 NOTE — Telephone Encounter (Signed)
Notified Dr. Benay Spice that patient died today

## 2021-03-24 NOTE — ED Notes (Signed)
Chaplain notified per family request.

## 2021-03-24 NOTE — ED Notes (Signed)
This RN was called into room by family as pt family stated on the monitor pt was apneic. Then about 2 mins pt went asystole on the monitor, this rn attempted to feel for a pulse and listen for an HR, no sound or pulse felt. This RN notified primary nurse and ER MD and hospitalist  to call time of death @ [phone])

## 2021-03-24 NOTE — ED Notes (Signed)
Family provided napkins and beverages and told if they need anything to let rn know

## 2021-03-24 DEATH — deceased
